# Patient Record
Sex: Female | Born: 1950 | Race: White | Hispanic: No | Smoking: Current every day smoker
Health system: Southern US, Community
[De-identification: ages and names within clinical notes are randomized; demographics above are authoritative.]

## PROBLEM LIST (undated history)

## (undated) DIAGNOSIS — M75100 Unspecified rotator cuff tear or rupture of unspecified shoulder, not specified as traumatic: Secondary | ICD-10-CM

## (undated) DIAGNOSIS — K5090 Crohn's disease, unspecified, without complications: Secondary | ICD-10-CM

## (undated) DIAGNOSIS — M75122 Complete rotator cuff tear or rupture of left shoulder, not specified as traumatic: Secondary | ICD-10-CM

## (undated) DIAGNOSIS — R3129 Other microscopic hematuria: Secondary | ICD-10-CM

## (undated) DIAGNOSIS — K2980 Duodenitis without bleeding: Secondary | ICD-10-CM

## (undated) DIAGNOSIS — F419 Anxiety disorder, unspecified: Secondary | ICD-10-CM

## (undated) DIAGNOSIS — R32 Unspecified urinary incontinence: Secondary | ICD-10-CM

## (undated) DIAGNOSIS — I509 Heart failure, unspecified: Secondary | ICD-10-CM

## (undated) DIAGNOSIS — J441 Chronic obstructive pulmonary disease with (acute) exacerbation: Secondary | ICD-10-CM

## (undated) DIAGNOSIS — M1990 Unspecified osteoarthritis, unspecified site: Secondary | ICD-10-CM

## (undated) DIAGNOSIS — K602 Anal fissure, unspecified: Secondary | ICD-10-CM

## (undated) DIAGNOSIS — Z9289 Personal history of other medical treatment: Secondary | ICD-10-CM

## (undated) DIAGNOSIS — D649 Anemia, unspecified: Secondary | ICD-10-CM

## (undated) DIAGNOSIS — C182 Malignant neoplasm of ascending colon: Secondary | ICD-10-CM

## (undated) DIAGNOSIS — K219 Gastro-esophageal reflux disease without esophagitis: Secondary | ICD-10-CM

## (undated) DIAGNOSIS — K56609 Unspecified intestinal obstruction, unspecified as to partial versus complete obstruction: Secondary | ICD-10-CM

## (undated) DIAGNOSIS — E785 Hyperlipidemia, unspecified: Secondary | ICD-10-CM

## (undated) DIAGNOSIS — M545 Low back pain, unspecified: Secondary | ICD-10-CM

## (undated) DIAGNOSIS — I10 Essential (primary) hypertension: Secondary | ICD-10-CM

## (undated) DIAGNOSIS — J439 Emphysema, unspecified: Secondary | ICD-10-CM

## (undated) DIAGNOSIS — T7840XA Allergy, unspecified, initial encounter: Secondary | ICD-10-CM

## (undated) DIAGNOSIS — I5181 Takotsubo syndrome: Secondary | ICD-10-CM

## (undated) HISTORY — PX: ECTOPIC PREGNANCY SURGERY: SHX613

## (undated) HISTORY — DX: Gastro-esophageal reflux disease without esophagitis: K21.9

## (undated) HISTORY — PX: BLADDER SUSPENSION: SHX72

## (undated) HISTORY — DX: Anemia, unspecified: D64.9

## (undated) HISTORY — DX: Heart failure, unspecified: I50.9

## (undated) HISTORY — DX: Emphysema, unspecified: J43.9

## (undated) HISTORY — DX: Anal fissure, unspecified: K60.2

## (undated) HISTORY — DX: Low back pain: M54.5

## (undated) HISTORY — DX: Personal history of other medical treatment: Z92.89

## (undated) HISTORY — PX: ESOPHAGOGASTRODUODENOSCOPY: SHX1529

## (undated) HISTORY — DX: Unspecified osteoarthritis, unspecified site: M19.90

## (undated) HISTORY — DX: Unspecified intestinal obstruction, unspecified as to partial versus complete obstruction: K56.609

## (undated) HISTORY — DX: Allergy, unspecified, initial encounter: T78.40XA

## (undated) HISTORY — DX: Takotsubo syndrome: I51.81

## (undated) HISTORY — DX: Unspecified urinary incontinence: R32

## (undated) HISTORY — PX: FACIAL COSMETIC SURGERY: SHX629

## (undated) HISTORY — DX: Other microscopic hematuria: R31.29

## (undated) HISTORY — PX: UTERINE SUSPENSION: SUR1430

## (undated) HISTORY — DX: Essential (primary) hypertension: I10

## (undated) HISTORY — DX: Crohn's disease, unspecified, without complications: K50.90

## (undated) HISTORY — DX: Anxiety disorder, unspecified: F41.9

## (undated) HISTORY — DX: Hyperlipidemia, unspecified: E78.5

## (undated) HISTORY — DX: Low back pain, unspecified: M54.50

## (undated) HISTORY — PX: ORBITAL FRACTURE SURGERY: SHX725

---
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
## 1979-04-04 HISTORY — PX: KNEE SURGERY: SHX244

## 1988-04-03 HISTORY — PX: ORBITAL FRACTURE SURGERY: SHX725

## 1989-04-03 HISTORY — PX: HAND SURGERY: SHX662

## 1992-04-03 HISTORY — PX: ABDOMINAL HYSTERECTOMY: SHX81

## 1993-04-03 HISTORY — PX: CHOLECYSTECTOMY: SHX55

## 2003-06-05 ENCOUNTER — Encounter: Payer: Self-pay | Admitting: Internal Medicine

## 2003-06-05 ENCOUNTER — Encounter: Admission: RE | Admit: 2003-06-05 | Discharge: 2003-06-05 | Payer: Self-pay | Admitting: Family Medicine

## 2003-10-19 ENCOUNTER — Inpatient Hospital Stay (HOSPITAL_COMMUNITY): Admission: EM | Admit: 2003-10-19 | Discharge: 2003-10-22 | Payer: Self-pay | Admitting: Emergency Medicine

## 2008-04-03 DIAGNOSIS — I219 Acute myocardial infarction, unspecified: Secondary | ICD-10-CM

## 2008-04-03 HISTORY — PX: LYSIS OF ADHESION: SHX5961

## 2008-04-03 HISTORY — DX: Acute myocardial infarction, unspecified: I21.9

## 2008-04-09 ENCOUNTER — Ambulatory Visit: Payer: Self-pay | Admitting: Internal Medicine

## 2008-04-22 ENCOUNTER — Inpatient Hospital Stay (HOSPITAL_COMMUNITY): Admission: EM | Admit: 2008-04-22 | Discharge: 2008-04-29 | Payer: Self-pay | Admitting: Emergency Medicine

## 2008-12-02 DIAGNOSIS — I5181 Takotsubo syndrome: Secondary | ICD-10-CM

## 2008-12-02 HISTORY — DX: Takotsubo syndrome: I51.81

## 2009-01-13 ENCOUNTER — Ambulatory Visit: Payer: Self-pay | Admitting: Internal Medicine

## 2009-01-27 ENCOUNTER — Ambulatory Visit: Payer: Self-pay | Admitting: Internal Medicine

## 2009-01-27 ENCOUNTER — Encounter: Payer: Self-pay | Admitting: Internal Medicine

## 2009-01-29 ENCOUNTER — Telehealth: Payer: Self-pay | Admitting: Internal Medicine

## 2009-02-01 ENCOUNTER — Ambulatory Visit: Payer: Self-pay | Admitting: Internal Medicine

## 2009-02-02 ENCOUNTER — Ambulatory Visit: Payer: Self-pay | Admitting: Internal Medicine

## 2009-02-02 ENCOUNTER — Telehealth: Payer: Self-pay | Admitting: Internal Medicine

## 2009-02-02 ENCOUNTER — Encounter: Payer: Self-pay | Admitting: Internal Medicine

## 2009-02-03 ENCOUNTER — Telehealth: Payer: Self-pay | Admitting: Internal Medicine

## 2009-02-03 LAB — CONVERTED CEMR LAB
ALT: 10 units/L (ref 0–35)
AST: 18 units/L (ref 0–37)
Albumin: 4 g/dL (ref 3.5–5.2)
Alkaline Phosphatase: 69 units/L (ref 39–117)
BUN: 10 mg/dL (ref 6–23)
Basophils Absolute: 0 10*3/uL (ref 0.0–0.1)
Basophils Relative: 0.1 % (ref 0.0–3.0)
CEA: 5.1 ng/mL — ABNORMAL HIGH (ref 0.0–5.0)
CO2: 29 meq/L (ref 19–32)
Calcium: 9.4 mg/dL (ref 8.4–10.5)
Chloride: 108 meq/L (ref 96–112)
Creatinine, Ser: 0.8 mg/dL (ref 0.4–1.2)
Eosinophils Absolute: 0.1 10*3/uL (ref 0.0–0.7)
Eosinophils Relative: 1 % (ref 0.0–5.0)
GFR calc non Af Amer: 78.06 mL/min (ref >60)
Glucose, Bld: 108 mg/dL — ABNORMAL HIGH (ref 70–99)
HCT: 39.7 % (ref 36.0–46.0)
Hemoglobin: 13.7 g/dL (ref 12.0–15.0)
Lymphocytes Relative: 25 % (ref 12.0–46.0)
Lymphs Abs: 1.8 10*3/uL (ref 0.7–4.0)
MCHC: 34.6 g/dL (ref 30.0–36.0)
MCV: 94.3 fL (ref 78.0–100.0)
Monocytes Absolute: 0.5 10*3/uL (ref 0.1–1.0)
Monocytes Relative: 7.4 % (ref 3.0–12.0)
Neutro Abs: 4.7 10*3/uL (ref 1.4–7.7)
Neutrophils Relative %: 66.5 % (ref 43.0–77.0)
Platelets: 218 10*3/uL (ref 150.0–400.0)
Potassium: 4.8 meq/L (ref 3.5–5.1)
RBC: 4.21 M/uL (ref 3.87–5.11)
RDW: 12.7 % (ref 11.5–14.6)
Sodium: 142 meq/L (ref 135–145)
Total Bilirubin: 0.5 mg/dL (ref 0.3–1.2)
Total Protein: 6.8 g/dL (ref 6.0–8.3)
WBC: 7.1 10*3/uL (ref 4.5–10.5)

## 2009-02-10 ENCOUNTER — Encounter (INDEPENDENT_AMBULATORY_CARE_PROVIDER_SITE_OTHER): Payer: Self-pay | Admitting: Internal Medicine

## 2009-02-10 ENCOUNTER — Inpatient Hospital Stay (HOSPITAL_COMMUNITY): Admission: EM | Admit: 2009-02-10 | Discharge: 2009-02-13 | Payer: Self-pay | Admitting: Emergency Medicine

## 2009-02-10 ENCOUNTER — Ambulatory Visit: Payer: Self-pay | Admitting: Infectious Diseases

## 2009-02-11 ENCOUNTER — Encounter: Payer: Self-pay | Admitting: Internal Medicine

## 2009-02-12 ENCOUNTER — Encounter: Payer: Self-pay | Admitting: Internal Medicine

## 2009-02-12 DIAGNOSIS — I5181 Takotsubo syndrome: Secondary | ICD-10-CM | POA: Insufficient documentation

## 2009-02-18 ENCOUNTER — Encounter: Payer: Self-pay | Admitting: Internal Medicine

## 2009-03-02 ENCOUNTER — Encounter: Payer: Self-pay | Admitting: Internal Medicine

## 2009-03-02 LAB — CONVERTED CEMR LAB
ALT: 15 units/L
AST: 17 units/L
Albumin: 3.8 g/dL
Alkaline Phosphatase: 66 units/L
BUN: 14 mg/dL
Basophils Relative: 1 %
CO2: 28 meq/L
Calcium: 9.6 mg/dL
Chloride: 107 meq/L
Creatinine, Ser: 0.8 mg/dL
Eosinophils Relative: 2 %
Glucose, Bld: 94 mg/dL
HCT: 38 %
Hemoglobin: 12.6 g/dL
Lymphocytes, automated: 36 %
MCV: 92.3 fL
Monocytes Relative: 8 %
Neutrophils Relative %: 54 %
Platelets: 217 10*3/uL
Potassium: 4.1 meq/L
RBC: 4.12 M/uL
RDW: 12.8 %
Sodium: 141 meq/L
Total Bilirubin: 0.5 mg/dL
Total Protein: 6.8 g/dL
WBC: 7.3 10*3/uL

## 2009-03-03 ENCOUNTER — Ambulatory Visit: Payer: Self-pay | Admitting: Internal Medicine

## 2009-03-03 ENCOUNTER — Encounter: Payer: Self-pay | Admitting: Internal Medicine

## 2009-03-03 HISTORY — PX: RIGHT COLECTOMY: SHX853

## 2009-03-03 LAB — CONVERTED CEMR LAB
BUN: 14 mg/dL (ref 6–23)
CO2: 24 meq/L (ref 19–32)
Calcium: 9.3 mg/dL (ref 8.4–10.5)
Chloride: 106 meq/L (ref 96–112)
Creatinine, Ser: 0.8 mg/dL (ref 0.40–1.20)
Glucose, Bld: 92 mg/dL (ref 70–99)
Potassium: 4.7 meq/L (ref 3.5–5.3)
Sodium: 141 meq/L (ref 135–145)

## 2009-03-05 ENCOUNTER — Encounter (INDEPENDENT_AMBULATORY_CARE_PROVIDER_SITE_OTHER): Payer: Self-pay | Admitting: General Surgery

## 2009-03-05 ENCOUNTER — Inpatient Hospital Stay (HOSPITAL_COMMUNITY): Admission: RE | Admit: 2009-03-05 | Discharge: 2009-03-08 | Payer: Self-pay | Admitting: General Surgery

## 2009-03-05 ENCOUNTER — Encounter (INDEPENDENT_AMBULATORY_CARE_PROVIDER_SITE_OTHER): Payer: Self-pay | Admitting: *Deleted

## 2009-03-08 ENCOUNTER — Encounter (INDEPENDENT_AMBULATORY_CARE_PROVIDER_SITE_OTHER): Payer: Self-pay | Admitting: *Deleted

## 2009-03-10 ENCOUNTER — Inpatient Hospital Stay (HOSPITAL_COMMUNITY): Admission: EM | Admit: 2009-03-10 | Discharge: 2009-03-12 | Payer: Self-pay | Admitting: Emergency Medicine

## 2009-03-13 ENCOUNTER — Inpatient Hospital Stay (HOSPITAL_COMMUNITY): Admission: EM | Admit: 2009-03-13 | Discharge: 2009-03-18 | Payer: Self-pay | Admitting: Emergency Medicine

## 2009-03-18 ENCOUNTER — Encounter (INDEPENDENT_AMBULATORY_CARE_PROVIDER_SITE_OTHER): Payer: Self-pay | Admitting: *Deleted

## 2009-03-19 ENCOUNTER — Encounter (INDEPENDENT_AMBULATORY_CARE_PROVIDER_SITE_OTHER): Payer: Self-pay | Admitting: *Deleted

## 2009-03-19 ENCOUNTER — Encounter: Admission: RE | Admit: 2009-03-19 | Discharge: 2009-03-19 | Payer: Self-pay | Admitting: General Surgery

## 2009-03-19 ENCOUNTER — Encounter: Payer: Self-pay | Admitting: Internal Medicine

## 2009-03-19 ENCOUNTER — Inpatient Hospital Stay (HOSPITAL_COMMUNITY): Admission: AD | Admit: 2009-03-19 | Discharge: 2009-03-23 | Payer: Self-pay | Admitting: General Surgery

## 2009-03-19 LAB — CONVERTED CEMR LAB
ALT: 86 units/L
AST: 21 units/L
Albumin: 3.5 g/dL
Alkaline Phosphatase: 103 units/L
BUN: 3 mg/dL
CO2: 28 meq/L
Calcium: 9 mg/dL
Chloride: 101 meq/L
Creatinine, Ser: 0.92 mg/dL
Glucose, Bld: 106 mg/dL
HCT: 35.3 %
Hemoglobin: 11.5 g/dL
MCV: 91.4 fL
Platelets: 520 10*3/uL
Potassium: 3.1 meq/L
RBC: 3.86 M/uL
RDW: 12.6 %
Sodium: 139 meq/L
Total Bilirubin: 0.1 mg/dL
Total Protein: 6.6 g/dL
WBC: 7.9 10*3/uL

## 2009-03-23 ENCOUNTER — Encounter (INDEPENDENT_AMBULATORY_CARE_PROVIDER_SITE_OTHER): Payer: Self-pay | Admitting: *Deleted

## 2009-03-23 ENCOUNTER — Telehealth (INDEPENDENT_AMBULATORY_CARE_PROVIDER_SITE_OTHER): Payer: Self-pay | Admitting: *Deleted

## 2009-04-01 ENCOUNTER — Ambulatory Visit: Payer: Self-pay | Admitting: Genetic Counselor

## 2009-04-14 ENCOUNTER — Encounter: Payer: Self-pay | Admitting: Internal Medicine

## 2009-04-19 ENCOUNTER — Telehealth: Payer: Self-pay | Admitting: Internal Medicine

## 2009-04-21 ENCOUNTER — Telehealth: Payer: Self-pay | Admitting: Internal Medicine

## 2009-06-22 ENCOUNTER — Ambulatory Visit: Payer: Self-pay | Admitting: Genetic Counselor

## 2010-01-05 ENCOUNTER — Encounter: Payer: Self-pay | Admitting: Internal Medicine

## 2010-01-11 ENCOUNTER — Encounter (INDEPENDENT_AMBULATORY_CARE_PROVIDER_SITE_OTHER): Payer: Self-pay | Admitting: *Deleted

## 2010-01-14 ENCOUNTER — Ambulatory Visit: Payer: Self-pay | Admitting: Internal Medicine

## 2010-01-17 ENCOUNTER — Encounter (INDEPENDENT_AMBULATORY_CARE_PROVIDER_SITE_OTHER): Payer: Self-pay | Admitting: *Deleted

## 2010-01-25 ENCOUNTER — Ambulatory Visit: Payer: Self-pay | Admitting: Internal Medicine

## 2010-01-25 LAB — HM COLONOSCOPY

## 2010-01-31 ENCOUNTER — Telehealth: Payer: Self-pay | Admitting: Internal Medicine

## 2010-02-01 DIAGNOSIS — K5090 Crohn's disease, unspecified, without complications: Secondary | ICD-10-CM

## 2010-02-01 HISTORY — DX: Crohn's disease, unspecified, without complications: K50.90

## 2010-02-03 ENCOUNTER — Telehealth: Payer: Self-pay | Admitting: Internal Medicine

## 2010-02-04 ENCOUNTER — Encounter: Payer: Self-pay | Admitting: Internal Medicine

## 2010-02-04 LAB — HM MAMMOGRAPHY: HM Mammogram: NORMAL

## 2010-02-14 ENCOUNTER — Encounter: Payer: Self-pay | Admitting: Internal Medicine

## 2010-02-14 DIAGNOSIS — I10 Essential (primary) hypertension: Secondary | ICD-10-CM | POA: Insufficient documentation

## 2010-02-16 ENCOUNTER — Ambulatory Visit: Payer: Self-pay | Admitting: Internal Medicine

## 2010-02-16 DIAGNOSIS — M545 Low back pain, unspecified: Secondary | ICD-10-CM | POA: Insufficient documentation

## 2010-02-16 DIAGNOSIS — M25569 Pain in unspecified knee: Secondary | ICD-10-CM | POA: Insufficient documentation

## 2010-02-16 DIAGNOSIS — I252 Old myocardial infarction: Secondary | ICD-10-CM | POA: Insufficient documentation

## 2010-02-16 DIAGNOSIS — K219 Gastro-esophageal reflux disease without esophagitis: Secondary | ICD-10-CM | POA: Insufficient documentation

## 2010-02-16 DIAGNOSIS — R32 Unspecified urinary incontinence: Secondary | ICD-10-CM | POA: Insufficient documentation

## 2010-02-16 DIAGNOSIS — M1990 Unspecified osteoarthritis, unspecified site: Secondary | ICD-10-CM | POA: Insufficient documentation

## 2010-02-16 DIAGNOSIS — Z860100 Personal history of colon polyps, unspecified: Secondary | ICD-10-CM | POA: Insufficient documentation

## 2010-02-16 DIAGNOSIS — E785 Hyperlipidemia, unspecified: Secondary | ICD-10-CM | POA: Insufficient documentation

## 2010-02-16 DIAGNOSIS — D649 Anemia, unspecified: Secondary | ICD-10-CM | POA: Insufficient documentation

## 2010-02-16 DIAGNOSIS — Z87442 Personal history of urinary calculi: Secondary | ICD-10-CM | POA: Insufficient documentation

## 2010-02-17 LAB — CONVERTED CEMR LAB
BUN: 23 mg/dL (ref 6–23)
Basophils Absolute: 0.1 10*3/uL (ref 0.0–0.1)
Basophils Relative: 0.7 % (ref 0.0–3.0)
CO2: 28 meq/L (ref 19–32)
Calcium: 10 mg/dL (ref 8.4–10.5)
Chloride: 108 meq/L (ref 96–112)
Cholesterol: 225 mg/dL — ABNORMAL HIGH (ref 0–200)
Creatinine, Ser: 1.1 mg/dL (ref 0.4–1.2)
Direct LDL: 147.5 mg/dL
Eosinophils Absolute: 0.2 10*3/uL (ref 0.0–0.7)
Eosinophils Relative: 2.5 % (ref 0.0–5.0)
Folate: 12 ng/mL
GFR calc non Af Amer: 51.69 mL/min (ref >60)
Glucose, Bld: 102 mg/dL — ABNORMAL HIGH (ref 70–99)
HCT: 36.5 % (ref 36.0–46.0)
HDL: 53.4 mg/dL (ref >39.00)
Hemoglobin: 12.5 g/dL (ref 12.0–15.0)
Iron: 62 ug/dL (ref 42–145)
Lymphocytes Relative: 28 % (ref 12.0–46.0)
Lymphs Abs: 2.7 10*3/uL (ref 0.7–4.0)
MCHC: 34.1 g/dL (ref 30.0–36.0)
MCV: 91.7 fL (ref 78.0–100.0)
Monocytes Absolute: 0.7 10*3/uL (ref 0.1–1.0)
Monocytes Relative: 7.2 % (ref 3.0–12.0)
Neutro Abs: 5.8 10*3/uL (ref 1.4–7.7)
Neutrophils Relative %: 61.6 % (ref 43.0–77.0)
Platelets: 262 10*3/uL (ref 150.0–400.0)
Potassium: 5.3 meq/L — ABNORMAL HIGH (ref 3.5–5.1)
RBC: 3.99 M/uL (ref 3.87–5.11)
RDW: 13.2 % (ref 11.5–14.6)
Saturation Ratios: 16.2 % — ABNORMAL LOW (ref 20.0–50.0)
Sodium: 144 meq/L (ref 135–145)
Total CHOL/HDL Ratio: 4
Transferrin: 273.6 mg/dL (ref 212.0–360.0)
Triglycerides: 171 mg/dL — ABNORMAL HIGH (ref 0.0–149.0)
VLDL: 34.2 mg/dL (ref 0.0–40.0)
Vitamin B-12: 300 pg/mL (ref 211–911)
WBC: 9.5 10*3/uL (ref 4.5–10.5)

## 2010-02-18 ENCOUNTER — Ambulatory Visit: Payer: Self-pay | Admitting: Internal Medicine

## 2010-02-18 DIAGNOSIS — C182 Malignant neoplasm of ascending colon: Secondary | ICD-10-CM

## 2010-02-18 DIAGNOSIS — Z85038 Personal history of other malignant neoplasm of large intestine: Secondary | ICD-10-CM | POA: Insufficient documentation

## 2010-02-18 HISTORY — DX: Malignant neoplasm of ascending colon: C18.2

## 2010-02-28 ENCOUNTER — Ambulatory Visit: Payer: Self-pay | Admitting: Internal Medicine

## 2010-03-03 HISTORY — PX: TOTAL KNEE ARTHROPLASTY: SHX125

## 2010-03-11 ENCOUNTER — Encounter: Payer: Self-pay | Admitting: Internal Medicine

## 2010-03-22 ENCOUNTER — Encounter: Payer: Self-pay | Admitting: Internal Medicine

## 2010-03-23 ENCOUNTER — Ambulatory Visit: Payer: Self-pay | Admitting: Internal Medicine

## 2010-03-23 ENCOUNTER — Encounter: Payer: Self-pay | Admitting: Internal Medicine

## 2010-03-29 ENCOUNTER — Inpatient Hospital Stay (HOSPITAL_COMMUNITY)
Admission: RE | Admit: 2010-03-29 | Discharge: 2010-03-30 | Payer: Self-pay | Source: Home / Self Care | Attending: Orthopedic Surgery | Admitting: Orthopedic Surgery

## 2010-04-29 ENCOUNTER — Ambulatory Visit
Admission: RE | Admit: 2010-04-29 | Discharge: 2010-04-29 | Payer: Self-pay | Source: Home / Self Care | Attending: Internal Medicine | Admitting: Internal Medicine

## 2010-05-02 ENCOUNTER — Telehealth (INDEPENDENT_AMBULATORY_CARE_PROVIDER_SITE_OTHER): Payer: Self-pay

## 2010-05-03 NOTE — Procedures (Signed)
Summary: Colonoscopy  Patient: Erica [name] Note: All result statuses are Final unless otherwise noted.  Tests: (1) Colonoscopy (COL)   COL Colonoscopy           [facility] [employer] [facility] [employer].     [place], [place]  [address]           COLONOSCOPY PROCEDURE REPORT           PATIENT:  Erica, [name]  MR#:  [id number]     BIRTHDATE:  [birth year], 74 yrs. old  GENDER:  female     ENDOSCOPIST:  Gatha Mayer, MD, [facility]     REF. BY:     PROCEDURE DATE:  01/25/2010     PROCEDURE:  Colonoscopy with biopsy and snare polypectomy     ASA CLASS:  Class II     INDICATIONS:  surveillance and high-risk screening, history of     colon cancer, family history of colon cancer, family Hx of polyps     T1 N0 adenocarcinoma of right colon resected 03/2009     sister with colon cancer (50's) father with colon cancer (86's)           personal hx of polyps     MEDICATIONS:   Fentanyl 75 mcg IV, Versed 7 mg           DESCRIPTION OF PROCEDURE:   After the risks benefits and     alternatives of the procedure were thoroughly explained, informed     consent was obtained.  Digital rectal exam was performed and     revealed no abnormalities.   The LB 180AL [id number] and LB     PCF-H180AL [id number] endoscope was introduced through the anus and     advanced to the ileum, without limitations.  The quality of the     prep was excellent, using MoviPrep.  The instrument was then     slowly withdrawn as the colon was fully examined. Insertion: 2:42     minutes withdrawal: 11:32 minutes     <<PROCEDUREIMAGES>>           FINDINGS:  A diminutive polyp was found at the splenic flexure. It     was 4 - 5 mm in size. Polyp was snared without cautery. Retrieval     was successful. snare polyp  Ulcers in the ileum. Small,     serpiginous ulcers seen - multiple. Multiple biopsies were     obtained and sent to pathology. Ulcers in the sigmoid colon.     Multiple tiny ulcers. Multiple biopsies were  obtained and sent to     pathology.  This was otherwise a normal examination of the colon.     Retroflexed views in the rectum revealed internal hemorrhoids.     The scope was then withdrawn from the patient and the procedure     completed.           COMPLICATIONS:  None     ENDOSCOPIC IMPRESSION:     1) 4 - 5 mm diminutive polyp at the splenic flexure - removed     2) Ulcers in the ileum - ? Crohn's - biopsied     3) Ulcers in the sigmoid colon - ? Crohn's vs. prep effect -     biopsied     4) Internal hemorrhoids     5) Otherwise normal examination     6) Personal history of T1N0 colon cancer resected  03/2009 + family     hx colon cancer sister and father     RECOMMENDATIONS:     1) Await biopsy results     I will call her with results.     Also need to look into genetics testing (started last year, ? if     completed).           REPEAT EXAM:  In for Colonoscopy, pending biopsy results.           Gatha Mayer, MD, [facility]           CC:  Lavone Orn, MD     Excell Seltzer, MD     The Patient           n.     eSIGNED:   Gatha Mayer at 01/25/2010 02:52 PM           [name], [id number]  Note: An exclamation mark (!) indicates a result that was not dispersed into the flowsheet. Document Creation Date: 01/25/2010 2:53 PM _______________________________________________________________________  (1) Order result status: Final Collection or observation date-time: 01/25/2010 14:35 Requested date-time:  Receipt date-time:  Reported date-time:  Referring Physician:   Ordering Physician: Silvano Rusk [phone]) Specimen Source:  Source: Tawanna Cooler Order Number: [id number] Lab site:   Appended Document: Colonoscopy     Procedures Next Due Date:    Colonoscopy: 02/2012

## 2010-05-03 NOTE — Letter (Signed)
Summary: [facility]   Imported By: Phillis Knack 05/03/2009 10:03:23  _____________________________________________________________________  External Attachment:    Type:   Image     Comment:   External Document

## 2010-05-03 NOTE — Letter (Signed)
Summary: Pre Visit Letter Revised  [facility]  [place],  [place] [address]   Phone: [phone]  Fax: [phone]        01/05/2010 MRN: [id number]  [facility] [address] [place],   [address]             Procedure Date:  10-25 at 2pm   Welcome to the Gastroenterology Division at [facility].    You are scheduled to see a nurse for your pre-procedure visit on 01-14-10 at 9am on the 3rd floor at [facility], [place] [employer].  We ask that you try to arrive at our office 15 minutes prior to your appointment time to allow for check-in.  Please take a minute to review the attached form.  If you answer "Yes" to one or more of the questions on the first page, we ask that you call the person listed at your earliest opportunity.  If you answer "No" to all of the questions, please complete the rest of the form and bring it to your appointment.    Your nurse visit will consist of discussing your medical and surgical history, your immediate family medical history, and your medications.   If you are unable to list all of your medications on the form, please bring the medication bottles to your appointment and we will list them.  We will need to be aware of both prescribed and over the counter drugs.  We will need to know exact dosage information as well.    Please be prepared to read and sign documents such as consent forms, a financial agreement, and acknowledgement forms.  If necessary, and with your consent, a friend or relative is welcome to sit-in on the nurse visit with you.  Please bring your insurance card so that we may make a copy of it.  If your insurance requires a referral to see a specialist, please bring your referral form from your primary care physician.  No co-pay is required for this nurse visit.     If you cannot keep your appointment, please call [phone] to cancel or reschedule prior to your appointment date.  This  allows [place] the opportunity to schedule an appointment for another patient in need of care.    Thank you for choosing [facility] for your medical needs.  We appreciate the opportunity to care for you.  Please visit [place] at our website  to learn more about our practice.  Sincerely, The Gastroenterology Division

## 2010-05-03 NOTE — Letter (Signed)
Summary: [facility]   Imported By: Phillis Knack 03/05/2009 11:21:47  _____________________________________________________________________  External Attachment:    Type:   Image     Comment:   External Document

## 2010-05-03 NOTE — Assessment & Plan Note (Signed)
Summary: New to establish/[employer]/#/cd   Vital Signs:  Patient profile:   60 year old female Height:      63 inches (160.02 cm) Weight:      163.12 pounds (74.15 kg) BMI:     29.00 O2 Sat:      94 % on Room air Temp:     97.1 degrees F (36.17 degrees C) oral Pulse rate:   84 / minute BP sitting:   118 / 72  (left arm) Cuff size:   regular  Vitals Entered By: Tomma Lightning RMA (February 16, 2010 3:01 PM)  O2 Flow:  Room air CC: New patient Is Patient Diabetic? No Pain Assessment Patient in pain? no        Primary Care Provider:  Rowe Clack MD  CC:  New patient.  History of Present Illness: new pt to me and our division, here to est care prev followed at int med OPC/teaching service  1) colon cancer - dx 03/2009 and s/p r hemicoloectomy for same - multiple SBO prior to this time  2) crohns - recent dx 02/2010 due to ulcerations (also NSAID induced in DDx) - follows with GI for same - recently begun entercort and symptoms unchanged - cont diffuse abd pain and bloating - planning f/u GI next 48h to review options for tx  3) HTN - reports compliance with ongoing medical treatment and no changes in medication dose or frequency. denies adverse side effects related to current therapy. 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  Caffeine-Diet-Exercise     Caffeine use/day: 9-10 cups of  coffee/day  Safety-Violence-Falls     Seat Belt Counseling: not indicated; patient wears seat belts     Firearm Counseling: not indicated; uses recommended firearm safety measures     Violence Counseling: not applicable     Fall Risk Counseling: not indicated; no significant falls noted  Clinical Review Panels:  Prevention   Last Mammogram:  Done @ [facility] No specific mammographic evidence of malignancy.  Assessment: BIRADS 1. (02/04/2010)   Last Colonoscopy:  DONE (01/25/2010)  Immunizations   Last Tetanus Booster:  Historical (04/03/2004)   Last Pneumovax:  Historical (04/03/2008)  CBC   WBC:  7.9 (03/19/2009)   RBC:  3.86 (03/19/2009)   Hgb:  11.5 (03/19/2009)   Hct:  35.3 (03/19/2009)   Platelets:  520 (03/19/2009)   MCV  91.4 (03/19/2009)   MCHC  34.6 (02/01/2009)   RDW  12.6 (03/19/2009)   PMN:  54 (03/02/2009)   Lymphs:  25.0 (02/01/2009)   Monos:  8 (03/02/2009)   Eosinophils:  2 (03/02/2009)   Basophil:  1 (03/02/2009)  Complete Metabolic Panel   Glucose:  106 (03/19/2009)   Sodium:  139 (03/19/2009)   Potassium:  3.1 (03/19/2009)   Chloride:  101 (03/19/2009)   CO2:  28 (03/19/2009)   BUN:  3 (03/19/2009)   Creatinine:  0.92 (03/19/2009)   Albumin:  3.5 (03/19/2009)   Total Protein:  6.6 (03/19/2009)   Calcium:  9.0 (03/19/2009)   Total Bili:  0.1 (03/19/2009)   Alk Phos:  103 (03/19/2009)   SGPT (ALT):  86 (03/19/2009)   SGOT (AST):  21 (03/19/2009)   Current Medications (verified): 1)  Toprol Xl 25 Mg Xr24h-Tab (Metoprolol Succinate) .... Take 1 Tablet By Mouth Once A Day 2)  Lisinopril 2.5 Mg Tabs (Lisinopril) .... Take 1 Tablet By Mouth Once A Day 3)  Entocort Ec 3 Mg  Cp24 (Budesonide) .... Take 3 Capsules Daily  Allergies (verified): 1)  ! 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         otherwise, see HPI above. I have reviewed all other systems and they were negative.   Physical Exam  General:  alert, well-developed, well-nourished, and cooperative to examination.    Head:  Normocephalic and atraumatic without obvious abnormalities. No apparent alopecia or balding. Eyes:  vision grossly intact; pupils equal, round and reactive to light.  conjunctiva and lids normal.    Neck:  supple, full ROM, no masses, no thyromegaly; no thyroid nodules or tenderness. no JVD or carotid bruits.   Lungs:  normal respiratory effort, no intercostal retractions or use of accessory muscles; normal breath sounds bilaterally - no crackles and no wheezes.    Heart:  normal rate, regular rhythm, no murmur, and no rub. BLE without edema. normal DP pulses and normal cap refill in all 4 extremities    Abdomen:  soft, non-tender, normal bowel sounds, no distention; no masses and no appreciable hepatomegaly or sple.abd nomegaly.   Msk:  R knee: decreased range of  motion, diffuse boggy synovitis. Tender to palpation on joint line. Increased pain with weight bearing. Positive crepitus.  Neurologic:  alert & oriented X3 and cranial nerves II-XII symetrically intact.  strength normal in all extremities, sensation intact to light touch, and gait normal. speech fluent without dysarthria or aphasia; follows commands with good comprehension.  Skin:  no rashes, vesicles, ulcers, or erythema. No nodules or irregularity to palpation.  Psych:  Oriented X3, memory intact for recent and remote, normally interactive, good eye contact, not anxious appearing, not depressed appearing, and not agitated.      Impression & Recommendations:  Problem # 1:  OSTEOARTHRITIS (ICD-715.90)  low back followed by ortho spine - pt declines "pain pills" and  surg, considering injections has been advised by ortho to seek opinion from rheum, esp with knee pain symptoms -  will refer to rheum and tx low dose meloxicam pending this eval also xray right knee today Her updated medication list for this problem includes:    Meloxicam 7.5 Mg Tabs (Meloxicam) .[name]... 1 by mouth once daily as needed for pain  Discussed use of medications, application of heat or cold, and exercises.   Orders: Prescription Created Electronically [phone])  Problem # 2:  BACK PAIN, LUMBAR (ICD-724.2) follows with ortho for same -  Her updated medication list for this problem includes:    Meloxicam 7.5 Mg Tabs (Meloxicam) .[name]... 1 by mouth once daily as needed for pain  Orders: Rheumatology Referral (Rheumatology) Prescription Created Electronically [phone])  Problem # 3:  KNEE PAIN, RIGHT (ICD-719.46)  Her updated medication list for this problem includes:    Meloxicam 7.5 Mg Tabs (Meloxicam) .[name]... 1 by mouth once daily as needed for pain  Orders: T-Knee Comp Right 4 Views [phone]) Rheumatology Referral (Rheumatology) Prescription Created Electronically [phone])  Problem # 4:  HYPERLIPIDEMIA  (ICD-272.4)  pt not taking statin since 03/2009 hosp - no adv rxn - just prefers not to take recheck FLP now -  The following medications were removed from the medication list:    Zocor 20 Mg Tabs (Simvastatin) .[name]... Take 1 by mouth once daily  Orders: TLB-Lipid Panel (80061-LIPID)  Labs Reviewed: SGOT: 21 (03/19/2009)   SGPT: 86 (03/19/2009)  Problem # 5:  HYPERTENSION (ICD-401.9)  Her updated medication list for this problem includes:    Toprol Xl 25 Mg Xr24h-tab (Metoprolol succinate) .[name]... Take 1 tablet by mouth once a day    Lisinopril 2.5 Mg Tabs (Lisinopril) .[name]... Take 1 tablet by mouth once a day  Orders: TLB-BMP (Basic Metabolic Panel-BMET) (99242-ASTMHDQ)  BP today: 118/72 Prior BP: 133/78 (03/03/2009)  Labs Reviewed: K+: 3.1 (03/19/2009) Creat: : 0.92 (03/19/2009)     Problem # 6:  ? 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 Take 1 tablet by mouth once a day 2)  Lisinopril 2.5 Mg Tabs (Lisinopril) .... Take 1 tablet by mouth once a day 3)  Entocort Ec 3 Mg Cp24 (Budesonide) .... Take 3 capsules daily 4)  Meloxicam 7.5 Mg Tabs (Meloxicam) .[name].. 1 by mouth once daily as needed for pain  Patient Instructions: 1)  it was good to see you today. 2)  medical history and medications reviewed 3)  try meoxicam for arthritis pain - your prescription has been electronically submitted to your pharmacy. Please take as directed. Contact our  office if you believe you're having problems with the medication(s).  4)  test(s) - xray and labs- ordered today - your results will be posted on the phone tree for review in 48-72 hours from the time of test completion; call [phone] and enter your 9 digit MRN (listed above on this page, just below your name); if any changes need to be made or there are abnormal results, you will  be contacted directly.  5)  we'll make referral to rheumatology for opinion on your arthritis. Our office will contact you regarding this appointment once made.  6)  Please schedule a follow-up appointment in 6 months to monitor choelstrol and weight and arthritis, call sooner if problems.  Prescriptions: MELOXICAM 7.5 MG TABS (MELOXICAM) 1 by mouth once daily as needed for pain  #30 x 1   Entered and Authorized by:   Rowe Clack MD   Signed by:   Rowe Clack MD on 02/16/2010   Method used:   Electronically to        [facility] [phone]* (retail)       [address]       [place], [place]  [address]       Ph: [phone]       Fax: [phone]   RxID:   [id number]    Orders Added: 1)  TLB-CBC Platelet - w/Differential [85025-CBCD] 2)  TLB-B12 + Folate Pnl [[id number]-B12/FOL] 3)  TLB-IBC Pnl (Iron/FE;Transferrin) [83550-IBC] 4)  TLB-Lipid Panel [80061-LIPID] 5)  TLB-BMP (Basic Metabolic Panel-BMET) [67341-PFXTKWI] 6)  T-Knee Comp Right 4 Views [73564TC] 7)  Rheumatology Referral [Rheumatology] 8)  New Patient Level IV [09735] 9)  Prescription Created Electronically [phone]   Immunization History:  Tetanus/Td Immunization History:    Tetanus/Td:  historical (04/03/2004)  Pneumovax Immunization History:    Pneumovax:  historical (04/03/2008)   Immunization History:  Tetanus/Td Immunization History:    Tetanus/Td:  Historical (04/03/2004)  Pneumovax Immunization History:    Pneumovax:  Historical (04/03/2008)    Mammogram  Procedure date:   02/04/2010  Findings:      No specific mammographic evidence of malignancy.   Done @ [facility]

## 2010-05-03 NOTE — Miscellaneous (Signed)
Summary: Lec previsit  Clinical Lists Changes  Medications: Added new medication of MOVIPREP 100 GM  SOLR (PEG-KCL-NACL-NASULF-NA ASC-C) As per prep instructions. - Signed Rx of MOVIPREP 100 GM  SOLR (PEG-KCL-NACL-NASULF-NA ASC-C) As per prep instructions.;  #1 x 0;  Signed;  Entered by: Cornelia Copa RN;  Authorized by: Gatha Mayer MD, [facility];  Method used: Electronically to [facility] [phone]*, [address], [place], [place], [place]  [address], Ph: [phone], Fax: [phone] Observations: Added new observation of ALLERGY REV: Done (01/13/2009 8:38)    Prescriptions: MOVIPREP 100 GM  SOLR (PEG-KCL-NACL-NASULF-NA ASC-C) As per prep instructions.  #1 x 0   Entered by:   Cornelia Copa RN   Authorized by:   Gatha Mayer MD, [facility]   Signed by:   Cornelia Copa RN on 01/13/2009   Method used:   Electronically to        [facility] [phone]* (retail)       [address]       [place], [place]  [address]       Ph: [phone]       Fax: [phone]   RxID:   [id number]

## 2010-05-03 NOTE — Miscellaneous (Signed)
Summary: H and P  INTERNAL MEDICINE ADMISSION HISTORY AND PHYSICAL First Contact: Doreene Burke [phone]) Second Contact: Carter Kitten [phone])  PCP:  Dr. Coralyn Mark, [facility]  CC: Chest pain  HPI:  Pt is a 60 yo F with PMH sig for recent diagnosis of colon cancer, osteoarthritis, and multiple SBO's s/p laparotomy for adgesion lysis who presented to her PCP today after having about 10 days of diffuse joint pain and left sided chest pain.  She says she has had joint pain in the past intermittently related to her OA and the onset this time was about 2 days after finding out about her colon cancer diagnosis.  She thinks the pain may be related to the stress surrounding this.  She has never had this chest pain before, however.  She describes it as sharp and located in her left chest and radiates to her back and neck.  She was told to only take tylenol after the colonoscopy which she did but it didn't help.  She eventually decided to take some Aleve which helped most of the pain except for the chest pain.  The chest pain is worse with movements of her arm and she thinks it also could be related to recent use of a leaf blower doing work in her yard.  The pain today is a 4/10 and at its worst was a 7/10 (yesterday).  She went to see her PCP today who gave her flexeril and checked an EKG which showed some changes compared to old EKG.  She was given ASA and NTG which did not really seem to help the pain.  She endorses some associated palpitations, but denies N/V or diaphoresis or SOB.  She is a smoker.  She denies cough.  ALLERGIES: NKDA  PAST MEDICAL HISTORY: 1) Right sided colon adenocarcinoma (diagnosed by biopsy from colonoscopy 10/10, no signs of mets on pelvic/abd CT) 2) h/o multiple SBO's s/p laparotomy for lysis of adhesions 3) s/p 3 C-sections '85 4) s/p hysterectomy '94 5) ectopic pregnancy s/p laparotomy '90 6) s/p lap cholecystectomy '93 7) osteoarthritis  8) s/p bladder suspension  MEDICATIONS: Aleve 272m QDay  Tylenol 2Tsp Q8hrs   SOCIAL HISTORY: Pt is retired, [place] to [place] [employer] Lives at home with her husband and son. Smokes 1ppd x 4110yr denies alcohol and other drugs.  FAMILY HISTORY F- colon cancer, renal cancer, pacemaker M- throat cancer S- colon cancer, amyloidosis  ROS: denies F/C, cough, bleeding  VITALS: T: 97.4 P: 63 BP: 103/68 R: 14 O2SAT: 96% ON: RA  PHYSICAL EXAM: General:  alert, well-developed, and cooperative to examination.   Head:  normocephalic and atraumatic.   Eyes:  vision grossly intact, pupils equal, pupils round, pupils reactive to light, no injection and anicteric.   Mouth:  pharynx pink and moist, no erythema, and no exudates.   Neck:  supple, full ROM, no thyromegaly, no JVD Lungs:  normal respiratory effort, no accessory muscle use, normal breath sounds, no crackles, and no wheezes.  ttp under left scapula, mild Heart:  normal rate, regular rhythm, no murmur, no gallop, and no rub.   Chest: not ttp over left chest Abdomen:  soft, non-tender, multiple surgical scars midline, normal bowel sounds, no distention, no guarding, no rebound tenderness, no hepatomegaly, and no splenomegaly.   Msk:  no joint swelling, no joint warmth, and no redness over joints.   Extremities:  No cyanosis, clubbing, edema  Neurologic:  alert & oriented X3, cranial  nerves II-XII intact, strength normal in all extremities, sensation intact to light touch  Psych:  Oriented X3, memory intact for recent and remote, normally interactive, good eye contact, not anxious appearing, and not depressed appearing.   LABS: WBC                                      8.7               4.0-10.5         K/uL  RBC                                      4.07              3.87-5.11        MIL/uL  Hemoglobin (HGB)                         13.1              12.0-15.0        g/dL   Hematocrit (HCT)                         37.6              36.0-46.0        %  MCV                                      92.3              78.0-100.0       fL  MCHC                                     34.7              30.0-36.0        g/dL  RDW                                      13.1              11.5-15.5        %  Platelet Count (PLT)                     227               150-400          K/uL  Neutrophils, %                           52                43-77            %  Lymphocytes, %                           37  12-46            %  Monocytes, %                             8                 3-12             %  Eosinophils, %                           3                 0-5              %  Basophils, %                             1                 0-1              %  Neutrophils, Absolute                    4.5               1.7-7.7          K/uL  Lymphocytes, Absolute                    3.2               0.7-4.0          K/uL  Monocytes, Absolute                      0.7               0.1-1.0          K/uL  Eosinophils, Absolute                    0.2               0.0-0.7          K/uL  Basophils, Absolute                      0.1               0.0-0.1          K/uL  TCO2                                     27                0-100            mmol/L  Ionized Calcium                          1.25              1.12-1.32        mmol/L  Hemoglobin (HGB)                         13.6  12.0-15.0        g/dL  Hematocrit (HCT)                         40.0              36.0-46.0        %  Sodium (NA)                              140               135-145          mEq/L  Potassium (K)                            4.3               3.5-5.1          mEq/L  Chloride                                 105               96-112           mEq/L  Glucose                                  89                70-99            mg/dL   BUN                                      13                6-23             mg/dL  Creatinine                               0.8               0.4-1.2          mg/dL  Sodium (NA)                              137               135-145          mEq/L  Potassium (K)                            4.2               3.5-5.1          mEq/L  Chloride                                 104               96-112  mEq/L  CO2                                      26                19-32            mEq/L  Glucose                                  93                70-99            mg/dL  BUN                                      9                 6-23             mg/dL  Creatinine                               0.77              0.4-1.2          mg/dL  GFR, Est Non African American            >60               >60              mL/min  GFR, Est African American                >60               >60              mL/min    Oversized comment, see footnote  1  Bilirubin, Total                         0.6               0.3-1.2          mg/dL  Alkaline Phosphatase                     64                39-117           U/L  SGOT (AST)                               15                0-37             U/L  SGPT (ALT)                               12                0-35             U/L  Total  Protein  6.4               6.0-8.3          g/dL  Albumin-Blood                            3.8               3.5-5.2          g/dL  Calcium                                  9.4               8.4-10.5         mg/dL Magnesium                                1.9               1.5-2.5          mg/dL Phosphorus                               3.4               2.3-4.6          mg/dL  CKMB, POC                                2.2               1.0-8.0          ng/mL  Troponin I, POC                          <0.05             0.00-0.09        ng/mL   Myoglobin, POC                           63.7              12-200           ng/mL  IMAGING: 2 View CXR:  IMPRESSION:   Mild hyperinflation, peribronchial thickening and increased   interstitial markings may reflect bronchitis or reactive airways   disease.  ASSESSMENT AND PLAN: 1) Atypical chest pain- this pain does not seem to be cardiac in origin based on its sharp character, its 2 week history, and its relation to arm movement.  However, pt does have some changes on her EKG compared to last year's which could represent ischemia.  We will admit her to telemetry, repeat EKG when she gets to the floor and in the a.m.  We will cycle cardiac enzymes x3.  We will risk stratify her with A1c, TSH, and FLP.   She may need cardiac clearance for surgery, but we will not consult cardiology at this point.  Leading diagnosis is MSK type pain from recent yard work vs. stress/anxiety related chest pain given her recent cancer diagnosis.  Will give tramadol for her pain.  Will give low dose benzo's for anxiety.  Will check 2D Echo.  2)  Colon cancer- Pt was supposed to see her surgeon, Dr. Excell Seltzer with [facility] tomorrow.  We will call [facility] to see her while she is in the hospital in case her surgery can be done while she is an inpatient, once she is ruled out for ACS.  3) Osteoarthritis-  we will try to control her pain with tramadol.  4)VTE PROPH: lovenox   Attending Physician: I performed and/or observed a history and physical examination of the patient.  I discussed the case with the residents as noted and reviewed the residents' notes.  I agree with the findings and plan--please refer to the attending physician note for more details. Signature Printed Name

## 2010-05-03 NOTE — Miscellaneous (Signed)
Summary: [facility] Discharge  Date of admission: 02/10/09  Date of discharge: 02/13/09  Brief reason for admission/active problems: Pt was admitted with atypical chest pain, was found to have EKG changes suggestive of recent MI, and a mild increase in troponins.  She was seen by Cardiology and underwent a coronary catheterization and found to have no CAD.  Her NSTEMI was attributed to recent stress surrounding new colon cancer diagnosis and takotsubo cardiodmyopathy.  Followup needed: Pt will need to see surgery regarding her colon cancer, as well as Cardiology for a repeat echo in preparation for surgery.  Please inquire about return of any chest pain.  See if she is tolerating her beta blocker without symptomatic bradycardia.  The cardiologist reccomended ACEi but her BP was low prior to D/C so not started at that time. Start a low dose ACE-I if her pressures can tolerate it.  The medication and problem lists have been updated.  Please see the dictated discharge summary for details.   Prescriptions: LISINOPRIL 2.5 MG TABS (LISINOPRIL) Take 1 tablet by mouth once a day  #30 x 1   Entered and Authorized by:   Yolonda Kida MD   Signed by:   Yolonda Kida MD on 02/13/2009   Method used:   Handwritten   RxID:   [id number] FLEXERIL 10 MG TABS (CYCLOBENZAPRINE HCL) three times a day as needed  #60 x 0   Entered and Authorized by:   Lester [place] MD   Signed by:   Lester [place] MD on 02/13/2009   Method used:   Handwritten   RxID:   [id number] TOPROL XL 25 MG XR24H-TAB (METOPROLOL SUCCINATE) Take 1 tablet by mouth once a day  #30 x 2   Entered and Authorized by:   Doreene Burke MD   Signed by:   Doreene Burke MD on 02/12/2009   Method used:   Handwritten   RxID:   [id number] ZOCOR 20 MG TABS (SIMVASTATIN) Take 1 tablet by mouth once a day  #30 x 2   Entered and Authorized by:   Doreene Burke MD   Signed by:   Doreene Burke MD on 02/12/2009   Method used:   Handwritten    RxID:   [id number]    Patient Instructions: 1)  Please go to your appointment with Dr. Excell Seltzer on Nov. 18th. 2)  Please go to your appointment with Dr. [name] on Nov. 23rd at 2:15 pm. 3)  Please come to our clinic for your appointment with Dr. Cathren Laine on Dec. 1 at 2:00pm. 4)  You will start taking a new medicine called Toprol XL 61m by mouth once daily.  If you notice any dizziness, new chest pain, passing out, please stop this medicine and give u[place] call in the clinic. 5)  You will also be taking simvastatin 221mby mouth once daily. 6)  You will be taking aspirin 32535my mouth once daily as well. 7)  Tobacco is very bad for your health and your loved ones! You Should stop smoking!. 8)  Stop Smoking Tips: Choose a Quit date. Cut down before the Quit date. decide what you will do as a substitute when you feel the urge to smoke(gum,toothpick,exercise).

## 2010-05-03 NOTE — Letter (Signed)
Summary: Colonoscopy Letter  [facility]  [place], [place] [address]   Phone: [phone]  Fax: [phone]      January 17, 2010 MRN: [id number]   [facility] [address] [place], [place]  [address]   Dear Erica [name],   According to your medical record, it is time for you to schedule a Colonoscopy. The American Cancer Society recommends this procedure as a method to detect early colon cancer. Patients with a family history of colon cancer, or a personal history of colon polyps or inflammatory bowel disease are at increased risk.  This letter has been generated based on the recommendations made at the time of your procedure. If you feel that in your particular situation this may no longer apply, please contact our office.  Please call our office at [phone] to schedule this appointment or to update your records at your earliest convenience.  Thank you for cooperating with [place] to provide you with the very best care possible.   Sincerely,   Gatha Mayer, M.D.  [facility] Gastroenterology Division [phone]

## 2010-05-03 NOTE — Letter (Signed)
Summary: [facility] Instructions  [facility]  [place], [place] [address]   Phone: [phone]  Fax: [phone]       LILLEE MOONEYHAN    December 04, 1950    MRN: [id number]        Procedure Day /Date:  Tuesday 01/25/2010     Arrival Time: 1:00 pm      Procedure Time: 2:00 pm     Location of Procedure:                    _x _  [facility] (4th Floor)                        [facility]   Starting 5 days prior to your procedure Thursday 10/20 do not eat nuts, seeds, popcorn, corn, beans, peas,  salads, or any raw vegetables.  Do not take any fiber supplements (e.g. Metamucil, Citrucel, and Benefiber).  THE DAY BEFORE YOUR PROCEDURE         DATE: Monday 10/24  1.  Drink clear liquids the entire day-NO SOLID FOOD  2.  Do not drink anything colored red or purple.  Avoid juices with pulp.  No orange juice.  3.  Drink at least 64 oz. (8 glasses) of fluid/clear liquids during the day to prevent dehydration and help the prep work efficiently.  CLEAR LIQUIDS INCLUDE: Water Jello Ice Popsicles Tea (sugar ok, no milk/cream) Powdered fruit flavored drinks Coffee (sugar ok, no milk/cream) Gatorade Juice: apple, white grape, white cranberry  Lemonade Clear bullion, consomm, broth Carbonated beverages (any kind) Strained chicken noodle soup Hard Candy                             4.  In the morning, mix first dose of MoviPrep solution:    Empty 1 Pouch A and 1 Pouch B into the disposable container    Add lukewarm drinking water to the top line of the container. Mix to dissolve    Refrigerate (mixed solution should be used within 24 hrs)  5.  Begin drinking the prep at 5:00 p.m. The MoviPrep container is divided by 4 marks.   Every 15 minutes drink the solution down to the next mark (approximately 8 oz) until the full liter is complete.   6.  Follow completed prep with 16 oz of clear liquid of your choice  (Nothing red or purple).  Continue to drink clear liquids until bedtime.  7.  Before going to bed, mix second dose of MoviPrep solution:    Empty 1 Pouch A and 1 Pouch B into the disposable container    Add lukewarm drinking water to the top line of the container. Mix to dissolve    Refrigerate  THE DAY OF YOUR PROCEDURE      DATE: Tuesday 10/25  Beginning at 9:00 a.m. (5 hours before procedure):         1. Every 15 minutes, drink the solution down to the next mark (approx 8 oz) until the full liter is complete.  2. Follow completed prep with 16 oz. of clear liquid of your choice.    3. You may drink clear liquids until 12:00 pm (2 HOURS BEFORE PROCEDURE).   MEDICATION INSTRUCTIONS  Unless otherwise instructed, you should take regular prescription medications with a small sip of water   as early as possible the morning of  your procedure.    Additional medication instructions: n/a         OTHER INSTRUCTIONS  You will need a responsible adult at least 61 years of age to accompany you and drive you home.   This person must remain in the waiting room during your procedure.  Wear loose fitting clothing that is easily removed.  Leave jewelry and other valuables at home.  However, you may wish to bring a book to read or  an iPod/MP3 player to listen to music as you wait for your procedure to start.  Remove all body piercing jewelry and leave at home.  Total time from sign-in until discharge is approximately 2-3 hours.  You should go home directly after your procedure and rest.  You can resume normal activities the  day after your procedure.  The day of your procedure you should not:   Drive   Make legal decisions   Operate machinery   Drink alcohol   Return to work  You will receive specific instructions about eating, activities and medications before you leave.    The above instructions have been reviewed and explained to me by   Sundra Aland RN  January 14, 2010 9:03 AM    I fully understand and can verbalize these instructions _____________________________ Date _________

## 2010-05-03 NOTE — Progress Notes (Signed)
Summary: call from Dr. Saralyn Pilar re: colon cancer dx.  Phone Note From Other Clinic   Summary of Call: Dr.Patrick called to say that biopsy of hepatic flexure showed adenocarcinoma.He will fax report when typed.Made aware that dr.Rowan Blaker is currently out of the office Initial call taken by: Abel Presto RN,  January 29, 2009 2:56 PM  Follow-up for Phone Call        I spoke to her and told her she has colon cancer. See pathology report for details Follow-up by: Gatha Mayer MD, [facility],  February 01, 2009 9:07 AM

## 2010-05-03 NOTE — Miscellaneous (Signed)
Summary: pv  Clinical Lists Changes  Medications: Added new medication of MIRALAX   POWD (POLYETHYLENE GLYCOL 3350) As per prep  instructions. - Signed Added new medication of METOCLOPRAMIDE HCL 10 MG  TABS (METOCLOPRAMIDE HCL) As per prep instructions. - Signed Added new medication of DULCOLAX 5 MG  TBEC (BISACODYL) Day before procedure take 2 at 3pm and 2 at 8pm. - Signed Rx of MIRALAX   POWD (POLYETHYLENE GLYCOL 3350) As per prep  instructions.;  #255gm x 0;  Signed;  Entered by: Laverna Peace RN;  Authorized by: Gatha Mayer MD;  Method used: Electronically to [facility] [phone]*, [address], [place], [place], [place]  [address], Ph: [phone], Fax: [phone] Rx of METOCLOPRAMIDE HCL 10 MG  TABS (METOCLOPRAMIDE HCL) As per prep instructions.;  #2 x 0;  Signed;  Entered by: Laverna Peace RN;  Authorized by: Gatha Mayer MD;  Method used: Electronically to [facility] [phone]*, [address], [place], [place], [place]  [address], Ph: [phone], Fax: [phone] Rx of DULCOLAX 5 MG  TBEC (BISACODYL) Day before procedure take 2 at 3pm and 2 at 8pm.;  #4 x 0;  Signed;  Entered by: Laverna Peace RN;  Authorized by: Gatha Mayer MD;  Method used: Electronically to [facility] [phone]*, [address], [place], [place], [place]  [address], Ph: [phone], Fax: [phone] Observations: Added new observation of ALLERGY REV: Done (04/09/2008 7:54) Added new observation of NKA: T (04/09/2008 7:54)    Prescriptions: DULCOLAX 5 MG  TBEC (BISACODYL) Day before procedure take 2 at 3pm and 2 at 8pm.  #4 x 0   Entered by:   Laverna Peace RN   Authorized by:   Gatha Mayer MD   Signed by:   Laverna Peace RN on 04/09/2008   Method used:   Electronically to        [facility] [phone]* (retail)       [place], [place]  [address]       Ph: [phone]       Fax: [phone]    RxID:   [id number] METOCLOPRAMIDE HCL 10 MG  TABS (METOCLOPRAMIDE HCL) As per prep instructions.  #2 x 0   Entered by:   Laverna Peace RN   Authorized by:   Gatha Mayer MD   Signed by:   Laverna Peace RN on 04/09/2008   Method used:   Electronically to        [facility] [phone]* (retail)       [address]       [place], [place]  [address]       Ph: [phone]       Fax: [phone]   RxID:   [id number] MIRALAX   POWD (POLYETHYLENE GLYCOL 3350) As per prep  instructions.  #255gm x 0   Entered by:   Laverna Peace RN   Authorized by:   Gatha Mayer MD   Signed by:   Laverna Peace RN on 04/09/2008   Method used:   Electronically to        [facility] [phone]* (retail)       [address]       [place], [place]  [address]  Ph: [phone]       Fax: [phone]   RxID:   [id number]

## 2010-05-03 NOTE — Progress Notes (Signed)
Summary: refill/ hla  Phone Note Refill Request Message from:  Fax from Pharmacy on March 23, 2009 4:13 PM  Refills Requested: Medication #1:  LISINOPRIL 2.5 MG TABS Take 1 tablet by mouth once a day.  Medication #2:  ZOCOR 20 MG TABS Take 1 tablet by mouth once a day Initial call taken by: Freddy Finner RN,  March 23, 2009 4:13 PM    Prescriptions: LISINOPRIL 2.5 MG TABS (LISINOPRIL) Take 1 tablet by mouth once a day  #30 x 11   Entered and Authorized by:   Burman Freestone MD   Signed by:   Burman Freestone MD on 03/23/2009   Method used:   Electronically to        [facility] [phone]* (retail)       [address]       [place], [place]  [address]       Ph: [phone]       Fax: [phone]   RxID:   [id number] ZOCOR 20 MG TABS (SIMVASTATIN) Take 1 tablet by mouth once a day  #30 x 11   Entered and Authorized by:   Burman Freestone MD   Signed by:   Burman Freestone MD on 03/23/2009   Method used:   Electronically to        [facility] [phone]* (retail)       [address]       [place], [place]  [address]       Ph: [phone]       Fax: [phone]   RxID:   [id number]

## 2010-05-03 NOTE — Assessment & Plan Note (Signed)
Summary: INJECTION IN KNEE PER PHONE TREE MSG/NWS   Vital Signs:  Patient profile:   60 year old female Height:      63 inches (160.02 cm) Weight:      167.12 pounds (75.96 kg) O2 Sat:      96 % on Room air Temp:     97.7 degrees F (36.50 degrees C) oral Pulse rate:   79 / minute BP sitting:   130 / 72  (left arm) Cuff size:   regular  Vitals Entered By: Tomma Lightning RMA (February 28, 2010 8:48 AM)  O2 Flow:  Room air  Procedure Note Last Tetanus: Historical (04/03/2004)  Injections: The patient complains of pain, irritation, inflammation, tenderness, and swelling but denies redness, foreign body, changing lesion, discharge, and fever. Duration of symptoms: 2 months Indication: chronic pain Consent signed: no  Procedure # 1: large joint injection    Region: right knee    Location: lateral infrapatellar approach    Technique: 20 g needle    Medication: 80 mg depomedrol    Anesthesia: 1.0 ml 1% lidocaine w/o epinephrine    Comment: right knee with adv DJD, pain and swelling >2 months, no injury. prior xray 02/16/10 reviewed - now for intraarticular injection due to failure of conservative medical care. pt elects to proceed after verbal consent obtained. pt informed of possible risks and complications. using sterile technique throughout, anesthesia achieved with 1% lidocaine. right knee injected from lateral infrapatellar approach with 1:3 depomedrol and lidocaine. pt tolerated procedure well without complication. postinjection instructions given.   Cleaned and prepped with: alcohol and betadine Wound dressing: bandaid Instructions: elevate and ice  CC: Injection in knee Is Patient Diabetic? No   Primary Care Provider:  Rowe Clack MD  CC:  Injection in knee.  History of Present Illness: here for f/u right knee xray reviewed 2 weeks ago - cont swelling and pain -  wants steroid shot prior to gen ortho eval  reviewed chronic med issues  1) colon cancer - dx  03/2009 and s/p r hemicoloectomy for same - multiple SBO prior to this time  2) crohns - recent dx 02/2010 due to ulcerations (also NSAID induced in DDx) - follows with GI for same - recently begun entercort and symptoms unchanged - cont diffuse abd pain and bloating - planning f/u GI next 48h to review options for tx  3) HTN - reports compliance with ongoing medical treatment and no changes in medication dose or frequency. denies adverse side effects related to current therapy. 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 One Tablet By Mouth Once Daily 2)  Lisinopril 2.5 Mg Tabs (Lisinopril) .... Take 1 Tablet By Mouth Once A Day 3)  Entocort Ec 3 Mg  Cp24 (Budesonide) .... Take 3 Capsules Daily Until 12/15 Then 1 Capsule Daily Until Dec 29 Then Stop. 4)  Meloxicam 7.5 Mg Tabs (Meloxicam) .[name].. 1 By Mouth Once Daily As Needed For  Pain  Allergies (verified): 1)  ! 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  The patient denies fever, muscle weakness, and suspicious skin lesions.    Physical  Exam  General:  alert, well-developed, well-nourished, and cooperative to examination.    Msk:  R knee: decreased range of motion, diffuse boggy synovitis. Tender to palpation on joint line. Increased pain with weight bearing. Positive crepitus.    Impression & Recommendations:  Problem # 1:  KNEE PAIN, RIGHT (ICD-719.46) prior xray 02/16/10 reviewed - adv tricompartmental DJD - declines ortho eval for TKR at this time (and prev) failing conserv med mgmt with meloxicam - here for injection - done (see procedure note) will reconsider ortho eval depending on length of response to tx -  Her updated medication list for this problem includes:    Meloxicam 7.5 Mg Tabs (Meloxicam) .[name]... 1 by mouth once daily as needed for pain  Orders: Joint Aspirate / Injection, Large (20610) Depo- Medrol 54m ([id number]  Problem # 2:  OSTEOARTHRITIS (ICD-715.90)  Her updated medication list for this problem includes:    Meloxicam 7.5 Mg Tabs (Meloxicam) ..[name].. 1 by mouth once daily as needed for pain  Orders: Joint Aspirate / Injection, Large (20610) Depo- Medrol 871m(J1040)  low back followed by ortho spine - pt declines "pain pills" and surg, considering back injections has been advised by ortho to seek opinion from rheum, esp with knee pain symptoms -  prev refer to rheum but not yet seen, no sig response to tx with low dose meloxicam  also steroid injection right knee today - see above Discussed use of medications, application of heat or cold, and exercises.   Complete Medication List: 1)  Toprol Xl 50 Mg Xr24h-tab (Metoprolol succinate) .... One tablet by mouth once daily 2)  Lisinopril 2.5 Mg Tabs (Lisinopril) .... Take 1 tablet by mouth once a day 3)  Entocort Ec 3 Mg Cp24 (Budesonide) .... Take 3 capsules daily until 12/15 then 1 capsule daily until dec 29 then stop. 4)  Meloxicam 7.5 Mg Tabs (Meloxicam) ...[name] 1 by mouth once daily as needed for pain  Patient Instructions: 1)  it was good to  see you today. 2)  right knee injected today - ice and elevate as needed, tylenol and/or melxicam as needed for pain 3)  talk with dr. brRolena Infantebout general ortho eval by knee doc in his group - or we can make referral as needed - 4)  Please schedule a follow-up appointment as needed or keep as previously scheduled to monitor cholestrol, weight and arthritis, call sooner if problems.    Orders Added: 1)  Est. Patient Level III [9[19147])  Joint Aspirate / Injection, Large [2[82956])  Depo- Medrol 8069mJ1040]

## 2010-05-03 NOTE — Assessment & Plan Note (Signed)
Summary: per Dr. Roseanne Kaufman hfu [mkj]   Vital Signs:  Patient profile:   60 year old female Height:      63 inches (160.02 cm) Weight:      164.9 pounds (74.95 kg) BMI:     29.32 O2 Sat:      97 % on Room air Temp:     97.4 degrees F oral Pulse rate:   74 / minute BP sitting:   133 / 78  (left arm)  Vitals Entered By: Morrison Old RN (March 03, 2009 1:56 PM)  O2 Flow:  Room air CC: New to clinic; Hospital f/u. Schedule for colon surgery on Friday. Is Patient Diabetic? No Pain Assessment Patient in pain? yes     Location: Back/shoulder Intensity: 5 Type: nagging ache Onset of pain  Chronic Nutritional Status BMI of 25 - 29 = overweight  Have you ever been in a relationship where you felt threatened, hurt or afraid?No   Does patient need assistance? Functional Status Self care Ambulation Normal   CC:  New to clinic; Hospital f/u. Schedule for colon surgery on Friday.[name]  History of Present Illness: The patient is a 60 year old lady with a past medical history significant for recent diagnosis of colon cancer, osteoarthritis, multiple small bowel obstruction, and status post laparotomy for adhesion lysis, recently admitted in the hospital for new onset chest pain and diagnosed with Takatsubo's syndrome comes in today for a follow up appointment.  She has her laprotomy for colon cacer on coming friday. She saw her primary cardiologist last week and was cleared from surgery point of view and was given a 3 month appointment for review. She comes in today mainly for lab test following her being recently started on ACEi's for recently diagnosed CHF.  Preventive Screening-Counseling & Management  Alcohol-Tobacco     Alcohol drinks/day: 0     Smoking Status: current     Smoking Cessation Counseling: yes     Packs/Day: 1.0     Year Started: smoked x 46 yrs.  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 Take 1 Tab Every 4-6 Hrs As Needed For Pain. 2)  Toprol Xl 25 Mg Xr24h-Tab (Metoprolol Succinate) .... Take 1 Tablet By Mouth Once A Day 3)  Aspirin 325 Mg Tabs (Aspirin) .... Take 1 Tablet By Mouth Once A Day 4)  Zocor 20 Mg Tabs (Simvastatin) .... Take 1 Tablet By Mouth Once A Day 5)  Flexeril 10 Mg Tabs (Cyclobenzaprine Hcl) .... Three Times A Day As Needed 6)  Lisinopril 2.5 Mg Tabs (Lisinopril) .... Take 1 Tablet By Mouth Once A Day  Current Medications (verified): 1)  Aleve 220 Mg Tabs (Naproxen Sodium) .... Take 1 Tab Every 4-6 Hrs As Needed For Pain. 2)  Toprol Xl 25 Mg Xr24h-Tab (Metoprolol Succinate) .... Take 1 Tablet By Mouth Once A Day 3)  Aspirin 325 Mg Tabs (Aspirin) .... Take 1 Tablet By Mouth Once A Day 4)  Zocor 20 Mg Tabs (Simvastatin) .... Take 1 Tablet By Mouth Once A Day 5)  Flexeril 10 Mg Tabs (Cyclobenzaprine Hcl) .... Three Times A Day As Needed 6)  Lisinopril 2.5 Mg Tabs (Lisinopril) .... Take 1 Tablet By Mouth Once A Day  Allergies (verified): No Known Drug Allergies  Past History:  Past Medical History: Colon cancer, hx of Congestive heart failure  Past Surgical History: Caesarean sectionx3 Hysterectomy Laprotomy for small bowel obstructions.  Social History: Smoking Status:  current Packs/Day:  1.0 Caffeine use/day:  9-10 cups of coffee/day  Review of Systems      See HPI  Physical Exam  Additional Exam:  Gen: AOx3, in no acute distress Eyes: PERRL, EOMI ENT:MMM, No erythema noted in posterior pharynx Neck: No JVD, No LAP Chest: CTAB with  good respiratory effort CVS: regular rhythmic rate, NO M/R/G, S1 S2 normal Abdo: soft,ND, BS+x4, Non tender and No hepatosplenomegaly EXT: No odema noted  Neuro: Non focal, gait is normal Skin: no rashes noted.    Impression & Recommendations:  Problem # 1:  TAKOTSUBO SYNDROME (ICD-429.83) Patients last EF was 40-45% and she was ahving some atrial wall aneurism. Her primary cardiologist is following her as an outpatient and had last visit 1 week prior to today. According to her Dr [name] asked her for a 3 month follow up and very convinced that she is making good progress. The catherization done during acute attack was not s/o any acute coronary abnormalities.  Problem # 2:  CONGESTIVE HEART FAILURE (ICD-428.0) Last echo done was in hospital s/o 40-45% EF. Started on lisinopril and BB and tolerating it well. Dr [name] to coordinate her care. Her updated medication list for this problem includes:    Toprol Xl 25 Mg Xr24h-tab (Metoprolol succinate) .[name]... Take 1 tablet by mouth once a day    Aspirin 325 Mg Tabs (Aspirin) .[name]... Take 1 tablet by mouth once a day    Lisinopril 2.5 Mg Tabs (Lisinopril) .[name]... 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 We will follow the surgery and GI recs.  Problem # 4:  Preventive Health Care (ICD-V70.0) She refused tetanus and flu shot at this time. She recently had mammogram done which was negative and she is not a candidate for colonoscopy at this time and we will follow her colonoscopy as indicated by GI.  Complete Medication List: 1)  Aleve 220 Mg Tabs (Naproxen sodium) .... Take 1 tab every 4-6 hrs as needed for pain. 2)  Toprol Xl 25 Mg Xr24h-tab (Metoprolol succinate) .... Take 1 tablet by mouth once a day 3)  Aspirin 325 Mg Tabs (Aspirin) .... Take 1 tablet by mouth once a day 4)  Zocor 20 Mg Tabs (Simvastatin) .... Take 1 tablet by mouth once a day 5)  Flexeril 10 Mg Tabs (Cyclobenzaprine hcl) .... Three times a day as needed  6)  Lisinopril 2.5 Mg Tabs (Lisinopril) .... Take 1 tablet by mouth once a day  Other Orders: T-Basic Metabolic Panel ([id number])  Patient Instructions: 1)  Please schedule a follow-up appointment in 6 months. 2)  Tobacco is very bad for your health and your loved ones! You Should stop smoking!. 3)  Stop Smoking Tips: Choose a Quit date. Cut down before the Quit date. decide what you will do as a substitute when you feel the urge to smoke(gum,toothpick,exercise). 4)  It is important that you exercise regularly at least 20 minutes 5 times a week. If you develop chest pain, have severe difficulty breathing, or feel very tired , stop exercising immediately and seek medical attention. 5)  You need to lose weight. Consider a lower calorie  diet and regular exercise.  6)  It is not healthy  for men to drink more than 2-3 drinks per day or for women to drink more than 1-2 drinks per day. 7)  BMP prior to visit, ICD-9:  Prevention & Chronic Care Immunizations   Influenza vaccine: Not documented   Influenza vaccine deferral: Refused  (03/03/2009)    Tetanus booster: Not documented   Td booster deferral: Deferred  (03/03/2009)    Pneumococcal vaccine: Not documented  Colorectal Screening   Hemoccult: Not documented   Hemoccult action/deferral: Not indicated  (03/03/2009)    Colonoscopy: DONE  (01/27/2009)   Colonoscopy action/deferral: Not indicated  (03/03/2009)   Colonoscopy due: 02/2010  Other Screening   Pap smear: Not documented   Pap smear action/deferral: Not indicated S/P hysterectomy  (03/03/2009)    Mammogram: Not documented   Mammogram action/deferral: Deferred  (03/03/2009)   Smoking status: current  (03/03/2009)   Smoking cessation counseling: yes  (03/03/2009)  Lipids   Total Cholesterol: Not documented   Lipid panel action/deferral: Deferred   LDL: Not documented   LDL Direct: Not documented   HDL: Not documented   Triglycerides: Not documented   Process Orders Check Orders Results:     [facility]: ABN not required for this insurance Tests Sent for requisitioning (March 03, 2009 11:38 PM):     03/03/2009: [facility] -- T-Basic Metabolic Panel [[id number]] (signed)

## 2010-05-03 NOTE — Letter (Signed)
Summary: MAMMOGRAM  MAMMOGRAM   Imported By: Garlan Fillers 07/01/2009 15:07:48  _____________________________________________________________________  External Attachment:    Type:   Image     Comment:   External Document

## 2010-05-03 NOTE — Progress Notes (Signed)
Summary: Recommendation of PCP  Phone Note Call from Patient Call back at [phone]   Caller: Patient Call For: Dr. Carlean Purl Reason for Call: Talk to Nurse Summary of Call: Last time she was in, Dr. Carlean Purl recommended a PCP and she would like to which one in Primary Care Initial call taken by: Webb Laws,  January 31, 2010 8:25 AM  Follow-up for Phone Call        message left for pt stating I could not find a recommendation in her chart and I will send Dr. Carlean Purl a note.  Advised I will call her with that information or she can call me today if she has further questions. Follow-up by: Abelino Derrick CMA Deborra Medina),  January 31, 2010 3:05 PM  Additional Follow-up for Phone Call Additional follow up Details #1::        we had talked about Dr. Asa Lente Additional Follow-up by: Gatha Mayer MD, Marval Regal,  February 01, 2010 6:10 AM    Additional Follow-up for Phone Call Additional follow up Details #2::    RC from pt.  Advised of PCP.  Advised pt I have no recommendations regarding colon path.  Pt states she is having a burning in her stomach and has had a change in bowels in the last two weeks.  She is having explosive, watery diarrhea.  Pt states she is also more fatigued than usual.  She denies any fever or blood in stool. Abelino Derrick CMA Deborra Medina)  February 01, 2010 8:30 AM   Additional Follow-up for Phone Call Additional follow up Details #3:: Details for Additional Follow-up Action Taken: It appears she has Crohn's disease vs. ulcers from NSAID's - I am not sure If she was taking alot of aleve it could have caused the ulcers - please ask her how much of that she was taking prior to the colonoscopy. we may need to start Entocort (prednisone)...will let her know after we get that info and either you or I will call her back also get an idea of how many bowel movements a day Additional Follow-up by: Gatha Mayer MD, Marval Regal,  February 01, 2010 9:20 AM  New/Updated  Medications: LOPERAMIDE HCL 2 MG TABS (LOPERAMIDE HCL) take 1-2 tabs by mouth every morning and up to 6 total per day pt states she is not taking any Aleve, Zocor or ASA.  She has not taken any of these meds for months prior to her colonoscopy. She is having 4+ bowel movements daily.  Usually after eating, but also during the day.  Pt also wants to know when her next colon will be.  Pt states she does not like to take medication, but will take Entocort "if needed to fix these ulcers in my colon."  Pharmacy is [facility] [facility]. Abelino Derrick CMA Deborra Medina)  February 01, 2010 1:41 PM    Have her use loperamide 2 mg take 1-2 every AM and up to 6 total a day for right now. I want to talk to pathologist. Let's get her in to see me when I get back if there are any other openings- I should still be calling her by end of week re: other tests ot Tx Gatha Mayer MD, [facility]  February 01, 2010 3:02 PM  Also, next routine colonoscopy in 2 years, polyp was benign - unless genetic testing tells [place] differently but i will let her know Gatha Mayer MD, [facility]  February 01, 2010 3:03 PM  Pt notified of above.  She is agreeable.  Appt scheduled for REV on 11/18 @ 9:15am.  Med list updated. Abelino Derrick CMA Deborra Medina)  February 01, 2010 4:12 PM

## 2010-05-03 NOTE — Progress Notes (Signed)
Summary: refill/ hla  Phone Note Refill Request Message from:  Fax from Pharmacy on April 21, 2009 11:45 AM  Refills Requested: Medication #1:  TOPROL XL 25 MG XR24H-TAB Take 1 tablet by mouth once a day   Last Refilled: 12/10 Initial call taken by: Freddy Finner RN,  April 21, 2009 11:45 AM  Follow-up for Phone Call       Follow-up by: Larey Dresser MD,  April 21, 2009 12:11 PM    Prescriptions: TOPROL XL 25 MG XR24H-TAB (METOPROLOL SUCCINATE) Take 1 tablet by mouth once a day  #30 x 2   Entered and Authorized by:   Larey Dresser MD   Signed by:   Larey Dresser MD on 04/21/2009   Method used:   Electronically to        [facility] [phone]* (retail)       [address]       [place], [place]  [address]       Ph: [phone]       Fax: [phone]   [place]:   [phone]

## 2010-05-03 NOTE — Assessment & Plan Note (Signed)
Summary: FOLLOW UP COLON/BIOPSY//SP   History of Present Illness Visit Type: Follow-up Visit Primary GI MD: Silvano Rusk MD [facility] Primary Provider: Rowe Clack MD Requesting Provider: na Chief Complaint: Crohn's History of Present Illness:   60 yo ww with prior hemi-colectomy for   Recent colonoscopy demonstrated ileal ulcers suspicious for Crohn's. Entocort EC was started about 2 weeks ago. She had been having diarrhea and stools are now forming up and are less in mass. She is somewhat bloated and it feels like bowel movenents are not completely effective in the last few days.   GI Review of Systems      Denies abdominal pain, acid reflux, belching, bloating, chest pain, dysphagia with liquids, dysphagia with solids, heartburn, loss of appetite, nausea, vomiting, vomiting blood, weight loss, and  weight gain.        Denies anal fissure, black tarry stools, change in bowel habit, constipation, diarrhea, diverticulosis, fecal incontinence, heme positive stool, hemorrhoids, irritable bowel syndrome, jaundice, light color stool, liver problems, rectal bleeding, and  rectal pain.    Colonoscopy  Procedure date:  01/25/2010  Findings:          1) 4 - 5 mm diminutive polyp at the splenic flexure - removed     2) Ulcers in the ileum - ? Crohn's - biopsied     3) Ulcers in the sigmoid colon - ? Crohn's vs. prep effect -     biopsied     4) Internal hemorrhoids     5) Otherwise normal examination     6) Personal history of T1N0 colon cancer resected 03/2009 + family     hx colon cancer sister and father  1. Ileum, biopsy,  :  -  CHRONIC ACTIVE ILEITIS WITH EROSION/ULCERATION. -  THERE IS NO EVIDENCE OF DYSPLASIA OR MALIGNANCY. -  SEE COMMENT.   2. Colon, polyp(s), splenic flexure :  - HYPERPLASTIC POLYP. - THERE IS NO EVIDENCE OF MALIGNANCY. 3. Colon, biopsy,  :  - CHRONIC MUCOSAL INJURY WITH APTHOID EROSIONS. - THERE IS NO EVIDENCE OF DYSPLASIA OR MALIGNANCY. - SEE  COMMENT.  Comments:      Repeat colonoscopy in 2 years.    Colonoscopy  Procedure date:  01/27/2009  Findings:          1) 15 - 18 mm sessile polyp at the hepatic flexure, will need     close follow-up to ensure complete removal if pathology benign.     Proximal and distal areas of site were marked with submucosal     SPOT.     2) 5 mm diminutive polyp in the rectum removed.     3) Internal hemorrhoids     4) Otherwise normal examination, excellent prep         1. COLON, HEPATIC FLEXURE:  ADENOCARCINOMA.   2. COLON, POLYP:  HYPERPLASTIC POLYP.  NO ADENOMATOUS CHANGE OR MALIGNANCY IDENTIFIED.  Procedures Next Due Date:    Colonoscopy: 02/2012   Current Medications (verified): 1)  Toprol Xl 50 Mg Xr24h-Tab (Metoprolol Succinate) .... One Tablet By Mouth Once Daily 2)  Lisinopril 2.5 Mg Tabs (Lisinopril) .... Take 1 Tablet By Mouth Once A Day 3)  Entocort Ec 3 Mg  Cp24 (Budesonide) .... Take 3 Capsules Daily 4)  Meloxicam 7.5 Mg Tabs (Meloxicam) .[name].. 1 By Mouth Once Daily As Needed For Pain  Allergies (verified): 1)  ! 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 (03/05/09) right hemicolectomy T1N0 colon cancer 03/2009 Cholecystectomy (1995) Tonsillectomy (1952) Appendectomy (1964) Hand surgery (1991)  Knee surgery, bilateral (1981)  Family History: Reviewed history from 02/16/2010 and no changes required. Family History of Arthritis (parent, other realtive) Family History of Colon CA 1st  degree relative <60 (parent, other relative) Family History Hypertension (parent) Kidney disease (parent) Throat cancer (parent)   Social History: Reviewed history from 02/16/2010 and no changes required. 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 Lungs:  Clear throughout to auscultation. Heart:  Regular rate and rhythm; no murmurs, rubs,  or bruits. Abdomen:  soft and non-tender Psych:  Alert and cooperative. Normal mood and affect.   Impression & Recommendations:  Problem # 1:  ? 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 pulse entocort o/w immunomodulator therapy would be  next i think - need to assess vaccines, PPD, etc at some point  handouts provided re: immunomodulator therapy  Problem # 2:  ADENOCARCINOMA, COLON, HX OF (ICD-V10.05) needs genetics counselling  Orders: Genetic Counselor NCareers adviser([facility])  Problem # 3:  FAMILY HISTORY OF COLON CA 1ST DEGREE RELATIVE <60 (ICD-V16.0)  needs genetic screening  Orders: Genetic Counselor NStefanie Libel([facility])  Patient Instructions: 1)  Continue Entocort EC at 9 milligrams a day until December 15 then take 1 a day until Dec 29 then stop. 2)  Schedule an appointment with Dr. GCarlean Purlfor late January. 3)  Read the Crohn's information provided. 4)  We will  contact the genetics counsellor to arrange an appointment about your personal and family history of colon cancer. 5)  The medication list was reviewed and reconciled.  All changed / newly prescribed medications were explained.  A complete medication list was provided to the patient / caregiver.   Immunizations Administered:  Influenza Vaccine # 1:    Vaccine Type: Fluvirin    Site: right deltoid    Mfr: Novartis    Dose: 0.5 ml    Route: IM    Given by: SAbelino DerrickCMA ([place]    Exp. Date: 09/02/2010    Lot #: [id number]   VIS given: 10/26/09 version given February 18, 2010.  Flu Vaccine Consent Questions:    Do you have a history of severe allergic reactions to this vaccine? no    Any  prior history of allergic reactions to egg and/or gelatin? no    Do you have a sensitivity to the preservative Thimersol? no    Do you have a past history of Guillan-Barre Syndrome? no    Do you currently have an acute febrile illness? no    Have you ever had a severe reaction to latex? no    Vaccine information given and explained to patient? yes    Are you currently pregnant? no

## 2010-05-03 NOTE — Progress Notes (Signed)
Summary: refill/ hla  Phone Note Refill Request Message from:  Fax from Pharmacy on April 19, 2009 5:35 PM  Refills Requested: Medication #1:  TOPROL XL 25 MG XR24H-TAB Take 1 tablet by mouth once a day   Last Refilled: 12/10 Initial call taken by: Freddy Finner RN,  April 19, 2009 5:36 PM

## 2010-05-03 NOTE — Miscellaneous (Signed)
Summary: previsit prep/rm  Clinical Lists Changes  Medications: Added new medication of MOVIPREP 100 GM  SOLR (PEG-KCL-NACL-NASULF-NA ASC-C) As per prep instructions. - Signed Rx of MOVIPREP 100 GM  SOLR (PEG-KCL-NACL-NASULF-NA ASC-C) As per prep instructions.;  #1 x 0;  Signed;  Entered by: Sundra Aland RN;  Authorized by: Gatha Mayer MD, [facility];  Method used: Electronically to [facility] [phone]*, [address], [place], [place], [place]  [address], Ph: [phone], Fax: [phone] Observations: Added new observation of ALLERGY REV: Done (01/14/2010 8:40)    Prescriptions: MOVIPREP 100 GM  SOLR (PEG-KCL-NACL-NASULF-NA ASC-C) As per prep instructions.  #1 x 0   Entered by:   Sundra Aland RN   Authorized by:   Gatha Mayer MD, [facility]   Signed by:   Sundra Aland RN on 01/14/2010   Method used:   Electronically to        [facility] [phone]* (retail)       [address]       [place], [place]  [address]       Ph: [phone]       Fax: [phone]   RxID:   [id number]

## 2010-05-03 NOTE — Letter (Signed)
Summary: *Referral Letter to Dr. [facility]  [place], [place] [address]   Phone: [phone]  Fax: [phone]    02/02/2009  Adonis Housekeeper, MD [facility], [place], [place]  Thank you in advance for agreeing to see my patient:  Erica [name] [address] [place], [place]  [address]  Phone: [phone]  Reason for Referral: Right-sided colon cancer  She is coming to see you on February 11, 2009. There was a malignant polyp partially removed from what I thought was hepatic flexure. The site was tattooed. I had asked Claudette Laws to run microsatellite studies on the polyp specimen but they need a surgical specimen to do so.  This will be important as she may have Lynch Syndrome. So, I am asking that you coordinate with pathology to run microsatellite testing on the resected specimen.  Thanks again.  Sincerely,  Eugenie Filler MD, [facility]  Appended Document: *Referral Letter to Dr. Excell Seltzer I spoke with [facility] she will make sure Dr Excell Seltzer sees the referral letter

## 2010-05-03 NOTE — Progress Notes (Signed)
Summary: Crohn's is working dx  Barista of Call: Let her know this is looking like Crohn's - I discussed with pathologist To start Entocort as prescribed and see me Nov 18 ok touse loperamide still but hopefully wil need less after Entocort kicks in Gatha Mayer MD, [facility]  February 03, 2010 9:18 AM   Follow-up for Phone Call        LM to [facility] at home number [place] Deborra Medina)  February 04, 2010 11:31 AM   I spoke to pt and notified her of the above.  She is agreeable with diagnosis and plan.  She will pick up medication at the pharmacy.  She will keep appt on 11/18. Follow-up by: Abelino Derrick CMA Deborra Medina),  February 08, 2010 10:03 AM  New Problems: ? 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       [place], [place]  [address]       Ph: [phone]       Fax: [phone]   RxID:   [id number]

## 2010-05-03 NOTE — Progress Notes (Signed)
Summary: CAT scan/labwork  Phone Note Call from Patient Call back at [phone]   Caller: Patient Call For: Dr. Carlean Purl Reason for Call: Lab or Test Results Summary of Call: want to know when she should get her results for her CAT scan and labwork Initial call taken by: Lucien Mons,  February 03, 2009 12:07 PM  Follow-up for Phone Call        Patient  notified of CT and lab results Follow-up by: Barb Merino RN, CGRN,  February 03, 2009 1:51 PM

## 2010-05-03 NOTE — Progress Notes (Signed)
Summary: Can she take Tylenol  Phone Note Call from Patient Call back at [phone]   Call For: Dr Carlean Purl Summary of Call: Can she take Tylenol for her arthritis pain? Initial call taken by: Irwin Brakeman [facility],  February 02, 2009 8:07 AM  Follow-up for Phone Call        Patient  notified ok to use Tylenol arthritis Follow-up by: Barb Merino RN, CGRN,  February 02, 2009 8:26 AM

## 2010-05-05 ENCOUNTER — Encounter: Payer: Self-pay | Admitting: Gastroenterology

## 2010-05-05 ENCOUNTER — Encounter: Payer: Self-pay | Admitting: Nurse Practitioner

## 2010-05-05 ENCOUNTER — Telehealth (INDEPENDENT_AMBULATORY_CARE_PROVIDER_SITE_OTHER): Payer: Self-pay | Admitting: *Deleted

## 2010-05-05 ENCOUNTER — Other Ambulatory Visit: Payer: Managed Care, Other (non HMO)

## 2010-05-05 ENCOUNTER — Ambulatory Visit (INDEPENDENT_AMBULATORY_CARE_PROVIDER_SITE_OTHER)
Admission: RE | Admit: 2010-05-05 | Discharge: 2010-05-05 | Disposition: A | Discharge status: Home / Self Care | Payer: Managed Care, Other (non HMO) | Source: Ambulatory Visit | Attending: Gastroenterology | Admitting: Gastroenterology

## 2010-05-05 ENCOUNTER — Encounter (INDEPENDENT_AMBULATORY_CARE_PROVIDER_SITE_OTHER): Payer: Self-pay | Admitting: *Deleted

## 2010-05-05 ENCOUNTER — Other Ambulatory Visit: Payer: Self-pay | Admitting: Gastroenterology

## 2010-05-05 ENCOUNTER — Ambulatory Visit (INDEPENDENT_AMBULATORY_CARE_PROVIDER_SITE_OTHER): Payer: Managed Care, Other (non HMO) | Admitting: Nurse Practitioner

## 2010-05-05 ENCOUNTER — Other Ambulatory Visit: Payer: Self-pay | Admitting: Nurse Practitioner

## 2010-05-05 ENCOUNTER — Telehealth: Payer: Self-pay | Admitting: Internal Medicine

## 2010-05-05 DIAGNOSIS — C189 Malignant neoplasm of colon, unspecified: Secondary | ICD-10-CM

## 2010-05-05 DIAGNOSIS — K56609 Unspecified intestinal obstruction, unspecified as to partial versus complete obstruction: Secondary | ICD-10-CM

## 2010-05-05 DIAGNOSIS — R1084 Generalized abdominal pain: Secondary | ICD-10-CM

## 2010-05-05 DIAGNOSIS — R112 Nausea with vomiting, unspecified: Secondary | ICD-10-CM

## 2010-05-05 DIAGNOSIS — Z8719 Personal history of other diseases of the digestive system: Secondary | ICD-10-CM | POA: Insufficient documentation

## 2010-05-05 DIAGNOSIS — K5900 Constipation, unspecified: Secondary | ICD-10-CM

## 2010-05-05 DIAGNOSIS — R109 Unspecified abdominal pain: Secondary | ICD-10-CM

## 2010-05-05 LAB — AMYLASE: Amylase: 63 U/L (ref 27–131)

## 2010-05-05 LAB — COMPREHENSIVE METABOLIC PANEL
ALT: 11 U/L (ref 0–35)
AST: 16 U/L (ref 0–37)
Albumin: 4.1 g/dL (ref 3.5–5.2)
Alkaline Phosphatase: 97 U/L (ref 39–117)
BUN: 12 mg/dL (ref 6–23)
CO2: 29 mEq/L (ref 19–32)
Calcium: 10 mg/dL (ref 8.4–10.5)
Chloride: 102 mEq/L (ref 96–112)
Creatinine, Ser: 0.7 mg/dL (ref 0.4–1.2)
GFR: 92.2 mL/min (ref >60.00)
Glucose, Bld: 98 mg/dL (ref 70–99)
Potassium: 4.4 mEq/L (ref 3.5–5.1)
Sodium: 138 mEq/L (ref 135–145)
Total Bilirubin: 0.2 mg/dL — ABNORMAL LOW (ref 0.3–1.2)
Total Protein: 7.2 g/dL (ref 6.0–8.3)

## 2010-05-05 LAB — URINALYSIS, ROUTINE W REFLEX MICROSCOPIC
Bilirubin Urine: NEGATIVE
Ketones, ur: NEGATIVE
Leukocytes, UA: NEGATIVE
Nitrite: NEGATIVE
Specific Gravity, Urine: >=1.03 (ref 1.000–1.030)
Total Protein, Urine: NEGATIVE
Urine Glucose: NEGATIVE
Urobilinogen, UA: 0.2 (ref 0.0–1.0)
pH: 5 (ref 5.0–8.0)

## 2010-05-05 LAB — LIPASE: Lipase: 23 U/L (ref 11.0–59.0)

## 2010-05-05 LAB — CBC WITH DIFFERENTIAL/PLATELET
Basophils Absolute: 0.1 10*3/uL (ref 0.0–0.1)
Basophils Relative: 0.7 % (ref 0.0–3.0)
Eosinophils Absolute: 0.1 10*3/uL (ref 0.0–0.7)
Eosinophils Relative: 1 % (ref 0.0–5.0)
HCT: 38.4 % (ref 36.0–46.0)
Hemoglobin: 12.9 g/dL (ref 12.0–15.0)
Lymphocytes Relative: 34.3 % (ref 12.0–46.0)
Lymphs Abs: 2.8 10*3/uL (ref 0.7–4.0)
MCHC: 33.6 g/dL (ref 30.0–36.0)
MCV: 91.1 fl (ref 78.0–100.0)
Monocytes Absolute: 0.7 10*3/uL (ref 0.1–1.0)
Monocytes Relative: 7.9 % (ref 3.0–12.0)
Neutro Abs: 4.6 10*3/uL (ref 1.4–7.7)
Neutrophils Relative %: 56.1 % (ref 43.0–77.0)
Platelets: 347 10*3/uL (ref 150.0–400.0)
RBC: 4.21 Mil/uL (ref 3.87–5.11)
RDW: 13.9 % (ref 11.5–14.6)
WBC: 8.3 10*3/uL (ref 4.5–10.5)

## 2010-05-05 LAB — HIGH SENSITIVITY CRP: CRP, High Sensitivity: 1.73 mg/L (ref 0.00–5.00)

## 2010-05-05 NOTE — Letter (Signed)
Summary: Surgical Clearance/[facility]  Surgical Clearance/[facility]   Imported By: Bubba Hales 03/30/2010 09:04:11  _____________________________________________________________________  External Attachment:    Type:   Image     Comment:   External Document

## 2010-05-05 NOTE — Assessment & Plan Note (Signed)
Summary: SURGICAL CLEARANCE FOR [facility]/NWS #   Vital Signs:  Patient profile:   60 year old female Height:      63 inches (160.02 cm) Weight:      167.8 pounds (76.27 kg) O2 Sat:      95 % on Room air Temp:     98.4 degrees F (36.89 degrees C) oral Pulse rate:   78 / minute BP sitting:   112 / 70  (left arm) Cuff size:   regular  Vitals Entered By: Tomma Lightning RMA (March 23, 2010 9:29 AM)  O2 Flow:  Room air CC: Surgical clearance Is Patient Diabetic? No Pain Assessment Patient in pain? no        Primary Care Provider:  Rowe Clack MD  CC:  Surgical clearance.  History of Present Illness: here for preop clearance - requested by dr. Alvan Dame  upcoming TKR 03/29/10 for right knee knee xray reviewed 02/2010 - cont swelling and pain -  02/28/10 given steroid shot - min improved - saw gen ortho for same - now TKR planned  no CP, no SOB, no syncope or dizziness no DM, renal or CHF hx no exertional symptoms of SOB or CP (limited activity only by knee/back pain)  also reviewed chronic med issues: 1) colon cancer - dx 03/2009 and s/p r hemicolectomy for same - multiple SBO prior to this time  2) crohns - recent dx 02/2010 due to ulcerations (also NSAID induced in DDx) - follows with GI for same - recently begun entercort and symptoms unchanged - cont diffuse abd pain and bloating -  3) HTN - reports compliance with ongoing medical treatment and no changes in medication dose or frequency. denies adverse side effects related to current therapy. BP tx related to stress induced MI 12/2008 - no CP, HA or edema -  4) c/o arthritis - chronic in low back and declines surg or pain meds -- follows with ortho for same ([facility]) - now inc pain and swelling in right knee - hx B "knee surg" in 1981 but denies TKR - pain wosrt with any activity - leg/knee occ "locks" and twists - would like to try med to reduce pain and also see rheum for same (as per spine ortho advice)  Preventive  Screening-Counseling & Management  Alcohol-Tobacco     Alcohol drinks/day: 0     Alcohol Counseling: not indicated; patient does not drink     Smoking Status: current     Smoking Cessation Counseling: yes     Packs/Day: 1.0     Year Started: smoked x 46 yrs.  Caffeine-Diet-Exercise     Caffeine use/day: 9-10 cups of coffee/day  Clinical Review Panels:  Prevention   Last Mammogram:  Done @ [facility] No specific mammographic evidence of malignancy.  Assessment: BIRADS 1. (02/04/2010)   Last Colonoscopy:  DONE (01/25/2010)  Immunizations   Last Tetanus Booster:  Historical (04/03/2004)   Last Flu Vaccine:  Fluvirin (02/18/2010)   Last Pneumovax:  Historical (04/03/2008)  Lipid Management   Cholesterol:  225 (02/16/2010)   HDL (good cholesterol):  53.40 (02/16/2010)  Diabetes Management   Creatinine:  1.1 (02/16/2010)   Last Flu Vaccine:  Fluvirin (02/18/2010)   Last Pneumovax:  Historical (04/03/2008)  CBC   WBC:  9.5 (02/16/2010)   RBC:  3.99 (02/16/2010)   Hgb:  12.5 (02/16/2010)   Hct:  36.5 (02/16/2010)   Platelets:  262.0 (02/16/2010)   MCV  91.7 (02/16/2010)   MCHC  34.1 (02/16/2010)   RDW  13.2 (02/16/2010)   PMN:  61.6 (02/16/2010)   Lymphs:  28.0 (02/16/2010)   Monos:  7.2 (02/16/2010)   Eosinophils:  2.5 (02/16/2010)   Basophil:  0.7 (02/16/2010)  Complete Metabolic Panel   Glucose:  102 (02/16/2010)   Sodium:  144 (02/16/2010)   Potassium:  5.3 (02/16/2010)   Chloride:  108 (02/16/2010)   CO2:  28 (02/16/2010)   BUN:  23 (02/16/2010)   Creatinine:  1.1 (02/16/2010)   Albumin:  3.5 (03/19/2009)   Total Protein:  6.6 (03/19/2009)   Calcium:  10.0 (02/16/2010)   Total Bili:  0.1 (03/19/2009)   Alk Phos:  103 (03/19/2009)   SGPT (ALT):  86 (03/19/2009)   SGOT (AST):  21 (03/19/2009)   Current Medications (verified): 1)  Toprol Xl 50 Mg Xr24h-Tab (Metoprolol Succinate) .... One Tablet By Mouth Once Daily 2)  Lisinopril 2.5 Mg Tabs  (Lisinopril) .... Take 1 Tablet By Mouth Once A Day 3)  Entocort Ec 3 Mg  Cp24 (Budesonide) .... Take 3 Capsules Daily Until 12/15 Then 1 Capsule Daily Until Dec 29 Then Stop.  Allergies (verified): 1)  ! Morphine  Past History:  Past medical, surgical, family and social histories (including risk factors) reviewed, and no changes noted (except as noted below).  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 Caesarean sectionx3 Hysterectomy (1994) Laprotomy for small bowel obstructions. (03/05/09) right hemicolectomy T1N0 colon cancer 03/2009 Cholecystectomy (1995) Tonsillectomy (1952) Appendectomy (1964) Hand surgery (1991)  Knee surgery, bilateral (1981)  Family History: Reviewed history from 02/18/2010 and no changes required. Family History of Arthritis (parent, other realtive) Family History of Colon CA 1st degree relative <60 (parent, other relative) Family History Hypertension (parent) Kidney disease (parent) Throat cancer (parent)   Social History: Reviewed history from 02/16/2010 and no changes required. 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 I have reviewed all other systems and they were negative.   Physical Exam  General:  alert, well-developed, well-nourished, and cooperative to examination.    Eyes:  vision grossly intact; pupils equal, round and reactive to light.  conjunctiva and lids  normal.    Lungs:  normal respiratory effort, no intercostal retractions or use of accessory muscles; normal breath sounds bilaterally - no crackles and no wheezes.    Heart:  normal rate, regular rhythm, no murmur, and no rub. BLE without edema. normal DP pulses and normal cap refill in all 4 extremities    Abdomen:  soft, non-tender, normal bowel sounds, no distention; no masses and no appreciable hepatomegaly or sple.abd nomegaly.   Msk:  R knee: decreased range of motion, diffuse boggy synovitis. Tender to palpation on joint line. Increased pain with weight bearing. Positive crepitus.  Neurologic:  alert & oriented X3 and cranial nerves II-XII symetrically intact.  strength normal in all extremities, sensation intact to light touch, and gait normal. speech fluent without dysarthria or aphasia; follows commands  with good comprehension.  Skin:  no rashes, vesicles, ulcers, or erythema. No nodules or irregularity to palpation.  Psych:  Oriented X3, memory intact for recent and remote, normally interactive, good eye contact, not anxious appearing, not depressed appearing, and not agitated.      Impression & Recommendations:  Problem # 1:  PREOPERATIVE EXAMINATION (ICD-V72.84) This patient has been evaluated and it is felt that the surgical risk is low and outweighed by the potential benefit of the surgery. Therefore, medically clear to proceed when scheduling allows. Copy of this note, ekg and clearance request fax sent back to dr. Alvan Dame  Problem # 2:  OSTEOARTHRITIS (ICD-715.90)  The following medications were removed from the medication list:    Meloxicam 7.5 Mg Tabs (Meloxicam) .[name]... 1 by mouth once daily as needed for pain   low back followed by ortho spine -  pt declines "pain pills" and surg, s/p back injection by [facility] 03/2010 now for R TKR Discussed use of medications, application of heat or cold, and exercises.   Problem # 3:  HYPERTENSION (ICD-401.9)  Her updated medication list  for this problem includes:    Toprol Xl 50 Mg Xr24h-tab (Metoprolol succinate) ..... One tablet by mouth once daily    Lisinopril 2.5 Mg Tabs (Lisinopril) .[name]... Take 1 tablet by mouth once a day  Orders: EKG w/ Interpretation (93000)  BP today: 112/70 Prior BP: 130/72 (02/28/2010)  Labs Reviewed: K+: 5.3 (02/16/2010) Creat: : 1.1 (02/16/2010)   Chol: 225 (02/16/2010)   HDL: 53.40 (02/16/2010)   TG: 171.0 (02/16/2010)  Problem # 4:  BACK PAIN, LUMBAR (ICD-724.2)  The following medications were removed from the medication list:    Meloxicam 7.5 Mg Tabs (Meloxicam) .[name]... 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  The catherization done during acute attack was not s/o any acute coronary abnormalities.  Complete Medication List: 1)  Toprol Xl 50 Mg Xr24h-tab (Metoprolol succinate) .... One tablet by mouth once daily 2)  Lisinopril 2.5 Mg Tabs (Lisinopril) .... Take 1 tablet by mouth once a day 3)  Entocort Ec 3 Mg Cp24 (Budesonide) .... Take 3 capsules daily until 12/15 then 1 capsule daily until dec 29 then stop.  Patient Instructions: 1)  it was good to see you today. 2)  You have been evaluated and it is felt that your surgical risk is low and outweighed by the potential benefit of the surgery. Therefore, you are medically clear to proceed when scheduling allows.  3)  [facility] will be notified of same - good luck! 4)  keep followup as previously planned, call sooner if problems   Orders Added: 1)  EKG w/ Interpretation [93000] 2)  Consultation Level IV  [09983]

## 2010-05-05 NOTE — Consult Note (Signed)
Summary: Erica [name]   Imported By: Phillis Knack 03/30/2010 08:21:17  _____________________________________________________________________  External Attachment:    Type:   Image     Comment:   External Document

## 2010-05-05 NOTE — Assessment & Plan Note (Addendum)
Summary: REV   History of Present Illness Visit Type: Follow-up Visit Primary GI MD: Silvano Rusk MD [facility] Primary Provider: Rowe Clack MD Requesting Provider: n/a Chief Complaint: ? Crohn's ileitis History of Present Illness:   60 yo ww with history of colon cancer and ileal ulcers discovered on follow-up colonoscopy (surveillance-screening). She was treated with Entocort for 6-8 weeks. Not having any abdominal pain, bowel disturbance except mild constipation on narcotics intermittently after knee replacement.  She is having more problems with chronic back pain.   GI Review of Systems      Denies abdominal pain, acid reflux, belching, bloating, chest pain, dysphagia with liquids, dysphagia with solids, heartburn, loss of appetite, nausea, vomiting, vomiting blood, weight loss, and  weight gain.        Denies anal fissure, black tarry stools, change in bowel habit, constipation, diarrhea, diverticulosis, fecal incontinence, heme positive stool, hemorrhoids, irritable bowel syndrome, jaundice, light color stool, liver problems, rectal bleeding, and  rectal pain.    Current Medications (verified): 1)  Toprol Xl 50 Mg Xr24h-Tab (Metoprolol Succinate) .... One Tablet By Mouth Once Daily 2)  Lisinopril 2.5 Mg Tabs (Lisinopril) .... Take 1 Tablet By Mouth Once A Day 3)  Bayer Aspirin 325 Mg Tabs (Aspirin) .[name].. 1 By Mouth Once Daily 4)  Hydrocodone-Acetaminophen 5-325 Mg Tabs (Hydrocodone-Acetaminophen) .... As Needed For Knee Pain  Allergies (verified): 1)  ! 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 Crohn's disease 2011  MD roster: GI - [facility] - [facility] - [facility] card - h [place]  Past Surgical History: Caesarean sectionx3 Hysterectomy (1994) Laprotomy for small bowel obstructions. (03/05/09) right hemicolectomy T1N0 colon cancer 03/2009 Cholecystectomy (1995) Tonsillectomy (1952) Appendectomy (1964) Hand surgery (1991)  Knee surgery, bilateral (1981) Knee replacement right knee 12/11  Vital Signs:  Patient profile:   60 year old female Height:      63 inches Weight:      160 pounds BMI:     28.45 BSA:     1.76 Pulse rate:   82 / minute Pulse rhythm:   regular BP sitting:   118 / 76  (left arm)  Vitals Entered By: Elmont Deborra Medina) (April 29, 2010 3:11 PM)  Physical Exam  General:  alert, well-developed, well-nourished, and cooperative to examination.    Abdomen:  soft and nontender   Impression & Recommendations:  Problem # 1:  ? of CROHN'S DISEASE, LARGE AND SMALL INTESTINES (ICD-555.2) Assessment Unchanged remains asymptomatic she has had small bowel obstructions in the past and these were attributed to adhesions or herniae. she seems asymptomatic now and we will continue with observation after 1 course of Entocort. given small bowel disease, immunomodulators would be next Rx if needed (for chronic Tx) pulse Entocort an option repeat endoscopic evaluation at some point likely will use  clinical course to guide  see me in 6 months, sooner as needed  Problem # 2:  ADENOCARCINOMA, COLON, HX OF (ICD-V10.05) Assessment: Unchanged She has not yet seen  genetics counsellor we will try to get her an appontment  Problem # 3:  FAMILY HISTORY OF COLON CA 1ST DEGREE RELATIVE <60 (ICD-V16.0) Assessment: Unchanged  Patient Instructions: 1)  Please continue current medications.  2)  Please schedule a follow-up appointment in 6 months. 3)  See [place] sooner if you are havng problems. 4)  The medication list was reviewed and reconciled.  All changed / newly prescribed medications were explained.  A complete medication list was provided to the patient / caregiver.

## 2010-05-05 NOTE — Op Note (Signed)
Summary: Epidural injection/[facility]  Epidural injection/[facility]   Imported By: Bubba Hales 03/30/2010 11:18:37  _____________________________________________________________________  External Attachment:    Type:   Image     Comment:   External Document

## 2010-05-06 ENCOUNTER — Inpatient Hospital Stay (HOSPITAL_COMMUNITY)
Admission: AD | Admit: 2010-05-06 | Discharge: 2010-05-09 | DRG: 392 | Disposition: A | Discharge status: Home / Self Care | Payer: Managed Care, Other (non HMO) | Source: Ambulatory Visit | Attending: Gastroenterology | Admitting: Gastroenterology

## 2010-05-06 ENCOUNTER — Inpatient Hospital Stay (HOSPITAL_COMMUNITY): Payer: Managed Care, Other (non HMO)

## 2010-05-06 DIAGNOSIS — T40605A Adverse effect of unspecified narcotics, initial encounter: Secondary | ICD-10-CM | POA: Diagnosis present

## 2010-05-06 DIAGNOSIS — R1012 Left upper quadrant pain: Secondary | ICD-10-CM

## 2010-05-06 DIAGNOSIS — I10 Essential (primary) hypertension: Secondary | ICD-10-CM | POA: Diagnosis present

## 2010-05-06 DIAGNOSIS — R112 Nausea with vomiting, unspecified: Secondary | ICD-10-CM | POA: Diagnosis present

## 2010-05-06 DIAGNOSIS — E785 Hyperlipidemia, unspecified: Secondary | ICD-10-CM | POA: Diagnosis present

## 2010-05-06 DIAGNOSIS — R110 Nausea: Secondary | ICD-10-CM

## 2010-05-06 DIAGNOSIS — Z85038 Personal history of other malignant neoplasm of large intestine: Secondary | ICD-10-CM

## 2010-05-06 DIAGNOSIS — I509 Heart failure, unspecified: Secondary | ICD-10-CM | POA: Diagnosis present

## 2010-05-06 DIAGNOSIS — Z9049 Acquired absence of other specified parts of digestive tract: Secondary | ICD-10-CM

## 2010-05-06 DIAGNOSIS — Z96659 Presence of unspecified artificial knee joint: Secondary | ICD-10-CM

## 2010-05-06 DIAGNOSIS — K5900 Constipation, unspecified: Secondary | ICD-10-CM | POA: Diagnosis present

## 2010-05-06 LAB — AMYLASE: Amylase: 69 U/L (ref 0–105)

## 2010-05-06 LAB — LIPASE, BLOOD: Lipase: 25 U/L (ref 11–59)

## 2010-05-07 ENCOUNTER — Encounter: Payer: Self-pay | Admitting: Internal Medicine

## 2010-05-07 ENCOUNTER — Inpatient Hospital Stay (HOSPITAL_COMMUNITY): Payer: Managed Care, Other (non HMO)

## 2010-05-07 LAB — DIFFERENTIAL
Basophils Absolute: 0 10*3/uL (ref 0.0–0.1)
Basophils Relative: 1 % (ref 0–1)
Eosinophils Absolute: 0.2 10*3/uL (ref 0.0–0.7)
Eosinophils Relative: 2 % (ref 0–5)
Lymphocytes Relative: 44 % (ref 12–46)
Lymphs Abs: 3.5 10*3/uL (ref 0.7–4.0)
Monocytes Absolute: 0.8 10*3/uL (ref 0.1–1.0)
Monocytes Relative: 10 % (ref 3–12)
Neutro Abs: 3.5 10*3/uL (ref 1.7–7.7)
Neutrophils Relative %: 44 % (ref 43–77)

## 2010-05-07 LAB — BASIC METABOLIC PANEL
BUN: 10 mg/dL (ref 6–23)
CO2: 28 mEq/L (ref 19–32)
Calcium: 9.1 mg/dL (ref 8.4–10.5)
Chloride: 104 mEq/L (ref 96–112)
Creatinine, Ser: 0.82 mg/dL (ref 0.4–1.2)
GFR calc Af Amer: >60 mL/min (ref >60)
GFR calc non Af Amer: >60 mL/min (ref >60)
Glucose, Bld: 108 mg/dL — ABNORMAL HIGH (ref 70–99)
Potassium: 3.9 mEq/L (ref 3.5–5.1)
Sodium: 139 mEq/L (ref 135–145)

## 2010-05-07 LAB — CBC
HCT: 36.1 % (ref 36.0–46.0)
Hemoglobin: 11.4 g/dL — ABNORMAL LOW (ref 12.0–15.0)
MCH: 29 pg (ref 26.0–34.0)
MCHC: 31.6 g/dL (ref 30.0–36.0)
MCV: 91.9 fL (ref 78.0–100.0)
Platelets: 298 10*3/uL (ref 150–400)
RBC: 3.93 MIL/uL (ref 3.87–5.11)
RDW: 13.1 % (ref 11.5–15.5)
WBC: 8.1 10*3/uL (ref 4.0–10.5)

## 2010-05-07 MED ORDER — IOHEXOL 300 MG/ML  SOLN
100.0000 mL | Freq: Once | INTRAMUSCULAR | Status: AC | PRN
  Administered 2010-05-07: 100 mL via INTRAVENOUS

## 2010-05-08 LAB — URINE CULTURE
Colony Count: NO GROWTH
Culture  Setup Time: 201202040137
Culture: NO GROWTH
Special Requests: NEGATIVE

## 2010-05-10 ENCOUNTER — Encounter: Payer: Self-pay | Admitting: Internal Medicine

## 2010-05-10 ENCOUNTER — Telehealth: Payer: Self-pay | Admitting: Internal Medicine

## 2010-05-10 ENCOUNTER — Encounter (INDEPENDENT_AMBULATORY_CARE_PROVIDER_SITE_OTHER): Payer: Self-pay | Admitting: *Deleted

## 2010-05-10 ENCOUNTER — Other Ambulatory Visit: Payer: Managed Care, Other (non HMO)

## 2010-05-10 DIAGNOSIS — R197 Diarrhea, unspecified: Secondary | ICD-10-CM

## 2010-05-11 NOTE — Assessment & Plan Note (Addendum)
Summary: nausea/sheri   Vital Signs:  Patient profile:   60 year old female Height:      63 inches Weight:      155 pounds BMI:     27.56 BSA:     1.74 Pulse rate:   88 / minute Pulse rhythm:   regular BP sitting:   128 / 74  (left arm) Cuff size:   regular  Vitals Entered By: Hope Pigeon CMA (May 05, 2010 10:54 AM)  Current Medications (verified): 1)  Toprol Xl 50 Mg Xr24h-Tab (Metoprolol Succinate) .... One Tablet By Mouth Once Daily 2)  Lisinopril 2.5 Mg Tabs (Lisinopril) .... Take 1 Tablet By Mouth Once A Day 3)  Hydrocodone-Acetaminophen 5-325 Mg Tabs (Hydrocodone-Acetaminophen) .... As Needed For Knee Pain 4)  Miralax  Powd (Polyethylene Glycol 3350) .... As Needed 5)  Promethazine Hcl 25 Mg Tabs (Promethazine Hcl) .... One Half To A Whole  Tablet By Mouth  Every 6 Hours As Needed For Nausea  Allergies (verified): 1)  ! Morphine History of Present Illness:   Patient is a 60 year old female known to Dr. Carlean Purl for family history of CRC, personal history of CRC and IBD. 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 Underwent right hemicolectomy Dec. 2010, discharged home but readmitted a few days later with abdominal pain and CTscan revealing ileitis. She improved with antibiotics. Patient had surveillance colonoscopy Oct. 2011 and was found to have ileal and sigmoid ulcers. Biopsies suggested Crohn's disease. Patient was given a 6-8 week course of Entocort to be followed by observation. She came for follow up a few days ago and was actually do well except for mild constipation.   Three days ago patient developed acute upper abdominal pain, nausea, vomiting (non-bloody) and diarrhea. The pain is in LUQ. She also has a separate pain in left upper back. Her pain is intermittent, doesn't feel like previous SBO. Since Monday she really hasn't had a BM. Took Miralax yesterday. No fevers. She also complains of horrible acid reflux since  Monday. Her weight is down 5 pounds over the last week.   GI Review of Systems    Reports acid reflux, heartburn, loss of appetite, nausea, and  weight loss.     Location of  Abdominal pain: upper abdomen.    Denies abdominal pain, belching, bloating, chest pain, dysphagia with liquids, dysphagia with solids, vomiting, vomiting blood, and  weight gain.        Denies anal fissure, black tarry stools, change in bowel habit, constipation, diarrhea, diverticulosis, fecal incontinence, heme positive stool, hemorrhoids, irritable bowel syndrome, jaundice, light color stool, liver problems, rectal bleeding, and  rectal pain.  Past History:  Past Medical History: Reviewed history from 04/29/2010 and no changes required. Colon cancer, hx  03/2009 Congestive heart failure Hypertension GERD Hyperlipidemia OAB crohns - dx 02/2010 blood transfusion (1978) microscopic hematura, chronic Osteoarthritis - 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 Crohn's disease 2011  MD roster: GI - [facility] - [facility] - [facility] card - h [place]  Past Surgical History: Reviewed history from 04/29/2010 and no changes required. Caesarean sectionx3 Hysterectomy (1994) Laprotomy for small bowel obstructions. (03/05/09) right hemicolectomy T1N0 colon cancer 03/2009 Cholecystectomy (1995) Tonsillectomy (1952) Appendectomy (1964) Hand surgery (1991)  Knee surgery, bilateral (1981) Knee replacement right knee 12/11  Family History: Reviewed history from 02/18/2010 and  no changes required. Family History of Arthritis (parent, other realtive) Family History of Colon CA 1st degree relative <60 (parent, other relative) Family History Hypertension (parent) Kidney disease (parent) Throat cancer (parent)   Social History: Reviewed history from 02/16/2010 and no changes required. 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  The patient denies allergy/sinus, anemia, anxiety-new, arthritis/joint pain, blood in urine, breast changes/lumps, change in vision, confusion, cough, coughing up blood, depression-new, fainting, fatigue, fever, headaches-new, hearing problems, heart murmur, heart rhythm changes, itching, menstrual pain, muscle pains/cramps, night sweats, nosebleeds, pregnancy symptoms, shortness of breath, skin rash, sleeping problems, sore throat, swelling of feet/legs, swollen lymph glands, thirst - excessive , urination - excessive , urination changes/pain, urine leakage, vision changes, and voice change.    Physical Exam  General:  Well developed female, obviously doesn't feel well but in no acute distress. Head:  Normocephalic and atraumatic. Eyes:  Conjunctiva pink, no icterus.  Mouth:  Mucous membranes slightly dry. Neck:  no obvious masses  Lungs:  Clear throughout to auscultation. Heart:  Regular rate and rhythm; no murmurs, rubs,  or bruits. Abdomen:  Soft, nondistended. Active bowel sounds. Mild to moderate epigastric tenderness  Msk:  Symmetrical with no gross deformities. Normal posture. Extremities:  No palmar erythema, no edema.  Neurologic:  Alert and  oriented x4;  grossly normal neurologically. Skin:  Intact without significant lesions or rashes. Cervical Nodes:  No significant cervical adenopathy. Psych:  Alert and cooperative. Normal mood and affect.   Impression & Recommendations:  Problem # 1:  NAUSEA AND VOMITING (ICD-787.01) Assessment New Acute onset of nausea, vomiting, upper abdominal pain. Initially started with a loose stool but then constipated for last two days. Surprisingly,  surveillance colonoscopy (history of CRC) in Oct. 2011 showed some ileal ulcers with path suggesting Crohn's. Patient did  well after  6-8 week course of Entocort. In fact, she was doing well when seen by Dr. Carlean Purl a week ago. By exam she does not appear to have a bowel obstruction but given inability to hold anything down / abdominal pain I suggested hospital admission. Patient and husband wanted to try outpatient workup and treatment instead. Overall patient looks okay, vital signs are stable. Will obtain flat and upright of the abdomen along with some basic labs and a urinalysis. Start Phenergan suppositories. Depending on test results and clinical course patient may ultmately require admission.  Orders: T-Abdomen 2-view (74020TC) TLB-CBC Platelet - w/Differential (85025-CBCD) TLB-CMP (Comprehensive Metabolic Pnl) (06301-SWFU) TLB-CRP-High Sensitivity (C-Reactive Protein) (86140-FCRP) TLB-Amylase (82150-AMYL) TLB-Lipase (83690-LIPASE) TLB-Udip w/ Micro (81001-URINE)  Problem # 2:  RENAL CALCULUS, HX OF (ICD-V13.01) Assessment: Comment Only Check U/A  Problem # 3:  ? of CROHN'S DISEASE, LARGE AND SMALL INTESTINES (ICD-555.2) Assessment: Comment Only See #1.   Problem # 4:  ADENOCARCINOMA, COLON, HX OF (ICD-V10.05) Assessment: Comment Only  S/P right hemicolectomy Dec. 2010. She just had surveillance colonoscopy.  Orders: T-Abdomen 2-view (74020TC)  Problem # 5:  GERD (ICD-530.81) Assessment: Deteriorated Acute exacerbation of GERD symptoms.  Samples of Nexium given.   Patient Instructions: 1)  Please go to lab, basement level. 2)  Go to our X-ray deparment also in the basement level, order given. 3)  We have sent a perscription for the Suppositories for nausea. 4)  When you get home use a suppository.  5)  Take a Nexium once you get home. 6)  We have given you Nexium 40 mg to take 30 min before first meal of the day. 7)  Copy sent to : Dr Sherrye Payor 8)  The medication list was reviewed and reconciled.  All changed / newly prescribed medications were explained.  A complete medication list  was provided to the patient / caregiver. Prescriptions: PROMETHAZINE HCL 25 MG SUPP (PROMETHAZINE HCL) Use 1 suppository rectally for nausea  #10 x 1   Entered by:   Marisue Humble NCMA   Authorized by:   Tye Savoy NP   Signed by:   Marisue Humble NCMA on 05/05/2010   Method used:   Electronically to        [facility] [phone]* (retail)       [address]       [place], [place]  [address]       Ph: [phone]       Fax: [phone]   RxID:   [id number]    Orders Added: 1)  T-Abdomen 2-view [74020TC] 2)  TLB-CBC Platelet - w/Differential [85025-CBCD] 3)  TLB-CMP (Comprehensive Metabolic Pnl) [83338-VANV] 4)  TLB-CRP-High Sensitivity (C-Reactive Protein) [86140-FCRP] 5)  TLB-Amylase [82150-AMYL] 6)  TLB-Lipase [83690-LIPASE] 7)  TLB-Udip w/ Micro [81001-URINE]

## 2010-05-11 NOTE — Progress Notes (Signed)
Summary: Triage  Phone Note Call from Patient Call back at [phone]   Caller: Patient Call For: Dr. Carlean Purl Reason for Call: Talk to Nurse Summary of Call: Nausea, acid reflux, weight loss x2 days Initial call taken by: Webb Laws,  May 05, 2010 8:30 AM  Follow-up for Phone Call        patient will come in today and see Tye Savoy RNP for nausea and vomiting and abdominal pain. Follow-up by: Barb Merino RN, CGRN,  May 05, 2010 10:18 AM

## 2010-05-13 ENCOUNTER — Ambulatory Visit (INDEPENDENT_AMBULATORY_CARE_PROVIDER_SITE_OTHER): Payer: Managed Care, Other (non HMO) | Admitting: Internal Medicine

## 2010-05-13 ENCOUNTER — Encounter: Payer: Self-pay | Admitting: Internal Medicine

## 2010-05-13 DIAGNOSIS — R112 Nausea with vomiting, unspecified: Secondary | ICD-10-CM

## 2010-05-13 DIAGNOSIS — K5080 Crohn's disease of both small and large intestine without complications: Secondary | ICD-10-CM

## 2010-05-13 DIAGNOSIS — R109 Unspecified abdominal pain: Secondary | ICD-10-CM

## 2010-05-13 DIAGNOSIS — R1013 Epigastric pain: Secondary | ICD-10-CM | POA: Insufficient documentation

## 2010-05-16 ENCOUNTER — Other Ambulatory Visit (AMBULATORY_SURGERY_CENTER): Payer: Managed Care, Other (non HMO) | Admitting: Internal Medicine

## 2010-05-16 ENCOUNTER — Encounter: Payer: Self-pay | Admitting: Internal Medicine

## 2010-05-16 DIAGNOSIS — R1013 Epigastric pain: Secondary | ICD-10-CM

## 2010-05-16 DIAGNOSIS — R110 Nausea: Secondary | ICD-10-CM

## 2010-05-16 HISTORY — PX: UPPER GASTROINTESTINAL ENDOSCOPY: SHX188

## 2010-05-18 NOTE — H&P (Signed)
NAMENAW, LASALA            ACCOUNT NO.:  [id number]  MEDICAL RECORD NO.:  [id number]           PATIENT TYPE:  I  LOCATION:  4696                         FACILITY:  [facility]  PHYSICIAN:  Sandy Salaam. Deatra Ina, MD,FACGDATE OF BIRTH:  09/03/1950  DATE OF ADMISSION:  05/06/2010 DATE OF DISCHARGE:                             HISTORY & PHYSICAL   PROBLEM:  Abdominal pain, nausea, and vomiting.  HISTORY:  Kyrin is a pleasant 60 year old female, known to Dr. Carlean Purl, who has history of colon cancer diagnosed in December 2010. She underwent a right hemicolectomy and did not require any adjuvant chemo or radiation therapy.  She did develop a small bowel obstruction approximately 1 month prior to her surgery and had an exploratory lap with lysis of adhesions.  She does have history of prior obstructions as well and has had several abdominal surgeries.  The patient underwent followup colonoscopy in October 2011 per Dr. Carlean Purl, found to have sigmoid and ileal ulcerations.  Biopsies were done and these were consistent with Crohn's.  This is a new diagnosis for her.  She was treated with a course of Entocort 9 mg daily over the past 8 weeks and did well.  She says she really was not having any symptoms referable to the Crohn's at that time.  At this time, she presents with a 3 to 4-day history of persistent stabbing upper abdominal pain which is primarily in her left upper quadrant and radiates around into her back.  This has been associated with nausea and vomiting and chills but no documented fever.  She has been having difficulty keeping down any p.o.'s.  Says she has no appetite and everything that she tries to eat or drink, comes right back up.  She has developed increased heartburn and reflux as well.  She really has not had diarrhea associated with this illness.  She has been taking hydrocodone because she had a recent knee repair and had actually been using some MiraLax.  She uses  suppository yesterday for nausea and then had an episode of diarrhea which was nonbloody.  She has been on aspirin daily and no NSAID use.  She is not currently on any therapy for her Crohn's.  The patient was seen and evaluated in the office yesterday, had plain abdominal films done which did show a single loop of dilated small bowel in the left upper quadrant, question focal ileus but no obstruction.  Labs were done yesterday with a normal CBC and CMET.  CRP was 1.73. Amylase was normal.  She did have 7-10 rbc's  in her urine but this is a chronic problem for her.  The patient called back today stating she was not feeling any better, was not keeping down any p.o.'s, was feeling dehydrated, and plans were made for admission.  PAST HISTORY:  Pertinent for: 1. Hysterectomy. 2. Cholecystectomy. 3. Appendectomy. 4. She has had bilateral knee replacements, the right knee replaced in     December 2011. 5. C-section x3. 6. She has history of chronic GERD. 7. Congestive heart failure. 8. Hypertension. 9. Hyperlipidemia. 10.Chronic back pain. 11.Has a chronic microscopic hematuria.  MEDICATIONS: 1. Toprol  XL 50 daily. 2. Lisinopril 2.5 daily. 3. Hydrocodone acetaminophen 5/325 p.r.n. 4. MiraLax p.r.n. 5. Phenergan 25 q.6h. p.r.n. 6. Aspirin daily.  ALLERGIES:  MORPHINE.  FAMILY HISTORY:  Positive for colon cancer in her parents and throat cancer in parents.  SOCIAL HISTORY:  The patient is married.  She is a smoker.  No EtOH.  REVIEW OF SYSTEMS:  The patient has had a 5-pound weight loss over the past week.  CARDIOVASCULAR:  Denies any chest pain or anginal symptoms. PULMONARY:  Negative for cough, shortness of breath, or sputum production.  GI: As outlined above.  GU: Denies any dysuria, urgency, or frequency.  MUSCULOSKELETAL:  Positive for chronic back pain.  She says she is in the process of being lined up for an injection in her back. She did have a right knee  replacement done in December and is still using pain medication for that.  PHYSICAL EXAM:  VITAL SIGNS:  Well-developed white female in no acute distress.  Blood pressure 120/83, pulse in the 80s, temperature is 981, sats 97 on room air. GENERAL:  She is alert and oriented x3. HEENT:  Nontraumatic, normocephalic.  EOMI, PERRLA.  Sclerae anicteric. Buccal mucosa somewhat dry. NECK:  Supple.  There is no JVD. LUNGS:  Clear to A and P. CARDIOVASCULAR:  Regular rate and rhythm with S1-S2.  No murmur, rub, or gallop. ABDOMEN:  Soft.  Bowel sounds are hyperactive.  She is tender in the epigastrium and left upper quadrant.  There is no rebound.  No guarding. No mass or palpable hepatosplenomegaly. EXTREMITIES:  Without clubbing, cyanosis, or edema. SKIN:  Benign. NEURO:  The patient is alert and oriented x3.  Exam is grossly nonfocal. PSYCH:  Affect is appropriate.  IMPRESSION: 66. A 60 year old female with 4 to 5-day history of persistent upper     abdominal pain, primarily left upper quadrant, associated with     nausea and vomiting, rule out Crohn's exacerbation.  Rule out     gastritis, peptic ulcer disease, rule out gastroenteritis. 2. History of colon cancer, status post right hemicolectomy in     December of 2010, T1N0M0 lesion. 3. History of small-bowel obstruction secondary to adhesions requiring     laparotomy in January 2010. 4. Hypertension. 5. Recent right knee replacement in December 2011. 6. Status post multiple prior abdominal surgeries including the right     hemicolectomy, hysterectomy, C-section x3, appendectomy, and     cholecystectomy.  PLAN:  The patient is admitted to the service of Dr. Erskine Emery for IV fluid hydration, IV antibiotics, IV Protonix, pain control, clear liquid diet.  We will obtain CT of the abdomen and pelvis in the a.m. Hopefully, at that point, she will be able to keep a contrast down.  She will be placed on Lovenox for DVT prophylaxis and  for details please see the orders.     Amy Esterwood, PA-C   ______________________________ Sandy Salaam Deatra Ina, MD,FACG    AE/MEDQ  D:  05/06/2010  T:  05/06/2010  Job:  [id number]  Electronically Signed by AMY ESTERWOOD PA-C on 05/12/2010 11:23:39 AM Electronically Signed by Erskine Emery MDFACG on 05/18/2010 01:33:35 PM

## 2010-05-19 ENCOUNTER — Telehealth: Payer: Self-pay | Admitting: Internal Medicine

## 2010-05-19 NOTE — Progress Notes (Signed)
Summary: triage  Phone Note Call from Patient Call back at [phone]   Caller: Patient Call For: Dr Carlean Purl Reason for Call: Talk to Nurse Summary of Call: Patient states that she has diarrhea x 5 already today wants to know if theres anything she can take for it. Initial call taken by: Ronalee Red,  May 10, 2010 8:47 AM  Follow-up for Phone Call        Patient has had 6 episodes of diarrhea this am.  She states that she was discharged yesterday from the hospital.  I spoke with Nicoletta Ba PA patient can start on imodium.  She suggested Dr Carlean Purl review and decide if Entocort should be restarted.  patient is also requesting zofran rx.  She is advised that she needs to push fluids and remain on a soft diet.  Dr Carlean Purl please review and advise. Follow-up by: Barb Merino RN, [place],  May 10, 2010 9:14 AM  Additional Follow-up for Phone Call Additional follow up Details #1::        restart Entocort 3 mg capsules take 3  daily please #90 with 1 refill Zofran 4 mg by mouth every 6 hours as needed for nausea #20 no refills please make sure she had c diff and stool culture done when she was hospitalized and if she did not do them she needs to be seen this week by me please or at beginning of next week  Additional Follow-up by: Gatha Mayer MD, [place],  May 10, 2010 10:10 AM    Additional Follow-up for Phone Call Additional follow up Details #2::    Patient advised of Dr Celesta Aver recommendations.  She will come for an appointment with Dr Carlean Purl 05/13/10 3:00. Lab orders entered for today, she is advised to pick up rx from pharmacy. Follow-up by: Barb Merino RN, CGRN,  May 10, 2010 10:46 AM  New/Updated Medications: ENTOCORT EC 3 MG XR24H-CAP (BUDESONIDE) 3 by mouth once daily ONDANSETRON HCL 4 MG TABS (ONDANSETRON HCL) 1 by mouth q 6 hours as needed nausea Prescriptions: ONDANSETRON HCL 4 MG TABS (ONDANSETRON HCL) 1 by mouth q 6 hours as needed nausea  #20 x  0   Entered by:   Barb Merino RN, CGRN   Authorized by:   Gatha Mayer MD, [facility]   Signed by:   Barb Merino RN, CGRN on 05/10/2010   Method used:   Electronically to        [facility] [phone]* (retail)       [place], [place]  [address]       Ph: [phone]       Fax: [phone]   RxID:   [id number] ENTOCORT EC 3 MG XR24H-CAP (BUDESONIDE) 3 by mouth once daily  #90 x 1   Entered by:   Barb Merino RN, CGRN   Authorized by:   Gatha Mayer MD, [facility]   Signed by:   Barb Merino RN, CGRN on 05/10/2010   Method used:   Electronically to        [facility] [phone]* (retail)       [address]       [place], [place]  [address]       Ph: [phone]       Fax: [phone]   RxID:   [id number]

## 2010-05-19 NOTE — Progress Notes (Signed)
Summary: Follow-up and on-call note  Phone Note Call from Patient   Caller: Patient Call For: Marisue Humble CMA Summary of Call: PT called to report she went home and inserted the Promethazine suppository rectally and within 2 minutes she had urgent diarrhea.  I Georga Hacking and she asked if the pt  was able to keep liquids down . I asked the pt and she said she had been eating ice chips.  I told her within the next hour to try 1/2 of a suppository and insert it as far as she can up the rectum per Nevin Bloodgood. I also told her to try to drink liquids  and call me before 5 PM today to let me know. Initial call taken by: Sharol Roussel,  May 05, 2010 3:44 PM  Follow-up for Phone Call        she called back to the office and I reviewed results-she has not vomited since 3 PM still nauseous has gotten some liquids in no fever I reviewed lab and xray findings she has had chronic microhematuria so that is not new She may go to hospital tonight if unable to keep liquids in but will try to continue with promethazine and force small amounts of liquids frequently I told her we would call back tomorrow, if not better or worse we can arrange adnmit then if needed - if deteriorates overnight go to ED ? acute gastroenteritis, ? partial SBO, ? Crohn's issue or some  combination  Follow-up by: Gatha Mayer MD, Marval Regal,  May 05, 2010 6:16 PM  Additional Follow-up for Phone Call Additional follow up Details #1::        Pt called in this Am.  She did the Promethazine Suppository at 2 AM and it has not helped. She did not take her hydrocodone , afraid it would make her more nauseous. She took her Nexium this AM with no water.  Afraid drinking liquids will contribute to the nausea.  Nevin Bloodgood suggested we send RX for Zofran  and she should take the hydrocodone and drink liquids.  The pt said she wants to go to the ER.  Nevin Bloodgood will check with Dr Carlean Purl about how to do this. Additional Follow-up by: Sharol Roussel,  May 06, 2010 10:00 AM    Additional Follow-up for Phone Call Additional follow up Details #2::    Called pt to advise per Tye Savoy ACNP, we are on a waiting list for [facility] today to get her a bed at [facility].  Patient Placement is to call us when they have a bed.  The Bed Placement dept didn't call me back.  I called them again and the pt was already in her room.   Follow-up by: Sharol Roussel,  May 09, 2010 8:33 AM

## 2010-05-19 NOTE — Assessment & Plan Note (Addendum)
Summary: post hospital- gastritis/sheri   History of Present Illness Visit Type: Follow-up Visit Primary GI MD: Silvano Rusk MD [facility] Primary Provider: Rowe Clack MD Requesting Provider: n/a Chief Complaint: Post hospital for gastritis. History of Present Illness:   She is post hospital for gastritis but since discharge she is still having alot of nausea and some abdominal pain. She has stopped all meds for nausea and her pain medicaiton. She does not feel that the entocort is helping her symptoms.   She says she is having same problems as pre-hospital. She stopped all the pain meds and nausea meds. She started with some shakes after the narcotics were stopped and Dr. Asa Lente prescrbed some low-dose alprazolam.  diarrhea gone but now having urge to defecate with only small amount produced. She says that the diarrhea had started before she stopped her narcotics.    GI Review of Systems    Reports abdominal pain and  nausea.     Location of  Abdominal pain: generalized.    Denies acid reflux, belching, bloating, chest pain, dysphagia with liquids, dysphagia with solids, heartburn, loss of appetite, vomiting, vomiting blood, weight loss, and  weight gain.        Denies anal fissure, black tarry stools, change in bowel habit, constipation, diarrhea, diverticulosis, fecal incontinence, heme positive stool, hemorrhoids, irritable bowel syndrome, jaundice, light color stool, liver problems, rectal bleeding, and  rectal pain.    Current Medications (verified): 1)  Toprol Xl 50 Mg Xr24h-Tab (Metoprolol Succinate) .... One Tablet By Mouth Once Daily 2)  Lisinopril 2.5 Mg Tabs (Lisinopril) .... Take 1 Tablet By Mouth Once A Day 3)  Promethazine Hcl 25 Mg Tabs (Promethazine Hcl) .... One Half To A Whole  Tablet By Mouth  Every 6 Hours As Needed For Nausea 4)  Nexium 40 Mg Cpdr (Esomeprazole Magnesium) .... Take 1 Capsule 30 Min Prior To Breakfast 5)  Entocort Ec 3 Mg Xr24h-Cap  (Budesonide) .... 3 By Mouth Once Daily 6)  Alprazolam 0.5 Mg Tabs (Alprazolam) .[name].. 1 By Mouth Every 6 Hours As Needed For Anxiety Symptoms  Allergies (verified): 1)  ! Morphine  Past History:  Past Medical History: Reviewed history from 04/29/2010 and no changes required. Colon cancer, hx  03/2009 Congestive heart failure Hypertension GERD Hyperlipidemia OAB crohns - dx 02/2010 blood transfusion (1978) microscopic hematura, chronic Osteoarthritis - 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 Crohn's disease 2011  MD roster: GI - [facility] - [facility] - [facility] - h [place]  Past Surgical History: Reviewed history from 04/29/2010 and no changes required. Caesarean sectionx3 Hysterectomy (1994) Laprotomy for small bowel obstructions. 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 Heart:  Regular rate and rhythm; no murmurs, rubs,  or bruits. Abdomen:  soft BS+ and increased mildly tender in epigastrium but not xiphoid also mildly tender in the lower abdomen Psych:  mildly flat affect   Impression & Recommendations:  Problem # 1:  ABDOMINAL PAIN, UPPER (ICD-789.09) continue Nexium started last week EGD to look fr upper Crohn's or other cause ? functional problems also  Problem # 2:  NAUSEA AND VOMITING (ICD-787.01)  Orders: EGD (EGD)  Problem # 3:  ? of CROHN'S DISEASE, LARGE AND SMALL INTESTINES (ICD-555.2) Assessment: Comment Only stay on Entocort diarrhea is better not clear what cause of that was/is  Patient Instructions: 1)  You have been scheduled for an endoscopy. Please follow written prep instructions that were given to you today at your visit. 2)  We have given you Nexium samples to take 1 capsule by mouth once daily until your endoscopy. 3)  The medication list was reviewed and reconciled.  All changed / newly prescribed medications were explained.  A complete medication list was provided to the patient / caregiver.  Appended Document: post hospital- gastritis/sheri   Impression & Recommendations:  Problem # 1:  ADENOCARCINOMA, COLON, HX OF (ICD-V10.05) Assessment Comment Only I had recommended genetics counselling in past. She declined to do blood testing when contacted by genetecist so will not proceed further.  Problem # 2:  FAMILY HISTORY OF COLON CA 1ST DEGREE RELATIVE <60 (ICD-V16.0) Assessment: Comment Only

## 2010-05-19 NOTE — Op Note (Signed)
Summary: Right Hemicolectomy  NAME:  Erica [name]            ACCOUNT NO.:  [id number]      MEDICAL RECORD NO.:  [id number]          PATIENT TYPE:  INP      LOCATION:  1522                         FACILITY:  [facility]      PHYSICIAN:  [name] T. Hoxworth, M.D.DATE OF BIRTH:  20-Mar-1951      DATE OF PROCEDURE:  03/05/2009   DATE OF DISCHARGE:                                  OPERATIVE REPORT      PREOPERATIVE DIAGNOSIS:  Carcinoma of the right colon.      POSTOPERATIVE DIAGNOSIS:  Carcinoma of the right colon.      SURGICAL PROCEDURES:  Right hemicolectomy with anastomosis.      ASSISTANT:  Dickey Gave, M.D.      ANESTHESIA:  General.      BRIEF HISTORY:  Erica [name] is a 60 year old female with a strong   family history of colon cancer.  She recently underwent screening by Dr.   Silvano Rusk with a colonoscopy and was found to have a sessile polyp in   the right colon near the hepatic flexure.  This was mostly removed and   the area tattooed.  Pathology has returned showing invasive   adenocarcinoma.  We have recommended proceeding with right   hemicolectomy.  The patient has had previous laparotomy for small-bowel   obstruction after several GYN procedures and I have elected to use an   open procedure.  The nature of procedure, its indications, anesthetic   risks, risks of bleeding, infection, anastomotic leak and others were   discussed and understood.  She is now brought to Operating Room for this   procedure.      DESCRIPTION OF OPERATION:  The patient brought to the Operating Room and   placed in supine position on the operating table and general   endotracheal anesthesia was induced.  Foley catheter was placed.  The   abdomen was widely sterilely prepped and draped.  She received   preoperative IV antibiotics.  She had undergone mechanical bowel prep.   Correct patient and procedure were verified.      I elected to use a right transverse mid abdominal  incision.  Dissection   was carried down through the subcutaneous tissue, fascial and muscle   layers using cautery and the peritoneum entered under direct vision.   The patient had fairly extensive adhesions to her previous low midline   incision and in the pelvis due to her previous operations.  Extensive   adhesiolysis was performed over about the next 45 minutes, mobilizing   the omentum and multiple small bowel loops up away from the midline and   out of the pelvis.  Eventually, the entire small bowel was completely   freed down to the terminal ileum and the ileocecal valve.  The tattoo   was apparent in the distal ascending colon.  Following this, the right   colon was extensively mobilized.  Lateral peritoneal attachments were   divided, mobilizing the right colon up out of the retroperitoneum.   There were some adhesions of the cecum down  to the pelvis which were   also lysed due to the patient's previous appendectomy, and then the   cecum was mobilized up into the incision.  The right ureter was   identified along its course and protected.      A point of resection at the proximal transverse colon was chosen.  This   area was cleaned of omentum.  The hepatic flexure was mobilized dividing   the hepatocolic ligament with the LigaSure device.  The hepatic flexure   and distal right colon were mobilized out of the retroperitoneum.  The   duodenum was identified and protected.  After complete mobilization, the   mesentery was resected taking the ileocolic and right colic vessels near   their origins and completely dividing the rest of the mesentery with the   LigaSure out to the proposed areas of resection at the terminal ileum   near the ileocecal valve and the proximal transverse colon.  Following   division of mesentery, a functional end-to-end anastomosis was created   between the terminal ileum and transverse colon with single firing of   the Endo GIA 75 mm stapler.  The common  enterotomy was closed and the   specimen removed with a firing of the TA-60 stapler.      The specimen was then opened on the back table and the ulcerated area   was identified where the tattoo was confirming removal of the lesion.   Gloves and instruments were changed.  The mesenteric defect was closed   with interrupted silks.  The small bowel was carefully inspected and was   uninjured.  The viscera were returned to their anatomic position.  The   abdomen was irrigated with saline and complete hemostasis assured.  The   incision was then closed in layers with a running #0 PDS.  The skin was   closed with staples.  Sponge, needle and instrument counts were correct.   The patient was taken to the Recovery Room in good condition.               [name]. Hoxworth, M.D.   Electronically Signed            BTH/MEDQ  D:  03/05/2009  T:  03/05/2009  Job:  [id number]

## 2010-05-19 NOTE — Discharge Summary (Signed)
Summary: Adenocarcionma Descending Colon  NAME:  Erica [name], Erica [name]            ACCOUNT NO.:  [id number]      MEDICAL RECORD NO.:  [id number]          PATIENT TYPE:  INP      LOCATION:  1522                         FACILITY:  [facility]      PHYSICIAN:  [name] T. Hoxworth, M.D.DATE OF BIRTH:  1951-03-08      DATE OF ADMISSION:  03/05/2009   DATE OF DISCHARGE:  03/08/2009                                  DISCHARGE SUMMARY      DISCHARGE DIAGNOSIS:  Adenocarcinoma of the descending colon.      SURGICAL PROCEDURES:  Open right hemicolectomy, surgeon Dr. Excell Seltzer.   Date of surgery was March 05, 2009.      HISTORY:  [name] is a 60 year old female with a strong family   history of colon cancer.  She recently underwent her routine screening   colonoscopy by Silvano Rusk, MD, was found to have an 18-mm sessile   polyp in the hepatic flexure.  This was snared and pathology has   revealed invasive carcinoma down to the base of the polyp.  She has had   a negative CT scan of the pelvis and workup with no evidence of   metastatic disease.  There is a small density in the adrenal gland, felt   to be consistent with a benign adenoma.  Following mechanical and   antibiotic bowel prep, the patient was admitted for elective colectomy.      PAST MEDICAL HISTORY:  Previous surgery includes laparotomy and lysis of   adhesions for small-bowel obstruction by me in January of this year.   She had had three previous C-sections, laparoscopic cholecystectomy,   ruptured ectopic pregnancy and hysterectomy through Pfannenstiel   incisions.  She has had an episode of chest pain felt to be coronary   spasm with a negative catheterization. She is also followed for   hypertension.      MEDICATIONS ON ADMISSION:  Medications are metoprolol 25 daily,   lisinopril 2.5 daily, simvastatin 20 daily, aspirin 325 daily.      ALLERGIES:  None.      PERTINENT EXAM:  GENERAL APPEARANCE:  Healthy-appearing, in no  acute   distress.   ABDOMEN:  Shows a well-healed midline incision; soft, nontender.  No   masses.      HOSPITAL COURSE:  The patient underwent open right hemicolectomy on   12/03 through a right paramedian incision.  She had numerous adhesions   that were lysed and the procedure went smoothly.  Her bowel function   returned very rapidly, having bowel movements on the second   postoperative day, and was advanced quickly to a regular diet.  She had   minimal discomfort.  She is discharged home on the third postoperative   day.  Tolerating a regular diet.  The abdomen is soft, it is nontender.   Wound healing primarily.      Final pathology subsequently returned showing a 0.2 cm adenocarcinoma   confined to the lamina propria with 23 negative lymph nodes, a Tis, N0,   M0.  [name]. Hoxworth, M.D.   Electronically Signed            BTH/MEDQ  D:  03/17/2009  T:  03/18/2009  Job:  [id number]      cc:   Gatha Mayer, MD,FACG   [facility]   [address] [place], [place] [address]

## 2010-05-19 NOTE — Discharge Summary (Signed)
Summary: Post Op Small Bowel Obstruction       NAME:  Erica [name], Erica [name]            ACCOUNT NO.:  [id number]      MEDICAL RECORD NO.:  [id number]          PATIENT TYPE:  INP      LOCATION:  [place]                         FACILITY:  [facility]      PHYSICIAN:  [name] T. Hoxworth, M.D.DATE OF BIRTH:  1950-10-27      DATE OF ADMISSION:  03/13/2009   DATE OF DISCHARGE:  03/18/2009                                  DISCHARGE SUMMARY      DISCHARGE DIAGNOSIS:  Postop small-bowel obstruction.      OPERATIONS AND PROCEDURES:  None.      HISTORY OF PRESENT ILLNESS:  [name] is a 60 year old female, status   post right hemicolectomy on March 05, 2009 for early stage carcinoma   of the right colon.  The patient has a history of several previous   episodes of small-bowel obstruction prior to this diagnosis, 2 requiring   laparotomy, the last one being in February 2010.  The patient was   readmitted on 12/08 after her surgery for partial small-bowel   obstruction that resolved quickly and she was discharged home just   yesterday.  She now presents again with recurrent crampy abdominal pain   and nausea.  She continues to have some loose bowel movements.      PAST MEDICAL HISTORY:  Surgery as above plus remote C-section x3,   hysterectomy and ruptured ectopic pregnancy.  Medically she is followed   for hypertension, has a recent history of coronary spasm.      MEDICATIONS:   1. Lisinopril 2.5 daily.   2. Metoprolol ER 50 daily.   3. Zocor 20 daily.      ALLERGIES:  MORPHINE causes nausea and vomiting.      For social history, family history, review of systems see previous   detailed H and P.      PERTINENT PHYSICAL EXAM:  VITAL SIGNS:  She is afebrile.  Blood pressure   157/98, heart rate 91, respirations 18.   GENERAL:  Well-developed female, obviously uncomfortable.      Pertinent findings limited to the abdomen.  There is a healing incision   without infection or hernia.  There is  moderate right-sided tenderness   with some slight guarding.  Bowel sounds present.      LABORATORY:  White count 6.1, hemoglobin 11.8.  Flat and upright   abdominal x-ray shows moderately dilated small bowel in the left abdomen   consistent with partial small-bowel obstruction.      HOSPITAL COURSE:  The patient was admitted, NG tube placed, started on   IV fluids, pain and nausea medication.  By the following morning she was   feeling significantly better with occasional crampy pain but much   decreased.  She had not had a bowel movement or flatus.  White count   remained normal.  Flat and upright abdomen showed some improvement.  The   treatment was continued.  On 12/13 she had flatus and loose bowel   movement, still had some mild cramping, KUB  showed improving but not   resolved small-bowel obstruction.  By 12/14 she continued to feel better   and had a large loose bowel movement, had no pain.  KUB showed a couple   of minimally dilated loops of small bowel, continued to improve.  Her NG   tube was removed.  By 12/15 she had minimal discomfort and was   advanced to a full liquid diet.  By 12/16 KUB showed her abdominal   status and was completely resolved.  She had multiple loose stools and   gas passing. Had no pain and was tolerating diet.  She is discharged   home at this time.  Follow-up will be with me in the office in 2 weeks.               [name]. Hoxworth, M.D.            [place]  D:  05/03/2009  T:  05/03/2009  Job:  [id number]         Electronically Signed by Excell Seltzer M.D. on 05/08/2009 06:16:17 PM

## 2010-05-19 NOTE — Discharge Summary (Signed)
Summary: Abdominal Pain Post Colectomy  NAME:  Erica [name], Erica [name]            ACCOUNT NO.:  [id number]      MEDICAL RECORD NO.:  [id number]          PATIENT TYPE:  INP      LOCATION:  [place]                         FACILITY:  [facility]      PHYSICIAN:  [name] T. Hoxworth, M.D.DATE OF BIRTH:  1950-09-26      DATE OF ADMISSION:  03/19/2009   DATE OF DISCHARGE:  03/23/2009                                  DISCHARGE SUMMARY      DISCHARGE DIAGNOSES:   1. Abdominal pain post colectomy.   2. Enteritis, uncertain etiology.      HISTORY:  Erica [name] is a 60 year old female status post right   hemicolectomy open procedure on March 05, 2009 for early stage   adenocarcinoma of the right colon.  She has been readmitted twice   previously with abdominal pain and x-rays and physical findings   consistent with small bowel obstruction that resolved.  The patient was   just discharged 1 day ago feeling well.  Tolerating diet.  Negative x-   rays.  She now presents with 12 hours of increasing nausea lower   abdominal pain and reflux and weakness.  She had a bowel movement last   night.  The patient has a history of multiple bowel obstructions and 2   laparotomies for bowel obstruction, last being in February 2010.      PAST MEDICAL HISTORY:  As above with history of appendectomy,   cholecystectomy.      MEDICATIONS ON ADMISSION:   1. Metoprolol 25.   2. Lisinopril 2.5 daily.   3. Simvastatin 25 daily.   4. Aspirin.      ALLERGIES:  None.      PHYSICAL EXAM:  VITAL SIGNS:  She was afebrile.  Vital signs within   normal limits.   ABDOMEN:  Pertinent findings of the abdomen showed a healing right   transverse midabdominal incision and old midline incision.  There is   mild lower abdominal tenderness and some fullness in the right lower   quadrant.  KUB is negative.      HOSPITAL COURSE:  The patient was admitted and made n.p.o., started on   IV fluids and symptomatic management.  CT scan of the  abdomen was   obtained which revealed some thickening and inflammatory change around   small bowel loops in the mid abdomen with normal appearing terminal   ileum and anastomosis and colon.   With bowel rest she quickly improved.  By December 20 her pain had   resolved, nausea was gone, and her diet was advanced.  On December 21   she was feeling well, tolerating regular diet and had a couple normal   bowel movements.  Abdomen was benign.  She is discharged home at this   time. Followup is to be in my office in 2 weeks.               [name]. Hoxworth, M.D.   Electronically Signed            BTH/MEDQ  D:  04/05/2009  T:  04/05/2009  Job:  [id number]

## 2010-05-19 NOTE — Letter (Signed)
Summary: EGD Instructions  [facility]  [place], [place] [address]   Phone: [phone]  Fax: [phone]       Erica [name]    12-Jun-1950    MRN: [id number]       Procedure Day /Date: Monday 05/16/10     Arrival Time: 2:30 pm     Procedure Time: 3:30 pm     Location of Procedure:                    _x  _ [place] (4th Floor)  PREPARATION FOR ENDOSCOPY   On 05/16/10 THE DAY OF THE PROCEDURE:  1.   No solid foods, milk or milk products are allowed after midnight the night before your procedure.  2.   Do not drink anything colored red or purple.  Avoid juices with pulp.  No orange juice.  3.  You may drink clear liquids until 1:30 pm which is 2 hours before your procedure.                                                                                                CLEAR LIQUIDS INCLUDE: Water Jello Ice Popsicles Tea (sugar ok, no milk/cream) Powdered fruit flavored drinks Coffee (sugar ok, no milk/cream) Gatorade Juice: apple, white grape, white cranberry  Lemonade Clear bullion, consomm, broth Carbonated beverages (any kind) Strained chicken noodle soup Hard Candy   MEDICATION INSTRUCTIONS  Unless otherwise instructed, you should take regular prescription medications with a small sip of water as early as possible the morning of your procedure.                   OTHER INSTRUCTIONS  You will need a responsible adult at least 61 years of age to accompany you and drive you home.   This person must remain in the waiting room during your procedure.  Wear loose fitting clothing that is easily removed.  Leave jewelry and other valuables at home.  However, you may wish to bring a book to read or an iPod/MP3 player to listen to music as you wait for your procedure to start.  Remove all body piercing jewelry and leave at home.  Total time from sign-in until discharge is approximately 2-3 hours.  You should  go home directly after your procedure and rest.  You can resume normal activities the day after your procedure.  The day of your procedure you should not:   Drive   Make legal decisions   Operate machinery   Drink alcohol   Return to work  You will receive specific instructions about eating, activities and medications before you leave.    The above instructions have been reviewed and explained to me by   _______________________    I fully understand and can verbalize these instructions _____________________________ Date _________

## 2010-05-19 NOTE — Progress Notes (Signed)
Summary: Pls call  Phone Note Call from Patient   Caller: Patient [phone] Summary of Call: Pt called requesting a call from VAL only. Initial call taken by: Crissie Sickles, [place],  May 10, 2010 4:46 PM  Follow-up for Phone Call        wanted to discuss anxiety assoc with diarrhea - will fax rx for xanax to use as needed - pt aware of same Follow-up by: Rowe Clack MD,  May 11, 2010 1:08 PM  Additional Follow-up for Phone Call Additional follow up Details #1::        Rx faxed to pharmacy Additional Follow-up by: Crissie Sickles, [place],  May 11, 2010 1:11 PM    New/Updated Medications: ALPRAZOLAM 0.5 MG TABS (ALPRAZOLAM) 1 by mouth every 6 hours as needed for anxiety symptoms Prescriptions: ALPRAZOLAM 0.5 MG TABS (ALPRAZOLAM) 1 by mouth every 6 hours as needed for anxiety symptoms  #40 x 0   Entered and Authorized by:   Rowe Clack MD   Signed by:   Rowe Clack MD on 05/11/2010   Method used:   Printed then faxed to ...       [facility] [phone]* (retail)       [address]       [place], [place]  [address]       Ph: [phone]       Fax: [phone]   RxID:   [id number]

## 2010-05-20 ENCOUNTER — Telehealth: Payer: Self-pay | Admitting: Internal Medicine

## 2010-05-25 NOTE — Progress Notes (Addendum)
Summary: OIN CALL: Medication intolerance  Phone Note Call from Patient   Caller: Patient Call For: Dr. Carlean Purl Details for Reason: "medicine makes me sick" Summary of Call: Chart reviewed. Patient called crying stating that Entocort makes her sick every time that she takes it.Marland KitchenMarland KitchenOtherwise ok.  Told to hold Entocort and call Dr. Carlean Purl / his nurse Monday morning for further advise. I will forward note to Dr Carlean Purl... Initial call taken by: Irene Shipper MD,  May 20, 2010 10:07 PM     Appended Document: OIN CALL: Medication intolerance please call her and reassess I am puzzled because she did not have any problems with the Entocort (I think) when she took it before. She is gong to need another office visit next week or so   Appended Document: OIN CALL: Medication intolerance Left message for patient to call back    Appended Document: OIN CALL: Medication intolerance nausea, vomiting, abdominal pain after each dose of Entocort.  Unable to eat or drink.  She has been better since stopping Entocort.  100 % better since stopping Entocort.  No further diarrhea. " I am so happy since I spent a couple of good days. "  Appended Document: OIN CALL: Medication intolerance ok - keep follow-up as planned for 3/15

## 2010-05-25 NOTE — Progress Notes (Signed)
Summary: not very specific -Wants to speak to nurse  Phone Note Call from Patient Call back at Home Phone [phone]   Call For: Dr Carlean Purl Reason for Call: Talk to Nurse Summary of Call: Wants to discuss her medications Initial call taken by: Irwin Brakeman [facility],  May 19, 2010 8:38 AM  Follow-up for Phone Call        Patient stated she is having trouble eating. Patient has stopped her pain meds for back problems about the same time she started the ENTOCORT. Now, patient is having nausea that occurs about an hour after taking ENTOCORT. Patient stated she has lost weight and can't get her strength back. Patient would like to know if she can wean herself off the ENTOCORT until she sees you in March? If OK, weaning process. Thanks. Shella Maxim RN  May 19, 2010 9:49 AM   Additional Follow-up for Phone Call Additional follow up Details #1::        I do not recommend that now How is she taking the Entocort? If not taking three times a day have heer take 1 capsule 3 times a day  Let me know Additional Follow-up by: Gatha Mayer MD, [facility],  May 19, 2010 11:46 AM    Additional Follow-up for Phone Call Additional follow up Details #2::    Patient stated she was taking 3 pills at one time. Patient will try 1 tab q8hr and update [place]. Shella Maxim RN  May 19, 2010 1:20 PM  Ok. Gatha Mayer MD, [facility]  May 20, 2010 8:33 AM

## 2010-05-25 NOTE — Progress Notes (Signed)
Summary: Genetic Counseling referral  Phone Note Outgoing Call Call back at [facility] Phone [phone]   Call placed by: Barb Merino RN, CGRN,  May 02, 2010 8:37 AM Call placed to: Reneee at the [place] of Call: i have left a voicemail for [place], new patient coordinator at the [facility] asking her to update me on the status of the referral from November. Initial call taken by: Barb Merino RN, [place],  May 02, 2010 8:41 AM  Follow-up for Phone Call        I have had no return call from [place] at the [facility].  I have sent the referral again. Barb Merino RN, [facility]  May 09, 2010 10:35 AM  I have left another voicemail for Joseph Art at the [facility] inquiring on the status of the referral. Follow-up by: Barb Merino RN, CGRN,  May 13, 2010 9:16 AM  Additional Follow-up for Phone Call Additional follow up Details #1::        patient has prviously met with the Genetic Coordinator last March, she declined to go through with the lab work.  per Dr Carlean Purl need to cancel the referral. Additional Follow-up by: Barb Merino RN, [place],  May 17, 2010 1:08 PM

## 2010-05-25 NOTE — Procedures (Signed)
Summary: Upper Endoscopy  Patient: Erica [name] Note: All result statuses are Final unless otherwise noted.  Tests: (1) Upper Endoscopy (EGD)   EGD Upper Endoscopy       [facility]     [place], [place]  [id number]           ENDOSCOPY PROCEDURE REPORT           PATIENT:  Erica, [name]  MR#:  [id number]     BIRTHDATE:  December 06, 1950, 59 yrs. old  GENDER:  female           ENDOSCOPIST:  Gatha Mayer, MD, [facility]           PROCEDURE DATE:  05/16/2010     PROCEDURE:  EGD, diagnostic 74944     ASA CLASS:  Class II     INDICATIONS:  epigastric pain, nausea           MEDICATIONS:   Fentanyl 75 mcg IV, Versed 8 mg IV     TOPICAL ANESTHETIC:  Exactacain Spray           DESCRIPTION OF PROCEDURE:   After the risks benefits and     alternatives of the procedure were thoroughly explained, informed     consent was obtained.  The LB [id number] endoscope was     introduced through the mouth and advanced to the second portion of     the duodenum, without limitations.  The instrument was slowly     withdrawn as the mucosa was fully examined.     <<PROCEDUREIMAGES>>           The upper, middle, and distal third of the esophagus were     carefully inspected and no abnormalities were noted. The z-line     was well seen at the GEJ. The endoscope was pushed into the fundus     which was normal including a retroflexed view. The antrum,gastric     body, first and second part of the duodenum were unremarkable.     Retroflexed views revealed no abnormalities.    The scope was then     withdrawn from the patient and the procedure completed.           COMPLICATIONS:  None           ENDOSCOPIC IMPRESSION:     1) Normal EGD     RECOMMENDATIONS:     1) Take Nexium 40 mg daily for two more weeks and stop.     2) Continue other medications until you see Dr. Carlean Purl     3) See Dr. Carlean Purl in 1 month - will discuss further evaluation     (if needed) and  long-term therapy of Crohn's disease then           Gatha Mayer, MD, [facility]           CC:  The Patient           n.     eSIGNED:   Gatha Mayer at 05/16/2010 04:32 PM           [name], [id number]  Note: An exclamation mark (!) indicates a result that was not dispersed into the flowsheet. Document Creation Date: 05/16/2010 4:33 PM _______________________________________________________________________  (1) Order result status: Final Collection or observation date-time: 05/16/2010 16:23 Requested date-time:  Receipt date-time:  Reported date-time:  Referring Physician:   Ordering Physician: Silvano Rusk (  [id number]) Specimen Source:  Source: Tawanna Cooler Order Number: [id number] Lab site:   Appended Document: Upper Endoscopy Lmom for patient to return my call. Per Dr Carlean Purl, she needs a f/u appointment 1 month post EGD.  Appended Document: Upper Endoscopy Lmom for patient to call back.  Appended Document: Upper Endoscopy Scheduled for for f/u for 06/16/10.  Dr Carlean Purl, patient wants to know if a can of V8 daily is ok and can she use Ensure as a supplement as needed to regain her strength?  Appended Document: Upper Endoscopy those should be ok - careful with sodium content in V8   Appended Document: Upper Endoscopy Notified patient of Dr Celesta Aver recommendations.

## 2010-05-25 NOTE — Miscellaneous (Signed)
Summary: Samples of Nexium  Clinical Lists Changes  Medications: Changed medication from [place] 40 MG CPDR (ESOMEPRAZOLE MAGNESIUM) Take 1 capsule 30 min prior to breakfast to NEXIUM 40 MG CPDR (ESOMEPRAZOLE MAGNESIUM) Take 1 capsule 30 min prior to breakfast

## 2010-06-13 LAB — SURGICAL PCR SCREEN: Staphylococcus aureus: POSITIVE — AB

## 2010-06-13 LAB — URINE MICROSCOPIC-ADD ON

## 2010-06-13 LAB — BASIC METABOLIC PANEL
BUN: 14 mg/dL (ref 6–23)
BUN: 15 mg/dL (ref 6–23)
CO2: 25 mEq/L (ref 19–32)
CO2: 26 mEq/L (ref 19–32)
Calcium: 8.8 mg/dL (ref 8.4–10.5)
Calcium: 9.4 mg/dL (ref 8.4–10.5)
Chloride: 103 mEq/L (ref 96–112)
Chloride: 107 mEq/L (ref 96–112)
Creatinine, Ser: 0.75 mg/dL (ref 0.4–1.2)
Creatinine, Ser: 0.82 mg/dL (ref 0.4–1.2)
GFR calc Af Amer: >60 mL/min (ref >60)
GFR calc Af Amer: >60 mL/min (ref >60)
GFR calc non Af Amer: >60 mL/min (ref >60)
GFR calc non Af Amer: >60 mL/min (ref >60)
Glucose, Bld: 109 mg/dL — ABNORMAL HIGH (ref 70–99)
Glucose, Bld: 89 mg/dL (ref 70–99)
Potassium: 3.9 mEq/L (ref 3.5–5.1)
Potassium: 4.6 mEq/L (ref 3.5–5.1)
Sodium: 137 mEq/L (ref 135–145)
Sodium: 139 mEq/L (ref 135–145)

## 2010-06-13 LAB — CBC
HCT: 29.7 % — ABNORMAL LOW (ref 36.0–46.0)
HCT: 39.4 % (ref 36.0–46.0)
Hemoglobin: 12.9 g/dL (ref 12.0–15.0)
Hemoglobin: 9.6 g/dL — ABNORMAL LOW (ref 12.0–15.0)
MCH: 30.5 pg (ref 26.0–34.0)
MCH: 30.9 pg (ref 26.0–34.0)
MCHC: 32.7 g/dL (ref 30.0–36.0)
MCHC: 32.7 g/dL (ref 30.0–36.0)
MCV: 93.4 fL (ref 78.0–100.0)
MCV: 94.5 fL (ref 78.0–100.0)
Platelets: 208 10*3/uL (ref 150–400)
Platelets: 238 10*3/uL (ref 150–400)
RBC: 3.18 MIL/uL — ABNORMAL LOW (ref 3.87–5.11)
RBC: 4.17 MIL/uL (ref 3.87–5.11)
RDW: 12.8 % (ref 11.5–15.5)
RDW: 12.9 % (ref 11.5–15.5)
WBC: 13.2 10*3/uL — ABNORMAL HIGH (ref 4.0–10.5)
WBC: 8.3 10*3/uL (ref 4.0–10.5)

## 2010-06-13 LAB — TYPE AND SCREEN
ABO/RH(D): A POS
Antibody Screen: NEGATIVE

## 2010-06-13 LAB — DIFFERENTIAL
Basophils Absolute: 0.1 10*3/uL (ref 0.0–0.1)
Basophils Relative: 1 % (ref 0–1)
Eosinophils Absolute: 0.2 10*3/uL (ref 0.0–0.7)
Eosinophils Relative: 2 % (ref 0–5)
Lymphocytes Relative: 32 % (ref 12–46)
Lymphs Abs: 2.7 10*3/uL (ref 0.7–4.0)
Monocytes Absolute: 0.7 10*3/uL (ref 0.1–1.0)
Monocytes Relative: 8 % (ref 3–12)
Neutro Abs: 4.8 10*3/uL (ref 1.7–7.7)
Neutrophils Relative %: 57 % (ref 43–77)

## 2010-06-13 LAB — PROTIME-INR
INR: 0.95 (ref 0.00–1.49)
Prothrombin Time: 12.9 seconds (ref 11.6–15.2)

## 2010-06-13 LAB — URINALYSIS, ROUTINE W REFLEX MICROSCOPIC
Bilirubin Urine: NEGATIVE
Glucose, UA: NEGATIVE mg/dL
Ketones, ur: NEGATIVE mg/dL
Leukocytes, UA: NEGATIVE
Nitrite: NEGATIVE
Protein, ur: NEGATIVE mg/dL
Specific Gravity, Urine: 1.018 (ref 1.005–1.030)
Urobilinogen, UA: 0.2 mg/dL (ref 0.0–1.0)
pH: 5 (ref 5.0–8.0)

## 2010-06-13 LAB — APTT: aPTT: 30 seconds (ref 24–37)

## 2010-06-16 ENCOUNTER — Encounter: Payer: Self-pay | Admitting: Internal Medicine

## 2010-06-16 ENCOUNTER — Ambulatory Visit (INDEPENDENT_AMBULATORY_CARE_PROVIDER_SITE_OTHER): Payer: Managed Care, Other (non HMO) | Admitting: Internal Medicine

## 2010-06-16 DIAGNOSIS — K5080 Crohn's disease of both small and large intestine without complications: Secondary | ICD-10-CM

## 2010-06-16 DIAGNOSIS — R112 Nausea with vomiting, unspecified: Secondary | ICD-10-CM

## 2010-06-21 NOTE — Assessment & Plan Note (Addendum)
Summary: FU Endo   History of Present Illness Visit Type: Follow-up Visit Primary GI MD: Silvano Rusk MD [facility] Primary Provider: Rowe Clack MD Requesting Provider: n/a Chief Complaint: Crohn's flare, medically induced History of Present Illness:   60 yo ww Now only on lisinopril and metoprolol  she had nausea and vomiting on Entocort and finally had to stop it. The nausea and vomting resolved within 2 days. Mild epigastric pain relieved with defecation. She is on hydrocodone and is mildly constipated.  overall her GI sxs which were severe with nausea, vomiting, abdominal pain and diarrhea are nearly gone, it seems.  She had a "nice bowel movement" after a milk-containig breakfast  supplement and ?'s if she is lactose intolerant has not noted other issues wth dairy products  She has had trouble with right knee pain (s/p arthroplasty) and has had it manipulated in late Februrary. back pain problems persist and Dr. Rolena Infante said surgery is necessary to resolve and she is not inclined to pursue that due to very long recovery and knee issues.             Clinical Reports Reviewed:  Colonoscopy:  01/25/2010:      1) 4 - 5 mm diminutive polyp at the splenic flexure - removed     2) Ulcers in the ileum - ? Crohn's - biopsied     3) Ulcers in the sigmoid colon - ? Crohn's vs. prep effect -     biopsied     4) Internal hemorrhoids     5) Otherwise normal examination     6) Personal history of T1N0 colon cancer resected 03/2009 + family     hx colon cancer sister and father  1. Ileum, biopsy,  :  -  CHRONIC ACTIVE ILEITIS WITH EROSION/ULCERATION. -  THERE IS NO EVIDENCE OF DYSPLASIA OR MALIGNANCY. -  SEE COMMENT.  2. Colon, polyp(s), splenic flexure :  - HYPERPLASTIC POLYP. - THERE IS NO EVIDENCE OF MALIGNANCY. 3. Colon, biopsy,  :  - CHRONIC MUCOSAL INJURY WITH APTHOID EROSIONS. - THERE IS NO EVIDENCE OF DYSPLASIA OR MALIGNANCY. - SEE COMMENT.  01/25/2010:   DONE  01/27/2009:      1) 15 - 18 mm sessile polyp at the hepatic flexure, will need     close follow-up to ensure complete removal if pathology benign.     Proximal and distal areas of site were marked with submucosal     SPOT.     2) 5 mm diminutive polyp in the rectum removed.     3) Internal hemorrhoids     4) Otherwise normal examination, excellent prep       1. COLON, HEPATIC FLEXURE:  ADENOCARCINOMA.  2. COLON, POLYP:  HYPERPLASTIC POLYP.  NO ADENOMATOUS CHANGE OR MALIGNANCY IDENTIFIED.  01/27/2009:  DONE  EGD  Procedure date:  05/16/2010  Findings:      Findings: Normal     Current Medications (verified): 1)  Toprol Xl 50 Mg Xr24h-Tab (Metoprolol Succinate) .... One Tablet By Mouth Once Daily 2)  Lisinopril 2.5 Mg Tabs (Lisinopril) .... Take 1 Tablet By Mouth Once A Day 3)  Hydrocodone-Acetaminophen 5-325 Mg Tabs (Hydrocodone-Acetaminophen) .... As Needed For Pain  Allergies: 1)  ! Morphine  Past History:  Past Medical History: Reviewed history from 04/29/2010 and no changes required. 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 Crohn's disease 2011  MD roster: GI - [facility] ortho spine - [facility] - [facility] - [facility]  Past Surgical History: Reviewed history from 04/29/2010 and no changes required. Caesarean sectionx3 Hysterectomy (1994) Laprotomy for small bowel obstructions. (03/05/09) right hemicolectomy T1N0 colon cancer 03/2009 Cholecystectomy (1995) Tonsillectomy (1952) Appendectomy (1964) Hand surgery (1991)  Knee surgery, bilateral (1981) Knee replacement right knee 12/11  Family History: Reviewed history from 05/13/2010 and no changes required. Family  History of Arthritis (parent, other realtive) Family History of Colon CA 1st degree relative: father and sister Family History Hypertension (parent) Kidney disease: father Throat cancer: mother Family History of Heart Disease: father  Social History: Reviewed history from 05/13/2010 and no changes required. 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   Impression & Recommendations:  Problem # 1:  ? of CROHN'S DISEASE, LARGE AND SMALL INTESTINES (ICD-555.2) Assessment Improved ilesl ulcers with chronic ileitis on pathology late last year she appears to have had nausea and vomiting with entocort though did not with first course seems asymptomatic at this time plan to observe, see me routinely in 6 months and then likely reassess with colonoscopy mR-E or CT-E if needed - likely avoid capsule endoscopy with hx of adhesions and SBO's  Problem # 2:  NAUSEA AND VOMITING (ICD-787.01) Assessment: Improved this has resolved etiology not clear at all ? Entocort vs. other medications ? if she had a partial SBO  Problem # 3:  KNEE PAIN, RIGHT (ICD-719.46) Assessment: Unchanged follwing up with Dr. Alvan Dame  Patient Instructions: 1)  Please schedule a follow-up appointment in 6 months or sooner if needed.  2)  Copy sent to : Paralee Cancel, MD 3)  The medication list was reviewed and reconciled.  All changed / newly prescribed medications were explained.  A complete  medication list was provided to the patient / caregiver.

## 2010-07-04 LAB — CBC
HCT: 27.8 % — ABNORMAL LOW (ref 36.0–46.0)
HCT: 35.3 % — ABNORMAL LOW (ref 36.0–46.0)
Hemoglobin: 11.5 g/dL — ABNORMAL LOW (ref 12.0–15.0)
Hemoglobin: 9.2 g/dL — ABNORMAL LOW (ref 12.0–15.0)
MCHC: 32.5 g/dL (ref 30.0–36.0)
MCHC: 33 g/dL (ref 30.0–36.0)
MCV: 91.4 fL (ref 78.0–100.0)
MCV: 91.5 fL (ref 78.0–100.0)
Platelets: 301 10*3/uL (ref 150–400)
Platelets: 520 10*3/uL — ABNORMAL HIGH (ref 150–400)
RBC: 3.04 MIL/uL — ABNORMAL LOW (ref 3.87–5.11)
RBC: 3.86 MIL/uL — ABNORMAL LOW (ref 3.87–5.11)
RDW: 12.6 % (ref 11.5–15.5)
RDW: 12.9 % (ref 11.5–15.5)
WBC: 5.3 10*3/uL (ref 4.0–10.5)
WBC: 7.9 10*3/uL (ref 4.0–10.5)

## 2010-07-04 LAB — COMPREHENSIVE METABOLIC PANEL
ALT: 86 U/L — ABNORMAL HIGH (ref 0–35)
AST: 21 U/L (ref 0–37)
Albumin: 3.5 g/dL (ref 3.5–5.2)
Alkaline Phosphatase: 103 U/L (ref 39–117)
BUN: 3 mg/dL — ABNORMAL LOW (ref 6–23)
CO2: 28 mEq/L (ref 19–32)
Calcium: 9 mg/dL (ref 8.4–10.5)
Chloride: 101 mEq/L (ref 96–112)
Creatinine, Ser: 0.92 mg/dL (ref 0.4–1.2)
GFR calc Af Amer: >60 mL/min (ref >60)
GFR calc non Af Amer: >60 mL/min (ref >60)
Glucose, Bld: 106 mg/dL — ABNORMAL HIGH (ref 70–99)
Potassium: 3.1 mEq/L — ABNORMAL LOW (ref 3.5–5.1)
Sodium: 139 mEq/L (ref 135–145)
Total Bilirubin: <0.1 mg/dL — ABNORMAL LOW (ref 0.3–1.2)
Total Protein: 6.6 g/dL (ref 6.0–8.3)

## 2010-07-05 LAB — CBC
HCT: 29.5 % — ABNORMAL LOW (ref 36.0–46.0)
HCT: 30.6 % — ABNORMAL LOW (ref 36.0–46.0)
HCT: 33.5 % — ABNORMAL LOW (ref 36.0–46.0)
HCT: 35.2 % — ABNORMAL LOW (ref 36.0–46.0)
HCT: 36.7 % (ref 36.0–46.0)
Hemoglobin: 10.2 g/dL — ABNORMAL LOW (ref 12.0–15.0)
Hemoglobin: 11.3 g/dL — ABNORMAL LOW (ref 12.0–15.0)
Hemoglobin: 11.8 g/dL — ABNORMAL LOW (ref 12.0–15.0)
Hemoglobin: 12.2 g/dL (ref 12.0–15.0)
Hemoglobin: 9.9 g/dL — ABNORMAL LOW (ref 12.0–15.0)
MCHC: 33.3 g/dL (ref 30.0–36.0)
MCHC: 33.4 g/dL (ref 30.0–36.0)
MCHC: 33.5 g/dL (ref 30.0–36.0)
MCHC: 33.5 g/dL (ref 30.0–36.0)
MCHC: 33.7 g/dL (ref 30.0–36.0)
MCV: 91.4 fL (ref 78.0–100.0)
MCV: 91.8 fL (ref 78.0–100.0)
MCV: 92.3 fL (ref 78.0–100.0)
MCV: 92.4 fL (ref 78.0–100.0)
MCV: 92.8 fL (ref 78.0–100.0)
Platelets: 245 10*3/uL (ref 150–400)
Platelets: 252 10*3/uL (ref 150–400)
Platelets: 271 10*3/uL (ref 150–400)
Platelets: 323 10*3/uL (ref 150–400)
Platelets: 358 10*3/uL (ref 150–400)
RBC: 3.18 MIL/uL — ABNORMAL LOW (ref 3.87–5.11)
RBC: 3.31 MIL/uL — ABNORMAL LOW (ref 3.87–5.11)
RBC: 3.67 MIL/uL — ABNORMAL LOW (ref 3.87–5.11)
RBC: 3.84 MIL/uL — ABNORMAL LOW (ref 3.87–5.11)
RBC: 3.98 MIL/uL (ref 3.87–5.11)
RDW: 12.5 % (ref 11.5–15.5)
RDW: 12.9 % (ref 11.5–15.5)
RDW: 13 % (ref 11.5–15.5)
RDW: 13.1 % (ref 11.5–15.5)
RDW: 13.2 % (ref 11.5–15.5)
WBC: 10.7 10*3/uL — ABNORMAL HIGH (ref 4.0–10.5)
WBC: 5.6 10*3/uL (ref 4.0–10.5)
WBC: 6.1 10*3/uL (ref 4.0–10.5)
WBC: 6.1 10*3/uL (ref 4.0–10.5)
WBC: 7.9 10*3/uL (ref 4.0–10.5)

## 2010-07-05 LAB — URINALYSIS, ROUTINE W REFLEX MICROSCOPIC
Bilirubin Urine: NEGATIVE
Glucose, UA: NEGATIVE mg/dL
Ketones, ur: NEGATIVE mg/dL
Leukocytes, UA: NEGATIVE
Nitrite: NEGATIVE
Protein, ur: NEGATIVE mg/dL
Specific Gravity, Urine: 1.012 (ref 1.005–1.030)
Urobilinogen, UA: 0.2 mg/dL (ref 0.0–1.0)
pH: 6.5 (ref 5.0–8.0)

## 2010-07-05 LAB — BASIC METABOLIC PANEL
BUN: 3 mg/dL — ABNORMAL LOW (ref 6–23)
BUN: 6 mg/dL (ref 6–23)
BUN: 9 mg/dL (ref 6–23)
CO2: 27 mEq/L (ref 19–32)
CO2: 30 mEq/L (ref 19–32)
CO2: 31 mEq/L (ref 19–32)
Calcium: 8.3 mg/dL — ABNORMAL LOW (ref 8.4–10.5)
Calcium: 8.4 mg/dL (ref 8.4–10.5)
Calcium: 8.9 mg/dL (ref 8.4–10.5)
Chloride: 107 mEq/L (ref 96–112)
Chloride: 107 mEq/L (ref 96–112)
Chloride: 109 mEq/L (ref 96–112)
Creatinine, Ser: 0.72 mg/dL (ref 0.4–1.2)
Creatinine, Ser: 0.77 mg/dL (ref 0.4–1.2)
Creatinine, Ser: 0.88 mg/dL (ref 0.4–1.2)
GFR calc Af Amer: >60 mL/min (ref >60)
GFR calc Af Amer: >60 mL/min (ref >60)
GFR calc Af Amer: >60 mL/min (ref >60)
GFR calc non Af Amer: >60 mL/min (ref >60)
GFR calc non Af Amer: >60 mL/min (ref >60)
GFR calc non Af Amer: >60 mL/min (ref >60)
Glucose, Bld: 112 mg/dL — ABNORMAL HIGH (ref 70–99)
Glucose, Bld: 117 mg/dL — ABNORMAL HIGH (ref 70–99)
Glucose, Bld: 120 mg/dL — ABNORMAL HIGH (ref 70–99)
Potassium: 3.4 mEq/L — ABNORMAL LOW (ref 3.5–5.1)
Potassium: 3.7 mEq/L (ref 3.5–5.1)
Potassium: 4.2 mEq/L (ref 3.5–5.1)
Sodium: 138 mEq/L (ref 135–145)
Sodium: 143 mEq/L (ref 135–145)
Sodium: 145 mEq/L (ref 135–145)

## 2010-07-05 LAB — COMPREHENSIVE METABOLIC PANEL
ALT: 16 U/L (ref 0–35)
ALT: 64 U/L — ABNORMAL HIGH (ref 0–35)
AST: 18 U/L (ref 0–37)
AST: 47 U/L — ABNORMAL HIGH (ref 0–37)
Albumin: 3.2 g/dL — ABNORMAL LOW (ref 3.5–5.2)
Albumin: 3.4 g/dL — ABNORMAL LOW (ref 3.5–5.2)
Alkaline Phosphatase: 59 U/L (ref 39–117)
Alkaline Phosphatase: 91 U/L (ref 39–117)
BUN: 4 mg/dL — ABNORMAL LOW (ref 6–23)
BUN: 5 mg/dL — ABNORMAL LOW (ref 6–23)
CO2: 26 mEq/L (ref 19–32)
CO2: 28 mEq/L (ref 19–32)
Calcium: 8.9 mg/dL (ref 8.4–10.5)
Calcium: 9.7 mg/dL (ref 8.4–10.5)
Chloride: 102 mEq/L (ref 96–112)
Chloride: 107 mEq/L (ref 96–112)
Creatinine, Ser: 0.71 mg/dL (ref 0.4–1.2)
Creatinine, Ser: 0.73 mg/dL (ref 0.4–1.2)
GFR calc Af Amer: >60 mL/min (ref >60)
GFR calc Af Amer: >60 mL/min (ref >60)
GFR calc non Af Amer: >60 mL/min (ref >60)
GFR calc non Af Amer: >60 mL/min (ref >60)
Glucose, Bld: 107 mg/dL — ABNORMAL HIGH (ref 70–99)
Glucose, Bld: 112 mg/dL — ABNORMAL HIGH (ref 70–99)
Potassium: 3.4 mEq/L — ABNORMAL LOW (ref 3.5–5.1)
Potassium: 3.5 mEq/L (ref 3.5–5.1)
Sodium: 140 mEq/L (ref 135–145)
Sodium: 141 mEq/L (ref 135–145)
Total Bilirubin: 0.3 mg/dL (ref 0.3–1.2)
Total Bilirubin: 0.4 mg/dL (ref 0.3–1.2)
Total Protein: 5.9 g/dL — ABNORMAL LOW (ref 6.0–8.3)
Total Protein: 6.7 g/dL (ref 6.0–8.3)

## 2010-07-05 LAB — URINE MICROSCOPIC-ADD ON

## 2010-07-05 LAB — DIFFERENTIAL
Basophils Absolute: 0 10*3/uL (ref 0.0–0.1)
Basophils Absolute: 0.1 10*3/uL (ref 0.0–0.1)
Basophils Relative: 1 % (ref 0–1)
Basophils Relative: 1 % (ref 0–1)
Eosinophils Absolute: 0.1 10*3/uL (ref 0.0–0.7)
Eosinophils Absolute: 0.1 10*3/uL (ref 0.0–0.7)
Eosinophils Relative: 1 % (ref 0–5)
Eosinophils Relative: 2 % (ref 0–5)
Lymphocytes Relative: 20 % (ref 12–46)
Lymphocytes Relative: 26 % (ref 12–46)
Lymphs Abs: 1.6 10*3/uL (ref 0.7–4.0)
Lymphs Abs: 1.6 10*3/uL (ref 0.7–4.0)
Monocytes Absolute: 0.7 10*3/uL (ref 0.1–1.0)
Monocytes Absolute: 0.7 10*3/uL (ref 0.1–1.0)
Monocytes Relative: 11 % (ref 3–12)
Monocytes Relative: 8 % (ref 3–12)
Neutro Abs: 3.7 10*3/uL (ref 1.7–7.7)
Neutro Abs: 5.5 10*3/uL (ref 1.7–7.7)
Neutrophils Relative %: 61 % (ref 43–77)
Neutrophils Relative %: 69 % (ref 43–77)

## 2010-07-05 LAB — LIPASE, BLOOD: Lipase: 13 U/L (ref 11–59)

## 2010-07-05 LAB — TYPE AND SCREEN
ABO/RH(D): A POS
Antibody Screen: NEGATIVE

## 2010-07-05 LAB — ABO/RH: ABO/RH(D): A POS

## 2010-07-06 LAB — URINALYSIS, ROUTINE W REFLEX MICROSCOPIC
Bilirubin Urine: NEGATIVE
Glucose, UA: NEGATIVE mg/dL
Ketones, ur: NEGATIVE mg/dL
Leukocytes, UA: NEGATIVE
Nitrite: NEGATIVE
Protein, ur: NEGATIVE mg/dL
Specific Gravity, Urine: 1.019 (ref 1.005–1.030)
Urobilinogen, UA: 0.2 mg/dL (ref 0.0–1.0)
pH: 5 (ref 5.0–8.0)

## 2010-07-06 LAB — CARDIAC PANEL(CRET KIN+CKTOT+MB+TROPI)
CK, MB: 2.3 ng/mL (ref 0.3–4.0)
CK, MB: 2.6 ng/mL (ref 0.3–4.0)
Relative Index: 1.8 (ref 0.0–2.5)
Relative Index: INVALID (ref 0.0–2.5)
Total CK: 127 U/L (ref 7–177)
Total CK: 96 U/L (ref 7–177)
Troponin I: 0.05 ng/mL (ref 0.00–0.06)
Troponin I: 0.05 ng/mL (ref 0.00–0.06)

## 2010-07-06 LAB — POCT CARDIAC MARKERS
CKMB, poc: 2.2 ng/mL (ref 1.0–8.0)
Myoglobin, poc: 63.7 ng/mL (ref 12–200)
Troponin i, poc: <0.05 ng/mL (ref 0.00–0.09)

## 2010-07-06 LAB — COMPREHENSIVE METABOLIC PANEL
ALT: 15 U/L (ref 0–35)
ALT: 12 U/L (ref 0–35)
AST: 17 U/L (ref 0–37)
AST: 15 U/L (ref 0–37)
Albumin: 3.8 g/dL (ref 3.5–5.2)
Albumin: 3.8 g/dL (ref 3.5–5.2)
Alkaline Phosphatase: 66 U/L (ref 39–117)
Alkaline Phosphatase: 64 U/L (ref 39–117)
BUN: 14 mg/dL (ref 6–23)
BUN: 9 mg/dL (ref 6–23)
CO2: 28 mEq/L (ref 19–32)
CO2: 26 mEq/L (ref 19–32)
Calcium: 9.6 mg/dL (ref 8.4–10.5)
Calcium: 9.4 mg/dL (ref 8.4–10.5)
Chloride: 107 mEq/L (ref 96–112)
Chloride: 104 mEq/L (ref 96–112)
Creatinine, Ser: 0.8 mg/dL (ref 0.4–1.2)
Creatinine, Ser: 0.77 mg/dL (ref 0.4–1.2)
GFR calc Af Amer: >60 mL/min (ref >60)
GFR calc Af Amer: >60 mL/min (ref >60)
GFR calc non Af Amer: >60 mL/min (ref >60)
GFR calc non Af Amer: >60 mL/min (ref >60)
Glucose, Bld: 94 mg/dL (ref 70–99)
Glucose, Bld: 93 mg/dL (ref 70–99)
Potassium: 4.1 mEq/L (ref 3.5–5.1)
Potassium: 4.2 mEq/L (ref 3.5–5.1)
Sodium: 141 mEq/L (ref 135–145)
Sodium: 137 mEq/L (ref 135–145)
Total Bilirubin: 0.5 mg/dL (ref 0.3–1.2)
Total Bilirubin: 0.6 mg/dL (ref 0.3–1.2)
Total Protein: 6.8 g/dL (ref 6.0–8.3)
Total Protein: 6.4 g/dL (ref 6.0–8.3)

## 2010-07-06 LAB — LIPID PANEL
Cholesterol: 182 mg/dL (ref 0–200)
HDL: 40 mg/dL (ref >39)
LDL Cholesterol: 109 mg/dL — ABNORMAL HIGH (ref 0–99)
Total CHOL/HDL Ratio: 4.6 RATIO
Triglycerides: 164 mg/dL — ABNORMAL HIGH (ref <150)
VLDL: 33 mg/dL (ref 0–40)

## 2010-07-06 LAB — TSH: TSH: 0.75 u[IU]/mL (ref 0.350–4.500)

## 2010-07-06 LAB — CK TOTAL AND CKMB (NOT AT ARMC)
CK, MB: 3.3 ng/mL (ref 0.3–4.0)
Relative Index: 2.6 — ABNORMAL HIGH (ref 0.0–2.5)
Total CK: 125 U/L (ref 7–177)

## 2010-07-06 LAB — DIFFERENTIAL
Basophils Absolute: 0 10*3/uL (ref 0.0–0.1)
Basophils Absolute: 0.1 10*3/uL (ref 0.0–0.1)
Basophils Relative: 1 % (ref 0–1)
Basophils Relative: 1 % (ref 0–1)
Eosinophils Absolute: 0.1 10*3/uL (ref 0.0–0.7)
Eosinophils Absolute: 0.2 10*3/uL (ref 0.0–0.7)
Eosinophils Relative: 2 % (ref 0–5)
Eosinophils Relative: 3 % (ref 0–5)
Lymphocytes Relative: 36 % (ref 12–46)
Lymphocytes Relative: 37 % (ref 12–46)
Lymphs Abs: 2.6 10*3/uL (ref 0.7–4.0)
Lymphs Abs: 3.2 10*3/uL (ref 0.7–4.0)
Monocytes Absolute: 0.6 10*3/uL (ref 0.1–1.0)
Monocytes Absolute: 0.7 10*3/uL (ref 0.1–1.0)
Monocytes Relative: 8 % (ref 3–12)
Monocytes Relative: 8 % (ref 3–12)
Neutro Abs: 3.9 10*3/uL (ref 1.7–7.7)
Neutro Abs: 4.5 10*3/uL (ref 1.7–7.7)
Neutrophils Relative %: 54 % (ref 43–77)
Neutrophils Relative %: 52 % (ref 43–77)

## 2010-07-06 LAB — CBC
HCT: 38 % (ref 36.0–46.0)
HCT: 34 % — ABNORMAL LOW (ref 36.0–46.0)
HCT: 37.6 % (ref 36.0–46.0)
Hemoglobin: 12.6 g/dL (ref 12.0–15.0)
Hemoglobin: 11.8 g/dL — ABNORMAL LOW (ref 12.0–15.0)
Hemoglobin: 13.1 g/dL (ref 12.0–15.0)
MCHC: 33.3 g/dL (ref 30.0–36.0)
MCHC: 34.6 g/dL (ref 30.0–36.0)
MCHC: 34.7 g/dL (ref 30.0–36.0)
MCV: 92.3 fL (ref 78.0–100.0)
MCV: 92.3 fL (ref 78.0–100.0)
MCV: 93 fL (ref 78.0–100.0)
Platelets: 217 10*3/uL (ref 150–400)
Platelets: 176 10*3/uL (ref 150–400)
Platelets: 227 10*3/uL (ref 150–400)
RBC: 4.12 MIL/uL (ref 3.87–5.11)
RBC: 3.66 MIL/uL — ABNORMAL LOW (ref 3.87–5.11)
RBC: 4.07 MIL/uL (ref 3.87–5.11)
RDW: 12.8 % (ref 11.5–15.5)
RDW: 12.9 % (ref 11.5–15.5)
RDW: 13.1 % (ref 11.5–15.5)
WBC: 7.3 10*3/uL (ref 4.0–10.5)
WBC: 6.6 10*3/uL (ref 4.0–10.5)
WBC: 8.7 10*3/uL (ref 4.0–10.5)

## 2010-07-06 LAB — GLUCOSE, CAPILLARY
Glucose-Capillary: 112 mg/dL — ABNORMAL HIGH (ref 70–99)
Glucose-Capillary: 191 mg/dL — ABNORMAL HIGH (ref 70–99)
Glucose-Capillary: 81 mg/dL (ref 70–99)
Glucose-Capillary: 86 mg/dL (ref 70–99)
Glucose-Capillary: 90 mg/dL (ref 70–99)
Glucose-Capillary: 98 mg/dL (ref 70–99)

## 2010-07-06 LAB — PROTIME-INR
INR: 0.97 (ref 0.00–1.49)
Prothrombin Time: 12.8 seconds (ref 11.6–15.2)

## 2010-07-06 LAB — BASIC METABOLIC PANEL
BUN: 14 mg/dL (ref 6–23)
CO2: 27 mEq/L (ref 19–32)
Calcium: 8.5 mg/dL (ref 8.4–10.5)
Chloride: 107 mEq/L (ref 96–112)
Creatinine, Ser: 0.92 mg/dL (ref 0.4–1.2)
GFR calc Af Amer: >60 mL/min (ref >60)
GFR calc non Af Amer: >60 mL/min (ref >60)
Glucose, Bld: 102 mg/dL — ABNORMAL HIGH (ref 70–99)
Potassium: 4.1 mEq/L (ref 3.5–5.1)
Sodium: 137 mEq/L (ref 135–145)

## 2010-07-06 LAB — URINE MICROSCOPIC-ADD ON

## 2010-07-06 LAB — POCT I-STAT, CHEM 8
BUN: 13 mg/dL (ref 6–23)
Calcium, Ion: 1.25 mmol/L (ref 1.12–1.32)
Chloride: 105 mEq/L (ref 96–112)
Creatinine, Ser: 0.8 mg/dL (ref 0.4–1.2)
Glucose, Bld: 89 mg/dL (ref 70–99)
HCT: 40 % (ref 36.0–46.0)
Hemoglobin: 13.6 g/dL (ref 12.0–15.0)
Potassium: 4.3 mEq/L (ref 3.5–5.1)
Sodium: 140 mEq/L (ref 135–145)
TCO2: 27 mmol/L (ref 0–100)

## 2010-07-06 LAB — TROPONIN I: Troponin I: 0.11 ng/mL — ABNORMAL HIGH (ref 0.00–0.06)

## 2010-07-06 LAB — HEMOGLOBIN A1C
Hgb A1c MFr Bld: 5.4 % (ref 4.6–6.1)
Mean Plasma Glucose: 108 mg/dL

## 2010-07-06 LAB — MAGNESIUM: Magnesium: 1.9 mg/dL (ref 1.5–2.5)

## 2010-07-06 LAB — PHOSPHORUS: Phosphorus: 3.4 mg/dL (ref 2.3–4.6)

## 2010-07-18 LAB — DIFFERENTIAL
Basophils Absolute: 0.1 10*3/uL (ref 0.0–0.1)
Basophils Relative: 1 % (ref 0–1)
Eosinophils Absolute: 0.2 10*3/uL (ref 0.0–0.7)
Eosinophils Relative: 2 % (ref 0–5)
Lymphocytes Relative: 24 % (ref 12–46)
Lymphs Abs: 2.7 10*3/uL (ref 0.7–4.0)
Monocytes Absolute: 0.8 10*3/uL (ref 0.1–1.0)
Monocytes Relative: 7 % (ref 3–12)
Neutro Abs: 7.7 10*3/uL (ref 1.7–7.7)
Neutrophils Relative %: 67 % (ref 43–77)

## 2010-07-18 LAB — BASIC METABOLIC PANEL
BUN: 4 mg/dL — ABNORMAL LOW (ref 6–23)
BUN: 7 mg/dL (ref 6–23)
BUN: 9 mg/dL (ref 6–23)
CO2: 28 mEq/L (ref 19–32)
CO2: 28 mEq/L (ref 19–32)
CO2: 29 mEq/L (ref 19–32)
Calcium: 8.3 mg/dL — ABNORMAL LOW (ref 8.4–10.5)
Calcium: 8.9 mg/dL (ref 8.4–10.5)
Calcium: 9.3 mg/dL (ref 8.4–10.5)
Chloride: 109 mEq/L (ref 96–112)
Chloride: 110 mEq/L (ref 96–112)
Chloride: 112 mEq/L (ref 96–112)
Creatinine, Ser: 0.77 mg/dL (ref 0.4–1.2)
Creatinine, Ser: 0.87 mg/dL (ref 0.4–1.2)
Creatinine, Ser: 0.95 mg/dL (ref 0.4–1.2)
GFR calc Af Amer: >60 mL/min (ref >60)
GFR calc Af Amer: >60 mL/min (ref >60)
GFR calc Af Amer: >60 mL/min (ref >60)
GFR calc non Af Amer: >60 mL/min (ref >60)
GFR calc non Af Amer: >60 mL/min (ref >60)
GFR calc non Af Amer: >60 mL/min (ref >60)
Glucose, Bld: 103 mg/dL — ABNORMAL HIGH (ref 70–99)
Glucose, Bld: 128 mg/dL — ABNORMAL HIGH (ref 70–99)
Glucose, Bld: 141 mg/dL — ABNORMAL HIGH (ref 70–99)
Potassium: 4 mEq/L (ref 3.5–5.1)
Potassium: 4.2 mEq/L (ref 3.5–5.1)
Potassium: 5.4 mEq/L — ABNORMAL HIGH (ref 3.5–5.1)
Sodium: 141 mEq/L (ref 135–145)
Sodium: 141 mEq/L (ref 135–145)
Sodium: 145 mEq/L (ref 135–145)

## 2010-07-18 LAB — CBC
HCT: 32 % — ABNORMAL LOW (ref 36.0–46.0)
HCT: 38.9 % (ref 36.0–46.0)
HCT: 40.6 % (ref 36.0–46.0)
HCT: 43.2 % (ref 36.0–46.0)
Hemoglobin: 10.8 g/dL — ABNORMAL LOW (ref 12.0–15.0)
Hemoglobin: 13.1 g/dL (ref 12.0–15.0)
Hemoglobin: 13.5 g/dL (ref 12.0–15.0)
Hemoglobin: 14.4 g/dL (ref 12.0–15.0)
MCHC: 33.2 g/dL (ref 30.0–36.0)
MCHC: 33.4 g/dL (ref 30.0–36.0)
MCHC: 33.4 g/dL (ref 30.0–36.0)
MCHC: 33.6 g/dL (ref 30.0–36.0)
MCV: 90.7 fL (ref 78.0–100.0)
MCV: 90.9 fL (ref 78.0–100.0)
MCV: 91.4 fL (ref 78.0–100.0)
MCV: 91.5 fL (ref 78.0–100.0)
Platelets: 196 10*3/uL (ref 150–400)
Platelets: 241 10*3/uL (ref 150–400)
Platelets: 244 10*3/uL (ref 150–400)
Platelets: 253 10*3/uL (ref 150–400)
RBC: 3.5 MIL/uL — ABNORMAL LOW (ref 3.87–5.11)
RBC: 4.29 MIL/uL (ref 3.87–5.11)
RBC: 4.45 MIL/uL (ref 3.87–5.11)
RBC: 4.75 MIL/uL (ref 3.87–5.11)
RDW: 12.8 % (ref 11.5–15.5)
RDW: 13 % (ref 11.5–15.5)
RDW: 13 % (ref 11.5–15.5)
RDW: 13.2 % (ref 11.5–15.5)
WBC: 11 10*3/uL — ABNORMAL HIGH (ref 4.0–10.5)
WBC: 11.6 10*3/uL — ABNORMAL HIGH (ref 4.0–10.5)
WBC: 12.7 10*3/uL — ABNORMAL HIGH (ref 4.0–10.5)
WBC: 7.9 10*3/uL (ref 4.0–10.5)

## 2010-07-18 LAB — URINALYSIS, ROUTINE W REFLEX MICROSCOPIC
Bilirubin Urine: NEGATIVE
Glucose, UA: NEGATIVE mg/dL
Ketones, ur: NEGATIVE mg/dL
Leukocytes, UA: NEGATIVE
Nitrite: NEGATIVE
Protein, ur: NEGATIVE mg/dL
Specific Gravity, Urine: 1.012 (ref 1.005–1.030)
Urobilinogen, UA: 0.2 mg/dL (ref 0.0–1.0)
pH: 5.5 (ref 5.0–8.0)

## 2010-07-18 LAB — URINE MICROSCOPIC-ADD ON

## 2010-07-18 LAB — COMPREHENSIVE METABOLIC PANEL
ALT: 15 U/L (ref 0–35)
AST: 20 U/L (ref 0–37)
Albumin: 4 g/dL (ref 3.5–5.2)
Alkaline Phosphatase: 73 U/L (ref 39–117)
BUN: 14 mg/dL (ref 6–23)
CO2: 23 mEq/L (ref 19–32)
Calcium: 9.8 mg/dL (ref 8.4–10.5)
Chloride: 105 mEq/L (ref 96–112)
Creatinine, Ser: 0.8 mg/dL (ref 0.4–1.2)
GFR calc Af Amer: >60 mL/min (ref >60)
GFR calc non Af Amer: >60 mL/min (ref >60)
Glucose, Bld: 126 mg/dL — ABNORMAL HIGH (ref 70–99)
Potassium: 4.1 mEq/L (ref 3.5–5.1)
Sodium: 137 mEq/L (ref 135–145)
Total Bilirubin: 0.6 mg/dL (ref 0.3–1.2)
Total Protein: 6.8 g/dL (ref 6.0–8.3)

## 2010-07-18 LAB — CLOSTRIDIUM DIFFICILE EIA
C difficile Toxins A+B, EIA: NEGATIVE
C difficile Toxins A+B, EIA: NEGATIVE

## 2010-07-18 LAB — LIPASE, BLOOD: Lipase: 24 U/L (ref 11–59)

## 2010-08-16 NOTE — Op Note (Signed)
NAMEBERNESE, DOFFING            ACCOUNT NO.:  [id number]   MEDICAL RECORD NO.:  [id number]          PATIENT TYPE:  INP   LOCATION:  1523                         FACILITY:  [facility]   PHYSICIAN:  [name] T. Hoxworth, M.D.DATE OF BIRTH:  08/26/1950   DATE OF PROCEDURE:  04/24/2008  DATE OF DISCHARGE:                               OPERATIVE REPORT   PREOPERATIVE DIAGNOSIS:  Small bowel obstruction.   POSTOPERATIVE DIAGNOSIS:  Small bowel obstruction.   SURGICAL PROCEDURES:  Laparotomy and lysis of adhesions for small bowel  obstruction.   SURGEON:  [name]. Hoxworth, MD   ASSISTANT:  Isabel Caprice. Hassell Done, MD   ANESTHESIA:  General.   BRIEF HISTORY:  Ms. Truett is a 60 year old female with a previous  surgical history of three C-sections, hysterectomy and laparotomy for  ectopic pregnancy all through low midline incision.  For the past  several years she has had more frequent intermittent episodes of small  bowel obstruction requiring hospitalization but not surgery.  She  presented 3 days ago with again crampy abdominal pain, nausea, vomiting  and CT scan evidence of distal small bowel obstruction.  This failed to  improve over about a 48 hour observation and I have recommended  proceeding with exploratory laparotomy.  The indications for the  procedure, its nature, risks of bleeding, infection, bowel injury,  anesthetic risks, recurrent obstruction were discussed and understood.  She is now brought to the operating room for this procedure.   DESCRIPTION OF PROCEDURE:  The patient was brought to the operating room  and placed in supine position on the operating table and general  endotracheal anesthesia was induced.  Foley catheter was placed.  Nasogastric tube was already in place.  The abdomen was widely sterilely  prepped and draped.  She received preoperative IV antibiotics.  Correct  patient and procedure were verified.  The previous low midline incision  was used and  carried up just above the umbilicus and dissection carried  down through subcutaneous tissues to the midline fascia.  The fascia was  very fibrotic with old Tycron sutures and dissection was carried down  carefully through the fascia.  At the top end of the incision I was able  to enter the peritoneum.  The incision was opened down more distally and  I found extremely dense adherent loop of small bowel up to the incision.  With very tedious dissection over about 45 minutes I carefully opened  the incision, gradually inferiorly and dissected the small bowel off the  anterior abdominal wall.  As we got lower in the incision the bowel  actually was up adherent to the rectus muscle well anterior to the  peritoneum as if it had essentially chronically herniated up through the  posterior sheath on to the back edge of the anterior fascia.  Also there  was evidence of old suture material in the bowel of possible previous  repair.  These adhesions were very dense and tedious to take down but we  were able to finally completely free the small bowel off the anterior  abdominal wall.  There was no full thickness enterotomy  but a couple  areas of some serosal muscular thinning and these were closed  transversely with interrupted silks.  There were some more filmy  adhesions between loops of small bowel and distally down into the pelvis  that were completely freed down to the ileocecal valve.  The bowel was  then traced back proximally and was completely decompressed back to the  area that had been involved of the abdominal wall adhesions and the  bowel proximal to this was significantly dilated and this clearly was  the point of obstruction.   Again, this bowel was carefully inspected and there was no significant  injury to it and it was all widely patent and we did not feel there was  any need for resection.  The proximal small bowel was dilated, otherwise  normal and the remainder of the exploration  was unremarkable.  Following  this the abdomen was copiously irrigated with warm saline.  I placed a  piece of Seprafilm over the omentum which was laid over the bowel and  then the midline fascia was closed with running #1 PDS looped beginning  at either end and tied centrally and careful attention was paid to  closing the posterior sheath as well.  The subcu was irrigated and the  skin closed with staples.  Sponge and needle counts were correct.  The  patient was taken to recovery in good condition.      [name]. Hoxworth, M.D.  Electronically Signed     BTH/MEDQ  D:  04/24/2008  T:  04/26/2008  Job:  [id number]

## 2010-08-16 NOTE — H&P (Signed)
Erica, [name]            ACCOUNT NO.:  [id number]   MEDICAL RECORD NO.:  [id number]          PATIENT TYPE:  INP   LOCATION:  1523                         FACILITY:  [facility]   PHYSICIAN:  [name] T. Hoxworth, M.D.DATE OF BIRTH:  1950/12/24   DATE OF ADMISSION:  04/22/2008  DATE OF DISCHARGE:                              HISTORY & PHYSICAL   CHIEF COMPLAINT:  Abdominal pain.   HISTORY OF PRESENT ILLNESS:  Erica [name] is a 60 year old female who  presents to the emergency room with a 12-hour history of crampy low  abdominal pain and nausea.  She vomited once at home.  The patient  states she has a history of several hospitalizations for small-bowel  obstruction in the last few years that were treated without surgery.  These symptoms are very similar to what she has experienced in the past.  She feels like she had some mild abdominal distention.  She had a bowel  movement yesterday but none since the onset of her symptoms.  No fever  or chills.   PAST MEDICAL HISTORY:  Previous surgery includes 3 C-sections,  laparoscopic cholecystectomy, hysterectomy and laparotomy for ectopic  pregnancy.   PAST MEDICAL HISTORY:  Denies any serious illness or hospitalizations.   MEDICATIONS:  None.   ALLERGIES:  None.   SOCIAL HISTORY:  She is married.  Does not smoke cigarettes or drink  alcohol.   FAMILY HISTORY:  Significant for colon cancer in her father and sister.  Colonoscopy up to date.   REVIEW OF SYSTEMS:  GENERAL:  No fever, chills, weight change.  HEENT:  No vision, hearing, swallowing problems.  RESPIRATORY:  No shortness of  breath, cough, wheezing.  CARDIAC:  No chest pain, palpitations, history  of heart disease.  ABDOMEN:  GI as above.  GU: No urinary burning or  frequency.  MUSCULOSKELETAL:  No joint swelling or pain.  HEMATOLOGIC:  No history of blood clots or abnormal bleeding.   PHYSICAL EXAMINATION:  VITAL SIGNS:  Temperature is 97.5, pulse 65,  respirations  20, blood pressure 137/90.  GENERAL:  A well-developed female in no acute distress.  SKIN:  Warm and dry.  No rash or infection.  HEENT/NECK:  No palpable masses or thyromegaly.  Oropharynx clear.  Sclerae anicteric.  LYMPH NODES:  No cervical, supraclavicular or inguinal nodes palpable.  LUNGS:  Clear without wheezing or increased work of breathing.  CARDIAC:  Regular rate and rhythm.  No murmurs.  No edema.  No JVD.  Peripheral pulses intact.  ABDOMEN:  Well-healed low midline incision.  Minimal distention.  Bowel  sounds are present.  Abdomen is soft with mild lower abdominal  tenderness.  No guarding.  No discernible masses or organomegaly.  EXTREMITIES:  No joint swelling or deformity.  NEUROLOGIC:  Alert, oriented.  Motor and sensory exams grossly normal.   LABORATORY:  White count 11.6, hemoglobin 13.  Electrolytes and LFTs  within normal limits.  Flat and upright x-rays of the abdomen show 1 or  2 slightly dilated loops of small bowel consistent with early small-  bowel obstruction.  CT scan of the abdomen and  pelvis was obtained which  shows more definite small-bowel obstruction with dilated pelvic bowel  loops of to 3.5 cm and an apparent transition point in the pelvis.   ASSESSMENT/PLAN:  Recurrent small-bowel obstruction probably secondary  to pelvic adhesions from previous gynecologic surgery.  The patient is  being admitted.  We will treat with IV fluids, NG suction, pain and  nausea medication and close follow-up with repeat x-rays in the morning.      [name]. Hoxworth, M.D.  Electronically Signed     BTH/MEDQ  D:  04/22/2008  T:  04/22/2008  Job:  [id number]

## 2010-08-19 NOTE — Discharge Summary (Signed)
NAMELUTHER, Erica [name]            ACCOUNT NO.:  [id number]   MEDICAL RECORD NO.:  [id number]          PATIENT TYPE:  INP   LOCATION:  1523                         FACILITY:  [facility]   PHYSICIAN:  [name] T. Hoxworth, M.D.DATE OF BIRTH:  January 06, 1951   DATE OF ADMISSION:  04/22/2008  DATE OF DISCHARGE:  04/29/2008                               DISCHARGE SUMMARY   DISCHARGE DIAGNOSIS:  Small bowel obstruction.   OPERATIONS:  Laparotomy and lysis of adhesions for small bowel  obstruction, April 24, 2008.   HISTORY OF PRESENT ILLNESS:  This patient is a 60 year old female who  presents with 12 hours of cramping, low abdominal pain and nausea and  one episode of vomiting.  She has a previous history of several episodes  of small bowel obstruction in the past year, two treated without  surgery.  Had normal bowel movement yesterday.   PAST MEDICAL HISTORY:   PREVIOUS SURGERY:  Includes:  1. C-section x3.  2. Laparoscopic cholecystectomy.  3. Ruptured ectopic pregnancy.  4. Hysterectomy.   No significant medical problems.   MEDICATIONS:  None.   SOCIAL HISTORY:  See detailed H and P.   FAMILY HISTORY:  See detailed H and P.   REVIEW OF SYSTEMS:  See detailed H and P.   PERTINENT PHYSICAL EXAM:  She is afebrile.  VITAL SIGNS:  Within normal limits.  GENERAL:  Well-developed female in some pain.  Pertinent findings noted of the abdomen which revealed a healed, low  midline incision, question mild distention and mild lower abdominal  tenderness.   LABORATORY:  White count was 11.6, hemoglobin 13.  Electrolytes and LFTs  normal.  Flat and upright abdominal x-ray, questioned early small bowel  obstruction.  CT scan of the abdomen and pelvis showed apparent small  bowel obstruction with transition point, probably adhesion in the  pelvis.   HOSPITAL COURSE:  Patient was admitted, initially treated with bowel  rest, IV fluids, NG suction.  Over the next 24 to 36 hours, she had  some  slight improvement in her abdominal x-rays but still had some distended  loops of small bowel and had persistent intermittent crampy lower  abdominal pain.  Due to apparent ongoing high-grade bowel obstruction,  recommend proceeding with laparotomy.  She was taken to operating room  on April 24, 2008, and had a section of distal small bowel which was  partially herniated up into the posterior sheath in the lower abdomen  with a lot of scarring and partial obstruction.  This was relieved with  lysis of adhesions.  Her postoperative course was smooth.  She had  fairly immediate relief of her abdominal pain and her NG tube was  removed on the 2nd postoperative day.  She was able to tolerate liquids  and her diet was gradually advanced which she tolerated well.  She began  having bowel movements on the 4th postoperative day.  She is discharged  home on April 29, 2008.  Wound is healing without infection.  Abdomen  is nontender.  Tolerating regular diet.   FOLLOWUP:  Is to be my office in 1 week.  She has a prescription for  Vicodin as needed.      [name]. Hoxworth, M.D.  Electronically Signed     BTH/MEDQ  D:  05/18/2008  T:  05/18/2008  Job:  [id number]

## 2010-08-19 NOTE — Discharge Summary (Signed)
Erica [name], Erica [name]                        ACCOUNT NO.:  [id number]   MEDICAL RECORD NO.:  [id number]                   PATIENT TYPE:  INP   LOCATION:  0354                                 FACILITY:  [facility]   PHYSICIAN:  Sheila Oats, M.D.             DATE OF BIRTH:  [birth year]   DATE OF ADMISSION:  10/18/2003  DATE OF DISCHARGE:  10/22/2003                                 DISCHARGE SUMMARY   DISCHARGE DIAGNOSES:  1. Small bowel obstruction--resolved.  2. Hypokalemia--resolved.  3. Health maintenance--strong family history of colon cancer, the patient     elected to followup with PCP.  Surveillance colonoscopy to be set up as     an outpatient.   PROCEDURE/STUDIES:  CT scan of abdomen, dilated small bowel loops with air  filled levels compatible with small bowel obstruction--distal small bowel  obstruction.  Mild obstruction.   CONSULTATIONS:  None.   HISTORY OF PRESENT ILLNESS:  The patient is a 60 year old female with past  medical history significant for multiple abdominal surgeries who presented  with a 24 hour history of nausea and abdominal pain, also had one episode of  vomiting.  A CT scan of the abdomen done in the ER revealed a distal small  bowel obstruction and the patient was admitted to the hospital service for  further evaluation and management. Suctioning/bowel rest.   PHYSICAL EXAMINATION:  VITAL SIGNS:  The physical exam upon admission  revealed a temperature of 97.3, pulse of 60, respiratory rate of 18 and  blood pressure of 110/97.  O2 sat of 96%.  HEENT:  Shows atraumatic, normocephalic.  NECK:  Supple with no thyromegaly.  CHEST:  There were a few scattered crackles.  CARDIOVASCULAR:  Regular rate and rhythm without murmurs.  ABDOMEN:  It was nontender, nondistended. The bowel sounds were decreased.  No masses palpable.  EXTREMITIES:  There was no clubbing, cyanosis or edema noted.  NEUROLOGIC:  Alert and oriented x3.  Nonfocal exam.   LABORATORY DATA:  Her white cell count was 13.5 with a hemoglobin of 10.  Her platelet count was 250,000. The sodium was 138, potassium 3.8.  Her  chloride was 108 with a CO2 of 24, BUN 9, creatinine 0.7 and her glucose  102.  Her LFT's were within normal limits and also the urinalysis was  negative for infection.  The chest x-ray did not show any acute disease.   HOSPITAL COURSE:  #1.  Distal small bowel obstruction. This was thought to  be secondary to adhesions.  The patient was admitted to the medical floor  and an NG tube placed to low intermittent suction and the patient was made  n.p.o. for bile rest and hydrated with IV fluids.  She was also started on  IV analgesics for pain control. The patient's vomiting resolved and the NG  tube was discontinued.  A followup abdominal plain film was done and it  showed improvement in  the small bowel obstruction.  The patient also  continued to improve symptomatically throughout her hospital stay with  resolution of abdominal pain, nausea and vomiting.  She was initially  started on a clear liquid diet and this was advanced as tolerated to a  regular diet.  Ms. Komperda is tolerating p.o. well at this time with no  nausea, vomiting and no abdominal pain. She is also hemodynamically stable.  She had some loose stools while she was in the hospital and the stool guaiac  was negative and also the stool WBC was negative as well as the C Dif toxin.  The patient also remained afebrile and hemodynamically stable throughout her  hospital stay and her white cell count prior to discharge is 5.6. She will  be discharged at this time to followup with her primary care physician, Dr.  Kathyrn Lass.  #2.  Hypokalemia secondary to GI loss, vomiting.  The patient's potassium  was repleted in the hospital and her potassium prior to discharge is 4.0.  #3.  Health maintenance.  The patient has a strong family history of colon  cancer and her last colonoscopy done  about 3 1/2 years ago was within normal  limits per patient report.  She is due to have a followup surveillance  colonoscopy and I discussed this with the patient and she elected to have it  set up as an outpatient by her primary care physician, Dr. Kathyrn Lass. As  already discussed, her stool guaiacs were negative in the hospital and also  her H&H remained stable throughout her hospital stay.  #4.  Tobacco abuse.  The patient counseled to quit tobacco.   DISCHARGE MEDICATIONS:  Protonix 40 mg, 1 p.o. q.d.   FOLLOW UP:  The patient is to followup with Dr. Kathyrn Lass in one week and  p.r.n.                                               Sheila Oats, M.D.    ACV/MEDQ  D:  10/22/2003  T:  10/22/2003  Job:  [id number]   cc:   Kathyrn Lass, M.D.  [place], [place] [address]  Fax: [phone]

## 2010-09-29 ENCOUNTER — Encounter: Payer: Self-pay | Admitting: Internal Medicine

## 2010-09-30 ENCOUNTER — Encounter: Payer: Self-pay | Admitting: Internal Medicine

## 2010-09-30 ENCOUNTER — Ambulatory Visit (INDEPENDENT_AMBULATORY_CARE_PROVIDER_SITE_OTHER): Payer: Managed Care, Other (non HMO) | Admitting: Internal Medicine

## 2010-09-30 VITALS — BP 128/80 | HR 77 | Temp 98.6°F | Ht 63.0 in | Wt 150.0 lb

## 2010-09-30 DIAGNOSIS — M545 Low back pain, unspecified: Secondary | ICD-10-CM

## 2010-09-30 DIAGNOSIS — R112 Nausea with vomiting, unspecified: Secondary | ICD-10-CM

## 2010-09-30 DIAGNOSIS — M25569 Pain in unspecified knee: Secondary | ICD-10-CM

## 2010-09-30 DIAGNOSIS — I10 Essential (primary) hypertension: Secondary | ICD-10-CM

## 2010-09-30 NOTE — Assessment & Plan Note (Signed)
Chronic, unchanged No weight changes - Follows with GI - ?crohns - not currently on specific tx since dc of entercort late 2011

## 2010-09-30 NOTE — Patient Instructions (Signed)
It was good to see you today. We have reviewed your prior records including labs and tests today we'll make referral to neurosurgery for another opinion on your back. Our office will contact you regarding appointment(s) once made. Medications reviewed, no changes at this time. Please schedule followup in 2-3 months for review and labs, call sooner if problems.

## 2010-09-30 NOTE — Assessment & Plan Note (Signed)
Chronic - eval by ortho spine (brooks) and failed ESI x 2 with ramos - Continued pain and gait problems (also s/p r TKR 03/2010) Refer to Nsurg for 2nd opinion

## 2010-09-30 NOTE — Assessment & Plan Note (Signed)
BP Readings from Last 3 Encounters:  09/30/10 128/80  06/16/10 102/74  05/13/10 138/80   The current medical regimen is effective;  continue present plan and medications.

## 2010-09-30 NOTE — Progress Notes (Signed)
Subjective:    Patient ID: Erica [name], female    DOB: January 18, 1951, 60 y.o.   MRN: [id number]  HPI Several concerns - back pain, GI issues  also reviewed chronic med issues:   colon cancer - dx 03/2009 and s/p r hemicolectomy for same - multiple SBO in past   crohns? - dx 02/2010 due to ulcerations (also NSAID induced in DDx) - follows with GI for same - failed trial entercort -symptoms unchanged, continued abd pain, nausea and vomiting - cont diffuse  bloating - no recent weight loss  HTN - reports compliance with ongoing medical treatment and no changes in medication dose or frequency. denies adverse side effects related to current therapy. 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 Takotsubo syndrome 12/2008  . ANEMIA   . OSTEOARTHRITIS   . Chronic systolic heart failure   . URINARY INCONTINENCE   . ADENOCARCINOMA, COLON, HX OF 03/2009  . BACK PAIN, LUMBAR   . HYPERTENSION   . HYPERLIPIDEMIA   . GERD   . HEART ATTACK 12/2008    takotsbo stress induced  . Crohn's ?dx 02/2010    ileal ulcers, intol of entercort  . Microscopic hematuria     chronic   Family History  Problem Relation Age of Onset  . Throat cancer Mother   . Colon cancer Father   . Hypertension Father   . Heart disease Father   . Kidney disease Father   . Colon cancer Sister   . Arthritis Other     Parent, other relative   History  Substance Use Topics  . Smoking status: Current Everyday Smoker    Types: Cigarettes  . Smokeless tobacco: Not on file   Comment: Married, lives with spouse. Originally from [place]. 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 Alcohol Use: No    Review of Systems  Constitutional: Negative for fever.  Respiratory: Positive for cough.   Musculoskeletal: Positive for back pain and gait problem.  No other specific complaints in a complete review of systems (except as listed in HPI above).      Objective:   Physical Exam BP 128/80  Pulse 77  Temp(Src) 98.6 F (37 C) (Oral)  Ht 5' 3"  (1.6 m)  Wt 150 lb (68.04 kg)  BMI 26.57 kg/m2  SpO2 96% Wt Readings from Last 3 Encounters:  09/30/10 150 lb (68.04 kg)  06/16/10 149 lb 8 oz (67.813 kg)  05/13/10 158 lb 6.4 oz (71.85 kg)  Physical Exam  Constitutional: She is oriented to person, place, and time. She appears well-developed and well-nourished. No distress.  HENT: Head: Normocephalic and atraumatic. Ears; B TMs ok, no erythema or effusion; Nose: Nose normal.  Mouth/Throat: Oropharynx is clear and moist. No oropharyngeal exudate.  Eyes: Conjunctivae and EOM are normal. Pupils are equal, round, and reactive to light. No scleral icterus.  Neck: Normal range of motion. Neck supple. No JVD present. No thyromegaly present.  Cardiovascular: Normal rate, regular rhythm and normal heart sounds.  No murmur heard. No BLE edema. Pulmonary/Chest: Effort  normal and breath sounds normal. No respiratory distress. She has no wheezes.  Abdominal: Soft. Bowel sounds are normal. She exhibits no distension. There is no tenderness.  Musculoskeletal: R>L knee with boggy synovitis, pain to palp at joint line, no abnormal warmth. Back: full range of motion of thoracic and lumbar spine. Non tender to palpation. DTR's are symmetrically intact. Sensation intact in all dermatomes of the lower extremities. Good strength to manual muscle testing but patient is able to heel toe walk with difficulty and ambulates with a gaurded gait, protective R leg/knee). Neurological: She is alert and oriented to person, place, and time. No cranial nerve deficit. Coordination normal.    Skin: Skin is warm and dry. No rash noted. No erythema.  Psychiatric: She has a normal mood and affect. Her behavior is normal. Judgment and thought content normal.       Assessment & Plan:  See problem list. Medications and labs reviewed today.  Time spent with pt today 41 minutes, greater than 50% time spent counseling patient on  Back/knee pain, GI issues and medication review. Also review of prior records

## 2010-09-30 NOTE — Assessment & Plan Note (Signed)
S/p TKR 03/2010 and manipulation 05/2010 - continued pain/problems with gait - ?realted to back - To see Nsurg for 2nd opinion on same - refer done as noted

## 2010-11-18 ENCOUNTER — Ambulatory Visit (INDEPENDENT_AMBULATORY_CARE_PROVIDER_SITE_OTHER): Payer: Managed Care, Other (non HMO) | Admitting: Internal Medicine

## 2010-11-18 ENCOUNTER — Ambulatory Visit: Payer: Managed Care, Other (non HMO)

## 2010-11-18 ENCOUNTER — Encounter: Payer: Self-pay | Admitting: Internal Medicine

## 2010-11-18 VITALS — BP 100/72 | HR 66 | Temp 97.1°F | Ht 63.0 in | Wt 143.8 lb

## 2010-11-18 DIAGNOSIS — R252 Cramp and spasm: Secondary | ICD-10-CM

## 2010-11-18 DIAGNOSIS — M545 Low back pain, unspecified: Secondary | ICD-10-CM

## 2010-11-18 DIAGNOSIS — R5383 Other fatigue: Secondary | ICD-10-CM

## 2010-11-18 DIAGNOSIS — R300 Dysuria: Secondary | ICD-10-CM

## 2010-11-18 DIAGNOSIS — Z23 Encounter for immunization: Secondary | ICD-10-CM

## 2010-11-18 DIAGNOSIS — R5381 Other malaise: Secondary | ICD-10-CM

## 2010-11-18 LAB — IBC PANEL
Iron: 84 ug/dL (ref 42–145)
Saturation Ratios: 24 % (ref 20.0–50.0)
Transferrin: 250.3 mg/dL (ref 212.0–360.0)

## 2010-11-18 LAB — CBC WITH DIFFERENTIAL/PLATELET
Basophils Absolute: 0 10*3/uL (ref 0.0–0.1)
Basophils Relative: 0.6 % (ref 0.0–3.0)
Eosinophils Absolute: 0.1 10*3/uL (ref 0.0–0.7)
Eosinophils Relative: 1.4 % (ref 0.0–5.0)
HCT: 39.9 % (ref 36.0–46.0)
Hemoglobin: 13.3 g/dL (ref 12.0–15.0)
Lymphocytes Relative: 36.3 % (ref 12.0–46.0)
Lymphs Abs: 3.1 10*3/uL (ref 0.7–4.0)
MCHC: 33.3 g/dL (ref 30.0–36.0)
MCV: 93 fl (ref 78.0–100.0)
Monocytes Absolute: 0.5 10*3/uL (ref 0.1–1.0)
Monocytes Relative: 5.8 % (ref 3.0–12.0)
Neutro Abs: 4.7 10*3/uL (ref 1.4–7.7)
Neutrophils Relative %: 55.9 % (ref 43.0–77.0)
Platelets: 241 10*3/uL (ref 150.0–400.0)
RBC: 4.29 Mil/uL (ref 3.87–5.11)
RDW: 13.8 % (ref 11.5–14.6)
WBC: 8.4 10*3/uL (ref 4.5–10.5)

## 2010-11-18 LAB — BASIC METABOLIC PANEL
BUN: 14 mg/dL (ref 6–23)
CO2: 29 mEq/L (ref 19–32)
Calcium: 9.6 mg/dL (ref 8.4–10.5)
Chloride: 106 mEq/L (ref 96–112)
Creatinine, Ser: 0.8 mg/dL (ref 0.4–1.2)
GFR: 82.32 mL/min (ref >60.00)
Glucose, Bld: 98 mg/dL (ref 70–99)
Potassium: 4.7 mEq/L (ref 3.5–5.1)
Sodium: 140 mEq/L (ref 135–145)

## 2010-11-18 LAB — URINALYSIS, ROUTINE W REFLEX MICROSCOPIC
Bilirubin Urine: NEGATIVE
Ketones, ur: NEGATIVE
Leukocytes, UA: NEGATIVE
Nitrite: NEGATIVE
Specific Gravity, Urine: >=1.03 (ref 1.000–1.030)
Total Protein, Urine: NEGATIVE
Urine Glucose: NEGATIVE
Urobilinogen, UA: 0.2 (ref 0.0–1.0)
pH: 5 (ref 5.0–8.0)

## 2010-11-18 LAB — HEPATIC FUNCTION PANEL
ALT: 14 U/L (ref 0–35)
AST: 18 U/L (ref 0–37)
Albumin: 4 g/dL (ref 3.5–5.2)
Alkaline Phosphatase: 73 U/L (ref 39–117)
Bilirubin, Direct: 0.1 mg/dL (ref 0.0–0.3)
Total Bilirubin: 0.6 mg/dL (ref 0.3–1.2)
Total Protein: 6.8 g/dL (ref 6.0–8.3)

## 2010-11-18 LAB — TSH: TSH: 0.5 u[IU]/mL (ref 0.35–5.50)

## 2010-11-18 LAB — MAGNESIUM: Magnesium: 1.9 mg/dL (ref 1.5–2.5)

## 2010-11-18 MED ORDER — HYDROCODONE-ACETAMINOPHEN 5-325 MG PO TABS: 0.5000 | ORAL_TABLET | Freq: Four times a day (QID) | ORAL | Status: DC | PRN

## 2010-11-18 NOTE — Progress Notes (Signed)
  Subjective:    Patient ID: Erica [name], female    DOB: 1950/09/12, 60 y.o.   MRN: [id number]  HPI here for follow up  Several concerns - continued back pain, fatigue, dysuria Back and LLE pain not improved status post ESI  also reviewed chronic med issues:   colon cancer - dx 03/2009 and s/p r hemicolectomy for same - multiple SBO in past   crohns? - dx 02/2010 due to ulcerations (also NSAID induced in DDx) - follows with GI for same - failed trial entercort -symptoms unchanged, continued abd pain, nausea and vomiting - cont diffuse  bloating - no unintentional weight loss  HTN - reports compliance with ongoing medical treatment and no changes in medication dose or frequency. denies adverse side effects related to current therapy. 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 Takotsubo syndrome 12/2008  . ANEMIA   . OSTEOARTHRITIS   . Chronic systolic heart failure   . URINARY INCONTINENCE   . ADENOCARCINOMA, COLON, HX OF 03/2009  . BACK PAIN, LUMBAR   . HYPERTENSION   . HYPERLIPIDEMIA   . GERD   . HEART ATTACK 12/2008    takotsbo stress induced  . Crohn's ?dx 02/2010    ileal ulcers, intol of entercort  . Microscopic hematuria     chronic    Review of Systems  Constitutional: Negative for fever.  Respiratory: Negative for cough.   Musculoskeletal: Positive for back pain and gait problem.  Neurological: Negative for headaches.       Objective:   Physical Exam  BP 100/72  Pulse 66  Temp(Src) 97.1 F (36.2 C) (Oral)  Ht 5' 3"  (1.6 m)  Wt 143 lb 12.8 oz (65.227 kg)   BMI 25.47 kg/m2  SpO2 95% Wt Readings from Last 3 Encounters:  11/18/10 143 lb 12.8 oz (65.227 kg)  09/30/10 150 lb (68.04 kg)  06/16/10 149 lb 8 oz (67.813 kg)   Constitutional: She appears well-developed and well-nourished. No distress.  Neck: Normal range of motion. Neck supple. No JVD present. No thyromegaly present.  Cardiovascular: Normal rate, regular rhythm and normal heart sounds.  No murmur heard. No BLE edema. Pulmonary/Chest: Effort normal and breath sounds normal. No respiratory distress. She has no wheezes.  Musculoskeletal: R>L knee with boggy synovitis, pain to palp at joint line, no abnormal warmth. the patient walks with difficulty and ambulates with a gaurded gait, protective R leg/knee. Neurological: She is alert and oriented to person, place, and time. No cranial nerve deficit. Coordination normal.  Psychiatric: She has a normal mood and affect. Her behavior is normal. Judgment and thought content normal.        Assessment & Plan:  See problem list. Medications and labs reviewed today.  Fatigue - nonspecific hx/exam - check labs

## 2010-11-18 NOTE — Assessment & Plan Note (Signed)
Chronic - prior eval by ortho spine (brooks) and failed ESI x 2 2011-spring 2012 with ramos - Continued pain and gait problems (also s/p r TKR 03/2010) Referred to Nsurg for 2nd opinion 10/2010 - s/p repeat ESI by Dr. Maryjean Ka - Continue to follow up for ongoing eval and tx - renew rx for norco per request (rare/sparing use)

## 2010-11-18 NOTE — Patient Instructions (Addendum)
It was good to see you today. We have reviewed your prior records including your visit with neurosurgery Test(s) ordered today. Your results will be called to you after review (48-72hours after test completion). If any changes need to be made, you will be notified at that time. Medications reviewed, no changes at this time. Refill on medication(s) as discussed today. Shingles shot (zostavax) done today

## 2010-12-26 ENCOUNTER — Ambulatory Visit (INDEPENDENT_AMBULATORY_CARE_PROVIDER_SITE_OTHER): Payer: Managed Care, Other (non HMO) | Admitting: Internal Medicine

## 2010-12-26 ENCOUNTER — Encounter: Payer: Self-pay | Admitting: Internal Medicine

## 2010-12-26 VITALS — BP 128/76 | HR 80 | Ht 62.0 in | Wt 144.0 lb

## 2010-12-26 DIAGNOSIS — K5080 Crohn's disease of both small and large intestine without complications: Secondary | ICD-10-CM

## 2010-12-26 DIAGNOSIS — Z860100 Personal history of colon polyps, unspecified: Secondary | ICD-10-CM | POA: Insufficient documentation

## 2010-12-26 MED ORDER — PEG-KCL-NACL-NASULF-NA ASC-C 100 G PO SOLR: 1.0000 | Freq: Once | ORAL | Status: DC

## 2010-12-26 NOTE — Progress Notes (Signed)
  Subjective:    Patient ID: Erica [name], female    DOB: November 01, 1950, 60 y.o.   MRN: [id number]  HPI 60 yo ww here for follow-up of intestinal ulcers and ? Of Crohn's disease. Some foods will cause urgent diarrhea, e.g. [employer] hamburger, peppers, corn, certain dairy (milk). Some mild cramping at times. No bleeding.  Weight stable. Still has bloating at times. These symptoms remind her of "malabsorption" she had when she was pregnant with her daughter. Stool frequency 1-2 a day and may go 2 days without a stool.  Review of Systems Second set of spine injections today. Trying to avoid back surgery.Tolerating right knee pain and does not want replacement. Grandchildren have moved to [place] (with their mother - these are son's kids)     Objective:   Physical Exam General: WDWN NAD Eyes: anicteric Lungs: clear Heart: S1S2 no rubs, murmurs or gallops Abdomen: soft and tender in RLQ, BS+ Ext: no edema          Assessment & Plan:

## 2010-12-26 NOTE — Patient Instructions (Addendum)
You have been given a separate informational sheet regarding your tobacco use, the importance of quitting and local resources to help you quit. You have been scheduled for a Colonoscopy with separate instructions given. Your prep kit has been sent to your pharmacy for you to pick up.

## 2010-12-29 DIAGNOSIS — K5080 Crohn's disease of both small and large intestine without complications: Secondary | ICD-10-CM | POA: Insufficient documentation

## 2010-12-29 NOTE — Assessment & Plan Note (Signed)
Repeat colonoscopy to assess disease status.

## 2010-12-29 NOTE — Assessment & Plan Note (Signed)
Colonoscopy surveillance

## 2011-01-13 ENCOUNTER — Encounter: Payer: Self-pay | Admitting: Endocrinology

## 2011-01-13 ENCOUNTER — Ambulatory Visit (INDEPENDENT_AMBULATORY_CARE_PROVIDER_SITE_OTHER): Payer: Managed Care, Other (non HMO) | Admitting: Endocrinology

## 2011-01-13 ENCOUNTER — Other Ambulatory Visit (INDEPENDENT_AMBULATORY_CARE_PROVIDER_SITE_OTHER): Payer: Managed Care, Other (non HMO)

## 2011-01-13 VITALS — BP 122/78 | HR 72 | Temp 98.6°F | Ht 62.5 in | Wt 146.0 lb

## 2011-01-13 DIAGNOSIS — M542 Cervicalgia: Secondary | ICD-10-CM

## 2011-01-13 DIAGNOSIS — R51 Headache: Secondary | ICD-10-CM

## 2011-01-13 LAB — SEDIMENTATION RATE: Sed Rate: 26 mm/hr — ABNORMAL HIGH (ref 0–22)

## 2011-01-13 MED ORDER — CEFUROXIME AXETIL 250 MG PO TABS: 250.0000 mg | ORAL_TABLET | Freq: Two times a day (BID) | ORAL | Status: AC

## 2011-01-13 NOTE — Progress Notes (Signed)
Subjective:    Patient ID: Erica [name], female    DOB: 15-Apr-1950, 60 y.o.   MRN: [id number]  HPI Pt states 9 days of slight headache, worst at the eyes, and assoc pain at the back of the neck.  She is unable to cite precip factor.  Past Medical History  Diagnosis Date  . Takotsubo syndrome 12/2008  . ANEMIA   . OSTEOARTHRITIS   . URINARY INCONTINENCE   . ADENOCARCINOMA, COLON, HX OF 03/2009  . BACK PAIN, LUMBAR   . HYPERTENSION   . HYPERLIPIDEMIA   . GERD   . HEART ATTACK 12/2008    takotsbo stress induced  . Crohn's  02/2010    ileal ulcers, intol of entercort  . Microscopic hematuria     chronic  . CHF (congestive heart failure)   . History of transfusion of whole blood     Past Surgical History  Procedure Date  . Cesarean section     x 3  . Abdominal hysterectomy 1994  . Colon surgery 03/2009    hemicolectomy colon cancer  . Cholecystectomy 1995  . Tonsillectomy 1952  . Appendectomy 1964  . Hand surgery 1991  . Knee surgery 1981    bilateral  . Right knee replacement 03/2010  . Upper gastrointestinal endoscopy 05/16/2010    normal  . Colonoscopy w/ biopsies and polypectomy 01/25/2010    (Crohn's)ileitis w/erosion/ulceration, polyps, internal hemorrhoids  . Breast surgery     History   Social History  . Marital Status: Married    Spouse Name: N/A    Number of Children: N/A  . Years of Education: N/A   Occupational History  . Retired     Social History Main Topics  . Smoking status: Current Everyday Smoker    Types: Cigarettes  . Smokeless tobacco: Never Used   Comment: Married, lives with spouse. Originally from [place]. 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 Alcohol Use: No  . Drug Use: No  . Sexually Active: Not on file   Other Topics Concern  . Not on file   Social History Narrative  . No narrative on file    Current Outpatient Prescriptions on File Prior to Visit  Medication  Sig Dispense Refill  . HYDROcodone-acetaminophen (NORCO) 5-325 MG per tablet Take 0.5 tablets by mouth every 6 (six) hours as needed for pain.  60 tablet  0  . metoprolol (TOPROL-XL) 50 MG 24 hr tablet Take 50 mg by mouth daily.          Allergies  Allergen Reactions  . Morphine     REACTION: Nausea \T\ vomitting    Family History  Problem Relation Age of Onset  . Throat cancer Mother   . Colon cancer Father   . Hypertension Father   . Heart disease Father   . Kidney disease Father   . Colon cancer Sister   . Arthritis Other     Parent, other relative   BP 122/78  Pulse 72  Temp(Src) 98.6 F (37 C) (Oral)  Ht 5' 2.5" (1.588 m)  Wt 146 lb (66.225 kg)  BMI 26.28 kg/m2  SpO2 95%  Review of Systems Denies earache and nasal congestion.      Objective:   Physical Exam VITAL SIGNS:  See vs page GENERAL: no distress head: no deformity eyes: no periorbital swelling, no proptosis external nose and ears are normal mouth: no lesion seen Both eac's and tm's  are normal NECK: full rom, but rom is painful.  There is no palpable thyroid enlargement.  No thyroid nodule is palpable.  No palpable lymphadenopathy at the neck.    Esr=normal    Assessment & Plan:  Headache, prob due to [facility]

## 2011-01-13 NOTE — Patient Instructions (Addendum)
i have sent a prescription to your pharmacy, for an antibiotic. blood tests are being requested for you today.  please call [phone] to hear your test results.  You will be prompted to enter the 9-digit "MRN" number that appears at the top left of this page, followed by #.  Then you will hear the message.

## 2011-01-24 ENCOUNTER — Encounter: Payer: Self-pay | Admitting: Internal Medicine

## 2011-01-24 ENCOUNTER — Ambulatory Visit (AMBULATORY_SURGERY_CENTER): Payer: Managed Care, Other (non HMO) | Admitting: Internal Medicine

## 2011-01-24 VITALS — BP 135/78 | HR 79 | Temp 97.6°F | Resp 20 | Ht 62.0 in | Wt 144.0 lb

## 2011-01-24 DIAGNOSIS — Z860100 Personal history of colon polyps, unspecified: Secondary | ICD-10-CM

## 2011-01-24 DIAGNOSIS — K5190 Ulcerative colitis, unspecified, without complications: Secondary | ICD-10-CM

## 2011-01-24 DIAGNOSIS — K5080 Crohn's disease of both small and large intestine without complications: Secondary | ICD-10-CM

## 2011-01-24 DIAGNOSIS — Z800 Family history of malignant neoplasm of digestive organs: Secondary | ICD-10-CM

## 2011-01-24 HISTORY — PX: COLONOSCOPY W/ BIOPSIES AND POLYPECTOMY: SHX1376

## 2011-01-24 MED ORDER — SODIUM CHLORIDE 0.9 % IV SOLN: 500.0000 mL | INTRAVENOUS | Status: DC

## 2011-01-24 NOTE — Patient Instructions (Signed)
There was inflammation in the small intestine again, though not as severe as last year.  Will contact you with results by phone or mail. Please go ahead and schedule a follow-up visit with me - in mid to late November. Call the office this week to set that up. Please try to quit smoking - will discuss more but this has been shown to alleviate Crohn's problems, among other things.  Gatha Mayer, MD, [facility]

## 2011-01-24 NOTE — Progress Notes (Signed)
md visualized anastamosis with procedure. ewm

## 2011-01-25 ENCOUNTER — Telehealth: Payer: Self-pay | Admitting: *Deleted

## 2011-01-25 NOTE — Telephone Encounter (Signed)
Called number with no answer and no first or last name on message so no message left. ewm

## 2011-01-31 ENCOUNTER — Encounter: Payer: Self-pay | Admitting: Internal Medicine

## 2011-01-31 NOTE — Progress Notes (Signed)
Quick Note:  Colonoscopy recall 2 years Oct 2014due to personal and family hx colon cancer ______

## 2011-02-07 ENCOUNTER — Ambulatory Visit (INDEPENDENT_AMBULATORY_CARE_PROVIDER_SITE_OTHER): Payer: Managed Care, Other (non HMO) | Admitting: Internal Medicine

## 2011-02-07 ENCOUNTER — Encounter: Payer: Self-pay | Admitting: Internal Medicine

## 2011-02-07 VITALS — BP 130/78 | HR 79 | Temp 98.5°F | Wt 146.8 lb

## 2011-02-07 DIAGNOSIS — J329 Chronic sinusitis, unspecified: Secondary | ICD-10-CM

## 2011-02-07 DIAGNOSIS — R05 Cough: Secondary | ICD-10-CM

## 2011-02-07 DIAGNOSIS — R1310 Dysphagia, unspecified: Secondary | ICD-10-CM

## 2011-02-07 DIAGNOSIS — R51 Headache: Secondary | ICD-10-CM

## 2011-02-07 DIAGNOSIS — R059 Cough, unspecified: Secondary | ICD-10-CM

## 2011-02-07 MED ORDER — NYSTATIN 100000 UNIT/ML MT SUSP: 500000.0000 [IU] | Freq: Four times a day (QID) | OROMUCOSAL | Status: DC

## 2011-02-07 MED ORDER — HYDROCODONE-HOMATROPINE 5-1.5 MG/5ML PO SYRP: 5.0000 mL | ORAL_SOLUTION | Freq: Four times a day (QID) | ORAL | Status: DC | PRN

## 2011-02-07 MED ORDER — FLUTICASONE PROPIONATE 50 MCG/ACT NA SUSP: 2.0000 | Freq: Every day | NASAL | Status: DC

## 2011-02-07 NOTE — Patient Instructions (Signed)
It was good to see you today. Use Flonase for sinus symptoms, cough syrup for congestion/cough and nystatin solution for throat pain - Also keep hydrated and use tylenol as needed Your prescription(s) have been submitted to your pharmacy. Please take as directed and contact our office if you believe you are having problem(s) with the medication(s).

## 2011-02-07 NOTE — Progress Notes (Signed)
Subjective:    Patient ID: Erica [name], female    DOB: 1951-03-14, 60 y.o.   MRN: [id number]  HPI  complains of continued headache  Precipitated by URI symptoms 1 month ago. Also exacerbated by stress (worrying about family -grandkids moving in, spouse upcoming retirement) Denies fever, visual change or weakness Associated with sore throat but no voice change Sinus pressure and congestion with postnasal drip and cough Not improved with antibiotics prescribed 2 weeks ago Also and improved with over-the-counter meds for cough  Past Medical History  Diagnosis Date  . Takotsubo syndrome 12/2008  . ANEMIA   . OSTEOARTHRITIS   . URINARY INCONTINENCE   . ADENOCARCINOMA, COLON, HX OF 03/2009  . BACK PAIN, LUMBAR   . HYPERTENSION   . HYPERLIPIDEMIA   . GERD   . HEART ATTACK 12/2008    takotsbo stress induced  . Crohn's  02/2010    ileal ulcers, intol of entercort  . Microscopic hematuria     chronic  . CHF (congestive heart failure)   . History of transfusion of whole blood     Review of Systems  Constitutional: Positive for fatigue. Negative for fever and unexpected weight change.  Respiratory: Negative for chest tightness, shortness of breath and wheezing.   Neurological: Negative for dizziness, seizures, facial asymmetry, speech difficulty and numbness.       Objective:   Physical Exam BP 130/78  Pulse 79  Temp(Src) 98.5 F (36.9 C) (Oral)  Wt 146 lb 12.8 oz (66.588 kg)  SpO2 97% Wt Readings from Last 3 Encounters:  02/07/11 146 lb 12.8 oz (66.588 kg)  01/24/11 144 lb (65.318 kg)  01/13/11 146 lb (66.225 kg)   Constitutional: She appears well-developed and well-nourished. No distress.  HENT: Head: Normocephalic and atraumatic. Ears: B TMs ok, no erythema or effusion; Nose: clear rhinorrhea. Mouth/Throat: Tongue with white plaques, Oropharynx is red but clear and moist. No oropharyngeal exudate.  Eyes: Conjunctivae and EOM are normal. Pupils are equal, round,  and reactive to light. No scleral icterus.  Neck: Normal range of motion. Neck supple. No JVD present. No thyromegaly present.  Cardiovascular: Normal rate, regular rhythm and normal heart sounds.  No murmur heard. No BLE edema. Pulmonary/Chest: Effort normal and breath sounds normal. No respiratory distress. She has no wheezes.  Neurological: She is alert and oriented to person, place, and time. No cranial nerve deficit. Coordination and balance normal.  Skin: Skin is warm and dry. No rash noted. No erythema.  Psychiatric: She has a normal mood and affect. Her behavior is normal. Judgment and thought content normal.    Lab Results  Component Value Date   WBC 8.4 11/18/2010   HGB 13.3 11/18/2010   HCT 39.9 11/18/2010   PLT 241.0 11/18/2010   GLUCOSE 98 11/18/2010   CHOL 225* 02/16/2010   TRIG 171.0* 02/16/2010   HDL 53.40 02/16/2010   LDLDIRECT 147.5 02/16/2010   LDLCALC  Value: 109        Total Cholesterol/HDL:CHD Risk Coronary Heart Disease Risk Table                     Men   Women  1/2 Average Risk   3.4   3.3  Average Risk       5.0   4.4  2 X Average Risk   9.6   7.1  3 X Average Risk  23.4   11.0        Use the calculated Patient Ratio above  and the CHD Risk Table to determine the patient's CHD Risk.        ATP III CLASSIFICATION (LDL):  <100     mg/dL   Optimal  100-129  mg/dL   Near or Above                    Optimal  130-159  mg/dL   Borderline  160-189  mg/dL   High  >190     mg/dL   Very High* 02/11/2009   ALT 14 11/18/2010   AST 18 11/18/2010   NA 140 11/18/2010   K 4.7 11/18/2010   CL 106 11/18/2010   CREATININE 0.8 11/18/2010   BUN 14 11/18/2010   CO2 29 11/18/2010   TSH 0.50 11/18/2010   INR 0.95 03/29/2010   HGBA1C  Value: 5.4 (NOTE) The ADA recommends the following therapeutic goal for glycemic control related to Hgb A1c measurement: Goal of therapy: <6.5 Hgb A1c  Reference: American Diabetes Association: Clinical Practice Recommendations 2010, Diabetes Care, 2010, 33: (Suppl  1).  02/10/2009   Lab Results  Component Value Date   ESRSEDRATE 26* 01/13/2011        Assessment & Plan:  headache - neuro exam benign, recent ESR and OV reviewed - describes as "stabbing-like" pains across crown and ears, also underneath neck and jaw - lasting only seconds. Not typical of migraines or trigeminal neuralgia. No evidence for infectious etiology. Precipitated by stress and recent URI. Supportive care recommended and patient to followup if persisting or worsening symptoms  phayngitis/odynophagia - ongoing >4 weeks since URI  - try nystatin susp mouthwash and follow up GI if continued symptoms  sinusitus - contributing to above headache and PND- add nasal steroid and antitussives for dry cough

## 2011-02-08 LAB — HM MAMMOGRAPHY: HM Mammogram: NEGATIVE

## 2011-02-09 ENCOUNTER — Encounter: Payer: Self-pay | Admitting: Internal Medicine

## 2011-02-22 ENCOUNTER — Encounter: Payer: Self-pay | Admitting: Internal Medicine

## 2011-02-22 ENCOUNTER — Other Ambulatory Visit: Payer: Self-pay | Admitting: Internal Medicine

## 2011-02-22 ENCOUNTER — Other Ambulatory Visit (INDEPENDENT_AMBULATORY_CARE_PROVIDER_SITE_OTHER): Payer: Managed Care, Other (non HMO)

## 2011-02-22 ENCOUNTER — Ambulatory Visit (INDEPENDENT_AMBULATORY_CARE_PROVIDER_SITE_OTHER): Payer: Managed Care, Other (non HMO) | Admitting: Internal Medicine

## 2011-02-22 VITALS — BP 148/80 | HR 94 | Temp 99.1°F | Ht 66.0 in | Wt 148.0 lb

## 2011-02-22 DIAGNOSIS — Z0000 Encounter for general adult medical examination without abnormal findings: Secondary | ICD-10-CM

## 2011-02-22 DIAGNOSIS — J209 Acute bronchitis, unspecified: Secondary | ICD-10-CM

## 2011-02-22 DIAGNOSIS — F419 Anxiety disorder, unspecified: Secondary | ICD-10-CM

## 2011-02-22 DIAGNOSIS — F411 Generalized anxiety disorder: Secondary | ICD-10-CM

## 2011-02-22 LAB — CBC WITH DIFFERENTIAL/PLATELET
Basophils Absolute: 0.1 10*3/uL (ref 0.0–0.1)
Basophils Relative: 1.3 % (ref 0.0–3.0)
Eosinophils Absolute: 0.1 10*3/uL (ref 0.0–0.7)
Eosinophils Relative: 1.5 % (ref 0.0–5.0)
HCT: 39.1 % (ref 36.0–46.0)
Hemoglobin: 13.3 g/dL (ref 12.0–15.0)
Lymphocytes Relative: 22 % (ref 12.0–46.0)
Lymphs Abs: 2 10*3/uL (ref 0.7–4.0)
MCHC: 34 g/dL (ref 30.0–36.0)
MCV: 92.2 fl (ref 78.0–100.0)
Monocytes Absolute: 1 10*3/uL (ref 0.1–1.0)
Monocytes Relative: 10.7 % (ref 3.0–12.0)
Neutro Abs: 5.7 10*3/uL (ref 1.4–7.7)
Neutrophils Relative %: 64.5 % (ref 43.0–77.0)
Platelets: 245 10*3/uL (ref 150.0–400.0)
RBC: 4.24 Mil/uL (ref 3.87–5.11)
RDW: 13.5 % (ref 11.5–14.6)
WBC: 8.9 10*3/uL (ref 4.5–10.5)

## 2011-02-22 LAB — URINALYSIS, ROUTINE W REFLEX MICROSCOPIC
Bilirubin Urine: NEGATIVE
Ketones, ur: NEGATIVE
Leukocytes, UA: NEGATIVE
Nitrite: NEGATIVE
Specific Gravity, Urine: >=1.03 (ref 1.000–1.030)
Total Protein, Urine: NEGATIVE
Urine Glucose: NEGATIVE
Urobilinogen, UA: 0.2 (ref 0.0–1.0)
pH: 5.5 (ref 5.0–8.0)

## 2011-02-22 LAB — HEPATIC FUNCTION PANEL
ALT: 17 U/L (ref 0–35)
AST: 17 U/L (ref 0–37)
Albumin: 4.2 g/dL (ref 3.5–5.2)
Alkaline Phosphatase: 81 U/L (ref 39–117)
Bilirubin, Direct: 0.1 mg/dL (ref 0.0–0.3)
Total Bilirubin: 0.5 mg/dL (ref 0.3–1.2)
Total Protein: 7.4 g/dL (ref 6.0–8.3)

## 2011-02-22 LAB — LIPID PANEL
Cholesterol: 200 mg/dL (ref 0–200)
HDL: 63.1 mg/dL (ref >39.00)
LDL Cholesterol: 101 mg/dL — ABNORMAL HIGH (ref 0–99)
Total CHOL/HDL Ratio: 3
Triglycerides: 180 mg/dL — ABNORMAL HIGH (ref 0.0–149.0)
VLDL: 36 mg/dL (ref 0.0–40.0)

## 2011-02-22 LAB — TSH: TSH: 0.45 u[IU]/mL (ref 0.35–5.50)

## 2011-02-22 LAB — BASIC METABOLIC PANEL
BUN: 17 mg/dL (ref 6–23)
CO2: 30 mEq/L (ref 19–32)
Calcium: 9.5 mg/dL (ref 8.4–10.5)
Chloride: 104 mEq/L (ref 96–112)
Creatinine, Ser: 0.9 mg/dL (ref 0.4–1.2)
GFR: 71.31 mL/min (ref >60.00)
Glucose, Bld: 101 mg/dL — ABNORMAL HIGH (ref 70–99)
Potassium: 3.8 mEq/L (ref 3.5–5.1)
Sodium: 142 mEq/L (ref 135–145)

## 2011-02-22 MED ORDER — PREDNISONE (PAK) 10 MG PO TABS: 10.0000 mg | ORAL_TABLET | ORAL | Status: AC

## 2011-02-22 MED ORDER — ALBUTEROL 90 MCG/ACT IN AERS: 2.0000 | INHALATION_SPRAY | Freq: Four times a day (QID) | RESPIRATORY_TRACT | Status: DC | PRN

## 2011-02-22 MED ORDER — ALPRAZOLAM 0.5 MG PO TABS: 0.5000 mg | ORAL_TABLET | Freq: Three times a day (TID) | ORAL | Status: AC | PRN

## 2011-02-22 MED ORDER — IPRATROPIUM BROMIDE 0.03 % NA SOLN: 2.0000 | Freq: Three times a day (TID) | NASAL | Status: DC

## 2011-02-22 MED ORDER — LEVOFLOXACIN 500 MG PO TABS: 500.0000 mg | ORAL_TABLET | Freq: Every day | ORAL | Status: AC

## 2011-02-22 NOTE — Patient Instructions (Signed)
It was good to see you today. Levaquin antibiotics, prednisone x 6 days, Atrovent nose spray - Your prescription(s) have been submitted to your pharmacy. Please take as directed and contact our office if you believe you are having problem(s) with the medication(s). Test(s) ordered today. Your results will be called to you after review (48-72hours after test completion). If any changes need to be made, you will be notified at that time.

## 2011-02-22 NOTE — Progress Notes (Signed)
Subjective:    Patient ID: Erica [name], female    DOB: Oct 17, 1950, 60 y.o.   MRN: [id number]  HPI patient is here today for annual physical. Patient feels well overall.  Complains of continued chest congestion and cough - runny nose and sneezing Unimproved with antibiotic therapy in mid October 2012 and symptomatic care 2 weeks ago complains of overwhelming fatigue but no fever  also reviewed chronic med issues:   colon cancer - dx 03/2009 and s/p r hemicolectomy for same - multiple SBO in past   crohns - dx 02/2010 due to ulcerations (also NSAID induced in DDx) - follows with GI for same - failed trial entercort -symptoms unchanged, continued abd pain, nausea and vomiting - cont diffuse  bloating - no unintentional weight loss  HTN - reports compliance with ongoing medical treatment and no changes in medication dose or frequency. denies adverse side effects related to current therapy. 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 Takotsubo syndrome 12/2008  . ANEMIA   . OSTEOARTHRITIS   . URINARY INCONTINENCE   . ADENOCARCINOMA, COLON, HX OF 03/2009  . BACK PAIN, LUMBAR   . HYPERTENSION   . HYPERLIPIDEMIA   . GERD   . HEART ATTACK 12/2008    takotsbo stress induced  . Crohn's  02/2010    ileal ulcers, intol of entercort  . Microscopic hematuria     chronic  . CHF (congestive heart failure)   . History of transfusion of whole blood    Past Surgical History  Procedure Date  . Cesarean section     x 3  . Abdominal hysterectomy 1994  . Colon surgery 03/2009   hemicolectomy colon cancer  . Cholecystectomy 1995  . Tonsillectomy 1952  . Appendectomy 1964  . Hand surgery 1991  . Knee surgery 1981    bilateral  . Right knee replacement 03/2010  . Upper gastrointestinal endoscopy 05/16/2010    normal  . Colonoscopy w/ biopsies and polypectomy 01/24/2011    (Crohn's)ileitis, internal hemorrhoids  . Breast surgery    Family History  Problem Relation Age of Onset  . Throat cancer Mother   . Colon cancer Father   . Hypertension Father   . Heart disease Father   . Kidney disease Father   . Colon cancer Sister   . Arthritis Other     Parent, other relative   History  Substance Use Topics  . Smoking status: Current Everyday Smoker    Types: Cigarettes  . Smokeless tobacco: Never Used   Comment: Married, lives with spouse. Originally from [place]. 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 Alcohol Use: No   Review of Systems  Constitutional: Negative for fever.  Respiratory: Negative for cough.   Musculoskeletal: Positive for back pain and gait problem.  Neurological: Negative for headaches.  Cardiovascular: Negative for chest pain or palpitations.  No other specific complaints in a complete review of systems (except as listed in  HPI above).      Objective:   Physical Exam  BP 148/80  Pulse 94  Temp(Src) 99.1 F (37.3 C) (Oral)  Ht 5' 6"  (1.676 m)  Wt 148 lb (67.132 kg)  BMI 23.89 kg/m2  SpO2 96%  Wt Readings from Last 3 Encounters:  02/22/11 148 lb (67.132 kg)  02/07/11 146 lb 12.8 oz (66.588 kg)  01/24/11 144 lb (65.318 kg)   Constitutional: She appears well-developed and well-nourished. No distress.  2 grandchildren at side HENT: Head: Normocephalic and atraumatic. Ears: B TMs ok, no erythema or effusion; Nose: Clear rhinorrhea. Mouth/Throat: Oropharynx is moderately red but clear and moist. No oropharyngeal exudate.  Eyes: Conjunctivae and EOM are normal. Pupils are  equal, round, and reactive to light. No scleral icterus.  Neck: Normal range of motion. Neck supple. No JVD present. No thyromegaly present.  Cardiovascular: Normal rate, regular rhythm and normal heart sounds.  No murmur heard. No BLE edema. Pulmonary/Chest: breath sounds with rhonchi bilaterally. No respiratory distress. She has expiratory wheezes.  Abdominal: Soft. Bowel sounds are normal. She exhibits no distension. There is no tenderness. no masses Musculoskeletal: Normal range of motion, no joint effusions. No gross deformities Neurological: She is alert and oriented to person, place, and time. No cranial nerve deficit. Coordination normal.  Skin: Skin is warm and dry. No rash noted. No erythema.  Psychiatric: She has an appropriately anxious mood and affect. Her behavior is normal. Judgment and thought content normal.   Lab Results  Component Value Date   WBC 8.4 11/18/2010   HGB 13.3 11/18/2010   HCT 39.9 11/18/2010   PLT 241.0 11/18/2010   GLUCOSE 98 11/18/2010   CHOL 225* 02/16/2010   TRIG 171.0* 02/16/2010   HDL 53.40 02/16/2010   LDLDIRECT 147.5 02/16/2010   LDLCALC  Value: 109         02/11/2009   ALT 14 11/18/2010   AST 18 11/18/2010   NA 140 11/18/2010   K 4.7 11/18/2010   CL 106 11/18/2010   CREATININE 0.8 11/18/2010   BUN 14 11/18/2010   CO2 29 11/18/2010   TSH 0.50 11/18/2010   INR 0.95 03/29/2010   HGBA1C  Value: 5.4  02/10/2009   EKG - declined today      Assessment & Plan:   CPX - v70.0 Patient has been counseled on age-appropriate routine health concerns for screening and prevention. These are reviewed and up-to-date. Immunizations are up-to-date or declined. Labs ordered and will be reviewed.  Acute bronchitis, associated bronchospasm - exacerbated by recent viral illness and resumed smoking with ongoing social stressors at home. Treat with Levaquin daily x10 days, prednisone pack and albuterol inhaler. Patient advised to consider cessation of smoking to the health  risk  Anxiety- exacerbated by social and family stressors - encouraged use of alternative medication to control same rather than smoking - prescription for Xanax provided, patient to call symptoms worse or unimproved. Offered but declined counseling - supportive care provided

## 2011-02-24 ENCOUNTER — Ambulatory Visit (INDEPENDENT_AMBULATORY_CARE_PROVIDER_SITE_OTHER)
Admission: RE | Admit: 2011-02-24 | Discharge: 2011-02-24 | Disposition: A | Discharge status: Home / Self Care | Payer: Managed Care, Other (non HMO) | Source: Ambulatory Visit | Attending: Internal Medicine | Admitting: Internal Medicine

## 2011-02-24 DIAGNOSIS — J209 Acute bronchitis, unspecified: Secondary | ICD-10-CM

## 2011-02-27 ENCOUNTER — Ambulatory Visit (INDEPENDENT_AMBULATORY_CARE_PROVIDER_SITE_OTHER): Payer: Managed Care, Other (non HMO) | Admitting: Internal Medicine

## 2011-02-27 ENCOUNTER — Ambulatory Visit (INDEPENDENT_AMBULATORY_CARE_PROVIDER_SITE_OTHER): Payer: Managed Care, Other (non HMO)

## 2011-02-27 ENCOUNTER — Encounter: Payer: Self-pay | Admitting: Internal Medicine

## 2011-02-27 ENCOUNTER — Other Ambulatory Visit: Payer: Self-pay | Admitting: *Deleted

## 2011-02-27 DIAGNOSIS — I252 Old myocardial infarction: Secondary | ICD-10-CM

## 2011-02-27 DIAGNOSIS — M545 Low back pain, unspecified: Secondary | ICD-10-CM

## 2011-02-27 DIAGNOSIS — F17200 Nicotine dependence, unspecified, uncomplicated: Secondary | ICD-10-CM

## 2011-02-27 DIAGNOSIS — F17210 Nicotine dependence, cigarettes, uncomplicated: Secondary | ICD-10-CM | POA: Insufficient documentation

## 2011-02-27 DIAGNOSIS — K5080 Crohn's disease of both small and large intestine without complications: Secondary | ICD-10-CM

## 2011-02-27 DIAGNOSIS — Z23 Encounter for immunization: Secondary | ICD-10-CM

## 2011-02-27 MED ORDER — HYDROCODONE-ACETAMINOPHEN 5-325 MG PO TABS: 0.5000 | ORAL_TABLET | Freq: Four times a day (QID) | ORAL | Status: DC | PRN

## 2011-02-27 NOTE — Assessment & Plan Note (Addendum)
She is not ready to pursue therapy as she is not sure will benefit her. We discussed  the potential long-term effects of strictures, fistula and inflammatory changes associated with Crohn's disease. She has a fair amount of stress in her life right now, she is also being treated for bronchitis.  We have agreed that she will call after the first of the year to consider a repeat trial of Entocort. She had problems with nausea and vomiting at that time when she tried this and 2011. She thinks it may have been related to other medications and things going on at that time I think she might be right. So, she is willing to try that, we could see what response she might have, and she could monitor her GI symptoms to see if it made a difference. Long-term, given small bowel Crohn's disease immunomodulators therapy would seem to make the most sense, versus biologic. Though I would be hard pressed to prescribe a biologic based upon her clinical status.
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
## 2011-02-27 NOTE — Telephone Encounter (Signed)
Faxed script back to pharmacy...02/27/11@4 :48pm/LMB

## 2011-02-27 NOTE — Patient Instructions (Signed)
Please continue to try to quit smoking as we discussed. Call back in January or so, when you are ready to try Entocort again. See me routinely in 6 months. Gatha Mayer, MD, [facility]

## 2011-03-20 ENCOUNTER — Other Ambulatory Visit: Payer: Self-pay | Admitting: *Deleted

## 2011-03-20 MED ORDER — PROMETHAZINE-DM 6.25-15 MG/5ML PO SYRP: 5.0000 mL | ORAL_SOLUTION | Freq: Four times a day (QID) | ORAL | Status: AC | PRN

## 2011-03-20 NOTE — Telephone Encounter (Signed)
Pt states at last visit she discuss with md will not have insurance after November was taking hydromet prn for cough without insurance its over $150 want to know can she try promethazine DM cough syrup can get cheaper at [facility]....03/20/11@12 :04pm/LMB

## 2011-03-20 NOTE — Telephone Encounter (Signed)
Ok to try as requested - need to enter pharmacy change - thanks

## 2011-03-20 NOTE — Telephone Encounter (Signed)
Notified pt cough syrup sent to [facility]...03/20/11@1 :54pm/LMB

## 2011-07-31 ENCOUNTER — Other Ambulatory Visit: Payer: Self-pay | Admitting: *Deleted

## 2011-07-31 DIAGNOSIS — M545 Low back pain, unspecified: Secondary | ICD-10-CM

## 2011-07-31 MED ORDER — HYDROCODONE-ACETAMINOPHEN 5-325 MG PO TABS: 0.5000 | ORAL_TABLET | Freq: Four times a day (QID) | ORAL | Status: DC | PRN

## 2011-07-31 NOTE — Telephone Encounter (Signed)
Faxed script back to [facility]... 07/31/11@3 :06pm/LMB

## 2012-06-22 ENCOUNTER — Emergency Department (HOSPITAL_COMMUNITY): Payer: Self-pay

## 2012-06-22 ENCOUNTER — Emergency Department (HOSPITAL_COMMUNITY)
Admission: EM | Admit: 2012-06-22 | Discharge: 2012-06-22 | Disposition: A | Discharge status: Home / Self Care | Payer: Self-pay | Attending: Emergency Medicine | Admitting: Emergency Medicine

## 2012-06-22 ENCOUNTER — Encounter (HOSPITAL_COMMUNITY): Payer: Self-pay | Admitting: *Deleted

## 2012-06-22 DIAGNOSIS — Z79899 Other long term (current) drug therapy: Secondary | ICD-10-CM | POA: Insufficient documentation

## 2012-06-22 DIAGNOSIS — Z8679 Personal history of other diseases of the circulatory system: Secondary | ICD-10-CM | POA: Insufficient documentation

## 2012-06-22 DIAGNOSIS — Z90710 Acquired absence of both cervix and uterus: Secondary | ICD-10-CM | POA: Insufficient documentation

## 2012-06-22 DIAGNOSIS — F17200 Nicotine dependence, unspecified, uncomplicated: Secondary | ICD-10-CM | POA: Insufficient documentation

## 2012-06-22 DIAGNOSIS — Z8719 Personal history of other diseases of the digestive system: Secondary | ICD-10-CM | POA: Insufficient documentation

## 2012-06-22 DIAGNOSIS — Z862 Personal history of diseases of the blood and blood-forming organs and certain disorders involving the immune mechanism: Secondary | ICD-10-CM | POA: Insufficient documentation

## 2012-06-22 DIAGNOSIS — R21 Rash and other nonspecific skin eruption: Secondary | ICD-10-CM | POA: Insufficient documentation

## 2012-06-22 DIAGNOSIS — Z8639 Personal history of other endocrine, nutritional and metabolic disease: Secondary | ICD-10-CM | POA: Insufficient documentation

## 2012-06-22 DIAGNOSIS — Z8739 Personal history of other diseases of the musculoskeletal system and connective tissue: Secondary | ICD-10-CM | POA: Insufficient documentation

## 2012-06-22 DIAGNOSIS — I10 Essential (primary) hypertension: Secondary | ICD-10-CM | POA: Insufficient documentation

## 2012-06-22 DIAGNOSIS — M549 Dorsalgia, unspecified: Secondary | ICD-10-CM | POA: Insufficient documentation

## 2012-06-22 DIAGNOSIS — Z85038 Personal history of other malignant neoplasm of large intestine: Secondary | ICD-10-CM | POA: Insufficient documentation

## 2012-06-22 DIAGNOSIS — Z9089 Acquired absence of other organs: Secondary | ICD-10-CM | POA: Insufficient documentation

## 2012-06-22 DIAGNOSIS — I509 Heart failure, unspecified: Secondary | ICD-10-CM | POA: Insufficient documentation

## 2012-06-22 DIAGNOSIS — J40 Bronchitis, not specified as acute or chronic: Secondary | ICD-10-CM

## 2012-06-22 DIAGNOSIS — I252 Old myocardial infarction: Secondary | ICD-10-CM | POA: Insufficient documentation

## 2012-06-22 DIAGNOSIS — J209 Acute bronchitis, unspecified: Secondary | ICD-10-CM | POA: Insufficient documentation

## 2012-06-22 DIAGNOSIS — Z87448 Personal history of other diseases of urinary system: Secondary | ICD-10-CM | POA: Insufficient documentation

## 2012-06-22 LAB — URINALYSIS, ROUTINE W REFLEX MICROSCOPIC
Bilirubin Urine: NEGATIVE
Glucose, UA: NEGATIVE mg/dL
Ketones, ur: NEGATIVE mg/dL
Leukocytes, UA: NEGATIVE
Nitrite: NEGATIVE
Protein, ur: 30 mg/dL — AB
Specific Gravity, Urine: 1.017 (ref 1.005–1.030)
Urobilinogen, UA: 0.2 mg/dL (ref 0.0–1.0)
pH: 5.5 (ref 5.0–8.0)

## 2012-06-22 LAB — CBC WITH DIFFERENTIAL/PLATELET
Basophils Absolute: 0 10*3/uL (ref 0.0–0.1)
Basophils Relative: 0 % (ref 0–1)
Eosinophils Absolute: 0 10*3/uL (ref 0.0–0.7)
Eosinophils Relative: 0 % (ref 0–5)
HCT: 37.6 % (ref 36.0–46.0)
Hemoglobin: 12.7 g/dL (ref 12.0–15.0)
Lymphocytes Relative: 19 % (ref 12–46)
Lymphs Abs: 1.8 10*3/uL (ref 0.7–4.0)
MCH: 30.2 pg (ref 26.0–34.0)
MCHC: 33.8 g/dL (ref 30.0–36.0)
MCV: 89.5 fL (ref 78.0–100.0)
Monocytes Absolute: 1 10*3/uL (ref 0.1–1.0)
Monocytes Relative: 11 % (ref 3–12)
Neutro Abs: 6.6 10*3/uL (ref 1.7–7.7)
Neutrophils Relative %: 70 % (ref 43–77)
Platelets: 194 10*3/uL (ref 150–400)
RBC: 4.2 MIL/uL (ref 3.87–5.11)
RDW: 12.9 % (ref 11.5–15.5)
WBC: 9.5 10*3/uL (ref 4.0–10.5)

## 2012-06-22 LAB — COMPREHENSIVE METABOLIC PANEL
ALT: 31 U/L (ref 0–35)
AST: 34 U/L (ref 0–37)
Albumin: 3.4 g/dL — ABNORMAL LOW (ref 3.5–5.2)
Alkaline Phosphatase: 81 U/L (ref 39–117)
BUN: 8 mg/dL (ref 6–23)
CO2: 24 mEq/L (ref 19–32)
Calcium: 8.9 mg/dL (ref 8.4–10.5)
Chloride: 98 mEq/L (ref 96–112)
Creatinine, Ser: 0.82 mg/dL (ref 0.50–1.10)
GFR calc Af Amer: 87 mL/min — ABNORMAL LOW (ref >90)
GFR calc non Af Amer: 75 mL/min — ABNORMAL LOW (ref >90)
Glucose, Bld: 119 mg/dL — ABNORMAL HIGH (ref 70–99)
Potassium: 3.7 mEq/L (ref 3.5–5.1)
Sodium: 133 mEq/L — ABNORMAL LOW (ref 135–145)
Total Bilirubin: 0.3 mg/dL (ref 0.3–1.2)
Total Protein: 7.2 g/dL (ref 6.0–8.3)

## 2012-06-22 LAB — URINE MICROSCOPIC-ADD ON

## 2012-06-22 MED ORDER — SODIUM CHLORIDE 0.9 % IV SOLN
Freq: Once | INTRAVENOUS | Status: AC
  Administered 2012-06-22: 19:00:00 via INTRAVENOUS

## 2012-06-22 MED ORDER — HYDROMORPHONE HCL PF 1 MG/ML IJ SOLN
0.5000 mg | Freq: Once | INTRAMUSCULAR | Status: AC
  Administered 2012-06-22: 0.5 mg via INTRAVENOUS
  Filled 2012-06-22: qty 1

## 2012-06-22 MED ORDER — HYDROMORPHONE HCL PF 1 MG/ML IJ SOLN
INTRAMUSCULAR | Status: AC
  Filled 2012-06-22: qty 1

## 2012-06-22 MED ORDER — HYDROCODONE-ACETAMINOPHEN 5-325 MG PO TABS: 1.0000 | ORAL_TABLET | Freq: Four times a day (QID) | ORAL | Status: DC | PRN

## 2012-06-22 MED ORDER — ONDANSETRON HCL 4 MG/2ML IJ SOLN
INTRAMUSCULAR | Status: AC
  Filled 2012-06-22: qty 2

## 2012-06-22 MED ORDER — ONDANSETRON HCL 4 MG/2ML IJ SOLN
4.0000 mg | Freq: Once | INTRAMUSCULAR | Status: AC
  Administered 2012-06-22: 4 mg via INTRAVENOUS

## 2012-06-22 MED ORDER — AZITHROMYCIN 250 MG PO TABS
500.0000 mg | ORAL_TABLET | Freq: Once | ORAL | Status: AC
  Administered 2012-06-22: 500 mg via ORAL
  Filled 2012-06-22: qty 2

## 2012-06-22 MED ORDER — HYDROMORPHONE HCL PF 1 MG/ML IJ SOLN
0.5000 mg | Freq: Once | INTRAMUSCULAR | Status: AC
  Administered 2012-06-22: 0.5 mg via INTRAVENOUS

## 2012-06-22 MED ORDER — SODIUM CHLORIDE 0.9 % IV BOLUS (SEPSIS)
1000.0000 mL | Freq: Once | INTRAVENOUS | Status: AC
  Administered 2012-06-22: 1000 mL via INTRAVENOUS

## 2012-06-22 MED ORDER — AZITHROMYCIN 250 MG PO TABS: ORAL_TABLET | ORAL | Status: DC

## 2012-06-22 NOTE — ED Notes (Signed)
Patient transported to CT 

## 2012-06-22 NOTE — ED Notes (Signed)
Pt continues to have flank pain, Dr. Roderic Palau notified of same and orders given

## 2012-06-22 NOTE — ED Provider Notes (Signed)
History     CSN: [id number]  Arrival date & time 06/22/12  1632   First MD Initiated Contact with Patient 06/22/12 1650      Chief Complaint  Patient presents with  . Flank Pain    (Consider location/radiation/quality/duration/timing/severity/associated sxs/prior treatment) Patient is a 62 y.o. female presenting with cough. The history is provided by the patient (pt complains of cough and back pain). No language interpreter was used.  Cough Cough characteristics:  Productive Sputum characteristics:  Nondescript Severity:  Moderate Onset quality:  Sudden Timing:  Constant Progression:  Waxing and waning Chronicity:  New Smoker: no   Context: not animal exposure   Associated symptoms: no chest pain, no eye discharge, no headaches and no rash     Past Medical History  Diagnosis Date  . Takotsubo syndrome 12/2008  . ANEMIA   . OSTEOARTHRITIS   . URINARY INCONTINENCE   . ADENOCARCINOMA, COLON, HX OF 03/2009  . BACK PAIN, LUMBAR   . HYPERTENSION   . HYPERLIPIDEMIA   . GERD   . HEART ATTACK 12/2008    takotsbo stress induced  . Crohn's  02/2010    ileal ulcers, intol of entercort  . Microscopic hematuria     chronic  . CHF (congestive heart failure)   . History of transfusion of whole blood     Past Surgical History  Procedure Laterality Date  . Cesarean section      x 3  . Abdominal hysterectomy  1994  . Colon surgery  03/2009    hemicolectomy colon cancer  . Cholecystectomy  1995  . Tonsillectomy  1952  . Appendectomy  1964  . Hand surgery  1991  . Knee surgery  1981    bilateral  . Right knee replacement  03/2010  . Upper gastrointestinal endoscopy  05/16/2010    normal  . Colonoscopy w/ biopsies and polypectomy  01/24/2011    (Crohn's)ileitis, internal hemorrhoids  . Breast surgery      Family History  Problem Relation Age of Onset  . Throat cancer Mother   . Colon cancer Father   . Hypertension Father   . Heart disease Father   . Kidney disease  Father   . Colon cancer Sister   . Arthritis Other     Parent, other relative    History  Substance Use Topics  . Smoking status: Current Every Day Smoker    Types: Cigarettes  . Smokeless tobacco: Never Used     Comment: Married, lives with spouse. Originally from [place]. 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 Alcohol Use: No    OB History   Grav Para Term Preterm Abortions TAB SAB Ect Mult Living                  Review of Systems  Constitutional: Negative for fatigue.  HENT: Negative for congestion, sinus pressure and ear discharge.   Eyes: Negative for discharge.  Respiratory: Positive for cough.   Cardiovascular: Negative for chest pain.  Gastrointestinal: Negative for abdominal pain and diarrhea.  Genitourinary: Positive for flank pain. Negative for frequency and hematuria.  Musculoskeletal: Negative for back pain.  Skin: Negative for rash.  Neurological: Negative for seizures and headaches.  Psychiatric/Behavioral: Negative for hallucinations.    Allergies  Morphine  Home Medications   Current Outpatient Rx  Name  Route  Sig  Dispense  Refill  . acetaminophen (TYLENOL) 500 MG tablet  Oral   Take 1,000 mg by mouth every 6 (six) hours as needed for pain.         . cyclobenzaprine (FLEXERIL) 10 MG tablet               . metoprolol (TOPROL-XL) 50 MG 24 hr tablet   Oral   Take 50 mg by mouth daily.           [name] azithromycin (ZITHROMAX) 250 MG tablet      Take one pill every day.  Start tomorrow   4 tablet   0   . HYDROcodone-acetaminophen (NORCO/VICODIN) 5-325 MG per tablet   Oral   Take 1 tablet by mouth every 6 (six) hours as needed for pain.   20 tablet   0     BP 113/58  Pulse 93  Temp(Src) 100.6 F (38.1 C) (Oral)  Resp 18  SpO2 94%  Physical Exam  Constitutional: She is oriented to person, place, and time. She appears well-developed.  HENT:  Head: Normocephalic and  atraumatic.  Eyes: Conjunctivae and EOM are normal. No scleral icterus.  Neck: Neck supple. No thyromegaly present.  Cardiovascular: Normal rate and regular rhythm.  Exam reveals no gallop and no friction rub.   No murmur heard. Pulmonary/Chest: No stridor. She has no wheezes. She has no rales. She exhibits no tenderness.  Abdominal: She exhibits no distension. There is no tenderness. There is no rebound.  Musculoskeletal: Normal range of motion. She exhibits no edema.  Lymphadenopathy:    She has no cervical adenopathy.  Neurological: She is oriented to person, place, and time. Coordination normal.  Skin: No rash noted. No erythema.  Psychiatric: She has a normal mood and affect. Her behavior is normal.    ED Course  Procedures (including critical care time)  Labs Reviewed  COMPREHENSIVE METABOLIC PANEL - Abnormal; Notable for the following:    Sodium 133 (*)    Glucose, Bld 119 (*)    Albumin 3.4 (*)    GFR calc non Af Amer 75 (*)    GFR calc Af Amer 87 (*)    All other components within normal limits  URINALYSIS, ROUTINE W REFLEX MICROSCOPIC - Abnormal; Notable for the following:    APPearance CLOUDY (*)    Hgb urine dipstick LARGE (*)    Protein, ur 30 (*)    All other components within normal limits  CBC WITH DIFFERENTIAL  URINE MICROSCOPIC-ADD ON   Ct Abdomen Pelvis Wo Contrast  06/22/2012  *RADIOLOGY REPORT*  Clinical Data: Bilateral flank pain.  CT ABDOMEN AND PELVIS WITHOUT CONTRAST  Technique:  Multidetector CT imaging of the abdomen and pelvis was performed following the standard protocol without intravenous contrast.  Comparison: CT abdomen 05/07/2010  Findings: Negative for renal calculi.  No urinary tract calcification.  Negative for renal obstruction.  Urinary bladder is normal.  Lung bases are clear.  Prior cholecystectomy.  Liver spleen and pancreas are normal.  Negative for bowel obstruction or bowel thickening.  Grade 1 anterior slip L4-5 with lumbar degenerative  change.  Negative for fracture.  IMPRESSION: No acute abnormality.  No urinary tract calculi.   Original Report Authenticated By: Carl Best, M.D.    Dg Chest 2 View  06/22/2012  *RADIOLOGY REPORT*  Clinical Data: Fever  CHEST - 2 VIEW  Comparison: 02/24/2011  Findings: Cardiomediastinal silhouette is stable.  Mild hyperinflation again noted.  Central mild bronchitic changes. There is streaky airspace disease left perihilar and lingula suspicious for evolving infiltrate/pneumonia.  Follow-up to resolution after treatment is recommended.  IMPRESSION:  Mild hyperinflation again noted.  Central mild bronchitic changes. There is streaky airspace disease left perihilar and lingula suspicious for evolving infiltrate/pneumonia.  Follow-up to resolution after treatment is recommended.   Original Report Authenticated By: Lahoma Crocker, M.D.      1. Bronchitis       MDM  Bronchitis possible early pneumonia        Maudry Diego, MD 06/22/12 2010

## 2012-06-22 NOTE — ED Notes (Addendum)
Pt comes from home with c/o left flank pain radiating to abdomen x2 days ago, worsening today.  Pt endorses nausea, but denies vomiting.  Pt has had a fever of 103 at home.  Pt has had increased sensation to urinate, but produces very little output.  Pt states she had some pain with urination and states she has had blood in her urine for "35 years."

## 2012-08-01 ENCOUNTER — Telehealth: Payer: Self-pay

## 2012-08-01 DIAGNOSIS — M25561 Pain in right knee: Secondary | ICD-10-CM

## 2012-08-01 NOTE — Telephone Encounter (Signed)
Pt called stating that she has been a previous patient of [facility] but has decided that she no longer wants her knee pain to managed by this group and is requesting VAL suggest another local Ortho, please advise. Pt has OV scheduled with VAL 06/30 but did not want to wait until appt to get recommendation.

## 2012-08-01 NOTE — Telephone Encounter (Signed)
I have placed a referral to [facility] - [facility] will call regarding same

## 2012-08-28 ENCOUNTER — Encounter: Payer: Self-pay | Admitting: Internal Medicine

## 2012-08-28 ENCOUNTER — Ambulatory Visit (INDEPENDENT_AMBULATORY_CARE_PROVIDER_SITE_OTHER): Payer: BC Managed Care – PPO | Admitting: Internal Medicine

## 2012-08-28 VITALS — BP 140/84 | HR 76 | Ht 62.0 in | Wt 159.0 lb

## 2012-08-28 DIAGNOSIS — K5000 Crohn's disease of small intestine without complications: Secondary | ICD-10-CM

## 2012-08-28 MED ORDER — NA SULFATE-K SULFATE-MG SULF 17.5-3.13-1.6 GM/177ML PO SOLN: ORAL | Status: DC

## 2012-08-28 NOTE — Progress Notes (Signed)
  Subjective:    Patient ID: Erica [name], female    DOB: 07/07/1950, 62 y.o.   MRN: [id number]  HPI Here for f/u Crohn's ileitis. She is also s/p right hemicolectomy for other reasons. 3 stools a day - avoids going out to eat usually. 2-3 episodes of urge incontinence in past year  Rectal pain occasionally - 3 x in last year - spontaneous x 15 mins No rectal bleeding  Medications, allergies, past medical history, past surgical history, family history and social history are reviewed and updated in the EMR.  Review of Systems Pneumonia w/ ED visit    No recent fever Still weak and SOB Says she is smoking less than ever Wants earlier PCP appointment Objective:   Physical Exam General:  NAD Eyes:   anicteric Lungs:  Coarse BS Heart:  S1S2 no rubs, murmurs or gallops Abdomen:  Soft with some RLQ fullness and mildly tender RLQ, BS+ Ext:   no edema   Data Reviewed:   06/2012 CT abd/pelvis Labs and ED visit    Assessment & Plan:  Crohn's ileitis, without complications  1. More tender in RLQ than before - still not on therapy  2. Will reassess with colonoscopy  The risks and benefits as well as alternatives of endoscopic procedure(s) have been discussed and reviewed. All questions answered. The patient agrees to proceed.

## 2012-08-28 NOTE — Patient Instructions (Addendum)

## 2012-09-05 ENCOUNTER — Telehealth: Payer: Self-pay | Admitting: *Deleted

## 2012-09-05 NOTE — Telephone Encounter (Signed)
Left msg on triage requesting call back wth appt. Per chart appt has already been made for 09/30/12...lmb

## 2012-09-17 ENCOUNTER — Encounter: Payer: Self-pay | Admitting: Internal Medicine

## 2012-09-17 ENCOUNTER — Ambulatory Visit (AMBULATORY_SURGERY_CENTER): Payer: BC Managed Care – PPO | Admitting: Internal Medicine

## 2012-09-17 VITALS — BP 138/64 | HR 68 | Temp 97.6°F | Resp 14 | Ht 62.0 in | Wt 159.0 lb

## 2012-09-17 DIAGNOSIS — Z85038 Personal history of other malignant neoplasm of large intestine: Secondary | ICD-10-CM

## 2012-09-17 DIAGNOSIS — D126 Benign neoplasm of colon, unspecified: Secondary | ICD-10-CM

## 2012-09-17 DIAGNOSIS — Z86010 Personal history of colonic polyps: Secondary | ICD-10-CM

## 2012-09-17 DIAGNOSIS — K50019 Crohn's disease of small intestine with unspecified complications: Secondary | ICD-10-CM

## 2012-09-17 MED ORDER — MESALAMINE 800 MG PO TBEC: 1600.0000 mg | DELAYED_RELEASE_TABLET | Freq: Three times a day (TID) | ORAL | Status: DC

## 2012-09-17 MED ORDER — SODIUM CHLORIDE 0.9 % IV SOLN: 500.0000 mL | INTRAVENOUS | Status: DC

## 2012-09-17 NOTE — Op Note (Signed)
[facility]  [address] [place], [address]   COLONOSCOPY PROCEDURE REPORT  PATIENT: Erica [name], Erica [name]  MR#: [id number] BIRTHDATE: 1950/11/19 , 10  yrs. old GENDER: Female ENDOSCOPIST: Gatha Mayer, MD, [facility] PROCEDURE DATE:  09/17/2012 PROCEDURE:   Colonoscopy with biopsy ASA CLASS:   Class II INDICATIONS:Screening and surveillance,personal history of colonic polyps, High risk patient with personal history of colon cancer, and Patient's immediate family history of colon cancer. MEDICATIONS: propofol (Diprivan) 220m IV, MAC sedation, administered by CRNA, and These medications were titrated to patient response per physician's verbal order  DESCRIPTION OF PROCEDURE:   After the risks benefits and alternatives of the procedure were thoroughly explained, informed consent was obtained.  A digital rectal exam revealed no abnormalities of the rectum.   The LB [id number] endoscope was introduced through the anus and advanced to the terminal ileum which was intubated for a short distance. No adverse events experienced.   The quality of the prep was excellent using Suprep The instrument was then slowly withdrawn as the colon was fully examined.      COLON FINDINGS: Moderate ileitis was found in the distal ileum. About 10 cm involvement from ileo-colonic anastomosis to then normal.  A diminutive sessile polyp was found in the sigmoid colon. A polypectomy was performed with cold forceps.  The resection was complete and the polyp tissue was completely retrieved.   Mild diverticulosis was noted in the sigmoid colon.   The colon mucosa was otherwise normal.   Retroflexed views revealed no abnormalities. The time to cecum=2 minutes 52 seconds.  Withdrawal time=10 minutes 18 seconds.  The scope was withdrawn and the procedure completed. COMPLICATIONS: There were no complications.  ENDOSCOPIC IMPRESSION: 1.   Moderate ileitis was found in the distal ileum -  slightly worse than 2012 2.   Diminutive sessile polyp was found in the sigmoid colon; polypectomy was performed with cold forceps 3.   Mild diverticulosis was noted in the sigmoid colon 4.   The colon mucosa was otherwise normal - excellent prep 5.   There was evidence of a prior ileocolonic surgical anastomosis s/p right henmi-colectomy for colon cancer 2010       RECOMMENDATIONS: 1.  Timing of repeat colonoscopy will be determined by pathology findings. 2.  One of your biggest health concerns is your smoking.  This increases your risk for most cancers and serious cardiovascular diseases such as strokes, heart attacks and also makes Crohn's disease worse. Quitting smoking can lead to healing of Crohn's disease.  You should try your best to stop.  If you need assistence, please contact your PCP or [facility]ess[phone] or [facility](1-800-QUIT-NOW). 3. Moviprep next time per patient request 4. Start Asacol HD 1.6 g tid and see me in 6 months (Dec-Jan)   eSigned:  CGatha Mayer MD, FHen[facility]' [facility] - [facility]06/17/2014 4:56 PM   cc: The Patient   PATIENT NAME:  Erica [name], LenartMR#: [id number]

## 2012-09-17 NOTE — Progress Notes (Signed)
Called to room to assist during endoscopic procedure.  Patient ID and intended procedure confirmed with present staff. Received instructions for my participation in the procedure from the performing physician.  

## 2012-09-17 NOTE — Progress Notes (Signed)
Patient did not experience any of the following events: a burn prior to discharge; a fall within the facility; wrong site/side/patient/procedure/implant event; or a hospital transfer or hospital admission upon discharge from the facility. (G8907) Patient did not have preoperative order for IV antibiotic SSI prophylaxis. (G8918)  

## 2012-09-17 NOTE — Progress Notes (Signed)
Lidocaine-40mg IV prior to Propofol InductionPropofol given over incremental dosages 

## 2012-09-17 NOTE — Patient Instructions (Addendum)
The Crohn's looks a little worse. It could get better if you quit smoking.  I do want you to try a new medication called Asacol HD which is usually well tolerated. Prescription sent to your pharmacy.  I removed 1 tiny polyp. I will let you know pathology results and when to have another routine colonoscopy by mail. I would like to see you in the office in 6 months.  I appreciate the opportunity to care for you. Gatha Mayer, MD, FACG  YOU HAD AN ENDOSCOPIC PROCEDURE TODAY AT [facility]: Refer to the procedure report that was given to you for any specific questions about what was found during the examination.  If the procedure report does not answer your questions, please call your gastroenterologist to clarify.  If you requested that your care partner not be given the details of your procedure findings, then the procedure report has been included in a sealed envelope for you to review at your convenience later.  YOU SHOULD EXPECT: Some feelings of bloating in the abdomen. Passage of more gas than usual.  Walking can help get rid of the air that was put into your GI tract during the procedure and reduce the bloating. If you had a lower endoscopy (such as a colonoscopy or flexible sigmoidoscopy) you may notice spotting of blood in your stool or on the toilet paper. If you underwent a bowel prep for your procedure, then you may not have a normal bowel movement for a few days.  DIET: Your first meal following the procedure should be a light meal and then it is ok to progress to your normal diet.  A half-sandwich or bowl of soup is an example of a good first meal.  Heavy or fried foods are harder to digest and may make you feel nauseous or bloated.  Likewise meals heavy in dairy and vegetables can cause extra gas to form and this can also increase the bloating.  Drink plenty of fluids but you should avoid alcoholic beverages for 24 hours.  ACTIVITY: Your care partner should take you  home directly after the procedure.  You should plan to take it easy, moving slowly for the rest of the day.  You can resume normal activity the day after the procedure however you should NOT DRIVE or use heavy machinery for 24 hours (because of the sedation medicines used during the test).    SYMPTOMS TO REPORT IMMEDIATELY: A gastroenterologist can be reached at any hour.  During normal business hours, 8:30 AM to 5:00 PM Monday through Friday, call [phone].  After hours and on weekends, please call the GI answering service at [phone] who will take a message and have the physician on call contact you.   Following lower endoscopy (colonoscopy or flexible sigmoidoscopy):  Excessive amounts of blood in the stool  Significant tenderness or worsening of abdominal pains  Swelling of the abdomen that is new, acute  Fever of 100F or higher  FOLLOW UP: If any biopsies were taken you will be contacted by phone or by letter within the next 1-3 weeks.  Call your gastroenterologist if you have not heard about the biopsies in 3 weeks.  Our staff will call the home number listed on your records the next business day following your procedure to check on you and address any questions or concerns that you may have at that time regarding the information given to you following your procedure. This is a courtesy call and so  if there is no answer at the home number and we have not heard from you through the emergency physician on call, we will assume that you have returned to your regular daily activities without incident.  SIGNATURES/CONFIDENTIALITY: You and/or your care partner have signed paperwork which will be entered into your electronic medical record.  These signatures attest to the fact that that the information above on your After Visit Summary has been reviewed and is understood.  Full responsibility of the confidentiality of this discharge information lies with you and/or your  care-partner.  Diverticulosis, polys-handoust given  Repeat colonoscopy will be determined by pathology  Start Asacol HD 1.6 g three times a day   Call office for appointment in 6 months

## 2012-09-18 ENCOUNTER — Telehealth: Payer: Self-pay | Admitting: *Deleted

## 2012-09-18 NOTE — Telephone Encounter (Signed)
  Follow up Call-  Call back number 09/17/2012 01/24/2011  Post procedure Call Back phone  # [phone]  Permission to leave phone message Yes -     Patient questions:  Do you have a fever, pain , or abdominal swelling? no Pain Score  0 *  Have you tolerated food without any problems? yes  Have you been able to return to your normal activities? yes  Do you have any questions about your discharge instructions: Diet   no Medications  no Follow up visit  no  Do you have questions or concerns about your Care? no  Actions: * If pain score is 4 or above: No action needed, pain <4.

## 2012-09-19 ENCOUNTER — Telehealth: Payer: Self-pay | Admitting: Internal Medicine

## 2012-09-19 NOTE — Telephone Encounter (Signed)
Spoke to patient , the Asacol is going to cost her $172.00 co-pay for one months worth.  She cannot afford this.  Please advise Sir.  She is aware you are out of the office until Monday.

## 2012-09-20 NOTE — Telephone Encounter (Signed)
Please try to find out what is on her formulary, what the Asacol would cost (how much is too much) and do we have any coupons to help with Asacol or Lialda

## 2012-09-20 NOTE — Telephone Encounter (Signed)
Called patient and left a message for patient to return my call.

## 2012-09-23 NOTE — Telephone Encounter (Signed)
Spoke to patient and her husband is going to come and pick up a coupon card I located for her.  Says no more than $30.00 per month.  Hopefully this will work , she will call the drug store and let me know if I need to send in rx again since she didn't pick it up due to  $$.

## 2012-09-24 ENCOUNTER — Encounter: Payer: Self-pay | Admitting: Internal Medicine

## 2012-09-24 NOTE — Progress Notes (Signed)
Quick Note:  Hyperplastic polyp Repeat colon 09/2015 (personal and family hx CRCA) ______

## 2012-09-30 ENCOUNTER — Other Ambulatory Visit (INDEPENDENT_AMBULATORY_CARE_PROVIDER_SITE_OTHER): Payer: BC Managed Care – PPO

## 2012-09-30 ENCOUNTER — Ambulatory Visit (INDEPENDENT_AMBULATORY_CARE_PROVIDER_SITE_OTHER): Payer: BC Managed Care – PPO | Admitting: Internal Medicine

## 2012-09-30 ENCOUNTER — Encounter: Payer: Self-pay | Admitting: Internal Medicine

## 2012-09-30 VITALS — BP 120/72 | HR 81 | Temp 98.3°F | Wt 159.0 lb

## 2012-09-30 DIAGNOSIS — M545 Low back pain, unspecified: Secondary | ICD-10-CM

## 2012-09-30 DIAGNOSIS — K5090 Crohn's disease, unspecified, without complications: Secondary | ICD-10-CM

## 2012-09-30 DIAGNOSIS — Z0000 Encounter for general adult medical examination without abnormal findings: Secondary | ICD-10-CM

## 2012-09-30 DIAGNOSIS — K50919 Crohn's disease, unspecified, with unspecified complications: Secondary | ICD-10-CM

## 2012-09-30 DIAGNOSIS — R3129 Other microscopic hematuria: Secondary | ICD-10-CM | POA: Insufficient documentation

## 2012-09-30 DIAGNOSIS — M25569 Pain in unspecified knee: Secondary | ICD-10-CM

## 2012-09-30 LAB — HEPATIC FUNCTION PANEL
ALT: 17 U/L (ref 0–35)
AST: 17 U/L (ref 0–37)
Albumin: 4.1 g/dL (ref 3.5–5.2)
Alkaline Phosphatase: 68 U/L (ref 39–117)
Bilirubin, Direct: 0.1 mg/dL (ref 0.0–0.3)
Total Bilirubin: 0.4 mg/dL (ref 0.3–1.2)
Total Protein: 7.1 g/dL (ref 6.0–8.3)

## 2012-09-30 LAB — CBC WITH DIFFERENTIAL/PLATELET
Basophils Absolute: 0.1 10*3/uL (ref 0.0–0.1)
Basophils Relative: 0.6 % (ref 0.0–3.0)
Eosinophils Absolute: 0.2 10*3/uL (ref 0.0–0.7)
Eosinophils Relative: 2.4 % (ref 0.0–5.0)
HCT: 40.8 % (ref 36.0–46.0)
Hemoglobin: 13.9 g/dL (ref 12.0–15.0)
Lymphocytes Relative: 35.3 % (ref 12.0–46.0)
Lymphs Abs: 3.5 10*3/uL (ref 0.7–4.0)
MCHC: 34.1 g/dL (ref 30.0–36.0)
MCV: 90.8 fl (ref 78.0–100.0)
Monocytes Absolute: 0.8 10*3/uL (ref 0.1–1.0)
Monocytes Relative: 8.6 % (ref 3.0–12.0)
Neutro Abs: 5.2 10*3/uL (ref 1.4–7.7)
Neutrophils Relative %: 53.1 % (ref 43.0–77.0)
Platelets: 271 10*3/uL (ref 150.0–400.0)
RBC: 4.49 Mil/uL (ref 3.87–5.11)
RDW: 13.1 % (ref 11.5–14.6)
WBC: 9.8 10*3/uL (ref 4.5–10.5)

## 2012-09-30 LAB — URINALYSIS, ROUTINE W REFLEX MICROSCOPIC
Ketones, ur: NEGATIVE
Leukocytes, UA: NEGATIVE
Nitrite: NEGATIVE
Specific Gravity, Urine: >=1.03 (ref 1.000–1.030)
Total Protein, Urine: NEGATIVE
Urine Glucose: NEGATIVE
Urobilinogen, UA: 0.2 (ref 0.0–1.0)
pH: 5.5 (ref 5.0–8.0)

## 2012-09-30 LAB — BASIC METABOLIC PANEL
BUN: 17 mg/dL (ref 6–23)
CO2: 28 mEq/L (ref 19–32)
Calcium: 9.7 mg/dL (ref 8.4–10.5)
Chloride: 105 mEq/L (ref 96–112)
Creatinine, Ser: 0.9 mg/dL (ref 0.4–1.2)
GFR: 65.63 mL/min (ref >60.00)
Glucose, Bld: 90 mg/dL (ref 70–99)
Potassium: 4.6 mEq/L (ref 3.5–5.1)
Sodium: 138 mEq/L (ref 135–145)

## 2012-09-30 LAB — LIPID PANEL
Cholesterol: 225 mg/dL — ABNORMAL HIGH (ref 0–200)
HDL: 54.4 mg/dL (ref >39.00)
Total CHOL/HDL Ratio: 4
Triglycerides: 238 mg/dL — ABNORMAL HIGH (ref 0.0–149.0)
VLDL: 47.6 mg/dL — ABNORMAL HIGH (ref 0.0–40.0)

## 2012-09-30 LAB — LDL CHOLESTEROL, DIRECT: Direct LDL: 138.5 mg/dL

## 2012-09-30 LAB — TSH: TSH: 0.55 u[IU]/mL (ref 0.35–5.50)

## 2012-09-30 MED ORDER — HYDROCODONE-ACETAMINOPHEN 5-325 MG PO TABS: 1.0000 | ORAL_TABLET | Freq: Four times a day (QID) | ORAL | Status: DC | PRN

## 2012-09-30 MED ORDER — CYCLOBENZAPRINE HCL 10 MG PO TABS: 10.0000 mg | ORAL_TABLET | Freq: Three times a day (TID) | ORAL | Status: DC | PRN

## 2012-09-30 NOTE — Progress Notes (Signed)
Subjective:    Patient ID: Erica [name], female    DOB: 1951-03-12, 62 y.o.   MRN: [id number]  HPI patient is here today for annual physical. Patient feels well overall.  also reviewed chronic med issues:   colon cancer - dx 03/2009 and s/p r hemicolectomy for same - multiple SBO in past   crohns - dx 02/2010 due to ulcerations (also NSAID induced in DDx) - follows with GI for same - failed trial entercort -symptoms unchanged, continued diarrhea, abdominal pain episodes, nausea and vomiting - cont diffuse bloating - no unintentional weight loss  HTN - reports compliance with ongoing medical treatment and no changes in medication dose or frequency. denies adverse side effects related to current therapy. 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 Takotsubo syndrome 12/2008  . ANEMIA   . OSTEOARTHRITIS   . URINARY INCONTINENCE   . ADENOCARCINOMA, COLON, HX OF 03/2009  . BACK PAIN, LUMBAR   . HYPERTENSION   . HYPERLIPIDEMIA   . GERD   . HEART ATTACK 12/2008    takotsbo stress induced  . Crohn's  02/2010    ileal ulcers, intol of entercort  . Microscopic hematuria     chronic  . CHF (congestive heart failure)   . History of transfusion of whole blood   . Obstruction of intestine or colon     adhesions   Past Surgical History  Procedure Laterality Date  . Cesarean section      x 3  . Abdominal hysterectomy  1994  . Colon surgery  03/2009    hemicolectomy colon cancer  . Cholecystectomy  1995  . Tonsillectomy  1952  . Appendectomy  1964  . Hand surgery  1991  . Knee  surgery  1981    bilateral  . Right knee replacement  03/2010  . Upper gastrointestinal endoscopy  05/16/2010    normal  . Colonoscopy w/ biopsies and polypectomy  01/24/2011    (Crohn's)ileitis, internal hemorrhoids  . Breast surgery     Family History  Problem Relation Age of Onset  . Throat cancer Mother   . Colon cancer Father   . Hypertension Father   . Heart disease Father   . Kidney disease Father   . Colon cancer Sister   . Arthritis Other     Parent, other relative   History  Substance Use Topics  . Smoking status: Current Every Day Smoker -- 0.50 packs/day    Types: Cigarettes  . Smokeless tobacco: Never Used     Comment: Patient cut down to a pack instead of 2 //Married, lives with spouse. Originally from [place]. 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 Alcohol Use: No   Review of Systems  Constitutional: Negative for fever.  Respiratory: Negative for cough.   Musculoskeletal: Positive for back pain and gait problem.  Neurological: Negative for headaches.  Cardiovascular: Negative for chest pain or palpitations.  No other specific complaints in  a complete review of systems (except as listed in HPI above).      Objective:   Physical Exam  BP 120/72  Pulse 81  Temp(Src) 98.3 F (36.8 C) (Oral)  Wt 159 lb (72.122 kg)  BMI 29.07 kg/m2  SpO2 95%  Wt Readings from Last 3 Encounters:  09/30/12 159 lb (72.122 kg)  09/17/12 159 lb (72.122 kg)  08/28/12 159 lb (72.122 kg)   Constitutional: She appears well-developed and well-nourished. No distress.  HENT: Head: Normocephalic and atraumatic. Ears: B TMs ok, no erythema or effusion; Nose: Clear rhinorrhea. Mouth/Throat: Oropharynx is moderately red but clear and moist. No oropharyngeal exudate.  Eyes: Conjunctivae and EOM are normal. Pupils are equal, round, and reactive to light. No scleral icterus.  Neck: Normal range of motion. Neck supple. No JVD  present. No thyromegaly present.  Cardiovascular: Normal rate, regular rhythm and normal heart sounds.  No murmur heard. No BLE edema. Pulmonary/Chest: breath sounds with rhonchi bilaterally. No respiratory distress. She has expiratory wheezes.  Abdominal: Soft. Bowel sounds are normal. She exhibits no distension. There is no tenderness. no masses Musculoskeletal: B knee - boggy synovitis - tender to palpation over joint line; FROM and ligamentous function intact -Normal range of motion, no joint effusions. No gross deformities Neurological: She is alert and oriented to person, place, and time. No cranial nerve deficit. Coordination normal.  Skin: Skin is warm and dry. No rash noted. No erythema.  Psychiatric: She has a mildly anxious mood and affect. Her behavior is normal. Judgment and thought content normal.   Lab Results  Component Value Date   WBC 9.5 06/22/2012   HGB 12.7 06/22/2012   HCT 37.6 06/22/2012   PLT 194 06/22/2012   GLUCOSE 119* 06/22/2012   CHOL 200 02/22/2011   TRIG 180.0* 02/22/2011   HDL 63.10 02/22/2011   LDLDIRECT 147.5 02/16/2010   LDLCALC 101* 02/22/2011   ALT 31 06/22/2012   AST 34 06/22/2012   NA 133* 06/22/2012   K 3.7 06/22/2012   CL 98 06/22/2012   CREATININE 0.82 06/22/2012   BUN 8 06/22/2012   CO2 24 06/22/2012   TSH 0.45 02/22/2011   INR 0.95 03/29/2010   HGBA1C  Value: 5.4 (NOTE) The ADA recommends the following therapeutic goal for glycemic control related to Hgb A1c measurement: Goal of therapy: <6.5 Hgb A1c  Reference: American Diabetes Association: Clinical Practice Recommendations 2010, Diabetes Care, 2010, 33: (Suppl  1). 02/10/2009    EKG - declined today      Assessment & Plan:   CPX - v70.0 Patient has been counseled on age-appropriate routine health concerns for screening and prevention. These are reviewed and up-to-date. Immunizations are up-to-date or declined. Labs ordered and will be reviewed.  Osteoarthritis issues - bilateral knees,  lumbar spine - prior total R knee replacement unsuccessful, has been seen by orthopedics again and reports temporary improvement with steroid injection as needed. No plans to return orthopedics at this time. Likewise, declines evaluation by spine specialist as "nothing can be done anyway" (prior orthospine and neurosurgical evaluation reviewed) - okay to continue low-dose narcotic as needed - refill on hydrocodone and muscle relaxer provided today. Symptomatic care ongoing, reassurance provided

## 2012-09-30 NOTE — Patient Instructions (Signed)
It was good to see you today. Health Maintenance reviewed - all recommended immunizations and age-appropriate screenings are up-to-date. Test(s) ordered today. Your results will be released to [place] (or called to you) after review, usually within 72hours after test completion. If any changes need to be made, you will be notified at that same time. Medications reviewed and updated, no changes recommended at this time. Refill on medication(s) as discussed today. Please schedule followup in 3-4 months, call sooner if problems.  Health Maintenance, Females A healthy lifestyle and preventative care can promote health and wellness.  Maintain regular health, dental, and eye exams.  Eat a healthy diet. Foods like vegetables, fruits, whole grains, low-fat dairy products, and lean protein foods contain the nutrients you need without too many calories. Decrease your intake of foods high in solid fats, added sugars, and salt. Get information about a proper diet from your caregiver, if necessary.  Regular physical exercise is one of the most important things you can do for your health. Most adults should get at least 150 minutes of moderate-intensity exercise (any activity that increases your heart rate and causes you to sweat) each week. In addition, most adults need muscle-strengthening exercises on 2 or more days a week.   Maintain a healthy weight. The body mass index (BMI) is a screening tool to identify possible weight problems. It provides an estimate of body fat based on height and weight. Your caregiver can help determine your BMI, and can help you achieve or maintain a healthy weight. For adults 20 years and older:  A BMI below 18.5 is considered underweight.  A BMI of 18.5 to 24.9 is normal.  A BMI of 25 to 29.9 is considered overweight.  A BMI of 30 and above is considered obese.  Maintain normal blood lipids and cholesterol by exercising and minimizing your intake of saturated fat. Eat a  balanced diet with plenty of fruits and vegetables. Blood tests for lipids and cholesterol should begin at age 89 and be repeated every 5 years. If your lipid or cholesterol levels are high, you are over 50, or you are a high risk for heart disease, you may need your cholesterol levels checked more frequently.Ongoing high lipid and cholesterol levels should be treated with medicines if diet and exercise are not effective.  If you smoke, find out from your caregiver how to quit. If you do not use tobacco, do not start.  If you are pregnant, do not drink alcohol. If you are breastfeeding, be very cautious about drinking alcohol. If you are not pregnant and choose to drink alcohol, do not exceed 1 drink per day. One drink is considered to be 12 ounces (355 mL) of beer, 5 ounces (148 mL) of wine, or 1.5 ounces (44 mL) of liquor.  Avoid use of street drugs. Do not share needles with anyone. Ask for help if you need support or instructions about stopping the use of drugs.  High blood pressure causes heart disease and increases the risk of stroke. Blood pressure should be checked at least every 1 to 2 years. Ongoing high blood pressure should be treated with medicines, if weight loss and exercise are not effective.  If you are 83 to 63 years old, ask your caregiver if you should take aspirin to prevent strokes.  Diabetes screening involves taking a blood sample to check your fasting blood sugar level. This should be done once every 3 years, after age 80, if you are within normal weight and without  risk factors for diabetes. Testing should be considered at a younger age or be carried out more frequently if you are overweight and have at least 1 risk factor for diabetes.  Breast cancer screening is essential preventative care for women. You should practice "breast self-awareness." This means understanding the normal appearance and feel of your breasts and may include breast self-examination. Any changes  detected, no matter how small, should be reported to a caregiver. Women in their 8s and 30s should have a clinical breast exam (CBE) by a caregiver as part of a regular health exam every 1 to 3 years. After age 59, women should have a CBE every year. Starting at age 8, women should consider having a mammogram (breast X-ray) every year. Women who have a family history of breast cancer should talk to their caregiver about genetic screening. Women at a high risk of breast cancer should talk to their caregiver about having an MRI and a mammogram every year.  The Pap test is a screening test for cervical cancer. Women should have a Pap test starting at age 49. Between ages 28 and 35, Pap tests should be repeated every 2 years. Beginning at age 10, you should have a Pap test every 3 years as long as the past 3 Pap tests have been normal. If you had a hysterectomy for a problem that was not cancer or a condition that could lead to cancer, then you no longer need Pap tests. If you are between ages 24 and 33, and you have had normal Pap tests going back 10 years, you no longer need Pap tests. If you have had past treatment for cervical cancer or a condition that could lead to cancer, you need Pap tests and screening for cancer for at least 20 years after your treatment. If Pap tests have been discontinued, risk factors (such as a new sexual partner) need to be reassessed to determine if screening should be resumed. Some women have medical problems that increase the chance of getting cervical cancer. In these cases, your caregiver may recommend more frequent screening and Pap tests.  The human papillomavirus (HPV) test is an additional test that may be used for cervical cancer screening. The HPV test looks for the virus that can cause the cell changes on the cervix. The cells collected during the Pap test can be tested for HPV. The HPV test could be used to screen women aged 10 years and older, and should be used in  women of any age who have unclear Pap test results. After the age of 85, women should have HPV testing at the same frequency as a Pap test.  Colorectal cancer can be detected and often prevented. Most routine colorectal cancer screening begins at the age of 86 and continues through age 83. However, your caregiver may recommend screening at an earlier age if you have risk factors for colon cancer. On a yearly basis, your caregiver may provide home test kits to check for hidden blood in the stool. Use of a small camera at the end of a tube, to directly examine the colon (sigmoidoscopy or colonoscopy), can detect the earliest forms of colorectal cancer. Talk to your caregiver about this at age 71, when routine screening begins. Direct examination of the colon should be repeated every 5 to 10 years through age 54, unless early forms of pre-cancerous polyps or small growths are found.  Hepatitis C blood testing is recommended for all people born from [birth year] through 1965 and  any individual with known risks for hepatitis C.  Practice safe sex. Use condoms and avoid high-risk sexual practices to reduce the spread of sexually transmitted infections (STIs). Sexually active women aged 34 and younger should be checked for Chlamydia, which is a common sexually transmitted infection. Older women with new or multiple partners should also be tested for Chlamydia. Testing for other STIs is recommended if you are sexually active and at increased risk.  Osteoporosis is a disease in which the bones lose minerals and strength with aging. This can result in serious bone fractures. The risk of osteoporosis can be identified using a bone density scan. Women ages 45 and over and women at risk for fractures or osteoporosis should discuss screening with their caregivers. Ask your caregiver whether you should be taking a calcium supplement or vitamin D to reduce the rate of osteoporosis.  Menopause can be associated with physical  symptoms and risks. Hormone replacement therapy is available to decrease symptoms and risks. You should talk to your caregiver about whether hormone replacement therapy is right for you.  Use sunscreen with a sun protection factor (SPF) of 30 or greater. Apply sunscreen liberally and repeatedly throughout the day. You should seek shade when your shadow is shorter than you. Protect yourself by wearing long sleeves, pants, a wide-brimmed hat, and sunglasses year round, whenever you are outdoors.  Notify your caregiver of new moles or changes in moles, especially if there is a change in shape or color. Also notify your caregiver if a mole is larger than the size of a pencil eraser.  Stay current with your immunizations. Document Released: 10/03/2010 Document Revised: 06/12/2011 Document Reviewed: 10/03/2010 [facility] Patient Information 2014 [place].

## 2012-09-30 NOTE — Assessment & Plan Note (Signed)
Prescribed Asacol 3 times daily 4 chronic bloat and diarrhea symptoms Compliance issues due to cost reviewed, importance of adherence to prescribed therapy reviewed -but also notes nausea and vomiting side effects related to medication therapy Working with gastroenterology on same Interval history reviewed, support provided

## 2012-09-30 NOTE — Assessment & Plan Note (Signed)
Prior urologic evaluation in 2012 unremarkable No gross hematuria No further evaluation needed at this time Patient advised on importance of smoking cessation in the setting of this and other chronic medical issues

## 2012-12-31 ENCOUNTER — Ambulatory Visit: Payer: BC Managed Care – PPO | Admitting: Internal Medicine

## 2013-01-13 ENCOUNTER — Other Ambulatory Visit: Payer: Self-pay

## 2013-01-13 MED ORDER — METOPROLOL SUCCINATE ER 50 MG PO TB24: ORAL_TABLET | ORAL | Status: DC

## 2013-01-13 NOTE — Telephone Encounter (Signed)
called to notify pt refill for metoprolol sent in.

## 2013-01-14 ENCOUNTER — Other Ambulatory Visit: Payer: Self-pay

## 2013-01-14 MED ORDER — METOPROLOL TARTRATE 25 MG PO TABS: 25.0000 mg | ORAL_TABLET | Freq: Two times a day (BID) | ORAL | Status: DC

## 2013-01-17 ENCOUNTER — Ambulatory Visit: Payer: BC Managed Care – PPO | Admitting: Internal Medicine

## 2013-01-22 ENCOUNTER — Encounter: Payer: Self-pay | Admitting: Internal Medicine

## 2013-01-22 ENCOUNTER — Ambulatory Visit (INDEPENDENT_AMBULATORY_CARE_PROVIDER_SITE_OTHER): Payer: BC Managed Care – PPO | Admitting: Internal Medicine

## 2013-01-22 ENCOUNTER — Ambulatory Visit (INDEPENDENT_AMBULATORY_CARE_PROVIDER_SITE_OTHER)
Admission: RE | Admit: 2013-01-22 | Discharge: 2013-01-22 | Disposition: A | Discharge status: Home / Self Care | Payer: BC Managed Care – PPO | Source: Ambulatory Visit | Attending: Internal Medicine | Admitting: Internal Medicine

## 2013-01-22 VITALS — BP 140/72 | HR 86 | Temp 98.4°F | Wt 160.0 lb

## 2013-01-22 DIAGNOSIS — I5022 Chronic systolic (congestive) heart failure: Secondary | ICD-10-CM

## 2013-01-22 DIAGNOSIS — F17200 Nicotine dependence, unspecified, uncomplicated: Secondary | ICD-10-CM

## 2013-01-22 DIAGNOSIS — R0989 Other specified symptoms and signs involving the circulatory and respiratory systems: Secondary | ICD-10-CM

## 2013-01-22 DIAGNOSIS — R0609 Other forms of dyspnea: Secondary | ICD-10-CM

## 2013-01-22 DIAGNOSIS — R0600 Dyspnea, unspecified: Secondary | ICD-10-CM

## 2013-01-22 DIAGNOSIS — Z23 Encounter for immunization: Secondary | ICD-10-CM

## 2013-01-22 DIAGNOSIS — F17210 Nicotine dependence, cigarettes, uncomplicated: Secondary | ICD-10-CM

## 2013-01-22 NOTE — Progress Notes (Signed)
Subjective:    Patient ID: Erica [name], female    DOB: 09-24-1950, 62 y.o.   MRN: [id number]  HPI  patient is here today for follow up - reviewed chronic medical issues:   colon cancer - dx 03/2009 and s/p r hemicolectomy for same - multiple SBO in past   crohns - dx 02/2010 due to ulcerations (also NSAID induced in DDx) - follows with GI for same - failed trial entercort -symptoms unchanged, continued diarrhea, abdominal pain episodes, nausea and vomiting - cont diffuse bloating - no unintentional weight loss  HTN - reports compliance with ongoing medical treatment and no changes in medication dose or frequency. denies adverse side effects related to current therapy. 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 Takotsubo syndrome 12/2008  . ANEMIA   . OSTEOARTHRITIS   . URINARY INCONTINENCE   . ADENOCARCINOMA, COLON, HX OF 03/2009  . BACK PAIN, LUMBAR   . HYPERTENSION   . HYPERLIPIDEMIA   . GERD   . Crohn's  02/2010    ileal ulcers, intol of entercort  . Microscopic hematuria     chronic  . CHF (congestive heart failure)   . History of transfusion of whole blood   . Obstruction of intestine or colon     adhesions    Review of Systems  Constitutional: Positive for fatigue. Negative for fever.  Respiratory: Positive for shortness of breath. Negative for cough, chest tightness and wheezing.   Cardiovascular: Negative for chest pain and leg swelling.  Musculoskeletal: Positive for back pain and gait problem.  Neurological: Negative for headaches.       Objective:   Physical Exam BP 140/72  Pulse 86  Temp(Src) 98.4 F (36.9 C) (Oral)  Wt 160 lb (72.576 kg)  BMI 29.26 kg/m2  SpO2 94%  Wt Readings from Last 3 Encounters:  01/22/13 160 lb (72.576 kg)  09/30/12 159 lb (72.122 kg)  09/17/12 159 lb (72.122 kg)   Constitutional: She appears well-developed and well-nourished. No distress.   Neck: Normal range of motion. Neck supple. No JVD present. No thyromegaly present.  Cardiovascular: Normal rate, regular rhythm and normal heart sounds.  No murmur heard. No BLE edema. Pulmonary/Chest: breath sounds with rhonchi bilaterally. Left-sided basilar rub, but no respiratory distress. She has no expiratory wheezes.  Musculoskeletal: B knee - boggy synovitis - tender to palpation over joint line; FROM and ligamentous function intact -Normal range of motion, no joint effusions. No gross deformities Skin: Skin is warm and dry. No rash noted. No erythema.  Psychiatric: She has a mildly anxious mood and affect. Her behavior is normal. Judgment and thought content normal.   Lab Results  Component Value Date   WBC 9.8 09/30/2012   HGB 13.9 09/30/2012   HCT 40.8 09/30/2012   PLT 271.0 09/30/2012   GLUCOSE 90 09/30/2012   CHOL 225* 09/30/2012   TRIG 238.0* 09/30/2012   HDL 54.40 09/30/2012   LDLDIRECT 138.5 09/30/2012   LDLCALC 101* 02/22/2011   ALT 17 09/30/2012   AST 17 09/30/2012   NA 138 09/30/2012   K 4.6 09/30/2012   CL 105 09/30/2012   CREATININE 0.9 09/30/2012   BUN 17 09/30/2012   CO2 28 09/30/2012   TSH 0.55 09/30/2012   INR 0.95 03/29/2010   HGBA1C  Value: 5.4 (NOTE) The ADA recommends the following therapeutic goal for glycemic control related to Hgb A1c measurement:  Goal of therapy: <6.5 Hgb A1c  Reference: American Diabetes Association: Clinical Practice Recommendations 2010, Diabetes Care, 2010, 33: (Suppl  1). 02/10/2009        Assessment & Plan:   Dyspnea, acute on chronic. No other clear anginal symptoms on history. No exam evidence for decompensated heart failure, but reviewed prior CM (LVEF 40-45% in 2010 by Echo). Ongoing tobacco abuse and history of pneumonia. Patient reports current symptoms feel similar to prior pneumonia. Therefore check two-view chest x-ray, treat if abnormal. Also refer for 2-D echo No medication treatment changes recommended at this time, but will  prescribed based on results  Advised on the need for smoking cessation  Also See problem list. Medications and labs reviewed today.

## 2013-01-22 NOTE — Assessment & Plan Note (Signed)
Last echocardiogram 2010 with LVEF of 40-45% Given increasing dyspnea symptoms, will recheck echocardiogram to evaluate LV function

## 2013-01-22 NOTE — Progress Notes (Signed)
Pre-visit discussion using our clinic review tool. No additional management support is needed unless otherwise documented below in the visit note.  

## 2013-01-22 NOTE — Patient Instructions (Signed)
It was good to see you today.  Test(s) ordered today. Your results will be released to [place] (or called to you) after review, usually within 72hours after test completion. If any changes need to be made, you will be notified at that same time.  we'll make referral a 2-D echocardiogram to evaluate ultrasound of your heart. Our office will contact you regarding appointment(s) once made.  Medications reviewed and updated, no changes recommended at this time.  Please schedule followup in 6 weeks if shortness of breath not improved, call sooner if problems.

## 2013-04-08 ENCOUNTER — Telehealth: Payer: Self-pay | Admitting: *Deleted

## 2013-04-08 MED ORDER — HYDROCODONE-ACETAMINOPHEN 5-325 MG PO TABS: 1.0000 | ORAL_TABLET | Freq: Four times a day (QID) | ORAL | Status: DC | PRN

## 2013-04-08 NOTE — Telephone Encounter (Signed)
Notified pt rx ready for pick-up.../lmb 

## 2013-04-08 NOTE — Telephone Encounter (Signed)
Patient requesting hydrocodone refill.  Please advise.  CB# [place] [phone]

## 2013-04-08 NOTE — Telephone Encounter (Signed)
done

## 2013-06-16 ENCOUNTER — Telehealth: Payer: Self-pay | Admitting: *Deleted

## 2013-06-16 MED ORDER — HYDROCODONE-ACETAMINOPHEN 5-325 MG PO TABS: 1.0000 | ORAL_TABLET | Freq: Four times a day (QID) | ORAL | Status: DC | PRN

## 2013-06-16 NOTE — Telephone Encounter (Signed)
done

## 2013-06-16 NOTE — Telephone Encounter (Signed)
Pt informed Rx ready for pick up.

## 2013-06-16 NOTE — Telephone Encounter (Signed)
Pt called requesting Hydrocodone refill.  Please advise in Dr Katheren Puller absence

## 2013-07-18 ENCOUNTER — Ambulatory Visit (INDEPENDENT_AMBULATORY_CARE_PROVIDER_SITE_OTHER): Payer: Managed Care, Other (non HMO) | Admitting: Internal Medicine

## 2013-07-18 ENCOUNTER — Encounter: Payer: Self-pay | Admitting: Internal Medicine

## 2013-07-18 ENCOUNTER — Encounter: Payer: Self-pay | Admitting: *Deleted

## 2013-07-18 ENCOUNTER — Other Ambulatory Visit (INDEPENDENT_AMBULATORY_CARE_PROVIDER_SITE_OTHER): Payer: Managed Care, Other (non HMO)

## 2013-07-18 VITALS — BP 130/72 | HR 81 | Temp 98.3°F | Wt 158.4 lb

## 2013-07-18 DIAGNOSIS — E785 Hyperlipidemia, unspecified: Secondary | ICD-10-CM

## 2013-07-18 DIAGNOSIS — R0609 Other forms of dyspnea: Secondary | ICD-10-CM

## 2013-07-18 DIAGNOSIS — F17200 Nicotine dependence, unspecified, uncomplicated: Secondary | ICD-10-CM

## 2013-07-18 DIAGNOSIS — R0989 Other specified symptoms and signs involving the circulatory and respiratory systems: Secondary | ICD-10-CM

## 2013-07-18 DIAGNOSIS — K5080 Crohn's disease of both small and large intestine without complications: Secondary | ICD-10-CM

## 2013-07-18 DIAGNOSIS — I5022 Chronic systolic (congestive) heart failure: Secondary | ICD-10-CM

## 2013-07-18 DIAGNOSIS — R3129 Other microscopic hematuria: Secondary | ICD-10-CM

## 2013-07-18 DIAGNOSIS — M1990 Unspecified osteoarthritis, unspecified site: Secondary | ICD-10-CM

## 2013-07-18 DIAGNOSIS — R0600 Dyspnea, unspecified: Secondary | ICD-10-CM

## 2013-07-18 LAB — CBC WITH DIFFERENTIAL/PLATELET
Basophils Absolute: 0.1 10*3/uL (ref 0.0–0.1)
Basophils Relative: 0.8 % (ref 0.0–3.0)
Eosinophils Absolute: 0.1 10*3/uL (ref 0.0–0.7)
Eosinophils Relative: 1.6 % (ref 0.0–5.0)
HCT: 41.1 % (ref 36.0–46.0)
Hemoglobin: 13.7 g/dL (ref 12.0–15.0)
Lymphocytes Relative: 34 % (ref 12.0–46.0)
Lymphs Abs: 3 10*3/uL (ref 0.7–4.0)
MCHC: 33.4 g/dL (ref 30.0–36.0)
MCV: 91 fl (ref 78.0–100.0)
Monocytes Absolute: 0.6 10*3/uL (ref 0.1–1.0)
Monocytes Relative: 6.8 % (ref 3.0–12.0)
Neutro Abs: 5.1 10*3/uL (ref 1.4–7.7)
Neutrophils Relative %: 56.8 % (ref 43.0–77.0)
Platelets: 251 10*3/uL (ref 150.0–400.0)
RBC: 4.51 Mil/uL (ref 3.87–5.11)
RDW: 13.2 % (ref 11.5–14.6)
WBC: 9 10*3/uL (ref 4.5–10.5)

## 2013-07-18 LAB — HEPATIC FUNCTION PANEL
ALT: 14 U/L (ref 0–35)
AST: 17 U/L (ref 0–37)
Albumin: 3.9 g/dL (ref 3.5–5.2)
Alkaline Phosphatase: 70 U/L (ref 39–117)
Bilirubin, Direct: 0.1 mg/dL (ref 0.0–0.3)
Total Bilirubin: 0.7 mg/dL (ref 0.3–1.2)
Total Protein: 7.1 g/dL (ref 6.0–8.3)

## 2013-07-18 LAB — URINALYSIS, ROUTINE W REFLEX MICROSCOPIC
Bilirubin Urine: NEGATIVE
Ketones, ur: NEGATIVE
Leukocytes, UA: NEGATIVE
Nitrite: NEGATIVE
Specific Gravity, Urine: 1.02 (ref 1.000–1.030)
Total Protein, Urine: NEGATIVE
Urine Glucose: NEGATIVE
Urobilinogen, UA: 0.2 (ref 0.0–1.0)
WBC, UA: NONE SEEN (ref 0–?)
pH: 5.5 (ref 5.0–8.0)

## 2013-07-18 LAB — BASIC METABOLIC PANEL
BUN: 12 mg/dL (ref 6–23)
CO2: 28 mEq/L (ref 19–32)
Calcium: 9.8 mg/dL (ref 8.4–10.5)
Chloride: 106 mEq/L (ref 96–112)
Creatinine, Ser: 0.8 mg/dL (ref 0.4–1.2)
GFR: 81.61 mL/min (ref >60.00)
Glucose, Bld: 95 mg/dL (ref 70–99)
Potassium: 5.1 mEq/L (ref 3.5–5.1)
Sodium: 141 mEq/L (ref 135–145)

## 2013-07-18 LAB — TSH: TSH: 0.82 u[IU]/mL (ref 0.35–5.50)

## 2013-07-18 MED ORDER — ALBUTEROL SULFATE HFA 108 (90 BASE) MCG/ACT IN AERS: 2.0000 | INHALATION_SPRAY | Freq: Four times a day (QID) | RESPIRATORY_TRACT | Status: DC | PRN

## 2013-07-18 MED ORDER — HYDROCODONE-ACETAMINOPHEN 5-325 MG PO TABS: 1.0000 | ORAL_TABLET | Freq: Four times a day (QID) | ORAL | Status: DC | PRN

## 2013-07-18 NOTE — Assessment & Plan Note (Signed)
S/p TKR 03/2010 and manipulation 05/2010 - continued pain/problems with gait - ?realted to back - continue hydrocodone as needed and advised follow up with sports med

## 2013-07-18 NOTE — Progress Notes (Signed)
Subjective:    Patient ID: Erica [name], female    DOB: 19-Aug-1950, 63 y.o.   MRN: [id number]  HPI  Patient is here for follow up  Reviewed chronic medical issues and interval medical events  Past Medical History  Diagnosis Date  . Takotsubo syndrome 12/2008  . ANEMIA   . OSTEOARTHRITIS   . URINARY INCONTINENCE   . ADENOCARCINOMA, COLON, HX OF 03/2009  . BACK PAIN, LUMBAR   . HYPERTENSION   . HYPERLIPIDEMIA   . GERD   . Crohn's  02/2010    ileal ulcers, intol of entercort  . Microscopic hematuria     chronic  . CHF (congestive heart failure)   . History of transfusion of whole blood   . Obstruction of intestine or colon     adhesions    Review of Systems  Constitutional: Positive for fatigue (chronic). Negative for fever and unexpected weight change.  Respiratory: Positive for cough, shortness of breath and wheezing. Negative for chest tightness.   Cardiovascular: Negative for chest pain, palpitations and leg swelling.  Musculoskeletal: Positive for arthralgias (shoulder, knee), gait problem and joint swelling. Negative for myalgias.       Objective:   Physical Exam  BP 130/72  Pulse 81  Temp(Src) 98.3 F (36.8 C) (Oral)  Wt 158 lb 6.4 oz (71.85 kg)  SpO2 95% Wt Readings from Last 3 Encounters:  07/18/13 158 lb 6.4 oz (71.85 kg)  01/22/13 160 lb (72.576 kg)  09/30/12 159 lb (72.122 kg)   Constitutional: She appears well-developed and well-nourished. No distress.  Neck: Normal range of motion. Neck supple. No JVD present. No thyromegaly present.  Cardiovascular: Normal rate, regular rhythm and normal heart sounds.  No murmur heard. No BLE edema. Pulmonary/Chest: Effort normal and breath sounds coarse. No respiratory distress. She has no wheezes.  Psychiatric: She has a normal mood and affect. Her behavior is normal. Judgment and thought content normal.   Lab Results  Component Value Date   WBC 9.8 09/30/2012   HGB 13.9 09/30/2012   HCT 40.8 09/30/2012     PLT 271.0 09/30/2012   GLUCOSE 90 09/30/2012   CHOL 225* 09/30/2012   TRIG 238.0* 09/30/2012   HDL 54.40 09/30/2012   LDLDIRECT 138.5 09/30/2012   LDLCALC 101* 02/22/2011   ALT 17 09/30/2012   AST 17 09/30/2012   NA 138 09/30/2012   K 4.6 09/30/2012   CL 105 09/30/2012   CREATININE 0.9 09/30/2012   BUN 17 09/30/2012   CO2 28 09/30/2012   TSH 0.55 09/30/2012   INR 0.95 03/29/2010   HGBA1C  Value: 5.4 (NOTE) The ADA recommends the following therapeutic goal for glycemic control related to Hgb A1c measurement: Goal of therapy: <6.5 Hgb A1c  Reference: American Diabetes Association: Clinical Practice Recommendations 2010, Diabetes Care, 2010, 33: (Suppl  1). 02/10/2009    Dg Chest 2 View  01/22/2013   CLINICAL DATA:  Shortness of breath, follow-up pneumonia  EXAM: CHEST  2 VIEW  COMPARISON:  06/22/2012  FINDINGS: Lungs are essentially clear. No focal consolidation. No pleural effusion or pneumothorax.  Heart is normal in size.  Mild degenerative changes of the visualized thoracolumbar spine.  IMPRESSION: No evidence of acute cardiopulmonary disease.   Electronically Signed   By: Julian Hy M.D.   On: 01/22/2013 15:23   ECG: sinus at 68 beats per minute. Low voltage diffusely, no ischemic change or arrhythmia     Assessment & Plan:   Dyspnea on exertion, acute on  chronic.  Unchanged from 01/2013, but continued daily symptoms - No other clear anginal symptoms on history. No exam evidence for decompensated heart failure, but reviewed prior CM (LVEF 40-45% in 2010 by Echo). Ongoing tobacco abuse Reviewed two-view chest x-ray 01/2013: NAD  refer for 2-D echo and PFTs now  Give Alb MDI prn and again advised on the need for smoking cessation  Pt agrees to follow up in 6 weeks, sooner if worse  Problem List Items Addressed This Visit   CHRONIC SYSTOLIC HEART FAILURE     Last echocardiogram 2010 with LVEF of 40-45% Given increasing dyspnea symptoms, will recheck echocardiogram to evaluate LV  function    Microscopic hematuria     Prior urologic evaluation in 2012 unremarkable No gross hematuria Recheck UA now Patient advised on importance of smoking cessation in the setting of this and other chronic medical issues    OSTEOARTHRITIS     S/p TKR 03/2010 and manipulation 05/2010 - continued pain/problems with gait - ?realted to back - continue hydrocodone as needed and advised follow up with sports med    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

## 2013-07-18 NOTE — Assessment & Plan Note (Signed)
Prior urologic evaluation in 2012 unremarkable No gross hematuria Recheck UA now Patient advised on importance of smoking cessation in the setting of this and other chronic medical issues

## 2013-07-18 NOTE — Progress Notes (Signed)
Pre visit review using our clinic review tool, if applicable. No additional management support is needed unless otherwise documented below in the visit note. 

## 2013-07-18 NOTE — Assessment & Plan Note (Signed)
Prescribed Asacol 3 times daily for chronic bloat and diarrhea symptoms Unable to afford same with insurance changes (new disability since 01/2013) - on no tx since 05/2013 Compliance issues due to cost reviewed Advised to resume working with gastroenterology on same Interval history reviewed, support provided

## 2013-07-18 NOTE — Patient Instructions (Signed)
It was good to see you today.  We have reviewed your prior records including labs and tests today  Test(s) ordered today. Your results will be released to [facility] (or called to you) after review, usually within 72hours after test completion. If any changes need to be made, you will be notified at that same time.  we'll make referral a 2-D echocardiogram ultrasound to evaluate your heart and for PFTs (Pulmonary function test) to evaluate your lungs. Our office will contact you regarding appointment(s) once made.  Medications reviewed and updated Use albuterol rescue inhaler every 4 hours as needed for shortness of breath or wheeze symptoms - no other changes recommended at this time.  Your prescription(s) have been submitted to your pharmacy. Please take as directed and contact our office if you believe you are having problem(s) with the medication(s).  Please schedule followup in 6 weeks if shortness of breath not improved, call sooner if problems.

## 2013-07-18 NOTE — Assessment & Plan Note (Signed)
Last echocardiogram 2010 with LVEF of 40-45% Given increasing dyspnea symptoms, will recheck echocardiogram to evaluate LV function

## 2013-07-29 ENCOUNTER — Ambulatory Visit: Payer: Managed Care, Other (non HMO) | Admitting: Family Medicine

## 2013-07-31 ENCOUNTER — Encounter: Payer: Self-pay | Admitting: Interventional Cardiology

## 2013-08-08 ENCOUNTER — Other Ambulatory Visit: Payer: Self-pay | Admitting: Interventional Cardiology

## 2013-08-14 ENCOUNTER — Encounter: Payer: Self-pay | Admitting: Internal Medicine

## 2013-09-03 ENCOUNTER — Encounter: Payer: Self-pay | Admitting: Internal Medicine

## 2013-09-03 ENCOUNTER — Ambulatory Visit: Payer: BC Managed Care – PPO | Admitting: Interventional Cardiology

## 2013-09-03 ENCOUNTER — Ambulatory Visit (INDEPENDENT_AMBULATORY_CARE_PROVIDER_SITE_OTHER): Payer: Managed Care, Other (non HMO) | Admitting: Internal Medicine

## 2013-09-03 VITALS — BP 122/82 | HR 80 | Temp 98.1°F | Wt 156.0 lb

## 2013-09-03 DIAGNOSIS — R0989 Other specified symptoms and signs involving the circulatory and respiratory systems: Secondary | ICD-10-CM

## 2013-09-03 DIAGNOSIS — R0600 Dyspnea, unspecified: Secondary | ICD-10-CM

## 2013-09-03 DIAGNOSIS — F17200 Nicotine dependence, unspecified, uncomplicated: Secondary | ICD-10-CM

## 2013-09-03 DIAGNOSIS — J441 Chronic obstructive pulmonary disease with (acute) exacerbation: Secondary | ICD-10-CM | POA: Insufficient documentation

## 2013-09-03 DIAGNOSIS — I5022 Chronic systolic (congestive) heart failure: Secondary | ICD-10-CM

## 2013-09-03 DIAGNOSIS — R0609 Other forms of dyspnea: Secondary | ICD-10-CM

## 2013-09-03 DIAGNOSIS — F17210 Nicotine dependence, cigarettes, uncomplicated: Secondary | ICD-10-CM

## 2013-09-03 MED ORDER — ALBUTEROL SULFATE HFA 108 (90 BASE) MCG/ACT IN AERS: 2.0000 | INHALATION_SPRAY | Freq: Four times a day (QID) | RESPIRATORY_TRACT | Status: DC | PRN

## 2013-09-03 MED ORDER — HYDROCODONE-ACETAMINOPHEN 5-325 MG PO TABS: 1.0000 | ORAL_TABLET | Freq: Four times a day (QID) | ORAL | Status: DC | PRN

## 2013-09-03 NOTE — Assessment & Plan Note (Signed)
Dyspnea on exertion, acute on chronic.  Unchanged from 01/2013, but continued daily symptoms - No other clear anginal symptoms on history.  No exam evidence for decompensated heart failure, but reviewed prior CM (LVEF 40-45% in 2010 by Echo).  Ongoing tobacco abuse  Reviewed two-view chest x-ray 01/2013: NAD  refer again for 2-D echo and PFTs (?not scheduled as ordered 07/2013)  Give new paper rx for Alb MDI prn (not prev filled due to cost at selected pharmacy)   again advised on the need for smoking cessation

## 2013-09-03 NOTE — Patient Instructions (Signed)
It was good to see you today.  We have reviewed your prior records including labs and tests today  we'll make referral a 2-D echocardiogram ultrasound to evaluate your heart and for PFTs (Pulmonary function test) to evaluate your lungs. Our office will contact you regarding appointment(s) once made.  Medications reviewed and updated Use albuterol rescue inhaler every 4 hours as needed for shortness of breath or wheeze symptoms - no other changes recommended at this time.  Your prescription(s) have been printed for you to submit to your pharmacy. Please take as directed and contact our office if you believe you are having problem(s) with the medication(s).  Please schedule followup in 3 months to review if shortness of breath not improved, call sooner if problems.

## 2013-09-03 NOTE — Assessment & Plan Note (Signed)
Last echocardiogram 2010 with LVEF of 40-45% Given increasing dyspnea symptoms, recheck echocardiogram to evaluate LV function

## 2013-09-03 NOTE — Assessment & Plan Note (Signed)
5 minutes today spent counseling patient on unhealthy effects of continued tobacco abuse and encouragement of cessation including medical options available to help the patient quit smoking. 

## 2013-09-03 NOTE — Progress Notes (Signed)
Pre visit review using our clinic review tool, if applicable. No additional management support is needed unless otherwise documented below in the visit note. 

## 2013-09-03 NOTE — Progress Notes (Signed)
Subjective:    Patient ID: Erica [name], female    DOB: May 20, 1950, 63 y.o.   MRN: [id number]  HPI  Patient is here for follow up -dyspnea on exertion  Reviewed chronic medical issues and interval medical events  Past Medical History  Diagnosis Date  . Takotsubo syndrome 12/2008  . ANEMIA   . OSTEOARTHRITIS   . URINARY INCONTINENCE   . ADENOCARCINOMA, COLON, HX OF 03/2009  . BACK PAIN, LUMBAR   . HYPERTENSION   . HYPERLIPIDEMIA   . GERD   . Crohn's  02/2010    ileal ulcers, intol of entercort  . Microscopic hematuria     chronic  . CHF (congestive heart failure)   . History of transfusion of whole blood   . Obstruction of intestine or colon     adhesions    Review of Systems  Constitutional: Positive for fatigue. Negative for fever and unexpected weight change.  Respiratory: Positive for shortness of breath (mild exertional, esp outdoors). Negative for cough and wheezing.   Cardiovascular: Negative for chest pain, palpitations and leg swelling.  Psychiatric/Behavioral:       "Stress" -spouse illness, family issues       Objective:   Physical Exam  BP 122/82  Pulse 80  Temp(Src) 98.1 F (36.7 C) (Oral)  Wt 156 lb (70.761 kg)  SpO2 95% Wt Readings from Last 3 Encounters:  09/03/13 156 lb (70.761 kg)  07/18/13 158 lb 6.4 oz (71.85 kg)  01/22/13 160 lb (72.576 kg)   Constitutional: She appears well-developed and well-nourished. No distress.  Neck: Normal range of motion. Neck supple. No JVD present. No thyromegaly present.  Cardiovascular: Normal rate, regular rhythm and normal heart sounds.  No murmur heard. No BLE edema. Pulmonary/Chest: Effort normal and breath sounds normal. No respiratory distress. She has no wheezes.  Psychiatric: She has a normal mood and affect. Her behavior is normal. Judgment and thought content normal.   Lab Results  Component Value Date   WBC 9.0 07/18/2013   HGB 13.7 07/18/2013   HCT 41.1 07/18/2013   PLT 251.0 07/18/2013   GLUCOSE 95 07/18/2013   CHOL 225* 09/30/2012   TRIG 238.0* 09/30/2012   HDL 54.40 09/30/2012   LDLDIRECT 138.5 09/30/2012   LDLCALC 101* 02/22/2011   ALT 14 07/18/2013   AST 17 07/18/2013   NA 141 07/18/2013   K 5.1 07/18/2013   CL 106 07/18/2013   CREATININE 0.8 07/18/2013   BUN 12 07/18/2013   CO2 28 07/18/2013   TSH 0.82 07/18/2013   INR 0.95 03/29/2010   HGBA1C  Value: 5.4 (NOTE) The ADA recommends the following therapeutic goal for glycemic control related to Hgb A1c measurement: Goal of therapy: <6.5 Hgb A1c  Reference: American Diabetes Association: Clinical Practice Recommendations 2010, Diabetes Care, 2010, 33: (Suppl  1). 02/10/2009    Dg Chest 2 View  01/22/2013   CLINICAL DATA:  Shortness of breath, follow-up pneumonia  EXAM: CHEST  2 VIEW  COMPARISON:  06/22/2012  FINDINGS: Lungs are essentially clear. No focal consolidation. No pleural effusion or pneumothorax.  Heart is normal in size.  Mild degenerative changes of the visualized thoracolumbar spine.  IMPRESSION: No evidence of acute cardiopulmonary disease.   Electronically Signed   By: Julian Hy M.D.   On: 01/22/2013 15:23       Assessment & Plan:   Problem List Items Addressed This Visit   CHRONIC SYSTOLIC HEART FAILURE     Last echocardiogram 2010 with LVEF of  40-45% Given increasing dyspnea symptoms, recheck echocardiogram to evaluate LV function    Cigarette smoker     5 minutes today spent counseling patient on unhealthy effects of continued tobacco abuse and encouragement of cessation including medical options available to help the patient quit smoking.     DOE (dyspnea on exertion) - Primary     Dyspnea on exertion, acute on chronic.  Unchanged from 01/2013, but continued daily symptoms - No other clear anginal symptoms on history.  No exam evidence for decompensated heart failure, but reviewed prior CM (LVEF 40-45% in 2010 by Echo).  Ongoing tobacco abuse  Reviewed two-view chest x-ray 01/2013:  NAD  refer again for 2-D echo and PFTs (?not scheduled as ordered 07/2013)  Give new paper rx for Alb MDI prn (not prev filled due to cost at selected pharmacy)   again advised on the need for smoking cessation

## 2013-10-06 ENCOUNTER — Telehealth: Payer: Self-pay | Admitting: *Deleted

## 2013-10-06 NOTE — Telephone Encounter (Signed)
Left msg on triage requesting call back. Called pt back she stated she is out of state in [place] think she has a upper respiratory infection wanting md to rx something. Inform pt before md can rx something she will need to be seen. Advise her to go to urgent care center in [place] to get evaluated. Pt agreed and stated once she get back if not better will call bck...Erica [name]

## 2013-10-15 ENCOUNTER — Ambulatory Visit (INDEPENDENT_AMBULATORY_CARE_PROVIDER_SITE_OTHER): Payer: Medicare HMO | Admitting: Internal Medicine

## 2013-10-15 DIAGNOSIS — R0600 Dyspnea, unspecified: Secondary | ICD-10-CM

## 2013-10-15 DIAGNOSIS — R0989 Other specified symptoms and signs involving the circulatory and respiratory systems: Secondary | ICD-10-CM

## 2013-10-15 DIAGNOSIS — F17200 Nicotine dependence, unspecified, uncomplicated: Secondary | ICD-10-CM

## 2013-10-15 DIAGNOSIS — R0609 Other forms of dyspnea: Secondary | ICD-10-CM

## 2013-10-15 LAB — PULMONARY FUNCTION TEST
DL/VA % pred: 75 %
DL/VA: 3.45 ml/min/mmHg/L
DLCO unc % pred: 73 %
DLCO unc: 16.08 ml/min/mmHg
FEF 25-75 Post: 0.59 L/sec
FEF 25-75 Pre: 0.58 L/sec
FEF2575-%Change-Post: 2 %
FEF2575-%Pred-Post: 28 %
FEF2575-%Pred-Pre: 27 %
FEV1-%Change-Post: 0 %
FEV1-%Pred-Post: 59 %
FEV1-%Pred-Pre: 58 %
FEV1-Post: 1.36 L
FEV1-Pre: 1.35 L
FEV1FVC-%Change-Post: 0 %
FEV1FVC-%Pred-Pre: 72 %
FEV6-%Change-Post: 0 %
FEV6-%Pred-Post: 81 %
FEV6-%Pred-Pre: 81 %
FEV6-Post: 2.36 L
FEV6-Pre: 2.34 L
FEV6FVC-%Change-Post: 0 %
FEV6FVC-%Pred-Post: 101 %
FEV6FVC-%Pred-Pre: 101 %
FVC-%Change-Post: 0 %
FVC-%Pred-Post: 80 %
FVC-%Pred-Pre: 80 %
FVC-Post: 2.42 L
FVC-Pre: 2.41 L
Post FEV1/FVC ratio: 56 %
Post FEV6/FVC ratio: 98 %
Pre FEV1/FVC ratio: 56 %
Pre FEV6/FVC Ratio: 97 %
RV % pred: 194 %
RV: 3.83 L
TLC % pred: 134 %
TLC: 6.46 L

## 2013-10-15 NOTE — Progress Notes (Signed)
PFT done today. 

## 2013-10-16 ENCOUNTER — Other Ambulatory Visit (HOSPITAL_COMMUNITY): Payer: Self-pay | Admitting: Internal Medicine

## 2013-10-16 DIAGNOSIS — R0602 Shortness of breath: Secondary | ICD-10-CM

## 2013-10-16 DIAGNOSIS — I5021 Acute systolic (congestive) heart failure: Secondary | ICD-10-CM

## 2013-10-17 ENCOUNTER — Other Ambulatory Visit: Payer: Self-pay | Admitting: Internal Medicine

## 2013-10-17 ENCOUNTER — Encounter: Payer: Self-pay | Admitting: Internal Medicine

## 2013-10-17 ENCOUNTER — Ambulatory Visit (HOSPITAL_COMMUNITY): Payer: Medicare HMO | Attending: Internal Medicine | Admitting: Radiology

## 2013-10-17 DIAGNOSIS — I509 Heart failure, unspecified: Secondary | ICD-10-CM | POA: Insufficient documentation

## 2013-10-17 DIAGNOSIS — I5021 Acute systolic (congestive) heart failure: Secondary | ICD-10-CM

## 2013-10-17 DIAGNOSIS — I5181 Takotsubo syndrome: Secondary | ICD-10-CM

## 2013-10-17 DIAGNOSIS — J431 Panlobular emphysema: Secondary | ICD-10-CM

## 2013-10-17 DIAGNOSIS — R0602 Shortness of breath: Secondary | ICD-10-CM

## 2013-10-17 NOTE — Progress Notes (Signed)
Echocardiogram performed.  

## 2013-10-18 ENCOUNTER — Telehealth: Payer: Self-pay | Admitting: Internal Medicine

## 2013-10-18 NOTE — Telephone Encounter (Signed)
Relevant patient education mailed to patient.  

## 2013-10-21 ENCOUNTER — Telehealth: Payer: Self-pay

## 2013-10-21 ENCOUNTER — Other Ambulatory Visit: Payer: Self-pay

## 2013-10-21 MED ORDER — HYDROCODONE-ACETAMINOPHEN 5-325 MG PO TABS: 1.0000 | ORAL_TABLET | Freq: Four times a day (QID) | ORAL | Status: DC | PRN

## 2013-10-21 NOTE — Telephone Encounter (Signed)
Called in rx refill of hydrocodone.   Inhaler is not covered by [employer]. She is going to have it refilled and have pharm send in a PA. Will complete PA when received.   Also mentioned that [facility] did contact her and that she has an up and coming appointment on the 19th of August.

## 2013-10-21 NOTE — Telephone Encounter (Signed)
PA requested for [place] for PA forms needed.

## 2013-10-22 NOTE — Telephone Encounter (Signed)
Pt informed rx is ready for pick up

## 2013-10-23 ENCOUNTER — Telehealth: Payer: Self-pay | Admitting: Internal Medicine

## 2013-10-23 NOTE — Telephone Encounter (Signed)
Pt came by to request a Rx for a patch or medication to help her quit smoking cigarette. Please contact pt when request is reviewed.

## 2013-10-23 NOTE — Telephone Encounter (Signed)
[employer] sent fax stating that PA for Proventil HFA was approved.  Had question about rx for patches for quitting smoking.

## 2013-10-24 ENCOUNTER — Encounter: Payer: Self-pay | Admitting: Interventional Cardiology

## 2013-10-24 MED ORDER — NICOTINE 14 MG/24HR TD PT24: 14.0000 mg | MEDICATED_PATCH | Freq: Every day | TRANSDERMAL | Status: DC

## 2013-10-24 NOTE — Telephone Encounter (Signed)
LVM for pt in regards to rx request being sent to pharmacy.

## 2013-10-24 NOTE — Telephone Encounter (Signed)
Sent rx for same to [facility] - thanks

## 2013-11-03 ENCOUNTER — Encounter: Payer: Self-pay | Admitting: Interventional Cardiology

## 2013-11-03 ENCOUNTER — Other Ambulatory Visit: Payer: Self-pay | Admitting: Internal Medicine

## 2013-11-03 ENCOUNTER — Telehealth: Payer: Self-pay | Admitting: Internal Medicine

## 2013-11-03 ENCOUNTER — Ambulatory Visit (INDEPENDENT_AMBULATORY_CARE_PROVIDER_SITE_OTHER): Payer: Medicare HMO | Admitting: Interventional Cardiology

## 2013-11-03 VITALS — BP 146/89 | HR 77 | Ht 62.5 in | Wt 157.1 lb

## 2013-11-03 DIAGNOSIS — I10 Essential (primary) hypertension: Secondary | ICD-10-CM

## 2013-11-03 DIAGNOSIS — R0600 Dyspnea, unspecified: Secondary | ICD-10-CM

## 2013-11-03 DIAGNOSIS — I5181 Takotsubo syndrome: Secondary | ICD-10-CM

## 2013-11-03 DIAGNOSIS — R0609 Other forms of dyspnea: Secondary | ICD-10-CM

## 2013-11-03 DIAGNOSIS — E785 Hyperlipidemia, unspecified: Secondary | ICD-10-CM

## 2013-11-03 DIAGNOSIS — R0989 Other specified symptoms and signs involving the circulatory and respiratory systems: Secondary | ICD-10-CM

## 2013-11-03 LAB — BRAIN NATRIURETIC PEPTIDE: Pro B Natriuretic peptide (BNP): 42 pg/mL (ref 0.0–100.0)

## 2013-11-03 NOTE — Patient Instructions (Signed)
Your physician recommends that you continue on your current medications as directed. Please refer to the Current Medication list given to you today.  Your physician recommends that you go to the lab today for a BNP  Your physician recommends that you schedule a follow-up appointment As Needed with Dr [name]

## 2013-11-03 NOTE — Progress Notes (Signed)
Patient ID: Erica [name], female   DOB: 09-18-1950, 63 y.o.   MRN: [id number]    [address], [address] [place], [place]  [address] Phone: [phone] Fax:  [phone]  Date:  11/03/2013   ID:  Erica [name], DOB 11/11/1950, MRN [id number]  PCP:  Gwendolyn Grant, MD   ASSESSMENT:  1. History of stress cardiomyopathy with recovery in LV function to the EF greater than 60% 2011. Coronary angiography did not reveal CAD 2. Hypertension, controlled  3. COPD 4. Dyspnea    PLAN:  1. BNP 2. When necessary cardiology followup   SUBJECTIVE: Erica [name] is a 63 y.o. female who is here with no specific cardiac complaints. She has been troubled by dyspnea. A recent echo demonstrates normal systolic function with mild diastolic dysfunction. She denies orthopnea and edema. Dyspnea is exertional. There is no history of chest pain. Coronary angiography in 2011 revealed normal coronaries at the time of a Stress cardiomyopathy.   Wt Readings from Last 3 Encounters:  11/03/13 157 lb 1.9 oz (71.269 kg)  09/03/13 156 lb (70.761 kg)  07/18/13 158 lb 6.4 oz (71.85 kg)     Past Medical History  Diagnosis Date  . Takotsubo syndrome 12/2008  . ANEMIA   . OSTEOARTHRITIS   . URINARY INCONTINENCE   . ADENOCARCINOMA, COLON, HX OF 03/2009  . BACK PAIN, LUMBAR   . HYPERTENSION   . HYPERLIPIDEMIA   . GERD   . Crohn's  02/2010    ileal ulcers, intol of entercort  . Microscopic hematuria     chronic  . CHF (congestive heart failure)   . History of transfusion of whole blood   . Obstruction of intestine or colon     adhesions  . COPD (chronic obstructive pulmonary disease) with emphysema     Current Outpatient Prescriptions  Medication Sig Dispense Refill  . albuterol (PROVENTIL HFA;VENTOLIN HFA) 108 (90 BASE) MCG/ACT inhaler Inhale 2 puffs into the lungs every 6 (six) hours as needed for wheezing or shortness of breath.  1 Inhaler  0  . cyclobenzaprine (FLEXERIL) 10 MG  tablet Take 1 tablet (10 mg total) by mouth 3 (three) times daily as needed for muscle spasms.  60 tablet  5  . HYDROcodone-acetaminophen (NORCO/VICODIN) 5-325 MG per tablet Take 1 tablet by mouth every 6 (six) hours as needed.  60 tablet  0  . metoprolol tartrate (LOPRESSOR) 25 MG tablet TAKE ONE TABLET BY MOUTH TWICE DAILY  180 tablet  0  . nicotine (NICODERM CQ - DOSED IN MG/24 HOURS) 14 mg/24hr patch Place 1 patch (14 mg total) onto the skin daily.  28 patch  0   No current facility-administered medications for this visit.    Allergies:    Allergies  Allergen Reactions  . Morphine Nausea And Vomiting    Social History:  The patient  reports that she has been smoking Cigarettes.  She has been smoking about 0.50 packs per day. She has never used smokeless tobacco. She reports that she does not drink alcohol or use illicit drugs.   ROS:  Please see the history of present illness.   No palpitations, claudication, orthopnea, PND, or neurological complaints.   All other systems reviewed and negative.   OBJECTIVE: VS:  BP 146/89  Pulse 77  Ht 5' 2.5" (1.588 m)  Wt 157 lb 1.9 oz (71.269 kg)  BMI 28.26 kg/m2 Well nourished, well developed, in no acute distress, appears older than stated age  38: normal Neck:  JVD flat. Carotid bruit absent  Cardiac:  normal S1, S2; RRR; no murmur Lungs:  clear to auscultation bilaterally, no wheezing, rhonchi or rales Abd: soft, nontender, no hepatomegaly Ext: Edema no edema. Pulses 2+ and symmetric  Skin: warm and dry Neuro:  CNs 2-12 intact, no focal abnormalities noted  EKG:  Not performed     Recent Echocardiogram demonstrated normal systolic function with EF 61% and mild diastolic dysfunction  Signed, Illene Labrador III, MD 11/03/2013 10:06 AM

## 2013-11-03 NOTE — Telephone Encounter (Signed)
Relevant patient education mailed to patient.  

## 2013-11-04 ENCOUNTER — Telehealth: Payer: Self-pay | Admitting: Internal Medicine

## 2013-11-04 ENCOUNTER — Telehealth: Payer: Self-pay

## 2013-11-04 NOTE — Telephone Encounter (Signed)
Pharm sent a refill request for Ventolin stating "pt been taking ventolin can we hae a rx for it?"  PA for Proventil was just completed.

## 2013-11-04 NOTE — Telephone Encounter (Signed)
Great - ok for proventil thanks

## 2013-11-04 NOTE — Telephone Encounter (Signed)
Relevant patient education mailed to patient.  

## 2013-11-07 ENCOUNTER — Telehealth: Payer: Self-pay

## 2013-11-07 NOTE — Telephone Encounter (Signed)
Message copied by Lamar Laundry on Fri Nov 07, 2013  9:29 AM ------      Message from: Daneen Schick      Created: Tue Nov 04, 2013  7:13 PM       Normal blood work and SOB is from lung ------

## 2013-11-07 NOTE — Telephone Encounter (Signed)
lmom.  Normal blood work and SOB is from lung.

## 2013-11-12 ENCOUNTER — Institutional Professional Consult (permissible substitution): Payer: Medicare HMO | Admitting: Internal Medicine

## 2013-11-17 ENCOUNTER — Telehealth: Payer: Self-pay

## 2013-11-17 MED ORDER — ALBUTEROL SULFATE HFA 108 (90 BASE) MCG/ACT IN AERS: 2.0000 | INHALATION_SPRAY | Freq: Four times a day (QID) | RESPIRATORY_TRACT | Status: DC | PRN

## 2013-11-17 NOTE — Telephone Encounter (Signed)
erx done - ok to verify same with pharmacy if needed

## 2013-11-17 NOTE — Telephone Encounter (Signed)
Received fax for refill of Ventolin. Pt was rx Proventil HFA at last appointment.   Please advise if it is okay to switch back.

## 2013-11-18 ENCOUNTER — Other Ambulatory Visit: Payer: Self-pay | Admitting: Interventional Cardiology

## 2013-11-19 ENCOUNTER — Encounter: Payer: Self-pay | Admitting: Internal Medicine

## 2013-11-19 ENCOUNTER — Other Ambulatory Visit (INDEPENDENT_AMBULATORY_CARE_PROVIDER_SITE_OTHER): Payer: Medicare HMO

## 2013-11-19 ENCOUNTER — Ambulatory Visit (INDEPENDENT_AMBULATORY_CARE_PROVIDER_SITE_OTHER): Payer: Medicare HMO | Admitting: Internal Medicine

## 2013-11-19 ENCOUNTER — Ambulatory Visit (INDEPENDENT_AMBULATORY_CARE_PROVIDER_SITE_OTHER)
Admission: RE | Admit: 2013-11-19 | Discharge: 2013-11-19 | Disposition: A | Discharge status: Home / Self Care | Payer: Medicare HMO | Source: Ambulatory Visit | Attending: Internal Medicine | Admitting: Internal Medicine

## 2013-11-19 VITALS — BP 130/80 | HR 57 | Temp 98.1°F | Ht 62.5 in | Wt 154.5 lb

## 2013-11-19 DIAGNOSIS — K5080 Crohn's disease of both small and large intestine without complications: Secondary | ICD-10-CM

## 2013-11-19 DIAGNOSIS — J432 Centrilobular emphysema: Secondary | ICD-10-CM

## 2013-11-19 DIAGNOSIS — J438 Other emphysema: Secondary | ICD-10-CM

## 2013-11-19 DIAGNOSIS — K50819 Crohn's disease of both small and large intestine with unspecified complications: Secondary | ICD-10-CM

## 2013-11-19 DIAGNOSIS — F17200 Nicotine dependence, unspecified, uncomplicated: Secondary | ICD-10-CM

## 2013-11-19 DIAGNOSIS — F17210 Nicotine dependence, cigarettes, uncomplicated: Secondary | ICD-10-CM

## 2013-11-19 DIAGNOSIS — R1013 Epigastric pain: Secondary | ICD-10-CM

## 2013-11-19 LAB — BASIC METABOLIC PANEL
BUN: 19 mg/dL (ref 6–23)
CO2: 27 mEq/L (ref 19–32)
Calcium: 9.5 mg/dL (ref 8.4–10.5)
Chloride: 105 mEq/L (ref 96–112)
Creatinine, Ser: 0.8 mg/dL (ref 0.4–1.2)
GFR: 72.63 mL/min (ref >60.00)
Glucose, Bld: 92 mg/dL (ref 70–99)
Potassium: 4.7 mEq/L (ref 3.5–5.1)
Sodium: 139 mEq/L (ref 135–145)

## 2013-11-19 LAB — HEPATIC FUNCTION PANEL
ALT: 13 U/L (ref 0–35)
AST: 15 U/L (ref 0–37)
Albumin: 3.7 g/dL (ref 3.5–5.2)
Alkaline Phosphatase: 66 U/L (ref 39–117)
Bilirubin, Direct: 0.1 mg/dL (ref 0.0–0.3)
Total Bilirubin: 0.5 mg/dL (ref 0.2–1.2)
Total Protein: 6.9 g/dL (ref 6.0–8.3)

## 2013-11-19 LAB — CBC WITH DIFFERENTIAL/PLATELET
Basophils Absolute: 0.1 10*3/uL (ref 0.0–0.1)
Basophils Relative: 0.9 % (ref 0.0–3.0)
Eosinophils Absolute: 0.2 10*3/uL (ref 0.0–0.7)
Eosinophils Relative: 1.8 % (ref 0.0–5.0)
HCT: 39.8 % (ref 36.0–46.0)
Hemoglobin: 13.2 g/dL (ref 12.0–15.0)
Lymphocytes Relative: 33.3 % (ref 12.0–46.0)
Lymphs Abs: 2.8 10*3/uL (ref 0.7–4.0)
MCHC: 33.1 g/dL (ref 30.0–36.0)
MCV: 90.7 fl (ref 78.0–100.0)
Monocytes Absolute: 0.8 10*3/uL (ref 0.1–1.0)
Monocytes Relative: 9.5 % (ref 3.0–12.0)
Neutro Abs: 4.6 10*3/uL (ref 1.4–7.7)
Neutrophils Relative %: 54.5 % (ref 43.0–77.0)
Platelets: 270 10*3/uL (ref 150.0–400.0)
RBC: 4.38 Mil/uL (ref 3.87–5.11)
RDW: 13.7 % (ref 11.5–15.5)
WBC: 8.5 10*3/uL (ref 4.0–10.5)

## 2013-11-19 LAB — LIPASE: Lipase: 23 U/L (ref 11.0–59.0)

## 2013-11-19 MED ORDER — IOHEXOL 300 MG/ML  SOLN
100.0000 mL | Freq: Once | INTRAMUSCULAR | Status: AC | PRN
  Administered 2013-11-19: 100 mL via INTRAVENOUS

## 2013-11-19 NOTE — Assessment & Plan Note (Signed)
Prescribed Asacol 3 times daily for chronic bloat and diarrhea symptoms, but unable to afford same with insurance changes (new disability since 01/2013) - on no tx since 05/2013 New abd pain x 3 days - check labs and CT now - ?abd and/or pred depending on results Compliance issues due to cost reviewed - ?possible [facility] Advised to resume working with gastroenterology on same

## 2013-11-19 NOTE — Assessment & Plan Note (Signed)
Dx confirmed 10/2013 PFTs - to see pulm Wert tomorrow On Alb prn only at this time - reports symptoms improved with same Reconsider other inhaled therapy, but limited by cost at this time. Defer to pulmonary for further recommendations Patient understands importance of smoking cessation as related to symptom management and disease progression

## 2013-11-19 NOTE — Progress Notes (Signed)
Subjective:    Patient ID: Erica [name], female    DOB: 1950-12-23, 63 y.o.   MRN: [id number]  HPI  Patient is here for follow up - SOB, notes new abdominal pain x 3 days - ?early obst recurrence or Crohn flare Also reviewed chronic medical issues and interval medical events  Past Medical History  Diagnosis Date  . Takotsubo syndrome 12/2008  . ANEMIA   . OSTEOARTHRITIS   . URINARY INCONTINENCE   . ADENOCARCINOMA, COLON, HX OF 03/2009  . BACK PAIN, LUMBAR   . HYPERTENSION   . HYPERLIPIDEMIA   . GERD   . Crohn's  02/2010    ileal ulcers, intol of entercort  . Microscopic hematuria     chronic  . CHF (congestive heart failure)   . History of transfusion of whole blood   . Obstruction of intestine or colon     adhesions  . COPD (chronic obstructive pulmonary disease) with emphysema     Review of Systems  Constitutional: Negative for fever and fatigue.  Respiratory: Negative for cough and wheezing. Shortness of breath: but improved with Alb MDI prn.   Cardiovascular: Negative for chest pain.  Gastrointestinal: Positive for nausea and abdominal pain (x 3 days, but improved). Negative for diarrhea and constipation.       Objective:   Physical Exam  BP 130/80  Pulse 57  Temp(Src) 98.1 F (36.7 C) (Oral)  Ht 5' 2.5" (1.588 m)  Wt 154 lb 8 oz (70.081 kg)  BMI 27.79 kg/m2  SpO2 97% Wt Readings from Last 3 Encounters:  11/19/13 154 lb 8 oz (70.081 kg)  11/03/13 157 lb 1.9 oz (71.269 kg)  09/03/13 156 lb (70.761 kg)   Constitutional: She appears well-developed and well-nourished. No distress.  Neck: Normal range of motion. Neck supple. No JVD present. No thyromegaly present.  Cardiovascular: Normal rate, regular rhythm and normal heart sounds.  No murmur heard. No BLE edema. Pulmonary/Chest: Effort normal and breath sounds diminished but no acute respiratory distress. She has no wheezes.  Abdomen: S ND, mild LLQ tenderness but no R/G Psychiatric: She has a normal  mood and affect. Her behavior is normal. Judgment and thought content normal.   Lab Results  Component Value Date   WBC 9.0 07/18/2013   HGB 13.7 07/18/2013   HCT 41.1 07/18/2013   PLT 251.0 07/18/2013   GLUCOSE 95 07/18/2013   CHOL 225* 09/30/2012   TRIG 238.0* 09/30/2012   HDL 54.40 09/30/2012   LDLDIRECT 138.5 09/30/2012   LDLCALC 101* 02/22/2011   ALT 14 07/18/2013   AST 17 07/18/2013   NA 141 07/18/2013   K 5.1 07/18/2013   CL 106 07/18/2013   CREATININE 0.8 07/18/2013   BUN 12 07/18/2013   CO2 28 07/18/2013   TSH 0.82 07/18/2013   INR 0.95 03/29/2010   HGBA1C  Value: 5.4 (NOTE) The ADA recommends the following therapeutic goal for glycemic control related to Hgb A1c measurement: Goal of therapy: <6.5 Hgb A1c  Reference: American Diabetes Association: Clinical Practice Recommendations 2010, Diabetes Care, 2010, 33: (Suppl  1). 02/10/2009    Dg Chest 2 View  01/22/2013   CLINICAL DATA:  Shortness of breath, follow-up pneumonia  EXAM: CHEST  2 VIEW  COMPARISON:  06/22/2012  FINDINGS: Lungs are essentially clear. No focal consolidation. No pleural effusion or pneumothorax.  Heart is normal in size.  Mild degenerative changes of the visualized thoracolumbar spine.  IMPRESSION: No evidence of acute cardiopulmonary disease.   Electronically Signed  By: Julian Hy M.D.   On: 01/22/2013 15:23       Assessment & Plan:   Acute abdominal pain, question Crohn's flare versus infection versus other. History of obstruction reviewed. Check labs and CT today -treatment to depend on these findings  Problem List Items Addressed This Visit   Cigarette smoker     5 minutes today spent counseling patient on unhealthy effects of continued tobacco abuse and encouragement of cessation including medical options available to help the patient quit smoking. Currently using nicotine patches and reviewed ongoing efforts to commit to same    COPD (chronic obstructive pulmonary disease) with emphysema     Dx  confirmed 10/2013 PFTs - to see pulm Wert tomorrow On Alb prn only at this time - reports symptoms improved with same Reconsider other inhaled therapy, but limited by cost at this time. 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 - Primary     Prescribed Asacol 3 times daily for chronic bloat and diarrhea symptoms, but unable to afford same with insurance changes (new disability since 01/2013) - on no tx since 05/2013 New abd pain x 3 days - check labs and CT now - ?abd and/or pred depending on results Compliance issues due to cost reviewed - ?possible Imuran Advised to resume working with gastroenterology on same    Relevant Orders      Basic metabolic panel      CBC with Differential      Hepatic function panel      CT Abdomen Pelvis W Contrast    Other Visit Diagnoses   Abdominal pain, epigastric        Relevant Orders       Basic metabolic panel       CBC with Differential       Hepatic function panel       Lipase       CT Abdomen Pelvis W Contrast

## 2013-11-19 NOTE — Assessment & Plan Note (Signed)
5 minutes today spent counseling patient on unhealthy effects of continued tobacco abuse and encouragement of cessation including medical options available to help the patient quit smoking. Currently using nicotine patches and reviewed ongoing efforts to commit to same

## 2013-11-19 NOTE — Progress Notes (Signed)
Pre visit review using our clinic review tool, if applicable. No additional management support is needed unless otherwise documented below in the visit note. 

## 2013-11-19 NOTE — Patient Instructions (Signed)
It was good to see you today.  We have reviewed your prior records including labs and tests today  Test(s) ordered today. Your results will be released to [place] (or called to you) after review, usually within 72hours after test completion. If any changes need to be made, you will be notified at that same time.  Will arrange for a CT scan abdomen and pelvis today - further treatment will depend on these results  Medications reviewed and updated, no changes recommended at this time.  Keep appointment with Dr. Melvyn Novas tomorrow as scheduled

## 2013-11-20 ENCOUNTER — Telehealth: Payer: Self-pay | Admitting: Internal Medicine

## 2013-11-20 ENCOUNTER — Ambulatory Visit (INDEPENDENT_AMBULATORY_CARE_PROVIDER_SITE_OTHER)
Admission: RE | Admit: 2013-11-20 | Discharge: 2013-11-20 | Disposition: A | Discharge status: Home / Self Care | Payer: Medicare HMO | Source: Ambulatory Visit | Attending: Internal Medicine | Admitting: Internal Medicine

## 2013-11-20 ENCOUNTER — Ambulatory Visit (INDEPENDENT_AMBULATORY_CARE_PROVIDER_SITE_OTHER): Payer: Medicare HMO | Admitting: Internal Medicine

## 2013-11-20 ENCOUNTER — Encounter: Payer: Self-pay | Admitting: Internal Medicine

## 2013-11-20 VITALS — BP 130/80 | HR 76 | Ht 62.0 in | Wt 156.0 lb

## 2013-11-20 DIAGNOSIS — K50818 Crohn's disease of both small and large intestine with other complication: Secondary | ICD-10-CM

## 2013-11-20 DIAGNOSIS — J438 Other emphysema: Secondary | ICD-10-CM

## 2013-11-20 DIAGNOSIS — J432 Centrilobular emphysema: Secondary | ICD-10-CM

## 2013-11-20 DIAGNOSIS — F17210 Nicotine dependence, cigarettes, uncomplicated: Secondary | ICD-10-CM

## 2013-11-20 DIAGNOSIS — F17200 Nicotine dependence, unspecified, uncomplicated: Secondary | ICD-10-CM

## 2013-11-20 MED ORDER — PREDNISONE 10 MG PO TABS: 40.0000 mg | ORAL_TABLET | Freq: Every day | ORAL | Status: DC

## 2013-11-20 NOTE — Telephone Encounter (Signed)
Having upper abd pain Saw PCP and CT showed jejunal intussusception  She says she is better, not vomiting   Will start prednisone 40 mg daily for Crohn's  She needs appt w/ me in 1-2 weeks  Will determine next step re: imaging follow-up and let her know

## 2013-11-20 NOTE — Progress Notes (Signed)
   Subjective:    Patient ID: Erica [name], female    DOB: [birth year]   MRN: [id number]  HPI  28 yowf  actively trying to quit at referral by Dr Basilio Cairo to pulmonary clinic 11/20/2013 with documented GOLD II COPD.  11/20/2013 1st [facility] office visit/ Erica [name]  Chief Complaint  Patient presents with  . PULMONARY CONSULT    Referred by Dr Asa Lente for eval of Emphysema/COPD. Recent PFT.    doe yardwork only  Some am cough and congestion > clear mucus Uses saba maybe once a day in am   No obvious other patterns in day to day or daytime variabilty or assoc   cp or chest tightness, subjective wheeze overt sinus or hb symptoms. No unusual exp hx or h/o childhood pna/ asthma or knowledge of premature birth.  Sleeping ok without nocturnal  or early am exacerbation  of respiratory  c/o's or need for noct saba. Also denies any obvious fluctuation of symptoms with weather or environmental changes or other aggravating or alleviating factors except as outlined above   Current Medications, Allergies, Complete Past Medical History, Past Surgical History, Family History, and Social History were reviewed in [employer] record.           Review of Systems  Constitutional: Negative for fever and unexpected weight change.  HENT: Positive for congestion and sneezing. Negative for dental problem, ear pain, nosebleeds, postnasal drip, rhinorrhea, sinus pressure, sore throat and trouble swallowing.   Eyes: Negative for redness and itching.  Respiratory: Positive for cough and shortness of breath. Negative for chest tightness and wheezing.   Cardiovascular: Negative for palpitations and leg swelling.  Gastrointestinal: Negative for nausea and vomiting.  Genitourinary: Negative for dysuria.  Musculoskeletal: Positive for arthralgias and joint swelling.  Skin: Negative for rash ( itching).  Neurological: Negative for headaches.  Hematological: Does not bruise/bleed easily.    Psychiatric/Behavioral: Negative for dysphoric mood. The patient is not nervous/anxious.        Objective:   Physical Exam  amb wf nad   Wt Readings from Last 3 Encounters:  11/20/13 156 lb (70.761 kg)  11/19/13 154 lb 8 oz (70.081 kg)  11/03/13 157 lb 1.9 oz (71.269 kg)      HEENT mild turbinate edema.  Oropharynx no thrush or excess pnd or cobblestoning.  No JVD or cervical adenopathy. Mild accessory muscle hypertrophy. Trachea midline, nl thryroid. Chest was hyperinflated by percussion with diminished breath sounds and moderate increased exp time without wheeze. [name] sign positive at mid inspiration. Regular rate and rhythm without murmur gallop or rub or increase P2 or edema.  Abd: no hsm, nl excursion. Ext warm without cyanosis or clubbing.        CXR  11/20/2013 :  There is no edema or consolidation. The heart size and pulmonary  vascularity are normal. No adenopathy. No bone lesions.       Assessment & Plan:

## 2013-11-20 NOTE — Patient Instructions (Signed)
Only use your albuterol as a rescue medication to be used if you can't catch your breath by resting or doing a relaxed purse lip breathing pattern.  - The less you use it, the better it will work when you need it. - Ok to use up to 2 puffs  every 4 hours if you must but call for immediate appointment if use goes up over your usual need - Don't leave home without it !!  (think of it like the spare tire for your car)   The key is to stop smoking completely before smoking completely stops you!   Please remember to go to the   x-ray department downstairs for your tests - we will call you with the results when they are available.

## 2013-11-20 NOTE — Assessment & Plan Note (Signed)
Jejunal intussusception Start prednisone F/u office

## 2013-11-20 NOTE — Telephone Encounter (Signed)
Spoke with patient and scheduled OV 12/10/13 at 9:15 AM.

## 2013-11-21 ENCOUNTER — Telehealth: Payer: Self-pay | Admitting: Internal Medicine

## 2013-11-21 NOTE — Assessment & Plan Note (Signed)

## 2013-11-21 NOTE — Telephone Encounter (Signed)
Notes Recorded by Tanda Rockers, MD on 11/21/2013 at 1:32 PM Call pt: Reviewed cxr and no acute change so no change in recommendations made at ov   I spoke with patient about results and she verbalized understanding and had no questions

## 2013-11-21 NOTE — Telephone Encounter (Signed)
Would like to talk about CAT scan.  Dr. Carlean Purl did call her about the results but wants to talk to Dr. Asa Lente.

## 2013-11-21 NOTE — Assessment & Plan Note (Signed)
PFT 10/15/2013 FEV1  1.35 (58%) and ratio is 56% and no change p B2 and dlco 73%    DDX of  difficult airways management all start with A and  include Adherence, Ace Inhibitors, Acid Reflux, Active Sinus Disease, Alpha 1 Antitripsin deficiency, Anxiety masquerading as Airways dz,  ABPA,  allergy(esp in young), Aspiration (esp in elderly), Adverse effects of DPI,  Active smokers, plus two Bs  = Bronchiectasis and Beta blocker use..and one C= CHF   Adherence is always the initial "prime suspect" and is a multilayered concern that requires a "trust but verify" approach in every patient - starting with knowing how to use medications, especially inhalers, correctly, keeping up with refills and understanding the fundamental difference between maintenance and prns vs those medications only taken for a very short course and then stopped and not refilled.  The proper method of use, as well as anticipated side effects, of a metered-dose inhaler are discussed and demonstrated to the patient. Improved effectiveness after extensive coaching during this visit to a level of approximately  90%  Active smoking > discussed separately   At this point given her minimal symptoms which are addressed with low doses of prn saba all she needs to do is focus on stopping smoking. Should her doe worsen or start having aecopd then [place] or ics/laba should be added

## 2013-11-21 NOTE — Telephone Encounter (Signed)
Pt given result in easy to understand language and confirmed understanding of results and MD advise.

## 2013-11-27 ENCOUNTER — Encounter: Payer: Self-pay | Admitting: Family Medicine

## 2013-11-27 ENCOUNTER — Other Ambulatory Visit (INDEPENDENT_AMBULATORY_CARE_PROVIDER_SITE_OTHER): Payer: Medicare HMO

## 2013-11-27 ENCOUNTER — Ambulatory Visit (INDEPENDENT_AMBULATORY_CARE_PROVIDER_SITE_OTHER): Payer: Managed Care, Other (non HMO) | Admitting: Family Medicine

## 2013-11-27 VITALS — BP 110/76 | HR 71 | Ht 62.5 in | Wt 157.0 lb

## 2013-11-27 DIAGNOSIS — M1710 Unilateral primary osteoarthritis, unspecified knee: Secondary | ICD-10-CM

## 2013-11-27 DIAGNOSIS — M25512 Pain in left shoulder: Secondary | ICD-10-CM

## 2013-11-27 DIAGNOSIS — M75122 Complete rotator cuff tear or rupture of left shoulder, not specified as traumatic: Secondary | ICD-10-CM

## 2013-11-27 DIAGNOSIS — M75120 Complete rotator cuff tear or rupture of unspecified shoulder, not specified as traumatic: Secondary | ICD-10-CM

## 2013-11-27 DIAGNOSIS — M25519 Pain in unspecified shoulder: Secondary | ICD-10-CM

## 2013-11-27 DIAGNOSIS — M25569 Pain in unspecified knee: Secondary | ICD-10-CM

## 2013-11-27 DIAGNOSIS — M1712 Unilateral primary osteoarthritis, left knee: Secondary | ICD-10-CM

## 2013-11-27 DIAGNOSIS — M25562 Pain in left knee: Secondary | ICD-10-CM

## 2013-11-27 HISTORY — DX: Complete rotator cuff tear or rupture of left shoulder, not specified as traumatic: M75.122

## 2013-11-27 MED ORDER — DICLOFENAC SODIUM 2 % TD SOLN: TRANSDERMAL | Status: DC

## 2013-11-27 NOTE — Assessment & Plan Note (Signed)
Patient has had significant amount of arthritis previously. Patient does have end-stage osteoarthritic changes of this knee. Patient though would like to avoid surgery. Today I did not want to do an injection instead we'll do conservative therapy. Patient was given a brace was fitted by me today. We discussed an icing regimen of topical anti-inflammatories. Patient is to try this as well as a home exercise and come back and see me again in 3-4 weeks. Continuing to have trouble  consider repeat x-rays as well as corticosteroid injection.

## 2013-11-27 NOTE — Progress Notes (Signed)
[name] [address] [place], [place] [address] Phone: [phone] Subjective:    I'm seeing this patient by the request  of:  Erica Grant, MD   CC: left shoulder pain.   XNA:TFTDDUKGUR Erica [name] is a 63 y.o. female coming in with complaint of left shoulder and left knee pain.  Left shoulder pain patient has had for multiple years. Patient has noticed that it has increased in severity over the course of time. Patient has trouble lifting her arm up above her head or behind her back. Patient also states that there is a mild weakness with this. Mild radiation down her arm. States that the severity of 8/10. Patient does get her cottage from another provider and take some seldomly but has needed a more frequently. Patient was severity of 9/10. Denies any true injury.  Patient is also had left knee pain for quite some time. Patient was told that she does have severe arthritis in her left knee. Patient has had right knee replacement previously. Patient states that she is a dull aching pain at all time of a sharp pain with certain movements. Patient states it does stop her from doing certain activities. Denies any nighttime awakening, denies any weakness. States that she's just looking for something so she can avoid surgery if possible.     Past medical history, social, surgical and family history all reviewed in electronic medical record.   Review of Systems: No headache, visual changes, nausea, vomiting, diarrhea, constipation, dizziness, abdominal pain, skin rash, fevers, chills, night sweats, weight loss, swollen lymph nodes, body aches, joint swelling, muscle aches, chest pain, shortness of breath, mood changes.   Objective Blood pressure 110/76, pulse 71, height 5' 2.5" (1.588 m), weight 157 lb (71.215 kg), SpO2 95.00%.  General: No apparent distress alert and oriented x3 mood and affect normal, dressed appropriately.  HEENT: Pupils equal,  extraocular movements intact  Respiratory: Patient's speak in full sentences and does not appear short of breath  Cardiovascular: No lower extremity edema, non tender, no erythema  Skin: Warm dry intact with no signs of infection or rash on extremities or on axial skeleton.  Abdomen: Soft nontender  Neuro: Cranial nerves II through XII are intact, neurovascularly intact in all extremities with 2+ DTRs and 2+ pulses.  Lymph: No lymphadenopathy of posterior or anterior cervical chain or axillae bilaterally.  Gait normal with good balance and coordination.  MSK:  Non tender with full range of motion and good stability and symmetric strength and tone of  elbows, wrist, hip, and ankles bilaterally.  Knee: Left Normal to inspection with no erythema or effusion or obvious bony abnormalities. Moderate tenderness to palpation over the medial and lateral joint line ROM full in flexion and extension and lower leg rotation. Ligaments with solid consistent endpoints including ACL, PCL, LCL, MCL. Negative Mcmurray's, Apley's, and Thessalonian tests. painful patellar compression. Patellar glide with moderate crepitus. Patellar and quadriceps tendons unremarkable. Hamstring and quadriceps strength is normal.  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 Palpation is normal with no tenderness over AC joint or bicipital groove. ROM is full in all planes passively. Rotator cuff strength 4/5 compared to contralateral side signs of impingement with positive Neer and Hawkin's tests, but negative empty can sign. Speeds and Yergason's tests normal. No labral pathology noted with negative Obrien's, negative clunk and good stability. Normal scapular function observed. No painful arc and no drop arm sign.  No apprehension sign Contralateral side unremarkable  MSK US performed of: left This study was ordered, performed, and interpreted by Charlann Boxer  D.O.  Shoulder:   Supraspinatus:  Appears normal on long and transverse views, Bursal bulge seen with shoulder abduction on impingement view. Infraspinatus:  Appears normal on long and transverse views. Significant increase in Doppler flow Subscapularis:  Has what appears to be a full-thickness tear of the subscapularis with minimal retraction Positive bursa Teres Minor:  Appears normal on long and transverse views. AC joint:  Capsule undistended, no geyser sign. Glenohumeral Joint:  Appears normal without effusion. Glenoid Labrum:  Intact without visualized tears. Biceps Tendon:  Appears normal on long and transverse views, no fraying of tendon, tendon located in intertubercular groove, no subluxation with shoulder internal or external rotation.  Impression: Subacromial bursitis name full-thickness tear of the subscapularis with no significant retraction  Procedure: Real-time Ultrasound Guided Injection of left glenohumeral joint Device: GE Logiq E  Ultrasound guided injection is preferred based studies that show increased duration, increased effect, greater accuracy, decreased procedural pain, increased response rate with ultrasound guided versus blind injection.  Verbal informed consent obtained.  Time-out conducted.  Noted no overlying erythema, induration, or other signs of local infection.  Skin prepped in a sterile fashion.  Local anesthesia: Topical Ethyl chloride.  With sterile technique and under real time ultrasound guidance:  Joint visualized.  23g 1  inch needle inserted posterior approach. Pictures taken for needle placement. Patient did have injection of 2 cc of 1% lidocaine, 2 cc of 0.5% Marcaine, and 1.0 cc of Kenalog 40 mg/dL. Completed without difficulty  Pain immediately resolved suggesting accurate placement of the medication.  Advised to call if fevers/chills, erythema, induration, drainage, or persistent bleeding.  Images permanently stored and available for review  in the ultrasound unit.  Impression: Technically successful ultrasound guided injection.     Impression and Recommendations:     This case required medical decision making of moderate complexity.

## 2013-11-27 NOTE — Assessment & Plan Note (Signed)
Patient does have what appears to be a full rotator cuff this tear. Patient has had this tear for quite some time. It appears that the patient also has some mild atrophy. Patient was given an injection today which I hope will be beneficial. Patient has had a significant amount of health problems recently. Patient was given home exercise program, icing protocol, and topical anti-inflammatories. We discussed that patient would heal better if she did stop smoking. Patient will try these interventions and come back and see me again in 3 weeks for further evaluation.

## 2013-11-27 NOTE — Patient Instructions (Signed)
Good to meet you Ice 20 minutes 2 times daily. Usually after activity and before bed. Exercises 3 times a week. Alternate the knee and the shoulder Try the brace for you knee with activity.  Pennsaid up to 2 times daily on knee and shoulder.  Stop if it hurts your stomach.  Take tylenol 650 mg three times a day is the best evidence based medicine we have for arthritis. DO NOT TAKE IF YOU TAKE YOU PAIN MEDICINE Glucosamine sulfate 74m twice a day is a supplement that has been shown to help moderate to severe arthritis. Vitamin D 100 IU daily Fish oil 2 grams daily.  Tumeric 5054mtwice daily.  Come back in 3 weeks to see how you are and we could do injection in your knee.

## 2013-12-10 ENCOUNTER — Ambulatory Visit: Payer: Medicare HMO | Admitting: Internal Medicine

## 2013-12-17 ENCOUNTER — Ambulatory Visit: Payer: Medicare HMO | Admitting: Family Medicine

## 2013-12-24 ENCOUNTER — Telehealth: Payer: Self-pay | Admitting: Internal Medicine

## 2013-12-24 ENCOUNTER — Other Ambulatory Visit: Payer: Self-pay

## 2013-12-24 ENCOUNTER — Telehealth: Payer: Self-pay

## 2013-12-24 MED ORDER — HYDROCODONE-ACETAMINOPHEN 5-325 MG PO TABS: 1.0000 | ORAL_TABLET | Freq: Four times a day (QID) | ORAL | Status: DC | PRN

## 2013-12-24 NOTE — Telephone Encounter (Signed)
Patient is requesting refill on hydrocodone

## 2013-12-24 NOTE — Telephone Encounter (Signed)
Pt informed that rx is ready for pick here at the office

## 2014-01-13 ENCOUNTER — Emergency Department (HOSPITAL_COMMUNITY): Payer: Medicare HMO

## 2014-01-13 ENCOUNTER — Emergency Department (HOSPITAL_COMMUNITY)
Admission: EM | Admit: 2014-01-13 | Discharge: 2014-01-13 | Disposition: A | Discharge status: Home / Self Care | Payer: Medicare HMO | Attending: Emergency Medicine | Admitting: Emergency Medicine

## 2014-01-13 ENCOUNTER — Encounter (HOSPITAL_COMMUNITY): Payer: Self-pay | Admitting: Emergency Medicine

## 2014-01-13 DIAGNOSIS — Z7952 Long term (current) use of systemic steroids: Secondary | ICD-10-CM | POA: Insufficient documentation

## 2014-01-13 DIAGNOSIS — Z8719 Personal history of other diseases of the digestive system: Secondary | ICD-10-CM | POA: Insufficient documentation

## 2014-01-13 DIAGNOSIS — M1990 Unspecified osteoarthritis, unspecified site: Secondary | ICD-10-CM | POA: Insufficient documentation

## 2014-01-13 DIAGNOSIS — Z720 Tobacco use: Secondary | ICD-10-CM | POA: Insufficient documentation

## 2014-01-13 DIAGNOSIS — Z85038 Personal history of other malignant neoplasm of large intestine: Secondary | ICD-10-CM | POA: Diagnosis not present

## 2014-01-13 DIAGNOSIS — J439 Emphysema, unspecified: Secondary | ICD-10-CM | POA: Insufficient documentation

## 2014-01-13 DIAGNOSIS — Z8639 Personal history of other endocrine, nutritional and metabolic disease: Secondary | ICD-10-CM | POA: Insufficient documentation

## 2014-01-13 DIAGNOSIS — I10 Essential (primary) hypertension: Secondary | ICD-10-CM | POA: Insufficient documentation

## 2014-01-13 DIAGNOSIS — M542 Cervicalgia: Secondary | ICD-10-CM

## 2014-01-13 DIAGNOSIS — Z862 Personal history of diseases of the blood and blood-forming organs and certain disorders involving the immune mechanism: Secondary | ICD-10-CM | POA: Diagnosis not present

## 2014-01-13 DIAGNOSIS — Z79899 Other long term (current) drug therapy: Secondary | ICD-10-CM | POA: Diagnosis not present

## 2014-01-13 DIAGNOSIS — I509 Heart failure, unspecified: Secondary | ICD-10-CM | POA: Diagnosis not present

## 2014-01-13 DIAGNOSIS — M62838 Other muscle spasm: Secondary | ICD-10-CM | POA: Diagnosis not present

## 2014-01-13 HISTORY — DX: Unspecified rotator cuff tear or rupture of unspecified shoulder, not specified as traumatic: M75.100

## 2014-01-13 MED ORDER — DIAZEPAM 5 MG PO TABS
5.0000 mg | ORAL_TABLET | Freq: Once | ORAL | Status: AC
  Administered 2014-01-13: 5 mg via ORAL
  Filled 2014-01-13: qty 1

## 2014-01-13 MED ORDER — DIAZEPAM 5 MG PO TABS: ORAL_TABLET | ORAL | Status: DC

## 2014-01-13 MED ORDER — PREDNISONE 20 MG PO TABS: ORAL_TABLET | ORAL | Status: DC

## 2014-01-13 NOTE — Discharge Instructions (Signed)
Please read and follow all provided instructions.  Your diagnoses today include:  1. Neck pain   2. Trapezius muscle spasm     Tests performed today include:  Vital signs - see below for your results today  X-ray of neck - hints at disc narrowing at C5/C6 vertebra level in neck  Medications prescribed:   Valium - muscle relaxer medication  DO NOT drive or perform any activities that require you to be awake and alert because this medicine can make you drowsy.    Prednisone - steroid medicine   It is best to take this medication in the morning to prevent sleeping problems. If you are diabetic, monitor your blood sugar closely and stop taking Prednisone if blood sugar is over 300. Take with food to prevent stomach upset.   Take any prescribed medications only as directed.  Home care instructions:   Follow any educational materials contained in this packet  Continue home hydrocodone as prescribed  Please rest, use ice or heat on your back for the next several days  Do not lift, push, pull anything more than 10 pounds for the next week  Follow-up instructions: Please follow-up with your primary care provider in the next 1 week for further evaluation of your symptoms.   Return instructions:  SEEK IMMEDIATE MEDICAL ATTENTION IF YOU HAVE:  New numbness, tingling, weakness, or problem with the use of your arms or legs  Severe back pain not relieved with medications  Loss control of your bowels or bladder  Increasing pain in any areas of the body (such as chest or abdominal pain)  Shortness of breath, dizziness, or fainting.   Worsening nausea (feeling sick to your stomach), vomiting, fever, or sweats  Any other emergent concerns regarding your health   Additional Information:  Your vital signs today were: BP 132/78   Pulse 85   Temp(Src) 98.7 F (37.1 C) (Oral)   Resp 17   SpO2 93% If your blood pressure (BP) was elevated above 135/85 this visit, please have this  repeated by your doctor within one month. --------------

## 2014-01-13 NOTE — ED Provider Notes (Signed)
CSN: [id number]     Arrival date & time 01/13/14  1612 History   First MD Initiated Contact with Patient 01/13/14 1644     Chief Complaint  Patient presents with  . Neck Pain  . Shoulder Pain     (Consider location/radiation/quality/duration/timing/severity/associated sxs/prior Treatment) HPI Comments: Patient with history of chronic pain, no previous neck problems -- presents with complaint of right neck pain and decreased range of motion which began upon waking this morning. Patient described the pain as a stiffness. Pain radiates down into her right upper arm and right hand. Pain has worsened throughout the day. She denies chest pain or shortness of breath. She has not had pain like this in the past. Patient denies warning symptoms of back pain including: fecal incontinence, urinary retention or overflow incontinence, night sweats, waking from sleep with back pain, unexplained fevers or weight loss, h/o cancer, IVDU, recent trauma. She has taken Vicodin and Flexeril without relief. No numbness, tingling. No h/o PE. No injury.    Patient is a 63 y.o. female presenting with neck pain and shoulder pain. The history is provided by the patient.  Neck Pain Associated symptoms: no fever, no numbness and no weakness   Shoulder Pain Associated symptoms include neck pain. Pertinent negatives include no fever, numbness or weakness.    Past Medical History  Diagnosis Date  . Takotsubo syndrome 12/2008  . ANEMIA   . OSTEOARTHRITIS   . URINARY INCONTINENCE   . ADENOCARCINOMA, COLON, HX OF 03/2009  . BACK PAIN, LUMBAR   . HYPERTENSION   . HYPERLIPIDEMIA   . GERD   . Crohn's  02/2010    ileal ulcers, intol of entercort--refuses treatment  . Microscopic hematuria     chronic  . CHF (congestive heart failure)   . History of transfusion of whole blood   . Obstruction of intestine or colon     adhesions  . COPD (chronic obstructive pulmonary disease) with emphysema   . Rotator cuff tear     Past Surgical History  Procedure Laterality Date  . Cesarean section      x 3  . Abdominal hysterectomy  1994  . Colon surgery  03/2009    hemicolectomy colon cancer  . Cholecystectomy  1995  . Tonsillectomy  1952  . Appendectomy  1964  . Hand surgery  1991  . Knee surgery  1981    bilateral  . Right knee replacement  03/2010  . Upper gastrointestinal endoscopy  05/16/2010    normal  . Colonoscopy w/ biopsies and polypectomy  01/24/2011    (Crohn's)ileitis, internal hemorrhoids  . Breast surgery     Family History  Problem Relation Age of Onset  . Throat cancer Mother   . Colon cancer Father   . Hypertension Father   . Heart disease Father   . Kidney disease Father   . Colon cancer Sister   . Arthritis Other     Parent, other relative   History  Substance Use Topics  . Smoking status: Current Every Day Smoker -- 0.50 packs/day for 48 years    Types: Cigarettes  . Smokeless tobacco: Never Used     Comment: Patient cut down to a pack instead of 2 //Married, lives with spouse. Originally from [place]. 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 Alcohol Use: No   OB History   Grav Para Term Preterm Abortions TAB SAB Ect Mult Living  Review of Systems  Constitutional: Negative for fever and unexpected weight change.  Gastrointestinal: Negative for constipation.       Negative for fecal incontinence.   Genitourinary:       Negative for urinary incontinence or retention.  Musculoskeletal: Positive for neck pain. Negative for back pain.  Neurological: Negative for weakness and numbness.       Denies saddle paresthesias.    Allergies  Morphine  Home Medications   Prior to Admission medications   Medication Sig Start Date End Date Taking? Authorizing Provider  albuterol (PROVENTIL HFA;VENTOLIN HFA) 108 (90 BASE) MCG/ACT inhaler Inhale 2 puffs into the lungs daily as needed for wheezing or  shortness of breath (shortness of breath).   Yes Historical Provider, MD  cyclobenzaprine (FLEXERIL) 10 MG tablet Take 10 mg by mouth 3 (three) times daily as needed for muscle spasms (muscle pain).   Yes Historical Provider, MD  HYDROcodone-acetaminophen (NORCO/VICODIN) 5-325 MG per tablet Take 1 tablet by mouth every 6 (six) hours as needed for moderate pain (pain).   Yes Historical Provider, MD  metoprolol tartrate (LOPRESSOR) 25 MG tablet Take 25 mg by mouth 2 (two) times daily.   Yes Historical Provider, MD  nicotine (NICODERM CQ - DOSED IN MG/24 HOURS) 14 mg/24hr patch Place 1 patch (14 mg total) onto the skin daily. 10/24/13  Yes Rowe Clack, MD  diazepam (VALIUM) 5 MG tablet Take 1 tablet every 8 hours as needed for muscle spasm 01/13/14   Carlisle Cater, PA-C  predniSONE (DELTASONE) 20 MG tablet 3 Tabs PO Days 1-3, then 2 tabs PO Days 4-6, then 1 tab PO Day 7-9, then Half Tab PO Day 10-12 01/13/14   Carlisle Cater, PA-C   BP 146/95  Pulse 91  Temp(Src) 98.7 F (37.1 C) (Oral)  Resp 18  SpO2 96%  Physical Exam  Nursing note and vitals reviewed. Constitutional: She appears well-developed and well-nourished.  HENT:  Head: Normocephalic and atraumatic.  Eyes: Conjunctivae are normal.  Neck: Normal range of motion. Neck supple.  Cardiovascular:  Pulses:      Radial pulses are 2+ on the right side, and 2+ on the left side.  Pulmonary/Chest: Effort normal.  Abdominal: Soft. There is no tenderness. There is no CVA tenderness.  Musculoskeletal:       Right shoulder: She exhibits tenderness. She exhibits normal range of motion and no bony tenderness.       Left shoulder: Normal.       Right elbow: Normal.      Right wrist: Normal.       Cervical back: She exhibits decreased range of motion (rightward rotation > leftward), tenderness and spasm (R trapezius). She exhibits no bony tenderness.       Right upper arm: Normal.       Right forearm: Normal.       Right hand: She exhibits  normal range of motion. Normal sensation noted.  No step-off noted with palpation of spine.   Neurological: She is alert. She has normal strength and normal reflexes. No sensory deficit.  5/5 strength in entire lower extremities bilaterally. No sensation deficit.   Skin: Skin is warm and dry. No rash noted.  Psychiatric: She has a normal mood and affect.    ED Course  Procedures (including critical care time) Labs Review Labs Reviewed - No data to display  Imaging Review Dg Cervical Spine Complete  01/13/2014   CLINICAL DATA:  Right neck pain and stiffness  EXAM: CERVICAL SPINE  4+ VIEWS  COMPARISON:  None.  FINDINGS: Frontal, lateral, open-mouth odontoid, and bilateral oblique views were obtained. There is no fracture or spondylolisthesis. Prevertebral soft tissues and predental space regions are normal. There is slight disc space narrowing at C5-6. Other disc spaces appear intact. There is No significant exit foraminal narrowing on the oblique views. No erosive change.  IMPRESSION: Slight disc space narrowing C5-6.  No fracture or spondylolisthesis.   Electronically Signed   By: Lowella Grip M.D.   On: 01/13/2014 18:06     EKG Interpretation None      5:35 PM Patient seen and examined. Work-up initiated. Medications ordered.   Vital signs reviewed and are as follows: BP 146/95  Pulse 91  Temp(Src) 98.7 F (37.1 C) (Oral)  Resp 18  SpO2 96%  6:52 PM Pt informed of x-ray results. Exam unchanged. She has prednisone at home -- but is not currently on due to no Crohn's flare. She agrees to course. Discussed possibility of cervical radiculopathy.   We discussed that Valium is a substitute for Flexeril and she should not combine these.   She states she will f/u with her ortho doctor for recheck.   Patient urged to return with worsening symptoms or other concerns. Patient verbalized understanding and agrees with plan.    MDM   Final diagnoses:  Neck pain  Trapezius  muscle spasm   Patient with new onset neck pain and muscle spasm today. No injury. Normal neurological exam. Shooting pain down the arm suggestive of cervical radiculopathy. X-ray without significant findings. Patient also has some muscle spasm in her trapezius. Will treat symptomatically. She takes Vicodin at home on a daily basis. Will substitute Flexeril for Valium. Patient has or so with him she will followup. Discussed appropriate return instructions at bedside.    Carlisle Cater, PA-C 01/13/14 [phone]

## 2014-01-13 NOTE — ED Notes (Signed)
Pt knows not to drink/drive/operate heavy machinery with prescribed medications. Ambulatory with AVS in hand and explained in detail. No other questions/concerns.

## 2014-01-13 NOTE — ED Provider Notes (Signed)
Medical screening examination/treatment/procedure(s) were performed by non-physician practitioner and as supervising physician I was immediately available for consultation/collaboration.   EKG Interpretation None        Blanchie Dessert, MD 01/13/14 2325

## 2014-01-13 NOTE — ED Notes (Addendum)
Pt c/o R side neck pain and stiffness and R shoulder pain starting this morning.  Pain score 6/10.  Pt reports decreased range of motion in R arm.  Denies injury.  Pt sts she has taken multiple pain medications w/o relief.

## 2014-01-13 NOTE — ED Notes (Signed)
Initial contact-ambulatory with steady gait. Able to grip with right hand and move right arm and shoulder with pain described as "an electric shock." No recent injury, no obvious deformity. Took one hydrocodone at 0700 and at 1300 with no alleviation of symptoms. Neurologically intact. Awaiting MD/PA.

## 2014-01-26 ENCOUNTER — Ambulatory Visit (INDEPENDENT_AMBULATORY_CARE_PROVIDER_SITE_OTHER): Payer: Managed Care, Other (non HMO) | Admitting: Family Medicine

## 2014-01-26 ENCOUNTER — Telehealth: Payer: Self-pay | Admitting: Internal Medicine

## 2014-01-26 ENCOUNTER — Encounter: Payer: Self-pay | Admitting: Family Medicine

## 2014-01-26 VITALS — BP 108/82 | HR 84 | Ht 62.5 in | Wt 160.0 lb

## 2014-01-26 DIAGNOSIS — M7550 Bursitis of unspecified shoulder: Secondary | ICD-10-CM | POA: Insufficient documentation

## 2014-01-26 DIAGNOSIS — M62838 Other muscle spasm: Secondary | ICD-10-CM | POA: Insufficient documentation

## 2014-01-26 DIAGNOSIS — M7551 Bursitis of right shoulder: Secondary | ICD-10-CM

## 2014-01-26 DIAGNOSIS — M6248 Contracture of muscle, other site: Secondary | ICD-10-CM

## 2014-01-26 MED ORDER — GABAPENTIN 100 MG PO CAPS: 200.0000 mg | ORAL_CAPSULE | Freq: Every day | ORAL | Status: DC

## 2014-01-26 NOTE — Patient Instructions (Addendum)
Good to see you Erica [name] is your friend.  Gabapentin 282m at night Hydrocodone only if you need it Try the pennsaid 1 pump 2 times daily.  Exercises 3 times a week.  See me again in 2 weeks.

## 2014-01-26 NOTE — Telephone Encounter (Signed)
Pt came in today to see Dr. [name] and while checking in she requested a refill on her Hydrocodone.

## 2014-01-26 NOTE — Assessment & Plan Note (Signed)
Patient was given an injection today and tolerated the procedure well. We discussed icing protocol and continuing home exercises he has been doing on the contralateral side. Patient and will come back and see me again in 3-4 weeks for further evaluation.

## 2014-01-26 NOTE — Assessment & Plan Note (Signed)
Patient's neck spasm is secondary to elevated first rib. Patient did respond somewhat to osteopathic manipulation today. We also discussed icing protocol. Patient was given some topical anti-inflammatory and discussed a use half the dose which is one pump twice daily. Patient can return on will avoid her stomach. Discuss that she would likely heal faster if she stops smoking. Patient was also given a steroid injection into her shoulder for a presumed bursitis. Home exercise program was given. Patient will follow-up in 2 weeks for further evaluation. Continuing to have discomfort we may need to consider further imaging including an MRI.  Spent greater than 25 minutes with patient face-to-face and had greater than 50% of counseling including as described above in assessment and plan.

## 2014-01-26 NOTE — Progress Notes (Signed)
[name] [address] [place], [place] [address] Phone: [phone] Subjective:    CC: Neck pain  IDC:VUDTHYHOOI Erica [name] is a 63 y.o. female coming in with complaint of neck pain. Patient has an acute onset of neck pain mostly on the right side with significant stiffness the started on 01/13/2014. Patient was in such severe pain she did go to the emergency room. In the emergency room she did have x-rays. These x-rays were reviewed by me and showed some slight disc space narrowing of C5 on C6. No acute fracture noted. Patient has taken muscle relaxers as well as Vicodin without any significant improvement. Patient does have a past medical history significant for rotator cuff tear on the contralateral side bent to do very well after injection. Patient states it is similar but not nearly as bad. No radiation down the leg. Patient is been taking a muscle relaxer with some moderate improvement. Patient still rates the pain as 7 out of 10. Still able to do daily activities but has difficulty such as turning her head to the right side. Pain is severe that it is waking her up at night.     Past medical history, social, surgical and family history all reviewed in electronic medical record.   Review of Systems: No headache, visual changes, nausea, vomiting, diarrhea, constipation, dizziness, abdominal pain, skin rash, fevers, chills, night sweats, weight loss, swollen lymph nodes, body aches, joint swelling, muscle aches, chest pain, shortness of breath, mood changes.   Objective Blood pressure 108/82, pulse 84, height 5' 2.5" (1.588 m), weight 160 lb (72.576 kg), SpO2 93.00%.  General: No apparent distress alert and oriented x3 mood and affect normal, dressed appropriately.  HEENT: Pupils equal, extraocular movements intact  Respiratory: Patient's speak in full sentences and does not appear short of breath  Cardiovascular: No lower extremity edema, non tender,  no erythema  Skin: Warm dry intact with no signs of infection or rash on extremities or on axial skeleton.  Abdomen: Soft nontender  Neuro: Cranial nerves II through XII are intact, neurovascularly intact in all extremities with 2+ DTRs and 2+ pulses.  Lymph: No lymphadenopathy of posterior or anterior cervical chain or axillae bilaterally.  Gait normal with good balance and coordination.  MSK:  Non tender with full range of motion and good stability and symmetric strength and tone of  elbows, wrist, hip, knee and ankles bilaterally.  Neck: Inspection unremarkable. No palpable stepoffs. Negative Spurling's maneuver. Decreased range of motion with right-sided rotation approximately 10 as well as right-sided side bending. Patient does have pulling of the posterior aspect of her neck with flexion but does have full flexion. Grip strength and sensation normal in bilateral hands Strength good C4 to T1 distribution No sensory change to C4 to T1 Negative Hoffman sign bilaterally Reflexes normal Shoulder: Right Inspection reveals no abnormalities, atrophy or asymmetry. Palpation is normal with no tenderness over AC joint or bicipital groove. ROM is full in all planes passively. Rotator cuff strength normal throughout. signs of impingement with positive Neer and Hawkin's tests, but negative empty can sign. Speeds and Yergason's tests normal. No labral pathology noted with negative Obrien's, negative clunk and good stability. Normal scapular function observed. No painful arc and no drop arm sign. No apprehension sign Elevated first rib noted on right  MSK US performed of: Right This study was ordered, performed, and interpreted by Charlann Boxer D.O.  Shoulder:   Supraspinatus:  Appears normal on long and  transverse views, Bursal bulge seen with shoulder abduction on impingement view. Infraspinatus:  Appears normal on long and transverse views. Significant increase in Doppler  flow Subscapularis:  Appears normal on long and transverse views. Positive bursa Teres Minor:  Appears normal on long and transverse views. AC joint:  Capsule undistended, no geyser sign. Glenohumeral Joint:  Appears normal without effusion. Glenoid Labrum:  Intact without visualized tears. Biceps Tendon:  Appears normal on long and transverse views, no fraying of tendon, tendon located in intertubercular groove, no subluxation with shoulder internal or external rotation.  After informed written and verbal consent, patient was seated on exam table. Right shoulder was prepped with alcohol swab and utilizing posterior approach, patient's right glenohumeral space was injected with 4:1  marcaine 0.5%: Kenalog 28m/dL. Patient tolerated the procedure well without immediate complications.      Impression and Recommendations:     This case required medical decision making of moderate complexity.

## 2014-01-27 ENCOUNTER — Telehealth: Payer: Self-pay

## 2014-01-27 MED ORDER — HYDROCODONE-ACETAMINOPHEN 5-325 MG PO TABS: 1.0000 | ORAL_TABLET | Freq: Two times a day (BID) | ORAL | Status: DC | PRN

## 2014-01-27 NOTE — Telephone Encounter (Signed)
Pt informed rx request is ready for pick up here at the office.

## 2014-01-27 NOTE — Telephone Encounter (Signed)
Last filled 12/24/13 so will refill.

## 2014-02-05 ENCOUNTER — Encounter: Payer: Self-pay | Admitting: Internal Medicine

## 2014-02-05 ENCOUNTER — Ambulatory Visit (INDEPENDENT_AMBULATORY_CARE_PROVIDER_SITE_OTHER): Payer: Managed Care, Other (non HMO) | Admitting: Internal Medicine

## 2014-02-05 VITALS — BP 120/78 | HR 72 | Ht 62.25 in | Wt 155.2 lb

## 2014-02-05 DIAGNOSIS — K561 Intussusception: Secondary | ICD-10-CM

## 2014-02-05 DIAGNOSIS — F17210 Nicotine dependence, cigarettes, uncomplicated: Secondary | ICD-10-CM

## 2014-02-05 DIAGNOSIS — Z720 Tobacco use: Secondary | ICD-10-CM

## 2014-02-05 DIAGNOSIS — F17200 Nicotine dependence, unspecified, uncomplicated: Secondary | ICD-10-CM

## 2014-02-05 DIAGNOSIS — K50818 Crohn's disease of both small and large intestine with other complication: Secondary | ICD-10-CM

## 2014-02-05 MED ORDER — SULFASALAZINE 500 MG PO TABS: 1000.0000 mg | ORAL_TABLET | Freq: Three times a day (TID) | ORAL | Status: DC

## 2014-02-05 NOTE — Patient Instructions (Signed)
Today you have been given a printed rx for sulfasalazine.  We have also given you information on this rx.  If you decide to take it you need to come back and have bloodwork in a month after starting it.    You have been given a separate informational sheet regarding your tobacco use, the importance of quitting and local resources to help you quit. Keep up the good job of trying to stop smoking.  We are giving you information on Good rx to use.   Follow up with [place] as needed or within a year.  I appreciate the opportunity to care for you.

## 2014-02-05 NOTE — Assessment & Plan Note (Addendum)
Advised to stop completely. > 3 min duiscussion Congratulated on cutting down. Explained helsp lungs and Crohn's disease (quitting)

## 2014-02-05 NOTE — Progress Notes (Signed)
   Subjective:    Patient ID: Erica [name], female    DOB: Sep 22, 1950, 63 y.o.   MRN: [id number]  HPI Here for f/u - has Crohn's ileitis at least. Had abdominal pain and obstructive sxs in August - CT showed jejunal intussusception and proximnal sb dilation. Got better. Following up now because has been busy taking husband to [place].  No obstructive sxs now but has diarrhea sxs as usual.  Still smoking. Down to 8 cigarettes a day.  "I don't want to take anything that messes with my immune systen"  She is unable/unwilling to pay for many medications due to "poor insurance".  Medications, allergies, past medical history, past surgical history, family history and social history are reviewed and updated in the EMR.    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 Side effects discussed. Cost estimates made, Good Rx card given. Advised if she starts needs CBC in 1 month. Explained rationale of treatment is to prevent serious complications and need for surgery.  She is opposed to medications that interfere with immune system systemically and also has cost issues.  She is reminded that smoking cessation can reduce crohn's activity and symptoms.  RTC prn and w/in 1 year    Cigarette smoker Advised to stop completely. > 3 min duiscussion Congratulated on cutting down. Explained helsp lungs and Crohn's disease (quitting)

## 2014-02-05 NOTE — Assessment & Plan Note (Addendum)
Try sulfasalazine 1000 mg tid if she will. Side effects discussed. Cost estimates made, Good Rx card given. Advised if she starts needs CBC in 1 month. Explained rationale of treatment is to prevent serious complications and need for surgery.  She is opposed to medications that interfere with immune system systemically and also has cost issues.  She is reminded that smoking cessation can reduce crohn's activity and symptoms.  RTC prn and w/in 1 year

## 2014-02-09 ENCOUNTER — Ambulatory Visit: Payer: Managed Care, Other (non HMO) | Admitting: Family Medicine

## 2014-02-20 ENCOUNTER — Ambulatory Visit (INDEPENDENT_AMBULATORY_CARE_PROVIDER_SITE_OTHER): Payer: Managed Care, Other (non HMO) | Admitting: Internal Medicine

## 2014-02-20 ENCOUNTER — Encounter: Payer: Self-pay | Admitting: Internal Medicine

## 2014-02-20 VITALS — BP 140/86 | HR 86 | Temp 98.2°F | Resp 12 | Wt 155.1 lb

## 2014-02-20 DIAGNOSIS — K50818 Crohn's disease of both small and large intestine with other complication: Secondary | ICD-10-CM

## 2014-02-20 DIAGNOSIS — Z85038 Personal history of other malignant neoplasm of large intestine: Secondary | ICD-10-CM

## 2014-02-20 DIAGNOSIS — R198 Other specified symptoms and signs involving the digestive system and abdomen: Secondary | ICD-10-CM

## 2014-02-20 NOTE — Progress Notes (Signed)
Pre visit review using our clinic review tool, if applicable. No additional management support is needed unless otherwise documented below in the visit note. 

## 2014-02-20 NOTE — Patient Instructions (Signed)
See Assessment & Plan

## 2014-02-20 NOTE — Progress Notes (Signed)
   Subjective:    Patient ID: Erica [name], female    DOB: 21-Jan-1951, 63 y.o.   MRN: [id number]  HPI    History as recorded was given to me by Jaclynn Major RN, a [facility] Nurse Practitioner Student  The patient describes malodorous, fishy smelling discharge from the umbilicus for 5 days  She has not observed the drainage directly.  She had a temperature to 101 on 02/15/14. On 11/18 it was 100.5. She has no temperature today  She's been taking aspirin 500 mg twice a day  She has been applying bacitracin topically  She has associated nausea  She has diarrhea but states this is chronic  She has had a decreased appetite.   Past history includes adenocarcinoma of the colon. She also has a history regional enteritis of small intestine and possibly large intestine.  She has COPD Gold II; she continues to smoke.      Review of Systems   She's had no vomiting with the nausea  She also has a frontal headache up to level IV on a 10 scale  As of 11/18 she was having some dizziness which is resolved  She also describes a sensation of something stuck in her throat       Objective:   Physical Exam    The Nurse Practitioner Student noted localized tenderness at the periumbilical area.  She attempted to obtain some discharge for culture but none could be collected on a sterile cotton swabs.  Note:During this time I was attending a patient with an acute abscess of the right hand who required STAT Hand Surgery referral.  The patient left without being seen stating she had been waiting an hour.  She was not examined by me.       Assessment & Plan:  #1 umbilical discharge, malodorous with fever, nausea, and anorexia. in the context of a prior history of adenocarcinoma of the colon and regional enteritis. 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  The patient will be encouraged to go to the emergency room; imaging would be necessary to define the  process as no fluid could be obtained from the umbilicus with the swab.  I contacted the patient's mobile [phone]. The recording identified her as Erica [name]. I left a message encouraging her to go to the emergency room as I was concerned about a fistula between the bowel and the abdominal wall which would require CAT scan or other imaging to identify. I also told her I would send this report to her primary care physician as well as to Dr. Carlean Purl

## 2014-02-23 ENCOUNTER — Ambulatory Visit: Payer: Managed Care, Other (non HMO) | Admitting: Family Medicine

## 2014-04-17 ENCOUNTER — Encounter: Payer: Self-pay | Admitting: Internal Medicine

## 2014-04-17 ENCOUNTER — Ambulatory Visit (INDEPENDENT_AMBULATORY_CARE_PROVIDER_SITE_OTHER): Payer: Medicare HMO | Admitting: Internal Medicine

## 2014-04-17 ENCOUNTER — Ambulatory Visit (INDEPENDENT_AMBULATORY_CARE_PROVIDER_SITE_OTHER)
Admission: RE | Admit: 2014-04-17 | Discharge: 2014-04-17 | Disposition: A | Discharge status: Home / Self Care | Payer: Medicare HMO | Source: Ambulatory Visit | Attending: Internal Medicine | Admitting: Internal Medicine

## 2014-04-17 VITALS — BP 140/90 | HR 88 | Temp 98.3°F | Resp 18 | Ht 62.5 in | Wt 157.6 lb

## 2014-04-17 DIAGNOSIS — R312 Other microscopic hematuria: Secondary | ICD-10-CM

## 2014-04-17 DIAGNOSIS — R059 Cough, unspecified: Secondary | ICD-10-CM

## 2014-04-17 DIAGNOSIS — K50818 Crohn's disease of both small and large intestine with other complication: Secondary | ICD-10-CM

## 2014-04-17 DIAGNOSIS — R05 Cough: Secondary | ICD-10-CM

## 2014-04-17 DIAGNOSIS — M542 Cervicalgia: Secondary | ICD-10-CM

## 2014-04-17 DIAGNOSIS — R1030 Lower abdominal pain, unspecified: Secondary | ICD-10-CM

## 2014-04-17 DIAGNOSIS — R3129 Other microscopic hematuria: Secondary | ICD-10-CM

## 2014-04-17 LAB — POCT URINALYSIS DIPSTICK
Bilirubin, UA: NEGATIVE
Glucose, UA: NEGATIVE
Ketones, UA: NEGATIVE
Leukocytes, UA: NEGATIVE
Nitrite, UA: NEGATIVE
Protein, UA: NEGATIVE
Spec Grav, UA: 1.02
Urobilinogen, UA: 0.2
pH, UA: 5

## 2014-04-17 MED ORDER — HYDROCODONE-ACETAMINOPHEN 5-325 MG PO TABS: 1.0000 | ORAL_TABLET | Freq: Two times a day (BID) | ORAL | Status: DC | PRN

## 2014-04-17 NOTE — Progress Notes (Signed)
Pre visit review using our clinic review tool, if applicable. No additional management support is needed unless otherwise documented below in the visit note. 

## 2014-04-17 NOTE — Patient Instructions (Signed)
We will check your urine today.  We will also check the chest x-ray to make sure you do not have an infection with that cough, it will also show [place] the bones around your neck and the trachea (breathing tube).   I will send a message to Dr. Carlean Purl to see if he thinks we still need a CT of your stomach since the drainage is improving. I do not think you need antibiotics or blood work today.   Come back for your annual physical when you are able.

## 2014-04-19 NOTE — Progress Notes (Signed)
   Subjective:    Patient ID: Erica [name], female    DOB: 01-17-1951, 64 y.o.   MRN: [id number]  HPI The patient is a 64 YO female who is coming in to evaluate her belly button. She had likely infection some time ago and was not evaluated due to time waiting. She does have crohn's disease but is not able to tolerate medication. She was supposed to start sulfasalazine but was not able to tolerate it. She denies fevers chills, drainage from her belly button. She states that it has cleared up since then. She has a prominence of her collar bone on her right side and wants to get it checked out.   Review of Systems  Constitutional: Negative for fever, chills, activity change, appetite change, fatigue and unexpected weight change.  Gastrointestinal: Negative for abdominal pain, diarrhea, constipation and abdominal distention.  Neurological: Negative.       Objective:   Physical Exam  Constitutional: She is oriented to person, place, and time. She appears well-developed and well-nourished.  HENT:  Head: Normocephalic and atraumatic.  Eyes: EOM are normal.  Neck: Normal range of motion.  Cardiovascular: Normal rate and regular rhythm.   Pulmonary/Chest: Effort normal and breath sounds normal.  Abdominal: Soft. Bowel sounds are normal. She exhibits no distension. There is no tenderness. There is no rebound.  Belly button with some skin darkening but no drainage or obvious fistula.   Musculoskeletal: She exhibits no edema.  Neurological: She is alert and oriented to person, place, and time.  Skin: Skin is warm and dry.   Filed Vitals:   04/17/14 0926  BP: 140/90  Pulse: 88  Temp: 98.3 F (36.8 C)  TempSrc: Oral  Resp: 18  Height: 5' 2.5" (1.588 m)  Weight: 157 lb 9.6 oz (71.487 kg)  SpO2: 93%      Assessment & Plan:

## 2014-04-19 NOTE — Assessment & Plan Note (Signed)
Check U/A today for mild abdominal pain to rule out infection given the suprapubic nature of the pain.

## 2014-04-19 NOTE — Assessment & Plan Note (Signed)
Will check CXR for bony prominence of right collarbone.

## 2014-04-19 NOTE — Assessment & Plan Note (Signed)
Belly button without drainage or obvious fistula. She has follow up with GI doctor in about 1-2 weeks. Will forward note to her GI doctor. Does not have cause for CT scan today or antibiotics.

## 2014-04-20 ENCOUNTER — Ambulatory Visit (INDEPENDENT_AMBULATORY_CARE_PROVIDER_SITE_OTHER): Payer: Medicare HMO | Admitting: Family Medicine

## 2014-04-20 ENCOUNTER — Telehealth: Payer: Self-pay | Admitting: Internal Medicine

## 2014-04-20 ENCOUNTER — Ambulatory Visit (INDEPENDENT_AMBULATORY_CARE_PROVIDER_SITE_OTHER)
Admission: RE | Admit: 2014-04-20 | Discharge: 2014-04-20 | Disposition: A | Discharge status: Home / Self Care | Payer: Medicare HMO | Source: Ambulatory Visit | Attending: Family Medicine | Admitting: Family Medicine

## 2014-04-20 ENCOUNTER — Other Ambulatory Visit (INDEPENDENT_AMBULATORY_CARE_PROVIDER_SITE_OTHER): Payer: Medicare HMO

## 2014-04-20 ENCOUNTER — Encounter: Payer: Self-pay | Admitting: Family Medicine

## 2014-04-20 VITALS — BP 130/84 | HR 95 | Ht 62.5 in | Wt 156.0 lb

## 2014-04-20 DIAGNOSIS — J449 Chronic obstructive pulmonary disease, unspecified: Secondary | ICD-10-CM

## 2014-04-20 DIAGNOSIS — R5383 Other fatigue: Secondary | ICD-10-CM

## 2014-04-20 DIAGNOSIS — M25511 Pain in right shoulder: Secondary | ICD-10-CM

## 2014-04-20 LAB — COMPREHENSIVE METABOLIC PANEL
ALT: 11 U/L (ref 0–35)
AST: 15 U/L (ref 0–37)
Albumin: 4.3 g/dL (ref 3.5–5.2)
Alkaline Phosphatase: 77 U/L (ref 39–117)
BUN: 20 mg/dL (ref 6–23)
CO2: 31 mEq/L (ref 19–32)
Calcium: 10.2 mg/dL (ref 8.4–10.5)
Chloride: 101 mEq/L (ref 96–112)
Creatinine, Ser: 1.11 mg/dL (ref 0.40–1.20)
GFR: 52.58 mL/min — ABNORMAL LOW (ref >60.00)
Glucose, Bld: 109 mg/dL — ABNORMAL HIGH (ref 70–99)
Potassium: 4.9 mEq/L (ref 3.5–5.1)
Sodium: 137 mEq/L (ref 135–145)
Total Bilirubin: 0.3 mg/dL (ref 0.2–1.2)
Total Protein: 7.4 g/dL (ref 6.0–8.3)

## 2014-04-20 LAB — CBC WITH DIFFERENTIAL/PLATELET
Basophils Absolute: 0.1 10*3/uL (ref 0.0–0.1)
Basophils Relative: 1.1 % (ref 0.0–3.0)
Eosinophils Absolute: 0.3 10*3/uL (ref 0.0–0.7)
Eosinophils Relative: 2.6 % (ref 0.0–5.0)
HCT: 43.3 % (ref 36.0–46.0)
Hemoglobin: 14.4 g/dL (ref 12.0–15.0)
Lymphocytes Relative: 32.9 % (ref 12.0–46.0)
Lymphs Abs: 4 10*3/uL (ref 0.7–4.0)
MCHC: 33.2 g/dL (ref 30.0–36.0)
MCV: 90.4 fl (ref 78.0–100.0)
Monocytes Absolute: 1 10*3/uL (ref 0.1–1.0)
Monocytes Relative: 8.5 % (ref 3.0–12.0)
Neutro Abs: 6.7 10*3/uL (ref 1.4–7.7)
Neutrophils Relative %: 54.9 % (ref 43.0–77.0)
Platelets: 306 10*3/uL (ref 150.0–400.0)
RBC: 4.79 Mil/uL (ref 3.87–5.11)
RDW: 13.6 % (ref 11.5–15.5)
WBC: 12.3 10*3/uL — ABNORMAL HIGH (ref 4.0–10.5)

## 2014-04-20 LAB — C-REACTIVE PROTEIN: CRP: 0.3 mg/dL — ABNORMAL LOW (ref 0.5–20.0)

## 2014-04-20 LAB — SEDIMENTATION RATE: Sed Rate: 25 mm/hr — ABNORMAL HIGH (ref 0–22)

## 2014-04-20 NOTE — Progress Notes (Signed)
[name] [address] [place], [place] [address] Phone: [phone] Subjective:    CC: Neck pain  ZJI:[id number] Erica [name] is a 64 y.o. female coming in with complaint of neck pain. Patient has an acute onset of neck pain mostly on the right side with significant stiffness the started on 01/13/2014. Patient was in such severe pain she did go to the emergency room. In the emergency room she did have x-rays. These x-rays were reviewed by me and showed some slight disc space narrowing of C5 on C6. No acute fracture noted. Patient has taken muscle relaxers as well as Vicodin without any significant improvement. Patient was given an injection in her last episode. Patient states that this did not make any significant improvement. Patient states that she was hoping that it would continue to get better. Patient states if anything it seems to be getting worse. Patient states this seems to be more localized into the right shoulder at this time. Patient describes it as a dull throbbing pain at all times now. Worse with certain range of motion. Patient puts the severity of pain a 6 out of 10.    Past medical history, social, surgical and family history all reviewed in electronic medical record.   Review of Systems: No headache, visual changes, nausea, vomiting, diarrhea, constipation, dizziness, abdominal pain, skin rash, fevers, chills, night sweats, weight loss, swollen lymph nodes, body aches, joint swelling, muscle aches, chest pain, shortness of breath, mood changes.   Objective Blood pressure 130/84, pulse 95, weight 156 lb (70.761 kg), SpO2 95 %.  General: No apparent distress alert and oriented x3 mood and affect normal, dressed appropriately.  HEENT: Pupils equal, extraocular movements intact  Respiratory: Patient's speak in full sentences and does not appear short of breath  Cardiovascular: No lower extremity edema, non tender, no erythema  Skin: Warm dry  intact with no signs of infection or rash on extremities or on axial skeleton.  Abdomen: Soft nontender  Neuro: Cranial nerves II through XII are intact, neurovascularly intact in all extremities with 2+ DTRs and 2+ pulses.  Lymph: No lymphadenopathy of posterior or anterior cervical chain or axillae bilaterally.  Gait normal with good balance and coordination.  MSK:  Non tender with full range of motion and good stability and symmetric strength and tone of  elbows, wrist, hip, knee and ankles bilaterally.  Neck: Inspection unremarkable. No palpable stepoffs. Negative Spurling's maneuver. Decreased range of motion with right-sided rotation approximately 10 as well as right-sided side bending. Patient does have pulling of the posterior aspect of her neck with flexion but does have full flexion. Grip strength and sensation normal in bilateral hands Strength good C4 to T1 distribution No sensory change to C4 to T1 Negative Hoffman sign bilaterally Reflexes normal Shoulder: Right Inspection reveals no abnormalities, atrophy or asymmetry. Palpation is normal with no tenderness over AC joint or bicipital groove. ROM is full in all planes passively. Rotator cuff strength normal throughout. signs of impingement with positive Neer and Hawkin's tests, but negative empty can sign. Speeds and Yergason's tests normal. No labral pathology noted with negative Obrien's, negative clunk and good stability. Normal scapular function observed. No painful arc and no drop arm sign. No apprehension sign No significant change from previous exam.    X-rays were reviewed by me. Patient did have a chest x-ray on Friday for COPD exacerbation. X-rays showed the patient did have a subtle increased and sitting the right pulmonary apex.  Impression and Recommendations:     This case required medical decision making of moderate complexity.

## 2014-04-20 NOTE — Assessment & Plan Note (Signed)
Patient continues to have a right shoulder pain. I do think there is a possibility that this is a recurrent subacromial bursitis with patient being greater than 4 months out. Patient does not believe that this is a possibility. Patient though does have recent x-ray showing a possible density in the apex of anything further evaluation is necessary. 4 view patient's chest x-ray were done today to evaluate for the density. In addition of this x-ray of the shoulder 2. Depending on the findings I would like to consider in the differential rotator cuff pathology, the patient's history of smoking cancer cannot be ruled out. Cervical radiculopathy is also still a concern. I also feel that even though patient is not given any systemic symptoms I do think that an labs would be beneficial. These were ordered by me today as well. We discussed that I will call her once have these labs and we will discuss if any further imaging such as either a CT of the chest or an MRI of the shoulder is necessary. We will discuss findings with her pulmonologist as well.  Spent  25 minutes with patient face-to-face and had greater than 50% of counseling including as described above in assessment and plan.

## 2014-04-20 NOTE — Patient Instructions (Signed)
We will get xrays for pain.  Continue the pennsaid twice daily.  I love the idea of the diet.  ICe is your friend I will call you when I get the xrays. Then we will discuss which test are next.

## 2014-04-20 NOTE — Telephone Encounter (Signed)
Patient calling in to check on status of xrays from Friday. Advised still being processed and we will call her soon with results.

## 2014-04-21 ENCOUNTER — Telehealth: Payer: Self-pay | Admitting: Internal Medicine

## 2014-04-21 ENCOUNTER — Ambulatory Visit (INDEPENDENT_AMBULATORY_CARE_PROVIDER_SITE_OTHER): Payer: Medicare HMO | Admitting: Internal Medicine

## 2014-04-21 ENCOUNTER — Encounter: Payer: Self-pay | Admitting: Internal Medicine

## 2014-04-21 ENCOUNTER — Other Ambulatory Visit: Payer: Self-pay | Admitting: Internal Medicine

## 2014-04-21 VITALS — BP 130/76 | HR 84 | Ht 62.5 in | Wt 155.0 lb

## 2014-04-21 DIAGNOSIS — R198 Other specified symptoms and signs involving the digestive system and abdomen: Secondary | ICD-10-CM

## 2014-04-21 DIAGNOSIS — K5080 Crohn's disease of both small and large intestine without complications: Secondary | ICD-10-CM

## 2014-04-21 MED ORDER — DOXYCYCLINE HYCLATE 100 MG PO TABS: 100.0000 mg | ORAL_TABLET | Freq: Two times a day (BID) | ORAL | Status: AC

## 2014-04-21 NOTE — Assessment & Plan Note (Signed)
She says she is ok at this time Does not want to take Tx Using gluten-free diet RTC 6 months

## 2014-04-21 NOTE — Telephone Encounter (Signed)
Occupation that repeat x-rays do not show any finding in the upper lobe. Patient though does have an elevated white blood cell count and we'll treat for the possibility of a pneumonia and COPD patient. Patient was given doxycycline for one week. We discussed that if patient does not make improvement with the right shoulder pain I would like to get an MRI of the cervical spine to rule out cervical radiculopathy as well as the shoulder to rule out any other intra-articular pathology.

## 2014-04-21 NOTE — Progress Notes (Signed)
   Subjective:    Patient ID: Erica [name], female    DOB: 12-20-1950, 64 y.o.   MRN: [id number]  HPI Here for f/u. A few months ago had some drainage from navel. She was being seen in [facility] but left before Dr. Linna Darner could see her (after NP student did evaluate her). He called her and advised she go to ED to be evaluated and considered for CT scan abd/pelvis given Crohn's hx and possibility of fistula. Drainage was mucoid it sounds like. It spontaneously stopped. She did f/u with Dr. Doug Sou also. Has also been seeing Dr. [name] re: shoulder sxs. Had ESR 25, CRP 0.3. CBC ok. She is not on Crohn's Tx, says she is ok now and not having diarrhea problems or abdominal pain. She believes gluten-free diet helps.  Medications, allergies, past medical history, past surgical history, family history and social history are reviewed and updated in the EMR.  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

## 2014-04-21 NOTE — Patient Instructions (Signed)
Follow up with Dr. Carlean Purl in 6 months.   I appreciate the opportunity to care for you. Silvano Rusk, M.D., [facility]

## 2014-04-21 NOTE — Telephone Encounter (Signed)
Pt request test result that was done 04/20/14. Please call pt once its in

## 2014-04-22 ENCOUNTER — Encounter: Payer: Managed Care, Other (non HMO) | Admitting: Internal Medicine

## 2014-05-04 ENCOUNTER — Encounter: Payer: Medicare HMO | Admitting: Internal Medicine

## 2014-05-18 ENCOUNTER — Ambulatory Visit: Payer: Medicare HMO | Admitting: Family Medicine

## 2014-05-29 ENCOUNTER — Telehealth: Payer: Self-pay | Admitting: Internal Medicine

## 2014-05-29 MED ORDER — HYDROCODONE-ACETAMINOPHEN 5-325 MG PO TABS: 1.0000 | ORAL_TABLET | Freq: Two times a day (BID) | ORAL | Status: DC | PRN

## 2014-05-29 NOTE — Telephone Encounter (Signed)
Done

## 2014-05-29 NOTE — Telephone Encounter (Signed)
Patient is requesting refill on hydrocodone

## 2014-06-08 ENCOUNTER — Ambulatory Visit (INDEPENDENT_AMBULATORY_CARE_PROVIDER_SITE_OTHER): Payer: Medicare HMO | Admitting: Family Medicine

## 2014-06-08 ENCOUNTER — Other Ambulatory Visit: Payer: Self-pay | Admitting: *Deleted

## 2014-06-08 ENCOUNTER — Encounter: Payer: Self-pay | Admitting: Family Medicine

## 2014-06-08 ENCOUNTER — Other Ambulatory Visit (INDEPENDENT_AMBULATORY_CARE_PROVIDER_SITE_OTHER): Payer: Medicare HMO

## 2014-06-08 VITALS — BP 142/82 | HR 70 | Ht 62.5 in | Wt 157.0 lb

## 2014-06-08 DIAGNOSIS — M25511 Pain in right shoulder: Secondary | ICD-10-CM

## 2014-06-08 DIAGNOSIS — M5412 Radiculopathy, cervical region: Secondary | ICD-10-CM

## 2014-06-08 DIAGNOSIS — M6248 Contracture of muscle, other site: Secondary | ICD-10-CM

## 2014-06-08 DIAGNOSIS — M62838 Other muscle spasm: Secondary | ICD-10-CM

## 2014-06-08 DIAGNOSIS — M5010 Cervical disc disorder with radiculopathy, unspecified cervical region: Secondary | ICD-10-CM

## 2014-06-08 MED ORDER — GABAPENTIN 300 MG PO CAPS: ORAL_CAPSULE | ORAL | Status: DC

## 2014-06-08 NOTE — Progress Notes (Signed)
[name] [place], [place] [address] Phone: [phone] Subjective:    CC: Neck pain  IRS:[id number] Erica [name] is a 64 y.o. female coming in with complaint of neck pain. Patient has an acute onset of neck pain mostly on the right side with significant stiffness the started on 01/13/2014. Patient was in such severe pain she did go to the emergency room. In the emergency room she did have x-rays. These x-rays were reviewed by me and showed some slight disc space narrowing of C5 on C6.  Patient forcing continues to have right shoulder pain. Patient states that this is affecting her daily activities. Difficult to dress herself as well as it's waking her up at night. Patient continues to take the muscle relaxer as well as pain medication 3 times daily. Patient states that this seems to be from the neck as well as the shoulder. Seems to be more on the backside of her thoracic spine. Patient does have a history of smoking. Patient also has a history of COPD.    Past medical history, social, surgical and family history all reviewed in electronic medical record.   Review of Systems: No headache, visual changes, nausea, vomiting, diarrhea, constipation, dizziness, abdominal pain, skin rash, fevers, chills, night sweats, weight loss, swollen lymph nodes, body aches, joint swelling, muscle aches, chest pain, shortness of breath, mood changes.   Objective Blood pressure 142/82, pulse 70, height 5' 2.5" (1.588 m), weight 157 lb (71.215 kg), SpO2 95 %.  General: No apparent distress alert and oriented x3 mood and affect normal, dressed appropriately.  HEENT: Pupils equal, extraocular movements intact  Respiratory: Patient's speak in full sentences and does not appear short of breath  Cardiovascular: No lower extremity edema, non tender, no erythema  Skin: Warm dry intact with no signs of infection or rash on extremities or on axial skeleton.  Abdomen:  Soft nontender  Neuro: Cranial nerves II through XII are intact, neurovascularly intact in all extremities with 2+ DTRs and 2+ pulses.  Lymph: No lymphadenopathy of posterior or anterior cervical chain or axillae bilaterally.  Gait normal with good balance and coordination.  MSK:  Non tender with full range of motion and good stability and symmetric strength and tone of  elbows, wrist, hip, knee and ankles bilaterally.  Neck: Inspection unremarkable. No palpable stepoffs. Positive Spurling's maneuver. Decreased range of motion with right-sided rotation approximately 10 as well as right-sided side bending. Patient does have pulling of the posterior aspect of her neck with flexion but does have full flexion. Grip strength and sensation normal in bilateral hands Strength good C4 to T1 distribution No sensory change to C4 to T1 Negative Hoffman sign bilaterally Reflexes normal Shoulder: Right Inspection reveals no abnormalities, atrophy or asymmetry. Palpation is normal with no tenderness over AC joint or bicipital groove. ROM is full in all planes passively. Rotator cuff strength normal throughout. signs of impingement with positive Neer and Hawkin's tests, but negative empty can sign. Speeds and Yergason's tests normal. No labral pathology noted with negative Obrien's, negative clunk and good stability. Normal scapular function observed. No painful arc and no drop arm sign. No apprehension sign Trigger point noted in right trapezius Worsening symptoms since previous 1.  After verbal consent patient was prepped with alcohol swabs and with a 25-gauge 1 inch needle patient was injected into for different trigger points with a total of 2 mL of 0.5% Marcaine and 1 mL of Kenalog 41 g/dL. Post  injection instructions given.       Impression and Recommendations:     This case required medical decision making of moderate complexity.

## 2014-06-08 NOTE — Assessment & Plan Note (Signed)
I believe the patient is having more radicular symptoms that is secondary to more the cervical spine. Patient has known arthritis of the neck. I do think the patient is having radicular symptoms of the C5 distribution. Positive Spurling's today. This is affecting her daily activities and advance imaging is necessary. There is no weakness locally. Patient was given gabapentin to see if this will be beneficial. We discussed the possibility of prednisone the with patient's history of COPD as well as Crohn's disease she has been on multiple times a day like to avoid we can. Patient will come back after the MRI and we will discuss further management.

## 2014-06-08 NOTE — Assessment & Plan Note (Signed)
Patient will have MRI ordered because patient has not responded to conservative therapy. Patient will have the MRI and come back to discuss further management.

## 2014-06-08 NOTE — Assessment & Plan Note (Signed)
Trigger point injections done today. We discussed home massage manual therapy but could be beneficial as well. I think this is likely coming from the radicular symptoms.

## 2014-06-08 NOTE — Progress Notes (Signed)
Pre visit review using our clinic review tool, if applicable. No additional management support is needed unless otherwise documented below in the visit note. 

## 2014-06-08 NOTE — Patient Instructions (Signed)
Good to see you Ice 20 minutes 2 times daily. Usually after activity and before bed. Continue the exercises Try laying on the tennis ball to help with the muscle spasm. Try the gababpentin at night We will get MRI of shoulder and neck to make sure we are treating the right thing See me again 1-2 days after MRI

## 2014-06-15 ENCOUNTER — Ambulatory Visit (INDEPENDENT_AMBULATORY_CARE_PROVIDER_SITE_OTHER)
Admission: RE | Admit: 2014-06-15 | Discharge: 2014-06-15 | Disposition: A | Discharge status: Home / Self Care | Payer: Medicare HMO | Source: Ambulatory Visit | Attending: Family Medicine | Admitting: Family Medicine

## 2014-06-15 ENCOUNTER — Other Ambulatory Visit: Payer: Self-pay | Admitting: *Deleted

## 2014-06-15 DIAGNOSIS — M25511 Pain in right shoulder: Secondary | ICD-10-CM

## 2014-06-16 ENCOUNTER — Telehealth: Payer: Self-pay | Admitting: *Deleted

## 2014-06-16 ENCOUNTER — Telehealth: Payer: Self-pay | Admitting: Family Medicine

## 2014-06-16 ENCOUNTER — Telehealth: Payer: Self-pay

## 2014-06-16 NOTE — Telephone Encounter (Signed)
Moderate OA but otherwise unremarkable.

## 2014-06-16 NOTE — Telephone Encounter (Signed)
Call attempted to number given, but unable to leave a message.

## 2014-06-16 NOTE — Telephone Encounter (Signed)
Discussed with pt

## 2014-06-16 NOTE — Telephone Encounter (Signed)
Pt called requesting a refill for Hydrocodone. She is leaving next Thursday to go to [place] & she would like to pick it up before she leaves.

## 2014-06-16 NOTE — Telephone Encounter (Signed)
Pt request x ray result that was done last week. Please call pt

## 2014-06-18 MED ORDER — HYDROCODONE-ACETAMINOPHEN 5-325 MG PO TABS: 1.0000 | ORAL_TABLET | Freq: Two times a day (BID) | ORAL | Status: DC | PRN

## 2014-06-18 NOTE — Telephone Encounter (Signed)
Please call and let know ok to pick up.

## 2014-06-18 NOTE — Telephone Encounter (Signed)
Left message informing patient she has a prescription ready for pick up.

## 2014-07-06 ENCOUNTER — Telehealth: Payer: Self-pay | Admitting: Family

## 2014-07-06 ENCOUNTER — Telehealth: Payer: Self-pay | Admitting: Internal Medicine

## 2014-07-06 ENCOUNTER — Encounter: Payer: Self-pay | Admitting: Family

## 2014-07-06 ENCOUNTER — Ambulatory Visit (INDEPENDENT_AMBULATORY_CARE_PROVIDER_SITE_OTHER)
Admission: RE | Admit: 2014-07-06 | Discharge: 2014-07-06 | Disposition: A | Discharge status: Home / Self Care | Payer: Medicare HMO | Source: Ambulatory Visit | Attending: Family | Admitting: Family

## 2014-07-06 ENCOUNTER — Ambulatory Visit (INDEPENDENT_AMBULATORY_CARE_PROVIDER_SITE_OTHER): Payer: Medicare HMO | Admitting: Family

## 2014-07-06 DIAGNOSIS — R059 Cough, unspecified: Secondary | ICD-10-CM

## 2014-07-06 DIAGNOSIS — R05 Cough: Secondary | ICD-10-CM

## 2014-07-06 MED ORDER — LEVOFLOXACIN 500 MG PO TABS: 500.0000 mg | ORAL_TABLET | Freq: Every day | ORAL | Status: DC

## 2014-07-06 MED ORDER — METHYLPREDNISOLONE ACETATE 80 MG/ML IJ SUSP
80.0000 mg | Freq: Once | INTRAMUSCULAR | Status: AC
  Administered 2014-07-06: 80 mg via INTRAMUSCULAR

## 2014-07-06 MED ORDER — ALBUTEROL SULFATE (2.5 MG/3ML) 0.083% IN NEBU
2.5000 mg | INHALATION_SOLUTION | Freq: Once | RESPIRATORY_TRACT | Status: AC
  Administered 2014-07-06: 2.5 mg via RESPIRATORY_TRACT

## 2014-07-06 MED ORDER — PREDNISONE 20 MG PO TABS: 20.0000 mg | ORAL_TABLET | Freq: Two times a day (BID) | ORAL | Status: DC

## 2014-07-06 NOTE — Progress Notes (Signed)
Subjective:    Patient ID: Erica [name], female    DOB: 09/15/1950, 64 y.o.   MRN: [id number]  Chief Complaint  Patient presents with  . Cough    x6 days, productive cough, really bad chest congestion, wheezing, SOB    HPI:  Erica [name] is a 64 y.o. female who presents today for an acute visit.   This is a new problem. Associated symptom of productive cough, chest congestion, wheezing and shortness of breath has been going on for about 6 days. Indicates that her oxygen intake appears to be down with exertion.  Has tried taking Tylenol, Mucinex, and Benedryl which have provided minimal relief. Notes that she is using her inhaler and it is not helping very much.                                                                  Allergies  Allergen Reactions  . Morphine Nausea And Vomiting    Current Outpatient Prescriptions on File Prior to Visit  Medication Sig Dispense Refill  . albuterol (PROVENTIL HFA;VENTOLIN HFA) 108 (90 BASE) MCG/ACT inhaler Inhale 2 puffs into the lungs daily as needed for wheezing or shortness of breath (shortness of breath).    . cyclobenzaprine (FLEXERIL) 10 MG tablet Take 10 mg by mouth 3 (three) times daily as needed for muscle spasms (muscle pain).    [name] HYDROcodone-acetaminophen (NORCO/VICODIN) 5-325 MG per tablet Take 1 tablet by mouth 2 (two) times daily as needed for moderate pain (pain). 60 tablet 0  . metoprolol tartrate (LOPRESSOR) 25 MG tablet Take 25 mg by mouth 2 (two) times daily.    . nicotine (NICODERM CQ - DOSED IN MG/24 HOURS) 14 mg/24hr patch Place 1 patch (14 mg total) onto the skin daily. 28 patch 0   No current facility-administered medications on file prior to visit.    Past Medical History  Diagnosis Date  . Takotsubo syndrome 12/2008  . ANEMIA   . OSTEOARTHRITIS   . URINARY INCONTINENCE   . ADENOCARCINOMA, COLON, HX OF 03/2009  . BACK PAIN, LUMBAR   . HYPERTENSION   . HYPERLIPIDEMIA   . GERD   . Crohn's  02/2010   ileal ulcers, intol of entercort--refuses treatment  . Microscopic hematuria     chronic  . CHF (congestive heart failure)   . History of transfusion of whole blood   . Obstruction of intestine or colon     adhesions  . COPD (chronic obstructive pulmonary disease) with emphysema   . Rotator cuff tear   . Rectal fissure     Possible fissure    Review of Systems  Constitutional: Positive for chills. Negative for fever.  HENT: Positive for congestion. Negative for sinus pressure and sore throat.   Respiratory: Positive for cough, chest tightness, shortness of breath and wheezing.   Neurological: Negative for headaches.      Objective:    BP 150/92 mmHg  Pulse 94  Temp(Src) 98.2 F (36.8 C) (Oral)  Resp 22  Ht 5' 2.5" (1.588 m)  Wt 154 lb 12.8 oz (70.217 kg)  BMI 27.84 kg/m2  SpO2 92% Nursing note and vital signs reviewed.  Physical Exam  Constitutional: She is oriented to person, place, and time. She appears well-developed and well-nourished.  No distress.  HENT:  Right Ear: Hearing, tympanic membrane, external ear and ear canal normal.  Left Ear: Hearing, tympanic membrane, external ear and ear canal normal.  Nose: Nose normal. Right sinus exhibits no maxillary sinus tenderness and no frontal sinus tenderness. Left sinus exhibits no maxillary sinus tenderness and no frontal sinus tenderness.  Mouth/Throat: Uvula is midline, oropharynx is clear and moist and mucous membranes are normal.  Cardiovascular: Normal rate, regular rhythm, normal heart sounds and intact distal pulses.   Pulmonary/Chest: Effort normal. She has wheezes.  Neurological: She is alert and oriented to person, place, and time.  Skin: Skin is warm and dry.  Psychiatric: She has a normal mood and affect. Her behavior is normal. Judgment and thought content normal.       Assessment & Plan:

## 2014-07-06 NOTE — Telephone Encounter (Signed)
Pt aware of results 

## 2014-07-06 NOTE — Assessment & Plan Note (Signed)
Symptoms and exam consistent with COPD exacerbation, however cannot rule out acute bronchitis. Obtain chest x-ray to rule out PNA. In office injections of Depo-Medrol given. In office nebulized albuterol given. Start Levaquin. Start prednisone. Follow-up if symptoms worsen or fail to improve.

## 2014-07-06 NOTE — Patient Instructions (Signed)
Thank you for choosing [facility].  Summary/Instructions:  Your prescription(s) have been submitted to your pharmacy or been printed and provided for you. Please take as directed and contact our office if you believe you are having problem(s) with the medication(s) or have any questions.  Please stop by radiology on the basement level of the building for your x-rays. Your results will be released to [facility] (or called to you) after review, usually within 72 hours after test completion. If any treatments or changes are necessary, you will be notified at that same time.  If your symptoms worsen or fail to improve, please contact our office for further instruction, or in case of emergency go directly to the emergency room at the closest medical facility.

## 2014-07-06 NOTE — Telephone Encounter (Signed)
Please call and inform the patient that she has pneumonia and the medication that was prescribed will be able to treat it. If her symptoms worsen she may need to go to the Emergency Room.

## 2014-07-06 NOTE — Progress Notes (Signed)
Pre visit review using our clinic review tool, if applicable. No additional management support is needed unless otherwise documented below in the visit note. 

## 2014-07-06 NOTE — Telephone Encounter (Signed)
Would like to know if she can take pain meds being diagnosed with pneumonia.

## 2014-07-07 NOTE — Telephone Encounter (Signed)
Pt is aware that she can take pain meds while she has pneumonia per Marya Amsler.

## 2014-07-13 ENCOUNTER — Emergency Department (HOSPITAL_COMMUNITY)
Admission: EM | Admit: 2014-07-13 | Discharge: 2014-07-13 | Disposition: A | Discharge status: Home / Self Care | Payer: Medicare HMO | Attending: Emergency Medicine | Admitting: Emergency Medicine

## 2014-07-13 ENCOUNTER — Emergency Department (HOSPITAL_COMMUNITY): Payer: Medicare HMO

## 2014-07-13 ENCOUNTER — Encounter (HOSPITAL_COMMUNITY): Payer: Self-pay | Admitting: *Deleted

## 2014-07-13 DIAGNOSIS — Z792 Long term (current) use of antibiotics: Secondary | ICD-10-CM | POA: Insufficient documentation

## 2014-07-13 DIAGNOSIS — Z862 Personal history of diseases of the blood and blood-forming organs and certain disorders involving the immune mechanism: Secondary | ICD-10-CM | POA: Diagnosis not present

## 2014-07-13 DIAGNOSIS — Z87828 Personal history of other (healed) physical injury and trauma: Secondary | ICD-10-CM | POA: Diagnosis not present

## 2014-07-13 DIAGNOSIS — J449 Chronic obstructive pulmonary disease, unspecified: Secondary | ICD-10-CM

## 2014-07-13 DIAGNOSIS — I10 Essential (primary) hypertension: Secondary | ICD-10-CM | POA: Diagnosis not present

## 2014-07-13 DIAGNOSIS — Z79899 Other long term (current) drug therapy: Secondary | ICD-10-CM | POA: Insufficient documentation

## 2014-07-13 DIAGNOSIS — I509 Heart failure, unspecified: Secondary | ICD-10-CM | POA: Diagnosis not present

## 2014-07-13 DIAGNOSIS — Z7952 Long term (current) use of systemic steroids: Secondary | ICD-10-CM | POA: Insufficient documentation

## 2014-07-13 DIAGNOSIS — Z8719 Personal history of other diseases of the digestive system: Secondary | ICD-10-CM | POA: Diagnosis not present

## 2014-07-13 DIAGNOSIS — Z720 Tobacco use: Secondary | ICD-10-CM | POA: Insufficient documentation

## 2014-07-13 DIAGNOSIS — J441 Chronic obstructive pulmonary disease with (acute) exacerbation: Secondary | ICD-10-CM | POA: Diagnosis not present

## 2014-07-13 DIAGNOSIS — Z85038 Personal history of other malignant neoplasm of large intestine: Secondary | ICD-10-CM | POA: Insufficient documentation

## 2014-07-13 DIAGNOSIS — Z8639 Personal history of other endocrine, nutritional and metabolic disease: Secondary | ICD-10-CM | POA: Insufficient documentation

## 2014-07-13 DIAGNOSIS — R0602 Shortness of breath: Secondary | ICD-10-CM | POA: Diagnosis present

## 2014-07-13 DIAGNOSIS — M1990 Unspecified osteoarthritis, unspecified site: Secondary | ICD-10-CM | POA: Insufficient documentation

## 2014-07-13 DIAGNOSIS — Z8701 Personal history of pneumonia (recurrent): Secondary | ICD-10-CM | POA: Insufficient documentation

## 2014-07-13 LAB — BASIC METABOLIC PANEL
Anion gap: 6 (ref 5–15)
BUN: 20 mg/dL (ref 6–23)
CO2: 27 mmol/L (ref 19–32)
Calcium: 8.9 mg/dL (ref 8.4–10.5)
Chloride: 107 mmol/L (ref 96–112)
Creatinine, Ser: 0.82 mg/dL (ref 0.50–1.10)
GFR calc Af Amer: 86 mL/min — ABNORMAL LOW (ref >90)
GFR calc non Af Amer: 74 mL/min — ABNORMAL LOW (ref >90)
Glucose, Bld: 102 mg/dL — ABNORMAL HIGH (ref 70–99)
Potassium: 4.2 mmol/L (ref 3.5–5.1)
Sodium: 140 mmol/L (ref 135–145)

## 2014-07-13 LAB — CBC
HCT: 41.4 % (ref 36.0–46.0)
Hemoglobin: 13.6 g/dL (ref 12.0–15.0)
MCH: 30.6 pg (ref 26.0–34.0)
MCHC: 32.9 g/dL (ref 30.0–36.0)
MCV: 93.2 fL (ref 78.0–100.0)
Platelets: 399 10*3/uL (ref 150–400)
RBC: 4.44 MIL/uL (ref 3.87–5.11)
RDW: 13.3 % (ref 11.5–15.5)
WBC: 16.6 10*3/uL — ABNORMAL HIGH (ref 4.0–10.5)

## 2014-07-13 MED ORDER — HYDROCOD POLST-CHLORPHEN POLST 10-8 MG/5ML PO LQCR: 5.0000 mL | Freq: Two times a day (BID) | ORAL | Status: DC | PRN

## 2014-07-13 MED ORDER — PREDNISONE 20 MG PO TABS: ORAL_TABLET | ORAL | Status: DC

## 2014-07-13 NOTE — ED Notes (Addendum)
Pt reports she was recently treated for COPD exacerbation and PNA. Finished levaquin and prednisone yesterday. Reports cough is not better and SOB. Back pain 6/10. Cigarette smoker x45 years.

## 2014-07-13 NOTE — Discharge Instructions (Signed)

## 2014-07-13 NOTE — ED Provider Notes (Signed)
CSN: [id number]     Arrival date & time 07/13/14  1830 History   First MD Initiated Contact with Patient 07/13/14 2027     Chief Complaint  Patient presents with  . Shortness of Breath  . Cough     (Consider location/radiation/quality/duration/timing/severity/associated sxs/prior Treatment) HPI Comments: Patient presents to the ER for evaluation of cough and shortness of breath. Patient reports that she was diagnosed with pneumonia over a week ago by her primary care doctor. She did have an x-ray that showed pneumonia. She completed a seven-day course of Levaquin. She also was on prednisone and has been using her inhalers. She feels like she has had increased cough and shortness of breath in the last couple of days, was told to come to the ER for further evaluation.  Patient is a 64 y.o. female presenting with shortness of breath and cough.  Shortness of Breath Associated symptoms: cough   Cough Associated symptoms: shortness of breath     Past Medical History  Diagnosis Date  . Takotsubo syndrome 12/2008  . ANEMIA   . OSTEOARTHRITIS   . URINARY INCONTINENCE   . ADENOCARCINOMA, COLON, HX OF 03/2009  . BACK PAIN, LUMBAR   . HYPERTENSION   . HYPERLIPIDEMIA   . GERD   . Crohn's  02/2010    ileal ulcers, intol of entercort--refuses treatment  . Microscopic hematuria     chronic  . CHF (congestive heart failure)   . History of transfusion of whole blood   . Obstruction of intestine or colon     adhesions  . COPD (chronic obstructive pulmonary disease) with emphysema   . Rotator cuff tear   . Rectal fissure     Possible fissure   Past Surgical History  Procedure Laterality Date  . Cesarean section      x 3  . Abdominal hysterectomy  1994  . Colon surgery  03/2009    hemicolectomy colon cancer  . Cholecystectomy  1995  . Tonsillectomy  1952  . Appendectomy  1964  . Hand surgery  1991  . Knee surgery  1981    bilateral  . Right knee replacement  03/2010  . Upper  gastrointestinal endoscopy  05/16/2010    normal  . Colonoscopy w/ biopsies and polypectomy  01/24/2011    (Crohn's)ileitis, internal hemorrhoids  . Breast surgery     Family History  Problem Relation Age of Onset  . Throat cancer Mother   . Colon cancer Father   . Hypertension Father   . Heart disease Father   . Kidney disease Father   . Colon cancer Sister   . Arthritis Other     Parent, other relative   History  Substance Use Topics  . Smoking status: Current Every Day Smoker -- 0.50 packs/day for 48 years    Types: Cigarettes  . Smokeless tobacco: Never Used     Comment: Patient cut down to a pack instead of 2 //Married, lives with spouse. Originally from [place]. 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 Alcohol Use: No   OB History    No data available     Review of Systems  Respiratory: Positive for cough and shortness of breath.   All other systems reviewed and are negative.     Allergies  Morphine  Home Medications   Prior to Admission medications   Medication Sig Start Date End Date Taking? Authorizing Provider  Acetaminophen-DM (COLD &  COUGH DAYTIME PO) Take 30 mLs by mouth every 6 (six) hours as needed (cold symptoms).   Yes Historical Provider, MD  albuterol (PROVENTIL HFA;VENTOLIN HFA) 108 (90 BASE) MCG/ACT inhaler Inhale 2 puffs into the lungs daily as needed for wheezing or shortness of breath (shortness of breath).   Yes Historical Provider, MD  HYDROcodone-acetaminophen (NORCO/VICODIN) 5-325 MG per tablet Take 1 tablet by mouth 2 (two) times daily as needed for moderate pain (pain). 06/18/14  Yes Olga Millers, MD  metoprolol tartrate (LOPRESSOR) 25 MG tablet Take 25 mg by mouth 2 (two) times daily.   Yes Historical Provider, MD  levofloxacin (LEVAQUIN) 500 MG tablet Take 1 tablet (500 mg total) by mouth daily. Patient not taking: Reported on 07/13/2014 07/06/14   Golden Circle, FNP  nicotine  (NICODERM CQ - DOSED IN MG/24 HOURS) 14 mg/24hr patch Place 1 patch (14 mg total) onto the skin daily. Patient not taking: Reported on 07/13/2014 10/24/13   Rowe Clack, MD  predniSONE (DELTASONE) 20 MG tablet Take 1 tablet (20 mg total) by mouth 2 (two) times daily with a meal. Patient not taking: Reported on 07/13/2014 07/06/14   Golden Circle, FNP   BP 129/70 mmHg  Pulse 65  Temp(Src) 98.5 F (36.9 C) (Oral)  Resp 18  SpO2 95% Physical Exam  Constitutional: She is oriented to person, place, and time. She appears well-developed and well-nourished. No distress.  HENT:  Head: Normocephalic and atraumatic.  Right Ear: Hearing normal.  Left Ear: Hearing normal.  Nose: Nose normal.  Mouth/Throat: Oropharynx is clear and moist and mucous membranes are normal.  Eyes: Conjunctivae and EOM are normal. Pupils are equal, round, and reactive to light.  Neck: Normal range of motion. Neck supple.  Cardiovascular: Regular rhythm, S1 normal and S2 normal.  Exam reveals no gallop and no friction rub.   No murmur heard. Pulmonary/Chest: Effort normal. No respiratory distress. She has wheezes. She exhibits no tenderness.  Abdominal: Soft. Normal appearance and bowel sounds are normal. There is no hepatosplenomegaly. There is no tenderness. There is no rebound, no guarding, no tenderness at McBurney's point and negative Murphy's sign. No hernia.  Musculoskeletal: Normal range of motion.  Neurological: She is alert and oriented to person, place, and time. She has normal strength. No cranial nerve deficit or sensory deficit. Coordination normal. GCS eye subscore is 4. GCS verbal subscore is 5. GCS motor subscore is 6.  Skin: Skin is warm, dry and intact. No rash noted. No cyanosis.  Psychiatric: She has a normal mood and affect. Her speech is normal and behavior is normal. Thought content normal.  Nursing note and vitals reviewed.   ED Course  Procedures (including critical care time) Labs  Review Labs Reviewed  BASIC METABOLIC PANEL - Abnormal; Notable for the following:    Glucose, Bld 102 (*)    GFR calc non Af Amer 74 (*)    GFR calc Af Amer 86 (*)    All other components within normal limits  CBC - Abnormal; Notable for the following:    WBC 16.6 (*)    All other components within normal limits    Imaging Review Dg Chest 2 View (if Patient Has Fever And/or Copd)  07/13/2014   CLINICAL DATA:  Cough, congestion, shortness of breath and fever  EXAM: CHEST  2 VIEW  COMPARISON:  07/06/2014  FINDINGS: Right middle lobe consolidation is slightly improved since previously but remains visible. Curvilinear left lower lobe scarring or atelectasis  is noted. Heart size upper limits of normal. No pleural effusion. No acute osseous finding.  IMPRESSION: Improvement in presumed right middle lobe pneumonia. Followup to radiographic resolution is recommended in 2-4 weeks.   Electronically Signed   By: Conchita Paris M.D.   On: 07/13/2014 20:36     EKG Interpretation   Date/Time:  Monday July 13 2014 19:07:20 EDT Ventricular Rate:  76 PR Interval:  154 QRS Duration: 81 QT Interval:  367 QTC Calculation: 413 R Axis:   -18 Text Interpretation:  Sinus or ectopic atrial rhythm Borderline left axis  deviation Low voltage, precordial leads Abnormal R-wave progression, early  transition Baseline wander in lead(s) V4 V5 Since last tracing rate slower  Confirmed by Winfred Leeds  MD, SAM [phone]) on 07/13/2014 7:17:04 PM      MDM   Final diagnoses:  None   COPD  Patient presents to the ER for evaluation of cough and chest congestion. Patient reports that she was recently diagnosed with pneumonia and completed a course of Levaquin. She was also treated with prednisone which she finished yesterday. She is reporting that her cough was not improved, still feels short of breath and has pain in her ribs when she coughs. Chest x-ray today, however, does not show persistent pneumonia, but rather  the pneumonia has cleared. She is not hypoxic. Vital signs are stable. She is therefore a candidate for continued outpatient treatment. She will be treated with repeat prednisone, tapering dosing. No further antibiotics necessary. Continue albuterol every 2-4 hours.    Orpah Greek, MD 07/13/14 2258

## 2014-07-27 ENCOUNTER — Telehealth: Payer: Self-pay | Admitting: Family Medicine

## 2014-07-27 NOTE — Telephone Encounter (Signed)
Patient wanted to let you know that her MRI is scheduled for Sunday May 1st.

## 2014-07-27 NOTE — Telephone Encounter (Signed)
Noted  

## 2014-07-30 ENCOUNTER — Telehealth: Payer: Self-pay | Admitting: Internal Medicine

## 2014-07-30 MED ORDER — HYDROCODONE-ACETAMINOPHEN 5-325 MG PO TABS: 1.0000 | ORAL_TABLET | Freq: Two times a day (BID) | ORAL | Status: DC | PRN

## 2014-07-30 NOTE — Telephone Encounter (Signed)
Patient needs refill for HYDROcodone-acetaminophen (NORCO/VICODIN) 5-325 MG per tablet [[id number]] .

## 2014-07-30 NOTE — Telephone Encounter (Signed)
Patient will come pick up her prescription tomorrow.

## 2014-08-02 ENCOUNTER — Ambulatory Visit
Admission: RE | Admit: 2014-08-02 | Discharge: 2014-08-02 | Disposition: A | Discharge status: Home / Self Care | Payer: Medicare HMO | Source: Ambulatory Visit | Attending: Family Medicine | Admitting: Family Medicine

## 2014-08-02 DIAGNOSIS — M5412 Radiculopathy, cervical region: Secondary | ICD-10-CM

## 2014-08-02 DIAGNOSIS — M25511 Pain in right shoulder: Secondary | ICD-10-CM

## 2014-08-06 ENCOUNTER — Ambulatory Visit (INDEPENDENT_AMBULATORY_CARE_PROVIDER_SITE_OTHER): Payer: Medicare HMO | Admitting: Family Medicine

## 2014-08-06 ENCOUNTER — Encounter: Payer: Self-pay | Admitting: Family Medicine

## 2014-08-06 VITALS — BP 122/82 | HR 73 | Ht 62.5 in | Wt 154.0 lb

## 2014-08-06 DIAGNOSIS — M5010 Cervical disc disorder with radiculopathy, unspecified cervical region: Secondary | ICD-10-CM

## 2014-08-06 NOTE — Assessment & Plan Note (Signed)
Believe the patient's pain is secondary to more radiculopathy secondary to her C5-C6 nerve root impingement. Most of this is facet arthropathy but I do think patient did respond well to an epidural steroid injection and this is ordered today. We discussed that if patient doesn't make significant improvement we will continue to focus on the neck itself. We discussed different things such as neuromodulator. Patient has been on gabapentin previously and states that it has not helped previously. Patient though couldn't possibly use Lyrica or Effexor and we will discuss at follow-up when patient comes back 2 weeks after the injection.  We spent time going over all of the images of the MRI.  Spent  25 minutes with patient face-to-face and had greater than 50% of counseling including as described above in assessment and plan.

## 2014-08-06 NOTE — Patient Instructions (Signed)
Good to see you Ice is your friend and heat before activity We will get an epidural  See me again 2 weeks after the injection.

## 2014-08-06 NOTE — Progress Notes (Signed)
  [name] [address], [place] [address] Phone: [phone] Subjective:    CC: Neck pain  [id number] Erica [name] is a 64 y.o. female coming in with complaint of neck pain. Patient has an acute onset of neck pain mostly on the right side with significant stiffness the started on 01/13/2014. Patient was in such severe pain she did go to the emergency room. In the emergency room she did have x-rays. These x-rays were reviewed by me and showed some slight disc space narrowing of C5 on C6. She continued have pain of the neck as well as the posterior aspect of the shoulder. Patient did have an MRI. Patient is here to go over the results. Patient's MRI does show facet arthropathy at C5-C6 which could be giving right-sided neck pain and referred pain.    Past medical history, social, surgical and family history all reviewed in electronic medical record.   Review of Systems: No headache, visual changes, nausea, vomiting, diarrhea, constipation, dizziness, abdominal pain, skin rash, fevers, chills, night sweats, weight loss, swollen lymph nodes, body aches, joint swelling, muscle aches, chest pain, shortness of breath, mood changes.   Objective Blood pressure 122/82, pulse 73, height 5' 2.5" (1.588 m), weight 154 lb (69.854 kg), SpO2 97 %.  General: No apparent distress alert and oriented x3 mood and affect normal, dressed appropriately.  HEENT: Pupils equal, extraocular movements intact  Respiratory: Patient's speak in full sentences and does not appear short of breath  Cardiovascular: No lower extremity edema, non tender, no erythema  Skin: Warm dry intact with no signs of infection or rash on extremities or on axial skeleton.  Abdomen: Soft nontender  Neuro: Cranial nerves II through XII are intact, neurovascularly intact in all extremities with 2+ DTRs and 2+ pulses.  Lymph: No lymphadenopathy of posterior or anterior cervical chain or axillae  bilaterally.  Gait normal with good balance and coordination.  MSK:  Non tender with full range of motion and good stability and symmetric strength and tone of  elbows, wrist, hip, knee and ankles bilaterally.  Neck: Inspection unremarkable. No palpable stepoffs. Positive Spurling's maneuver. Decreased range of motion with right-sided rotation approximately 10 as well as right-sided side bending. Patient does have pulling of the posterior aspect of her neck with flexion but does have full flexion. Grip strength and sensation normal in bilateral hands Strength good C4 to T1 distribution No sensory change to C4 to T1 Negative Hoffman sign bilaterally Reflexes normal Shoulder: Right Inspection reveals no abnormalities, atrophy or asymmetry. Palpation is normal with no tenderness over AC joint or bicipital groove. ROM is full in all planes passively. Rotator cuff strength normal throughout. signs of impingement with positive Neer and Hawkin's tests, but negative empty can sign. Speeds and Yergason's tests normal. No labral pathology noted with negative Obrien's, negative clunk and good stability. Normal scapular function observed. No painful arc and no drop arm sign. No apprehension sign No change and potentially worsening symptoms     Impression and Recommendations:     This case required medical decision making of moderate complexity.

## 2014-08-06 NOTE — Progress Notes (Signed)
Pre visit review using our clinic review tool, if applicable. No additional management support is needed unless otherwise documented below in the visit note. 

## 2014-08-14 ENCOUNTER — Ambulatory Visit
Admission: RE | Admit: 2014-08-14 | Discharge: 2014-08-14 | Disposition: A | Discharge status: Home / Self Care | Payer: Medicare HMO | Source: Ambulatory Visit | Attending: Family Medicine | Admitting: Family Medicine

## 2014-08-14 VITALS — BP 123/73 | HR 67

## 2014-08-14 DIAGNOSIS — M62838 Other muscle spasm: Secondary | ICD-10-CM

## 2014-08-14 DIAGNOSIS — M542 Cervicalgia: Secondary | ICD-10-CM

## 2014-08-14 DIAGNOSIS — M25511 Pain in right shoulder: Secondary | ICD-10-CM

## 2014-08-14 DIAGNOSIS — M5010 Cervical disc disorder with radiculopathy, unspecified cervical region: Secondary | ICD-10-CM

## 2014-08-14 MED ORDER — DIAZEPAM 5 MG PO TABS
5.0000 mg | ORAL_TABLET | Freq: Once | ORAL | Status: AC
  Administered 2014-08-14: 5 mg via ORAL

## 2014-08-14 MED ORDER — IOHEXOL 300 MG/ML  SOLN
1.0000 mL | Freq: Once | INTRAMUSCULAR | Status: AC | PRN
  Administered 2014-08-14: 1 mL via EPIDURAL

## 2014-08-14 MED ORDER — TRIAMCINOLONE ACETONIDE 40 MG/ML IJ SUSP (RADIOLOGY)
60.0000 mg | Freq: Once | INTRAMUSCULAR | Status: AC
  Administered 2014-08-14: 60 mg via EPIDURAL

## 2014-08-14 NOTE — Discharge Instructions (Signed)

## 2014-08-26 ENCOUNTER — Telehealth: Payer: Self-pay | Admitting: Internal Medicine

## 2014-08-26 NOTE — Telephone Encounter (Signed)
Patient is requesting refill on hydrocodone

## 2014-08-27 MED ORDER — HYDROCODONE-ACETAMINOPHEN 5-325 MG PO TABS: 1.0000 | ORAL_TABLET | Freq: Two times a day (BID) | ORAL | Status: DC | PRN

## 2014-08-27 NOTE — Telephone Encounter (Signed)
Printed and signed.  

## 2014-09-17 ENCOUNTER — Encounter: Payer: Self-pay | Admitting: Internal Medicine

## 2014-09-17 ENCOUNTER — Ambulatory Visit (INDEPENDENT_AMBULATORY_CARE_PROVIDER_SITE_OTHER)
Admission: RE | Admit: 2014-09-17 | Discharge: 2014-09-17 | Disposition: A | Discharge status: Home / Self Care | Payer: Medicare HMO | Source: Ambulatory Visit | Attending: Internal Medicine | Admitting: Internal Medicine

## 2014-09-17 ENCOUNTER — Ambulatory Visit (INDEPENDENT_AMBULATORY_CARE_PROVIDER_SITE_OTHER): Payer: Medicare HMO | Admitting: Internal Medicine

## 2014-09-17 VITALS — BP 116/78 | HR 84 | Temp 98.2°F | Resp 12 | Ht 62.5 in | Wt 151.0 lb

## 2014-09-17 DIAGNOSIS — M5010 Cervical disc disorder with radiculopathy, unspecified cervical region: Secondary | ICD-10-CM

## 2014-09-17 DIAGNOSIS — Z1231 Encounter for screening mammogram for malignant neoplasm of breast: Secondary | ICD-10-CM

## 2014-09-17 DIAGNOSIS — Z0000 Encounter for general adult medical examination without abnormal findings: Secondary | ICD-10-CM

## 2014-09-17 DIAGNOSIS — Z23 Encounter for immunization: Secondary | ICD-10-CM

## 2014-09-17 DIAGNOSIS — J189 Pneumonia, unspecified organism: Secondary | ICD-10-CM | POA: Diagnosis not present

## 2014-09-17 NOTE — Progress Notes (Signed)
Pre visit review using our clinic review tool, if applicable. No additional management support is needed unless otherwise documented below in the visit note. 

## 2014-09-17 NOTE — Progress Notes (Signed)
Subjective:   Erica [name] is a 64 y.o. female who presents for Medicare Annual (Subsequent) preventive examination.  Review of Systems:  HRA assessment completed during visit;  Patient is here for Annual Wellness Assessment prior to doctor apt.  c/o today is shoulder pain now in the right shoulder; MRI completed;  She has had injections to neck at [facility]; but continues to have pain in neck and can't turn her head to the right;  In the Interim developed pneumonia which is resolving but she is here to see Dr. Doug Sou and also would like to discuss her neck issues;   Problem list review for risk OA; hx colon cancer; hyperlipidemia; CHF; COPD; right TKR Current tx underway for Cervical disc radiculopathy; still in pain; ROM very poor and having bad h/a  States she had MI due to stress;  Given information on Metabolic syndrome and risk due to BMI; cholesterol; chol; stress; and BP Educated to check bp Labs 09/30/2012 Lipids chol 225; trig 238; HDL 54; LDL 138 and ratio 4 BMI: 27.6 Diet; Did not review this visit; but will give information on heart healthy diet;  Exercise; very busy; rock climber; now busy with 2 children she has raised; active in school    Psychosocial support; safe community; smoke detectors;  Falls no /  Denies depression Caregiver to other? Spouse is a disabled vet; and the 2 grand-children she raised 35 and 9   Discussed Goal to improve health based on risk Discussed checking BP periodically; heart healthy diet  Screenings overdue Ophthalmology exam; will schedule this year Immunizations; Tdap due  Shingles; 11/2010 Colonoscopy; 09/17/2012 EKG 07/14/2014 Dexa scan; due at 63 Mammogram: goes to [facility] looks like last one was 2012;  Hearing: 4000 hz regularly Dental: Just had bridge worked on   [employer] on safety to take home;   Current Care Team reviewed and updated in snapshot       Objective:     Vitals: BP 142/80 mmHg   Pulse 84  Temp(Src) 98.2 F (36.8 C)  Ht 5' 2.5" (1.588 m)  Wt 151 lb (68.493 kg)  BMI 27.16 kg/m2  SpO2 94%  Tobacco History  Smoking status  . Current Every Day Smoker -- 0.50 packs/day for 48 years  . Types: Cigarettes  Smokeless tobacco  . Never Used    Comment: Patient cut down to a pack instead of 2 //Married, lives with spouse. Originally from [place]. 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 Takotsubo syndrome 12/2008  . ANEMIA   . OSTEOARTHRITIS   . URINARY INCONTINENCE   . ADENOCARCINOMA, COLON, HX OF 03/2009  . BACK PAIN, LUMBAR   . HYPERTENSION   . HYPERLIPIDEMIA   . GERD   . Crohn's  02/2010    ileal ulcers, intol of entercort--refuses treatment  . Microscopic hematuria     chronic  . CHF (congestive heart failure)   . History of transfusion of whole blood   . Obstruction of intestine or colon     adhesions  . COPD (chronic obstructive pulmonary disease) with emphysema   . Rotator cuff tear   . Rectal fissure     Possible fissure   Past Surgical History  Procedure Laterality Date  . Cesarean section      x 3  .  Abdominal hysterectomy  1994  . Colon surgery  03/2009    hemicolectomy colon cancer  . Cholecystectomy  1995  . Tonsillectomy  1952  . Appendectomy  1964  . Hand surgery  1991  . Knee surgery  1981    bilateral  . Right knee replacement  03/2010  . Upper gastrointestinal endoscopy  05/16/2010    normal  . Colonoscopy w/ biopsies and polypectomy  01/24/2011    (Crohn's)ileitis, internal hemorrhoids  . Breast surgery     Family History  Problem Relation Age of Onset  . Throat cancer Mother   . Colon cancer Father   . Hypertension Father   . Heart disease Father   . Kidney disease Father   . Colon cancer Sister   . Arthritis Other     Parent, other relative   History    Sexual Activity  . Sexual Activity: Not on file    Outpatient Encounter Prescriptions as of 09/17/2014  Medication Sig  . albuterol (PROVENTIL HFA;VENTOLIN HFA) 108 (90 BASE) MCG/ACT inhaler Inhale 2 puffs into the lungs daily as needed for wheezing or shortness of breath (shortness of breath).  [name] HYDROcodone-acetaminophen (NORCO/VICODIN) 5-325 MG per tablet Take 1 tablet by mouth 2 (two) times daily as needed for moderate pain (pain).  . metoprolol tartrate (LOPRESSOR) 25 MG tablet Take 25 mg by mouth 2 (two) times daily.  . diazepam (VALIUM) 10 MG tablet   . gabapentin (NEURONTIN) 300 MG capsule   . nicotine (NICODERM CQ - DOSED IN MG/24 HOURS) 14 mg/24hr patch Place 1 patch (14 mg total) onto the skin daily. (Patient not taking: Reported on 09/17/2014)  . [DISCONTINUED] Acetaminophen-DM (COLD & COUGH DAYTIME PO) Take 30 mLs by mouth every 6 (six) hours as needed (cold symptoms).  . [DISCONTINUED] predniSONE (DELTASONE) 20 MG tablet    No facility-administered encounter medications on file as of 09/17/2014.    Activities of Daily Living In your present state of health, do you have any difficulty performing the following activities: 09/17/2014  Hearing? N  Vision? N  Difficulty concentrating or making decisions? N  Walking or climbing stairs? N  Dressing or bathing? Y  Doing errands, shopping? N    Patient Care Team: Olga Millers, MD as PCP - General (Internal Medicine) Gatha Mayer, MD as Consulting Physician (Gastroenterology) Paralee Cancel, MD as Consulting Physician (Orthopedic Surgery) Melina Schools, MD as Consulting Physician (Orthopedic Surgery) Belva Crome, MD as Consulting Physician (Cardiology) Kristeen Miss, MD as Consulting Physician (Neurosurgery)    Assessment:    Objective:  Personalized Education was given regarding: heart health  Pt determined a personalized goal; see patient goals;    Assessment included: Bone density scan as appropriate at  64 Taking meds without issues; no barriers identified Labs were and fup visit noted with MD if labs are due to be re-drawn. Stress: Recommendations for managing stress if assessed as a factor;  No Risk for hepatitis or high risk social behavior identified via hepatitis screen No international travel; no exposure at this time  Educated on Vaccines; will take tdap today Safety issues reviewed; Cognition assessed by AD8; Score 0 MMSE deferred as the patient stated they had no memory issues; No identified risk were noted; The patient was oriented x 3; appropriate in dress and manner and no objective failures at ADL's or IADL's.     Vaccine status will take tetanus Bone density; will evaluate at 64 Colonoscopy; not due Mammogram/PAP Mammogram;  Eye exam wiill  schedule Hearing ok Dental; completed    Exercise Activities and Dietary recommendations    Goals    None     Fall Risk Fall Risk  09/17/2014  Falls in the past year? No   Depression Screen PHQ 2/9 Scores 09/17/2014  PHQ - 2 Score 0     Cognitive Testing No flowsheet data found.  Immunization History  Administered Date(s) Administered  . Influenza Split 02/27/2011  . Influenza Whole 02/18/2010  . Influenza,inj,Quad PF,36+ Mos 01/22/2013  . Pneumococcal Polysaccharide-23 04/03/2008  . Td 04/03/2004  . Zoster 11/18/2010   Screening Tests Health Maintenance  Topic Date Due  . HIV Screening  03/16/1965  . MAMMOGRAM  02/07/2013  . TETANUS/TDAP  04/03/2014  . INFLUENZA VACCINE  11/02/2014  . COLONOSCOPY  09/18/2015  . ZOSTAVAX  Completed      Plan:  . Plan   The patient agrees to: take the tetanus today and to get [facility]   Advanced directive: no; spouse will determine  Discuss with Doctor on next fup:  Commit to Goals set;      During the course of the visit the patient was educated and counseled about the following appropriate screening and preventive services:   Vaccines to  include Pneumoccal, Influenza, Hepatitis B, Td, Zostavax, HCV  Electrocardiogram  Cardiovascular Disease  Colorectal cancer screening  Bone density screening  Diabetes screening  Glaucoma screening  Mammography/PAP  Nutrition counseling   Patient Instructions (the written plan) was given to the patient.   Wynetta Fines, RN  09/17/2014

## 2014-09-17 NOTE — Patient Instructions (Addendum)
Ms. Harvell , Thank you for taking time to come for your Medicare Wellness Visit. I appreciate your ongoing commitment to your health goals. Please review the following plan we discussed and let me know if I can assist you in the future.   Agreed to take Tdap today Agreed to go to [facility] for mammogram;  These are the goals we discussed: Goals    . less pain     Wants to get back to her regular activity May consider eating more regular meals; monitor diet;   Heart healthy diet;  Fat free or low fat dairy products Fish high in omega-3 acids ( salmon, tuna, trout) Fruits, such as apples, bananas, oranges, pears, prunes Legumes, such as kidney beans, lentils, checkpeas, black-eyed peas and lima bean Vegetables; broccoli, cabbage, carrots Whole grains;   Plant fats are better; decrease "white" foods as pasta, rice, bread and desserts, sugar; Avoid red meat (limiting) palm and coconut oils; sugary foods and beverages  Two nutrients that raise blood chol levels are saturated fats and trans fat; in hydrogenated oils and fats, as stick margarine, baked goods (cookes, cakes, pies, crackers; frosting; and coffee creamers;   Some Fats lower cholesterol: Monounsaturated and polyunsaturated  Avocados Corn, sunflower, and soybean oils Nuts and seeds, such as walnuts Olive, canola, peanut, safflower, and sesame oils Peanut butter Salmon and trout Tofu         This is a list of the screening recommended for you and due dates:  Health Maintenance  Topic Date Due  . HIV Screening  03/16/1966  . Mammogram  02/07/2013  . Tetanus Vaccine  04/03/2014  . Flu Shot  11/02/2014  . Colon Cancer Screening  09/18/2015  . Shingles Vaccine  Completed     Health Maintenance Adopting a healthy lifestyle and getting preventive care can go a long way to promote health and wellness. Talk with your health care provider about what schedule of regular examinations is right for you. This is a good chance for you  to check in with your provider about disease prevention and staying healthy. In between checkups, there are plenty of things you can do on your own. Experts have done a lot of research about which lifestyle changes and preventive measures are most likely to keep you healthy. Ask your health care provider for more information. WEIGHT AND DIET  Eat a healthy diet  Be sure to include plenty of vegetables, fruits, low-fat dairy products, and lean protein.  Do not eat a lot of foods high in solid fats, added sugars, or salt.  Get regular exercise. This is one of the most important things you can do for your health.  Most adults should exercise for at least 150 minutes each week. The exercise should increase your heart rate and make you sweat (moderate-intensity exercise).  Most adults should also do strengthening exercises at least twice a week. This is in addition to the moderate-intensity exercise.  Maintain a healthy weight  Body mass index (BMI) is a measurement that can be used to identify possible weight problems. It estimates body fat based on height and weight. Your health care provider can help determine your BMI and help you achieve or maintain a healthy weight.  For females 93 years of age and older:   A BMI below 18.5 is considered underweight.  A BMI of 18.5 to 24.9 is normal.  A BMI of 25 to 29.9 is considered overweight.  A BMI of 30 and above is considered obese.  Watch levels of cholesterol and blood lipids  You should start having your blood tested for lipids and cholesterol at 65 years of age, then have this test every 5 years.  You may need to have your cholesterol levels checked more often if:  Your lipid or cholesterol levels are high.  You are older than 65 years of age.  You are at high risk for heart disease.  CANCER SCREENING   Lung Cancer  Lung cancer screening is recommended for adults 30-41 years old who are at high risk for lung cancer because  of a history of smoking.  A yearly low-dose CT scan of the lungs is recommended for people who:  Currently smoke.  Have quit within the past 15 years.  Have at least a 30-pack-year history of smoking. A pack year is smoking an average of one pack of cigarettes a day for 1 year.  Yearly screening should continue until it has been 15 years since you quit.  Yearly screening should stop if you develop a health problem that would prevent you from having lung cancer treatment.  Breast Cancer  Practice breast self-awareness. This means understanding how your breasts normally appear and feel.  It also means doing regular breast self-exams. Let your health care provider know about any changes, no matter how small.  If you are in your 20s or 30s, you should have a clinical breast exam (CBE) by a health care provider every 1-3 years as part of a regular health exam.  If you are 42 or older, have a CBE every year. Also consider having a breast X-ray (mammogram) every year.  If you have a family history of breast cancer, talk to your health care provider about genetic screening.  If you are at high risk for breast cancer, talk to your health care provider about having an MRI and a mammogram every year.  Breast cancer gene (BRCA) assessment is recommended for women who have family members with BRCA-related cancers. BRCA-related cancers include:  Breast.  Ovarian.  Tubal.  Peritoneal cancers.  Results of the assessment will determine the need for genetic counseling and BRCA1 and BRCA2 testing. Cervical Cancer Routine pelvic examinations to screen for cervical cancer are no longer recommended for nonpregnant women who are considered low risk for cancer of the pelvic organs (ovaries, uterus, and vagina) and who do not have symptoms. A pelvic examination may be necessary if you have symptoms including those associated with pelvic infections. Ask your health care provider if a screening pelvic  exam is right for you.   The Pap test is the screening test for cervical cancer for women who are considered at risk.  If you had a hysterectomy for a problem that was not cancer or a condition that could lead to cancer, then you no longer need Pap tests.  If you are older than 65 years, and you have had normal Pap tests for the past 10 years, you no longer need to have Pap tests.  If you have had past treatment for cervical cancer or a condition that could lead to cancer, you need Pap tests and screening for cancer for at least 20 years after your treatment.  If you no longer get a Pap test, assess your risk factors if they change (such as having a new sexual partner). This can affect whether you should start being screened again.  Some women have medical problems that increase their chance of getting cervical cancer. If this is the case for  you, your health care provider may recommend more frequent screening and Pap tests.  The human papillomavirus (HPV) test is another test that may be used for cervical cancer screening. The HPV test looks for the virus that can cause cell changes in the cervix. The cells collected during the Pap test can be tested for HPV.  The HPV test can be used to screen women 50 years of age and older. Getting tested for HPV can extend the interval between normal Pap tests from three to five years.  An HPV test also should be used to screen women of any age who have unclear Pap test results.  After 65 years of age, women should have HPV testing as often as Pap tests.  Colorectal Cancer  This type of cancer can be detected and often prevented.  Routine colorectal cancer screening usually begins at 65 years of age and continues through 65 years of age.  Your health care provider may recommend screening at an earlier age if you have risk factors for colon cancer.  Your health care provider may also recommend using home test kits to check for hidden blood in the  stool.  A small camera at the end of a tube can be used to examine your colon directly (sigmoidoscopy or colonoscopy). This is done to check for the earliest forms of colorectal cancer.  Routine screening usually begins at age 52.  Direct examination of the colon should be repeated every 5-10 years through 65 years of age. However, you may need to be screened more often if early forms of precancerous polyps or small growths are found. Skin Cancer  Check your skin from head to toe regularly.  Tell your health care provider about any new moles or changes in moles, especially if there is a change in a mole's shape or color.  Also tell your health care provider if you have a mole that is larger than the size of a pencil eraser.  Always use sunscreen. Apply sunscreen liberally and repeatedly throughout the day.  Protect yourself by wearing long sleeves, pants, a wide-brimmed hat, and sunglasses whenever you are outside. HEART DISEASE, DIABETES, AND HIGH BLOOD PRESSURE   Have your blood pressure checked at least every 1-2 years. High blood pressure causes heart disease and increases the risk of stroke.  If you are between 59 years and 78 years old, ask your health care provider if you should take aspirin to prevent strokes.  Have regular diabetes screenings. This involves taking a blood sample to check your fasting blood sugar level.  If you are at a normal weight and have a low risk for diabetes, have this test once every three years after 65 years of age.  If you are overweight and have a high risk for diabetes, consider being tested at a younger age or more often. PREVENTING INFECTION  Hepatitis B  If you have a higher risk for hepatitis B, you should be screened for this virus. You are considered at high risk for hepatitis B if:  You were born in a country where hepatitis B is common. Ask your health care provider which countries are considered high risk.  Your parents were born in  a high-risk country, and you have not been immunized against hepatitis B (hepatitis B vaccine).  You have HIV or AIDS.  You use needles to inject street drugs.  You live with someone who has hepatitis B.  You have had sex with someone who has hepatitis B.  You get hemodialysis treatment.  You take certain medicines for conditions, including cancer, organ transplantation, and autoimmune conditions. Hepatitis C  Blood testing is recommended for:  Everyone born from [birth year] through 1966.  Anyone with known risk factors for hepatitis C. Sexually transmitted infections (STIs)  You should be screened for sexually transmitted infections (STIs) including gonorrhea and chlamydia if:  You are sexually active and are younger than 65 years of age.  You are older than 65 years of age and your health care provider tells you that you are at risk for this type of infection.  Your sexual activity has changed since you were last screened and you are at an increased risk for chlamydia or gonorrhea. Ask your health care provider if you are at risk.  If you do not have HIV, but are at risk, it may be recommended that you take a prescription medicine daily to prevent HIV infection. This is called pre-exposure prophylaxis (PrEP). You are considered at risk if:  You are sexually active and do not regularly use condoms or know the HIV status of your partner(s).  You take drugs by injection.  You are sexually active with a partner who has HIV. Talk with your health care provider about whether you are at high risk of being infected with HIV. If you choose to begin PrEP, you should first be tested for HIV. You should then be tested every 3 months for as long as you are taking PrEP.  PREGNANCY   If you are premenopausal and you may become pregnant, ask your health care provider about preconception counseling.  If you may become pregnant, take 400 to 800 micrograms (mcg) of folic acid every day.  If you  want to prevent pregnancy, talk to your health care provider about birth control (contraception). OSTEOPOROSIS AND MENOPAUSE   Osteoporosis is a disease in which the bones lose minerals and strength with aging. This can result in serious bone fractures. Your risk for osteoporosis can be identified using a bone density scan.  If you are 53 years of age or older, or if you are at risk for osteoporosis and fractures, ask your health care provider if you should be screened.  Ask your health care provider whether you should take a calcium or vitamin D supplement to lower your risk for osteoporosis.  Menopause may have certain physical symptoms and risks.  Hormone replacement therapy may reduce some of these symptoms and risks. Talk to your health care provider about whether hormone replacement therapy is right for you.  HOME CARE INSTRUCTIONS   Schedule regular health, dental, and eye exams.  Stay current with your immunizations.   Do not use any tobacco products including cigarettes, chewing tobacco, or electronic cigarettes.  If you are pregnant, do not drink alcohol.  If you are breastfeeding, limit how much and how often you drink alcohol.  Limit alcohol intake to no more than 1 drink per day for nonpregnant women. One drink equals 12 ounces of beer, 5 ounces of wine, or 1 ounces of hard liquor.  Do not use street drugs.  Do not share needles.  Ask your health care provider for help if you need support or information about quitting drugs.  Tell your health care provider if you often feel depressed.  Tell your health care provider if you have ever been abused or do not feel safe at home. Document Released: 10/03/2010 Document Revised: 08/04/2013 Document Reviewed: 02/19/2013 [facility] Patient Information 2015 [place], [place]. This information is  not intended to replace advice given to you by you

## 2014-09-18 ENCOUNTER — Telehealth: Payer: Self-pay | Admitting: Internal Medicine

## 2014-09-18 ENCOUNTER — Telehealth: Payer: Self-pay

## 2014-09-18 ENCOUNTER — Encounter: Payer: Self-pay | Admitting: Internal Medicine

## 2014-09-18 DIAGNOSIS — J189 Pneumonia, unspecified organism: Secondary | ICD-10-CM | POA: Insufficient documentation

## 2014-09-18 HISTORY — DX: Pneumonia, unspecified organism: J18.9

## 2014-09-18 NOTE — Progress Notes (Signed)
Medical screening examination/treatment/procedure(s) were performed by non-physician practitioner and as supervising physician I was immediately available for consultation/collaboration. I agree with above. Olga Millers, MD

## 2014-09-18 NOTE — Assessment & Plan Note (Signed)
Treated in April for the infection and most of her symptoms has cleared. Needs x-ray for clearance. Ordered today.

## 2014-09-18 NOTE — Telephone Encounter (Signed)
Pt called in and wanted to know the xray results from yesterday., I seen them but wanted to give her info because i am sure she would have questions

## 2014-09-18 NOTE — Assessment & Plan Note (Signed)
Does not wish to try any other medicines today and does not want to see an orthopedic or spine specialist. Advised that perhaps physical therapy could be helpful and she declines the need for that today. No indication for further imaging today.

## 2014-09-18 NOTE — Telephone Encounter (Signed)
The patient stated she would fup on her mammogram at [facility] and order was placed

## 2014-09-18 NOTE — Progress Notes (Signed)
   Subjective:    Patient ID: Erica [name], female    DOB: October 31, 1950, 64 y.o.   MRN: [id number]  HPI The patient is a 64 YO female who is coming in today for shoulder pain. She was seen at sports medicine and given injection in her shoulders which helped initially. Then she got MRI of her neck due to recurrent pain in her right shoulder blade. She is still having severe pain in the shoulder blade still. She got an injection in her neck which did not help although she did have bulging disc and possible nerve impingement in that area. She has not been stretching. She has used gabapentin which she felt she had a bad response to. She is using the hydrocodone which is helpful and she just deals with the pain to keep doing all her usual activities.   Review of Systems  Constitutional: Negative for fever, chills, activity change, appetite change, fatigue and unexpected weight change.  Respiratory: Negative for cough, chest tightness, shortness of breath and wheezing.   Cardiovascular: Negative for chest pain, palpitations and leg swelling.  Gastrointestinal: Negative for abdominal pain, diarrhea, constipation and abdominal distention.  Musculoskeletal: Positive for myalgias, back pain and arthralgias. Negative for gait problem.  Neurological: Negative.       Objective:   Physical Exam  Constitutional: She is oriented to person, place, and time. She appears well-developed and well-nourished.  HENT:  Head: Normocephalic and atraumatic.  Eyes: EOM are normal.  Neck: Normal range of motion.  Cardiovascular: Normal rate and regular rhythm.   Pulmonary/Chest: Effort normal and breath sounds normal.  Abdominal: Soft. Bowel sounds are normal. She exhibits no distension. There is no tenderness. There is no rebound.  Musculoskeletal: She exhibits no edema.  Some tenderness in the right muscles over the scapula. No radiation to the shoulder or arm.   Neurological: She is alert and oriented to person,  place, and time.  Skin: Skin is warm and dry.   Filed Vitals:   09/17/14 1348 09/17/14 1440  BP: 142/80 116/78  Pulse: 84   Temp: 98.2 F (36.8 C)   TempSrc: Oral   Resp: 12   Height: 5' 2.5" (1.588 m)   Weight: 151 lb (68.493 kg)   SpO2: 94%       Assessment & Plan:

## 2014-09-21 NOTE — Telephone Encounter (Signed)
Patent is calling back regarding her xray results

## 2014-09-21 NOTE — Telephone Encounter (Signed)
Spoke with patient and gave her the cxr results.

## 2014-09-25 ENCOUNTER — Telehealth: Payer: Self-pay | Admitting: Internal Medicine

## 2014-09-25 MED ORDER — HYDROCODONE-ACETAMINOPHEN 5-325 MG PO TABS: 1.0000 | ORAL_TABLET | Freq: Two times a day (BID) | ORAL | Status: DC | PRN

## 2014-09-25 NOTE — Telephone Encounter (Signed)
Patient is requesting a refill of HYDROcodone-acetaminophen (NORCO/VICODIN) 5-325 MG per tablet [[id number]]

## 2014-09-25 NOTE — Telephone Encounter (Signed)
Patient aware and will come pick up

## 2014-09-25 NOTE — Telephone Encounter (Signed)
Printed and signed.  

## 2014-10-23 ENCOUNTER — Telehealth: Payer: Self-pay | Admitting: Internal Medicine

## 2014-10-23 NOTE — Telephone Encounter (Signed)
Patient requesting refill for HYDROcodone-acetaminophen (NORCO/VICODIN) 5-325 MG per tablet [[id number]]

## 2014-10-26 MED ORDER — HYDROCODONE-ACETAMINOPHEN 5-325 MG PO TABS: 1.0000 | ORAL_TABLET | Freq: Two times a day (BID) | ORAL | Status: DC | PRN

## 2014-10-26 NOTE — Telephone Encounter (Signed)
Printed and signed.  

## 2014-10-26 NOTE — Telephone Encounter (Signed)
Left message informing patient she has a prescription ready for pick up at the front desk.

## 2014-11-26 ENCOUNTER — Encounter: Payer: Self-pay | Admitting: Internal Medicine

## 2014-11-26 ENCOUNTER — Ambulatory Visit (INDEPENDENT_AMBULATORY_CARE_PROVIDER_SITE_OTHER): Payer: Medicare HMO | Admitting: Internal Medicine

## 2014-11-26 ENCOUNTER — Ambulatory Visit (INDEPENDENT_AMBULATORY_CARE_PROVIDER_SITE_OTHER)
Admission: RE | Admit: 2014-11-26 | Discharge: 2014-11-26 | Disposition: A | Discharge status: Home / Self Care | Payer: Medicare HMO | Source: Ambulatory Visit | Attending: Internal Medicine | Admitting: Internal Medicine

## 2014-11-26 ENCOUNTER — Telehealth: Payer: Self-pay | Admitting: Internal Medicine

## 2014-11-26 ENCOUNTER — Other Ambulatory Visit: Payer: Self-pay | Admitting: Internal Medicine

## 2014-11-26 VITALS — BP 124/54 | HR 77 | Temp 98.3°F | Resp 16 | Ht 62.5 in | Wt 150.0 lb

## 2014-11-26 DIAGNOSIS — F17210 Nicotine dependence, cigarettes, uncomplicated: Secondary | ICD-10-CM

## 2014-11-26 DIAGNOSIS — J189 Pneumonia, unspecified organism: Secondary | ICD-10-CM | POA: Diagnosis not present

## 2014-11-26 DIAGNOSIS — R9389 Abnormal findings on diagnostic imaging of other specified body structures: Secondary | ICD-10-CM

## 2014-11-26 DIAGNOSIS — Z720 Tobacco use: Secondary | ICD-10-CM | POA: Diagnosis not present

## 2014-11-26 MED ORDER — HYDROCODONE-ACETAMINOPHEN 5-325 MG PO TABS: 1.0000 | ORAL_TABLET | Freq: Two times a day (BID) | ORAL | Status: DC | PRN

## 2014-11-26 MED ORDER — CYCLOBENZAPRINE HCL 10 MG PO TABS: 10.0000 mg | ORAL_TABLET | Freq: Three times a day (TID) | ORAL | Status: DC | PRN

## 2014-11-26 NOTE — Assessment & Plan Note (Signed)
Previous x-ray without full clearance, repeating today. Concern with her history of smoking and if not cleared needs CT chest.

## 2014-11-26 NOTE — Progress Notes (Signed)
Pre visit review using our clinic review tool, if applicable. No additional management support is needed unless otherwise documented below in the visit note. 

## 2014-11-26 NOTE — Patient Instructions (Signed)
We will check the chest x-ray today to make sure that spot in your right lung is back inflated. We will call you with the results.   I have sent in the flexeril and given you the hydrocodone.

## 2014-11-26 NOTE — Assessment & Plan Note (Signed)
Advised her again to quit and she is not able to try to quit at this time.

## 2014-11-26 NOTE — Telephone Encounter (Signed)
Hoyle Sauer calling from [facility] she has a call report for patient. phone# [phone]

## 2014-11-26 NOTE — Telephone Encounter (Signed)
Patient aware. CT has been ordered and blood tests have been ordered.

## 2014-11-26 NOTE — Progress Notes (Signed)
   Subjective:    Patient ID: Erica [name], female    DOB: 08/23/1950, 64 y.o.   MRN: [id number]  HPI The patient is a 64 YO female coming in for follow up of her breathing. She is still smoking and breathing is about the same. She is still coughing some and SOB with exertion. She feels tied from the neck and shoulder pain as she is not sleeping well. She tried some old flexeril for the pain and it helped some. Also using her hydrocodone when needed. She wants to see orthopedics for the pain. No fevers or chills. No change in sputum.   Review of Systems  Constitutional: Negative for fever, chills, activity change, appetite change, fatigue and unexpected weight change.  Respiratory: Negative for cough, chest tightness, shortness of breath and wheezing.   Cardiovascular: Negative for chest pain, palpitations and leg swelling.  Gastrointestinal: Negative for abdominal pain, diarrhea, constipation and abdominal distention.  Musculoskeletal: Positive for myalgias, back pain and arthralgias. Negative for gait problem.  Neurological: Negative.       Objective:   Physical Exam  Constitutional: She is oriented to person, place, and time. She appears well-developed and well-nourished.  HENT:  Head: Normocephalic and atraumatic.  Eyes: EOM are normal.  Neck: Normal range of motion.  Cardiovascular: Normal rate and regular rhythm.   Pulmonary/Chest: Effort normal and breath sounds normal.  Abdominal: Soft. Bowel sounds are normal. She exhibits no distension. There is no tenderness. There is no rebound.  Musculoskeletal: She exhibits no edema.  Some tenderness in the right muscles over the scapula. No radiation to the shoulder or arm.   Neurological: She is alert and oriented to person, place, and time.  Skin: Skin is warm and dry.   Filed Vitals:   11/26/14 1037  BP: 124/54  Pulse: 77  Temp: 98.3 F (36.8 C)  TempSrc: Oral  Resp: 16  Height: 5' 2.5" (1.588 m)  Weight: 150 lb (68.04  kg)  SpO2: 96%      Assessment & Plan:

## 2014-11-27 ENCOUNTER — Other Ambulatory Visit (INDEPENDENT_AMBULATORY_CARE_PROVIDER_SITE_OTHER): Payer: Medicare HMO

## 2014-11-27 DIAGNOSIS — R9389 Abnormal findings on diagnostic imaging of other specified body structures: Secondary | ICD-10-CM

## 2014-11-27 DIAGNOSIS — R938 Abnormal findings on diagnostic imaging of other specified body structures: Secondary | ICD-10-CM

## 2014-11-27 LAB — BASIC METABOLIC PANEL
BUN: 16 mg/dL (ref 6–23)
CO2: 28 mEq/L (ref 19–32)
Calcium: 9.7 mg/dL (ref 8.4–10.5)
Chloride: 106 mEq/L (ref 96–112)
Creatinine, Ser: 0.88 mg/dL (ref 0.40–1.20)
GFR: 68.61 mL/min (ref >60.00)
Glucose, Bld: 93 mg/dL (ref 70–99)
Potassium: 4.5 mEq/L (ref 3.5–5.1)
Sodium: 141 mEq/L (ref 135–145)

## 2014-11-30 NOTE — Telephone Encounter (Signed)
Patient calling for the result of he blood work, please advise

## 2014-11-30 NOTE — Telephone Encounter (Signed)
Left message informing patient of normal labs. 

## 2014-12-09 ENCOUNTER — Inpatient Hospital Stay: Admission: RE | Admit: 2014-12-09 | Payer: Medicare HMO | Source: Ambulatory Visit

## 2014-12-10 ENCOUNTER — Other Ambulatory Visit: Payer: Self-pay | Admitting: Internal Medicine

## 2014-12-10 ENCOUNTER — Ambulatory Visit (INDEPENDENT_AMBULATORY_CARE_PROVIDER_SITE_OTHER)
Admission: RE | Admit: 2014-12-10 | Discharge: 2014-12-10 | Disposition: A | Discharge status: Home / Self Care | Payer: Medicare HMO | Source: Ambulatory Visit | Attending: Internal Medicine | Admitting: Internal Medicine

## 2014-12-10 DIAGNOSIS — R938 Abnormal findings on diagnostic imaging of other specified body structures: Secondary | ICD-10-CM | POA: Diagnosis not present

## 2014-12-10 DIAGNOSIS — R918 Other nonspecific abnormal finding of lung field: Secondary | ICD-10-CM

## 2014-12-10 DIAGNOSIS — R9389 Abnormal findings on diagnostic imaging of other specified body structures: Secondary | ICD-10-CM

## 2014-12-10 MED ORDER — IOHEXOL 300 MG/ML  SOLN
80.0000 mL | Freq: Once | INTRAMUSCULAR | Status: AC | PRN
  Administered 2014-12-10: 80 mL via INTRAVENOUS

## 2014-12-14 ENCOUNTER — Ambulatory Visit (INDEPENDENT_AMBULATORY_CARE_PROVIDER_SITE_OTHER): Payer: Medicare HMO | Admitting: Internal Medicine

## 2014-12-14 ENCOUNTER — Encounter: Payer: Medicare HMO | Admitting: Internal Medicine

## 2014-12-14 ENCOUNTER — Encounter: Payer: Self-pay | Admitting: Internal Medicine

## 2014-12-14 VITALS — BP 130/80 | HR 85 | Ht 62.5 in | Wt 153.0 lb

## 2014-12-14 DIAGNOSIS — J449 Chronic obstructive pulmonary disease, unspecified: Secondary | ICD-10-CM

## 2014-12-14 DIAGNOSIS — J189 Pneumonia, unspecified organism: Secondary | ICD-10-CM

## 2014-12-14 DIAGNOSIS — F17210 Nicotine dependence, cigarettes, uncomplicated: Secondary | ICD-10-CM

## 2014-12-14 DIAGNOSIS — F17200 Nicotine dependence, unspecified, uncomplicated: Secondary | ICD-10-CM

## 2014-12-14 DIAGNOSIS — Z720 Tobacco use: Secondary | ICD-10-CM

## 2014-12-14 NOTE — Progress Notes (Signed)
Subjective:    Patient ID: Erica [name], female    DOB: [birth year]   MRN: [id number]   Brief patient profile:  21 yowf  actively trying to quit smoking  at referral by Dr Basilio Cairo to pulmonary clinic 11/20/2013 with documented GOLD II COPD.   History of Present Illness  11/20/2013 1st [facility] office visit/ Royden Bulman  Chief Complaint  Patient presents with  . PULMONARY CONSULT    Referred by Dr Asa Lente for eval of Emphysema/COPD. Recent PFT.   doe yardwork only  Some am cough and congestion > clear mucus Uses saba maybe once a day in am  rec saba prn Work on stop smoking   12/14/2014 f/u ov/America Sandall re: GOLD II / still smoking  Chief Complaint  Patient presents with  . Follow-up    Pt states that she has been "battling pneumonia since April"- had recent CT Chest on 12/10/14. She c/o sore throat, ear pain and back pain.     Abrupt fatigue in [place] in Aprl 2016/ no unusual cough, no bloody or nasty breathing, no pain breathing already rx abx doesn't  know names then one week prior to OV  Acutely ill with sore throat nasal congesiton 100.6 but resolved by day of ov    No obvious day to day or daytime variability or assoc  Classically pleuritic exertional cp or chest tightness, subjective wheeze or overt sinus or hb symptoms. No unusual exp hx or h/o childhood pna/ asthma or knowledge of premature birth.  Sleeping ok without nocturnal  or early am exacerbation  of respiratory  c/o's or need for noct saba. Also denies any obvious fluctuation of symptoms with weather or environmental changes or other aggravating or alleviating factors except as outlined above.  Current Medications, Allergies, Complete Past Medical History, Past Surgical History, Family History, and Social History were reviewed in [employer] record.  ROS  The following are not active complaints unless bolded sore throat, dysphagia, dental problems, itching, sneezing,  nasal congestion or  excess/ purulent secretions, ear ache,   fever, chills, sweats, unintended wt loss, classically pleuritic or exertional cp, hemoptysis,  orthopnea pnd or leg swelling, presyncope, palpitations, abdominal pain, anorexia, nausea, vomiting, diarrhea  or change in bowel or bladder habits, change in stools or urine, dysuria,hematuria,  rash, arthralgias, visual complaints, headache, numbness, weakness or ataxia or problems with walking or coordination,  change in mood/affect or memory.          Objective:   Physical Exam  amb wf nad   12/14/2014        153  Wt Readings from Last 3 Encounters:  11/20/13 156 lb (70.761 kg)  11/19/13 154 lb 8 oz (70.081 kg)  11/03/13 157 lb 1.9 oz (71.269 kg)      HEENT mild turbinate edema.  Oropharynx no thrush or excess pnd or cobblestoning.  No JVD or cervical adenopathy. Mild accessory muscle hypertrophy. Trachea midline, nl thryroid. Chest was hyperinflated by percussion with diminished breath sounds and moderate increased exp time without wheeze. [name] sign positive at mid inspiration. Regular rate and rhythm without murmur gallop or rub or increase P2 or edema.  Abd: no hsm, nl excursion. Ext warm without cyanosis or clubbing.          I personally reviewed images and agree with radiology impression as follows:  CXR:  12/10/14 1. 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 Recommend  bronchoscopy. 2. COPD.        Assessment & Plan:

## 2014-12-14 NOTE — Patient Instructions (Addendum)
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  The key is to stop smoking completely before smoking completely stops you!   Come to outpatient registration at [facility] (behind the ER) at 715 am Friday 12/18/14  with nothing to eat or drink after midnight Thursday.for Bronchoscopy

## 2014-12-16 ENCOUNTER — Encounter: Payer: Self-pay | Admitting: Internal Medicine

## 2014-12-16 NOTE — Assessment & Plan Note (Signed)
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   I had an extended discussion with the patient reviewing all relevant studies completed to date and  lasting 15 to 20 minutes of a 25 minute visit    Each maintenance medication was reviewed in detail including most importantly the difference between maintenance and prns and under what circumstances the prns are to be triggered using an action plan format that is not reflected in the computer generated alphabetically organized AVS.    Please see instructions for details which were reviewed in writing and the patient given a copy highlighting the part that I personally wrote and discussed at today's ov.

## 2014-12-16 NOTE — Assessment & Plan Note (Signed)
PFT 10/15/2013 FEV1  1.35 (58%) and ratio is 56% and no change p B2 and dlco 73%  - 11/20/2013 p extensive coaching HFA effectiveness =    90%   Adequate control on present rx, reviewed > no change in rx needed

## 2014-12-16 NOTE — Assessment & Plan Note (Signed)

## 2014-12-18 ENCOUNTER — Ambulatory Visit (HOSPITAL_COMMUNITY)
Admission: RE | Admit: 2014-12-18 | Discharge: 2014-12-18 | Disposition: A | Discharge status: Home / Self Care | Payer: Medicare HMO | Source: Ambulatory Visit | Attending: Internal Medicine | Admitting: Internal Medicine

## 2014-12-18 ENCOUNTER — Encounter (HOSPITAL_COMMUNITY): Payer: Self-pay | Admitting: Respiratory Therapy

## 2014-12-18 ENCOUNTER — Encounter (HOSPITAL_COMMUNITY)
Admission: RE | Disposition: A | Discharge status: Home / Self Care | Payer: Self-pay | Source: Ambulatory Visit | Attending: Internal Medicine

## 2014-12-18 DIAGNOSIS — F17210 Nicotine dependence, cigarettes, uncomplicated: Secondary | ICD-10-CM | POA: Diagnosis not present

## 2014-12-18 DIAGNOSIS — J9811 Atelectasis: Secondary | ICD-10-CM | POA: Insufficient documentation

## 2014-12-18 DIAGNOSIS — J449 Chronic obstructive pulmonary disease, unspecified: Secondary | ICD-10-CM | POA: Diagnosis not present

## 2014-12-18 DIAGNOSIS — J189 Pneumonia, unspecified organism: Secondary | ICD-10-CM | POA: Diagnosis not present

## 2014-12-18 HISTORY — PX: VIDEO BRONCHOSCOPY: SHX5072

## 2014-12-18 SURGERY — VIDEO BRONCHOSCOPY WITHOUT FLUORO
Anesthesia: Moderate Sedation | Laterality: Bilateral

## 2014-12-18 MED ORDER — SODIUM CHLORIDE 0.9 % IV SOLN
INTRAVENOUS | Status: DC
  Administered 2014-12-18: 08:00:00 via INTRAVENOUS

## 2014-12-18 MED ORDER — MIDAZOLAM HCL 5 MG/ML IJ SOLN: 1.0000 mg | Freq: Once | INTRAMUSCULAR | Status: DC

## 2014-12-18 MED ORDER — PHENYLEPHRINE HCL 0.25 % NA SOLN: 1.0000 | Freq: Four times a day (QID) | NASAL | Status: DC | PRN

## 2014-12-18 MED ORDER — MIDAZOLAM HCL 10 MG/2ML IJ SOLN
INTRAMUSCULAR | Status: DC | PRN
  Administered 2014-12-18 (×2): 2.5 mg via INTRAVENOUS

## 2014-12-18 MED ORDER — MEPERIDINE HCL 100 MG/ML IJ SOLN
INTRAMUSCULAR | Status: AC
  Filled 2014-12-18: qty 2

## 2014-12-18 MED ORDER — LIDOCAINE HCL 2 % EX GEL
CUTANEOUS | Status: DC | PRN
  Administered 2014-12-18: 1

## 2014-12-18 MED ORDER — MEPERIDINE HCL 25 MG/ML IJ SOLN
INTRAMUSCULAR | Status: DC | PRN
  Administered 2014-12-18: 50 mg via INTRAVENOUS

## 2014-12-18 MED ORDER — LIDOCAINE HCL 1 % IJ SOLN
INTRAMUSCULAR | Status: DC | PRN
  Administered 2014-12-18: 6 mL via RESPIRATORY_TRACT

## 2014-12-18 MED ORDER — LIDOCAINE HCL 2 % EX GEL
1.0000 | Freq: Once | CUTANEOUS | Status: DC
Start: 2014-12-18 — End: 2014-12-18

## 2014-12-18 MED ORDER — MIDAZOLAM HCL 5 MG/ML IJ SOLN
INTRAMUSCULAR | Status: AC
  Filled 2014-12-18: qty 2

## 2014-12-18 MED ORDER — MEPERIDINE HCL 100 MG/ML IJ SOLN: 100.0000 mg | Freq: Once | INTRAMUSCULAR | Status: DC

## 2014-12-18 MED ORDER — PHENYLEPHRINE HCL 0.25 % NA SOLN
NASAL | Status: DC | PRN
  Administered 2014-12-18: 2 via NASAL

## 2014-12-18 NOTE — Op Note (Signed)
Bronchoscopy Procedure Note  Date of Operation: 12/18/2014   Pre-op Diagnosis:  RML atx  Post-op Diagnosis: same  Surgeon: Christinia Gully  Anesthesia: Monitored Local Anesthesia with Sedation  Operation: Video Flexible fiberoptic bronchoscopy, diagnostic   Findings: nl airways, min narrowing RML   Specimen: bal rml  Estimated Blood Loss: none  Complications: none  Indications and History: See updated H and P same date. The risks, benefits, complications, treatment options and expected outcomes were discussed with the patient.  The possibilities of reaction to medication, pulmonary aspiration, perforation of a viscus, bleeding, failure to diagnose a condition and creating a complication requiring transfusion or operation were discussed with the patient who freely signed the consent.    Description of Procedure: The patient was re-examined in the bronchoscopy suite and the site of surgery properly noted/marked.  The patient was identified  and the procedure verified as Flexible Fiberoptic Bronchoscopy.  A Time Out was held and the above information confirmed.   After the induction of topical nasopharyngeal anesthesia, the patient was positioned  and the bronchoscope was passed through the R naris. The vocal cords were visualized and  1% buffered lidocaine 5 ml was topically placed onto the cords. The cords were nl. The scope was then passed into the trachea.  1% buffered lidocaine given topically. Airways inspected bilaterally to the subsegmental level with the following findings:  All airways widely patent to subsegmental level with min narrowing of RML ofice  Interventions:  BAL RML for cytology       The Patient was taken to the Endoscopy Recovery area in satisfactory condition.  Attestation: I performed the procedure.  Christinia Gully, MD Pulmonary and [place] [phone] After 5:30 PM or weekends, call [phone]

## 2014-12-18 NOTE — Progress Notes (Signed)
Video Bronchoscopy done Intervention Bronchial washing done Procedure tolerated well 

## 2014-12-18 NOTE — H&P (Signed)
Brief patient profile:  78 yowf actively trying to quit smoking at referral by Dr Basilio Cairo to pulmonary clinic 11/20/2013 with documented GOLD II COPD.   History of Present Illness  11/20/2013 1st [place] office visit/ Emmer Lillibridge  Chief Complaint  Patient presents with  . PULMONARY CONSULT    Referred by Dr Asa Lente for eval of Emphysema/COPD. Recent PFT.   doe yardwork only  Some am cough and congestion > clear mucus Uses saba maybe once a day in am  rec saba prn Work on stop smoking   12/14/2014 f/u ov/Merrie Epler re: GOLD II / still smoking  Chief Complaint  Patient presents with  . Follow-up    Pt states that she has been "battling pneumonia since April"- had recent CT Chest on 12/10/14. She c/o sore throat, ear pain and back pain.     Abrupt fatigue in [place] in Aprl 2016/ no unusual cough, no bloody or nasty breathing, no pain breathing already rx abx doesn't know names then one week prior to OV Acutely ill with sore throat nasal congesiton 100.6 but resolved by day of ov   No obvious day to day or daytime variability or assoc Classically pleuritic exertional cp or chest tightness, subjective wheeze or overt sinus or hb symptoms. No unusual exp hx or h/o childhood pna/ asthma or knowledge of premature birth.  Sleeping ok without nocturnal or early am exacerbation of respiratory c/o's or need for noct saba. Also denies any obvious fluctuation of symptoms with weather or environmental changes or other aggravating or alleviating factors except as outlined above.  Current Medications, Allergies, Complete Past Medical History, Past Surgical History, Family History, and Social History were reviewed in [employer] record.  ROS The following are not active complaints unless bolded sore throat, dysphagia, dental problems, itching, sneezing, nasal congestion or excess/ purulent secretions, ear ache, fever, chills, sweats, unintended wt  loss, classically pleuritic or exertional cp, hemoptysis, orthopnea pnd or leg swelling, presyncope, palpitations, abdominal pain, anorexia, nausea, vomiting, diarrhea or change in bowel or bladder habits, change in stools or urine, dysuria,hematuria, rash, arthralgias, visual complaints, headache, numbness, weakness or ataxia or problems with walking or coordination, change in mood/affect or memory.      Objective:  Physical Exam  amb wf nad   12/14/2014 153  Wt Readings from Last 3 Encounters:  11/20/13 156 lb (70.761 kg)  11/19/13 154 lb 8 oz (70.081 kg)  11/03/13 157 lb 1.9 oz (71.269 kg)     HEENT mild turbinate edema. Oropharynx no thrush or excess pnd or cobblestoning. No JVD or cervical adenopathy. Mild accessory muscle hypertrophy. Trachea midline, nl thryroid. Chest was hyperinflated by percussion with diminished breath sounds and moderate increased exp time without wheeze. [name] sign positive at mid inspiration. Regular rate and rhythm without murmur gallop or rub or increase P2 or edema. Abd: no hsm, nl excursion. Ext warm without cyanosis or clubbing.      I personally reviewed images and agree with radiology impression as follows:  CXR: 12/10/14 1. 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 Recommend bronchoscopy. 2. COPD.       Assessment & Plan:              COPD GOLD II - Tanda Rockers, MD at 12/16/2014 11:16 AM     Status: Written Related Problem: COPD GOLD II    Expand All Collapse All   PFT 10/15/2013  FEV1 1.35 (58%) and ratio is 56% and no change p B2 and dlco 73%  - 11/20/2013 p extensive coaching HFA effectiveness = 90%   Adequate control on present rx, reviewed > no change in rx needed             Cigarette smoker - Tanda Rockers, MD at 12/16/2014 11:16 AM     Status: Written Related  Problem: Cigarette smoker   Expand All Collapse All   > 3 min discussion I reviewed the Fletcher curve with the patient that basically indicates if you quit smoking when your best day FEV1 is still well preserved (as is clearly the case here) it is highly unlikely you will progress to severe disease and informed the patient there was no medication on the market that has proven to alter the curve/ its downward trajectory or the likelihood of progression of their disease. Therefore stopping smoking and maintaining abstinence is the most important aspect of care, not choice of inhalers or for that matter, doctors.             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   I had an extended discussion with the patient reviewing all relevant studies completed to date and lasting 15 to 20 minutes of a 25 minute visit   Each maintenance medication was reviewed in detail including most importantly the difference between maintenance and prns and under what circumstances the prns are to be triggered using an action plan format that is not reflected in the computer generated alphabetically organized AVS.   Please see instructions for details which were reviewed in writing and the patient given a copy highlighting the part that I personally wrote and discussed at today's ov.          12/18/2014 day of FOB:  No change in hx or exam / min clear mucus no purulent or bloody  secretions   Christinia Gully, MD Pulmonary and [place] [phone] After 5:30 PM or weekends, call [phone]

## 2014-12-18 NOTE — Discharge Instructions (Signed)
Flexible Bronchoscopy, Care After These instructions give you information on caring for yourself after your procedure. Your doctor may also give you more specific instructions. Call your doctor if you have any problems or questions after your procedure. HOME CARE  Do not eat or drink anything for 2 hours after your procedure. If you try to eat or drink before the medicine wears off, food or drink could go into your lungs. You could also burn yourself.  After 2 hours have passed and when you can cough and gag normally, you may eat soft food and drink liquids slowly.  The day after the test, you may eat your normal diet.  You may do your normal activities.  Keep all doctor visits. GET HELP RIGHT AWAY IF:  You get more and more short of breath.  You get light-headed.  You feel like you are going to pass out (faint).  You have chest pain.  You have new problems that worry you.  You cough up more than a little blood.  You cough up more blood than before. MAKE SURE YOU:  Understand these instructions.  Will watch your condition.  Will get help right away if you are not doing well or get worse. Document Released: 01/15/2009 Document Revised: 03/25/2013 Document Reviewed: 11/22/2012 [facility] Patient Information 2015 [place], [place]. This information is not intended to replace advice given to you by your health care provider. Make sure you discuss any questions you have with your health care provider.  Nothing to eat or drink until  10:30 am  Today 12/18/2014.

## 2014-12-21 ENCOUNTER — Encounter (HOSPITAL_COMMUNITY): Payer: Self-pay | Admitting: Internal Medicine

## 2014-12-22 NOTE — Progress Notes (Signed)
Quick Note:  Spoke with pt and notified of results per Dr. Melvyn Novas. Pt verbalized understanding and denied any questions. 11monthreminder placed ______

## 2015-01-10 ENCOUNTER — Other Ambulatory Visit: Payer: Self-pay | Admitting: Interventional Cardiology

## 2015-01-11 ENCOUNTER — Other Ambulatory Visit: Payer: Self-pay | Admitting: Interventional Cardiology

## 2015-01-11 MED ORDER — METOPROLOL TARTRATE 25 MG PO TABS: 25.0000 mg | ORAL_TABLET | Freq: Two times a day (BID) | ORAL | Status: DC

## 2015-02-16 ENCOUNTER — Other Ambulatory Visit: Payer: Self-pay | Admitting: Interventional Cardiology

## 2015-02-16 ENCOUNTER — Telehealth: Payer: Self-pay | Admitting: Internal Medicine

## 2015-02-16 MED ORDER — HYDROCODONE-ACETAMINOPHEN 5-325 MG PO TABS: 1.0000 | ORAL_TABLET | Freq: Two times a day (BID) | ORAL | Status: DC | PRN

## 2015-02-16 NOTE — Telephone Encounter (Signed)
Pt requesting refill for HYDROcodone-acetaminophen (NORCO/VICODIN) 5-325 MG per tablet [[id number]]

## 2015-02-16 NOTE — Telephone Encounter (Signed)
Printed and signed.  

## 2015-03-22 ENCOUNTER — Ambulatory Visit (INDEPENDENT_AMBULATORY_CARE_PROVIDER_SITE_OTHER)
Admission: RE | Admit: 2015-03-22 | Discharge: 2015-03-22 | Disposition: A | Discharge status: Home / Self Care | Payer: Medicare HMO | Source: Ambulatory Visit | Attending: Internal Medicine | Admitting: Internal Medicine

## 2015-03-22 ENCOUNTER — Ambulatory Visit (INDEPENDENT_AMBULATORY_CARE_PROVIDER_SITE_OTHER): Payer: Medicare HMO | Admitting: Internal Medicine

## 2015-03-22 ENCOUNTER — Encounter: Payer: Self-pay | Admitting: Internal Medicine

## 2015-03-22 VITALS — BP 114/70 | HR 67 | Ht 62.5 in | Wt 149.0 lb

## 2015-03-22 DIAGNOSIS — J189 Pneumonia, unspecified organism: Secondary | ICD-10-CM

## 2015-03-22 DIAGNOSIS — J449 Chronic obstructive pulmonary disease, unspecified: Secondary | ICD-10-CM | POA: Diagnosis not present

## 2015-03-22 DIAGNOSIS — Z720 Tobacco use: Secondary | ICD-10-CM

## 2015-03-22 DIAGNOSIS — F17210 Nicotine dependence, cigarettes, uncomplicated: Secondary | ICD-10-CM | POA: Diagnosis not present

## 2015-03-22 DIAGNOSIS — Z23 Encounter for immunization: Secondary | ICD-10-CM | POA: Diagnosis not present

## 2015-03-22 MED ORDER — BUDESONIDE-FORMOTEROL FUMARATE 160-4.5 MCG/ACT IN AERO: INHALATION_SPRAY | RESPIRATORY_TRACT | Status: DC

## 2015-03-22 NOTE — Progress Notes (Signed)
Quick Note:  Spoke with pt. And discussed CXR pt. Understood. Nothing further needed at this time. ______

## 2015-03-22 NOTE — Patient Instructions (Addendum)
Best medication for mucinex dm 1200 mg every 12 hours as needed  symbicort 160 Take 2 puffs first thing in am and then another 2 puffs about 12 hours later.    Only use your albuterol as a rescue medication to be used if you can't catch your breath by resting or doing a relaxed purse lip breathing pattern.  - The less you use it, the better it will work when you need it. - Ok to use up to 2 puffs  every 4 hours if you must but call for immediate appointment if use goes up over your usual need - Don't leave home without it !!  (think of it like the spare tire for your car)   The key is to stop smoking completely before smoking completely stops you!   Prevnar 13 today   Please remember to go to the   x-ray department downstairs for your tests - we will call you with the results when they are available.  Please schedule a follow up visit in 3 months but call sooner if needed

## 2015-03-22 NOTE — Progress Notes (Signed)
Subjective:    Patient ID: Erica [name], female    DOB: 04/13/1950   MRN: [id number]   Brief patient profile:  69 yowf  actively trying to quit smoking  at referral by Dr Basilio Cairo to pulmonary clinic 11/20/2013 with documented GOLD II COPD.   History of Present Illness  11/20/2013 1st [facility] office visit/ [place]  Chief Complaint  Patient presents with  . PULMONARY CONSULT    Referred by Dr Asa Lente for eval of Emphysema/COPD. Recent PFT.   doe yardwork only  Some am cough and congestion > clear mucus Uses saba maybe once a day in am  rec saba prn Work on stop smoking   12/14/2014 f/u ov/[place] re: GOLD II / still smoking  Chief Complaint  Patient presents with  . Follow-up    Pt states that she has been "battling pneumonia since April"- had recent CT Chest on 12/10/14. She c/o sore throat, ear pain and back pain.    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 The key is to stop smoking completely before smoking completely stops you!  FOB   12/18/14>  min narrowing > BAL: cytology negative       03/22/2015  f/u ov/Wert re: GOLD II / still smokin just on saba prn  Chief Complaint  Patient presents with  . Follow-up    Pt states she is currently fighting a cold- she has had SOB, watery eyes, congestion- esp worse in the am. She also has prod cough with clear sputum.  She she takes hydrocodone for pain she has noticed she coughs less.   using delsym not mucinex as rec   No obvious day to day or daytime variability or assoc Classically pleuritic exertional cp or chest tightness, subjective wheeze or overt sinus or hb symptoms. No unusual exp hx or  h/o childhood pna/ asthma or knowledge of premature birth.  Sleeping ok without nocturnal  or early am exacerbation  of respiratory  c/o's or need for noct saba. Also denies any obvious fluctuation of symptoms with weather or environmental changes or other aggravating or alleviating factors except as outlined above.  Current Medications, Allergies, Complete Past Medical History, Past Surgical History, Family History, and Social History were reviewed in [employer] record.  ROS  The following are not active complaints unless bolded sore throat, dysphagia, dental problems, itching, sneezing,  nasal congestion or excess/ purulent secretions, ear ache,   fever, chills, sweats, unintended wt loss, classically pleuritic or exertional cp, hemoptysis,  orthopnea pnd or leg swelling, presyncope, palpitations, abdominal pain, anorexia, nausea, vomiting, diarrhea  or change in bowel or bladder habits, change in stools or urine, dysuria,hematuria,  rash, arthralgias, visual complaints, headache, numbness, weakness or ataxia or problems with walking or coordination,  change in mood/affect or memory.          Objective:   Physical Exam  amb wf nad / vital signs reviewed   12/14/2014        153 >   03/22/2015   149     11/20/13 156 lb (70.761 kg)  11/19/13 154 lb 8  oz (70.081 kg)  11/03/13 157 lb 1.9 oz (71.269 kg)      HEENT mild turbinate edema.  Oropharynx no thrush or excess pnd or cobblestoning.  No JVD or cervical adenopathy. Mild accessory muscle hypertrophy. Trachea midline, nl thryroid. Chest was hyperinflated by percussion with diminished breath sounds and moderate increased exp time without wheeze. [name] sign positive at mid inspiration. Regular rate and rhythm without murmur gallop or rub or increase P2 or edema.  Abd: no hsm, nl excursion. Ext warm without cyanosis or clubbing.         CXR PA and Lateral:   03/22/2015 :    I personally reviewed images and agree with  radiology impression as follows:    Interim partial resolution of right middle lobe atelectasis. Continued follow-up chest x-rays recommended to demonstrate complete resolution.      Assessment & Plan:

## 2015-03-30 NOTE — Assessment & Plan Note (Signed)
PFT 10/15/2013 FEV1  1.35 (58%) and ratio is 56% and no change p B2 and dlco 73%      DDX of  difficult airways management all start with A and  include Adherence, Ace Inhibitors, Acid Reflux, Active Sinus Disease, Alpha 1 Antitripsin deficiency, Anxiety masquerading as Airways dz,  ABPA,  allergy(esp in young), Aspiration (esp in elderly), Adverse effects of meds,  Active smokers, A bunch of PE's (a small clot burden can't cause this syndrome unless there is already severe underlying pulm or vascular dz with poor reserve) plus two Bs  = Bronchiectasis and Beta blocker use..and one C= CHF  Adherence is always the initial "prime suspect" and is a multilayered concern that requires a "trust but verify" approach in every patient - starting with knowing how to use medications, especially inhalers, correctly, keeping up with refills and understanding the fundamental difference between maintenance and prns vs those medications only taken for a very short course and then stopped and not refilled.  - The proper method of use, as well as anticipated side effects, of a metered-dose inhaler are discussed and demonstrated to the patient. Improved effectiveness after extensive coaching during this visit to a level of approximately 75 % from a baseline of 50 %   ? Allergy/ asthma component > trial of symbicort 160 2bid  Active smoking > (see separate a/p)   I had an extended discussion with the patient reviewing all relevant studies completed to date and  lasting 15 to 20 minutes of a 25 minute visit    Each maintenance medication was reviewed in detail including most importantly the difference between maintenance and prns and under what circumstances the prns are to be triggered using an action plan format that is not reflected in the computer generated alphabetically organized AVS.    Please see instructions for details which were reviewed in writing and the patient given a copy highlighting the part that I  personally wrote and discussed at today's ov.

## 2015-03-30 NOTE — Assessment & Plan Note (Signed)
>   3m Discussed the risks and costs (both direct and indirect)  of smoking relative to the benefits of quitting but patient unwilling to commit at this point to a specific quit date.    Offered to help with quitting by use of chantix or referral to our Quit Smart program when the patient is ready.  

## 2015-03-30 NOTE — Assessment & Plan Note (Signed)
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

## 2015-04-04 HISTORY — PX: COLONOSCOPY: SHX174

## 2015-04-24 ENCOUNTER — Ambulatory Visit (INDEPENDENT_AMBULATORY_CARE_PROVIDER_SITE_OTHER): Payer: Medicare Other | Admitting: Internal Medicine

## 2015-04-24 VITALS — BP 110/70 | HR 89 | Temp 98.7°F | Resp 18 | Ht 62.5 in | Wt 146.5 lb

## 2015-04-24 DIAGNOSIS — J449 Chronic obstructive pulmonary disease, unspecified: Secondary | ICD-10-CM | POA: Diagnosis not present

## 2015-04-24 MED ORDER — DOXYCYCLINE HYCLATE 100 MG PO TABS: 100.0000 mg | ORAL_TABLET | Freq: Two times a day (BID) | ORAL | Status: DC

## 2015-04-24 MED ORDER — METHYLPREDNISOLONE ACETATE 80 MG/ML IJ SUSP
80.0000 mg | Freq: Once | INTRAMUSCULAR | Status: AC
Start: 2015-04-24 — End: 2015-04-24
  Administered 2015-04-24: 80 mg via INTRAMUSCULAR

## 2015-04-24 MED ORDER — HYDROCODONE-ACETAMINOPHEN 5-325 MG PO TABS: 1.0000 | ORAL_TABLET | Freq: Two times a day (BID) | ORAL | Status: DC | PRN

## 2015-04-24 NOTE — Progress Notes (Signed)
   Subjective:    Patient ID: Erica [name], female    DOB: [birth year], 65 y.o.   MRN: [id number]  HPI The patient is a 65 YO female coming in for cough which is not getting better. Started about 1-2 weeks ago. Has been using her inhalers more recently but is still getting winded doing routine things. Coughing green sputum for at least a week. No fevers or chills. More SOB. Minimal nasal congestion. Has been taking OTC cold medicines and mucinex and flonase without relief. Overall feels she is worsening.   Review of Systems  Constitutional: Negative for fever, chills, activity change, appetite change, fatigue and unexpected weight change.  HENT: Positive for rhinorrhea. Negative for congestion, postnasal drip, sinus pressure and sore throat.   Eyes: Negative.   Respiratory: Positive for cough, shortness of breath and wheezing. Negative for chest tightness.   Cardiovascular: Negative for chest pain, palpitations and leg swelling.  Gastrointestinal: Negative for abdominal pain, diarrhea, constipation and abdominal distention.  Musculoskeletal: Positive for myalgias, back pain and arthralgias. Negative for gait problem.  Neurological: Negative.       Objective:   Physical Exam  Constitutional: She is oriented to person, place, and time. She appears well-developed and well-nourished.  HENT:  Head: Normocephalic and atraumatic.  Nose: Nose normal.  Mouth/Throat: Oropharynx is clear and moist. No oropharyngeal exudate.  Eyes: EOM are normal.  Neck: Normal range of motion.  Cardiovascular: Normal rate and regular rhythm.   Pulmonary/Chest: Effort normal. No respiratory distress. She has wheezes. She has no rales. She exhibits no tenderness.  Scattered diffuse expiratory wheezing and rare rhonchi.   Abdominal: Soft. Bowel sounds are normal. She exhibits no distension. There is no tenderness. There is no rebound.  Musculoskeletal: She exhibits no edema.  Lymphadenopathy:    She has no  cervical adenopathy.  Neurological: She is alert and oriented to person, place, and time.  Skin: Skin is warm and dry.   Filed Vitals:   04/24/15 0909  BP: 110/70  Pulse: 89  Temp: 98.7 F (37.1 C)  TempSrc: Oral  Resp: 18  Height: 5' 2.5" (1.588 m)  Weight: 146 lb 8 oz (66.452 kg)  SpO2: 92%      Assessment & Plan:  Depo-medrol 80 mg IM given at visit.

## 2015-04-24 NOTE — Progress Notes (Signed)
Pre-visit discussion using our clinic review tool. No additional management support is needed unless otherwise documented below in the visit note.  

## 2015-04-24 NOTE — Patient Instructions (Signed)
We have given you a steroid shot today to help open up the lungs.   We have also sent in doxycycline which is an antibiotic for the cold. Take 1 pill twice a day for 10 days.   If you are not breathing better by Tuesday or Wednesday call us back and if you are feeling worse call us or go to the ER.   Chronic Obstructive Pulmonary Disease Chronic obstructive pulmonary disease (COPD) is a common lung condition in which airflow from the lungs is limited. COPD is a general term that can be used to describe many different lung problems that limit airflow, including both chronic bronchitis and emphysema. If you have COPD, your lung function will probably never return to normal, but there are measures you can take to improve lung function and make yourself feel better. CAUSES   Smoking (common).  Exposure to secondhand smoke.  Genetic problems.  Chronic inflammatory lung diseases or recurrent infections. SYMPTOMS  Shortness of breath, especially with physical activity.  Deep, persistent (chronic) cough with a large amount of thick mucus.  Wheezing.  Rapid breaths (tachypnea).  Gray or bluish discoloration (cyanosis) of the skin, especially in your fingers, toes, or lips.  Fatigue.  Weight loss.  Frequent infections or episodes when breathing symptoms become much worse (exacerbations).  Chest tightness. DIAGNOSIS Your health care provider will take a medical history and perform a physical examination to diagnose COPD. Additional tests for COPD may include:  Lung (pulmonary) function tests.  Chest X-ray.  CT scan.  Blood tests. TREATMENT  Treatment for COPD may include:  Inhaler and nebulizer medicines. These help manage the symptoms of COPD and make your breathing more comfortable.  Supplemental oxygen. Supplemental oxygen is only helpful if you have a low oxygen level in your blood.  Exercise and physical activity. These are beneficial for nearly all people with  COPD.  Lung surgery or transplant.  Nutrition therapy to gain weight, if you are underweight.  Pulmonary rehabilitation. This may involve working with a team of health care providers and specialists, such as respiratory, occupational, and physical therapists. HOME CARE INSTRUCTIONS  Take all medicines (inhaled or pills) as directed by your health care provider.  Avoid over-the-counter medicines or cough syrups that dry up your airway (such as antihistamines) and slow down the elimination of secretions unless instructed otherwise by your health care provider.  If you are a smoker, the most important thing that you can do is stop smoking. Continuing to smoke will cause further lung damage and breathing trouble. Ask your health care provider for help with quitting smoking. He or she can direct you to community resources or hospitals that provide support.  Avoid exposure to irritants such as smoke, chemicals, and fumes that aggravate your breathing.  Use oxygen therapy and pulmonary rehabilitation if directed by your health care provider. If you require home oxygen therapy, ask your health care provider whether you should purchase a pulse oximeter to measure your oxygen level at home.  Avoid contact with individuals who have a contagious illness.  Avoid extreme temperature and humidity changes.  Eat healthy foods. Eating smaller, more frequent meals and resting before meals may help you maintain your strength.  Stay active, but balance activity with periods of rest. Exercise and physical activity will help you maintain your ability to do things you want to do.  Preventing infection and hospitalization is very important when you have COPD. Make sure to receive all the vaccines your health care provider  recommends, especially the pneumococcal and influenza vaccines. Ask your health care provider whether you need a pneumonia vaccine.  Learn and use relaxation techniques to manage  stress.  Learn and use controlled breathing techniques as directed by your health care provider. Controlled breathing techniques include:  Pursed lip breathing. Start by breathing in (inhaling) through your nose for 1 second. Then, purse your lips as if you were going to whistle and breathe out (exhale) through the pursed lips for 2 seconds.  Diaphragmatic breathing. Start by putting one hand on your abdomen just above your waist. Inhale slowly through your nose. The hand on your abdomen should move out. Then purse your lips and exhale slowly. You should be able to feel the hand on your abdomen moving in as you exhale.  Learn and use controlled coughing to clear mucus from your lungs. Controlled coughing is a series of short, progressive coughs. The steps of controlled coughing are: 1. Lean your head slightly forward. 2. Breathe in deeply using diaphragmatic breathing. 3. Try to hold your breath for 3 seconds. 4. Keep your mouth slightly open while coughing twice. 5. Spit any mucus out into a tissue. 6. Rest and repeat the steps once or twice as needed. SEEK MEDICAL CARE IF:  You are coughing up more mucus than usual.  There is a change in the color or thickness of your mucus.  Your breathing is more labored than usual.  Your breathing is faster than usual. SEEK IMMEDIATE MEDICAL CARE IF:  You have shortness of breath while you are resting.  You have shortness of breath that prevents you from:  Being able to talk.  Performing your usual physical activities.  You have chest pain lasting longer than 5 minutes.  Your skin color is more cyanotic than usual.  You measure low oxygen saturations for longer than 5 minutes with a pulse oximeter. MAKE SURE YOU:  Understand these instructions.  Will watch your condition.  Will get help right away if you are not doing well or get worse.   This information is not intended to replace advice given to you by your health care provider.  Make sure you discuss any questions you have with your health care provider.   Document Released: 12/28/2004 Document Revised: 04/10/2014 Document Reviewed: 11/14/2012 Elsevier Interactive Patient Education [employer].

## 2015-04-25 ENCOUNTER — Encounter: Payer: Self-pay | Admitting: Internal Medicine

## 2015-04-25 NOTE — Assessment & Plan Note (Signed)
Continue symbicort and albuterol. Given IM 80 mg depo-medrol at visit and rx for doxycycline as she is in flare. Call back if not improving next week or seek ER care sooner if she is worsening.

## 2015-04-27 ENCOUNTER — Encounter: Payer: Self-pay | Admitting: Interventional Cardiology

## 2015-04-27 ENCOUNTER — Telehealth: Payer: Self-pay | Admitting: Internal Medicine

## 2015-04-27 ENCOUNTER — Ambulatory Visit (INDEPENDENT_AMBULATORY_CARE_PROVIDER_SITE_OTHER): Payer: Medicare Other | Admitting: Interventional Cardiology

## 2015-04-27 VITALS — BP 138/80 | HR 77 | Ht 62.5 in | Wt 149.8 lb

## 2015-04-27 DIAGNOSIS — I5021 Acute systolic (congestive) heart failure: Secondary | ICD-10-CM | POA: Diagnosis not present

## 2015-04-27 DIAGNOSIS — I5181 Takotsubo syndrome: Secondary | ICD-10-CM | POA: Diagnosis not present

## 2015-04-27 DIAGNOSIS — I10 Essential (primary) hypertension: Secondary | ICD-10-CM

## 2015-04-27 DIAGNOSIS — E785 Hyperlipidemia, unspecified: Secondary | ICD-10-CM | POA: Diagnosis not present

## 2015-04-27 LAB — LIPID PANEL
Cholesterol: 175 mg/dL (ref 125–200)
HDL: 43 mg/dL — ABNORMAL LOW (ref >=46)
LDL Cholesterol: 107 mg/dL (ref <130)
Total CHOL/HDL Ratio: 4.1 Ratio (ref <=5.0)
Triglycerides: 123 mg/dL (ref <150)
VLDL: 25 mg/dL (ref <30)

## 2015-04-27 MED ORDER — ASPIRIN EC 81 MG PO TBEC: 81.0000 mg | DELAYED_RELEASE_TABLET | Freq: Every day | ORAL | Status: DC

## 2015-04-27 MED ORDER — METOPROLOL TARTRATE 25 MG PO TABS: 25.0000 mg | ORAL_TABLET | Freq: Two times a day (BID) | ORAL | Status: DC

## 2015-04-27 NOTE — Telephone Encounter (Signed)
Called and advised patient of Dr. Nathanial Millman message. Patient will keep taking doxycycline.

## 2015-04-27 NOTE — Progress Notes (Signed)
Cardiology Office Note   Date:  04/27/2015   ID:  Erica [name], DOB 1950-05-06, MRN [id number]  PCP:  [name], MD  Cardiologist:  Sinclair Grooms, MD   Chief Complaint  Patient presents with  . Coronary Artery Disease      History of Present Illness: Erica [name] is a 65 y.o. female who presents for  History of stress-induced cardiomyopathy 2010, heavy tobacco use , hyperlipidemia, hypertension, an history of chronic diastolic heart failure.   Having terrible difficulty with recurring dyspnea and wheezing. This associated chest tightness/aching. There is no orthopnea or PND. Cough productive of phlegm is also present. No episodes of tachycardia or fainting.    Past Medical History  Diagnosis Date  . Takotsubo syndrome 12/2008  . ANEMIA   . OSTEOARTHRITIS   . URINARY INCONTINENCE   . ADENOCARCINOMA, COLON, HX OF 03/2009  . BACK PAIN, LUMBAR   . HYPERTENSION   . HYPERLIPIDEMIA   . GERD   . Crohn's  02/2010    ileal ulcers, intol of entercort--refuses treatment  . Microscopic hematuria     chronic  . CHF (congestive heart failure) ([place])   . History of transfusion of whole blood   . Obstruction of intestine or colon (HCC)     adhesions  . COPD (chronic obstructive pulmonary disease) with emphysema ([place])   . Rotator cuff tear   . Rectal fissure     Possible fissure    Past Surgical History  Procedure Laterality Date  . Cesarean section      x 3  . Abdominal hysterectomy  1994  . Colon surgery  03/2009    hemicolectomy colon cancer  . Cholecystectomy  1995  . Tonsillectomy  1952  . Appendectomy  1964  . Hand surgery  1991  . Knee surgery  1981    bilateral  . Right knee replacement  03/2010  . Upper gastrointestinal endoscopy  05/16/2010    normal  . Colonoscopy w/ biopsies and polypectomy  01/24/2011    (Crohn's)ileitis, internal hemorrhoids  . Breast surgery    . Video bronchoscopy Bilateral 12/18/2014    Procedure: VIDEO  BRONCHOSCOPY WITHOUT FLUORO;  Surgeon: Tanda Rockers, MD;  Location: [facility];  Service: Cardiopulmonary;  Laterality: Bilateral;     Current Outpatient Prescriptions  Medication Sig Dispense Refill  . albuterol (PROVENTIL HFA;VENTOLIN HFA) 108 (90 BASE) MCG/ACT inhaler Inhale 2 puffs into the lungs daily as needed for wheezing or shortness of breath (shortness of breath).    . budesonide-formoterol (SYMBICORT) 160-4.5 MCG/ACT inhaler Take 2 puffs first thing in am and then another 2 puffs about 12 hours later. 1 Inhaler 11  . cyclobenzaprine (FLEXERIL) 10 MG tablet Take 1 tablet (10 mg total) by mouth 3 (three) times daily as needed for muscle spasms. 60 tablet 0  . dextromethorphan (DELSYM) 30 MG/5ML liquid as directed. Reported on 04/24/2015    . doxycycline (VIBRA-TABS) 100 MG tablet Take 1 tablet (100 mg total) by mouth 2 (two) times daily. 20 tablet 0  . guaiFENesin (MUCINEX) 600 MG 12 hr tablet Take by mouth 2 (two) times daily.    [name] HYDROcodone-acetaminophen (NORCO/VICODIN) 5-325 MG tablet Take 1 tablet by mouth 2 (two) times daily as needed for moderate pain (pain). 60 tablet 0  . metoprolol tartrate (LOPRESSOR) 25 MG tablet TAKE ONE TABLET BY MOUTH TWICE DAILY. NEED OFFICE VISIT 60 tablet 1   No current facility-administered medications for this visit.    Allergies:  Morphine    Social History:  The patient  reports that she quit smoking 10 days ago. Her smoking use included Cigarettes. She has a 24 pack-year smoking history. She has never used smokeless tobacco. She reports that she does not drink alcohol or use illicit drugs.   Family History:  The patient's family history includes Arthritis in her other; Colon cancer in her father and sister; Heart disease in her father; Hypertension in her father; Kidney disease in her father; Throat cancer in her mother.    ROS:  Please see the history of present illness.   Otherwise, review of systems are positive for  Cough, phlegm  production, recurrent upper respiratory infection..   All other systems are reviewed and negative.    PHYSICAL EXAM: VS:  BP 138/80 mmHg  Pulse 77  Ht 5' 2.5" (1.588 m)  Wt 149 lb 12.8 oz (67.949 kg)  BMI 26.95 kg/m2 , BMI Body mass index is 26.95 kg/(m^2). GEN: Well nourished, well developed, in no acute distress HEENT: normal Neck: no JVD, carotid bruits, or masses Cardiac: RRR.  There is no murmur, rub, or gallop. There is no edema. Respiratory:  clear to auscultation bilaterally, normal work of breathing. GI: soft, nontender, nondistended, + BS MS: no deformity or atrophy Skin: warm and dry, no rash Neuro:  Strength and sensation are intact Psych: euthymic mood, full affect   EKG:  EKG is not ordered today.    Recent Labs: 07/13/2014: Hemoglobin 13.6; Platelets 399 11/27/2014: BUN 16; Creatinine, Ser 0.88; Potassium 4.5; Sodium 141    Lipid Panel    Component Value Date/Time   CHOL 225* 09/30/2012 1518   TRIG 238.0* 09/30/2012 1518   HDL 54.40 09/30/2012 1518   CHOLHDL 4 09/30/2012 1518   VLDL 47.6* 09/30/2012 1518   LDLCALC 101* 02/22/2011 1533   LDLDIRECT 138.5 09/30/2012 1518      Wt Readings from Last 3 Encounters:  04/27/15 149 lb 12.8 oz (67.949 kg)  04/24/15 146 lb 8 oz (66.452 kg)  03/22/15 149 lb (67.586 kg)      Other studies Reviewed: Additional studies/ records that were reviewed today include:  Cardiac data. The findings include  none.    ASSESSMENT AND PLAN:  1. Takotsubo syndrome   Resolved in 2010  2. CHF (congestive heart failure), NYHA class I, acute, systolic (HCC)  No evidence of volume overload  3. Essential hypertension  Controlled  4. Hyperlipidemia No t known   5. Recurring chest discomfort this seems to be musculoskeletal related to coughing   Current medicines are reviewed at length with the patient today.  The patient has the following concerns regarding medicines: none.  The following changes/actions have been  instituted:     Counseled to stop smoking   81 mg of aspirin daily   Lipid panel   Aggressive secondary prevention is advocated   Considered stress testing but would be impossible to do with exercise due to COPD and difficulty used pharmacologic stress. I do not believe current symptoms warrant coronary angioma.  Labs/ tests ordered today include:  No orders of the defined types were placed in this encounter.     Disposition:   FU with HS in 1 year  Signed, Sinclair Grooms, MD  04/27/2015 8:44 AM    [address], [place], [place]  [address] Phone: [phone]; Fax: [phone]

## 2015-04-27 NOTE — Telephone Encounter (Signed)
Does not sound like a typical reaction. Would advise to keep taking with food.

## 2015-04-27 NOTE — Telephone Encounter (Signed)
Patient called to advise that she began taking doxycycline (VIBRA-TABS) 100 MG tablet [[id number]] as instructed on Saturday. She states that she is having a burning sensation in her stomach and kidneys, and is concerned that it is a reaction. Please give the patient a call.

## 2015-04-27 NOTE — Patient Instructions (Signed)
Medication Instructions:  Your physician has recommended you make the following change in your medication:  START Aspirin 70m daily   Labwork: Lipid today  Testing/Procedures: None ordered  Follow-Up: Your physician wants you to follow-up in: 1 year with Dr.Smith You will receive a reminder letter in the mail two months in advance. If you don't receive a letter, please call our office to schedule the follow-up appointment.   Any Other Special Instructions Will Be Listed Below (If Applicable).     If you need a refill on your cardiac medications before your next appointment, please call your pharmacy.

## 2015-06-18 ENCOUNTER — Encounter: Payer: Self-pay | Admitting: Internal Medicine

## 2015-06-18 ENCOUNTER — Other Ambulatory Visit (INDEPENDENT_AMBULATORY_CARE_PROVIDER_SITE_OTHER): Payer: Medicare Other

## 2015-06-18 ENCOUNTER — Ambulatory Visit (INDEPENDENT_AMBULATORY_CARE_PROVIDER_SITE_OTHER): Payer: Medicare Other | Admitting: Internal Medicine

## 2015-06-18 VITALS — BP 140/78 | HR 64 | Temp 98.1°F | Resp 14 | Ht 62.5 in | Wt 158.0 lb

## 2015-06-18 DIAGNOSIS — R109 Unspecified abdominal pain: Secondary | ICD-10-CM | POA: Insufficient documentation

## 2015-06-18 DIAGNOSIS — K30 Functional dyspepsia: Secondary | ICD-10-CM | POA: Diagnosis not present

## 2015-06-18 DIAGNOSIS — R1030 Lower abdominal pain, unspecified: Secondary | ICD-10-CM

## 2015-06-18 LAB — BASIC METABOLIC PANEL
BUN: 16 mg/dL (ref 6–23)
CO2: 30 mEq/L (ref 19–32)
Calcium: 9.9 mg/dL (ref 8.4–10.5)
Chloride: 103 mEq/L (ref 96–112)
Creatinine, Ser: 0.78 mg/dL (ref 0.40–1.20)
GFR: 78.72 mL/min (ref >60.00)
Glucose, Bld: 83 mg/dL (ref 70–99)
Potassium: 4.3 mEq/L (ref 3.5–5.1)
Sodium: 140 mEq/L (ref 135–145)

## 2015-06-18 LAB — CBC
HCT: 39.4 % (ref 36.0–46.0)
Hemoglobin: 13.1 g/dL (ref 12.0–15.0)
MCHC: 33.3 g/dL (ref 30.0–36.0)
MCV: 88.4 fl (ref 78.0–100.0)
Platelets: 340 10*3/uL (ref 150.0–400.0)
RBC: 4.46 Mil/uL (ref 3.87–5.11)
RDW: 13.5 % (ref 11.5–15.5)
WBC: 9.7 10*3/uL (ref 4.0–10.5)

## 2015-06-18 NOTE — Progress Notes (Signed)
   Subjective:    Patient ID: Erica [name], female    DOB: October 22, 1950, 65 y.o.   MRN: [id number]  HPI The patient is a 65 YO female coming in for lower abdomen pain, foul urine, smell, some upper back pain. She has had these symptoms for several weeks. No fevers or chills. She has not tried taking anything for it. She is worried about her stomach symptoms as she has a history of colon cancer. She also has had a lesion on her adrenal gland in the past which has not been checked lately. Has had microscopic hematuria in the past and urology evaluation (10+ years ago). Stopped smoking since January and feels like her breathing is improving.   Review of Systems  Constitutional: Negative for fever, chills, activity change, appetite change, fatigue and unexpected weight change.  HENT: Negative for congestion, postnasal drip, sinus pressure and sore throat.   Eyes: Negative.   Respiratory: Negative for cough, chest tightness, shortness of breath and wheezing.   Cardiovascular: Negative for chest pain, palpitations and leg swelling.  Gastrointestinal: Positive for abdominal pain and abdominal distention. Negative for nausea, vomiting, diarrhea and constipation.  Genitourinary: Positive for dysuria, urgency, frequency and flank pain.  Musculoskeletal: Positive for myalgias, back pain and arthralgias. Negative for gait problem.  Neurological: Negative.       Objective:   Physical Exam  Constitutional: She is oriented to person, place, and time. She appears well-developed and well-nourished.  HENT:  Head: Normocephalic and atraumatic.  Eyes: EOM are normal.  Neck: Normal range of motion.  Cardiovascular: Normal rate and regular rhythm.   Pulmonary/Chest: Effort normal and breath sounds normal.  Abdominal: Soft. Bowel sounds are normal. She exhibits distension. She exhibits no mass. There is tenderness. There is no rebound and no guarding.  Minimal distention versus adiposity. Diffuse tenderness  lower abdomen and some flank pain  Musculoskeletal: She exhibits no edema.  Neurological: She is alert and oriented to person, place, and time.  Skin: Skin is warm and dry.   Filed Vitals:   06/18/15 0926  BP: 140/78  Pulse: 64  Temp: 98.1 F (36.7 C)  TempSrc: Oral  Resp: 14  Height: 5' 2.5" (1.588 m)  Weight: 158 lb (71.668 kg)  SpO2: 97%      Assessment & Plan:

## 2015-06-18 NOTE — Patient Instructions (Signed)
We will have you go down to the lab and leave a urine sample and get blood done so we can get the image of the stomach.

## 2015-06-18 NOTE — Assessment & Plan Note (Signed)
Checking CT abdomen with contrast for her prior colon cancer and adrenal incidentaloma. She could have kidney stones with her intermittent hematuria. Also checking U/A for signs of infection which could be causing some suprapubic pain. Treat as appropriate. Due for colonoscopy this year.

## 2015-06-18 NOTE — Progress Notes (Signed)
Pre visit review using our clinic review tool, if applicable. No additional management support is needed unless otherwise documented below in the visit note. 

## 2015-06-19 LAB — URINALYSIS W MICROSCOPIC + REFLEX CULTURE
Bacteria, UA: NONE SEEN [HPF]
Bilirubin Urine: NEGATIVE
Casts: NONE SEEN [LPF]
Crystals: NONE SEEN [HPF]
Glucose, UA: NEGATIVE
Ketones, ur: NEGATIVE
Leukocytes, UA: NEGATIVE
Nitrite: NEGATIVE
Protein, ur: NEGATIVE
Specific Gravity, Urine: 1.014 (ref 1.001–1.035)
Squamous Epithelial / LPF: NONE SEEN [HPF] (ref <=5)
WBC, UA: NONE SEEN WBC/HPF (ref <=5)
Yeast: NONE SEEN [HPF]
pH: 5.5 (ref 5.0–8.0)

## 2015-06-28 ENCOUNTER — Encounter: Payer: Self-pay | Admitting: Internal Medicine

## 2015-06-28 ENCOUNTER — Ambulatory Visit (INDEPENDENT_AMBULATORY_CARE_PROVIDER_SITE_OTHER): Payer: Medicare Other | Admitting: Internal Medicine

## 2015-06-28 ENCOUNTER — Ambulatory Visit (INDEPENDENT_AMBULATORY_CARE_PROVIDER_SITE_OTHER)
Admission: RE | Admit: 2015-06-28 | Discharge: 2015-06-28 | Disposition: A | Discharge status: Home / Self Care | Payer: Medicare Other | Source: Ambulatory Visit | Attending: Internal Medicine | Admitting: Internal Medicine

## 2015-06-28 VITALS — BP 122/78 | HR 65 | Ht 62.5 in | Wt 159.2 lb

## 2015-06-28 DIAGNOSIS — R109 Unspecified abdominal pain: Secondary | ICD-10-CM | POA: Diagnosis not present

## 2015-06-28 DIAGNOSIS — J449 Chronic obstructive pulmonary disease, unspecified: Secondary | ICD-10-CM | POA: Diagnosis not present

## 2015-06-28 DIAGNOSIS — R1030 Lower abdominal pain, unspecified: Secondary | ICD-10-CM | POA: Diagnosis not present

## 2015-06-28 DIAGNOSIS — J189 Pneumonia, unspecified organism: Secondary | ICD-10-CM | POA: Diagnosis not present

## 2015-06-28 MED ORDER — IOPAMIDOL (ISOVUE-300) INJECTION 61%
100.0000 mL | Freq: Once | INTRAVENOUS | Status: AC | PRN
  Administered 2015-06-28: 100 mL via INTRAVENOUS

## 2015-06-28 NOTE — Assessment & Plan Note (Signed)
PFT 10/15/2013 FEV1  1.35 (58%) and ratio is 56% and no change p B2 and dlco 73%   - 03/22/2015  extensive coaching HFA effectiveness =  90%   > trial of symbicort > d/c'd end of Jan 2017 s flare once quit smoking 04/2015  I had an extended final summary discussion with the patient reviewing all relevant studies completed to date and  lasting 15 to 20 minutes of a 25 minute visit on the following issues:      I reviewed the Fletcher curve with the patient that basically indicates  if you quit smoking when your best day FEV1 is still well preserved (as is clearly  the case here)  it is highly unlikely you will progress to severe disease and informed the patient there was no medication on the market that has proven to alter the curve/ its downward trajectory  or the likelihood of progression of their disease.  Therefore stopping smoking and maintaining abstinence is the most important aspect of care, not choice of inhalers or for that matter, doctors.    If symptoms worsen or need for prn saba increase needs to return to this office - otherwise pulmonary f/u is prn

## 2015-06-28 NOTE — Progress Notes (Signed)
Subjective:    Patient ID: Erica [name], female    DOB: 06/05/1950   MRN: [id number]   Brief patient profile:  40 yowf  actively trying to quit smoking  at referral by Dr Basilio Cairo to pulmonary clinic 11/20/2013 with documented GOLD II COPD.   History of Present Illness  11/20/2013 1st [facility] office visit/ Bernadette Armijo  Chief Complaint  Patient presents with  . PULMONARY CONSULT    Referred by Dr Asa Lente for eval of Emphysema/COPD. Recent PFT.   doe yardwork only  Some am cough and congestion > clear mucus Uses saba maybe once a day in am  rec saba prn Work on stop smoking   12/14/2014 f/u ov/Geronimo Diliberto re: GOLD II / still smoking  Chief Complaint  Patient presents with  . Follow-up    Pt states that she has been "battling pneumonia since April"- had recent CT Chest on 12/10/14. She c/o sore throat, ear pain and back pain.    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 The key is to stop smoking completely before smoking completely stops you!  FOB   12/18/14>  min narrowing > BAL: cytology negative       03/22/2015  f/u ov/Ahmeer Tuman re: GOLD II / still smoking just on saba prn  Chief Complaint  Patient presents with  . Follow-up    Pt states she is currently fighting a cold- she has had SOB, watery eyes, congestion- esp worse in the am. She also has prod cough with clear sputum.  She she takes hydrocodone for pain she has noticed she coughs less.   using delsym not mucinex as rec  rec Best medication for mucinex dm 1200 mg every 12 hours as needed symbicort 160 Take 2 puffs first thing in am and then another 2 puffs about 12 hours later.  Only use your  albuterol as a rescue medication to be used if you can't catch your breath by resting or doing a relaxed purse lip breathing pattern.  - The less you use it, the better it will work when you need it. - Ok to use up to 2 puffs  every 4 hours if you must but call for immediate appointment if use goes up over your usual need - Don't leave home without it !!  (think of it like the spare tire for your car)  The key is to stop smoking completely before smoking completely stops you!  Prevnar 13 today       06/28/2015  f/u ov/Omie Ferger re: GOLD II COPD asymptomatic  Chief Complaint  Patient presents with  . Follow-up    Quit smoking end of Jan 2017- breathing has improved. She has not used the symbicort or the albuterol.    now tolerating dancing fine, no am cough, no inhalers at all since end of Jan 2017   No obvious day to day or daytime variability or assoc Classically pleuritic exertional cp or chest tightness, subjective wheeze or overt sinus or hb symptoms. No unusual exp hx or h/o childhood pna/ asthma or knowledge of premature birth.  Sleeping ok without nocturnal  or  early am exacerbation  of respiratory  c/o's or need for noct saba. Also denies any obvious fluctuation of symptoms with weather or environmental changes or other aggravating or alleviating factors except as outlined above.  Current Medications, Allergies, Complete Past Medical History, Past Surgical History, Family History, and Social History were reviewed in [employer] record.  ROS  The following are not active complaints unless bolded sore throat, dysphagia, dental problems, itching, sneezing,  nasal congestion or excess/ purulent secretions, ear ache,   fever, chills, sweats, unintended wt loss, classically pleuritic or exertional cp, hemoptysis,  orthopnea pnd or leg swelling, presyncope, palpitations, abdominal pain for CT Abd 06/28/2015 , anorexia, nausea, vomiting, diarrhea  or change in bowel or bladder  habits, change in stools or urine, dysuria,hematuria,  rash, arthralgias, visual complaints, headache, numbness, weakness or ataxia or problems with walking or coordination,  change in mood/affect or memory.          Objective:   Physical Exam  amb wf nad / vital signs reviewed   12/14/2014        153 >   03/22/2015   149 >  06/28/2015  159     11/20/13 156 lb (70.761 kg)  11/19/13 154 lb 8 oz (70.081 kg)  11/03/13 157 lb 1.9 oz (71.269 kg)      HEENT: nl dentition, turbinates, and oropharynx. Nl external ear canals without cough reflex   NECK :  without JVD/Nodes/TM/ nl carotid upstrokes bilaterally   LUNGS: no acc muscle use,  Nl contour chest which is clear to A and P bilaterally without cough on insp or exp maneuvers   CV:  RRR  no s3 or murmur or increase in P2, no edema   ABD:  soft and nontender with nl inspiratory excursion in the supine position. No bruits or organomegaly, bowel sounds nl  MS:  Nl gait/ ext warm without deformities, calf tenderness, cyanosis or clubbing No obvious joint restrictions   SKIN: warm and dry without lesions    NEURO:  alert, approp, nl sensorium with  no motor deficits         CXR PA and Lateral:   03/22/2015 :    I personally reviewed images and agree with radiology impression as follows:    Interim partial resolution of right middle lobe atelectasis. Continued follow-up chest x-rays recommended to demonstrate complete resolution.      Assessment & Plan:

## 2015-06-28 NOTE — Assessment & Plan Note (Signed)
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

## 2015-06-28 NOTE — Patient Instructions (Signed)
Only use your albuterol as a rescue medication to be used if you can't catch your breath by resting or doing a relaxed purse lip breathing pattern.  - The less you use it, the better it will work when you need it. - Ok to use up to 2 puffs  every 4 hours if you must but call for immediate appointment if use goes up over your usual need (may need more during colds)  - Don't leave home without it !!  (think of it like the spare tire for your car)   If you are satisfied with your treatment plan,  let your doctor know and he/she can either refill your medications or you can return here when your prescription runs out.     If in any way you are not 100% satisfied,  please tell us.  If 100% better, tell your friends!  Pulmonary follow up is as needed

## 2015-06-29 ENCOUNTER — Telehealth: Payer: Self-pay | Admitting: Internal Medicine

## 2015-06-29 NOTE — Telephone Encounter (Signed)
Pt request CT scan result. Please give her a call back  [phone]

## 2015-06-29 NOTE — Telephone Encounter (Signed)
Called and spoke with patient and informed her of her CT results.

## 2015-07-26 ENCOUNTER — Telehealth: Payer: Self-pay | Admitting: *Deleted

## 2015-07-26 MED ORDER — HYDROCODONE-ACETAMINOPHEN 5-325 MG PO TABS: 1.0000 | ORAL_TABLET | Freq: Two times a day (BID) | ORAL | Status: DC | PRN

## 2015-07-26 NOTE — Telephone Encounter (Signed)
Rx printed and signed.  

## 2015-07-26 NOTE — Telephone Encounter (Signed)
Notified pt rx ready for pick-up.../lmb 

## 2015-07-26 NOTE — Telephone Encounter (Signed)
Requesting refill on her hydrocodone...Johny Chess

## 2015-08-15 ENCOUNTER — Observation Stay (HOSPITAL_COMMUNITY)
Admission: EM | Admit: 2015-08-15 | Discharge: 2015-08-16 | Disposition: A | Discharge status: Home / Self Care | Payer: Medicare Other | Attending: Internal Medicine | Admitting: Internal Medicine

## 2015-08-15 DIAGNOSIS — K5660 Unspecified intestinal obstruction: Secondary | ICD-10-CM | POA: Insufficient documentation

## 2015-08-15 DIAGNOSIS — D649 Anemia, unspecified: Secondary | ICD-10-CM | POA: Insufficient documentation

## 2015-08-15 DIAGNOSIS — K629 Disease of anus and rectum, unspecified: Secondary | ICD-10-CM | POA: Diagnosis not present

## 2015-08-15 DIAGNOSIS — K219 Gastro-esophageal reflux disease without esophagitis: Secondary | ICD-10-CM | POA: Diagnosis not present

## 2015-08-15 DIAGNOSIS — E785 Hyperlipidemia, unspecified: Secondary | ICD-10-CM | POA: Insufficient documentation

## 2015-08-15 DIAGNOSIS — M75100 Unspecified rotator cuff tear or rupture of unspecified shoulder, not specified as traumatic: Secondary | ICD-10-CM | POA: Insufficient documentation

## 2015-08-15 DIAGNOSIS — J439 Emphysema, unspecified: Secondary | ICD-10-CM | POA: Insufficient documentation

## 2015-08-15 DIAGNOSIS — R079 Chest pain, unspecified: Secondary | ICD-10-CM | POA: Diagnosis not present

## 2015-08-15 DIAGNOSIS — J441 Chronic obstructive pulmonary disease with (acute) exacerbation: Secondary | ICD-10-CM | POA: Diagnosis present

## 2015-08-15 DIAGNOSIS — I509 Heart failure, unspecified: Secondary | ICD-10-CM | POA: Insufficient documentation

## 2015-08-15 DIAGNOSIS — I10 Essential (primary) hypertension: Secondary | ICD-10-CM | POA: Diagnosis not present

## 2015-08-15 DIAGNOSIS — Z7982 Long term (current) use of aspirin: Secondary | ICD-10-CM | POA: Diagnosis not present

## 2015-08-15 DIAGNOSIS — Z87891 Personal history of nicotine dependence: Secondary | ICD-10-CM | POA: Insufficient documentation

## 2015-08-15 DIAGNOSIS — Z79899 Other long term (current) drug therapy: Secondary | ICD-10-CM | POA: Diagnosis not present

## 2015-08-15 DIAGNOSIS — M1990 Unspecified osteoarthritis, unspecified site: Secondary | ICD-10-CM | POA: Diagnosis not present

## 2015-08-15 DIAGNOSIS — J8410 Pulmonary fibrosis, unspecified: Secondary | ICD-10-CM | POA: Diagnosis not present

## 2015-08-15 DIAGNOSIS — F17210 Nicotine dependence, cigarettes, uncomplicated: Secondary | ICD-10-CM | POA: Diagnosis present

## 2015-08-15 NOTE — ED Provider Notes (Signed)
By signing my name below, I, [place] Oiler, attest that this documentation has been prepared under the direction and in the presence of [place], DO. Electronically Signed: [place] Oiler, ED Scribe. 08/16/2015. 1:38 AM.  TIME SEEN: 11:53 PM  CHIEF COMPLAINT: Chest pain  HPI: Brought in by EMS,  Erica [name] is a 65 y.o. female with PMHx of NSTEMI, Takotsubo syndrome in 2010, HTN, HLD, CHF, COPD, colon cancer s/p R hemicolectomy who presents to the Emergency Department complaining of 10/10 in severity, tight/crushing ("like my chest is caving in"), left chest pain with sudden onset tonight, 15-20 minutes before EMS arrival. She was in her usual state of health all day until tonight before bed. Pt states that prior to the onset of pain she had just taken her usually nightly dose of hydrocodone-acetaminophen for chronic knee pain.  States she was walking upstairs when her symptoms started. Associated symptoms include sweating, SOB, nausea, and vomiting. Pt denies recent fever, cough, bloody stool, and black/tarry stool. On EMS arrival the pt was found to be diaphoretic, hypotensive (80/50), with O2 saturation at 88% on RA, and with a heart rate from the 40s-50s. Her blood pressure was noted by EMS to improve to the 100/50 after 500 ml of IVF and with position change. She was given 324 mg of aspirin by EMS but notes vomiting immediately afterward. Last cardiac catheterization was in 2010, no stents placed at that time. She was diagnosed with colon cancer and it was treated with hemicolectomy and no chemotherapy or radiation was required.   PCP: Dr. Sharlet Salina Cardiologist - Dr. [name]  ROS: See HPI Constitutional: no fever  Eyes: no drainage  ENT: no runny nose   Cardiovascular:  chest pain  Resp: SOB  GI: vomiting GU: no dysuria Integumentary: no rash  Allergy: no hives  Musculoskeletal: no leg swelling  Neurological: no slurred speech ROS otherwise negative  PAST MEDICAL  HISTORY/PAST SURGICAL HISTORY:  Past Medical History  Diagnosis Date  . Takotsubo syndrome 12/2008  . ANEMIA   . OSTEOARTHRITIS   . URINARY INCONTINENCE   . ADENOCARCINOMA, COLON, HX OF 03/2009  . BACK PAIN, LUMBAR   . HYPERTENSION   . HYPERLIPIDEMIA   . GERD   . Crohn's  02/2010    ileal ulcers, intol of entercort--refuses treatment  . Microscopic hematuria     chronic  . CHF (congestive heart failure) ([place])   . History of transfusion of whole blood   . Obstruction of intestine or colon (HCC)     adhesions  . COPD (chronic obstructive pulmonary disease) with emphysema ([place])   . Rotator cuff tear   . Rectal fissure     Possible fissure    MEDICATIONS:  Prior to Admission medications   Medication Sig Start Date End Date Taking? Authorizing Provider  albuterol (PROVENTIL HFA;VENTOLIN HFA) 108 (90 BASE) MCG/ACT inhaler Inhale 2 puffs into the lungs daily as needed for wheezing or shortness of breath (shortness of breath). Reported on 06/18/2015    Historical Provider, MD  aspirin EC 81 MG tablet Take 1 tablet (81 mg total) by mouth daily. 04/27/15   Belva Crome, MD  cyclobenzaprine (FLEXERIL) 10 MG tablet Take 1 tablet (10 mg total) by mouth 3 (three) times daily as needed for muscle spasms. 11/26/14   [name], MD  HYDROcodone-acetaminophen (NORCO/VICODIN) 5-325 MG tablet Take 1 tablet by mouth 2 (two) times daily as needed for moderate pain (pain). 07/26/15   [name], MD  metoprolol tartrate (LOPRESSOR) 25 MG tablet Take 1 tablet (25 mg total) by mouth 2 (two) times daily. 04/27/15   Belva Crome, MD    ALLERGIES:  Allergies  Allergen Reactions  . Morphine Nausea And Vomiting    SOCIAL HISTORY:  Social History  Substance Use Topics  . Smoking status: Former Smoker -- 0.50 packs/day for 48 years    Types: Cigarettes    Quit date: 04/17/2015  . Smokeless tobacco: Never Used  . Alcohol Use: No    FAMILY HISTORY: Family History  Problem Relation  Age of Onset  . Throat cancer Mother   . Colon cancer Father   . Hypertension Father   . Heart disease Father   . Kidney disease Father   . Colon cancer Sister   . Arthritis Other     Parent, other relative    EXAM: BP 109/73 mmHg  Pulse 79  Temp(Src) 97.8 F (36.6 C) (Oral)  Resp 20  Ht 5' 2.5" (1.588 m)  Wt 156 lb (70.761 kg)  BMI 28.06 kg/m2  SpO2 93% CONSTITUTIONAL: Alert and oriented and responds appropriately to questions. Appears uncomfortable, normotensive, heart rate in the 70s HEAD: Normocephalic EYES: Conjunctivae clear, PERRL, no conjunctival pallor ENT: normal nose; no rhinorrhea; slightly dry mucous membranes NECK: Supple, no meningismus, no LAD  CARD: RRR; S1 and S2 appreciated; no murmurs, no clicks, no rubs, no gallops RESP: Normal chest excursion without splinting or tachypnea; breath sounds clear and equal bilaterally; no wheezes, no rhonchi, no rales, no hypoxia or respiratory distress, speaking full sentences, slightly diminished at her bases, oxygen saturation 96% on room air at rest ABD/GI: Normal bowel sounds; non-distended; soft, non-tender, no rebound, no guarding, no peritoneal signs BACK:  The back appears normal and is non-tender to palpation, there is no CVA tenderness EXT: Normal ROM in all joints; non-tender to palpation; no edema; normal capillary refill; no cyanosis, no calf tenderness or swelling; 2+ DP and radial pulses bilaterally   SKIN: Normal color for age and race; no rash; skin is cool and slightly diaphoretic NEURO: Moves all extremities equally, sensation to light touch intact diffusely, cranial nerves II through XII intact PSYCH: The patient's mood and manner are appropriate. Grooming and personal hygiene are appropriate.  MEDICAL DECISION MAKING: Patient here with chest pain, hypotension and bradycardia that have resolved. EKG shows no ischemic abnormality currently and her vital signs have improved. Patient did receive aspirin and then  began vomiting immediately. Will give another dose of aspirin. Will give Bentyl, Zofran. We'll give gentle IV hydration. Last echocardiogram in 2015 showed ejection fraction that had improved from 45% to 60-65%. Will obtain cardiac labs. She does have some diminished aeration at her bases and with her history of COPD, we'll give DuoNeb.  ED PROGRESS: Patient reports some improvement with DuoNeb, fentanyl, nitroglycerin the pain is not completely resolved. We'll give a dose of Dilaudid. She looks much better than she did before. Labs unremarkable including negative troponin, negative d-dimer and normal BNP. Chest x-ray clear.  1:38 AM-Consult complete with Dr. Loleta Books (Hospitalist). Patient case explained and discussed. Agrees to admit patient for further evaluation and treatment. I will place admission orders to telemetry, observation per his request. Care transferred to hospitalist service.    EKG Interpretation  Date/Time:  Monday Aug 16 2015 00:07:42 EDT Ventricular Rate:  68 PR Interval:  232 QRS Duration: 72 QT Interval:  408 QTC Calculation: 433 R Axis:   9 Text Interpretation:  Sinus rhythm with  1st degree A-V block Low voltage QRS Cannot rule out Anterior infarct , age undetermined Abnormal ECG No significant change since last tracing Confirmed by Analei Whinery,  DO, Rechel Delosreyes [id number]) on 08/16/2015 12:10:46 AM        I personally performed the services described in this documentation, which was scribed in my presence. The recorded information has been reviewed and is accurate.      [place], DO 08/16/15 [phone]

## 2015-08-15 NOTE — ED Notes (Signed)
Pt brought to ED by [facility] from home for sharp left CR radiating to her back, hypotension, nausea and vomiting, with some SOB, pt got 324 mg ASA by her own PT EMS arrival to her house, 500 mL NS given by EMS for BP 80/50, BP up to 100/50 after fluids, HR 53, R 24.

## 2015-08-16 ENCOUNTER — Emergency Department (HOSPITAL_COMMUNITY): Payer: Medicare Other

## 2015-08-16 ENCOUNTER — Encounter (HOSPITAL_COMMUNITY): Payer: Self-pay | Admitting: Emergency Medicine

## 2015-08-16 ENCOUNTER — Observation Stay (HOSPITAL_COMMUNITY): Payer: Medicare Other

## 2015-08-16 ENCOUNTER — Observation Stay (HOSPITAL_BASED_OUTPATIENT_CLINIC_OR_DEPARTMENT_OTHER): Payer: Medicare Other

## 2015-08-16 DIAGNOSIS — I509 Heart failure, unspecified: Secondary | ICD-10-CM | POA: Diagnosis not present

## 2015-08-16 DIAGNOSIS — J439 Emphysema, unspecified: Secondary | ICD-10-CM | POA: Diagnosis not present

## 2015-08-16 DIAGNOSIS — J449 Chronic obstructive pulmonary disease, unspecified: Secondary | ICD-10-CM | POA: Diagnosis not present

## 2015-08-16 DIAGNOSIS — J8410 Pulmonary fibrosis, unspecified: Secondary | ICD-10-CM | POA: Diagnosis not present

## 2015-08-16 DIAGNOSIS — I10 Essential (primary) hypertension: Secondary | ICD-10-CM | POA: Diagnosis not present

## 2015-08-16 DIAGNOSIS — R079 Chest pain, unspecified: Secondary | ICD-10-CM | POA: Diagnosis not present

## 2015-08-16 DIAGNOSIS — Z720 Tobacco use: Secondary | ICD-10-CM | POA: Diagnosis not present

## 2015-08-16 LAB — NM MYOCAR MULTI W/SPECT W/WALL MOTION / EF
LV dias vol: 80 mL (ref 46–106)
LV sys vol: 28 mL
RATE: 0.09
SDS: 0
SRS: 7
SSS: 7
TID: 1.26

## 2015-08-16 LAB — CBC WITH DIFFERENTIAL/PLATELET
Basophils Absolute: 0.1 10*3/uL (ref 0.0–0.1)
Basophils Relative: 1 %
Eosinophils Absolute: 0.2 10*3/uL (ref 0.0–0.7)
Eosinophils Relative: 2 %
HCT: 40.8 % (ref 36.0–46.0)
Hemoglobin: 12.9 g/dL (ref 12.0–15.0)
Lymphocytes Relative: 27 %
Lymphs Abs: 2.8 10*3/uL (ref 0.7–4.0)
MCH: 29.5 pg (ref 26.0–34.0)
MCHC: 31.6 g/dL (ref 30.0–36.0)
MCV: 93.2 fL (ref 78.0–100.0)
Monocytes Absolute: 0.6 10*3/uL (ref 0.1–1.0)
Monocytes Relative: 6 %
Neutro Abs: 6.8 10*3/uL (ref 1.7–7.7)
Neutrophils Relative %: 64 %
Platelets: 199 10*3/uL (ref 150–400)
RBC: 4.38 MIL/uL (ref 3.87–5.11)
RDW: 13.3 % (ref 11.5–15.5)
WBC: 10.4 10*3/uL (ref 4.0–10.5)

## 2015-08-16 LAB — CREATININE, SERUM
Creatinine, Ser: 0.84 mg/dL (ref 0.44–1.00)
GFR calc Af Amer: >60 mL/min (ref >60)
GFR calc non Af Amer: >60 mL/min (ref >60)

## 2015-08-16 LAB — CBC
HCT: 40.2 % (ref 36.0–46.0)
Hemoglobin: 12.9 g/dL (ref 12.0–15.0)
MCH: 29.7 pg (ref 26.0–34.0)
MCHC: 32.1 g/dL (ref 30.0–36.0)
MCV: 92.6 fL (ref 78.0–100.0)
Platelets: 219 10*3/uL (ref 150–400)
RBC: 4.34 MIL/uL (ref 3.87–5.11)
RDW: 13.5 % (ref 11.5–15.5)
WBC: 13.2 10*3/uL — ABNORMAL HIGH (ref 4.0–10.5)

## 2015-08-16 LAB — I-STAT TROPONIN, ED: Troponin i, poc: 0 ng/mL (ref 0.00–0.08)

## 2015-08-16 LAB — BASIC METABOLIC PANEL
Anion gap: 7 (ref 5–15)
BUN: 23 mg/dL — ABNORMAL HIGH (ref 6–20)
CO2: 24 mmol/L (ref 22–32)
Calcium: 8.9 mg/dL (ref 8.9–10.3)
Chloride: 111 mmol/L (ref 101–111)
Creatinine, Ser: 0.88 mg/dL (ref 0.44–1.00)
GFR calc Af Amer: >60 mL/min (ref >60)
GFR calc non Af Amer: >60 mL/min (ref >60)
Glucose, Bld: 124 mg/dL — ABNORMAL HIGH (ref 65–99)
Potassium: 3.9 mmol/L (ref 3.5–5.1)
Sodium: 142 mmol/L (ref 135–145)

## 2015-08-16 LAB — BRAIN NATRIURETIC PEPTIDE: B Natriuretic Peptide: 23.6 pg/mL (ref 0.0–100.0)

## 2015-08-16 LAB — TROPONIN I
Troponin I: <0.03 ng/mL (ref <0.031)
Troponin I: <0.03 ng/mL (ref <0.031)
Troponin I: <0.03 ng/mL (ref <0.031)

## 2015-08-16 LAB — D-DIMER, QUANTITATIVE (NOT AT ARMC): D-Dimer, Quant: 0.42 ug/mL-FEU (ref 0.00–0.50)

## 2015-08-16 MED ORDER — SODIUM CHLORIDE 0.9 % IV BOLUS (SEPSIS): 1000.0000 mL | Freq: Once | INTRAVENOUS | Status: DC

## 2015-08-16 MED ORDER — NITROGLYCERIN 0.4 MG SL SUBL
SUBLINGUAL_TABLET | SUBLINGUAL | Status: AC
  Filled 2015-08-16: qty 1

## 2015-08-16 MED ORDER — HYDROMORPHONE HCL 1 MG/ML IJ SOLN
1.0000 mg | Freq: Once | INTRAMUSCULAR | Status: AC
  Administered 2015-08-16: 1 mg via INTRAVENOUS
  Filled 2015-08-16: qty 1

## 2015-08-16 MED ORDER — ONDANSETRON HCL 4 MG/2ML IJ SOLN
4.0000 mg | Freq: Once | INTRAMUSCULAR | Status: AC
  Administered 2015-08-16: 4 mg via INTRAVENOUS
  Filled 2015-08-16: qty 2

## 2015-08-16 MED ORDER — HYDROCODONE-ACETAMINOPHEN 5-325 MG PO TABS
1.0000 | ORAL_TABLET | Freq: Two times a day (BID) | ORAL | Status: DC | PRN
  Administered 2015-08-16: 1 via ORAL
  Filled 2015-08-16: qty 1

## 2015-08-16 MED ORDER — FENTANYL CITRATE (PF) 100 MCG/2ML IJ SOLN
INTRAMUSCULAR | Status: AC
  Filled 2015-08-16: qty 2

## 2015-08-16 MED ORDER — ONDANSETRON HCL 4 MG/2ML IJ SOLN
INTRAMUSCULAR | Status: AC
  Filled 2015-08-16: qty 2

## 2015-08-16 MED ORDER — TECHNETIUM TC 99M TETROFOSMIN IV KIT
30.0000 | PACK | Freq: Once | INTRAVENOUS | Status: AC | PRN
  Administered 2015-08-16: 30 via INTRAVENOUS

## 2015-08-16 MED ORDER — REGADENOSON 0.4 MG/5ML IV SOLN
0.4000 mg | Freq: Once | INTRAVENOUS | Status: AC
  Administered 2015-08-16: 0.4 mg via INTRAVENOUS
  Filled 2015-08-16: qty 5

## 2015-08-16 MED ORDER — IPRATROPIUM-ALBUTEROL 0.5-2.5 (3) MG/3ML IN SOLN
3.0000 mL | Freq: Once | RESPIRATORY_TRACT | Status: AC
  Administered 2015-08-16: 3 mL via RESPIRATORY_TRACT
  Filled 2015-08-16: qty 3

## 2015-08-16 MED ORDER — ASPIRIN 81 MG PO CHEW
324.0000 mg | CHEWABLE_TABLET | Freq: Once | ORAL | Status: AC
Start: 2015-08-16 — End: 2015-08-16
  Administered 2015-08-16: 324 mg via ORAL
  Filled 2015-08-16: qty 4

## 2015-08-16 MED ORDER — ONDANSETRON HCL 4 MG/2ML IJ SOLN
4.0000 mg | Freq: Once | INTRAMUSCULAR | Status: AC
  Administered 2015-08-16: 4 mg via INTRAVENOUS

## 2015-08-16 MED ORDER — GI COCKTAIL ~~LOC~~
30.0000 mL | Freq: Four times a day (QID) | ORAL | Status: DC | PRN
  Administered 2015-08-16: 30 mL via ORAL
  Filled 2015-08-16: qty 30

## 2015-08-16 MED ORDER — NITROGLYCERIN 0.4 MG SL SUBL
0.4000 mg | SUBLINGUAL_TABLET | SUBLINGUAL | Status: DC | PRN
  Administered 2015-08-16 (×2): 0.4 mg via SUBLINGUAL
  Filled 2015-08-16: qty 1

## 2015-08-16 MED ORDER — REGADENOSON 0.4 MG/5ML IV SOLN
INTRAVENOUS | Status: AC
  Administered 2015-08-16: 0.4 mg
  Filled 2015-08-16: qty 5

## 2015-08-16 MED ORDER — ACETAMINOPHEN 325 MG PO TABS: 650.0000 mg | ORAL_TABLET | ORAL | Status: DC | PRN

## 2015-08-16 MED ORDER — SODIUM CHLORIDE 0.9 % IV BOLUS (SEPSIS)
500.0000 mL | Freq: Once | INTRAVENOUS | Status: AC
  Administered 2015-08-16: 500 mL via INTRAVENOUS

## 2015-08-16 MED ORDER — KETOROLAC TROMETHAMINE 30 MG/ML IJ SOLN
30.0000 mg | Freq: Four times a day (QID) | INTRAMUSCULAR | Status: DC | PRN
  Administered 2015-08-16: 30 mg via INTRAVENOUS
  Filled 2015-08-16: qty 1

## 2015-08-16 MED ORDER — TECHNETIUM TC 99M TETROFOSMIN IV KIT
10.0000 | PACK | Freq: Once | INTRAVENOUS | Status: AC | PRN
  Administered 2015-08-16: 10 via INTRAVENOUS

## 2015-08-16 MED ORDER — HYDROMORPHONE HCL 1 MG/ML IJ SOLN
0.5000 mg | INTRAMUSCULAR | Status: DC | PRN
  Administered 2015-08-16 (×2): 0.5 mg via INTRAVENOUS
  Filled 2015-08-16 (×2): qty 1

## 2015-08-16 MED ORDER — ASPIRIN EC 81 MG PO TBEC
81.0000 mg | DELAYED_RELEASE_TABLET | Freq: Every day | ORAL | Status: DC
  Administered 2015-08-16: 81 mg via ORAL
  Filled 2015-08-16: qty 1

## 2015-08-16 MED ORDER — ENOXAPARIN SODIUM 40 MG/0.4ML ~~LOC~~ SOLN: 40.0000 mg | Freq: Every day | SUBCUTANEOUS | Status: DC

## 2015-08-16 MED ORDER — ONDANSETRON HCL 4 MG/2ML IJ SOLN: 4.0000 mg | Freq: Four times a day (QID) | INTRAMUSCULAR | Status: DC | PRN

## 2015-08-16 MED ORDER — FENTANYL CITRATE (PF) 100 MCG/2ML IJ SOLN
50.0000 ug | Freq: Once | INTRAMUSCULAR | Status: AC
  Administered 2015-08-16: 50 ug via INTRAVENOUS

## 2015-08-16 NOTE — Progress Notes (Signed)
Myoview negative for ischemia per Dr Oval Linsey. We will arrange f/u with Dr [name] if pt goes home today.  Kerin Ransom PA-C 08/16/2015 4:44 PM

## 2015-08-16 NOTE — ED Notes (Signed)
Patient transported to X-ray 

## 2015-08-16 NOTE — Discharge Instructions (Signed)

## 2015-08-16 NOTE — Progress Notes (Signed)
Patient ID: Erica [name], female   DOB: 1951/04/03, 65 y.o.   MRN: [id number]  PROGRESS NOTE    Erica [name]  NGE:[id number] DOB: 1951/01/29 DOA: 08/15/2015  PCP: [name], MD   Brief Narrative:  Admission after midnight. For details please refer to admission note done 08/16/2015.  65 year old female with past medical history significant for emphysema/COPD, smoker, hypertension who presented to [facility] with sudden onset left-sided chest pain started the night prior to the admission. Chest pain was sharp, 10 out of 10 in intensity, constant, midsternal and radiating to left and right side associated with lightheadedness, diaphoresis. Chest pain was worse with taking deep breath. Patient to hydrocodone at home with no significant symptomatic relief. She was seen by EMS at which time she was found to be hypotensive and after she was given aspirin 324 mg dose she had one episode of vomiting.  In ED, patient was hemodynamically stable. Blood work was relatively unremarkable. Troponin levels so far are negative. D-dimer was negative. Chest x-ray show emphysematous changes and fibrosis. BNP was 23. Cardiology will see the patient in consultation.   Assessment & Plan:   Principal Problem:   Chest pain - Troponin levels are so far negative - The 12-lead EKG showed sinus rhythm with heart rate of 70 and no acute ischemic changes - Continue daily aspirin - We added Toradol for chest pain which seems to have provided adequate pain relief - Appreciate cardiology consult and recommendations    COPD/Tobacco use - Counseled on smoking cessation   DVT prophylaxis: Lovenox subcutaneous Code Status: full code  Family Communication: No family at bedside Disposition Plan: Home today if cleared by cardiology   Consultants:   Cardiology   Procedures:   None   Antimicrobials:   None    Subjective: Still has chest pain this morning, 7 out of 10 in  intensity  Objective: Filed Vitals:   08/16/15 0115 08/16/15 0130 08/16/15 0244 08/16/15 0600  BP: 109/65 109/73 105/63 123/66  Pulse: 75 79 75   Temp:   97.9 F (36.6 C) 98.1 F (36.7 C)  TempSrc:   Oral Oral  Resp: 21 20 16 18   Height:   5' 2"  (1.575 m)   Weight:   73.12 kg (161 lb 3.2 oz)   SpO2: 92% 93% 95% 96%    Intake/Output Summary (Last 24 hours) at 08/16/15 0917 Last data filed at 08/16/15 0835  Gross per 24 hour  Intake      0 ml  Output      0 ml  Net      0 ml   Filed Weights   08/16/15 0012 08/16/15 0244  Weight: 70.761 kg (156 lb) 73.12 kg (161 lb 3.2 oz)    Examination:  General exam: Appears calm and comfortable  Respiratory system: Clear to auscultation. Respiratory effort normal. Cardiovascular system: S1 & S2 heard, RRR. No JVD, murmurs, rubs, gallops or clicks. No pedal edema. Gastrointestinal system: Abdomen is nondistended, soft and nontender. No organomegaly or masses felt. Normal bowel sounds heard. Central nervous system: Alert and oriented. No focal neurological deficits. Extremities: Symmetric 5 x 5 power. Skin: No rashes, lesions or ulcers Psychiatry: Judgement and insight appear normal. Mood & affect appropriate.   Data Reviewed: I have personally reviewed following labs and imaging studies  CBC:  Recent Labs Lab 08/16/15 0033 08/16/15 0310  WBC 10.4 13.2*  NEUTROABS 6.8  --   HGB 12.9 12.9  HCT 40.8 40.2  MCV 93.2  92.6  PLT 199 208   Basic Metabolic Panel:  Recent Labs Lab 08/16/15 0033 08/16/15 0310  NA 142  --   K 3.9  --   CL 111  --   CO2 24  --   GLUCOSE 124*  --   BUN 23*  --   CREATININE 0.88 0.84  CALCIUM 8.9  --    GFR: Estimated Creatinine Clearance: 62.5 mL/min (by C-G formula based on Cr of 0.84). Liver Function Tests: No results for input(s): AST, ALT, ALKPHOS, BILITOT, PROT, ALBUMIN in the last 168 hours. No results for input(s): LIPASE, AMYLASE in the last 168 hours. No results for input(s):  AMMONIA in the last 168 hours. Coagulation Profile: No results for input(s): INR, PROTIME in the last 168 hours. Cardiac Enzymes:  Recent Labs Lab 08/16/15 0310 08/16/15 0555  TROPONINI <0.03 <0.03   BNP (last 3 results) No results for input(s): PROBNP in the last 8760 hours. HbA1C: No results for input(s): HGBA1C in the last 72 hours. CBG: No results for input(s): GLUCAP in the last 168 hours. Lipid Profile: No results for input(s): CHOL, HDL, LDLCALC, TRIG, CHOLHDL, LDLDIRECT in the last 72 hours. Thyroid Function Tests: No results for input(s): TSH, T4TOTAL, FREET4, T3FREE, THYROIDAB in the last 72 hours. Anemia Panel: No results for input(s): VITAMINB12, FOLATE, FERRITIN, TIBC, IRON, RETICCTPCT in the last 72 hours. Urine analysis:    Component Value Date/Time   COLORURINE YELLOW 06/18/2015 0958   APPEARANCEUR CLEAR 06/18/2015 0958   LABSPEC 1.014 06/18/2015 0958   PHURINE 5.5 06/18/2015 0958   GLUCOSEU NEGATIVE 06/18/2015 0958   GLUCOSEU NEGATIVE 07/18/2013 1009   HGBUR 1+* 06/18/2015 0958   BILIRUBINUR NEGATIVE 06/18/2015 0958   BILIRUBINUR negative 04/17/2014 1016   KETONESUR NEGATIVE 06/18/2015 0958   PROTEINUR NEGATIVE 06/18/2015 0958   PROTEINUR negative 04/17/2014 1016   UROBILINOGEN 0.2 04/17/2014 1016   UROBILINOGEN 0.2 07/18/2013 1009   NITRITE NEGATIVE 06/18/2015 0958   NITRITE negative 04/17/2014 1016   LEUKOCYTESUR NEGATIVE 06/18/2015 0958   Sepsis Labs: @LABRCNTIP (procalcitonin:4,lacticidven:4)   )No results found for this or any previous visit (from the past 240 hour(s)).    Radiology Studies: Dg Chest 2 View 08/16/2015  Emphysematous changes and fibrosis in the lungs. No evidence of active pulmonary disease. Electronically Signed   By: Lucienne Capers M.D.   On: 08/16/2015 01:21    Scheduled Meds: . aspirin EC  81 mg Oral Daily  . enoxaparin (LOVENOX) injection  40 mg Subcutaneous Daily  . fentaNYL      . nitroGLYCERIN      .  ondansetron       Continuous Infusions:      Time spent: 25 minutes  Greater than 50% of the time spent on counseling and coordinating the care.   Leisa Lenz, MD [facility] Pager [phone]  If 7PM-7AM, please contact night-coverage [email or web address] Password [facility] 08/16/2015, 9:17 AM

## 2015-08-16 NOTE — H&P (Addendum)
History and Physical  Patient Name: Erica [name]     WJX:[id number]    DOB: 09-23-1950    DOA: 08/15/2015 PCP: [name], MD  Outpatient specialists:  Cardiology, Daneen Schick      Pulmonology, Christinia Gully  Patient coming from: Home     Chief Complaint: Chest pain  HPI: Erica [name] is a 65 y.o. female with a past medical history significant for HTN, CAD?, and hx Takotsubo who presents with chest pain.  The patient was completely without complaint and pain free until tonight around 11:30PM when she was getting ready for bed, and "all of a sudden" developed severe, dull substernal chest pain, radiating to the back, lightheadednes, sweats, pleuritic pain, and vomiting.  She called her son who called 9-1-1 and gave aspirin 325 mg.  EMS found the patient with BP 80/50, HR 50s, improved with fluids.   ED course: -Afebrile, normotensive, saturating well on room air -Her ECG showed normal rate, no ST changes and 1 AVB and troponin was negative -Na 142, K 3.9, Cr 0.9 (at baseline), WBC 10.4K, Hgb 12.9, platelets normal, dimer negative -Pain improved to a low level with nitroglycerin she thinks and also fentanyl -TRH was asked to admit for observation, serial troponins and risk stratification.  She had stress cardiomyopathy in 2010 around time of her colectomy for colon cancer.  She reports this as "I had a heart attack in 2010", although records (Media tab 02/21/2009) show she had clean coronaries at the time.  She is currently on aspirin, BB, no statin.  She had quit smoking for 4 months, has started back.  She has a father who had heart disease (unclear type) in his old age.      Review of Systems:  Pt complains of chest pain, pleuritic pain, diaphoresis, NBNB vomiting, dyspnea. Pt denies any leg swelling, recent surgery.  All other systems negative except as just noted or noted in the history of present illness.  Past Medical History  Diagnosis Date  . Takotsubo  syndrome 12/2008  . ANEMIA   . OSTEOARTHRITIS   . URINARY INCONTINENCE   . ADENOCARCINOMA, COLON, HX OF 03/2009  . BACK PAIN, LUMBAR   . HYPERTENSION   . HYPERLIPIDEMIA   . GERD   . Crohn's  02/2010    ileal ulcers, intol of entercort--refuses treatment  . Microscopic hematuria     chronic  . CHF (congestive heart failure) ([place])   . History of transfusion of whole blood   . Obstruction of intestine or colon (HCC)     adhesions  . COPD (chronic obstructive pulmonary disease) with emphysema ([place])   . Rotator cuff tear   . Rectal fissure     Possible fissure    Past Surgical History  Procedure Laterality Date  . Cesarean section      x 3  . Abdominal hysterectomy  1994  . Colon surgery  03/2009    hemicolectomy colon cancer  . Cholecystectomy  1995  . Tonsillectomy  1952  . Appendectomy  1964  . Hand surgery  1991  . Knee surgery  1981    bilateral  . Right knee replacement  03/2010  . Upper gastrointestinal endoscopy  05/16/2010    normal  . Colonoscopy w/ biopsies and polypectomy  01/24/2011    (Crohn's)ileitis, internal hemorrhoids  . Breast surgery    . Video bronchoscopy Bilateral 12/18/2014    Procedure: VIDEO BRONCHOSCOPY WITHOUT FLUORO;  Surgeon: Tanda Rockers, MD;  Location: [facility];  Service: Cardiopulmonary;  Laterality: Bilateral;    Social History: Patient is from [place], lived here for about 13 years.  Patient walks unassisted.  Is retired.  Active and drives still, no dementia.  Smokes again.    Allergies  Allergen Reactions  . Morphine Nausea And Vomiting    Family history: family history includes Arthritis in her other; Colon cancer in her father and sister; Heart disease in her father; Hypertension in her father; Kidney disease in her father; Throat cancer in her mother.  Prior to Admission medications   Medication Sig Start Date End Date Taking? Authorizing Provider  albuterol (PROVENTIL HFA;VENTOLIN HFA) 108 (90 BASE) MCG/ACT inhaler  Inhale 2 puffs into the lungs daily as needed for wheezing or shortness of breath (shortness of breath). Reported on 06/18/2015   Yes Historical Provider, MD  aspirin EC 81 MG tablet Take 1 tablet (81 mg total) by mouth daily. 04/27/15  Yes Belva Crome, MD  cyclobenzaprine (FLEXERIL) 10 MG tablet Take 1 tablet (10 mg total) by mouth 3 (three) times daily as needed for muscle spasms. 11/26/14  Yes [name], MD  HYDROcodone-acetaminophen (NORCO/VICODIN) 5-325 MG tablet Take 1 tablet by mouth 2 (two) times daily as needed for moderate pain (pain). 07/26/15  Yes [name], MD  metoprolol tartrate (LOPRESSOR) 25 MG tablet Take 1 tablet (25 mg total) by mouth 2 (two) times daily. 04/27/15  Yes Belva Crome, MD       Physical Exam: BP 109/73 mmHg  Pulse 79  Temp(Src) 97.8 F (36.6 C) (Oral)  Resp 20  Ht 5' 2.5" (1.588 m)  Wt 70.761 kg (156 lb)  BMI 28.06 kg/m2  SpO2 93% General appearance: Well-developed, adult female, alert and in no acute distress, breathing comfortably and speaking in full sentences.   Eyes: Anicteric, conjunctiva pink, lids and lashes normal.     ENT: No nasal deformity, discharge, or epistaxis.  OP moist without lesions.   Skin: Warm and dry.   Cardiac: RRR, nl S1-S2, no murmurs appreciated.  Capillary refill is brisk.  JVP normal.  No LE edema.  Radial and DP pulses 2+ and symmetric.  No carotid bruits. Respiratory: Normal respiratory rate and rhythm.  CTAB without rales or wheezes, coarse bilaterally. Abdomen: Abdomen soft without rigidity.  No TTP. No ascites, distension.   MSK: No deformities or effusions.   Pain not fully reproduced with palpation of precordium on my exam.  No pain with arm movement. Neuro: Sensorium intact and responding to questions, attention normal.  Speech is fluent.  Moves all extremities equally and with normal coordination.    Psych: Behavior appropriate.  Affect normal.  No evidence of aural or visual hallucinations or  delusions.       Labs on Admission:  The metabolic panel shows Normal electrolytes and renal function. D-dimer negative. BNP normal. The complete blood count shows no leukocytosis, anemia, thrombocytopenia. The initial troponin is negative.  Radiological Exams on Admission: Personally reviewed, mediastinum normal, no focal opacities in the lungs. Dg Chest 2 View  08/16/2015  CLINICAL DATA:  Chest pain with left arm pain and shortness of breath tonight. EXAM: CHEST  2 VIEW COMPARISON:  03/22/2015 FINDINGS: Normal heart size and pulmonary vascularity. Emphysematous changes in the lungs with fine interstitial pattern likely representing fibrosis. No focal consolidation or airspace disease. No blunting of costophrenic angles. No pneumothorax. Mediastinal contours appear intact. Degenerative changes in the spine and shoulders. IMPRESSION: Emphysematous changes and fibrosis in the lungs. No  evidence of active pulmonary disease. Electronically Signed   By: Lucienne Capers M.D.   On: 08/16/2015 01:21    EKG: Independently reviewed. Rate 68, QTC 433, first-degree AV block, no ST segment changes.   Echocardiogram 2015: EF 06-01%, grade 1 diastolic dysfunction, no significant valvular disease.   Assessment/Plan 1. Chest pain: This is new.  The patient has HEART score of 5. Angina is possible.  Other potential causes of chest pain (PE, dissection, pancreatitis, pneumonia/effusion) have been ruled out with normal CXR, d-dimer, exam and are doubted.  We have been asked to admit the patient for observation and etiology consultation with Cardiology tomorrow.  -Serial troponins are ordered -Telemetry -Pt is NPO for possible stress (has been deferred by Dr. [name] in the past because of her COPD) -Consult to cardiology, appreciate recommendations -Smoking cessation was recommended, nursing teaching ordered   2. HTN:  -Hold metoprolol for possible stress test  3. COPD:  FEV1 58%  -Albuterol  PRN       DVT prophylaxis: Lovenox Diet: NPO after 4am for anticipated stress testing Code Status: Full  Family Communication: None present  Disposition Plan: Anticipate overnight observation for arrhythmia on telemetry, serial troponins and subsequent risk stratification by Cardiology.  If testing negative, home after. Consults called: Cardiology, notified via electronic message Admission status: Telemetry, OBS   Medical decision making: Patient seen at 2:17 AM on 08/16/2015.  The patient was discussed with Dr. Leonides Schanz. What exists of the patient's chart was reviewed in depth.  Clinical condition: stable.      Edwin Dada [facility] Pager [phone]

## 2015-08-16 NOTE — Care Management Obs Status (Addendum)
[place] NOTIFICATION   Patient Details  Name: Erica [name] MRN: [id number] Date of Birth: Jun 24, 1950   Medicare Observation Status Notification Given:  Yes    Sharin Mons, RN 08/16/2015, 8:54 AM

## 2015-08-16 NOTE — Consult Note (Addendum)
Cardiology Consult    Patient ID: Erica [name] MRN: [id number], DOB/AGE: [id number]   Admit date: 08/15/2015 Date of Consult: 08/16/2015  Primary Physician: [name], MD Primary Cardiologist: Dr. [name] Requesting Provider: Dr. Enid Derry  Patient Profile    65 yo female with PMH of stress induced cardiomyopathy/HLD/HTN/CHF/Colon Ca/COPD and smoking who presented to the [facility] ED on 08/15/2015 chest pain.   Past Medical History   Past Medical History  Diagnosis Date  . Takotsubo syndrome 12/2008  . ANEMIA   . OSTEOARTHRITIS   . URINARY INCONTINENCE   . ADENOCARCINOMA, COLON, HX OF 03/2009  . BACK PAIN, LUMBAR   . HYPERTENSION   . HYPERLIPIDEMIA   . GERD   . Crohn's  02/2010    ileal ulcers, intol of entercort--refuses treatment  . Microscopic hematuria     chronic  . CHF (congestive heart failure) ([place])   . History of transfusion of whole blood   . Obstruction of intestine or colon (HCC)     adhesions  . COPD (chronic obstructive pulmonary disease) with emphysema ([place])   . Rotator cuff tear   . Rectal fissure     Possible fissure    Past Surgical History  Procedure Laterality Date  . Cesarean section      x 3  . Abdominal hysterectomy  1994  . Colon surgery  03/2009    hemicolectomy colon cancer  . Cholecystectomy  1995  . Tonsillectomy  1952  . Appendectomy  1964  . Hand surgery  1991  . Knee surgery  1981    bilateral  . Right knee replacement  03/2010  . Upper gastrointestinal endoscopy  05/16/2010    normal  . Colonoscopy w/ biopsies and polypectomy  01/24/2011    (Crohn's)ileitis, internal hemorrhoids  . Breast surgery    . Video bronchoscopy Bilateral 12/18/2014    Procedure: VIDEO BRONCHOSCOPY WITHOUT FLUORO;  Surgeon: Tanda Rockers, MD;  Location: [facility];  Service: Cardiopulmonary;  Laterality: Bilateral;     Allergies  Allergies  Allergen Reactions  . Morphine Nausea And Vomiting    History of Present Illness      Erica [name] is a 65 yo female patient of Dr. [name] with a PMH of stress induced cardiomyopathy/HLD/HTN/CHF/Colon Ca/COPD and smoking. Her last visit was 04/2015 with Dr. [name], and she was noted to c/o chest pain/aching with her current medications being ASA, BB, inhalers and flexeril. At this visit it was stressed to her the importance of smoking cessation, and outpatient stress testing was considered if these symptoms continued.   Most recently she presented to the ED with c/o sudden onset of left sided chest pressure/sharp pain while getting ready for bed around 1030pm last night. Also reported, light-headedness, back pain and pleuritic pain that was intense. States she has already taken her home dose of hydrocodone-acetaminophen for her normal back pain, and this did not help with her pain. She proceeded to call EMS for transport. On EMS arrival she was noted to be hypotensive, and diaphoretic. Was given 324 ASA with EMS, but reports vomiting immediately afterwards.   On arrival to the ED, she received IV fluids, repeat dose of ASA, with duo-nebs. Labs included Cr 0.88, BNP 23.6, Hgb 12.9, neg d-dimer. POC troponin was negative, and serial enzymes had trended negative. Chest x-ray showed no active disease. EKG showed a sinus rhythm with no acute ST/Twave abnormalities. Vital signs were stable. [facility] was called for admission.   Cardiology has been consulted in  relation to her reports of chest pain.   Inpatient Medications    . aspirin EC  81 mg Oral Daily  . enoxaparin (LOVENOX) injection  40 mg Subcutaneous Daily  . fentaNYL      . nitroGLYCERIN      . ondansetron        Family History    Family History  Problem Relation Age of Onset  . Throat cancer Mother   . Colon cancer Father   . Hypertension Father   . Heart disease Father   . Kidney disease Father   . Colon cancer Sister   . Arthritis Other     Parent, other relative    Social History    Social History   Social  History  . Marital Status: Married    Spouse Name: N/A  . Number of Children: N/A  . Years of Education: N/A   Occupational History  . Retired     Social History Main Topics  . Smoking status: Current Every Day Smoker -- 0.50 packs/day for 48 years    Types: Cigarettes  . Smokeless tobacco: Never Used  . Alcohol Use: No  . Drug Use: No  . Sexual Activity: Not on file   Other Topics Concern  . Not on file   Social History Narrative   Lives with husband and 2 grand dtr; 9 and 16        Review of Systems    General:  No chills, fever, night sweats or weight changes.  Cardiovascular:  + chest pain, dyspnea on exertion, edema, orthopnea, palpitations, paroxysmal nocturnal dyspnea. Dermatological: No rash, lesions/masses Respiratory: No cough, + dyspnea, + pleuritic pain  Urologic: No hematuria, dysuria Abdominal:   No nausea, vomiting, diarrhea, bright red blood per rectum, melena, or hematemesis Neurologic:  No visual changes, wkns, changes in mental status. Musk: + pain with palpation to chest All other systems reviewed and are otherwise negative except as noted above.  Physical Exam    Blood pressure 123/66, pulse 75, temperature 98.1 F (36.7 C), temperature source Oral, resp. rate 18, height 5' 2"  (1.575 m), weight 161 lb 3.2 oz (73.12 kg), SpO2 96 %.  General: Pleasant older female, NAD Psych: Normal affect. Neuro: Alert and oriented X 3. Moves all extremities spontaneously. HEENT: Normal  Neck: Supple without bruits or JVD. Lungs:  Resp regular and unlabored, Slightly diminished in lower lobes. Heart: RRR no s3, s4, or murmurs. Abdomen: Soft, non-tender, non-distended, BS + x 4.  Extremities: No clubbing, cyanosis or edema. DP/PT/Radials 2+ and equal bilaterally.  Labs    Troponin [facility] Test)  Recent Labs  08/16/15 0051  TROPIPOC 0.00    Recent Labs  08/16/15 0310 08/16/15 0555  TROPONINI <0.03 <0.03   Lab Results  Component Value Date    WBC 13.2* 08/16/2015   HGB 12.9 08/16/2015   HCT 40.2 08/16/2015   MCV 92.6 08/16/2015   PLT 219 08/16/2015     Recent Labs Lab 08/16/15 0033 08/16/15 0310  NA 142  --   K 3.9  --   CL 111  --   CO2 24  --   BUN 23*  --   CREATININE 0.88 0.84  CALCIUM 8.9  --   GLUCOSE 124*  --    Lab Results  Component Value Date   CHOL 175 04/27/2015   HDL 43* 04/27/2015   LDLCALC 107 04/27/2015   TRIG 123 04/27/2015   Lab Results  Component Value Date  DDIMER 0.42 08/16/2015     Radiology Studies    Dg Chest 2 View  08/16/2015  CLINICAL DATA:  Chest pain with left arm pain and shortness of breath tonight. EXAM: CHEST  2 VIEW COMPARISON:  03/22/2015 FINDINGS: Normal heart size and pulmonary vascularity. Emphysematous changes in the lungs with fine interstitial pattern likely representing fibrosis. No focal consolidation or airspace disease. No blunting of costophrenic angles. No pneumothorax. Mediastinal contours appear intact. Degenerative changes in the spine and shoulders. IMPRESSION: Emphysematous changes and fibrosis in the lungs. No evidence of active pulmonary disease. Electronically Signed   By: Lucienne Capers M.D.   On: 08/16/2015 01:21    ECG & Cardiac Imaging    ECHO: 7/172015  Study Conclusions  - Left ventricle: The cavity size was normal. Systolic function was normal. The estimated ejection fraction was in the range of 60% to 65%. Doppler parameters are consistent with abnormal left ventricular relaxation (grade 1 diastolic dysfunction).  EKG: 08/16/2015 SR Rate-70s No acute ST/T wave abnormalities.    Assessment & Plan    1. Chest pain With moderate risk for cardiac etiology: Reports a sudden onset of left sided chest pain around 10:30pm while getting ready for bed last night. This was associated with light-headedness, diaphoresis, and pleuritic pain. Did take a home dose of hydrocodone that did not alleviate the pain. Called for EMS and noted to be  hypotensive on arrival, and one episode of vomiting after receiving 324 asa. Labs were stable in the ED, with negative troponin trend. Negative chest x-ray. Rehydrated with IV fluids, and given a dose of dilaudid, she reports helped slightly with the pain.  --Had a cath in 2010 with normal coronaries, and last echo in 10/2013 showed a normal EF of 60-65% --EKG showed SR Rate-70s with no ST/T wave abnormalities, along with negative trop x3. --Last Lipid panel showed good control in January --At her last visit in the office she reported similar symptoms, and outpatient stress testing was considered. Probably could not do an exercise test given her COPD. --Stress test would help to risk stratify --Given her reported symptoms of severe pleuritic pain, dyspnea, with pain with palpation of the chest wall, I feel that this is not necessarily anginal symptoms and may been more likely related to her COPD. States she recently started smoking again.    2. HTN: Currently controlled, BB held this morning by primary team for possible stress test.   3. COPD: Worsening of this could be contributing to her reported pleuritic pain. Chest x-ray was negative, WBC has increased overnight from 10.4>>13.2?   4. Smoking Cessation  -Dr. Ellyn Hack to see patient  Signed, Reino Bellis, NP-C Pager [phone] 08/16/2015, 8:53 AM    I saw and evaluated the patient after she returned from her Myoview stress test. Based on the initial discussion with Reino Bellis, NP-C, we felt the best course of action would be to proceed with a noninvasive evaluation for ischemia based on her symptoms.  On further discussion, it sounds like this is probably musculoskeletal in nature, however she has had intermittent symptoms like this for some time and the plan was to do an ischemic evaluation if it were to occur again. Therefore we have sent her for stress test. Not noticing any more significant symptoms now, just a little bit of  soreness which seems to be off reproducible on exam.   Otherwise relatively stable from medics and in cardiac standpoint.  Addendum: I reviewed the Myoview which was read  as low risk with no sign of ischemia or infarction. This would be relatively reassuring that she has no active cardiac issues. Would be okay to discharge from a cardiac standpoint.   Glenetta Hew, M.D., M.S. Interventional Cardiologist   Pager # [phone] Phone # [phone] [address] [place], [place] [address]

## 2015-08-16 NOTE — ED Notes (Signed)
hospitalist at the bedside 

## 2015-08-16 NOTE — Discharge Summary (Signed)
Physician Discharge Summary  Erica [name] KXF:[id number] DOB: 04-Jul-1950 DOA: 08/15/2015  PCP: [name], MD  Admit date: 08/15/2015 Discharge date: 08/16/2015  Recommendations for Outpatient Follow-up:  1. No changes in medications on discharge   Discharge Diagnoses:  Principal Problem:   Chest pain Active Problems:   Essential hypertension   Cigarette smoker   COPD  GOLD II     Discharge Condition: stable; pt reports she feels good to go home today   Diet recommendation: as tolerated   History of present illness:  65 year old female with past medical history significant for emphysema/COPD, smoker, hypertension who presented to [facility] with sudden onset left-sided chest pain started the night prior to the admission. Chest pain was sharp, 10 out of 10 in intensity, constant, midsternal and radiating to left and right side associated with lightheadedness, diaphoresis. Chest pain was worse with taking deep breath. Patient to hydrocodone at home with no significant symptomatic relief. She was seen by EMS at which time she was found to be hypotensive and after she was given aspirin 324 mg dose she had one episode of vomiting.  In ED, patient was hemodynamically stable. Blood work was relatively unremarkable. Troponin levels so far are negative. D-dimer was negative. Chest x-ray show emphysematous changes and fibrosis. BNP was 23. Cardiology will see the patient in consultation.  Hospital Course:   Assessment & Plan:  Principal Problem:  Chest pain - Troponin levels negative x 3 - The 12-lead EKG showed sinus rhythm - Continue daily aspirin - Chest pain was controlled with toradol - Stress test was normal    COPD/Tobacco use - Counseled on smoking cessation    Essential hypertension - Continue metoprolol    DVT prophylaxis: Lovenox subcutaneous Code Status: full code  Family Communication: No family at bedside    Consultants:    Cardiology  Procedures:   Stress test 08/16/2015 - normal   Antimicrobials:   None   Signed:  Leisa Lenz, MD  [facility] 08/16/2015, 5:43 PM  Pager #: [phone]  Time spent in minutes: less than 30 minutes   Discharge Exam: Filed Vitals:   08/16/15 1110 08/16/15 1500  BP:  116/65  Pulse: 103 82  Temp:  98.1 F (36.7 C)  Resp: 20    Filed Vitals:   08/16/15 1106 08/16/15 1108 08/16/15 1110 08/16/15 1500  BP: 104/73 126/63  116/65  Pulse: 112 113 103 82  Temp:    98.1 F (36.7 C)  TempSrc:    Oral  Resp: 24 24 20    Height:      Weight:      SpO2: 96% 96% 96%     General: Pt is alert, follows commands appropriately, not in acute distress Cardiovascular: Regular rate and rhythm, S1/S2 +, no murmurs Respiratory: Clear to auscultation bilaterally, no wheezing, no crackles, no rhonchi Abdominal: Soft, non tender, non distended, bowel sounds +, no guarding Extremities: no edema, no cyanosis, pulses palpable bilaterally DP and PT Neuro: Grossly nonfocal  Discharge Instructions  Discharge Instructions    Call MD for:  difficulty breathing, headache or visual disturbances    Complete by:  As directed      Call MD for:  persistant dizziness or light-headedness    Complete by:  As directed      Call MD for:  persistant nausea and vomiting    Complete by:  As directed      Call MD for:  severe uncontrolled pain    Complete by:  As directed      Diet - low sodium heart healthy    Complete by:  As directed      Discharge instructions    Complete by:  As directed   Stress test is normal     Increase activity slowly    Complete by:  As directed             Medication List    TAKE these medications        albuterol 108 (90 Base) MCG/ACT inhaler  Commonly known as:  PROVENTIL HFA;VENTOLIN HFA  Inhale 2 puffs into the lungs daily as needed for wheezing or shortness of breath (shortness of breath). Reported on 06/18/2015     aspirin EC 81 MG  tablet  Take 1 tablet (81 mg total) by mouth daily.     cyclobenzaprine 10 MG tablet  Commonly known as:  FLEXERIL  Take 1 tablet (10 mg total) by mouth 3 (three) times daily as needed for muscle spasms.     HYDROcodone-acetaminophen 5-325 MG tablet  Commonly known as:  NORCO/VICODIN  Take 1 tablet by mouth 2 (two) times daily as needed for moderate pain (pain).     metoprolol tartrate 25 MG tablet  Commonly known as:  LOPRESSOR  Take 1 tablet (25 mg total) by mouth 2 (two) times daily.           Follow-up Information    Follow up with [name], MD. Schedule an appointment as soon as possible for a visit in 1 week.   Specialty:  Internal Medicine   Why:  Follow up appt after recent hospitalization   Contact information:   [place] [phone]        The results of significant diagnostics from this hospitalization (including imaging, microbiology, ancillary and laboratory) are listed below for reference.    Significant Diagnostic Studies: Dg Chest 2 View  08/16/2015  CLINICAL DATA:  Chest pain with left arm pain and shortness of breath tonight. EXAM: CHEST  2 VIEW COMPARISON:  03/22/2015 FINDINGS: Normal heart size and pulmonary vascularity. Emphysematous changes in the lungs with fine interstitial pattern likely representing fibrosis. No focal consolidation or airspace disease. No blunting of costophrenic angles. No pneumothorax. Mediastinal contours appear intact. Degenerative changes in the spine and shoulders. IMPRESSION: Emphysematous changes and fibrosis in the lungs. No evidence of active pulmonary disease. Electronically Signed   By: Lucienne Capers M.D.   On: 08/16/2015 01:21   Nm Myocar Multi W/spect W/wall Motion / Ef  08/16/2015   There was no ST segment deviation noted during stress.  No T wave inversion was noted during stress.  The study is normal.  This is a low risk study.  The left ventricular ejection fraction  is normal (55-65%).     Microbiology: No results found for this or any previous visit (from the past 240 hour(s)).   Labs: Basic Metabolic Panel:  Recent Labs Lab 08/16/15 0033 08/16/15 0310  NA 142  --   K 3.9  --   CL 111  --   CO2 24  --   GLUCOSE 124*  --   BUN 23*  --   CREATININE 0.88 0.84  CALCIUM 8.9  --    Liver Function Tests: No results for input(s): AST, ALT, ALKPHOS, BILITOT, PROT, ALBUMIN in the last 168 hours. No results for input(s): LIPASE, AMYLASE in the last 168 hours. No results for input(s): AMMONIA in the last 168  hours. CBC:  Recent Labs Lab 08/16/15 0033 08/16/15 0310  WBC 10.4 13.2*  NEUTROABS 6.8  --   HGB 12.9 12.9  HCT 40.8 40.2  MCV 93.2 92.6  PLT 199 219   Cardiac Enzymes:  Recent Labs Lab 08/16/15 0310 08/16/15 0555 08/16/15 0911  TROPONINI <0.03 <0.03 <0.03   BNP: BNP (last 3 results)  Recent Labs  08/16/15 0033  BNP 23.6    ProBNP (last 3 results) No results for input(s): PROBNP in the last 8760 hours.  CBG: No results for input(s): GLUCAP in the last 168 hours.

## 2015-08-17 ENCOUNTER — Telehealth: Payer: Self-pay

## 2015-08-17 NOTE — Telephone Encounter (Addendum)
Discharge Diagnoses:  Principal Problem:  Chest pain Active Problems:  Essential hypertension  Cigarette smoker  COPD GOLD II   Transition Care Management Follow-up Telephone Call   Date discharged? 08/15/2015   How have you been since you were released from the hospital? Feeling much better  Do you understand why you were in the hospital? Patient understands  Do you understand the discharge instructions? Patient understands  Where were you discharged to? Home  Items Reviewed:  Medications reviewed: Yes  Allergies reviewed: Yes  Dietary changes reviewed: Yes  Referrals reviewed: Yes  Functional Questionnaire:   Activities of Daily Living (ADLs):   States they are independent in the following: All ADL's States they require assistance with the following: N/a  Any transportation issues/concerns?: None  Any patient concerns? None at this time  Confirmed importance and date/time of follow-up visits scheduled: Appt scheduled and confirmed.   Provider Appointment booked with PCP for 09/10/2015. Pt would not see another provider here in the office.   Confirmed with patient if condition begins to worsen call PCP or go to the ER.  Patient was given the office number and encouraged to call back with question or concerns: Pt stated understanding.

## 2015-09-02 ENCOUNTER — Encounter: Payer: Self-pay | Admitting: Internal Medicine

## 2015-09-10 ENCOUNTER — Inpatient Hospital Stay: Payer: Medicare Other | Admitting: Internal Medicine

## 2015-09-10 DIAGNOSIS — Z0289 Encounter for other administrative examinations: Secondary | ICD-10-CM

## 2015-10-07 ENCOUNTER — Telehealth: Payer: Self-pay | Admitting: *Deleted

## 2015-10-07 NOTE — Telephone Encounter (Signed)
Dr Carlean Purl, This patient is scheduled for a PV 7-17 with a recall colon 7-27 Thursday for personal hx colon cancer and a [facility] as well.  She was seen in the ED 08-15-2015 with chest pain, all her labs including troponins were negative, stress test normal, EKG in NSR.  She has an EF of 55-65%, normal low risk study.  Last colon 09-2012 with you she had 1 HP polyp.  Cc in 2010. Are we ok to proceed with her colon 7-27?  She has a cardio Ov scheduled for 11-29-2015. thanks for your time. Lelan Pons PV

## 2015-10-07 NOTE — Telephone Encounter (Signed)
I think she is ok for direct [place]

## 2015-10-07 NOTE — Telephone Encounter (Signed)
[facility] Size     Small Medium Large Extra Extra Large    Erica [name]  10/07/2015  Telephone  MRN:  [id number]   Description: 65 year old female  Provider: Steva Ready, RN  Department: [place]       Reason for Call     Advice Only         Call Documentation      Steva Ready, RN at 10/07/2015 12:54 PM     Status: Signed       Expand All Collapse All   Dr Carlean Purl, This patient is scheduled for a PV 7-17 with a recall colon 7-27 Thursday for personal hx colon cancer and a [facility] as well. She was seen in the ED 08-15-2015 with chest pain, all her labs including troponins were negative, stress test normal, EKG in NSR. She has an EF of 55-65%, normal low risk study. Last colon 09-2012 with you she had 1 HP polyp. Cc in 2010. Are we ok to proceed with her colon 7-27? She has a cardio Ov scheduled for 11-29-2015. thanks for your time. Erica [name]               Encounter MyChart Messages     No messages in this encounter     Created by     Steva Ready, RN on 10/07/2015 12:54 PM     Visit Pharmacy     [address] - [place], [place] [address]     Contacts       Type Contact Phone    10/07/2015 12:54 PM Phone (Outgoing) [place], [place] (Self) [phone] (H)

## 2015-10-08 ENCOUNTER — Telehealth: Payer: Self-pay | Admitting: Emergency Medicine

## 2015-10-08 MED ORDER — HYDROCODONE-ACETAMINOPHEN 5-325 MG PO TABS: 1.0000 | ORAL_TABLET | Freq: Two times a day (BID) | ORAL | Status: DC | PRN

## 2015-10-08 NOTE — Telephone Encounter (Signed)
Patient called and stated she needed a refill on HYDROcodone-acetaminophen (NORCO/VICODIN) 5-325 MG tablet. She has a hospital fu appt 10/13/15. Please give the pt a call back when prescription is ready. Thanks.

## 2015-10-08 NOTE — Telephone Encounter (Signed)
Placed up front in cabinet, patient aware.

## 2015-10-08 NOTE — Telephone Encounter (Signed)
Printed and signed.  

## 2015-10-13 ENCOUNTER — Ambulatory Visit (INDEPENDENT_AMBULATORY_CARE_PROVIDER_SITE_OTHER): Payer: Medicare Other | Admitting: Internal Medicine

## 2015-10-13 ENCOUNTER — Encounter: Payer: Self-pay | Admitting: Internal Medicine

## 2015-10-13 VITALS — BP 132/78 | HR 71 | Temp 98.0°F | Resp 18 | Ht 62.5 in | Wt 158.0 lb

## 2015-10-13 DIAGNOSIS — Z720 Tobacco use: Secondary | ICD-10-CM | POA: Diagnosis not present

## 2015-10-13 DIAGNOSIS — R079 Chest pain, unspecified: Secondary | ICD-10-CM | POA: Diagnosis not present

## 2015-10-13 DIAGNOSIS — F17210 Nicotine dependence, cigarettes, uncomplicated: Secondary | ICD-10-CM

## 2015-10-13 DIAGNOSIS — K5080 Crohn's disease of both small and large intestine without complications: Secondary | ICD-10-CM

## 2015-10-13 MED ORDER — ALBUTEROL SULFATE HFA 108 (90 BASE) MCG/ACT IN AERS: 2.0000 | INHALATION_SPRAY | Freq: Every day | RESPIRATORY_TRACT | Status: DC | PRN

## 2015-10-13 NOTE — Patient Instructions (Signed)
I will send my note to Dr. Carlean Purl with my recommendation to do the upper endoscopy at the same time of the colonoscopy and see if he can do that.

## 2015-10-13 NOTE — Progress Notes (Signed)
Pre visit review using our clinic review tool, if applicable. No additional management support is needed unless otherwise documented below in the visit note. 

## 2015-10-13 NOTE — Progress Notes (Signed)
   Subjective:    Patient ID: Erica [name], female    DOB: Aug 17, 1950, 65 y.o.   MRN: [id number]  HPI The patient is a 65 YO female coming in for hospital follow up (in the hospital in May with severe chest pain, evaluated for MI without evidence, stress test negative, d-dimer and BNP negative). She has not had any more episodes which were that severe but she is getting some upper stomach and mid chest pain sometimes after eating. She does have untreated Crohn's disease and is due for colonoscopy in 2 weeks or so. She is not taking anything for acid reflux or heartburn. She is not sure if it is related. Several times per week but 4/10 not the 10/10 that led her to the ER.   Review of Systems  Constitutional: Negative for fever, chills, activity change, appetite change, fatigue and unexpected weight change.  HENT: Negative for congestion, postnasal drip, sinus pressure and sore throat.   Eyes: Negative.   Respiratory: Negative for cough, chest tightness, shortness of breath and wheezing.   Cardiovascular: Negative for chest pain, palpitations and leg swelling.  Gastrointestinal: Positive for abdominal pain and abdominal distention. Negative for nausea, vomiting, diarrhea and constipation.       Epigastric pain  Musculoskeletal: Positive for myalgias, back pain and arthralgias. Negative for gait problem.  Neurological: Negative.       Objective:   Physical Exam  Constitutional: She is oriented to person, place, and time. She appears well-developed and well-nourished.  HENT:  Head: Normocephalic and atraumatic.  Eyes: EOM are normal.  Neck: Normal range of motion.  Cardiovascular: Normal rate and regular rhythm.   Pulmonary/Chest: Effort normal and breath sounds normal.  Abdominal: Soft. Bowel sounds are normal. She exhibits no distension and no mass. There is tenderness. There is no rebound and no guarding.  Diffuse tenderness lower abdomen and some epigastric tenderness    Musculoskeletal: She exhibits no edema.  Neurological: She is alert and oriented to person, place, and time.  Skin: Skin is warm and dry.   Filed Vitals:   10/13/15 1026  BP: 132/78  Pulse: 71  Temp: 98 F (36.7 C)  TempSrc: Oral  Resp: 18  Height: 5' 2.5" (1.588 m)  Weight: 158 lb (71.668 kg)  SpO2: 95%      Assessment & Plan:

## 2015-10-13 NOTE — Assessment & Plan Note (Signed)
Reminded her that smoking is detrimental to her health and she should stop. She is not able to at this time.

## 2015-10-13 NOTE — Assessment & Plan Note (Signed)
She is still having episodes which could be esophageal erosion versus esophageal spasm. Will message her GI doctor to see if they can do EGD with her upcoming colonoscopy to evaluate.

## 2015-10-13 NOTE — Assessment & Plan Note (Signed)
Not taking any medicine and needs colonoscopy. Will ask her GI doctor if EGD can be added.

## 2015-10-18 ENCOUNTER — Ambulatory Visit (AMBULATORY_SURGERY_CENTER): Payer: Self-pay | Admitting: *Deleted

## 2015-10-18 VITALS — Ht 62.5 in | Wt 157.8 lb

## 2015-10-18 DIAGNOSIS — Z85038 Personal history of other malignant neoplasm of large intestine: Secondary | ICD-10-CM

## 2015-10-18 NOTE — Progress Notes (Signed)
No allergies to eggs or soy. No problems with anesthesia.  Pt not given Emmi instructions for colonoscopy; computer down  No oxygen use  No diet drug use

## 2015-10-18 NOTE — Telephone Encounter (Signed)
I reviewed the info re: chest pain admit and do not agree with that recommendation based upon what was noted. She could schedule an appt to review if desired.

## 2015-10-18 NOTE — Telephone Encounter (Signed)
Talked with pt; she will wait and talk with Dr Carlean Purl day of colonoscopy about need for Ov

## 2015-10-18 NOTE — Telephone Encounter (Signed)
Dr Carlean Purl: I saw Erica [name] in [facility] today.  She said that during her ER Visit  08/15/15 for chest pain it was suggested that she may need EGD to evaluate symptoms.  She would like to have both EGD and colonoscopy on same day if you think she should have EGD and schedule allows.  She is willing to have colonoscopy as scheduled and schedule EGD at later date if needed.  Please advise.  Thanks, Juliann Pulse

## 2015-10-19 ENCOUNTER — Telehealth: Payer: Self-pay

## 2015-10-19 DIAGNOSIS — R1013 Epigastric pain: Secondary | ICD-10-CM

## 2015-10-19 NOTE — Telephone Encounter (Signed)
Patient notified that we added on EGD.  She appreciated the call.

## 2015-10-19 NOTE — Telephone Encounter (Signed)
-----   Message from Gatha Mayer, MD sent at 10/18/2015  5:38 PM EDT ----- Regarding: egd w/ colonoscopy Got a message today re: possible egd w/ colonoscopy At that time did not see Dr. Nathanial Millman note about post-prandial epigastric pain  Originally I told Ulice Dash no EGD but w/ this info and her hx Crohn's I think that is ok to add on  Thanks

## 2015-10-28 ENCOUNTER — Encounter: Payer: Self-pay | Admitting: Internal Medicine

## 2015-10-28 ENCOUNTER — Ambulatory Visit (AMBULATORY_SURGERY_CENTER): Payer: Medicare Other | Admitting: Internal Medicine

## 2015-10-28 VITALS — BP 121/64 | HR 63 | Temp 98.2°F | Resp 13 | Ht 62.0 in | Wt 157.0 lb

## 2015-10-28 DIAGNOSIS — K2970 Gastritis, unspecified, without bleeding: Secondary | ICD-10-CM | POA: Diagnosis not present

## 2015-10-28 DIAGNOSIS — R1013 Epigastric pain: Secondary | ICD-10-CM

## 2015-10-28 DIAGNOSIS — K2950 Unspecified chronic gastritis without bleeding: Secondary | ICD-10-CM | POA: Diagnosis not present

## 2015-10-28 DIAGNOSIS — K621 Rectal polyp: Secondary | ICD-10-CM | POA: Diagnosis not present

## 2015-10-28 DIAGNOSIS — K50018 Crohn's disease of small intestine with other complication: Secondary | ICD-10-CM | POA: Diagnosis not present

## 2015-10-28 DIAGNOSIS — I10 Essential (primary) hypertension: Secondary | ICD-10-CM | POA: Diagnosis not present

## 2015-10-28 DIAGNOSIS — E669 Obesity, unspecified: Secondary | ICD-10-CM | POA: Diagnosis not present

## 2015-10-28 DIAGNOSIS — J449 Chronic obstructive pulmonary disease, unspecified: Secondary | ICD-10-CM | POA: Diagnosis not present

## 2015-10-28 DIAGNOSIS — D128 Benign neoplasm of rectum: Secondary | ICD-10-CM

## 2015-10-28 DIAGNOSIS — I2510 Atherosclerotic heart disease of native coronary artery without angina pectoris: Secondary | ICD-10-CM | POA: Diagnosis not present

## 2015-10-28 DIAGNOSIS — K529 Noninfective gastroenteritis and colitis, unspecified: Secondary | ICD-10-CM | POA: Diagnosis not present

## 2015-10-28 DIAGNOSIS — Z86010 Personal history of colonic polyps: Secondary | ICD-10-CM

## 2015-10-28 DIAGNOSIS — Z85038 Personal history of other malignant neoplasm of large intestine: Secondary | ICD-10-CM | POA: Diagnosis not present

## 2015-10-28 MED ORDER — SODIUM CHLORIDE 0.9 % IV SOLN: INTRAVENOUS | Status: DC

## 2015-10-28 NOTE — Op Note (Signed)
Erica [name] Patient Name: Erica [name] Procedure Date: 10/28/2015 9:32 AM MRN: [id number] Endoscopist: Gatha Mayer , MD Age: 65 Referring MD:  Date of Birth: 03/28/1951 Gender: Female Account #: [id number] Procedure:                Colonoscopy Indications:              High risk colon cancer surveillance: Personal                            history of colon cancer Medicines:                Propofol per Anesthesia, Monitored Anesthesia Care Procedure:                Pre-Anesthesia Assessment:                           - Prior to the procedure, a History and Physical                            was performed, and patient medications and                            allergies were reviewed. The patient's tolerance of                            previous anesthesia was also reviewed. The risks                            and benefits of the procedure and the sedation                            options and risks were discussed with the patient.                            All questions were answered, and informed consent                            was obtained. Prior Anticoagulants: The patient has                            taken no previous anticoagulant or antiplatelet                            agents. ASA Grade Assessment: II - A patient with                            mild systemic disease. After reviewing the risks                            and benefits, the patient was deemed in                            satisfactory condition to undergo the procedure.                           -  Prior to the procedure, a History and Physical                            was performed, and patient medications and                            allergies were reviewed. The patient's tolerance of                            previous anesthesia was also reviewed. The risks                            and benefits of the procedure and the sedation                            options and risks were  discussed with the patient.                            All questions were answered, and informed consent                            was obtained. Prior Anticoagulants: The patient has                            taken no previous anticoagulant or antiplatelet                            agents. ASA Grade Assessment: II - A patient with                            mild systemic disease. After reviewing the risks                            and benefits, the patient was deemed in                            satisfactory condition to undergo the procedure.                           After obtaining informed consent, the colonoscope                            was passed under direct vision. Throughout the                            procedure, the patient's blood pressure, pulse, and                            oxygen saturations were monitored continuously. The                            Model PCF-H190DL [id number]) scope was introduced  through the anus and advanced to the the                            ileocolonic anastomosis. The colonoscopy was                            performed without difficulty. The patient tolerated                            the procedure well. The quality of the bowel                            preparation was good. The bowel preparation used                            was Miralax. The terminal ileum and the rectum were                            photographed. Scope In: 9:33:21 AM Scope Out: 9:44:26 AM Scope Withdrawal Time: 0 hours 7 minutes 33 seconds  Total Procedure Duration: 0 hours 11 minutes 5 seconds  Findings:                 The perianal and digital rectal examinations were                            normal.                           Diffuse inflammation, moderate in severity and                            characterized by altered vascularity, erythema and                            granularity was found in the neo-terminal Ileum.                             Biopsies were taken with a cold forceps for                            histology. Verification of patient identification                            for the specimen was done. Estimated blood loss was                            minimal.                           There was evidence of a prior end-to-side                            ileo-colonic anastomosis in the transverse colon.  This was patent and was characterized by                            inflammation. The anastomosis was traversed.                           Two sessile polyps were found in the rectum. The                            polyps were 2 mm in size. These polyps were removed                            with a cold biopsy forceps. Resection and retrieval                            were complete. Verification of patient                            identification for the specimen was done. Estimated                            blood loss was minimal.                           The exam was otherwise without abnormality on                            direct and retroflexion views. Complications:            No immediate complications. Estimated Blood Loss:     Estimated blood loss was minimal. Impression:               - Ileitis, consistent with Crohn's disease.                            Biopsied.                           - Patent end-to-side ileo-colonic anastomosis,                            characterized by inflammation.                           - Two 2 mm polyps in the rectum, removed with a                            cold biopsy forceps. Resected and retrieved.                           - The examination was otherwise normal on direct                            and retroflexion views.  Personal history of colon cancer Recommendation:           - Patient has a contact number available for                            emergencies. The signs and symptoms  of potential                            delayed complications were discussed with the                            patient. Return to normal activities tomorrow.                            Written discharge instructions were provided to the                            patient.                           - Resume previous diet.                           - Continue present medications.                           - Repeat colonoscopy is recommended for                            surveillance. The colonoscopy date will be                            determined after pathology results from today's                            exam become available for review. Gatha Mayer, MD 10/28/2015 10:01:03 AM This report has been signed electronically.

## 2015-10-28 NOTE — Op Note (Signed)
[place] Patient Name: Erica [name] Procedure Date: 10/28/2015 9:19 AM MRN: [id number] Endoscopist: Gatha Mayer , MD Age: 65 Referring MD:  Date of Birth: 28-Jul-1950 Gender: Female Account #: [id number] Procedure:                Upper GI endoscopy Indications:              Epigastric abdominal pain, Unexplained chest pain Medicines:                Propofol per Anesthesia, Monitored Anesthesia Care Procedure:                Pre-Anesthesia Assessment:                           - Prior to the procedure, a History and Physical                            was performed, and patient medications and                            allergies were reviewed. The patient's tolerance of                            previous anesthesia was also reviewed. The risks                            and benefits of the procedure and the sedation                            options and risks were discussed with the patient.                            All questions were answered, and informed consent                            was obtained. Prior Anticoagulants: The patient has                            taken no previous anticoagulant or antiplatelet                            agents. ASA Grade Assessment: II - A patient with                            mild systemic disease. After reviewing the risks                            and benefits, the patient was deemed in                            satisfactory condition to undergo the procedure.                           After obtaining informed consent, the endoscope was  passed under direct vision. Throughout the                            procedure, the patient's blood pressure, pulse, and                            oxygen saturations were monitored continuously. The                            Model GIF-[id number]) scope was introduced                            through the mouth, and advanced to the second part                           of duodenum. The upper GI endoscopy was                            accomplished without difficulty. The patient                            tolerated the procedure well. Scope In: Scope Out: Findings:                 Diffuse moderate inflammation characterized by                            congestion (edema), erosions, erythema and                            granularity was found in the gastric antrum.                            Biopsies were taken with a cold forceps for                            histology. Verification of patient identification                            for the specimen was done. Estimated blood loss was                            minimal.                           There was a small lipoma in the second portion of                            the duodenum.                           The exam was otherwise without abnormality.                           The cardia and gastric fundus were normal on  retroflexion. Complications:            No immediate complications. Estimated Blood Loss:     Estimated blood loss was minimal. Estimated blood                            loss was minimal. Impression:               - Gastritis. Biopsied.                           - Duodenal lipoma.                           - The examination was otherwise normal. Recommendation:           - Patient has a contact number available for                            emergencies. The signs and symptoms of potential                            delayed complications were discussed with the                            patient. Return to normal activities tomorrow.                            Written discharge instructions were provided to the                            patient.                           - Resume previous diet.                           - Continue present medications.                           - See the other procedure note for documentation  of                            additional recommendations. Gatha Mayer, MD 10/28/2015 9:55:01 AM This report has been signed electronically.

## 2015-10-28 NOTE — Progress Notes (Signed)
Called to room to assist during endoscopic procedure.  Patient ID and intended procedure confirmed with present staff. Received instructions for my participation in the procedure from the performing physician.  

## 2015-10-28 NOTE — Patient Instructions (Addendum)
The stomach looks irritated - called gastritis. Probably not Crohn's but I took biopsies and will let you know. Two tiny polyps removed. Crohn's seen again in small intestine - has been worse in past.  Your next routine colonoscopy should be in 5 years - 2022.  I appreciate the opportunity to care for you. Gatha Mayer, MD, [facility]   Discharge instructions given. Biopsies taken. Handout on polyps. Resume previous medications. YOU HAD AN ENDOSCOPIC PROCEDURE TODAY AT [facility]:   Refer to the procedure report that was given to you for any specific questions about what was found during the examination.  If the procedure report does not answer your questions, please call your gastroenterologist to clarify.  If you requested that your care partner not be given the details of your procedure findings, then the procedure report has been included in a sealed envelope for you to review at your convenience later.  YOU SHOULD EXPECT: Some feelings of bloating in the abdomen. Passage of more gas than usual.  Walking can help get rid of the air that was put into your GI tract during the procedure and reduce the bloating. If you had a lower endoscopy (such as a colonoscopy or flexible sigmoidoscopy) you may notice spotting of blood in your stool or on the toilet paper. If you underwent a bowel prep for your procedure, you may not have a normal bowel movement for a few days.  Please Note:  You might notice some irritation and congestion in your nose or some drainage.  This is from the oxygen used during your procedure.  There is no need for concern and it should clear up in a day or so.  SYMPTOMS TO REPORT IMMEDIATELY:   Following lower endoscopy (colonoscopy or flexible sigmoidoscopy):  Excessive amounts of blood in the stool  Significant tenderness or worsening of abdominal pains  Swelling of the abdomen that is new, acute  Fever of 100F or higher   Following upper  endoscopy (EGD)  Vomiting of blood or coffee ground material  New chest pain or pain under the shoulder blades  Painful or persistently difficult swallowing  New shortness of breath  Fever of 100F or higher  Black, tarry-looking stools  For urgent or emergent issues, a gastroenterologist can be reached at any hour by calling [phone].   DIET: Your first meal following the procedure should be a small meal and then it is ok to progress to your normal diet. Heavy or fried foods are harder to digest and may make you feel nauseous or bloated.  Likewise, meals heavy in dairy and vegetables can increase bloating.  Drink plenty of fluids but you should avoid alcoholic beverages for 24 hours.  ACTIVITY:  You should plan to take it easy for the rest of today and you should NOT DRIVE or use heavy machinery until tomorrow (because of the sedation medicines used during the test).    FOLLOW UP: Our staff will call the number listed on your records the next business day following your procedure to check on you and address any questions or concerns that you may have regarding the information given to you following your procedure. If we do not reach you, we will leave a message.  However, if you are feeling well and you are not experiencing any problems, there is no need to return our call.  We will assume that you have returned to your regular daily activities without incident.  If any biopsies  were taken you will be contacted by phone or by letter within the next 1-3 weeks.  Please call us at [phone] if you have not heard about the biopsies in 3 weeks.    SIGNATURES/CONFIDENTIALITY: You and/or your care partner have signed paperwork which will be entered into your electronic medical record.  These signatures attest to the fact that that the information above on your After Visit Summary has been reviewed and is understood.  Full responsibility of the confidentiality of this discharge information  lies with you and/or your care-partner.

## 2015-10-28 NOTE — Progress Notes (Signed)
Report to PACU, RN, vss, BBS= Clear.  

## 2015-10-29 ENCOUNTER — Telehealth: Payer: Self-pay | Admitting: *Deleted

## 2015-10-29 NOTE — Telephone Encounter (Signed)
No answer, left message to call if questions or concerns. 

## 2015-11-03 ENCOUNTER — Telehealth: Payer: Self-pay | Admitting: Internal Medicine

## 2015-11-03 MED ORDER — ALBUTEROL SULFATE HFA 108 (90 BASE) MCG/ACT IN AERS: 2.0000 | INHALATION_SPRAY | Freq: Every day | RESPIRATORY_TRACT | 6 refills | Status: DC | PRN

## 2015-11-03 NOTE — Telephone Encounter (Signed)
Spoke with pt, requesting Proair be refilled to her pharmacy.  Pt currently has Ventolin and she states this does not manage her symptoms as well as Proair.   Rx specifically requesting proair has been sent to preferred pharmacy.  Nothing further needed.

## 2015-11-05 ENCOUNTER — Encounter: Payer: Self-pay | Admitting: Internal Medicine

## 2015-11-05 NOTE — Progress Notes (Signed)
Hyperplastic colon polyps Active ileitis Mild stomach inflammation 3 year colon recall re: hx colon cancer and Crohn's

## 2015-11-08 ENCOUNTER — Telehealth: Payer: Self-pay

## 2015-11-08 NOTE — Telephone Encounter (Signed)
Patient called and said she got a no show fee for 6/9. She states she called on 6/7 to cancel the app. And states at that same time she made her app for 7/12. The computer shows on 7/7 she made the app for 7/12. I ran a report and in the last 2 years this is the 1st time I see a no show. Please follow up, Thank you.

## 2015-11-11 DIAGNOSIS — S90862A Insect bite (nonvenomous), left foot, initial encounter: Secondary | ICD-10-CM | POA: Diagnosis not present

## 2015-11-11 DIAGNOSIS — S90861A Insect bite (nonvenomous), right foot, initial encounter: Secondary | ICD-10-CM | POA: Diagnosis not present

## 2015-11-11 DIAGNOSIS — L03119 Cellulitis of unspecified part of limb: Secondary | ICD-10-CM | POA: Diagnosis not present

## 2015-11-15 ENCOUNTER — Encounter: Payer: Self-pay | Admitting: Interventional Cardiology

## 2015-11-17 NOTE — Telephone Encounter (Signed)
email sent to billing to remove   °

## 2015-11-29 ENCOUNTER — Encounter: Payer: Self-pay | Admitting: Interventional Cardiology

## 2015-11-29 ENCOUNTER — Ambulatory Visit (INDEPENDENT_AMBULATORY_CARE_PROVIDER_SITE_OTHER): Payer: Medicare Other | Admitting: Interventional Cardiology

## 2015-11-29 ENCOUNTER — Encounter (INDEPENDENT_AMBULATORY_CARE_PROVIDER_SITE_OTHER): Payer: Self-pay

## 2015-11-29 VITALS — BP 132/84 | HR 55 | Ht 62.5 in | Wt 158.8 lb

## 2015-11-29 DIAGNOSIS — Z720 Tobacco use: Secondary | ICD-10-CM | POA: Diagnosis not present

## 2015-11-29 DIAGNOSIS — I5032 Chronic diastolic (congestive) heart failure: Secondary | ICD-10-CM

## 2015-11-29 DIAGNOSIS — I209 Angina pectoris, unspecified: Secondary | ICD-10-CM | POA: Diagnosis not present

## 2015-11-29 DIAGNOSIS — I5181 Takotsubo syndrome: Secondary | ICD-10-CM

## 2015-11-29 DIAGNOSIS — I10 Essential (primary) hypertension: Secondary | ICD-10-CM

## 2015-11-29 DIAGNOSIS — F17210 Nicotine dependence, cigarettes, uncomplicated: Secondary | ICD-10-CM

## 2015-11-29 DIAGNOSIS — E785 Hyperlipidemia, unspecified: Secondary | ICD-10-CM | POA: Diagnosis not present

## 2015-11-29 NOTE — Patient Instructions (Signed)
Medication Instructions:  Your physician recommends that you continue on your current medications as directed. Please refer to the Current Medication list given to you today.   Labwork: None ordered  Testing/Procedures: None ordered  Follow-Up: Your physician wants you to follow-up in: 1 year with Dr.Smith You will receive a reminder letter in the mail two months in advance. If you don't receive a letter, please call our office to schedule the follow-up appointment.   Any Other Special Instructions Will Be Listed Below (If Applicable).     If you need a refill on your cardiac medications before your next appointment, please call your pharmacy.

## 2015-11-29 NOTE — Progress Notes (Signed)
Cardiology Office Note    Date:  11/29/2015   ID:  Erica, [name] [birth year], MRN [id number]  PCP:  [name], MD  Cardiologist: Sinclair Grooms, MD   Chief Complaint  Patient presents with  . Chest Pain    History of Present Illness:  Erica [name] is a 65 y.o. female who presents for History of stress-induced cardiomyopathy 2010, heavy tobacco use , hyperlipidemia, hypertension, an history of chronic diastolic heart failure.  The patient is doing well. She had hospitalization for chest pain in May 2017 with negative cardiac markers. A myocardial perfusion study was negative for evidence of ischemia/infarction. Overall, she is doing well.    Past Medical History:  Diagnosis Date  . ADENOCARCINOMA, COLON, HX OF 03/2009  . ANEMIA   . BACK PAIN, LUMBAR   . Cancer [facility]) 2010   colon cancer  . CHF (congestive heart failure) ([place])   . COPD (chronic obstructive pulmonary disease) with emphysema ([place])   . Crohn's  02/2010   ileal ulcers, intol of entercort--refuses treatment  . GERD   . History of transfusion of whole blood   . HYPERLIPIDEMIA   . HYPERTENSION   . Microscopic hematuria    chronic  . Obstruction of intestine or colon (H[facility])    adhesions  . OSTEOARTHRITIS   . Rectal fissure    Possible fissure  . Rotator cuff tear   . Takotsubo syndrome 12/2008  . URINARY INCONTINENCE     Past Surgical History:  Procedure Laterality Date  . ABDOMINAL HYSTERECTOMY  1994  . APPENDECTOMY  1964  . BREAST SURGERY    . CESAREAN SECTION     x 3  . CHOLECYSTECTOMY  1995  . COLON SURGERY  03/2009   hemicolectomy colon cancer  . COLONOSCOPY W/ BIOPSIES AND POLYPECTOMY  01/24/2011   (Crohn's)ileitis, internal hemorrhoids  . HAND SURGERY  1991  . KNEE SURGERY Right 1981  . right knee replacement  03/2010  . TONSILLECTOMY  1952  . UPPER GASTROINTESTINAL ENDOSCOPY  05/16/2010   normal  . VIDEO BRONCHOSCOPY Bilateral 12/18/2014   Procedure: VIDEO  BRONCHOSCOPY WITHOUT FLUORO;  Surgeon: Tanda Rockers, MD;  Location: [facility];  Service: Cardiopulmonary;  Laterality: Bilateral;    Current Medications: Outpatient Medications Prior to Visit  Medication Sig Dispense Refill  . albuterol (PROVENTIL HFA;VENTOLIN HFA) 108 (90 Base) MCG/ACT inhaler Inhale 2 puffs into the lungs daily as needed for wheezing or shortness of breath (shortness of breath). Reported on 06/18/2015 1 Inhaler 6  . aspirin EC 81 MG tablet Take 1 tablet (81 mg total) by mouth daily. (Patient not taking: Reported on 10/18/2015)    . cyclobenzaprine (FLEXERIL) 10 MG tablet Take 1 tablet (10 mg total) by mouth 3 (three) times daily as needed for muscle spasms. (Patient not taking: Reported on 10/28/2015) 60 tablet 0  . HYDROcodone-acetaminophen (NORCO/VICODIN) 5-325 MG tablet Take 1 tablet by mouth 2 (two) times daily as needed for moderate pain (pain). 60 tablet 0  . metoprolol tartrate (LOPRESSOR) 25 MG tablet Take 1 tablet (25 mg total) by mouth 2 (two) times daily. 180 tablet 3   Facility-Administered Medications Prior to Visit  Medication Dose Route Frequency Provider Last Rate Last Dose  . 0.9 %  sodium chloride infusion   Intravenous Continuous Gatha Mayer, MD         Allergies:   Morphine   Social History   Social History  . Marital status: Married  Spouse name: N/A  . Number of children: N/A  . Years of education: N/A   Occupational History  . Retired  Unemployed   Social History Main Topics  . Smoking status: Current Every Day Smoker    Packs/day: 0.50    Years: 48.00    Types: Cigarettes  . Smokeless tobacco: Never Used  . Alcohol use No  . Drug use: No  . Sexual activity: Not on file   Other Topics Concern  . Not on file   Social History Narrative   Lives with husband and 2 grand dtr; 45 and 52        Family History:  The patient's family history includes Arthritis in her other; Colon cancer (age of onset: 35) in her sister; Colon  cancer (age of onset: 60) in her father; Heart disease in her father; Hypertension in her father; Kidney disease in her father; Throat cancer in her mother.   ROS:   Please see the history of present illness.    Joint swelling, back pain, wheezing, otherwise unremarkable.  All other systems reviewed and are negative.   PHYSICAL EXAM:   VS:  There were no vitals taken for this visit.   GEN: Well nourished, well developed, in no acute distress  HEENT: normal  Neck: no JVD, carotid bruits, or masses Cardiac: RRR; no murmurs, rubs, or gallops,no edema  Respiratory:  clear to auscultation bilaterally, normal work of breathing GI: soft, nontender, nondistended, + BS MS: no deformity or atrophy  Skin: warm and dry, no rash Neuro:  Alert and Oriented x 3, Strength and sensation are intact Psych: euthymic mood, full affect  Wt Readings from Last 3 Encounters:  10/28/15 157 lb (71.2 kg)  10/18/15 157 lb 12.8 oz (71.6 kg)  10/13/15 158 lb (71.7 kg)      Studies/Labs Reviewed:   EKG:  EKG  Is normal in May.  Recent Labs: 08/16/2015: B Natriuretic Peptide 23.6; BUN 23; Creatinine, Ser 0.84; Hemoglobin 12.9; Platelets 219; Potassium 3.9; Sodium 142   Lipid Panel    Component Value Date/Time   CHOL 175 04/27/2015 0908   TRIG 123 04/27/2015 0908   HDL 43 (L) 04/27/2015 0908   CHOLHDL 4.1 04/27/2015 0908   VLDL 25 04/27/2015 0908   LDLCALC 107 04/27/2015 0908   LDLDIRECT 138.5 09/30/2012 1518    Additional studies/ records that were reviewed today include:  08/16/2015 myocardial perfusion imaging: Study Result    There was no ST segment deviation noted during stress.  No T wave inversion was noted during stress.  The study is normal.  This is a low risk study.  The left ventricular ejection fraction is normal (55-65%).        ASSESSMENT:    1. Essential hypertension   2. Hyperlipidemia   3. Ischemic chest pain ([place])   4. Cigarette smoker   5. Takotsubo syndrome        PLAN:  In order of problems listed above:  1. Low salt diet. Continue low-dose metoprolol. 2. When check 6 months, LDL was 100 which is sufficient for a person with no evidence of coronary disease. 3. Negative ischemic workup in May 2017. Continue to follow clinically. 4. Continues to smoke intermittently. Cautioned against redeveloping the habit.    Medication Adjustments/Labs and Tests Ordered: Current medicines are reviewed at length with the patient today.  Concerns regarding medicines are outlined above.  Medication changes, Labs and Tests ordered today are listed in the Patient Instructions below. There  are no Patient Instructions on file for this visit.   Signed, Sinclair Grooms, MD  11/29/2015 8:12 AM    [facility] [place], [place], [place]  [address] Phone: [phone]; Fax: [phone]

## 2015-12-02 ENCOUNTER — Telehealth: Payer: Self-pay | Admitting: *Deleted

## 2015-12-02 MED ORDER — HYDROCODONE-ACETAMINOPHEN 5-325 MG PO TABS: 1.0000 | ORAL_TABLET | Freq: Two times a day (BID) | ORAL | 0 refills | Status: DC | PRN

## 2015-12-02 NOTE — Telephone Encounter (Signed)
Called pt no answer [facility] rx ready for pick-up.../lmb 

## 2015-12-02 NOTE — Telephone Encounter (Signed)
Rec'd call pt requesting refill on her Hydrocodone.../lmb 

## 2015-12-02 NOTE — Telephone Encounter (Signed)
Printed and signed.  

## 2016-01-27 ENCOUNTER — Telehealth: Payer: Self-pay | Admitting: *Deleted

## 2016-01-27 MED ORDER — HYDROCODONE-ACETAMINOPHEN 5-325 MG PO TABS: 1.0000 | ORAL_TABLET | Freq: Two times a day (BID) | ORAL | 0 refills | Status: DC | PRN

## 2016-01-27 NOTE — Telephone Encounter (Signed)
Rec'd call pt requesting refill on her Hydrocodone.../lmb 

## 2016-01-27 NOTE — Telephone Encounter (Signed)
Notified pt rx ready for pick-up.Marland KitchenChryl Heck

## 2016-03-21 ENCOUNTER — Telehealth: Payer: Self-pay | Admitting: *Deleted

## 2016-03-21 MED ORDER — HYDROCODONE-ACETAMINOPHEN 5-325 MG PO TABS: 1.0000 | ORAL_TABLET | Freq: Two times a day (BID) | ORAL | 0 refills | Status: DC | PRN

## 2016-03-21 NOTE — Telephone Encounter (Signed)
Printed and signed.  

## 2016-03-21 NOTE — Telephone Encounter (Signed)
Rec'd call pt requesting refill on her Hydrocodone.../lmb 

## 2016-03-22 NOTE — Telephone Encounter (Signed)
Called pt no answer [facility] rx ready for pick-up.../lmb 

## 2016-04-24 ENCOUNTER — Telehealth: Payer: Self-pay | Admitting: *Deleted

## 2016-04-24 NOTE — Telephone Encounter (Signed)
Rec'd call pt requesting refill on her Hydrocodone.../lmb 

## 2016-04-25 MED ORDER — HYDROCODONE-ACETAMINOPHEN 5-325 MG PO TABS: 1.0000 | ORAL_TABLET | Freq: Two times a day (BID) | ORAL | 0 refills | Status: DC | PRN

## 2016-04-25 NOTE — Telephone Encounter (Signed)
Notified pt rx ready for pick-up.../lmb 

## 2016-04-25 NOTE — Telephone Encounter (Signed)
Refilled but needs visit for more refills. Needs UDS

## 2016-04-26 ENCOUNTER — Encounter: Payer: Self-pay | Admitting: Internal Medicine

## 2016-04-26 DIAGNOSIS — Z79891 Long term (current) use of opiate analgesic: Secondary | ICD-10-CM | POA: Diagnosis not present

## 2016-05-05 ENCOUNTER — Other Ambulatory Visit (INDEPENDENT_AMBULATORY_CARE_PROVIDER_SITE_OTHER): Payer: Medicare Other

## 2016-05-05 ENCOUNTER — Encounter: Payer: Self-pay | Admitting: Internal Medicine

## 2016-05-05 ENCOUNTER — Ambulatory Visit (INDEPENDENT_AMBULATORY_CARE_PROVIDER_SITE_OTHER): Payer: Medicare Other | Admitting: Internal Medicine

## 2016-05-05 VITALS — BP 120/78 | HR 67 | Temp 98.5°F | Ht 62.5 in | Wt 135.0 lb

## 2016-05-05 DIAGNOSIS — M150 Primary generalized (osteo)arthritis: Secondary | ICD-10-CM

## 2016-05-05 DIAGNOSIS — E538 Deficiency of other specified B group vitamins: Secondary | ICD-10-CM | POA: Diagnosis not present

## 2016-05-05 DIAGNOSIS — R5383 Other fatigue: Secondary | ICD-10-CM | POA: Diagnosis not present

## 2016-05-05 DIAGNOSIS — E611 Iron deficiency: Secondary | ICD-10-CM

## 2016-05-05 DIAGNOSIS — R634 Abnormal weight loss: Secondary | ICD-10-CM

## 2016-05-05 DIAGNOSIS — M159 Polyosteoarthritis, unspecified: Secondary | ICD-10-CM

## 2016-05-05 DIAGNOSIS — M8949 Other hypertrophic osteoarthropathy, multiple sites: Secondary | ICD-10-CM

## 2016-05-05 LAB — CBC
HCT: 39.8 % (ref 36.0–46.0)
Hemoglobin: 13.2 g/dL (ref 12.0–15.0)
MCHC: 33.3 g/dL (ref 30.0–36.0)
MCV: 91 fl (ref 78.0–100.0)
Platelets: 245 10*3/uL (ref 150.0–400.0)
RBC: 4.37 Mil/uL (ref 3.87–5.11)
RDW: 13.7 % (ref 11.5–15.5)
WBC: 7.9 10*3/uL (ref 4.0–10.5)

## 2016-05-05 LAB — COMPREHENSIVE METABOLIC PANEL
ALT: 11 U/L (ref 0–35)
AST: 12 U/L (ref 0–37)
Albumin: 4.1 g/dL (ref 3.5–5.2)
Alkaline Phosphatase: 61 U/L (ref 39–117)
BUN: 18 mg/dL (ref 6–23)
CO2: 29 mEq/L (ref 19–32)
Calcium: 9.9 mg/dL (ref 8.4–10.5)
Chloride: 109 mEq/L (ref 96–112)
Creatinine, Ser: 0.82 mg/dL (ref 0.40–1.20)
GFR: 74.1 mL/min (ref >60.00)
Glucose, Bld: 99 mg/dL (ref 70–99)
Potassium: 5 mEq/L (ref 3.5–5.1)
Sodium: 140 mEq/L (ref 135–145)
Total Bilirubin: 0.4 mg/dL (ref 0.2–1.2)
Total Protein: 6.7 g/dL (ref 6.0–8.3)

## 2016-05-05 LAB — FERRITIN: Ferritin: 62.8 ng/mL (ref 10.0–291.0)

## 2016-05-05 LAB — TSH: TSH: 0.6 u[IU]/mL (ref 0.35–4.50)

## 2016-05-05 LAB — VITAMIN B12: Vitamin B-12: 293 pg/mL (ref 211–911)

## 2016-05-05 MED ORDER — CYCLOBENZAPRINE HCL 10 MG PO TABS: 10.0000 mg | ORAL_TABLET | Freq: Three times a day (TID) | ORAL | 0 refills | Status: DC | PRN

## 2016-05-05 NOTE — Patient Instructions (Addendum)
We will check the labs today for a cause of the weight loss.

## 2016-05-05 NOTE — Progress Notes (Signed)
   Subjective:    Patient ID: Erica [name], female    DOB: [birth year], 66 y.o.   MRN: [id number]  HPI The patient is a 66 YO female coming in for weight loss (30 pounds in the last 6 months). She has been taking care of her husband full time. She has not been eating well and admits to barely snacking during the day. She does have colitis issues and sometimes avoids eating certain foods due to worry about getting diarrhea. She is still smoking but denies worsening cough or coughing up blood or dark sputum. She is having increased joint aches and pains which limit her activities. She thinks that she injured her right shoulder while moving her husband. She has family in town who offer to help but she does not take their help as she does not want her children to have to deal with caring for her husband.   Review of Systems  Constitutional: Positive for activity change, appetite change, fatigue and unexpected weight change. Negative for chills.  HENT: Negative.   Eyes: Negative.   Respiratory: Positive for cough. Negative for chest tightness, shortness of breath and wheezing.   Cardiovascular: Negative.   Gastrointestinal: Negative.   Musculoskeletal: Positive for arthralgias, back pain, joint swelling, myalgias and neck pain. Negative for gait problem.  Skin: Negative.   Neurological: Negative.   Psychiatric/Behavioral: Positive for decreased concentration and dysphoric mood.      Objective:   Physical Exam  Constitutional: She is oriented to person, place, and time. She appears well-developed.  Appears thin  HENT:  Head: Normocephalic and atraumatic.  Eyes: EOM are normal.  Neck: Normal range of motion.  Cardiovascular: Normal rate and regular rhythm.   Pulmonary/Chest: Effort normal and breath sounds normal. No respiratory distress. She has no wheezes.  Abdominal: Soft. Bowel sounds are normal. She exhibits no distension and no mass. There is no tenderness. There is no rebound and no  guarding.  Musculoskeletal: She exhibits tenderness.  Diffuse including right shoulder and neck, lumbar  Neurological: She is alert and oriented to person, place, and time.  Skin: Skin is warm and dry.  Psychiatric:  Distraught during visit and hard to re-direct from her husband's health state   Vitals:   05/05/16 0919  BP: 120/78  Pulse: 67  Temp: 98.5 F (36.9 C)  TempSrc: Oral  SpO2: 98%  Weight: 135 lb (61.2 kg)  Height: 5' 2.5" (1.588 m)      Assessment & Plan:  Visit time 25 minutes, greater than 50% of that time was spent in face to face with the patient counseling and coordination of care: counseling about the importance of her own well being so that she can be a good caregiver and the need to accept some help from others due to the detrimental impact onto her own health recently.

## 2016-05-05 NOTE — Progress Notes (Signed)
Pre visit review using our clinic review tool, if applicable. No additional management support is needed unless otherwise documented below in the visit note. 

## 2016-05-05 NOTE — Assessment & Plan Note (Addendum)
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 Renewed her flexeril today.

## 2016-05-05 NOTE — Assessment & Plan Note (Signed)
Unexpected weight loss of 30 pounds since the last visit about 6 months ago. 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 Given that she is a smoker this could also represent malignancy. She is not up to date on mammogram but declines at this time. She is up to date on colonoscopy (past hx colon cancer). Checking labs today for clues about etiology. She is asked to increase her calories and return for visit in 1 month to assess weight.

## 2016-05-06 NOTE — Progress Notes (Signed)
[name] [address], [place] [address] Phone: [phone] Subjective:    I'm seeing this patient by the request  of:  [name], MD   CC: knee joint pains and shoulder   [id number]  Erica [name] is a 66 y.o. female coming in with complaint of of right shoulder pain and neck pain.  Patient has not been seen for almost 2 years. Has a complete rotator cuff tear of the left shoulder but states that if the right shoulder that is giving her problems. Has a past medical history significant for radicular symptoms coming from the neck. Patient's has responded somewhat to an epidural previously. Has not been able to take care of herself secondary to her husband been significantly ill and in and out of the hospital multiple times. Patient states that is radiating down her arm, waking her up at night, mild increase in weakness.     Past Medical History:  Diagnosis Date  . ADENOCARCINOMA, COLON, HX OF 03/2009  . ANEMIA   . BACK PAIN, LUMBAR   . Cancer [facility]) 2010   colon cancer  . CHF (congestive heart failure) ([place])   . COPD (chronic obstructive pulmonary disease) with emphysema ([place])   . Crohn's  02/2010   ileal ulcers, intol of entercort--refuses treatment  . GERD   . History of transfusion of whole blood   . HYPERLIPIDEMIA   . HYPERTENSION   . Microscopic hematuria    chronic  . Obstruction of intestine or colon    adhesions  . OSTEOARTHRITIS   . Rectal fissure    Possible fissure  . Rotator cuff tear   . Takotsubo syndrome 12/2008  . URINARY INCONTINENCE    Past Surgical History:  Procedure Laterality Date  . ABDOMINAL HYSTERECTOMY  1994  . APPENDECTOMY  1964  . BREAST SURGERY    . CESAREAN SECTION     x 3  . CHOLECYSTECTOMY  1995  . COLON SURGERY  03/2009   hemicolectomy colon cancer  . COLONOSCOPY W/ BIOPSIES AND POLYPECTOMY  01/24/2011   (Crohn's)ileitis, internal hemorrhoids  . HAND SURGERY  1991  . KNEE  SURGERY Right 1981  . right knee replacement  03/2010  . TONSILLECTOMY  1952  . UPPER GASTROINTESTINAL ENDOSCOPY  05/16/2010   normal  . VIDEO BRONCHOSCOPY Bilateral 12/18/2014   Procedure: VIDEO BRONCHOSCOPY WITHOUT FLUORO;  Surgeon: Tanda Rockers, MD;  Location: [facility];  Service: Cardiopulmonary;  Laterality: Bilateral;   Social History   Social History  . Marital status: Married    Spouse name: N/A  . Number of children: N/A  . Years of education: N/A   Occupational History  . Retired  Unemployed   Social History Main Topics  . Smoking status: Current Every Day Smoker    Packs/day: 0.50    Years: 48.00    Types: Cigarettes  . Smokeless tobacco: Never Used  . Alcohol use No  . Drug use: No  . Sexual activity: Not Asked   Other Topics Concern  . None   Social History Narrative   Lives with husband and 2 grand dtr; 9 and 16      Allergies  Allergen Reactions  . Morphine Nausea And Vomiting   Family History  Problem Relation Age of Onset  . Colon cancer Father 60  . Hypertension Father   . Heart disease Father   . Kidney disease Father   . Colon cancer Sister 47  . Throat  cancer Mother   . Arthritis Other     Parent, other relative    Past medical history, social, surgical and family history all reviewed in electronic medical record.  No pertanent information unless stated regarding to the chief complaint.   Review of Systems:Review of systems updated and as accurate as of 05/08/16 Patient has symmetric and positive for headaches, muscle pains, muscle aches, multiple pains in the knees as well. Some joint swelling she states. Abdominal pain from time to time as well.  Objective  Blood pressure 122/84, pulse 83, weight 135 lb (61.2 kg), SpO2 98 %. Systems examined below as of 05/08/16   General: No apparent distress alert and oriented x3 mood and affect normal, dressed appropriately.  HEENT: Pupils equal, extraocular movements intact  Respiratory:  Patient's speak in full sentences and does not appear short of breath  Cardiovascular: No lower extremity edema, non tender, no erythema  Skin: Warm dry intact with no signs of infection or rash on extremities or on axial skeleton.  Abdomen: Soft nontender  Neuro: Cranial nerves II through XII are intact, neurovascularly intact in all extremities with 2+ DTRs and 2+ pulses.  Lymph: No lymphadenopathy of posterior or anterior cervical chain or axillae bilaterally.  Gait normal with good balance and coordination.  MSK:  Non tender with full range of motion and good stability and symmetric strength and tone of  elbows, wrist, hip, knee and ankles bilaterally.  Neck: Inspection unremarkable. No palpable stepoffs. Positive Spurling's maneuver. Radicular symptoms going on the right sign. Limited range of motion lacking the last 10 of extension as well as minimal sidebending bilaterally. Grip strength and sensation normal in bilateral hands Strength good C4 to T1 distribution No sensory change to C4 to T1 Negative Hoffman sign bilaterally Reflexes normal  Shoulder: Right Inspection reveals no abnormalities, atrophy or asymmetry. Palpation is normal with no tenderness over AC joint or bicipital groove. ROM is full in all planes passively. Rotator cuff strength normal throughout. signs of impingement with positive Neer and Hawkin's tests, but negative empty can sign. Speeds and Yergason's tests normal. No labral pathology noted with negative Obrien's, negative clunk and good stability. Normal scapular function observed. No painful arc and no drop arm sign. No apprehension sign   Procedure: Real-time Ultrasound Guided Injection of right glenohumeral joint Device: GE Logiq E  Ultrasound guided injection is preferred based studies that show increased duration, increased effect, greater accuracy, decreased procedural pain, increased response rate with ultrasound guided versus blind injection.    Verbal informed consent obtained.  Time-out conducted.  Noted no overlying erythema, induration, or other signs of local infection.  Skin prepped in a sterile fashion.  Local anesthesia: Topical Ethyl chloride.  With sterile technique and under real time ultrasound guidance:  Joint visualized.  23g 1  inch needle inserted posterior approach. Pictures taken for needle placement. Patient did have injection of 2 cc of 1% lidocaine, 2 cc of 0.5% Marcaine, and 1.0 cc of Kenalog 40 mg/dL. Completed without difficulty  Pain immediately resolved suggesting accurate placement of the medication.  Advised to call if fevers/chills, erythema, induration, drainage, or persistent bleeding.  Images permanently stored and available for review in the ultrasound unit.  Impression: Technically successful ultrasound guided injection.    Impression and Recommendations:     This case required medical decision making of moderate complexity.      Note: This dictation was prepared with Dragon dictation along with smaller phrase technology. Any transcriptional errors that result  from this process are unintentional.

## 2016-05-08 ENCOUNTER — Other Ambulatory Visit (INDEPENDENT_AMBULATORY_CARE_PROVIDER_SITE_OTHER): Payer: Medicare Other

## 2016-05-08 ENCOUNTER — Encounter: Payer: Self-pay | Admitting: Family Medicine

## 2016-05-08 ENCOUNTER — Ambulatory Visit (INDEPENDENT_AMBULATORY_CARE_PROVIDER_SITE_OTHER): Payer: Medicare Other | Admitting: Family Medicine

## 2016-05-08 ENCOUNTER — Ambulatory Visit: Payer: Self-pay

## 2016-05-08 VITALS — BP 122/84 | HR 83 | Wt 135.0 lb

## 2016-05-08 DIAGNOSIS — M5010 Cervical disc disorder with radiculopathy, unspecified cervical region: Secondary | ICD-10-CM

## 2016-05-08 DIAGNOSIS — G8929 Other chronic pain: Secondary | ICD-10-CM

## 2016-05-08 DIAGNOSIS — M2550 Pain in unspecified joint: Secondary | ICD-10-CM

## 2016-05-08 DIAGNOSIS — M7550 Bursitis of unspecified shoulder: Secondary | ICD-10-CM

## 2016-05-08 DIAGNOSIS — M25511 Pain in right shoulder: Secondary | ICD-10-CM | POA: Diagnosis not present

## 2016-05-08 LAB — SEDIMENTATION RATE: Sed Rate: 16 mm/hr (ref 0–30)

## 2016-05-08 LAB — IBC PANEL
Iron: 31 ug/dL — ABNORMAL LOW (ref 42–145)
Saturation Ratios: 8.6 % — ABNORMAL LOW (ref 20.0–50.0)
Transferrin: 258 mg/dL (ref 212.0–360.0)

## 2016-05-08 LAB — C-REACTIVE PROTEIN: CRP: 0.1 mg/dL — ABNORMAL LOW (ref 0.5–20.0)

## 2016-05-08 LAB — VITAMIN D 25 HYDROXY (VIT D DEFICIENCY, FRACTURES): VITD: 23.22 ng/mL — ABNORMAL LOW (ref 30.00–100.00)

## 2016-05-08 MED ORDER — GABAPENTIN 100 MG PO CAPS: 200.0000 mg | ORAL_CAPSULE | Freq: Every day | ORAL | 1 refills | Status: DC

## 2016-05-08 NOTE — Assessment & Plan Note (Signed)
Labs pending.  

## 2016-05-08 NOTE — Assessment & Plan Note (Signed)
I believe most the pain is secondary to patient's radicular symptoms of the neck. Patient will start gabapentin again. We discussed icing regimen and home exercises. We discussed which activities to do a which was to avoid. Patient will try to be more active. Injected patient's right shoulder to rule out any shoulder pathology that could be contribute in. Patient will come back and see me again in 4 weeks. We did discuss the possibility of epidurals if necessary.

## 2016-05-08 NOTE — Patient Instructions (Signed)
Good to see you  Ice 20 minutes 2 times daily. Usually after activity and before bed. pennsaid pinkie amount topically 2 times daily as needed.  Gabapentin 242m at night Tried to inject shoulder If not better write [place] we will order the epidural for the neck.  Get labs downstairs as well to make sure nothing else is going on.  See me again in 4 weeks.

## 2016-05-08 NOTE — Assessment & Plan Note (Signed)
Patient given an injection for the bursitis-like pain. We discussed icing regimen and home exercise. Patient will try to do activity on a regular basis. Home exercises reiterated. Labs ordered to rule out any other underlying systemic illness 6 be contribute in. Follow-up again in 4 weeks

## 2016-05-09 LAB — RHEUMATOID FACTOR: Rhuematoid fact SerPl-aCnc: <14 IU/mL (ref <14)

## 2016-05-09 LAB — ANGIOTENSIN CONVERTING ENZYME: Angiotensin-Converting Enzyme: 31 U/L (ref 9–67)

## 2016-05-09 LAB — CYCLIC CITRUL PEPTIDE ANTIBODY, IGG: Cyclic Citrullin Peptide Ab: 57 Units — ABNORMAL HIGH

## 2016-05-10 LAB — ANA: Anti Nuclear Antibody(ANA): NEGATIVE

## 2016-06-01 ENCOUNTER — Telehealth: Payer: Self-pay | Admitting: *Deleted

## 2016-06-01 MED ORDER — HYDROCODONE-ACETAMINOPHEN 5-325 MG PO TABS: 1.0000 | ORAL_TABLET | Freq: Two times a day (BID) | ORAL | 0 refills | Status: DC | PRN

## 2016-06-01 NOTE — Telephone Encounter (Signed)
Rec'd call pt requesting refill on her Hydrocodone.../lmb 

## 2016-06-01 NOTE — Telephone Encounter (Signed)
Notified pt rx ready for pick-up.../lmb 

## 2016-06-03 NOTE — Progress Notes (Signed)
[name] [address], [place] [address] Phone: [phone] Subjective:    I'm seeing this patient by the request  of:  [name], MD   CC: knee joint pains and shoulder   [id number]  Erica [name] is a 66 y.o. female coming in with complaint of of right shoulder pain and neck pain.   Patient was given an injection in the right shoulder but patient was also having more of a polymyalgia. Patient did have labs. Was unable to discuss with patient until now. Patient's laboratory workup did show an elevated CCP, low vitamin D as well as low iron. Patient also has known DDD  Of the cervical spine that did respond fairly well to an epidural female 2 years ago. Patient states that she continues to have pain overall. No significant changes. Patient did try to make some of the changes we discussed but nothing severe at this moment.    Past Medical History:  Diagnosis Date  . ADENOCARCINOMA, COLON, HX OF 03/2009  . ANEMIA   . BACK PAIN, LUMBAR   . Cancer [facility]) 2010   colon cancer  . CHF (congestive heart failure) ([place])   . COPD (chronic obstructive pulmonary disease) with emphysema ([place])   . Crohn's  02/2010   ileal ulcers, intol of entercort--refuses treatment  . GERD   . History of transfusion of whole blood   . HYPERLIPIDEMIA   . HYPERTENSION   . Microscopic hematuria    chronic  . Obstruction of intestine or colon    adhesions  . OSTEOARTHRITIS   . Rectal fissure    Possible fissure  . Rotator cuff tear   . Takotsubo syndrome 12/2008  . URINARY INCONTINENCE    Past Surgical History:  Procedure Laterality Date  . ABDOMINAL HYSTERECTOMY  1994  . APPENDECTOMY  1964  . BREAST SURGERY    . CESAREAN SECTION     x 3  . CHOLECYSTECTOMY  1995  . COLON SURGERY  03/2009   hemicolectomy colon cancer  . COLONOSCOPY W/ BIOPSIES AND POLYPECTOMY  01/24/2011   (Crohn's)ileitis, internal hemorrhoids  . HAND SURGERY  1991  . KNEE  SURGERY Right 1981  . right knee replacement  03/2010  . TONSILLECTOMY  1952  . UPPER GASTROINTESTINAL ENDOSCOPY  05/16/2010   normal  . VIDEO BRONCHOSCOPY Bilateral 12/18/2014   Procedure: VIDEO BRONCHOSCOPY WITHOUT FLUORO;  Surgeon: Tanda Rockers, MD;  Location: [facility];  Service: Cardiopulmonary;  Laterality: Bilateral;   Social History   Social History  . Marital status: Married    Spouse name: N/A  . Number of children: N/A  . Years of education: N/A   Occupational History  . Retired  Unemployed   Social History Main Topics  . Smoking status: Current Every Day Smoker    Packs/day: 0.50    Years: 48.00    Types: Cigarettes  . Smokeless tobacco: Never Used  . Alcohol use No  . Drug use: No  . Sexual activity: Not Asked   Other Topics Concern  . None   Social History Narrative   Lives with husband and 2 grand dtr; 9 and 16      Allergies  Allergen Reactions  . Morphine Nausea And Vomiting   Family History  Problem Relation Age of Onset  . Colon cancer Father 41  . Hypertension Father   . Heart disease Father   . Kidney disease Father   . Colon cancer Sister 3  .  Throat cancer Mother   . Arthritis Other     Parent, other relative    Past medical history, social, surgical and family history all reviewed in electronic medical record.  No pertanent information unless stated regarding to the chief complaint.   Review of Systems: No headache, visual changes, nausea, vomiting, diarrhea, constipation, dizziness, abdominal pain, skin rash, fevers, chills, night sweats, weight loss, swollen lymph nodes, chest pain, shortness of breath, mood changes.  Positive body aches and muscle aches.    Objective  Blood pressure 100/78, pulse 74, height 5' 2"  (1.575 m), weight 131 lb (59.4 kg).   Systems examined below as of 06/05/16 General: NAD A&O x3 mood, affect normal  HEENT: Pupils equal, extraocular movements intact no nystagmus Respiratory: not short of breath  at rest or with speaking Cardiovascular: No lower extremity edema, non tender Skin: Warm dry intact with no signs of infection or rash on extremities or on axial skeleton. Abdomen: Soft nontender, no masses Neuro: Cranial nerves  intact, neurovascularly intact in all extremities with 2+ DTRs and 2+ pulses. Lymph: No lymphadenopathy appreciated today  Gait normal with good balance and coordination.  MSK: Non tender with full range of motion and good stability and symmetric strength and tone of shoulders, elbows, wrist,  knee hips and ankles bilaterally.   Neck: Inspection unremarkable. No palpable stepoffs. Positive- radicular symptoms still on the right.  Continue mild limitation in extension as well as side bending bilaterally  Grip strength and sensation normal in bilateral hands Strength good C4 to T1 distribution No sensory change to C4 to T1 Negative Hoffman sign bilaterally Reflexes normal    Impression and Recommendations:     This case required medical decision making of moderate complexity.      Note: This dictation was prepared with Dragon dictation along with smaller phrase technology. Any transcriptional errors that result from this process are unintentional.

## 2016-06-05 ENCOUNTER — Encounter: Payer: Self-pay | Admitting: Family Medicine

## 2016-06-05 ENCOUNTER — Ambulatory Visit (INDEPENDENT_AMBULATORY_CARE_PROVIDER_SITE_OTHER): Payer: Medicare Other | Admitting: Family Medicine

## 2016-06-05 VITALS — BP 100/78 | HR 74 | Ht 62.0 in | Wt 131.0 lb

## 2016-06-05 DIAGNOSIS — R768 Other specified abnormal immunological findings in serum: Secondary | ICD-10-CM | POA: Insufficient documentation

## 2016-06-05 DIAGNOSIS — G8929 Other chronic pain: Secondary | ICD-10-CM | POA: Diagnosis not present

## 2016-06-05 DIAGNOSIS — M2550 Pain in unspecified joint: Secondary | ICD-10-CM

## 2016-06-05 DIAGNOSIS — M25511 Pain in right shoulder: Secondary | ICD-10-CM | POA: Diagnosis not present

## 2016-06-05 DIAGNOSIS — M5010 Cervical disc disorder with radiculopathy, unspecified cervical region: Secondary | ICD-10-CM

## 2016-06-05 MED ORDER — VITAMIN D (ERGOCALCIFEROL) 1.25 MG (50000 UNIT) PO CAPS: 50000.0000 [IU] | ORAL_CAPSULE | ORAL | 0 refills | Status: DC

## 2016-06-05 NOTE — Patient Instructions (Addendum)
Good to see you  You need once weekly vitamin D for next 12 weeks.  Iron 15m daily with 5051mof vitamin C I would consider you talking to a rheumatologist due to 1 lab test that came back positive and your symptoms.  See me again in 4-6 weeks and we will discuss an epidural if not much improved but I am optimistic.

## 2016-06-05 NOTE — Assessment & Plan Note (Signed)
Patient did not respond to the injection in the shoulder. I do believe that this is likely contributing to most of the discomfort. Patient declined another epidural at this time. Encourage her to continue to do the other exercises. Was found ago have low vitamin D think could be contributing to some of the discomfort. We will try and supplementation. Follow-up again in 4-6 weeks.

## 2016-06-05 NOTE — Assessment & Plan Note (Signed)
Likely secondary to more of a cervical radiculopathy. No significant improvement with the injection. Encourage patient to increase activity slowly. Changes per medications. Follow-up again 4-6 week.

## 2016-06-05 NOTE — Assessment & Plan Note (Signed)
Referred to rheumatology for further evaluation. Worsening symptoms overall. Positive family history of rheumatoid arthritis

## 2016-07-01 NOTE — Progress Notes (Signed)
[name] [address], [place] [address] Phone: [phone] Subjective:    I'm seeing this patient by the request  of:  [name], MD   CC: knee joint pains and shoulder f/u   [id number]  Erica [name] is a 66 y.o. female coming in with complaint of of right shoulder pain and neck pain.   Patient was given an injection in the right shoulder but patient was also having more of a polymyalgia. Patient did have labs. Was unable to discuss with patient until now. Patient's laboratory workup did show an elevated CCP, low vitamin D as well as low iron. Patient also has known DDD  Of the cervical spine that did respond fairly well to an epidural female 2 years ago. Patient went to continue with conservative therapy. Patient was to continue the over-the-counter medications. Patient did have a positive anti-CCP and was referred to rheumatology. Patient states She continues to have significant amount of back pain. Patient has not been able to see rheumatology at. An states that she does not know she has actually had a phone call. Patient states that the pain is severe that is stopping her from activities. States that it seems to migrate from one joint to another joint. Seems to be the knees and then sometimes seems to be the neck then will go to her shoulders.      Past Medical History:  Diagnosis Date  . ADENOCARCINOMA, COLON, HX OF 03/2009  . ANEMIA   . BACK PAIN, LUMBAR   . Cancer [facility]) 2010   colon cancer  . CHF (congestive heart failure) ([place])   . COPD (chronic obstructive pulmonary disease) with emphysema ([place])   . Crohn's  02/2010   ileal ulcers, intol of entercort--refuses treatment  . GERD   . History of transfusion of whole blood   . HYPERLIPIDEMIA   . HYPERTENSION   . Microscopic hematuria    chronic  . Obstruction of intestine or colon    adhesions  . OSTEOARTHRITIS   . Rectal fissure    Possible fissure  . Rotator  cuff tear   . Takotsubo syndrome 12/2008  . URINARY INCONTINENCE    Past Surgical History:  Procedure Laterality Date  . ABDOMINAL HYSTERECTOMY  1994  . APPENDECTOMY  1964  . BREAST SURGERY    . CESAREAN SECTION     x 3  . CHOLECYSTECTOMY  1995  . COLON SURGERY  03/2009   hemicolectomy colon cancer  . COLONOSCOPY W/ BIOPSIES AND POLYPECTOMY  01/24/2011   (Crohn's)ileitis, internal hemorrhoids  . HAND SURGERY  1991  . KNEE SURGERY Right 1981  . right knee replacement  03/2010  . TONSILLECTOMY  1952  . UPPER GASTROINTESTINAL ENDOSCOPY  05/16/2010   normal  . VIDEO BRONCHOSCOPY Bilateral 12/18/2014   Procedure: VIDEO BRONCHOSCOPY WITHOUT FLUORO;  Surgeon: Tanda Rockers, MD;  Location: [facility];  Service: Cardiopulmonary;  Laterality: Bilateral;   Social History   Social History  . Marital status: Married    Spouse name: N/A  . Number of children: N/A  . Years of education: N/A   Occupational History  . Retired  Unemployed   Social History Main Topics  . Smoking status: Current Every Day Smoker    Packs/day: 0.50    Years: 48.00    Types: Cigarettes  . Smokeless tobacco: Never Used  . Alcohol use No  . Drug use: No  . Sexual activity: Not Asked  Other Topics Concern  . None   Social History Narrative   Lives with husband and 2 grand dtr; 9 and 16      Allergies  Allergen Reactions  . Morphine Nausea And Vomiting   Family History  Problem Relation Age of Onset  . Colon cancer Father 73  . Hypertension Father   . Heart disease Father   . Kidney disease Father   . Colon cancer Sister 64  . Throat cancer Mother   . Arthritis Other     Parent, other relative    Past medical history, social, surgical and family history all reviewed in electronic medical record.  No pertanent information unless stated regarding to the chief complaint.   Review of Systems: No headache, visual changes, nausea, vomiting, diarrhea, constipation, dizziness, abdominal pain,  skin rash, , chest pain, shortness of breath, mood changes.      Objective  Blood pressure 112/78, pulse 69, resp. rate (!) 96, weight 129 lb 0.6 oz (58.5 kg).   Systems examined below as of 07/03/16 General: NAD A&O x3 mood, affect normal  HEENT: Pupils equal, extraocular movements intact no nystagmus Respiratory: not short of breath at rest or with speaking Cardiovascular: No lower extremity edema, non tender Skin: Warm dry intact with no signs of infection or rash on extremities or on axial skeleton. Abdomen: Soft nontender, no masses Neuro: Cranial nerves  intact, neurovascularly intact in all extremities with 2+ DTRs and 2+ pulses. Lymph: No lymphadenopathy appreciated today  Gait normal with good balance and coordination.  MSK: tender with full range of motion and good stability and symmetric strength and tone of shoulders, elbows, wrist,  knee hips and ankles bilaterally.   Pain in patient's joint seems to be out of proportion for the amount of palpation  Neck: Inspection unremarkable. No palpable stepoffs. Positive- radicular symptoms still on the right.  Continue mild limitation in extension as well as side bending bilaterally  Grip strength and sensation normal in bilateral hands Strength good C4 to T1 distribution No sensory change to C4 to T1 Negative Hoffman sign bilaterally Reflexes normal Bilateral knee pain seems to be severe. No effusion noted.   Impression and Recommendations:     This case required medical decision making of moderate complexity.      Note: This dictation was prepared with Dragon dictation along with smaller phrase technology. Any transcriptional errors that result from this process are unintentional.

## 2016-07-03 ENCOUNTER — Ambulatory Visit (INDEPENDENT_AMBULATORY_CARE_PROVIDER_SITE_OTHER)
Admission: RE | Admit: 2016-07-03 | Discharge: 2016-07-03 | Disposition: A | Discharge status: Home / Self Care | Payer: Medicare Other | Source: Ambulatory Visit | Attending: Family Medicine | Admitting: Family Medicine

## 2016-07-03 ENCOUNTER — Telehealth: Payer: Self-pay | Admitting: Family Medicine

## 2016-07-03 ENCOUNTER — Ambulatory Visit (INDEPENDENT_AMBULATORY_CARE_PROVIDER_SITE_OTHER): Payer: Medicare Other | Admitting: Family Medicine

## 2016-07-03 ENCOUNTER — Encounter: Payer: Self-pay | Admitting: Family Medicine

## 2016-07-03 VITALS — BP 112/78 | HR 69 | Resp 96 | Wt 129.0 lb

## 2016-07-03 DIAGNOSIS — R05 Cough: Secondary | ICD-10-CM

## 2016-07-03 DIAGNOSIS — M2550 Pain in unspecified joint: Secondary | ICD-10-CM | POA: Diagnosis not present

## 2016-07-03 DIAGNOSIS — R768 Other specified abnormal immunological findings in serum: Secondary | ICD-10-CM | POA: Diagnosis not present

## 2016-07-03 DIAGNOSIS — C189 Malignant neoplasm of colon, unspecified: Secondary | ICD-10-CM | POA: Diagnosis not present

## 2016-07-03 DIAGNOSIS — R053 Chronic cough: Secondary | ICD-10-CM

## 2016-07-03 NOTE — Telephone Encounter (Signed)
Sent referral to [facility] t see Dr. Amil Amen per Dr. [name] request..[name]

## 2016-07-03 NOTE — Patient Instructions (Addendum)
Good to see you  We will get you with the stomach docs as well  Chest xray downstairs today  [phone] for Dr. Amil Amen.  pennsaid pinkie amount topically 2 times daily as needed.  I am hoping we get answers and probably worth a mammogram as well and you can talk to [place] about it.  We will discuss results

## 2016-07-03 NOTE — Progress Notes (Signed)
Pre-visit discussion using our clinic review tool. No additional management support is needed unless otherwise documented below in the visit note.  

## 2016-07-03 NOTE — Assessment & Plan Note (Signed)
Patient is supposed to be seen a rheumatologist. I do believe the patient likely has some type of autoimmune disease that has contributed to some of the discomfort. We discussed with patient at great length. Patient has had weight loss recently as well. History of potential colon cancer. Referred back to gastroenterology for further evaluation. Patient also had a chest x-ray today for further evaluation to make sure there is no other possibility of primary cancers. Discussed with patient again at great length about other signs and symptoms and when to seek medical attention. Continue all the medications. We'll not do pain medications which patient is already on. Patient will come back and see me again any questions in 4-6 weeks.  Spent  25 minutes with patient face-to-face and had greater than 50% of counseling including as described above in assessment and plan.

## 2016-07-03 NOTE — Telephone Encounter (Signed)
Patient notified of referral and CXR-normal

## 2016-07-03 NOTE — Telephone Encounter (Signed)
Patient called back about referral to Dr. Amil Amen.  Called Dr. Melissa Noon office and they do not have a referral from Dr. [name].  Can referral be faxed again?  Please follow up with patient in regard.  Thanks!

## 2016-07-03 NOTE — Telephone Encounter (Signed)
Refaxed referral to [place] [phone] (fax)

## 2016-07-04 ENCOUNTER — Telehealth: Payer: Self-pay | Admitting: Family Medicine

## 2016-07-04 NOTE — Telephone Encounter (Signed)
Patient notified we can refer her to [facility] with Dr. Estanislado Pandy Patient would like to go to [facility] instead of Dr. Amil Amen due to the process of being accepted as a patient or denied.

## 2016-07-04 NOTE — Telephone Encounter (Signed)
To pt called and wanted you to call her back. She said you spoke with her yesterday about referring her to a Rheumatologist. She said that she called their office today to see if they got the referral and to make an appointment. She was told that they did receive it but that she could not make an appointment until after the doctor reviews it (3-4 days) and decides whether he wants to see her or not. She said that she no longer wants to see that doctor or go to that practice.. Is there another office that she can be referred to? Please advise. Thanks [employer]

## 2016-07-04 NOTE — Telephone Encounter (Signed)
fine

## 2016-07-05 ENCOUNTER — Ambulatory Visit: Payer: Medicare Other | Admitting: Internal Medicine

## 2016-07-06 ENCOUNTER — Telehealth: Payer: Self-pay | Admitting: Family Medicine

## 2016-07-06 ENCOUNTER — Other Ambulatory Visit: Payer: Self-pay

## 2016-07-06 DIAGNOSIS — M2550 Pain in unspecified joint: Secondary | ICD-10-CM

## 2016-07-06 NOTE — Telephone Encounter (Signed)
Spoke with patient. Told her that referral was sent to [place] referral had not been deleted so that is why it is still in her chart. Patient voiced understanding and it going to wait a few days before calling [place] to schedule an appointment.

## 2016-07-06 NOTE — Telephone Encounter (Signed)
Pt states referral was referred to Dr. Amil Amen, pt doesnt want to to see him , She wants to see [facility]. Please advise.

## 2016-07-10 ENCOUNTER — Encounter: Payer: Self-pay | Admitting: Internal Medicine

## 2016-07-10 ENCOUNTER — Ambulatory Visit (INDEPENDENT_AMBULATORY_CARE_PROVIDER_SITE_OTHER): Payer: Medicare Other | Admitting: Internal Medicine

## 2016-07-10 VITALS — BP 120/74 | HR 69 | Temp 97.8°F | Resp 12 | Ht 62.0 in | Wt 129.0 lb

## 2016-07-10 DIAGNOSIS — Z720 Tobacco use: Secondary | ICD-10-CM

## 2016-07-10 DIAGNOSIS — F17210 Nicotine dependence, cigarettes, uncomplicated: Secondary | ICD-10-CM

## 2016-07-10 DIAGNOSIS — L989 Disorder of the skin and subcutaneous tissue, unspecified: Secondary | ICD-10-CM | POA: Diagnosis not present

## 2016-07-10 DIAGNOSIS — R634 Abnormal weight loss: Secondary | ICD-10-CM

## 2016-07-10 DIAGNOSIS — Z1231 Encounter for screening mammogram for malignant neoplasm of breast: Secondary | ICD-10-CM | POA: Diagnosis not present

## 2016-07-10 DIAGNOSIS — R768 Other specified abnormal immunological findings in serum: Secondary | ICD-10-CM

## 2016-07-10 NOTE — Progress Notes (Signed)
Pre visit review using our clinic review tool, if applicable. No additional management support is needed unless otherwise documented below in the visit note. 

## 2016-07-10 NOTE — Patient Instructions (Signed)
We will get the mammogram done and the lung cancer screening.

## 2016-07-11 ENCOUNTER — Telehealth: Payer: Self-pay | Admitting: Internal Medicine

## 2016-07-11 DIAGNOSIS — R21 Rash and other nonspecific skin eruption: Secondary | ICD-10-CM | POA: Insufficient documentation

## 2016-07-11 MED ORDER — HYDROCODONE-ACETAMINOPHEN 5-325 MG PO TABS: 1.0000 | ORAL_TABLET | Freq: Two times a day (BID) | ORAL | 0 refills | Status: DC | PRN

## 2016-07-11 NOTE — Telephone Encounter (Signed)
Pt is wanting to see a dermatologist. She would like to see if Dr Sharlet Salina could send something over for her. She discussed the issues that she was having with her yesterday.

## 2016-07-11 NOTE — Progress Notes (Signed)
   Subjective:    Patient ID: Erica [name], female    DOB: 12-27-1950, 66 y.o.   MRN: [id number]  HPI The patient is a 66 YO female coming in for concerns about ongoing weight loss. She does have history of colon cancer but is up to date on screening with last 2017. She has not had mammogram and declined at last visit but now is willing to do that. She had CXR from sports medicine without changes but is an ongoing smoker (with nodule and bronchoscopy back in 2016 without malignant cells). She has been eating more since last visit but still not gaining weight. She is down another 5 pounds since February. She is not trying to lose weight.  She is also having new arthritis pains and newly discovered to have RA, she is going to see rheumatologist soon for workup of this as well. Pain in most of her joints migratory with some stiffness. No joint deformity.  Other concern is her smoking, she would like to get screened for lung cancer. She has been a smoker for almost 50 years and was 1 PPD smoker at highest point, now smoking 1/2 PPD for last 10 years or so. Denies cough or sputum production at this time and no SOB.  Also having some skin lesions on her face and neck which were shaved in the past and have grown back. She is concerned as they were pre-cancerous in the past.   Review of Systems  Constitutional: Positive for activity change, appetite change, fatigue and unexpected weight change.  HENT: Negative.   Eyes: Negative.   Respiratory: Negative for cough, chest tightness, shortness of breath and wheezing.   Cardiovascular: Negative.   Gastrointestinal: Positive for diarrhea. Negative for abdominal distention, abdominal pain, nausea and vomiting.       Chronic and stable  Musculoskeletal: Negative.   Skin: Negative.   Neurological: Negative.   Psychiatric/Behavioral: Positive for decreased concentration and dysphoric mood.     Objective:   Physical Exam  Constitutional: She is oriented  to person, place, and time. She appears well-developed and well-nourished.  Temporal wasting  HENT:  Head: Normocephalic and atraumatic.  Eyes: EOM are normal.  Neck: Normal range of motion.  Cardiovascular: Normal rate and regular rhythm.   Pulmonary/Chest: Effort normal and breath sounds normal. No respiratory distress. She has no wheezes. She has no rales.  Abdominal: Soft. She exhibits no distension. There is no tenderness. There is no rebound.  Neurological: She is alert and oriented to person, place, and time.  Skin: Skin is warm and dry.   Vitals:   07/10/16 0938  BP: 120/74  Pulse: 69  Resp: 12  Temp: 97.8 F (36.6 C)  TempSrc: Oral  SpO2: 99%  Weight: 129 lb (58.5 kg)  Height: 5' 2"  (1.575 m)      Assessment & Plan:

## 2016-07-11 NOTE — Telephone Encounter (Signed)
Pt said that Dr Dora Sims still has not received the referral. They asked if it could be faxed over. Their fax number is [phone].

## 2016-07-11 NOTE — Telephone Encounter (Signed)
Patient aware Rx is ready for pick up

## 2016-07-11 NOTE — Telephone Encounter (Signed)
We have placed the referral for dermatology. I'm not sure what she is asking for.

## 2016-07-11 NOTE — Assessment & Plan Note (Signed)
Referral for lung cancer screening due to pack year history.

## 2016-07-11 NOTE — Telephone Encounter (Signed)
Referral has been re-faxed.

## 2016-07-11 NOTE — Telephone Encounter (Signed)
Pt called requesting a refill on her HYDROcodone-acetaminophen (NORCO/VICODIN) 5-325 MG tablet. She was here for an appointment with Dr Sharlet Salina yesterday. Please advise.

## 2016-07-11 NOTE — Assessment & Plan Note (Signed)
New rheumatoid arthritis diagnosed. Will be going to see rheumatology soon.

## 2016-07-11 NOTE — Assessment & Plan Note (Signed)
Ordered mammogram, CXR done, colonoscopy up to date. Labs without etiology. Will continue to monitor closely and get mammogram. Encouraged lung cancer CT screening which she agrees to do.

## 2016-07-11 NOTE — Addendum Note (Signed)
Addended by: Pricilla Holm A on: 07/11/2016 10:22 AM   Modules accepted: Orders

## 2016-07-11 NOTE — Telephone Encounter (Signed)
Printed and signed.  

## 2016-07-11 NOTE — Telephone Encounter (Signed)
Patient aware.

## 2016-07-11 NOTE — Assessment & Plan Note (Signed)
Referral to dermatology for possible removal again due to re-growth. Have not grown significantly recently.

## 2016-07-14 ENCOUNTER — Ambulatory Visit: Payer: Medicare Other | Admitting: Physician Assistant

## 2016-07-31 ENCOUNTER — Other Ambulatory Visit: Payer: Self-pay | Admitting: Interventional Cardiology

## 2016-08-09 ENCOUNTER — Encounter: Payer: Self-pay | Admitting: Internal Medicine

## 2016-08-09 ENCOUNTER — Ambulatory Visit (INDEPENDENT_AMBULATORY_CARE_PROVIDER_SITE_OTHER): Payer: Medicare Other | Admitting: Internal Medicine

## 2016-08-09 DIAGNOSIS — R634 Abnormal weight loss: Secondary | ICD-10-CM

## 2016-08-09 MED ORDER — HYDROCODONE-ACETAMINOPHEN 5-325 MG PO TABS: 1.0000 | ORAL_TABLET | Freq: Two times a day (BID) | ORAL | 0 refills | Status: DC | PRN

## 2016-08-09 NOTE — Assessment & Plan Note (Signed)
Weight is still going down about 2 pounds in the last month. For completeness she should have mammogram and lung cancer screening with her ongoing smoking. She elects to wait on those.

## 2016-08-09 NOTE — Patient Instructions (Signed)
Still think about getting the mammogram done and the lung cancer screening when you can.   We will see you back in about 2-3 months to watch the weight.

## 2016-08-09 NOTE — Progress Notes (Signed)
   Subjective:    Patient ID: Erica [name], female    DOB: Jun 09, 1950, 66 y.o.   MRN: [id number]  HPI The patient is a 66 YO female coming in for follow up of her ongoing weight loss. She is eating very well. Down about 2 pounds in the last month. Will be seeing GI tomorrow to discuss her crohn's as well as past colon cancer. She is up to date on colonoscopy. She has not gotten her mammogram done as requested and did not schedule with lung cancer screening as well. She does have a visit with rheumatology later this month and is waiting to do anything else until then as she feels that the inflammation from the RA is causing the weight loss.   Review of Systems  Constitutional: Positive for fatigue and unexpected weight change. Negative for activity change, appetite change, chills and fever.  Respiratory: Negative.   Cardiovascular: Negative.   Gastrointestinal: Negative.   Musculoskeletal: Positive for arthralgias, back pain and myalgias.  Skin: Negative.   Neurological: Negative.       Objective:   Physical Exam  Constitutional: She is oriented to person, place, and time. She appears well-developed and well-nourished.  Appears to have lost weight recently.   HENT:  Head: Normocephalic and atraumatic.  Eyes: EOM are normal.  Neck: Normal range of motion.  Cardiovascular: Normal rate and regular rhythm.   Pulmonary/Chest: Effort normal.  Abdominal: Soft.  Neurological: She is alert and oriented to person, place, and time. Coordination normal.  Skin: Skin is warm and dry.   Vitals:   08/09/16 0915  BP: 124/76  Pulse: 75  Resp: 12  Temp: 98.5 F (36.9 C)  TempSrc: Oral  SpO2: 98%  Weight: 127 lb (57.6 kg)  Height: 5' 2"  (1.575 m)      Assessment & Plan:

## 2016-08-09 NOTE — Progress Notes (Signed)
Pre visit review using our clinic review tool, if applicable. No additional management support is needed unless otherwise documented below in the visit note. 

## 2016-08-10 ENCOUNTER — Encounter: Payer: Self-pay | Admitting: Internal Medicine

## 2016-08-10 ENCOUNTER — Ambulatory Visit (INDEPENDENT_AMBULATORY_CARE_PROVIDER_SITE_OTHER): Payer: Medicare Other | Admitting: Internal Medicine

## 2016-08-10 VITALS — BP 117/72 | HR 72 | Ht 62.0 in | Wt 126.8 lb

## 2016-08-10 DIAGNOSIS — K5000 Crohn's disease of small intestine without complications: Secondary | ICD-10-CM

## 2016-08-10 DIAGNOSIS — R634 Abnormal weight loss: Secondary | ICD-10-CM | POA: Diagnosis not present

## 2016-08-10 DIAGNOSIS — Z8719 Personal history of other diseases of the digestive system: Secondary | ICD-10-CM

## 2016-08-10 DIAGNOSIS — F439 Reaction to severe stress, unspecified: Secondary | ICD-10-CM

## 2016-08-10 NOTE — Progress Notes (Signed)
Erica [name] 66 y.o. Apr 07, 1950 [id number]  Assessment & Plan:   Encounter Diagnoses  Name Primary?  . Loss of weight Yes  . 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 History of dental problems    I think her weight loss is probably a combination of her dental changes and perhaps stress which is situational related to her husband's illness.  She might be leveling off at this point. Within the past year she's had a CT of the abdomen and pelvis, EGD and colonoscopy. She does have active Crohn's disease but it does not seem that symptomatic. That's certainly could be playing a role in some malabsorption but again she's pretty confident that this is stable and she's had that before without the weight loss so I suspect it's what I mentioned above. I don't think there is any role for additional workup at this time. We discussed possibly treating with budesonide but it is expensive and she had some side effects from that in the past. We have elected to observe things. She is going to see rheumatology physician soon. I will be on standby.  Her B12 level is normal but given her prior surgery and her Crohn's disease it could continue to drop so I'm going to recommend oral supplementation to her.  I appreciate the opportunity to care for this patient. CC: [name], MD Dr. Hulan Saas Dr. Bo Merino  Subjective:   Chief Complaint: weight loss  HPI The patient is a 66 year old unaccompanied white woman here for follow-up evaluation because of weight loss.  Wt Readings from Last 3 Encounters:  08/10/16 126 lb 12.8 oz (57.5 kg)  08/09/16 127 lb (57.6 kg)  07/10/16 129 lb (58.5 kg)   158 pounds in July 2017  She reports that in October or so over the last year she had all of her teeth removed in dentures placed subsequently. For some time she was edentulous on top and then she has gotten dentures but they cause some pain and she still struggling with  eating and was definitely eating differently and less. On top of that her husband has been ill, he coded when he was [facility] after a knee replacement and has been recovering but she says he's not quite the same and is requiring some care. She has been stressed over that. She says that her Crohn's disease and episodic diarrhea is unchanged over a long period of time. It does not really bother her. Last year she had an EGD and a colonoscopy was some inflammation in the terminal ileum though less than before. She had mild gastritis on EGD. CT of the abdomen and pelvis shortly before those July studies was negative. No medication changes. Appetite is good overall.  Medications, allergies, past medical history, past surgical history, family history and social history are reviewed and updated in the EMR.    Review of Systems As above, she's had a lot of joint pain, she has some serologies positive that might be suggesting rheumatoid disease and is going to see Dr. Estanislado Pandy  Objective:   Physical Exam @BP  117/72   Pulse 72   Ht 5' 2"  (1.575 m)   Wt 126 lb 12.8 oz (57.5 kg)   BMI 23.19 kg/m @  General:  NAD Eyes:   anicteric Lungs:  clear Heart::  S1S2 no rubs, murmurs or gallops Abdomen:  soft and nontender, BS+ Ext:   no edema, cyanosis or clubbing    Data  Reviewed:   As per history of present illness labs in the EMR reviewed February sedimentation rate was normal at 16 C-reactive protein normal. CBC and chemistries are normal. B12 293

## 2016-08-10 NOTE — Patient Instructions (Signed)
  No testing needed at this time.    Follow up with Dr Carlean Purl as needed.     I appreciate the opportunity to care for you. Silvano Rusk, MD, [facility]

## 2016-08-11 ENCOUNTER — Telehealth: Payer: Self-pay

## 2016-08-11 NOTE — Telephone Encounter (Signed)
-----   Message from Gatha Mayer, MD sent at 08/11/2016  1:21 PM EDT ----- Regarding: B12 supplement Please call her in the next few days and ask her to start taking 1000 g vitamin B12 orally daily. Her B12 levels are okay but I think she is at risk for this dropping and I would recommend she take supplements.

## 2016-08-11 NOTE — Telephone Encounter (Signed)
Left Orlean a message to call me back.

## 2016-08-14 NOTE — Telephone Encounter (Signed)
Reached patient by phone and informed her of the Vitamin B12 to take. She verbalized understanding and has some at home to start now.

## 2016-08-15 ENCOUNTER — Other Ambulatory Visit: Payer: Self-pay | Admitting: Acute Care

## 2016-08-15 DIAGNOSIS — F17210 Nicotine dependence, cigarettes, uncomplicated: Secondary | ICD-10-CM

## 2016-08-17 DIAGNOSIS — E559 Vitamin D deficiency, unspecified: Secondary | ICD-10-CM | POA: Insufficient documentation

## 2016-08-17 NOTE — Progress Notes (Signed)
Office Visit Note  Patient: Erica [name]             Date of Birth: September 13, 1950           MRN: [id number]             PCP: [name], MD Referring: Lyndal Pulley, DO Visit Date: 08/25/2016 Occupation: retired Education officer, museum    Subjective:  Pain of the Right Shoulder; Pain of the Lower Back; New Patient (Initial Visit); and Weight Loss (has lost 40 lbs )   History of Present Illness: Erica [name] is a 66 y.o. female in consultation per request of her PCP. According to patient she's had history of joint pain for multiple years. She recalls in 1980s she started having neck and lower back pain. She was diagnosed with this disease of C-spine and lumbar spine. She states she has had several injections to her lumbar spine and she was offered surgery but she declined. She is also had shoulder joint discomfort for multiple years. She has complete rotator cuff tear of her left shoulder. She's had recurrent problems with right subacromial bursitis. She had right total knee replacement in December 2012. She also had osteoarthritis in her left knee joint. She was offered surgery but she declined. She states she's been very active all her life and had been rockclimbing. She recently lost 40 pounds over 3-1/2 months. She had evaluation by her PCP and the labs showed anemia she was suggested multivitamins. She also went to see Dr. Alroy Dust for a sports medicine doctor who found that she had vitamin D deficiency she was placed on vitamin D. He also found that she had positive anti-CCP and for that reason she was referred to me. She gives history of pain and swelling in her bilateral hands and bilateral knee joints. She does have nocturnal pain in her bilateral shoulders. She continues to have lower back pain. Her Crohn's disease is still active. She has loose stools 3 per day without any blood or mucus.  Activities of Daily Living:  Patient reports morning stiffness for 1 hour.   Patient  Reports nocturnal pain.  Difficulty dressing/grooming: Denies Difficulty climbing stairs: Reports Difficulty getting out of chair: Reports Difficulty using hands for taps, buttons, cutlery, and/or writing: Denies   Review of Systems  Constitutional: Positive for fatigue and weight loss. Negative for night sweats, weight gain and weakness.       40 pounds in 3 months  HENT: Negative for mouth sores, trouble swallowing, trouble swallowing, mouth dryness and nose dryness.   Eyes: Negative for pain, redness, visual disturbance and dryness.  Respiratory: Positive for shortness of breath. Negative for cough and difficulty breathing.        COPD  Cardiovascular: Negative for chest pain, palpitations, hypertension, irregular heartbeat and swelling in legs/feet.  Gastrointestinal: Positive for diarrhea. Negative for blood in stool and constipation.       Due to Crohn's disease  Endocrine: Negative for increased urination.  Genitourinary: Negative for vaginal dryness.  Musculoskeletal: Positive for arthralgias, joint pain, joint swelling and morning stiffness. Negative for myalgias, muscle weakness, muscle tenderness and myalgias.  Skin: Negative for color change, rash, hair loss, skin tightness, ulcers and sensitivity to sunlight.  Allergic/Immunologic: Negative for susceptible to infections.  Neurological: Negative for dizziness, memory loss and night sweats.  Hematological: Negative for swollen glands.  Psychiatric/Behavioral: Negative for depressed mood and sleep disturbance. The patient is not nervous/anxious.     PMFS History:  Patient  Active Problem List   Diagnosis Date Noted  . History of total knee replacement, right 08/25/2016  . Family history of rheumatoid arthritis 08/25/2016  . Vitamin D deficiency 08/17/2016  . Skin lesion 07/11/2016  . Positive anti-CCP test 06/05/2016  . Polyarthralgia 05/08/2016  . Weight loss 05/05/2016  . Chest pain 08/16/2015  . Stomach pain  06/18/2015  . Cervical disc disorder with radiculopathy of cervical region 06/08/2014  . Right shoulder pain 04/20/2014  . Subacromial bursitis 01/26/2014  . Complete rotator cuff tear of left shoulder 11/27/2013  . Primary localized osteoarthrosis, lower leg 11/27/2013  . COPD  GOLD II    . Microscopic hematuria   . Cigarette smoker 02/27/2011  . 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 History of malignant neoplasm of large intestine 02/18/2010  . Hyperlipidemia 02/16/2010  . History of myocardial infarction 02/16/2010  . GERD 02/16/2010  . Osteoarthritis 02/16/2010  . Essential hypertension 02/14/2010  . Takotsubo syndrome 02/12/2009    Past Medical History:  Diagnosis Date  . ADENOCARCINOMA, COLON, HX OF 03/2009  . ANEMIA   . BACK PAIN, LUMBAR   . Cancer [facility]) 2010   colon cancer  . CHF (congestive heart failure) ([place])   . COPD (chronic obstructive pulmonary disease) with emphysema ([place])   . Crohn's  02/2010   ileal ulcers, intol of entercort--refuses treatment  . GERD   . History of transfusion of whole blood   . HYPERLIPIDEMIA   . HYPERTENSION   . Microscopic hematuria    chronic  . Obstruction of intestine or colon (HCC)    adhesions  . OSTEOARTHRITIS   . Rectal fissure    Possible fissure  . Rotator cuff tear   . Takotsubo syndrome 12/2008  . URINARY INCONTINENCE     Family History  Problem Relation Age of Onset  . Colon cancer Father 42  . Hypertension Father   . Heart disease Father   . Kidney disease Father   . Colon cancer Sister 40  . Throat cancer Mother   . Arthritis Other        Parent, other relative   Past Surgical History:  Procedure Laterality Date  . ABDOMINAL HYSTERECTOMY  1994  . APPENDECTOMY  1964  . BREAST SURGERY    . CESAREAN SECTION     x 3  . CHOLECYSTECTOMY  1995  . COLON SURGERY  03/2009   hemicolectomy colon cancer  . COLONOSCOPY W/ BIOPSIES AND POLYPECTOMY  01/24/2011    (Crohn's)ileitis, internal hemorrhoids  . HAND SURGERY  1991  . KNEE SURGERY Right 1981  . right knee replacement  03/2010  . TONSILLECTOMY  1952  . UPPER GASTROINTESTINAL ENDOSCOPY  05/16/2010   normal  . VIDEO BRONCHOSCOPY Bilateral 12/18/2014   Procedure: VIDEO BRONCHOSCOPY WITHOUT FLUORO;  Surgeon: Tanda Rockers, MD;  Location: [facility];  Service: Cardiopulmonary;  Laterality: Bilateral;   Social History   Social History Narrative   Lives with husband and 2 grand dtr; 9 and 16        Objective: Vital Signs: BP 138/72   Pulse 78   Ht 5' 2"  (1.575 m)   Wt 129 lb (58.5 kg)   BMI 23.59 kg/m    Physical Exam  Constitutional: She is oriented to person, place, and time. She appears well-developed and well-nourished.  HENT:  Head: Normocephalic and atraumatic.  Eyes: Conjunctivae and EOM are normal.  Neck: Normal range of motion.  Cardiovascular: Normal rate, regular  rhythm, normal heart sounds and intact distal pulses.   Pulmonary/Chest: Effort normal and breath sounds normal.  Abdominal: Soft. Bowel sounds are normal.  Lymphadenopathy:    She has no cervical adenopathy.  Neurological: She is alert and oriented to person, place, and time.  Skin: Skin is warm and dry. Capillary refill takes less than 2 seconds.  Psychiatric: She has a normal mood and affect. Her behavior is normal.  Nursing note and vitals reviewed.    Musculoskeletal Exam: C-spine limited range of motion especially with right lateral rotation. Thoracic and lumbar spine good range of motion. Shoulder joints abduction is limited to 90 on the left and 110 on the right. Elbow joints wrist joints are good range of motion. She had no synovitis of her wrist joint or MCP joints. She has PIP/DIP thickening bilaterally consistent with osteoarthritis. She painful limited range of motion of her hip joints. Her right total knee replacement appears to be doing well with some warmth. She is painful range of motion of  her left knee joint without any warmth swelling or effusion. She is some thickening of bilateral PIP/DIP joints in her feet with no synovitis.  CDAI Exam: CDAI Homunculus Exam:   Tenderness:  RUE: glenohumeral LUE: glenohumeral LLE: tibiofemoral  Joint Counts:  CDAI Tender Joint count: 3 CDAI Swollen Joint count: 0  Global Assessments:  Patient Global Assessment: 6 Provider Global Assessment: 6  CDAI Calculated Score: 15    Investigation: Findings:  Labs Feb 2018  CCP elevated 57, ACE normal  31, RF Negative, ANA negative, CRP 0.1, Sed Rate 16 , Vitamin D 23, CBC normal, CMP normal, TSH normal.     Imaging: Xr Hips Bilat W Or W/o Pelvis 3-4 Views  Result Date: 08/25/2016 Bilateral mild inferior medial joint narrowing. SI joints appear normal.  Xr Hand 2 View Left  Result Date: 08/25/2016 PIP/DIP narrowing and CMC narrowing was noted. No MCP joint narrowing or erosive changes were noted. No intercarpal joint space narrowing or erosive changes noted. Impression: These findings are consistent with osteoarthritis  Xr Hand 2 View Right  Result Date: 08/25/2016 PIP/DIP narrowing and CMC narrowing was noted. No MCP joint narrowing or erosive changes were noted. No intercarpal joint space narrowing or erosive changes noted. Impression: These findings are consistent with osteoarthritis  Xr Knee 3 View Left  Result Date: 08/25/2016 Moderate medial compartment narrowing was noted. Chondrocalcinosis was noted. Moderate patellofemoral narrowing was noted. Impression: Findings are consistent with moderate osteoarthritis, chondromalacia patella and chondrocalcinosis   Speciality Comments: No specialty comments available.    Procedures:  No procedures performed Allergies: Morphine   Assessment / Plan:     Visit Diagnoses: Polyarthralgia: Patient had multiple arthralgias for several years. She had no synovitis on examination today. She does have PIP/DIP thickening in her hands  and feet consistent with osteoarthritis.  Positive anti-CCP test - RF negative, ANA negative, ace negative, ESR 16. Had detailed discussion about positive CCP in association with autoimmune disease. It can be seen with rheumatoid arthritis and also can be seen with Crohn's disease.  Crohn's disease of both small and large intestine without complication ([place]) - Dxd 2011. Patient reports that she still has frequent diarrhea without any blood or mucus and she's not taking any treatment for Crohn's disease.  History of malignant neoplasm of large intestine - Colon Ca 03/2009 s/p right hemicolectomy  Complete rotator cuff tear of left shoulder: Chronic pain  Chronic right shoulder pain - subacromial bursitis. She still has  nocturnal pain due to that  Pain bilateral hip joints the x-ray today revealed mild osteoarthritic changes.  History of total knee replacement, right  Osteoarthritis left knee joint. Patient reports that she does have chronic pain and was offered total knee replacement but she did not want surgery. The x-ray revealed moderate osteoarthritis, chondral malacia patella and chondrocalcinosis. She could be having intermittent discomfort due to chondral calcinosis flares.  In bilateral hands: Patient had no synovitis in x-rays today revealed osteoarthritic changes only.  Cervical disc disorder with radiculopathy of cervical region: She has limited range of motion  This disease of lumbar spine: Patient had cortisone injections in the past. She was also offered surgery but she declined.  Family history of rheumatoid arthritis in her mother.  Vitamin D deficiency: She is on vitamin D supplement  History of myocardial infarction  Takotsubo syndrome - Cardiomyopathy  Smoker: Smoking cessation discussed.  COPD    Essential hypertension    Orders: Orders Placed This Encounter  Procedures  . XR Hand 2 View Left  . XR Hand 2 View Right  . XR KNEE 3 VIEW LEFT  . XR HIPS  BILAT W OR W/O PELVIS 3-4 VIEWS   No orders of the defined types were placed in this encounter.   Face-to-face time spent with patient was 30 minutes. 50% of time was spent in counseling and coordination of care.  Follow-Up Instructions: Return for Polyarthralgia.   Bo Merino, MD  Note - This record has been created using Editor, commissioning.  Chart creation errors have been sought, but may not always  have been located. Such creation errors do not reflect on  the standard of medical care.

## 2016-08-25 ENCOUNTER — Ambulatory Visit (INDEPENDENT_AMBULATORY_CARE_PROVIDER_SITE_OTHER): Payer: Self-pay

## 2016-08-25 ENCOUNTER — Ambulatory Visit (INDEPENDENT_AMBULATORY_CARE_PROVIDER_SITE_OTHER): Payer: Medicare Other | Admitting: Rheumatology

## 2016-08-25 ENCOUNTER — Ambulatory Visit (INDEPENDENT_AMBULATORY_CARE_PROVIDER_SITE_OTHER): Payer: Medicare Other

## 2016-08-25 ENCOUNTER — Encounter: Payer: Self-pay | Admitting: Rheumatology

## 2016-08-25 VITALS — BP 138/72 | HR 78 | Ht 62.0 in | Wt 129.0 lb

## 2016-08-25 DIAGNOSIS — M1712 Unilateral primary osteoarthritis, left knee: Secondary | ICD-10-CM | POA: Diagnosis not present

## 2016-08-25 DIAGNOSIS — M47816 Spondylosis without myelopathy or radiculopathy, lumbar region: Secondary | ICD-10-CM | POA: Diagnosis not present

## 2016-08-25 DIAGNOSIS — J449 Chronic obstructive pulmonary disease, unspecified: Secondary | ICD-10-CM

## 2016-08-25 DIAGNOSIS — M25552 Pain in left hip: Secondary | ICD-10-CM

## 2016-08-25 DIAGNOSIS — M79641 Pain in right hand: Secondary | ICD-10-CM

## 2016-08-25 DIAGNOSIS — K5080 Crohn's disease of both small and large intestine without complications: Secondary | ICD-10-CM

## 2016-08-25 DIAGNOSIS — M25562 Pain in left knee: Secondary | ICD-10-CM | POA: Diagnosis not present

## 2016-08-25 DIAGNOSIS — G8929 Other chronic pain: Secondary | ICD-10-CM

## 2016-08-25 DIAGNOSIS — Z96651 Presence of right artificial knee joint: Secondary | ICD-10-CM | POA: Insufficient documentation

## 2016-08-25 DIAGNOSIS — M75122 Complete rotator cuff tear or rupture of left shoulder, not specified as traumatic: Secondary | ICD-10-CM

## 2016-08-25 DIAGNOSIS — I10 Essential (primary) hypertension: Secondary | ICD-10-CM

## 2016-08-25 DIAGNOSIS — I252 Old myocardial infarction: Secondary | ICD-10-CM

## 2016-08-25 DIAGNOSIS — M25551 Pain in right hip: Secondary | ICD-10-CM

## 2016-08-25 DIAGNOSIS — E559 Vitamin D deficiency, unspecified: Secondary | ICD-10-CM | POA: Diagnosis not present

## 2016-08-25 DIAGNOSIS — R768 Other specified abnormal immunological findings in serum: Secondary | ICD-10-CM

## 2016-08-25 DIAGNOSIS — I5181 Takotsubo syndrome: Secondary | ICD-10-CM

## 2016-08-25 DIAGNOSIS — F17200 Nicotine dependence, unspecified, uncomplicated: Secondary | ICD-10-CM

## 2016-08-25 DIAGNOSIS — M25511 Pain in right shoulder: Secondary | ICD-10-CM | POA: Diagnosis not present

## 2016-08-25 DIAGNOSIS — M1120 Other chondrocalcinosis, unspecified site: Secondary | ICD-10-CM | POA: Insufficient documentation

## 2016-08-25 DIAGNOSIS — Z85038 Personal history of other malignant neoplasm of large intestine: Secondary | ICD-10-CM

## 2016-08-25 DIAGNOSIS — Z8261 Family history of arthritis: Secondary | ICD-10-CM | POA: Diagnosis not present

## 2016-08-25 DIAGNOSIS — M79642 Pain in left hand: Secondary | ICD-10-CM

## 2016-08-25 DIAGNOSIS — M2550 Pain in unspecified joint: Secondary | ICD-10-CM

## 2016-08-25 DIAGNOSIS — M5010 Cervical disc disorder with radiculopathy, unspecified cervical region: Secondary | ICD-10-CM

## 2016-08-29 NOTE — Progress Notes (Signed)
[name] [place],  [address] Phone: [phone] Subjective:    I'm seeing this patient by the request  of:    CC: Right shoulder pain  GBT:[id number]  Erica [name] is a 66 y.o. female coming in with complaint of right shoulder pain. Has had this previously. Has had an chondrocalcinosis as well as a complete tear of the rotator cuff of the left shoulder previously. Also found to have cervical radiculopathy. Patient statesStill having neck pain. Seems to be having bilateral shoulder pain. Describes pain as a dull, throbbing aching sensation.   regarding patient's neck patient was found to have severe arthritic and facet changes C5-6. Patient did have an epidural done back in May 2016. Patient did respond to this previously.  Patient was found to have a positive anti-CCP test. Patient did see rheumatology for the polyarthralgia and was diagnosed more with the Crohn's disease likely given falls positive. Patient continues to have a dull, throbbing aching pain all over. Past Medical History:  Diagnosis Date  . ADENOCARCINOMA, COLON, HX OF 03/2009  . ANEMIA   . BACK PAIN, LUMBAR   . Cancer [facility]) 2010   colon cancer  . CHF (congestive heart failure) ([place])   . COPD (chronic obstructive pulmonary disease) with emphysema ([place])   . Crohn's  02/2010   ileal ulcers, intol of entercort--refuses treatment  . GERD   . History of transfusion of whole blood   . HYPERLIPIDEMIA   . HYPERTENSION   . Microscopic hematuria    chronic  . Obstruction of intestine or colon (HCC)    adhesions  . OSTEOARTHRITIS   . Rectal fissure    Possible fissure  . Rotator cuff tear   . Takotsubo syndrome 12/2008  . URINARY INCONTINENCE    Past Surgical History:  Procedure Laterality Date  . ABDOMINAL HYSTERECTOMY  1994  . APPENDECTOMY  1964  . BREAST SURGERY    . CESAREAN SECTION     x 3  . CHOLECYSTECTOMY  1995  . COLON SURGERY  03/2009   hemicolectomy colon cancer  . COLONOSCOPY W/ BIOPSIES AND POLYPECTOMY  01/24/2011   (Crohn's)ileitis, internal hemorrhoids  . HAND SURGERY  1991  . KNEE SURGERY Right 1981  . right knee replacement  03/2010  . TONSILLECTOMY  1952  . UPPER GASTROINTESTINAL ENDOSCOPY  05/16/2010   normal  . VIDEO BRONCHOSCOPY Bilateral 12/18/2014   Procedure: VIDEO BRONCHOSCOPY WITHOUT FLUORO;  Surgeon: Tanda Rockers, MD;  Location: [facility];  Service: Cardiopulmonary;  Laterality: Bilateral;   Social History   Social History  . Marital status: Married    Spouse name: N/A  . Number of children: N/A  . Years of education: N/A   Occupational History  . Retired  Unemployed   Social History Main Topics  . Smoking status: Current Every Day Smoker    Packs/day: 0.50    Years: 48.00    Types: Cigarettes  . Smokeless tobacco: Never Used  . Alcohol use No  . Drug use: No  . Sexual activity: Not Asked   Other Topics Concern  . None   Social History Narrative   Lives with husband and 2 grand dtr; 9 and 16      Allergies  Allergen Reactions  . Morphine Nausea And Vomiting   Family History  Problem Relation Age of Onset  . Colon cancer Father 82  . Hypertension Father   . Heart disease Father   . Kidney disease  Father   . Colon cancer Sister 69  . Throat cancer Mother   . Arthritis Other        Parent, other relative    Past medical history, social, surgical and family history all reviewed in electronic medical record.  No pertanent information unless stated regarding to the chief complaint.   Review of Systems: No , visual changes, nausea, vomiting, diarrhea, constipation, dizziness, abdominal pain, skin rash, fevers, chills, night sweats, weight loss, swollen lymph nodes, chest pain, shortness of breath, mood changes.  Positive headaches, body aches, joint swelling, muscle aches  Objective  Blood pressure 110/70, pulse 98, weight 122 lb (55.3 kg).   Systems examined below as of  08/30/16 General: NAD A&O x3 mood, affect normal  HEENT: Pupils equal, extraocular movements intact no nystagmus Respiratory: not short of breath at rest or with speaking Cardiovascular: No lower extremity edema, non tender Skin: Warm dry intact with no signs of infection or rash on extremities or on axial skeleton. Abdomen: Soft nontender, no masses Neuro: Cranial nerves  intact, neurovascularly intact in all extremities with 2+ DTRs and 2+ pulses. Lymph: No lymphadenopathy appreciated today  Gait normal with good balance and coordination.  MSK: tender with full range of motion and good stability and symmetric strength and tone of elbows, wrist,  knee hips and ankles bilaterally.  Arthritic changes of multiple joints Shoulder: Bilateral Inspection reveals mild atrophy of the musculature Palpation is normal with no tenderness over AC joint or bicipital groove.  ROM is full in all planes. Rotator cuff strength normal throughout. Positive impingement Speeds and Yergason's tests normal. Positive O'Brien's. Normal scapular function observed. No painful arc and no drop arm sign. No apprehension sign  Neck: Inspection loss of lordosis. No palpable stepoffs. Positive Spurling's maneuver. Mild limitation in range of motion lacking the last 5-10 in all planes Grip strength and sensation normal in bilateral hands Strength good C4 to T1 distribution No sensory change to C4 to T1 Negative Hoffman sign bilaterally Reflexes normal    Procedure: Real-time Ultrasound Guided Injection of right glenohumeral joint Device: GE Logiq Q7  Ultrasound guided injection is preferred based studies that show increased duration, increased effect, greater accuracy, decreased procedural pain, increased response rate with ultrasound guided versus blind injection.  Verbal informed consent obtained.  Time-out conducted.  Noted no overlying erythema, induration, or other signs of local infection.  Skin prepped  in a sterile fashion.  Local anesthesia: Topical Ethyl chloride.  With sterile technique and under real time ultrasound guidance:  Joint visualized.  23g 1  inch needle inserted posterior approach. Pictures taken for needle placement. Patient did have injection of 2 cc of 1% lidocaine, 2 cc of 0.5% Marcaine, and 1.0 cc of Kenalog 40 mg/dL. Completed without difficulty  Pain immediately resolved suggesting accurate placement of the medication.  Advised to call if fevers/chills, erythema, induration, drainage, or persistent bleeding.  Images permanently stored and available for review in the ultrasound unit.  Impression: Technically successful ultrasound guided injection.  Procedure: Real-time Ultrasound Guided Injection of left glenohumeral joint Device: GE Logiq E  Ultrasound guided injection is preferred based studies that show increased duration, increased effect, greater accuracy, decreased procedural pain, increased response rate with ultrasound guided versus blind injection.  Verbal informed consent obtained.  Time-out conducted.  Noted no overlying erythema, induration, or other signs of local infection.  Skin prepped in a sterile fashion.  Local anesthesia: Topical Ethyl chloride.  With sterile technique and under real time ultrasound  guidance:  Joint visualized.  21g 2 inch needle inserted posterior approach. Pictures taken for needle placement. Patient did have injection of 2 cc of 0.5% Marcaine, and 1cc of Kenalog 40 mg/dL. Completed without difficulty  Pain immediately resolved suggesting accurate placement of the medication.  Advised to call if fevers/chills, erythema, induration, drainage, or persistent bleeding.  Images permanently stored and available for review in the ultrasound unit.  Impression: Technically successful ultrasound guided injection.    Impression and Recommendations:     This case required medical decision making of moderate complexity.      Note:  This dictation was prepared with Dragon dictation along with smaller phrase technology. Any transcriptional errors that result from this process are unintentional.

## 2016-08-30 ENCOUNTER — Other Ambulatory Visit: Payer: Self-pay

## 2016-08-30 ENCOUNTER — Encounter: Payer: Self-pay | Admitting: Family Medicine

## 2016-08-30 ENCOUNTER — Ambulatory Visit: Payer: Self-pay

## 2016-08-30 ENCOUNTER — Ambulatory Visit (INDEPENDENT_AMBULATORY_CARE_PROVIDER_SITE_OTHER): Payer: Medicare Other | Admitting: Family Medicine

## 2016-08-30 VITALS — BP 110/70 | HR 98 | Wt 122.0 lb

## 2016-08-30 DIAGNOSIS — G8929 Other chronic pain: Secondary | ICD-10-CM

## 2016-08-30 DIAGNOSIS — M25511 Pain in right shoulder: Secondary | ICD-10-CM

## 2016-08-30 DIAGNOSIS — E559 Vitamin D deficiency, unspecified: Secondary | ICD-10-CM

## 2016-08-30 DIAGNOSIS — R768 Other specified abnormal immunological findings in serum: Secondary | ICD-10-CM

## 2016-08-30 DIAGNOSIS — M25512 Pain in left shoulder: Secondary | ICD-10-CM | POA: Diagnosis not present

## 2016-08-30 DIAGNOSIS — M12811 Other specific arthropathies, not elsewhere classified, right shoulder: Secondary | ICD-10-CM | POA: Diagnosis not present

## 2016-08-30 DIAGNOSIS — M5010 Cervical disc disorder with radiculopathy, unspecified cervical region: Secondary | ICD-10-CM | POA: Diagnosis not present

## 2016-08-30 DIAGNOSIS — M12812 Other specific arthropathies, not elsewhere classified, left shoulder: Secondary | ICD-10-CM

## 2016-08-30 DIAGNOSIS — M75102 Unspecified rotator cuff tear or rupture of left shoulder, not specified as traumatic: Secondary | ICD-10-CM

## 2016-08-30 DIAGNOSIS — M75101 Unspecified rotator cuff tear or rupture of right shoulder, not specified as traumatic: Secondary | ICD-10-CM | POA: Insufficient documentation

## 2016-08-30 MED ORDER — TIZANIDINE HCL 4 MG PO TABS: 4.0000 mg | ORAL_TABLET | Freq: Every evening | ORAL | 2 refills | Status: AC | PRN

## 2016-08-30 MED ORDER — VITAMIN D (ERGOCALCIFEROL) 1.25 MG (50000 UNIT) PO CAPS: 50000.0000 [IU] | ORAL_CAPSULE | ORAL | 0 refills | Status: DC

## 2016-08-30 NOTE — Patient Instructions (Signed)
Good to see you  Erica [name] is your friend.  Get a probitoic with at least 10 different strains and at least 10 billion CFU pennsaid pinkie amount topically 2 times daily as needed.  Try duexis 3 times a day for 3 days but do not use all the time Try the zanaflex at night If not better in a week write me and lets consider epidural again  See me again in 6 weeks or can repeat injections every 10 weeks.

## 2016-08-30 NOTE — Assessment & Plan Note (Signed)
Bilateral injections given today. Tolerated the procedure well. We discussed icing regimen and home exercises. We discussed which activities doing which ones to avoid. Encourage patient to increase activity as tolerated. I do believe that there is some cervical pathology is likely contributing and patient would need another epidural. Patient is in agreement with plan. Follow-up in 4 weeks otherwise.

## 2016-08-30 NOTE — Assessment & Plan Note (Signed)
Refilled once weekly vitamin D. Patient has had difficulty with reabsorption since the colonectomy.

## 2016-08-30 NOTE — Assessment & Plan Note (Signed)
Appreciate rheumatology simple.

## 2016-08-30 NOTE — Assessment & Plan Note (Signed)
I do believe that this is likely contributing to some of the pain as well. Did respond fairly well to epidurals in 2016. May need to repeat in the future.

## 2016-09-06 ENCOUNTER — Other Ambulatory Visit: Payer: Self-pay | Admitting: Acute Care

## 2016-09-06 ENCOUNTER — Ambulatory Visit (INDEPENDENT_AMBULATORY_CARE_PROVIDER_SITE_OTHER): Payer: Medicare Other | Admitting: Acute Care

## 2016-09-06 ENCOUNTER — Encounter: Payer: Self-pay | Admitting: Acute Care

## 2016-09-06 ENCOUNTER — Ambulatory Visit (INDEPENDENT_AMBULATORY_CARE_PROVIDER_SITE_OTHER)
Admission: RE | Admit: 2016-09-06 | Discharge: 2016-09-06 | Disposition: A | Discharge status: Home / Self Care | Payer: Medicare Other | Source: Ambulatory Visit | Attending: Acute Care | Admitting: Acute Care

## 2016-09-06 DIAGNOSIS — F17210 Nicotine dependence, cigarettes, uncomplicated: Secondary | ICD-10-CM

## 2016-09-06 DIAGNOSIS — Z87891 Personal history of nicotine dependence: Secondary | ICD-10-CM | POA: Diagnosis not present

## 2016-09-06 NOTE — Progress Notes (Signed)
Shared Decision Making Visit Lung Cancer Screening Program [phone])   Eligibility:  Age 66 y.o.  Pack Years Smoking History Calculation 50-pack-year smoking history (# packs/per year x # years smoked)  Recent History of coughing up blood  no  Unexplained weight loss? no ( >Than 15 pounds within the last 6 months )  Prior History Lung / other cancer no (Diagnosis within the last 5 years already requiring surveillance chest CT Scans).  Smoking Status Current Smoker  Former Smokers: Years since quit:NA  Quit Date: NA  Visit Components:  Discussion included one or more decision making aids. yes  Discussion included risk/benefits of screening. yes  Discussion included potential follow up diagnostic testing for abnormal scans. yes  Discussion included meaning and risk of over diagnosis. yes  Discussion included meaning and risk of False Positives. yes  Discussion included meaning of total radiation exposure. yes  Counseling Included:  Importance of adherence to annual lung cancer LDCT screening. yes  Impact of comorbidities on ability to participate in the program. yes  Ability and willingness to under diagnostic treatment. yes  Smoking Cessation Counseling:  Current Smokers:   Discussed importance of smoking cessation. yes  Information about tobacco cessation classes and interventions provided to patient. yes  Patient provided with "ticket" for LDCT Scan. yes  Symptomatic Patient. no  Counseling  Diagnosis Code: Tobacco Use Z72.0  Asymptomatic Patient yes  Counseling (Intermediate counseling: > three minutes counseling) O8786  Former Smokers:   Discussed the importance of maintaining cigarette abstinence. yes  Diagnosis Code: Personal History of Nicotine Dependence. V67.209  Information about tobacco cessation classes and interventions provided to patient. Yes  Patient provided with "ticket" for LDCT Scan. yes  Written Order for Lung Cancer  Screening with LDCT placed in Epic. Yes (CT Chest Lung Cancer Screening Low Dose W/O CM) [id number] Z12.2-Screening of respiratory organs Z87.891-Personal history of nicotine dependence  I have spent 25 minutes of face to face time with Ms. Pitstick discussing the risks and benefits of lung cancer screening. We viewed a power point together that explained in detail the above noted topics. We paused at intervals to allow for questions to be asked and answered to ensure understanding.We discussed that the single most powerful action that she can take to decrease her risk of developing lung cancer is to quit smoking. We discussed whether or not she is ready to commit to setting a quit date. She is currently not ready to set a quit date. We discussed options for tools to aid in quitting smoking including nicotine replacement therapy, non-nicotine medications, support groups, Quit Smart classes, and behavior modification. We discussed that often times setting smaller, more achievable goals, such as eliminating 1 cigarette a day for a week and then 2 cigarettes a day for a week can be helpful in slowly decreasing the number of cigarettes smoked. This allows for a sense of accomplishment as well as providing a clinical benefit. I gave Mrs. Babiarz the " Be Stronger Than Your Excuses" card with contact information for community resources, classes, free nicotine replacement therapy, and access to mobile apps, text messaging, and on-line smoking cessation help. I have also given her my card and contact information in the event she needs to contact me. We discussed the time and location of the scan, and that either Doroteo Glassman RN or I will call with the results within 24-48 hours of receiving them. I have offered Mrs. Sassaman  a copy of the power point we viewed  as  a resource in the event they need reinforcement of the concepts we discussed today in the office. The patient verbalized understanding of all of  the above and  had no further questions upon leaving the office. They have my contact information in the event they have any further questions.  I spent for minutes counseling on smoking cessation and the health risks of continued tobacco abuse.  I explained to the patient that there has been a high incidence of coronary artery disease noted on these exams. I explained that this is a non-gated exam therefore degree or severity cannot be determined. This patient is not currently on statin therapy. She has however followed by cardiologist. I have asked the patient to follow-up with their PCP regarding any incidental finding of coronary artery disease and management with diet or medication as their PCP  feels is clinically indicated. The patient verbalized understanding of the above and had no further questions upon completion of the visit.      Magdalen Spatz, NP 09/06/2016

## 2016-09-07 ENCOUNTER — Other Ambulatory Visit: Payer: Self-pay | Admitting: Acute Care

## 2016-09-07 DIAGNOSIS — F17210 Nicotine dependence, cigarettes, uncomplicated: Secondary | ICD-10-CM

## 2016-09-07 NOTE — Progress Notes (Signed)
Subjective:    Patient ID: Erica [name], female    DOB: Jan 11, 1951, 66 y.o.   MRN: [id number]  HPI She is here for an acute visit.   She is here for follow up of a recent CT scan that shows a possible infection and for symptoms of bronchitis.  She has seen pulmonary for screening for lung cancer and because of her CT scan findings will be scheduled for a 6 month follow up Ct scan.  She is still smoking. She has some COPD and uses an inhaler as needed. At her baseline she does have some SOB and cough.   She gets bronchitis about twice a year.  She has a lot of fatigue.  She has had chills and some sweats at night. She has lost some weight, but attributed that to stress in her life.  She has rhinorrhea, cough that is productive at times with discolored sputum, shortness of breath that is more than her baseline and wheezing that occurs mostly in the morning and at night.  She has headaches and lightheadedness.  She has not been feeling well for a while, but did not think she had an infection.    Hypertension: She is taking her medication daily. She is compliant with a low sodium diet.   She is active, but not exercising regularly.        CT CHEST LUNG CA SCREEN LOW DOSE W/O CM CLINICAL DATA:  Fifty pack-year smoking history.  Asymptomatic.  EXAM: CT CHEST WITHOUT CONTRAST LOW-DOSE FOR LUNG CANCER SCREENING  TECHNIQUE: Multidetector CT imaging of the chest was performed following the standard protocol without IV contrast.  COMPARISON:  Plain films 07/03/2016. Diagnostic CT of 12/10/2014. No prior screening CT.  FINDINGS: Cardiovascular: Aortic and branch vessel atherosclerosis. Normal heart size, without pericardial effusion. Multivessel coronary artery atherosclerosis.  Mediastinum/Nodes: No mediastinal or definite hilar adenopathy, given limitations of unenhanced CT.  Lungs/Pleura: No pleural fluid. Mild centrilobular emphysema. given differences in technique and  inspiratory effort, similar posterior right middle lobe volume loss with compression or narrowing of subtending bronchi, including on image 175/series 3. Lower lobe predominant bronchial wall thickening.  Pleural-based anterior right upper lobe opacity is new since the prior exam, favored to represent an area of interval scarring. This measures volume derived equivalent diameter 28 mm.  Clustered right lower lobe "Tree-in-bud" nodularity laterally is new since the prior.  Reticulonodular opacity in the inferolateral left lower lobe. The most masslike component measures volume derived equivalent diameter 13.4 mm.  Progressive left lower lobe bronchial wall thickening.  Upper Abdomen: Cholecystectomy. Normal imaged portions of the spleen, pancreas, kidneys, right adrenal gland. Low-density left adrenal thickening and nodularity is similar, including at 2.8 cm on image 60/series 2. Abdominal aortic atherosclerosis.  Musculoskeletal: Moderate thoracic spondylosis.  IMPRESSION: 1. Lung-RADS 3, probably benign findings. Short-term follow-up in 6 months is recommended with repeat low-dose chest CT without contrast (please use the following order, "CT CHEST LCS NODULE FOLLOW-UP W/O CM"). Bibasilar nodular opacities are favored to represent areas of age-indeterminate but interval infection. More masslike components within the left lower and right upper lobe could also represent areas of infection or interval scar. Neoplasm felt less likely. Recommend attention on follow-up. 2. Chronic right middle lobe volume loss with posterior endobronchial compression or narrowing. Chronicity favors a benign etiology. 3.  Coronary artery atherosclerosis. Aortic atherosclerosis.  Electronically Signed   By: Abigail Miyamoto M.D.   On: 09/06/2016 13:20   Medications and allergies reviewed with  patient and updated if appropriate.  Patient Active Problem List   Diagnosis Date Noted  . 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 History of total knee replacement, right 08/25/2016  . Family history of rheumatoid arthritis 08/25/2016  . Chondrocalcinosis 08/25/2016  . Vitamin D deficiency 08/17/2016  . Skin lesion 07/11/2016  . Positive anti-CCP test 06/05/2016  . Polyarthralgia 05/08/2016  . Weight loss 05/05/2016  . Chest pain 08/16/2015  . Stomach pain 06/18/2015  . Cervical disc disorder with radiculopathy of cervical region 06/08/2014  . Right shoulder pain 04/20/2014  . Subacromial bursitis 01/26/2014  . Complete rotator cuff tear of left shoulder 11/27/2013  . Primary localized osteoarthrosis, lower leg 11/27/2013  . COPD  GOLD II    . Microscopic hematuria   . Cigarette smoker 02/27/2011  . 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 History of malignant neoplasm of large intestine 02/18/2010  . Hyperlipidemia 02/16/2010  . History of myocardial infarction 02/16/2010  . GERD 02/16/2010  . Osteoarthritis 02/16/2010  . Essential hypertension 02/14/2010  . Takotsubo syndrome 02/12/2009    Current Outpatient Prescriptions on File Prior to Visit  Medication Sig Dispense Refill  . albuterol (PROVENTIL HFA;VENTOLIN HFA) 108 (90 Base) MCG/ACT inhaler Inhale 2 puffs into the lungs daily as needed for wheezing or shortness of breath (shortness of breath). Reported on 06/18/2015 1 Inhaler 6  . calcium carbonate (CALCIUM 600) 600 MG TABS tablet Take 600 mg by mouth daily with breakfast.    . cyclobenzaprine (FLEXERIL) 10 MG tablet Take 1 tablet (10 mg total) by mouth 3 (three) times daily as needed for muscle spasms. 60 tablet 0  . Ferrous Sulfate (IRON) 325 (65 Fe) MG TABS Take by mouth.    [name] HYDROcodone-acetaminophen (NORCO/VICODIN) 5-325 MG tablet Take 1 tablet by mouth 2 (two) times daily as needed for moderate pain (pain). 60 tablet 0  . metoprolol tartrate (LOPRESSOR) 25 MG tablet TAKE ONE TABLET BY MOUTH TWICE DAILY  180 tablet 1  . tiZANidine (ZANAFLEX) 4 MG tablet Take 1 tablet (4 mg total) by mouth at bedtime as needed for muscle spasms. 30 tablet 2  . vitamin B-12 (CYANOCOBALAMIN) 1000 MCG tablet Take 1,000 mcg by mouth daily.    . Vitamin D, Ergocalciferol, (DRISDOL) 50000 units CAPS capsule Take 1 capsule (50,000 Units total) by mouth every 7 (seven) days. 12 capsule 0   No current facility-administered medications on file prior to visit.     Past Medical History:  Diagnosis Date  . ADENOCARCINOMA, COLON, HX OF 03/2009  . ANEMIA   . BACK PAIN, LUMBAR   . Cancer [facility]) 2010   colon cancer  . CHF (congestive heart failure) ([place])   . COPD (chronic obstructive pulmonary disease) with emphysema ([place])   . Crohn's  02/2010   ileal ulcers, intol of entercort--refuses treatment  . GERD   . History of transfusion of whole blood   . HYPERLIPIDEMIA   . HYPERTENSION   . Microscopic hematuria    chronic  . Obstruction of intestine or colon ([facility])    adhesions  . OSTEOARTHRITIS   . Rectal fissure    Possible fissure  . Rotator cuff tear   . Takotsubo syndrome 12/2008  . URINARY INCONTINENCE     Past Surgical History:  Procedure Laterality Date  . ABDOMINAL HYSTERECTOMY  1994  . APPENDECTOMY  1964  . BREAST SURGERY    . CESAREAN SECTION  x 3  . CHOLECYSTECTOMY  1995  . COLON SURGERY  03/2009   hemicolectomy colon cancer  . COLONOSCOPY W/ BIOPSIES AND POLYPECTOMY  01/24/2011   (Crohn's)ileitis, internal hemorrhoids  . HAND SURGERY  1991  . KNEE SURGERY Right 1981  . right knee replacement  03/2010  . TONSILLECTOMY  1952  . UPPER GASTROINTESTINAL ENDOSCOPY  05/16/2010   normal  . VIDEO BRONCHOSCOPY Bilateral 12/18/2014   Procedure: VIDEO BRONCHOSCOPY WITHOUT FLUORO;  Surgeon: Tanda Rockers, MD;  Location: [facility];  Service: Cardiopulmonary;  Laterality: Bilateral;    Social History   Social History  . Marital status: Married    Spouse name: N/A  . Number of children: N/A  .  Years of education: N/A   Occupational History  . Retired  Unemployed   Social History Main Topics  . Smoking status: Current Every Day Smoker    Packs/day: 1.00    Years: 50.00    Types: Cigarettes  . Smokeless tobacco: Never Used  . Alcohol use No  . Drug use: No  . Sexual activity: Not Asked   Other Topics Concern  . None   Social History Narrative   Lives with husband and 2 grand dtr; 22 and 58       Family History  Problem Relation Age of Onset  . Colon cancer Father 62  . Hypertension Father   . Heart disease Father   . Kidney disease Father   . Colon cancer Sister 22  . Throat cancer Mother   . Arthritis Other        Parent, other relative    Review of Systems  Constitutional: Positive for chills, diaphoresis, fatigue and unexpected weight change.  HENT: Positive for rhinorrhea. Negative for congestion, ear pain, sinus pain and sore throat.   Respiratory: Positive for cough (sometimes discolored), shortness of breath (slighlty more than usual) and wheezing (sometimes - morning and night mostly). Negative for chest tightness.   Cardiovascular: Negative for chest pain.  Neurological: Positive for light-headedness (occ) and headaches (occ).       Objective:   Vitals:   09/08/16 0956  BP: 132/86  Pulse: 79  Resp: 16  Temp: 98.8 F (37.1 C)   Filed Weights   09/08/16 0956  Weight: 125 lb (56.7 kg)   Body mass index is 22.86 kg/m.  Wt Readings from Last 3 Encounters:  09/08/16 125 lb (56.7 kg)  08/30/16 122 lb (55.3 kg)  08/25/16 129 lb (58.5 kg)     Physical Exam GENERAL APPEARANCE: Appears stated age, well appearing, NAD EYES: conjunctiva clear, no icterus HEENT: bilateral tympanic membranes and ear canals normal, oropharynx with no erythema, no thyromegaly, trachea midline, no cervical or supraclavicular lymphadenopathy LUNGS: Clear to auscultation without crackles, mild wheeze and tightness especially with coughing, unlabored breathing, good  air entry bilaterally HEART: Normal S1,S2 without murmurs EXTREMITIES: Without clubbing, cyanosis, or edema      Assessment & Plan:   See Problem List for Assessment and Plan of chronic medical problems.

## 2016-09-08 ENCOUNTER — Ambulatory Visit (INDEPENDENT_AMBULATORY_CARE_PROVIDER_SITE_OTHER): Payer: Medicare Other | Admitting: Internal Medicine

## 2016-09-08 ENCOUNTER — Encounter: Payer: Self-pay | Admitting: Internal Medicine

## 2016-09-08 ENCOUNTER — Ambulatory Visit: Payer: Medicare Other | Admitting: Internal Medicine

## 2016-09-08 DIAGNOSIS — I10 Essential (primary) hypertension: Secondary | ICD-10-CM

## 2016-09-08 DIAGNOSIS — J441 Chronic obstructive pulmonary disease with (acute) exacerbation: Secondary | ICD-10-CM | POA: Diagnosis not present

## 2016-09-08 DIAGNOSIS — J40 Bronchitis, not specified as acute or chronic: Secondary | ICD-10-CM | POA: Insufficient documentation

## 2016-09-08 MED ORDER — HYDROCODONE-ACETAMINOPHEN 5-325 MG PO TABS: 1.0000 | ORAL_TABLET | Freq: Two times a day (BID) | ORAL | 0 refills | Status: DC | PRN

## 2016-09-08 MED ORDER — LEVOFLOXACIN 500 MG PO TABS: 500.0000 mg | ORAL_TABLET | Freq: Every day | ORAL | 0 refills | Status: DC

## 2016-09-08 NOTE — Patient Instructions (Addendum)
Take the antibiotic as prescribed.  Take mucinex and cough medication.    Use the albuterol as needed.

## 2016-09-09 NOTE — Assessment & Plan Note (Signed)
Mild COPD exacerbation Continue proventil prn Will hold off on steroids for now given how mild her symptoms are - will add if there is no improvement over the next few days F/u in 10 days

## 2016-09-09 NOTE — Assessment & Plan Note (Addendum)
Acute on chronic with COPD exacerbation Start levaquin Continue albuterol as needed Start mucinex, can use other otc cold medications F/u in 10 days to ensure resolution encouraged smoking cessation

## 2016-09-09 NOTE — Assessment & Plan Note (Signed)
BP Readings from Last 3 Encounters:  09/08/16 132/86  08/30/16 110/70  08/25/16 138/72    BP well controlled Current regimen effective and well tolerated Continue current medications at current doses

## 2016-09-11 ENCOUNTER — Ambulatory Visit: Payer: Self-pay | Admitting: Rheumatology

## 2016-09-16 NOTE — Progress Notes (Signed)
Subjective:    Patient ID: Erica [name], female    DOB: [birth year], 66 y.o.   MRN: [id number]  HPI The patient is here for follow up of her bronchitis.  I saw her 10 days ago for bacterial bronchitis.  She was experiencing SOB, cough that was productive and wheeze.  She was more fatigued.  These symptoms had been there for a while and a CT scan for lung cancer screening showed a probable infection.  She completed the levaquin.  She is using albuterol as needed.   She is here for follow-up.   She is still coughing up some phlegm but it is much better.  It is a little discolored but mostly clear.   She still has some wheeze and SOB at night, but much better.  She denies chills, sweats.  She is eating more, but is constantly on the move  so she feels her weight is stable secondary to that.  Her energy level is better.     she is using the albuterol inhaler on a daily basis. She has never been on a maintenance inhaler. She was unsure if she needed additional antibiotics because she was still coughing , wheezing and experiencing shortness of breath.   she does have some postnasal drip and runny nose, but denies other cold symptoms.  Medications and allergies reviewed with patient and updated if appropriate.  Patient Active Problem List   Diagnosis Date Noted  . Bronchitis 09/08/2016  . COPD exacerbation ([place]) 09/08/2016  . 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 History of total knee replacement, right 08/25/2016  . Family history of rheumatoid arthritis 08/25/2016  . Chondrocalcinosis 08/25/2016  . Vitamin D deficiency 08/17/2016  . Skin lesion 07/11/2016  . Positive anti-CCP test 06/05/2016  . Polyarthralgia 05/08/2016  . Weight loss 05/05/2016  . Chest pain 08/16/2015  . Stomach pain 06/18/2015  . Cervical disc disorder with radiculopathy of cervical region 06/08/2014  . Right shoulder pain 04/20/2014  . Subacromial bursitis 01/26/2014  . Complete  rotator cuff tear of left shoulder 11/27/2013  . Primary localized osteoarthrosis, lower leg 11/27/2013  . COPD  GOLD II    . Microscopic hematuria   . Cigarette smoker 02/27/2011  . 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 History of malignant neoplasm of large intestine 02/18/2010  . Hyperlipidemia 02/16/2010  . History of myocardial infarction 02/16/2010  . GERD 02/16/2010  . Osteoarthritis 02/16/2010  . Essential hypertension 02/14/2010  . Takotsubo syndrome 02/12/2009    Current Outpatient Prescriptions on File Prior to Visit  Medication Sig Dispense Refill  . albuterol (PROVENTIL HFA;VENTOLIN HFA) 108 (90 Base) MCG/ACT inhaler Inhale 2 puffs into the lungs daily as needed for wheezing or shortness of breath (shortness of breath). Reported on 06/18/2015 1 Inhaler 6  . calcium carbonate (CALCIUM 600) 600 MG TABS tablet Take 600 mg by mouth daily with breakfast.    . cyclobenzaprine (FLEXERIL) 10 MG tablet Take 1 tablet (10 mg total) by mouth 3 (three) times daily as needed for muscle spasms. 60 tablet 0  . Ferrous Sulfate (IRON) 325 (65 Fe) MG TABS Take by mouth.    [name] HYDROcodone-acetaminophen (NORCO/VICODIN) 5-325 MG tablet Take 1 tablet by mouth 2 (two) times daily as needed for moderate pain (pain). 60 tablet 0  . metoprolol tartrate (LOPRESSOR) 25 MG tablet TAKE ONE TABLET BY MOUTH TWICE DAILY 180 tablet 1  . vitamin B-12 (CYANOCOBALAMIN) 1000 MCG  tablet Take 1,000 mcg by mouth daily.    . Vitamin D, Ergocalciferol, (DRISDOL) 50000 units CAPS capsule Take 1 capsule (50,000 Units total) by mouth every 7 (seven) days. 12 capsule 0   No current facility-administered medications on file prior to visit.     Past Medical History:  Diagnosis Date  . ADENOCARCINOMA, COLON, HX OF 03/2009  . ANEMIA   . BACK PAIN, LUMBAR   . Cancer [facility]) 2010   colon cancer  . CHF (congestive heart failure) ([place])   . COPD (chronic obstructive pulmonary  disease) with emphysema ([place])   . Crohn's  02/2010   ileal ulcers, intol of entercort--refuses treatment  . GERD   . History of transfusion of whole blood   . HYPERLIPIDEMIA   . HYPERTENSION   . Microscopic hematuria    chronic  . Obstruction of intestine or colon ([facility])    adhesions  . OSTEOARTHRITIS   . Rectal fissure    Possible fissure  . Rotator cuff tear   . Takotsubo syndrome 12/2008  . URINARY INCONTINENCE     Past Surgical History:  Procedure Laterality Date  . ABDOMINAL HYSTERECTOMY  1994  . APPENDECTOMY  1964  . BREAST SURGERY    . CESAREAN SECTION     x 3  . CHOLECYSTECTOMY  1995  . COLON SURGERY  03/2009   hemicolectomy colon cancer  . COLONOSCOPY W/ BIOPSIES AND POLYPECTOMY  01/24/2011   (Crohn's)ileitis, internal hemorrhoids  . HAND SURGERY  1991  . KNEE SURGERY Right 1981  . right knee replacement  03/2010  . TONSILLECTOMY  1952  . UPPER GASTROINTESTINAL ENDOSCOPY  05/16/2010   normal  . VIDEO BRONCHOSCOPY Bilateral 12/18/2014   Procedure: VIDEO BRONCHOSCOPY WITHOUT FLUORO;  Surgeon: Tanda Rockers, MD;  Location: [facility];  Service: Cardiopulmonary;  Laterality: Bilateral;    Social History   Social History  . Marital status: Married    Spouse name: N/A  . Number of children: N/A  . Years of education: N/A   Occupational History  . Retired  Unemployed   Social History Main Topics  . Smoking status: Current Every Day Smoker    Packs/day: 1.00    Years: 50.00    Types: Cigarettes  . Smokeless tobacco: Never Used  . Alcohol use No  . Drug use: No  . Sexual activity: Not on file   Other Topics Concern  . Not on file   Social History Narrative   Lives with husband and 2 grand dtr; 67 and 56       Family History  Problem Relation Age of Onset  . Colon cancer Father 67  . Hypertension Father   . Heart disease Father   . Kidney disease Father   . Colon cancer Sister 56  . Throat cancer Mother   . Arthritis Other        Parent,  other relative    Review of Systems  Constitutional: Positive for fatigue. Negative for chills, diaphoresis and fever.  HENT: Positive for postnasal drip and rhinorrhea. Negative for congestion, sinus pressure and sore throat.   Respiratory: Positive for cough (better), shortness of breath and wheezing.   Neurological: Negative for light-headedness and headaches.       Objective:   Vitals:   09/18/16 1354  BP: 134/84  Pulse: 64  Resp: 16  Temp: 98.9 F (37.2 C)   Wt Readings from Last 3 Encounters:  09/18/16 124 lb (56.2 kg)  09/08/16 125 lb (56.7 kg)  08/30/16 122 lb (55.3 kg)   Body mass index is 22.68 kg/m.   Physical Exam    GENERAL APPEARANCE: Appears stated age, well appearing, NAD EYES: conjunctiva clear, no icterus HEENT: bilateral tympanic membranes and ear canals normal, oropharynx with no erythema, no thyromegaly, trachea midline, no cervical or supraclavicular lymphadenopathy LUNGS: Clear to auscultation without wheeze or crackles, unlabored breathing, good air entry bilaterally HEART: Normal H2,U5 1/6 systolic murmurs EXTREMITIES: Without clubbing, cyanosis, or edema     Assessment & Plan:    See Problem List for Assessment and Plan of chronic medical problems.

## 2016-09-18 ENCOUNTER — Telehealth: Payer: Self-pay | Admitting: *Deleted

## 2016-09-18 ENCOUNTER — Encounter: Payer: Self-pay | Admitting: Internal Medicine

## 2016-09-18 ENCOUNTER — Ambulatory Visit (INDEPENDENT_AMBULATORY_CARE_PROVIDER_SITE_OTHER): Payer: Medicare Other | Admitting: Internal Medicine

## 2016-09-18 VITALS — BP 134/84 | HR 64 | Temp 98.9°F | Resp 16 | Wt 124.0 lb

## 2016-09-18 DIAGNOSIS — J40 Bronchitis, not specified as acute or chronic: Secondary | ICD-10-CM | POA: Diagnosis not present

## 2016-09-18 DIAGNOSIS — J441 Chronic obstructive pulmonary disease with (acute) exacerbation: Secondary | ICD-10-CM

## 2016-09-18 DIAGNOSIS — Z23 Encounter for immunization: Secondary | ICD-10-CM | POA: Diagnosis not present

## 2016-09-18 MED ORDER — BUDESONIDE-FORMOTEROL FUMARATE 160-4.5 MCG/ACT IN AERO: 2.0000 | INHALATION_SPRAY | Freq: Two times a day (BID) | RESPIRATORY_TRACT | 3 refills | Status: DC

## 2016-09-18 NOTE — Telephone Encounter (Signed)
Notified pharmacist w/MD response...Erica [name]

## 2016-09-18 NOTE — Telephone Encounter (Signed)
Chronic pain - let me know if they need a new script

## 2016-09-18 NOTE — Patient Instructions (Addendum)
  All other Health Maintenance issues reviewed.   All recommended immunizations and age-appropriate screenings are up-to-date or discussed.  Pneumovax immunization administered today.   Medications reviewed and updated.  Changes include trying symbicort. A sample was given. Use twice daily.  Rinse your mouth after use.

## 2016-09-18 NOTE — Assessment & Plan Note (Addendum)
Much improved after 10 days of Levaquin  Still having symptoms , but I do not think she needs additional antibiotics-mos recent symptoms are related to her COPD and she may have some chronic bronchitis   continue albuterol as needed Trial of Symbicort 160-4.5 -sample given and advised her to use twice daily for now -she may be able to taper off as her symptoms improve may not need it daily  followup with [place] or pulmonary if  she does not experience continued improvement in her smptom

## 2016-09-18 NOTE — Telephone Encounter (Signed)
Pharmacist left msg on triage stating due to changing in guidelines with controlled medications need to know the Dx and if hydrocodone is for acute pain ort chronic pain. Rx was written by Dr. Quay Burow on 6/8  Pls advise...Erica [name]

## 2016-09-18 NOTE — Assessment & Plan Note (Addendum)
Still wheezing, coughing and having shortness of breath-all much improved after the antibiotic   lungs sound relatively clear  - no need for oral steroids Stressed smoking cessation  start Symbicort twice daily-sample given  Continue albuterol as needed  She will call if her symptoms do not continue to improve  Follow up with pulmonary as needed

## 2016-09-22 NOTE — Progress Notes (Deleted)
Office Visit Note  Patient: Erica [name]             Date of Birth: 1951/04/03           MRN: [id number]             PCP: [name], MD Referring: [name], * Visit Date: 09/26/2016 Occupation: @GUAROCC @    Subjective:  No chief complaint on file.   History of Present Illness: Erica [name] is a 66 y.o. female ***   Activities of Daily Living:  Patient reports morning stiffness for *** {minute/hour:19697}.   Patient {ACTIONS;DENIES/REPORTS:21021675::"Denies"} nocturnal pain.  Difficulty dressing/grooming: {ACTIONS;DENIES/REPORTS:21021675::"Denies"} Difficulty climbing stairs: {ACTIONS;DENIES/REPORTS:21021675::"Denies"} Difficulty getting out of chair: {ACTIONS;DENIES/REPORTS:21021675::"Denies"} Difficulty using hands for taps, buttons, cutlery, and/or writing: {ACTIONS;DENIES/REPORTS:21021675::"Denies"}   No Rheumatology ROS completed.   PMFS History:  Patient Active Problem List   Diagnosis Date Noted  . Bronchitis 09/08/2016  . COPD exacerbation ([place]) 09/08/2016  . 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 History of total knee replacement, right 08/25/2016  . Family history of rheumatoid arthritis 08/25/2016  . Chondrocalcinosis 08/25/2016  . Vitamin D deficiency 08/17/2016  . Skin lesion 07/11/2016  . Positive anti-CCP test 06/05/2016  . Polyarthralgia 05/08/2016  . Weight loss 05/05/2016  . Chest pain 08/16/2015  . Stomach pain 06/18/2015  . Cervical disc disorder with radiculopathy of cervical region 06/08/2014  . Right shoulder pain 04/20/2014  . Subacromial bursitis 01/26/2014  . Complete rotator cuff tear of left shoulder 11/27/2013  . Primary localized osteoarthrosis, lower leg 11/27/2013  . COPD  GOLD II    . Microscopic hematuria   . Cigarette smoker 02/27/2011  . 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 History of malignant neoplasm of large intestine  02/18/2010  . Hyperlipidemia 02/16/2010  . History of myocardial infarction 02/16/2010  . GERD 02/16/2010  . Osteoarthritis 02/16/2010  . Essential hypertension 02/14/2010  . Takotsubo syndrome 02/12/2009    Past Medical History:  Diagnosis Date  . ADENOCARCINOMA, COLON, HX OF 03/2009  . ANEMIA   . BACK PAIN, LUMBAR   . Cancer [facility]) 2010   colon cancer  . CHF (congestive heart failure) ([place])   . COPD (chronic obstructive pulmonary disease) with emphysema ([place])   . Crohn's  02/2010   ileal ulcers, intol of entercort--refuses treatment  . GERD   . History of transfusion of whole blood   . HYPERLIPIDEMIA   . HYPERTENSION   . Microscopic hematuria    chronic  . Obstruction of intestine or colon ([facility])    adhesions  . OSTEOARTHRITIS   . Rectal fissure    Possible fissure  . Rotator cuff tear   . Takotsubo syndrome 12/2008  . URINARY INCONTINENCE     Family History  Problem Relation Age of Onset  . Colon cancer Father 70  . Hypertension Father   . Heart disease Father   . Kidney disease Father   . Colon cancer Sister 50  . Throat cancer Mother   . Arthritis Other        Parent, other relative   Past Surgical History:  Procedure Laterality Date  . ABDOMINAL HYSTERECTOMY  1994  . APPENDECTOMY  1964  . BREAST SURGERY    . CESAREAN SECTION     x 3  . CHOLECYSTECTOMY  1995  . COLON SURGERY  03/2009   hemicolectomy colon cancer  . COLONOSCOPY W/ BIOPSIES AND POLYPECTOMY  01/24/2011   (Crohn's)ileitis,  internal hemorrhoids  . HAND SURGERY  1991  . KNEE SURGERY Right 1981  . right knee replacement  03/2010  . TONSILLECTOMY  1952  . UPPER GASTROINTESTINAL ENDOSCOPY  05/16/2010   normal  . VIDEO BRONCHOSCOPY Bilateral 12/18/2014   Procedure: VIDEO BRONCHOSCOPY WITHOUT FLUORO;  Surgeon: Tanda Rockers, MD;  Location: [facility];  Service: Cardiopulmonary;  Laterality: Bilateral;   Social History   Social History Narrative   Lives with husband and 2 grand dtr; 9 and  16        Objective: Vital Signs: There were no vitals taken for this visit.   Physical Exam   Musculoskeletal Exam: ***  CDAI Exam: No CDAI exam completed.    Investigation: Findings:  Feb 2018  CCP elevated 57, ACE normal  31, RF Negative, ANA negative, CRP 0.1, Sed Rate 16 , Vitamin D 23, CBC normal, CMP normal, TSH normal.     Imaging: [place] Limited Joint Space Structures Up Right  Result Date: 09/14/2016 Procedure: Real-time Ultrasound Guided Injection of right glenohumeral joint Device: GE Logiq Q7 Ultrasound guided injection is preferred based studies that show increased duration, increased effect, greater accuracy, decreased procedural pain, increased response rate with ultrasound guided versus blind injection. Verbal informed consent obtained. Time-out conducted. Noted no overlying erythema, induration, or other signs of local infection. Skin prepped in a sterile fashion. Local anesthesia: Topical Ethyl chloride. With sterile technique and under real time ultrasound guidance:  Joint visualized.  23g 1  inch needle inserted posterior approach. Pictures taken for needle placement. Patient did have injection of 2 cc of 1% lidocaine, 2 cc of 0.5% Marcaine, and 1.0 cc of Kenalog 40 mg/dL. Completed without difficulty Pain immediately resolved suggesting accurate placement of the medication. Advised to call if fevers/chills, erythema, induration, drainage, or persistent bleeding. Images permanently stored and available for review in the ultrasound unit. Impression: Technically successful ultrasound guided injection.  Procedure: Real-time Ultrasound Guided Injection of left glenohumeral joint Device: GE Logiq E Ultrasound guided injection is preferred based studies that show increased duration, increased effect, greater accuracy, decreased procedural pain, increased response rate with ultrasound guided versus blind injection. Verbal informed consent obtained. Time-out conducted. Noted no  overlying erythema, induration, or other signs of local infection. Skin prepped in a sterile fashion. Local anesthesia: Topical Ethyl chloride. With sterile technique and under real time ultrasound guidance:  Joint visualized.  21g 2 inch needle inserted posterior approach. Pictures taken for needle placement. Patient did have injection of 2 cc of 0.5% Marcaine, and 1cc of Kenalog 40 mg/dL. Completed without difficulty Pain immediately resolved suggesting accurate placement of the medication. Advised to call if fevers/chills, erythema, induration, drainage, or persistent bleeding. Images permanently stored and available for review in the ultrasound unit. Impression: Technically successful ultrasound guided injection.   Ct Chest Lung Ca Screen Low Dose W/o Cm  Result Date: 09/06/2016 CLINICAL DATA:  Fifty pack-year smoking history.  Asymptomatic. EXAM: CT CHEST WITHOUT CONTRAST LOW-DOSE FOR LUNG CANCER SCREENING TECHNIQUE: Multidetector CT imaging of the chest was performed following the standard protocol without IV contrast. COMPARISON:  Plain films 07/03/2016. Diagnostic CT of 12/10/2014. No prior screening CT. FINDINGS: Cardiovascular: Aortic and branch vessel atherosclerosis. Normal heart size, without pericardial effusion. Multivessel coronary artery atherosclerosis. Mediastinum/Nodes: No mediastinal or definite hilar adenopathy, given limitations of unenhanced CT. Lungs/Pleura: No pleural fluid. Mild centrilobular emphysema. given differences in technique and inspiratory effort, similar posterior right middle lobe volume loss with compression or narrowing of subtending bronchi, including  on image 175/series 3. Lower lobe predominant bronchial wall thickening. Pleural-based anterior right upper lobe opacity is new since the prior exam, favored to represent an area of interval scarring. This measures volume derived equivalent diameter 28 mm. Clustered right lower lobe "Tree-in-bud" nodularity laterally is new  since the prior. Reticulonodular opacity in the inferolateral left lower lobe. The most masslike component measures volume derived equivalent diameter 13.4 mm. Progressive left lower lobe bronchial wall thickening. Upper Abdomen: Cholecystectomy. Normal imaged portions of the spleen, pancreas, kidneys, right adrenal gland. Low-density left adrenal thickening and nodularity is similar, including at 2.8 cm on image 60/series 2. Abdominal aortic atherosclerosis. Musculoskeletal: Moderate thoracic spondylosis. IMPRESSION: 1. Lung-RADS 3, probably benign findings. Short-term follow-up in 6 months is recommended with repeat low-dose chest CT without contrast (please use the following order, "CT CHEST LCS NODULE FOLLOW-UP W/O CM"). Bibasilar nodular opacities are favored to represent areas of age-indeterminate but interval infection. More masslike components within the left lower and right upper lobe could also represent areas of infection or interval scar. Neoplasm felt less likely. Recommend attention on follow-up. 2. Chronic right middle lobe volume loss with posterior endobronchial compression or narrowing. Chronicity favors a benign etiology. 3.  Coronary artery atherosclerosis. Aortic atherosclerosis. Electronically Signed   By: Abigail Miyamoto M.D.   On: 09/06/2016 13:20   Xr Hips Bilat W Or W/o Pelvis 3-4 Views  Result Date: 08/25/2016 Bilateral mild inferior medial joint narrowing. SI joints appear normal.  Xr Hand 2 View Left  Result Date: 08/25/2016 PIP/DIP narrowing and CMC narrowing was noted. No MCP joint narrowing or erosive changes were noted. No intercarpal joint space narrowing or erosive changes noted. Impression: These findings are consistent with osteoarthritis  Xr Hand 2 View Right  Result Date: 08/25/2016 PIP/DIP narrowing and CMC narrowing was noted. No MCP joint narrowing or erosive changes were noted. No intercarpal joint space narrowing or erosive changes noted. Impression: These  findings are consistent with osteoarthritis  Xr Knee 3 View Left  Result Date: 08/25/2016 Moderate medial compartment narrowing was noted. Chondrocalcinosis was noted. Moderate patellofemoral narrowing was noted. Impression: Findings are consistent with moderate osteoarthritis, chondromalacia patella and chondrocalcinosis   Speciality Comments: No specialty comments available.    Procedures:  No procedures performed Allergies: Morphine   Assessment / Plan:     Visit Diagnoses: Polyarthralgia  Chondrocalcinosis  Positive anti-CCP test  Primary osteoarthritis involving multiple joints  Family history of rheumatoid arthritis  Complete rotator cuff tear of left shoulder  Subacromial bursitis  Cervical disc disorder with radiculopathy of cervical region  Primary localized osteoarthrosis of left lower leg  History of total knee replacement, right  Vitamin D deficiency  History of myocardial infarction / History of Takotsubo cardiomyopathy   History of malignant neoplasm of large intestine  History of COPD  Cigarette smoker  History of hyperlipidemia    Orders: No orders of the defined types were placed in this encounter.  No orders of the defined types were placed in this encounter.   Face-to-face time spent with patient was *** minutes. 50% of time was spent in counseling and coordination of care.  Follow-Up Instructions: No Follow-up on file.   Erica [name], RT  Note - This record has been created using [employer].  Chart creation errors have been sought, but may not always  have been located. Such creation errors do not reflect on  the standard of medical care.

## 2016-09-26 ENCOUNTER — Ambulatory Visit: Payer: Self-pay | Admitting: Rheumatology

## 2016-09-28 ENCOUNTER — Ambulatory Visit (INDEPENDENT_AMBULATORY_CARE_PROVIDER_SITE_OTHER): Payer: Medicare Other | Admitting: Family Medicine

## 2016-09-28 ENCOUNTER — Encounter: Payer: Self-pay | Admitting: Family Medicine

## 2016-09-28 VITALS — BP 118/72 | HR 79 | Ht 62.0 in | Wt 124.0 lb

## 2016-09-28 DIAGNOSIS — M75102 Unspecified rotator cuff tear or rupture of left shoulder, not specified as traumatic: Secondary | ICD-10-CM

## 2016-09-28 DIAGNOSIS — M12811 Other specific arthropathies, not elsewhere classified, right shoulder: Secondary | ICD-10-CM

## 2016-09-28 DIAGNOSIS — M12812 Other specific arthropathies, not elsewhere classified, left shoulder: Secondary | ICD-10-CM

## 2016-09-28 DIAGNOSIS — M5010 Cervical disc disorder with radiculopathy, unspecified cervical region: Secondary | ICD-10-CM | POA: Diagnosis not present

## 2016-09-28 DIAGNOSIS — M75101 Unspecified rotator cuff tear or rupture of right shoulder, not specified as traumatic: Secondary | ICD-10-CM

## 2016-09-28 MED ORDER — IBUPROFEN 800 MG PO TABS: 800.0000 mg | ORAL_TABLET | Freq: Three times a day (TID) | ORAL | 2 refills | Status: DC | PRN

## 2016-09-28 MED ORDER — FAMOTIDINE 20 MG PO TABS: 20.0000 mg | ORAL_TABLET | Freq: Two times a day (BID) | ORAL | 1 refills | Status: DC

## 2016-09-28 NOTE — Assessment & Plan Note (Signed)
Patient did have injections. Very minimal response. Continued home exercises. Likely secondary to more a cervical radiculopathy with no significant improvement.

## 2016-09-28 NOTE — Progress Notes (Signed)
Erica Erica [name] Erica [name], Erica [name] [id number] Phone: [phone] Subjective:    I'm seeing this patient by the request  of:    CC: Bilateral shoulder pain follow-up  Erica [name]:[id number]  Erica [name] is a 66 y.o. female coming in with complaint of bilateral shoulder pain.Patient has a history of Crohn's disease as well as chondrocalcinosis and felt that this is inflammatory and likely more of a subacromial bursitis. Patient was given an injection in the shoulder back in May 30. In the shoulders bilaterally. Patient states minimal improvement. Patient states while she was on the ibuprofen she seemed to do somewhat better. Had some mild GI irritation but nothing severe. Feels like was the only thing that has helped the pain other than hydrocodone.  Patient also saw her rheumatologist 2 days ago. Patient states   regarding patient's neck patient was found to have severe arthritic and facet changes C5-6. Patient did have an epidural done previously with fairly good results. This was in May 2016  Patient statesThe injections in the shoulders gave some mild improvement but not severe. Continues to have the chronic neck pain. Season of the provider for chronic back pain.  Patient was found to have a positive anti-CCP test. Patient did see rheumatology for the polyarthralgia and was diagnosed more with the Crohn's disease likely given falls positive. Patient continues to have a dull, throbbing aching pain all over.   Past Medical History:  Diagnosis Date  . ADENOCARCINOMA, COLON, HX OF 03/2009  . ANEMIA   . BACK PAIN, LUMBAR   . Cancer [facility]) 2010   colon cancer  . CHF (congestive heart failure) ([place])   . COPD (chronic obstructive pulmonary disease) with emphysema ([place])   . Crohn's  02/2010   ileal ulcers, intol of entercort--refuses treatment  . GERD   . History of transfusion of whole blood   . HYPERLIPIDEMIA   . HYPERTENSION   . Microscopic hematuria      chronic  . Obstruction of intestine or colon ([facility])    adhesions  . OSTEOARTHRITIS   . Rectal fissure    Possible fissure  . Rotator cuff tear   . Takotsubo syndrome 12/2008  . URINARY INCONTINENCE    Past Surgical History:  Procedure Laterality Date  . ABDOMINAL HYSTERECTOMY  1994  . APPENDECTOMY  1964  . BREAST SURGERY    . CESAREAN SECTION     x 3  . CHOLECYSTECTOMY  1995  . COLON SURGERY  03/2009   hemicolectomy colon cancer  . COLONOSCOPY W/ BIOPSIES AND POLYPECTOMY  01/24/2011   (Crohn's)ileitis, internal hemorrhoids  . HAND SURGERY  1991  . KNEE SURGERY Right 1981  . right knee replacement  03/2010  . TONSILLECTOMY  1952  . UPPER GASTROINTESTINAL ENDOSCOPY  05/16/2010   normal  . VIDEO BRONCHOSCOPY Bilateral 12/18/2014   Procedure: VIDEO BRONCHOSCOPY WITHOUT FLUORO;  Surgeon: Tanda Rockers, MD;  Location: [facility];  Service: Cardiopulmonary;  Laterality: Bilateral;   Social History   Social History  . Marital status: Married    Spouse name: N/A  . Number of children: N/A  . Years of education: N/A   Occupational History  . Retired  Unemployed   Social History Main Topics  . Smoking status: Current Every Day Smoker    Packs/day: 1.00    Years: 50.00    Types: Cigarettes  . Smokeless tobacco: Never Used  . Alcohol use No  . Drug use: No  .  Sexual activity: Not Asked   Other Topics Concern  . None   Social History Narrative   Lives with husband and 2 grand dtr; 9 and 16      Allergies  Allergen Reactions  . Morphine Nausea And Vomiting   Family History  Problem Relation Age of Onset  . Colon cancer Father 71  . Hypertension Father   . Heart disease Father   . Kidney disease Father   . Colon cancer Sister 46  . Throat cancer Mother   . Arthritis Other        Parent, other relative    Past medical history, social, surgical and family history all reviewed in electronic medical record.  No pertanent information unless stated regarding to  the chief complaint.   Review of Systems: No headache, visual changes, nausea, vomiting, diarrhea, constipation, dizziness, abdominal pain, skin rash, fevers, chills, night sweats, weight loss, swollen lymph nodes, body aches, joint swelling, muscle aches, chest pain, shortness of breath, mood changes.    Objective  Blood pressure 118/72, pulse 79, height 5' 2"  (1.575 m), weight 124 lb (56.2 kg), SpO2 94 %.   Systems examined below as of 09/28/16 General: NAD A&O x3 mood, affect normal  HEENT: Pupils equal, extraocular movements intact no nystagmus Respiratory: not short of breath at rest or with speaking Cardiovascular: No lower extremity edema, non tender Skin: Warm dry intact with no signs of infection or rash on extremities or on axial skeleton. Abdomen: Soft nontender, no masses Neuro: Cranial nerves  intact, neurovascularly intact in all extremities with 2+ DTRs and 2+ pulses. Lymph: No lymphadenopathy appreciated today  Gait normal with good balance and coordination.  MSK: tender with full range of motion and good stability and symmetric strength and tone of elbows, wrist,  knee hips and ankles bilaterally.  Arthritic changes of multiple joints Shoulder: Bilateral Inspection reveals no abnormalities, atrophy or asymmetry. Palpation is normal with no tenderness over AC joint or bicipital groove. ROM is full in all planes. Impingement sign still noted No signs of impingement with negative Neer and Hawkin's tests, empty can sign. Speeds and Yergason's tests normal. O'Brien's positive Normal scapular function observed. No painful arc and no drop arm sign. No apprehension sign     Neck: Inspection mild increase in lordosis. No palpable stepoffs. Positive Spurling's maneuver bilaterally. Grip strength and sensation normal in bilateral hands Strength good very mild weakness in the C6-C7 distribution bilaterally No sensory change to C4 to T1 Negative Hoffman sign  bilaterally Reflexes normal     Impression and Recommendations:     This case required medical decision making of moderate complexity.      Note: This dictation was prepared with Dragon dictation along with smaller phrase technology. Any transcriptional errors that result from this process are unintentional.

## 2016-09-28 NOTE — Patient Instructions (Addendum)
Good to see you  Erica [name] is your friend.  We will get epidural in the neck.  Continue the medicines.  See me again 3 weeks after the epidural

## 2016-09-28 NOTE — Assessment & Plan Note (Signed)
Worsening symptoms. Patient does have cervical arthritic changes. Likely some cervical radiculopathy occurring. Patient will be set up for an epidural for diagnostic and hopefully therapeutic purposes. We discussed icing regimen. Continue all other medications. Patient was given ibuprofen but warned to monitor stomach issues. Follow-up in 3 weeks after injection.

## 2016-10-02 ENCOUNTER — Other Ambulatory Visit: Payer: Self-pay | Admitting: Pediatrics

## 2016-10-02 DIAGNOSIS — M5010 Cervical disc disorder with radiculopathy, unspecified cervical region: Secondary | ICD-10-CM

## 2016-10-03 NOTE — Progress Notes (Signed)
Office Visit Note  Patient: Erica [name]             Date of Birth: 1950-10-29           MRN: [id number]             PCP: [name], MD Referring: [name], * Visit Date: 10/09/2016 Occupation: @GUAROCC @    Subjective:  Results   History of Present Illness: Erica [name] is a 66 y.o. female with history of Crohn's disease and polyarthralgia. She states she's been having a lot of pain and discomfort in her neck. She is scheduled to have some injection in the neck area. She's been having increased pain in almost all of her joints in the last 2 weeks. She states her right total knee replacement continues to hurt. She notices some swelling in her hands and having discomfort. She states her Crohn's is not very active. She has about 2 loose stools a day. She denies any blood or mucus in her stool.  Activities of Daily Living:  Patient reports morning stiffness for 10 minutes.   Patient Reports nocturnal pain.  Difficulty dressing/grooming: Denies Difficulty climbing stairs: Reports Difficulty getting out of chair: Reports Difficulty using hands for taps, buttons, cutlery, and/or writing: Denies   Review of Systems  Constitutional: Positive for fatigue. Negative for night sweats, weight gain, weight loss and weakness.  HENT: Negative for mouth sores, trouble swallowing, trouble swallowing, mouth dryness and nose dryness.   Eyes: Negative for pain, redness, visual disturbance and dryness.  Respiratory: Negative for cough, shortness of breath and difficulty breathing.   Cardiovascular: Negative for chest pain, palpitations, hypertension, irregular heartbeat and swelling in legs/feet.  Gastrointestinal: Positive for diarrhea. Negative for blood in stool and constipation.  Endocrine: Negative for increased urination.  Genitourinary: Negative for vaginal dryness.  Musculoskeletal: Positive for arthralgias, joint pain and morning stiffness. Negative for joint  swelling, myalgias, muscle weakness, muscle tenderness and myalgias.  Skin: Negative for color change, rash, hair loss, skin tightness, ulcers and sensitivity to sunlight.  Allergic/Immunologic: Negative for susceptible to infections.  Neurological: Negative for dizziness, memory loss and night sweats.  Hematological: Negative for swollen glands.  Psychiatric/Behavioral: Positive for sleep disturbance. Negative for depressed mood. The patient is not nervous/anxious.     PMFS History:  Patient Active Problem List   Diagnosis Date Noted  . Bronchitis 09/08/2016  . COPD exacerbation ([place]) 09/08/2016  . 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 History of total knee replacement, right 08/25/2016  . Family history of rheumatoid arthritis 08/25/2016  . Chondrocalcinosis 08/25/2016  . Vitamin D deficiency 08/17/2016  . Skin lesion 07/11/2016  . Positive anti-CCP test 06/05/2016  . Polyarthralgia 05/08/2016  . Weight loss 05/05/2016  . Chest pain 08/16/2015  . Stomach pain 06/18/2015  . Cervical disc disorder with radiculopathy of cervical region 06/08/2014  . Right shoulder pain 04/20/2014  . Subacromial bursitis 01/26/2014  . Complete rotator cuff tear of left shoulder 11/27/2013  . Primary localized osteoarthrosis, lower leg 11/27/2013  . COPD  GOLD II    . Microscopic hematuria   . Cigarette smoker 02/27/2011  . 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 History of malignant neoplasm of large intestine 02/18/2010  . Hyperlipidemia 02/16/2010  . History of myocardial infarction 02/16/2010  . GERD 02/16/2010  . Osteoarthritis 02/16/2010  . Essential hypertension 02/14/2010  . Takotsubo syndrome 02/12/2009    Past Medical History:  Diagnosis Date  . ADENOCARCINOMA, COLON, HX OF 03/2009  . ANEMIA   . BACK PAIN, LUMBAR   . Cancer [facility]) 2010   colon cancer  . CHF (congestive heart failure) ([place])   . COPD (chronic  obstructive pulmonary disease) with emphysema ([place])   . Crohn's  02/2010   ileal ulcers, intol of entercort--refuses treatment  . GERD   . History of transfusion of whole blood   . HYPERLIPIDEMIA   . HYPERTENSION   . Microscopic hematuria    chronic  . Obstruction of intestine or colon (HCC)    adhesions  . OSTEOARTHRITIS   . Rectal fissure    Possible fissure  . Rotator cuff tear   . Takotsubo syndrome 12/2008  . URINARY INCONTINENCE     Family History  Problem Relation Age of Onset  . Colon cancer Father 49  . Hypertension Father   . Heart disease Father   . Kidney disease Father   . Colon cancer Sister 26  . Throat cancer Mother   . Arthritis Other        Parent, other relative   Past Surgical History:  Procedure Laterality Date  . ABDOMINAL HYSTERECTOMY  1994  . APPENDECTOMY  1964  . BREAST SURGERY    . CESAREAN SECTION     x 3  . CHOLECYSTECTOMY  1995  . COLON SURGERY  03/2009   hemicolectomy colon cancer  . COLONOSCOPY W/ BIOPSIES AND POLYPECTOMY  01/24/2011   (Crohn's)ileitis, internal hemorrhoids  . HAND SURGERY  1991  . KNEE SURGERY Right 1981  . right knee replacement  03/2010  . TONSILLECTOMY  1952  . UPPER GASTROINTESTINAL ENDOSCOPY  05/16/2010   normal  . VIDEO BRONCHOSCOPY Bilateral 12/18/2014   Procedure: VIDEO BRONCHOSCOPY WITHOUT FLUORO;  Surgeon: Tanda Rockers, MD;  Location: [facility];  Service: Cardiopulmonary;  Laterality: Bilateral;   Social History   Social History Narrative   Lives with husband and 2 grand dtr; 9 and 16        Objective: Vital Signs: BP 124/70   Pulse 72   Resp 14   Ht 5' 2"  (1.575 m)   Wt 128 lb (58.1 kg)   BMI 23.41 kg/m    Physical Exam  Constitutional: She is oriented to person, place, and time. She appears well-developed and well-nourished.  HENT:  Head: Normocephalic and atraumatic.  Eyes: Conjunctivae and EOM are normal.  Neck: Normal range of motion.  Cardiovascular: Normal rate, regular rhythm,  normal heart sounds and intact distal pulses.   Pulmonary/Chest: Effort normal and breath sounds normal.  Abdominal: Soft. Bowel sounds are normal.  Lymphadenopathy:    She has no cervical adenopathy.  Neurological: She is alert and oriented to person, place, and time.  Skin: Skin is warm and dry. Capillary refill takes less than 2 seconds.  Psychiatric: She has a normal mood and affect. Her behavior is normal.  Nursing note and vitals reviewed.    Musculoskeletal Exam: C-spine limited range of motion with discomfort. Lumbar spine limited range of motion with discomfort. Shoulder joints although joints wrist joint MCPs PIPs DIPs with good range of motion. She has DIP PIP thickening in her hands consistent with osteoarthritis. No synovitis was noted. Hip joints are good range of motion with some discomfort. She has right total knee replacement which appears to be doing well although she had painful range of motion of her right knee.  CDAI Exam: No CDAI exam completed.    Investigation: No additional  findings.   Imaging: No results found.  Speciality Comments: No specialty comments available.  05/05/2016 CBC normal, CMP normal, ferritin normal, February 50,018 ESR 16 normal, vitamin D low 23.22, ANA negative, RF negative, CCP antibody positive at 57, ace negative  Procedures:  No procedures performed Allergies: Morphine   Assessment / Plan:     Visit Diagnoses: Crohn's disease of large intestine without complication ([place]): According to patient her disease is not active. She has intermittent diarrhea without any blood or mucus.  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  Primary osteoarthritis of both hips -  Bilateral mild  Primary osteoarthritis of left knee - Moderate osteoarthritis with moderate chondromalacia patella and chondrocalcinosis  Primary osteoarthritis of both hands: Joint protection and muscle strengthening discussed.  History of total knee replacement, right: Chronic pain  DJD (degenerative joint disease), cervical: Chronic pain  DDD lumbar spine: Chronic pain  Family history of rheumatoid arthritis - in her mother  History of malignant neoplasm of large intestine - Colon cancer diagnosed December 2010, status post right hemicolectomy  Cigarette smoker: Smoking cessation was discussed. Association of his smoking within the CCP antibody was also discussed.  History of COPD  History of hypertension  History of myocardial infarction  Takotsubo syndrome - Cardiomyopathy    Orders: No orders of the defined types were placed in this encounter.  No orders of the defined types were placed in this encounter.   Face-to-face time spent with patient was 30 minutes. 50% of time was spent in counseling and coordination of care.  Follow-Up Instructions: Return for Crohn,s Dis, OA, DDD, CPPD.   Bo Merino, MD  Note - This record has been created using Editor, commissioning.  Chart creation errors have been sought, but may not always  have been located. Such creation errors do not reflect on  the standard of medical care.

## 2016-10-09 ENCOUNTER — Encounter: Payer: Self-pay | Admitting: Rheumatology

## 2016-10-09 ENCOUNTER — Ambulatory Visit (INDEPENDENT_AMBULATORY_CARE_PROVIDER_SITE_OTHER): Payer: Medicare Other | Admitting: Rheumatology

## 2016-10-09 VITALS — BP 124/70 | HR 72 | Resp 14 | Ht 62.0 in | Wt 128.0 lb

## 2016-10-09 DIAGNOSIS — Z85038 Personal history of other malignant neoplasm of large intestine: Secondary | ICD-10-CM

## 2016-10-09 DIAGNOSIS — M47816 Spondylosis without myelopathy or radiculopathy, lumbar region: Secondary | ICD-10-CM | POA: Diagnosis not present

## 2016-10-09 DIAGNOSIS — Z96651 Presence of right artificial knee joint: Secondary | ICD-10-CM

## 2016-10-09 DIAGNOSIS — M1120 Other chondrocalcinosis, unspecified site: Secondary | ICD-10-CM | POA: Diagnosis not present

## 2016-10-09 DIAGNOSIS — I252 Old myocardial infarction: Secondary | ICD-10-CM

## 2016-10-09 DIAGNOSIS — M5030 Other cervical disc degeneration, unspecified cervical region: Secondary | ICD-10-CM | POA: Diagnosis not present

## 2016-10-09 DIAGNOSIS — M160 Bilateral primary osteoarthritis of hip: Secondary | ICD-10-CM

## 2016-10-09 DIAGNOSIS — M2550 Pain in unspecified joint: Secondary | ICD-10-CM

## 2016-10-09 DIAGNOSIS — M12811 Other specific arthropathies, not elsewhere classified, right shoulder: Secondary | ICD-10-CM

## 2016-10-09 DIAGNOSIS — K5010 Crohn's disease of large intestine without complications: Secondary | ICD-10-CM

## 2016-10-09 DIAGNOSIS — F17210 Nicotine dependence, cigarettes, uncomplicated: Secondary | ICD-10-CM

## 2016-10-09 DIAGNOSIS — M1712 Unilateral primary osteoarthritis, left knee: Secondary | ICD-10-CM | POA: Diagnosis not present

## 2016-10-09 DIAGNOSIS — Z8261 Family history of arthritis: Secondary | ICD-10-CM | POA: Diagnosis not present

## 2016-10-09 DIAGNOSIS — M47812 Spondylosis without myelopathy or radiculopathy, cervical region: Secondary | ICD-10-CM

## 2016-10-09 DIAGNOSIS — I5181 Takotsubo syndrome: Secondary | ICD-10-CM

## 2016-10-09 DIAGNOSIS — M12812 Other specific arthropathies, not elsewhere classified, left shoulder: Secondary | ICD-10-CM

## 2016-10-09 DIAGNOSIS — Z8679 Personal history of other diseases of the circulatory system: Secondary | ICD-10-CM

## 2016-10-09 DIAGNOSIS — M19041 Primary osteoarthritis, right hand: Secondary | ICD-10-CM | POA: Diagnosis not present

## 2016-10-09 DIAGNOSIS — M75101 Unspecified rotator cuff tear or rupture of right shoulder, not specified as traumatic: Secondary | ICD-10-CM

## 2016-10-09 DIAGNOSIS — Z8709 Personal history of other diseases of the respiratory system: Secondary | ICD-10-CM

## 2016-10-09 DIAGNOSIS — R768 Other specified abnormal immunological findings in serum: Secondary | ICD-10-CM

## 2016-10-09 DIAGNOSIS — R7681 Abnormal rheumatoid factor and anti-citrullinated protein antibody without rheumatoid arthritis: Secondary | ICD-10-CM

## 2016-10-09 DIAGNOSIS — M75102 Unspecified rotator cuff tear or rupture of left shoulder, not specified as traumatic: Secondary | ICD-10-CM

## 2016-10-09 DIAGNOSIS — M19042 Primary osteoarthritis, left hand: Secondary | ICD-10-CM

## 2016-10-09 NOTE — Patient Instructions (Signed)
Natural anti-inflammatories  You can purchase these at [employer], [employer] or online.  . Turmeric (capsules)  . Ginger (ginger root or capsules)  . Omega 3 (Fish, flax seeds, chia seeds, walnuts, almonds)  . Tart cherry (dried or extract)   Patient should be under the care of a physician while taking these supplements. This may not be reproduced without the permission of Dr. Bo Merino.

## 2016-10-11 ENCOUNTER — Ambulatory Visit: Payer: Self-pay | Admitting: Rheumatology

## 2016-10-12 ENCOUNTER — Ambulatory Visit
Admission: RE | Admit: 2016-10-12 | Discharge: 2016-10-12 | Disposition: A | Discharge status: Home / Self Care | Payer: Medicare Other | Source: Ambulatory Visit | Attending: Family Medicine | Admitting: Family Medicine

## 2016-10-12 DIAGNOSIS — M5010 Cervical disc disorder with radiculopathy, unspecified cervical region: Secondary | ICD-10-CM

## 2016-10-12 DIAGNOSIS — M50223 Other cervical disc displacement at C6-C7 level: Secondary | ICD-10-CM | POA: Diagnosis not present

## 2016-10-12 DIAGNOSIS — M50222 Other cervical disc displacement at C5-C6 level: Secondary | ICD-10-CM | POA: Diagnosis not present

## 2016-10-12 MED ORDER — TRIAMCINOLONE ACETONIDE 40 MG/ML IJ SUSP (RADIOLOGY)
60.0000 mg | Freq: Once | INTRAMUSCULAR | Status: AC
  Administered 2016-10-12: 60 mg via EPIDURAL

## 2016-10-12 MED ORDER — IOPAMIDOL (ISOVUE-M 300) INJECTION 61%
1.0000 mL | Freq: Once | INTRAMUSCULAR | Status: AC | PRN
  Administered 2016-10-12: 1 mL via EPIDURAL

## 2016-10-12 NOTE — Discharge Instructions (Signed)

## 2016-10-20 ENCOUNTER — Telehealth: Payer: Self-pay | Admitting: Internal Medicine

## 2016-10-20 NOTE — Telephone Encounter (Signed)
Pt would like a refill of HYDROcodone-acetaminophen (NORCO/VICODIN) 5-325 MG tablet  She has been previously seen by Quay Burow

## 2016-10-23 MED ORDER — HYDROCODONE-ACETAMINOPHEN 5-325 MG PO TABS: 1.0000 | ORAL_TABLET | Freq: Two times a day (BID) | ORAL | 0 refills | Status: DC | PRN

## 2016-10-23 NOTE — Telephone Encounter (Signed)
Last filled 09/18/2016 with 60 tabs for 30 days

## 2016-10-23 NOTE — Telephone Encounter (Signed)
Medication refilled and available for pick up.

## 2016-10-23 NOTE — Telephone Encounter (Signed)
This has been sent to a provider, nothing more I can do.

## 2016-10-23 NOTE — Telephone Encounter (Signed)
Patient has called back in regard.  Patient has one pill left.  Please follow up in regard at [phone].

## 2016-10-24 ENCOUNTER — Encounter: Payer: Self-pay | Admitting: Family Medicine

## 2016-10-24 ENCOUNTER — Ambulatory Visit (INDEPENDENT_AMBULATORY_CARE_PROVIDER_SITE_OTHER): Payer: Medicare Other | Admitting: Family Medicine

## 2016-10-24 DIAGNOSIS — M5010 Cervical disc disorder with radiculopathy, unspecified cervical region: Secondary | ICD-10-CM | POA: Diagnosis not present

## 2016-10-24 NOTE — Progress Notes (Signed)
[name] [address] [place], [place] [address] Phone: [phone] Subjective:    I'm seeing this patient by the request  of:    CC: Bilateral shoulder pain follow-up  [id number]  Erica [name] is a 66 y.o. female coming in with complaint of bilateral shoulder pain Has cervical region bursitis was having radicular symptoms. Sent for an epidural that was done on 09/29/2016. Tolerated the procedure well. States that she is feeling 70% better.       Past Medical History:  Diagnosis Date  . ADENOCARCINOMA, COLON, HX OF 03/2009  . ANEMIA   . BACK PAIN, LUMBAR   . Cancer [facility]) 2010   colon cancer  . CHF (congestive heart failure) ([place])   . COPD (chronic obstructive pulmonary disease) with emphysema ([place])   . Crohn's  02/2010   ileal ulcers, intol of entercort--refuses treatment  . GERD   . History of transfusion of whole blood   . HYPERLIPIDEMIA   . HYPERTENSION   . Microscopic hematuria    chronic  . Obstruction of intestine or colon (HCC)    adhesions  . OSTEOARTHRITIS   . Rectal fissure    Possible fissure  . Rotator cuff tear   . Takotsubo syndrome 12/2008  . URINARY INCONTINENCE    Past Surgical History:  Procedure Laterality Date  . ABDOMINAL HYSTERECTOMY  1994  . APPENDECTOMY  1964  . BREAST SURGERY    . CESAREAN SECTION     x 3  . CHOLECYSTECTOMY  1995  . COLON SURGERY  03/2009   hemicolectomy colon cancer  . COLONOSCOPY W/ BIOPSIES AND POLYPECTOMY  01/24/2011   (Crohn's)ileitis, internal hemorrhoids  . HAND SURGERY  1991  . KNEE SURGERY Right 1981  . right knee replacement  03/2010  . TONSILLECTOMY  1952  . UPPER GASTROINTESTINAL ENDOSCOPY  05/16/2010   normal  . VIDEO BRONCHOSCOPY Bilateral 12/18/2014   Procedure: VIDEO BRONCHOSCOPY WITHOUT FLUORO;  Surgeon: Tanda Rockers, MD;  Location: [facility];  Service: Cardiopulmonary;  Laterality: Bilateral;   Social History   Social History  . Marital status:  Married    Spouse name: N/A  . Number of children: N/A  . Years of education: N/A   Occupational History  . Retired  Unemployed   Social History Main Topics  . Smoking status: Current Every Day Smoker    Packs/day: 1.00    Years: 50.00    Types: Cigarettes  . Smokeless tobacco: Never Used  . Alcohol use No  . Drug use: No  . Sexual activity: Not Asked   Other Topics Concern  . None   Social History Narrative   Lives with husband and 2 grand dtr; 9 and 16      Allergies  Allergen Reactions  . Morphine Nausea And Vomiting   Family History  Problem Relation Age of Onset  . Colon cancer Father 26  . Hypertension Father   . Heart disease Father   . Kidney disease Father   . Colon cancer Sister 101  . Throat cancer Mother   . Arthritis Other        Parent, other relative    Past medical history, social, surgical and family history all reviewed in electronic medical record.  No pertanent information unless stated regarding to the chief complaint.   Review of Systems: No headache, visual changes, nausea, vomiting, diarrhea, constipation, dizziness, abdominal pain, skin rash, fevers, chills, night sweats, weight loss, swollen lymph nodes, body aches,  joint swelling, muscle aches, chest pain, shortness of breath, mood changes.    Objective  Blood pressure 110/70, pulse 95, height 5' 2"  (1.575 m), weight 125 lb (56.7 kg), SpO2 95 %.   Systems examined below as of 10/24/16 General: NAD A&O x3 mood, affect normal  HEENT: Pupils equal, extraocular movements intact no nystagmus Respiratory: not short of breath at rest or with speaking Cardiovascular: No lower extremity edema, non tender Skin: Warm dry intact with no signs of infection or rash on extremities or on axial skeleton. Abdomen: Soft nontender, no masses Neuro: Cranial nerves  intact, neurovascularly intact in all extremities with 2+ DTRs and 2+ pulses. Lymph: No lymphadenopathy appreciated today  Gait normal with  good balance and coordination.  MSK: tender with full range of motion and good stability and symmetric strength and tone of elbows, wrist,  knee hips and ankles bilaterally.  Arthritic changes of multiple joints      Neck: Inspection mild increase in lordosis. No palpable stepoffs. Positive Spurling's maneuver but less tenderness. . Grip strength and sensation normal in bilateral hands Strength good very mild weakness in the C6-C7 distribution bilaterally No sensory change to C4 to T1 Negative Hoffman sign bilaterally Reflexes normal     Impression and Recommendations:     This case required medical decision making of moderate complexity.      Note: This dictation was prepared with Dragon dictation along with smaller phrase technology. Any transcriptional errors that result from this process are unintentional.

## 2016-10-24 NOTE — Patient Instructions (Signed)
Good to see you  Ice still your friend.  Try the neck brace at night or consider a reflux pillow  Increase activity  Continue same medicines  Call in 2 weeks and if getting worse we will do another injection before your son wedding  Otherwise keep trucking along.

## 2016-10-24 NOTE — Assessment & Plan Note (Signed)
Patient does have arthritic changes. Discussed icing regimen and home exercises. Discussed which activities icing regimen noted. Discussed we can repeat injection if needed.  Will continue conservative therapy  RTC in 6 weeks if needed.

## 2016-10-24 NOTE — Telephone Encounter (Signed)
Patient contacted and stated awareness

## 2016-10-25 ENCOUNTER — Ambulatory Visit: Payer: Medicare Other | Admitting: Internal Medicine

## 2016-11-02 NOTE — Progress Notes (Signed)
Subjective:    Patient ID: Erica [name], female    DOB: 1950/08/18, 66 y.o.   MRN: [id number]  HPI She is here for an acute visit.   COPD, h/o bronchitis:  She was treated for bronchitis two months ago.  About 10 days ago she started to feel worse and feels her bronchitis/PNA is coming back.  She feels more fatigued, having chills, increased cough, wheezing and increased shortness of breath.  She has mild nasal congestion but no other cold symptoms.  Her symptoms are getting worse.    She is using the Symbicort twice daily.  She is taking nyquil.   She uses albuterol every morning.    She has had pneumonia multiple times.  She is still smoking.    Medications and allergies reviewed with patient and updated if appropriate.  Patient Active Problem List   Diagnosis Date Noted  . Bronchitis 09/08/2016  . COPD exacerbation ([place]) 09/08/2016  . 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 History of total knee replacement, right 08/25/2016  . Family history of rheumatoid arthritis 08/25/2016  . Chondrocalcinosis 08/25/2016  . Vitamin D deficiency 08/17/2016  . Skin lesion 07/11/2016  . Positive anti-CCP test 06/05/2016  . Polyarthralgia 05/08/2016  . Weight loss 05/05/2016  . Chest pain 08/16/2015  . Stomach pain 06/18/2015  . Cervical disc disorder with radiculopathy of cervical region 06/08/2014  . Right shoulder pain 04/20/2014  . Subacromial bursitis 01/26/2014  . Complete rotator cuff tear of left shoulder 11/27/2013  . Primary localized osteoarthrosis, lower leg 11/27/2013  . COPD  GOLD II    . Microscopic hematuria   . Cigarette smoker 02/27/2011  . 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 History of malignant neoplasm of large intestine 02/18/2010  . Hyperlipidemia 02/16/2010  . History of myocardial infarction 02/16/2010  . GERD 02/16/2010  . Osteoarthritis 02/16/2010  . Essential hypertension  02/14/2010  . Takotsubo syndrome 02/12/2009    Current Outpatient Prescriptions on File Prior to Visit  Medication Sig Dispense Refill  . albuterol (PROVENTIL HFA;VENTOLIN HFA) 108 (90 Base) MCG/ACT inhaler Inhale 2 puffs into the lungs daily as needed for wheezing or shortness of breath (shortness of breath). Reported on 06/18/2015 1 Inhaler 6  . budesonide-formoterol (SYMBICORT) 160-4.5 MCG/ACT inhaler Inhale 2 puffs into the lungs 2 (two) times daily. 1 Inhaler 3  . calcium carbonate (CALCIUM 600) 600 MG TABS tablet Take 600 mg by mouth daily with breakfast.    . cyclobenzaprine (FLEXERIL) 10 MG tablet Take 1 tablet (10 mg total) by mouth 3 (three) times daily as needed for muscle spasms. 60 tablet 0  . famotidine (PEPCID) 20 MG tablet Take 1 tablet (20 mg total) by mouth 2 (two) times daily. 60 tablet 1  . Ferrous Sulfate (IRON) 325 (65 Fe) MG TABS Take by mouth.    [name] HYDROcodone-acetaminophen (NORCO/VICODIN) 5-325 MG tablet Take 1 tablet by mouth 2 (two) times daily as needed for moderate pain (pain). 60 tablet 0  . ibuprofen (ADVIL,MOTRIN) 800 MG tablet Take 1 tablet (800 mg total) by mouth every 8 (eight) hours as needed. 60 tablet 2  . metoprolol tartrate (LOPRESSOR) 25 MG tablet TAKE ONE TABLET BY MOUTH TWICE DAILY 180 tablet 1  . vitamin B-12 (CYANOCOBALAMIN) 1000 MCG tablet Take 1,000 mcg by mouth daily.    . Vitamin D, Ergocalciferol, (DRISDOL) 50000 units CAPS capsule Take 1 capsule (50,000 Units total) by mouth every 7 (seven)  days. 12 capsule 0   No current facility-administered medications on file prior to visit.     Past Medical History:  Diagnosis Date  . ADENOCARCINOMA, COLON, HX OF 03/2009  . ANEMIA   . BACK PAIN, LUMBAR   . Cancer [facility]) 2010   colon cancer  . CHF (congestive heart failure) ([place])   . COPD (chronic obstructive pulmonary disease) with emphysema ([place])   . Crohn's  02/2010   ileal ulcers, intol of entercort--refuses treatment  . GERD   . History of  transfusion of whole blood   . HYPERLIPIDEMIA   . HYPERTENSION   . Microscopic hematuria    chronic  . Obstruction of intestine or colon (HCC)    adhesions  . OSTEOARTHRITIS   . Rectal fissure    Possible fissure  . Rotator cuff tear   . Takotsubo syndrome 12/2008  . URINARY INCONTINENCE     Past Surgical History:  Procedure Laterality Date  . ABDOMINAL HYSTERECTOMY  1994  . APPENDECTOMY  1964  . BREAST SURGERY    . CESAREAN SECTION     x 3  . CHOLECYSTECTOMY  1995  . COLON SURGERY  03/2009   hemicolectomy colon cancer  . COLONOSCOPY W/ BIOPSIES AND POLYPECTOMY  01/24/2011   (Crohn's)ileitis, internal hemorrhoids  . HAND SURGERY  1991  . KNEE SURGERY Right 1981  . right knee replacement  03/2010  . TONSILLECTOMY  1952  . UPPER GASTROINTESTINAL ENDOSCOPY  05/16/2010   normal  . VIDEO BRONCHOSCOPY Bilateral 12/18/2014   Procedure: VIDEO BRONCHOSCOPY WITHOUT FLUORO;  Surgeon: Tanda Rockers, MD;  Location: [facility];  Service: Cardiopulmonary;  Laterality: Bilateral;    Social History   Social History  . Marital status: Married    Spouse name: N/A  . Number of children: N/A  . Years of education: N/A   Occupational History  . Retired  Unemployed   Social History Main Topics  . Smoking status: Current Every Day Smoker    Packs/day: 1.00    Years: 50.00    Types: Cigarettes  . Smokeless tobacco: Never Used  . Alcohol use No  . Drug use: No  . Sexual activity: Not Asked   Other Topics Concern  . None   Social History Narrative   Lives with husband and 2 grand dtr; 30 and 59       Family History  Problem Relation Age of Onset  . Colon cancer Father 30  . Hypertension Father   . Heart disease Father   . Kidney disease Father   . Colon cancer Sister 44  . Throat cancer Mother   . Arthritis Other        Parent, other relative    Review of Systems  Constitutional: Positive for chills and fatigue. Negative for fever.  HENT: Positive for congestion  (mild). Negative for ear pain, sinus pain, sinus pressure and sore throat.   Respiratory: Positive for cough (productive), shortness of breath (increased) and wheezing.   Cardiovascular: Negative for chest pain, palpitations and leg swelling.  Neurological: Negative for light-headedness and headaches.       Objective:   Vitals:   11/03/16 1359  BP: 128/82  Pulse: 78  Resp: 18  Temp: 98.7 F (37.1 C)   Filed Weights   11/03/16 1359  Weight: 125 lb (56.7 kg)   Body mass index is 22.86 kg/m.  Wt Readings from Last 3 Encounters:  11/03/16 125 lb (56.7 kg)  10/24/16 125 lb (56.7 kg)  10/09/16 128 lb (58.1 kg)     Physical Exam GENERAL APPEARANCE: Appears stated age, well appearing, NAD EYES: conjunctiva clear, no icterus HEENT: bilateral tympanic membranes and ear canals normal, oropharynx with no erythema, no thyromegaly, trachea midline, no cervical or supraclavicular lymphadenopathy LUNGS: Unlabored breathing, good air entry bilaterally, expiratory wheeze b/l, no crackles  HEART: Normal S1,S2 without murmurs EXTREMITIES: Without clubbing, cyanosis, or edema        Assessment & Plan:   See Problem List for Assessment and Plan of chronic medical problems.

## 2016-11-03 ENCOUNTER — Encounter: Payer: Self-pay | Admitting: Internal Medicine

## 2016-11-03 ENCOUNTER — Telehealth: Payer: Self-pay | Admitting: Emergency Medicine

## 2016-11-03 ENCOUNTER — Ambulatory Visit (INDEPENDENT_AMBULATORY_CARE_PROVIDER_SITE_OTHER): Payer: Medicare Other | Admitting: Internal Medicine

## 2016-11-03 ENCOUNTER — Ambulatory Visit (INDEPENDENT_AMBULATORY_CARE_PROVIDER_SITE_OTHER)
Admission: RE | Admit: 2016-11-03 | Discharge: 2016-11-03 | Disposition: A | Discharge status: Home / Self Care | Payer: Medicare Other | Source: Ambulatory Visit | Attending: Internal Medicine | Admitting: Internal Medicine

## 2016-11-03 VITALS — BP 128/82 | HR 78 | Temp 98.7°F | Resp 18 | Wt 125.0 lb

## 2016-11-03 DIAGNOSIS — J441 Chronic obstructive pulmonary disease with (acute) exacerbation: Secondary | ICD-10-CM | POA: Diagnosis not present

## 2016-11-03 DIAGNOSIS — J40 Bronchitis, not specified as acute or chronic: Secondary | ICD-10-CM

## 2016-11-03 DIAGNOSIS — J449 Chronic obstructive pulmonary disease, unspecified: Secondary | ICD-10-CM | POA: Diagnosis not present

## 2016-11-03 DIAGNOSIS — M5010 Cervical disc disorder with radiculopathy, unspecified cervical region: Secondary | ICD-10-CM

## 2016-11-03 MED ORDER — LEVOFLOXACIN 500 MG PO TABS: 500.0000 mg | ORAL_TABLET | Freq: Every day | ORAL | 0 refills | Status: DC

## 2016-11-03 MED ORDER — METHYLPREDNISOLONE ACETATE 80 MG/ML IJ SUSP
80.0000 mg | Freq: Once | INTRAMUSCULAR | Status: AC
  Administered 2016-11-03: 80 mg via INTRAMUSCULAR

## 2016-11-03 MED ORDER — ALBUTEROL SULFATE HFA 108 (90 BASE) MCG/ACT IN AERS: INHALATION_SPRAY | RESPIRATORY_TRACT | 11 refills | Status: DC

## 2016-11-03 NOTE — Patient Instructions (Signed)
Have a chest xray today.  Your results will be released to [place] (or called to you) after review, usually within 72hours after test completion. If any changes need to be made, you will be notified at that same time.    Medications reviewed and updated.  Changes include starting levaquin for your infection.  You received a steroid injection today.   Your prescription(s) have been submitted to your pharmacy. Please take as directed and contact our office if you believe you are having problem(s) with the medication(s).  Call if no improvement

## 2016-11-03 NOTE — Assessment & Plan Note (Addendum)
Moderate exacerbation related to bacterial bronchitis levaquin 500 mg x 7 days Depo-medrol 80 mg IM x 1 Symbicort, albuterol, otc cold meds cxr today Advised smoking cessation  Call if no improvement

## 2016-11-03 NOTE — Assessment & Plan Note (Addendum)
Bacterial bronchitis with COPD exacerbation Continue symbicort, albuterol prn, nyquil Has wheezing on exam CXR today to rule out pneumonia Depo-medrol 80 mg IM x 1 today levaquin 500 mg po x 7 days Call if no improvement

## 2016-11-03 NOTE — Telephone Encounter (Signed)
Pt came in and stated she is having pain in her neck and shoulder again. She is wondering if she should get the injections again. She is leaving to go out of town 11/20/16 so would like to do them before then if possible. Thanks.

## 2016-11-06 DIAGNOSIS — L821 Other seborrheic keratosis: Secondary | ICD-10-CM | POA: Diagnosis not present

## 2016-11-06 NOTE — Telephone Encounter (Signed)
Discussed with pt.  Epidural ordered & sent to [place].

## 2016-11-06 NOTE — Telephone Encounter (Signed)
Yes we can lets do another C7-T1 epidural for her.

## 2016-11-15 ENCOUNTER — Ambulatory Visit: Payer: Medicare Other | Admitting: Rheumatology

## 2016-11-17 ENCOUNTER — Ambulatory Visit
Admission: RE | Admit: 2016-11-17 | Discharge: 2016-11-17 | Disposition: A | Discharge status: Home / Self Care | Payer: Medicare Other | Source: Ambulatory Visit | Attending: Family Medicine | Admitting: Family Medicine

## 2016-11-17 DIAGNOSIS — M5010 Cervical disc disorder with radiculopathy, unspecified cervical region: Secondary | ICD-10-CM

## 2016-11-17 MED ORDER — IOPAMIDOL (ISOVUE-M 300) INJECTION 61%
1.0000 mL | Freq: Once | INTRAMUSCULAR | Status: AC | PRN
  Administered 2016-11-17: 1 mL via EPIDURAL

## 2016-11-17 MED ORDER — TRIAMCINOLONE ACETONIDE 40 MG/ML IJ SUSP (RADIOLOGY)
60.0000 mg | Freq: Once | INTRAMUSCULAR | Status: AC
  Administered 2016-11-17: 60 mg via EPIDURAL

## 2016-11-30 ENCOUNTER — Telehealth: Payer: Self-pay | Admitting: Internal Medicine

## 2016-11-30 NOTE — Telephone Encounter (Signed)
Requesting refill on hydrocodone.   °

## 2016-11-30 NOTE — Telephone Encounter (Signed)
MD out of office pls advise...Erica [name]

## 2016-12-01 MED ORDER — HYDROCODONE-ACETAMINOPHEN 5-325 MG PO TABS: 1.0000 | ORAL_TABLET | Freq: Two times a day (BID) | ORAL | 0 refills | Status: DC | PRN

## 2016-12-01 NOTE — Telephone Encounter (Signed)
Medication refilled

## 2016-12-01 NOTE — Telephone Encounter (Signed)
Forwarding to you. Can you advise in both MD absence please.

## 2016-12-05 NOTE — Telephone Encounter (Signed)
Please call patient today as soon as script is up front.

## 2016-12-05 NOTE — Telephone Encounter (Signed)
Pt informed of script upfront

## 2016-12-12 ENCOUNTER — Encounter: Payer: Self-pay | Admitting: Internal Medicine

## 2016-12-12 ENCOUNTER — Ambulatory Visit (INDEPENDENT_AMBULATORY_CARE_PROVIDER_SITE_OTHER): Payer: Medicare Other | Admitting: Internal Medicine

## 2016-12-12 DIAGNOSIS — F17210 Nicotine dependence, cigarettes, uncomplicated: Secondary | ICD-10-CM

## 2016-12-12 DIAGNOSIS — J449 Chronic obstructive pulmonary disease, unspecified: Secondary | ICD-10-CM

## 2016-12-12 DIAGNOSIS — R634 Abnormal weight loss: Secondary | ICD-10-CM

## 2016-12-12 MED ORDER — DOXYCYCLINE MONOHYDRATE 100 MG PO CAPS: 100.0000 mg | ORAL_CAPSULE | Freq: Two times a day (BID) | ORAL | 0 refills | Status: DC

## 2016-12-12 NOTE — Progress Notes (Signed)
   Subjective:    Patient ID: Erica [name], female    DOB: [birth year], 66 y.o.   MRN: [id number]  HPI The patient is a 66 YO female coming in for follow up of several medical conditions including her weight loss (stable, had CT chest for screening with need for follow up 6 months for probably benign findings, eating more, weight is not increasing as she thinks it should) and her breathing (more SOB, just got back from vacation, using albuterol more often, coughing more sputum, some restriction to activity) and her smoking (she knows that the smoking is hurting her health, she does not feel ableto quit at this time).   Review of Systems  Constitutional: Positive for activity change and unexpected weight change. Negative for appetite change, chills, fatigue and fever.  HENT: Positive for congestion. Negative for facial swelling, nosebleeds, postnasal drip, rhinorrhea, sneezing, sore throat and tinnitus.   Eyes: Negative.   Respiratory: Positive for cough, shortness of breath and wheezing. Negative for chest tightness.   Cardiovascular: Negative.   Gastrointestinal: Negative.   Musculoskeletal: Positive for arthralgias.  Skin: Negative.   Neurological: Negative.   Psychiatric/Behavioral: Negative.       Objective:   Physical Exam  Constitutional: She is oriented to person, place, and time. She appears well-developed and well-nourished.  Thin  HENT:  Head: Normocephalic and atraumatic.  Eyes: EOM are normal.  Neck: Normal range of motion.  Cardiovascular: Normal rate and regular rhythm.   Pulmonary/Chest: Effort normal. No respiratory distress. She has wheezes. She has no rales. She exhibits no tenderness.  Some rhonchi bilaterally. Partially clear with coughing.   Abdominal: Soft. She exhibits no distension. There is no tenderness. There is no rebound.  Neurological: She is alert and oriented to person, place, and time.  Skin: Skin is warm and dry.   Vitals:   12/12/16 0819  BP:  118/82  Pulse: 70  Temp: 97.9 F (36.6 C)  TempSrc: Oral  SpO2: 99%  Weight: 126 lb (57.2 kg)  Height: 5' 2"  (1.575 m)      Assessment & Plan:

## 2016-12-12 NOTE — Assessment & Plan Note (Signed)
Weight has stabilized since last visit but she is not gaining weight and she feels that she is eating a significant amount more food. Has had appropriate cancer screening to rule out malignancy and no localizing symptoms.

## 2016-12-12 NOTE — Patient Instructions (Addendum)
We have sent in the medicine for the lungs called doxycycline. Take 1 pill twice a day for 1 week.   See what Dr. [name] thinks about the back but know that you always have the final say on what you do or don't do for treatment.

## 2016-12-12 NOTE — Assessment & Plan Note (Signed)
With flare, rx for doxycycline today. Continue symbicort and albuterol as needed.

## 2016-12-12 NOTE — Progress Notes (Signed)
[name] [address] [place], [place] [address] Phone: [phone] Subjective:    I'm seeing this patient by the request  of:    CC: Low back and hip pain  [id number]  Erica [name] is a 66 y.o. female coming in with complaint of hip pain. She was going in for the second epidural injection for her cervical and woke up with shooting pain into her groin before the injection. She is unable to sleep due to the pain.   Onset- months Location- right hip  Duration- constant Character- achy Aggravating factors- sitting on toilet, laying in bed Reliving factors- nothing seems to relieve it. Therapies tried-  Severity-   percent and x-rays taken 08/25/2016 that were independently visualized by me. Patient showed sclerotic changes of the pubic symphysis more on the right side. Moderate arthritic changes of the hips bilaterally osteopenia noted Past Medical History:  Diagnosis Date  . ADENOCARCINOMA, COLON, HX OF 03/2009  . ANEMIA   . BACK PAIN, LUMBAR   . Cancer [facility]) 2010   colon cancer  . CHF (congestive heart failure) ([place])   . COPD (chronic obstructive pulmonary disease) with emphysema ([place])   . Crohn's  02/2010   ileal ulcers, intol of entercort--refuses treatment  . GERD   . History of transfusion of whole blood   . HYPERLIPIDEMIA   . HYPERTENSION   . Microscopic hematuria    chronic  . Obstruction of intestine or colon (HCC)    adhesions  . OSTEOARTHRITIS   . Rectal fissure    Possible fissure  . Rotator cuff tear   . Takotsubo syndrome 12/2008  . URINARY INCONTINENCE    Past Surgical History:  Procedure Laterality Date  . ABDOMINAL HYSTERECTOMY  1994  . APPENDECTOMY  1964  . BREAST SURGERY    . CESAREAN SECTION     x 3  . CHOLECYSTECTOMY  1995  . COLON SURGERY  03/2009   hemicolectomy colon cancer  . COLONOSCOPY W/ BIOPSIES AND POLYPECTOMY  01/24/2011   (Crohn's)ileitis, internal hemorrhoids  . HAND SURGERY  1991  .  KNEE SURGERY Right 1981  . right knee replacement  03/2010  . TONSILLECTOMY  1952  . UPPER GASTROINTESTINAL ENDOSCOPY  05/16/2010   normal  . VIDEO BRONCHOSCOPY Bilateral 12/18/2014   Procedure: VIDEO BRONCHOSCOPY WITHOUT FLUORO;  Surgeon: Tanda Rockers, MD;  Location: [facility];  Service: Cardiopulmonary;  Laterality: Bilateral;   Social History   Social History  . Marital status: Married    Spouse name: N/A  . Number of children: N/A  . Years of education: N/A   Occupational History  . Retired  Unemployed   Social History Main Topics  . Smoking status: Current Every Day Smoker    Packs/day: 1.00    Years: 50.00    Types: Cigarettes  . Smokeless tobacco: Never Used  . Alcohol use No  . Drug use: No  . Sexual activity: Not Asked   Other Topics Concern  . None   Social History Narrative   Lives with husband and 2 grand dtr; 9 and 16      Allergies  Allergen Reactions  . Morphine Nausea And Vomiting   Family History  Problem Relation Age of Onset  . Colon cancer Father 58  . Hypertension Father   . Heart disease Father   . Kidney disease Father   . Colon cancer Sister 30  . Throat cancer Mother   . Arthritis Other  Parent, other relative     Past medical history, social, surgical and family history all reviewed in electronic medical record.  No pertanent information unless stated regarding to the chief complaint.   Review of Systems:Review of systems updated and as accurate as of 12/13/16  No  visual changes, nausea, vomiting, diarrhea, constipation, dizziness, abdominal pain, skin rash, fevers, chills, night sweats, weight loss, swollen lymph nodes,chest pain, shortness of breath, mood changes. Positive headaches, muscle aches, body aches  Objective  Blood pressure 110/78, pulse 79, height 5' 2.5" (1.588 m), weight 129 lb (58.5 kg), SpO2 93 %. Systems examined below as of 12/13/16   General: No apparent distress alert and oriented x3 mood and  affect normal, dressed appropriately. Cachetic  HEENT: Pupils equal, extraocular movements intact  Respiratory: Patient's speak in full sentences and does not appear short of breath  Cardiovascular: No lower extremity edema, non tender, no erythema  Skin: Warm dry intact with no signs of infection or rash on extremities or on axial skeleton.  Abdomen: Soft mild tenderness Neuro: Cranial nerves II through XII are intact, neurovascularly intact in all extremities with  and 2+ pulses.  Lymph: No lymphadenopathy of posterior or anterior cervical chain or axillae bilaterally.  Gait antalgic.  MSK:  Moderate tender with full range of motion and good stability and symmetric strength and tone of shoulders, elbows, wrist, hip, knee and ankles bilaterally. Arthritic changes of multiple joints Back exam shows the patient does have some limitation in certain range of motion especially extension and flexion. Radicular symptoms going down the right leg with a straight leg test at 15. 4 out of 5 strength of dorsiflexion compared to contralateral sign. 1+ deep tendon reflex of the Achilles on the right compared to the left side. Unable to do [place] secondary to pain. Mild pain over the pubic symphysis as well.   Impression and Recommendations:     This case required medical decision making of moderate complexity.      Note: This dictation was prepared with Dragon dictation along with smaller phrase technology. Any transcriptional errors that result from this process are unintentional.

## 2016-12-12 NOTE — Assessment & Plan Note (Signed)
She is not able to make an attempt to quit right now. She knows that her health is adversely affected by her smoking and reviewed the health risk from smoking.

## 2016-12-13 ENCOUNTER — Encounter: Payer: Self-pay | Admitting: Family Medicine

## 2016-12-13 ENCOUNTER — Ambulatory Visit (INDEPENDENT_AMBULATORY_CARE_PROVIDER_SITE_OTHER): Payer: Medicare Other | Admitting: Family Medicine

## 2016-12-13 ENCOUNTER — Ambulatory Visit (INDEPENDENT_AMBULATORY_CARE_PROVIDER_SITE_OTHER)
Admission: RE | Admit: 2016-12-13 | Discharge: 2016-12-13 | Disposition: A | Discharge status: Home / Self Care | Payer: Medicare Other | Source: Ambulatory Visit | Attending: Family Medicine | Admitting: Family Medicine

## 2016-12-13 VITALS — BP 110/78 | HR 79 | Ht 62.5 in | Wt 129.0 lb

## 2016-12-13 DIAGNOSIS — Z780 Asymptomatic menopausal state: Secondary | ICD-10-CM | POA: Diagnosis not present

## 2016-12-13 DIAGNOSIS — M5116 Intervertebral disc disorders with radiculopathy, lumbar region: Secondary | ICD-10-CM | POA: Diagnosis not present

## 2016-12-13 DIAGNOSIS — M5416 Radiculopathy, lumbar region: Secondary | ICD-10-CM | POA: Diagnosis not present

## 2016-12-13 MED ORDER — PREDNISONE 50 MG PO TABS: 50.0000 mg | ORAL_TABLET | Freq: Every day | ORAL | 0 refills | Status: DC

## 2016-12-13 NOTE — Patient Instructions (Addendum)
Good to see you  Alvera Singh is your friend.  Prednisone daily for 5 days. Xray downstairs today  We will get MRI soon as well and depending on that we will consider injections.  For your  Bones I also want a bone density test to see how you are doing .  See me again 3 weeks after the epidural if you receive it If worsening symptoms go to ER

## 2016-12-13 NOTE — Assessment & Plan Note (Signed)
Patient is having more of a lumbar radiculopathy. With patient's other injuries I do feel a bone density is also needed to rule out anything such as a compression fracture. Radicular symptoms is corresponding with a potential herniated disc. Has had a history of this previously. Patient will have x-rays done today as well as an MRI with patient's MRI being greater than 66 years old. Discussed with patient that if worsening symptoms and pain patient is to seek medical attention immediately. We discussed icing regimen, short course of prednisone but patient has been on this multiple times secondary to her chronic pulmonary disease. Patient will come back and see me in one week. Could be a candidate for epidural steroid injections and patient wants to avoid any surgical intervention.

## 2016-12-18 ENCOUNTER — Telehealth: Payer: Self-pay | Admitting: Family Medicine

## 2016-12-18 NOTE — Telephone Encounter (Signed)
Pt called checking on the results from 12/13/16.

## 2016-12-18 NOTE — Telephone Encounter (Signed)
Spoke with pt, discussed xray results.

## 2016-12-22 ENCOUNTER — Ambulatory Visit (INDEPENDENT_AMBULATORY_CARE_PROVIDER_SITE_OTHER)
Admission: RE | Admit: 2016-12-22 | Discharge: 2016-12-22 | Disposition: A | Discharge status: Home / Self Care | Payer: Medicare Other | Source: Ambulatory Visit | Attending: Internal Medicine | Admitting: Internal Medicine

## 2016-12-22 DIAGNOSIS — Z780 Asymptomatic menopausal state: Secondary | ICD-10-CM

## 2017-01-02 ENCOUNTER — Telehealth: Payer: Self-pay | Admitting: Internal Medicine

## 2017-01-02 MED ORDER — HYDROCODONE-ACETAMINOPHEN 5-325 MG PO TABS: 1.0000 | ORAL_TABLET | Freq: Two times a day (BID) | ORAL | 0 refills | Status: DC | PRN

## 2017-01-02 NOTE — Telephone Encounter (Signed)
Printed and signed.  

## 2017-01-02 NOTE — Telephone Encounter (Signed)
Check [place] registry last filled 12/05/2016...Johny Chess

## 2017-01-02 NOTE — Telephone Encounter (Signed)
Pt called requesting a refill on HYDROcodone-acetaminophen (NORCO/VICODIN) 5-325 MG tablet. Please advise.

## 2017-01-03 ENCOUNTER — Ambulatory Visit
Admission: RE | Admit: 2017-01-03 | Discharge: 2017-01-03 | Disposition: A | Discharge status: Home / Self Care | Payer: Medicare Other | Source: Ambulatory Visit | Attending: Family Medicine | Admitting: Family Medicine

## 2017-01-03 DIAGNOSIS — M48061 Spinal stenosis, lumbar region without neurogenic claudication: Secondary | ICD-10-CM | POA: Diagnosis not present

## 2017-01-03 DIAGNOSIS — M5416 Radiculopathy, lumbar region: Secondary | ICD-10-CM

## 2017-01-03 NOTE — Telephone Encounter (Signed)
Notified pt rx ready for pick-up.../lmb 

## 2017-01-04 ENCOUNTER — Other Ambulatory Visit: Payer: Self-pay

## 2017-01-04 DIAGNOSIS — M5416 Radiculopathy, lumbar region: Secondary | ICD-10-CM

## 2017-01-04 NOTE — Progress Notes (Signed)
Spoke with patient regarding MRI results. Will order epidural for patient per Dr. [name].

## 2017-01-04 NOTE — Progress Notes (Signed)
Patient also asked about bone density scan. Per a verbal from Dr. [name], patient should continue vitamin D as she has normal bone density scan with mild osteopenia. Called patient and she voices understanding.

## 2017-01-12 ENCOUNTER — Encounter: Payer: Self-pay | Admitting: Interventional Cardiology

## 2017-01-12 ENCOUNTER — Ambulatory Visit (INDEPENDENT_AMBULATORY_CARE_PROVIDER_SITE_OTHER): Payer: Medicare Other | Admitting: Interventional Cardiology

## 2017-01-12 VITALS — BP 136/86 | HR 86 | Ht 62.5 in | Wt 124.8 lb

## 2017-01-12 DIAGNOSIS — I10 Essential (primary) hypertension: Secondary | ICD-10-CM

## 2017-01-12 DIAGNOSIS — J449 Chronic obstructive pulmonary disease, unspecified: Secondary | ICD-10-CM | POA: Diagnosis not present

## 2017-01-12 DIAGNOSIS — I5181 Takotsubo syndrome: Secondary | ICD-10-CM | POA: Diagnosis not present

## 2017-01-12 MED ORDER — METOPROLOL TARTRATE 25 MG PO TABS: 25.0000 mg | ORAL_TABLET | Freq: Two times a day (BID) | ORAL | 3 refills | Status: DC

## 2017-01-12 NOTE — Patient Instructions (Signed)

## 2017-01-12 NOTE — Progress Notes (Signed)
Cardiology Office Note    Date:  01/12/2017   ID:  Erica [name], DOB October 28, 1950, MRN [id number]  PCP:  [name], MD  Cardiologist: Sinclair Grooms, MD   Chief Complaint  Patient presents with  . Coronary Artery Disease  . Chest Pain  . Congestive Heart Failure    History of Present Illness:  Erica [name] is a 66 y.o. female who presents for History of stress-induced cardiomyopathy 2010, heavy tobacco use , hyperlipidemia, hypertension, an history of chronic diastolic heart failure.  She is doing well. She has had a lot of stress in her life since the last visit. Her husband had total knee replacement followed by significant complications, she had all of her teeth extracted and lost a lot of weight, and has not had orthopnea or chest pain. She's been very active. She has no cardiopulmonary limitations.   Past Medical History:  Diagnosis Date  . ADENOCARCINOMA, COLON, HX OF 03/2009  . ANEMIA   . BACK PAIN, LUMBAR   . Cancer [facility]) 2010   colon cancer  . CHF (congestive heart failure) ([place])   . COPD (chronic obstructive pulmonary disease) with emphysema ([place])   . Crohn's  02/2010   ileal ulcers, intol of entercort--refuses treatment  . GERD   . History of transfusion of whole blood   . HYPERLIPIDEMIA   . HYPERTENSION   . Microscopic hematuria    chronic  . Obstruction of intestine or colon (HCC)    adhesions  . OSTEOARTHRITIS   . Rectal fissure    Possible fissure  . Rotator cuff tear   . Takotsubo syndrome 12/2008  . URINARY INCONTINENCE     Past Surgical History:  Procedure Laterality Date  . ABDOMINAL HYSTERECTOMY  1994  . APPENDECTOMY  1964  . BREAST SURGERY    . CESAREAN SECTION     x 3  . CHOLECYSTECTOMY  1995  . COLON SURGERY  03/2009   hemicolectomy colon cancer  . COLONOSCOPY W/ BIOPSIES AND POLYPECTOMY  01/24/2011   (Crohn's)ileitis, internal hemorrhoids  . HAND SURGERY  1991  . KNEE SURGERY Right 1981  . right knee  replacement  03/2010  . TONSILLECTOMY  1952  . UPPER GASTROINTESTINAL ENDOSCOPY  05/16/2010   normal  . VIDEO BRONCHOSCOPY Bilateral 12/18/2014   Procedure: VIDEO BRONCHOSCOPY WITHOUT FLUORO;  Surgeon: Tanda Rockers, MD;  Location: [facility];  Service: Cardiopulmonary;  Laterality: Bilateral;    Current Medications: Outpatient Medications Prior to Visit  Medication Sig Dispense Refill  . albuterol (PROVENTIL HFA;VENTOLIN HFA) 108 (90 Base) MCG/ACT inhaler Inhale 2 puffs daily and use as needed Q 4 hr for wheeze, SOB 1 Inhaler 11  . calcium carbonate (CALCIUM 600) 600 MG TABS tablet Take 600 mg by mouth daily with breakfast.    . cyclobenzaprine (FLEXERIL) 10 MG tablet Take 1 tablet (10 mg total) by mouth 3 (three) times daily as needed for muscle spasms. 60 tablet 0  . Ferrous Sulfate (IRON) 325 (65 Fe) MG TABS Take by mouth.    [name] HYDROcodone-acetaminophen (NORCO/VICODIN) 5-325 MG tablet Take 1 tablet by mouth 2 (two) times daily as needed for moderate pain (pain). 60 tablet 0  . vitamin B-12 (CYANOCOBALAMIN) 1000 MCG tablet Take 1,000 mcg by mouth daily.    . Vitamin D, Ergocalciferol, (DRISDOL) 50000 units CAPS capsule Take 1 capsule (50,000 Units total) by mouth every 7 (seven) days. 12 capsule 0  . metoprolol tartrate (LOPRESSOR) 25 MG tablet TAKE ONE TABLET  BY MOUTH TWICE DAILY 180 tablet 1  . budesonide-formoterol (SYMBICORT) 160-4.5 MCG/ACT inhaler Inhale 2 puffs into the lungs 2 (two) times daily. (Patient not taking: Reported on 01/12/2017) 1 Inhaler 3  . doxycycline (MONODOX) 100 MG capsule Take 1 capsule (100 mg total) by mouth 2 (two) times daily. (Patient not taking: Reported on 01/12/2017) 14 capsule 0  . famotidine (PEPCID) 20 MG tablet Take 1 tablet (20 mg total) by mouth 2 (two) times daily. (Patient not taking: Reported on 01/12/2017) 60 tablet 1  . ibuprofen (ADVIL,MOTRIN) 800 MG tablet Take 1 tablet (800 mg total) by mouth every 8 (eight) hours as needed. (Patient not  taking: Reported on 01/12/2017) 60 tablet 2  . predniSONE (DELTASONE) 50 MG tablet Take 1 tablet (50 mg total) by mouth daily. (Patient not taking: Reported on 01/12/2017) 5 tablet 0   No facility-administered medications prior to visit.      Allergies:   Morphine   Social History   Social History  . Marital status: Married    Spouse name: N/A  . Number of children: N/A  . Years of education: N/A   Occupational History  . Retired  Unemployed   Social History Main Topics  . Smoking status: Current Every Day Smoker    Packs/day: 1.00    Years: 50.00    Types: Cigarettes  . Smokeless tobacco: Never Used  . Alcohol use No  . Drug use: No  . Sexual activity: Not Asked   Other Topics Concern  . None   Social History Narrative   Lives with husband and 2 grand dtr; 52 and 90        Family History:  The patient's family history includes Arthritis in her other; Colon cancer (age of onset: 24) in her sister; Colon cancer (age of onset: 54) in her father; Heart disease in her father; Hypertension in her father; Kidney disease in her father; Throat cancer in her mother.   ROS:   Please see the history of present illness.    Back pain. Snoring and wheezing. Excessive fatigue. Weight loss.  All other systems reviewed and are negative.   PHYSICAL EXAM:   VS:  BP 136/86 (BP Location: Right Arm)   Pulse 86   Ht 5' 2.5" (1.588 m)   Wt 124 lb 12.8 oz (56.6 kg)   BMI 22.46 kg/m    GEN: Well nourished, well developed, in no acute distress  HEENT: normal . Wearing dentures. Neck: no JVD, carotid bruits, or masses Cardiac: RRR; no murmurs, rubs, or gallops,no edema  Respiratory:  clear to auscultation bilaterally, normal work of breathing GI: soft, nontender, nondistended, + BS MS: no deformity or atrophy  Skin: warm and dry, no rash Neuro:  Alert and Oriented x 3, Strength and sensation are intact Psych: euthymic mood, full affect  Wt Readings from Last 3 Encounters:  01/12/17  124 lb 12.8 oz (56.6 kg)  12/13/16 129 lb (58.5 kg)  12/12/16 126 lb (57.2 kg)      Studies/Labs Reviewed:   EKG:  EKG  Normal sinus rhythm, left axis deviation, left anterior hemiblock, poor with progression V1 through 3.  Recent Labs: 05/05/2016: ALT 11; BUN 18; Creatinine, Ser 0.82; Hemoglobin 13.2; Platelets 245.0; Potassium 5.0; Sodium 140; TSH 0.60   Lipid Panel    Component Value Date/Time   CHOL 175 04/27/2015 0908   TRIG 123 04/27/2015 0908   HDL 43 (L) 04/27/2015 0908   CHOLHDL 4.1 04/27/2015 0908   VLDL 25  04/27/2015 0908   LDLCALC 107 04/27/2015 0908   LDLDIRECT 138.5 09/30/2012 1518    Additional studies/ records that were reviewed today include:  none    ASSESSMENT:    1. Takotsubo syndrome   2. Essential hypertension   3. COPD  GOLD II       PLAN:  In order of problems listed above:  1. No recurrence 2. BP is excellently controlled. 3. Not addressed  Encouragement, remain active, no change in current therapy.    Medication Adjustments/Labs and Tests Ordered: Current medicines are reviewed at length with the patient today.  Concerns regarding medicines are outlined above.  Medication changes, Labs and Tests ordered today are listed in the Patient Instructions below. Patient Instructions  Medication Instructions:  Your physician recommends that you continue on your current medications as directed. Please refer to the Current Medication list given to you today.  Labwork: None  Testing/Procedures: None  Follow-Up: Your physician wants you to follow-up in: 1 year with Dr. [name].  You will receive a reminder letter in the mail two months in advance. If you don't receive a letter, please call our office to schedule the follow-up appointment.   Any Other Special Instructions Will Be Listed Below (If Applicable).     If you need a refill on your cardiac medications before your next appointment, please call your pharmacy.       Signed, Sinclair Grooms, MD  01/12/2017 12:14 PM    [facility] [place], [place], [place]  [address] Phone: [phone]; Fax: [phone]

## 2017-01-17 ENCOUNTER — Ambulatory Visit
Admission: RE | Admit: 2017-01-17 | Discharge: 2017-01-17 | Disposition: A | Discharge status: Home / Self Care | Payer: Medicare Other | Source: Ambulatory Visit | Attending: Family Medicine | Admitting: Family Medicine

## 2017-01-17 DIAGNOSIS — M5416 Radiculopathy, lumbar region: Secondary | ICD-10-CM | POA: Diagnosis not present

## 2017-01-17 MED ORDER — METHYLPREDNISOLONE ACETATE 40 MG/ML INJ SUSP (RADIOLOG
120.0000 mg | Freq: Once | INTRAMUSCULAR | Status: AC
  Administered 2017-01-17: 120 mg via EPIDURAL

## 2017-01-17 MED ORDER — IOPAMIDOL (ISOVUE-M 200) INJECTION 41%
1.0000 mL | Freq: Once | INTRAMUSCULAR | Status: AC
  Administered 2017-01-17: 1 mL via EPIDURAL

## 2017-02-07 ENCOUNTER — Telehealth: Payer: Self-pay | Admitting: Internal Medicine

## 2017-02-07 NOTE — Telephone Encounter (Signed)
Check [place] registry last filled 01/03/2017

## 2017-02-07 NOTE — Telephone Encounter (Signed)
Patient requesting refill on hydrocodone.

## 2017-02-08 MED ORDER — HYDROCODONE-ACETAMINOPHEN 5-325 MG PO TABS: 1.0000 | ORAL_TABLET | Freq: Two times a day (BID) | ORAL | 0 refills | Status: DC | PRN

## 2017-02-08 NOTE — Telephone Encounter (Signed)
Patient informed and letter is placed with RX upfront

## 2017-02-08 NOTE — Telephone Encounter (Signed)
Printed and signed, needs letter with script.

## 2017-02-14 ENCOUNTER — Inpatient Hospital Stay (HOSPITAL_COMMUNITY)
Admission: EM | Admit: 2017-02-14 | Discharge: 2017-02-16 | DRG: 389 | Disposition: A | Discharge status: Home / Self Care | Payer: Medicare Other | Attending: Internal Medicine | Admitting: Internal Medicine

## 2017-02-14 ENCOUNTER — Ambulatory Visit: Payer: Medicare Other | Admitting: Internal Medicine

## 2017-02-14 ENCOUNTER — Ambulatory Visit: Payer: Medicare Other | Admitting: Family Medicine

## 2017-02-14 ENCOUNTER — Other Ambulatory Visit: Payer: Self-pay

## 2017-02-14 ENCOUNTER — Emergency Department (HOSPITAL_COMMUNITY): Payer: Medicare Other

## 2017-02-14 ENCOUNTER — Encounter (HOSPITAL_COMMUNITY): Payer: Self-pay | Admitting: *Deleted

## 2017-02-14 DIAGNOSIS — Z9049 Acquired absence of other specified parts of digestive tract: Secondary | ICD-10-CM

## 2017-02-14 DIAGNOSIS — E785 Hyperlipidemia, unspecified: Secondary | ICD-10-CM | POA: Diagnosis present

## 2017-02-14 DIAGNOSIS — F17210 Nicotine dependence, cigarettes, uncomplicated: Secondary | ICD-10-CM | POA: Diagnosis present

## 2017-02-14 DIAGNOSIS — I10 Essential (primary) hypertension: Secondary | ICD-10-CM | POA: Diagnosis not present

## 2017-02-14 DIAGNOSIS — R110 Nausea: Secondary | ICD-10-CM

## 2017-02-14 DIAGNOSIS — R1084 Generalized abdominal pain: Secondary | ICD-10-CM | POA: Diagnosis not present

## 2017-02-14 DIAGNOSIS — J410 Simple chronic bronchitis: Secondary | ICD-10-CM | POA: Diagnosis present

## 2017-02-14 DIAGNOSIS — K567 Ileus, unspecified: Secondary | ICD-10-CM

## 2017-02-14 DIAGNOSIS — K219 Gastro-esophageal reflux disease without esophagitis: Secondary | ICD-10-CM | POA: Diagnosis present

## 2017-02-14 DIAGNOSIS — I5181 Takotsubo syndrome: Secondary | ICD-10-CM | POA: Diagnosis present

## 2017-02-14 DIAGNOSIS — Z90710 Acquired absence of both cervix and uterus: Secondary | ICD-10-CM | POA: Diagnosis not present

## 2017-02-14 DIAGNOSIS — K56609 Unspecified intestinal obstruction, unspecified as to partial versus complete obstruction: Secondary | ICD-10-CM

## 2017-02-14 DIAGNOSIS — Z85038 Personal history of other malignant neoplasm of large intestine: Secondary | ICD-10-CM

## 2017-02-14 DIAGNOSIS — E279 Disorder of adrenal gland, unspecified: Secondary | ICD-10-CM | POA: Diagnosis not present

## 2017-02-14 DIAGNOSIS — Z720 Tobacco use: Secondary | ICD-10-CM | POA: Diagnosis not present

## 2017-02-14 DIAGNOSIS — Z96651 Presence of right artificial knee joint: Secondary | ICD-10-CM | POA: Diagnosis present

## 2017-02-14 DIAGNOSIS — Z8249 Family history of ischemic heart disease and other diseases of the circulatory system: Secondary | ICD-10-CM | POA: Diagnosis not present

## 2017-02-14 DIAGNOSIS — Z800 Family history of malignant neoplasm of digestive organs: Secondary | ICD-10-CM

## 2017-02-14 DIAGNOSIS — Z4682 Encounter for fitting and adjustment of non-vascular catheter: Secondary | ICD-10-CM | POA: Diagnosis not present

## 2017-02-14 DIAGNOSIS — K6389 Other specified diseases of intestine: Secondary | ICD-10-CM | POA: Diagnosis not present

## 2017-02-14 DIAGNOSIS — R109 Unspecified abdominal pain: Secondary | ICD-10-CM | POA: Diagnosis not present

## 2017-02-14 DIAGNOSIS — K56600 Partial intestinal obstruction, unspecified as to cause: Secondary | ICD-10-CM | POA: Diagnosis not present

## 2017-02-14 DIAGNOSIS — K5651 Intestinal adhesions [bands], with partial obstruction: Principal | ICD-10-CM | POA: Diagnosis present

## 2017-02-14 DIAGNOSIS — K5090 Crohn's disease, unspecified, without complications: Secondary | ICD-10-CM

## 2017-02-14 DIAGNOSIS — J441 Chronic obstructive pulmonary disease with (acute) exacerbation: Secondary | ICD-10-CM | POA: Diagnosis present

## 2017-02-14 DIAGNOSIS — J449 Chronic obstructive pulmonary disease, unspecified: Secondary | ICD-10-CM | POA: Diagnosis not present

## 2017-02-14 DIAGNOSIS — Z0189 Encounter for other specified special examinations: Secondary | ICD-10-CM

## 2017-02-14 HISTORY — DX: Unspecified intestinal obstruction, unspecified as to partial versus complete obstruction: K56.609

## 2017-02-14 LAB — CBC WITH DIFFERENTIAL/PLATELET
Basophils Absolute: 0.1 10*3/uL (ref 0.0–0.1)
Basophils Relative: 1 %
Eosinophils Absolute: 0.1 10*3/uL (ref 0.0–0.7)
Eosinophils Relative: 1 %
HCT: 44 % (ref 36.0–46.0)
Hemoglobin: 14.6 g/dL (ref 12.0–15.0)
Lymphocytes Relative: 31 %
Lymphs Abs: 3.4 10*3/uL (ref 0.7–4.0)
MCH: 31.9 pg (ref 26.0–34.0)
MCHC: 33.2 g/dL (ref 30.0–36.0)
MCV: 96.1 fL (ref 78.0–100.0)
Monocytes Absolute: 0.7 10*3/uL (ref 0.1–1.0)
Monocytes Relative: 6 %
Neutro Abs: 6.8 10*3/uL (ref 1.7–7.7)
Neutrophils Relative %: 61 %
Platelets: 303 10*3/uL (ref 150–400)
RBC: 4.58 MIL/uL (ref 3.87–5.11)
RDW: 12.7 % (ref 11.5–15.5)
WBC: 11 10*3/uL — ABNORMAL HIGH (ref 4.0–10.5)

## 2017-02-14 LAB — COMPREHENSIVE METABOLIC PANEL
ALT: 15 U/L (ref 14–54)
AST: 18 U/L (ref 15–41)
Albumin: 4.5 g/dL (ref 3.5–5.0)
Alkaline Phosphatase: 59 U/L (ref 38–126)
Anion gap: 8 (ref 5–15)
BUN: 14 mg/dL (ref 6–20)
CO2: 24 mmol/L (ref 22–32)
Calcium: 10 mg/dL (ref 8.9–10.3)
Chloride: 106 mmol/L (ref 101–111)
Creatinine, Ser: 0.75 mg/dL (ref 0.44–1.00)
GFR calc Af Amer: >60 mL/min (ref >60)
GFR calc non Af Amer: >60 mL/min (ref >60)
Glucose, Bld: 94 mg/dL (ref 65–99)
Potassium: 4.4 mmol/L (ref 3.5–5.1)
Sodium: 138 mmol/L (ref 135–145)
Total Bilirubin: 0.6 mg/dL (ref 0.3–1.2)
Total Protein: 7.6 g/dL (ref 6.5–8.1)

## 2017-02-14 LAB — URINALYSIS, ROUTINE W REFLEX MICROSCOPIC
Bilirubin Urine: NEGATIVE
Glucose, UA: NEGATIVE mg/dL
Ketones, ur: NEGATIVE mg/dL
Leukocytes, UA: NEGATIVE
Nitrite: NEGATIVE
Protein, ur: NEGATIVE mg/dL
Specific Gravity, Urine: 1.009 (ref 1.005–1.030)
pH: 5 (ref 5.0–8.0)

## 2017-02-14 LAB — LIPASE, BLOOD: Lipase: 31 U/L (ref 11–51)

## 2017-02-14 MED ORDER — IOPAMIDOL (ISOVUE-300) INJECTION 61%
100.0000 mL | Freq: Once | INTRAVENOUS | Status: AC | PRN
  Administered 2017-02-14: 80 mL via INTRAVENOUS

## 2017-02-14 MED ORDER — NICOTINE 14 MG/24HR TD PT24
14.0000 mg | MEDICATED_PATCH | Freq: Every day | TRANSDERMAL | Status: DC
  Administered 2017-02-14 – 2017-02-16 (×3): 14 mg via TRANSDERMAL
  Filled 2017-02-14 (×3): qty 1

## 2017-02-14 MED ORDER — HYDROMORPHONE HCL 1 MG/ML IJ SOLN
1.0000 mg | Freq: Once | INTRAMUSCULAR | Status: DC
  Filled 2017-02-14: qty 1

## 2017-02-14 MED ORDER — ONDANSETRON HCL 4 MG/2ML IJ SOLN
4.0000 mg | Freq: Four times a day (QID) | INTRAMUSCULAR | Status: DC | PRN
  Administered 2017-02-15 – 2017-02-16 (×4): 4 mg via INTRAVENOUS
  Filled 2017-02-14 (×4): qty 2

## 2017-02-14 MED ORDER — HYDROMORPHONE HCL 1 MG/ML IJ SOLN
1.0000 mg | INTRAMUSCULAR | Status: DC | PRN
  Administered 2017-02-14 – 2017-02-16 (×8): 1 mg via INTRAVENOUS
  Filled 2017-02-14 (×8): qty 1

## 2017-02-14 MED ORDER — ENOXAPARIN SODIUM 40 MG/0.4ML ~~LOC~~ SOLN
40.0000 mg | SUBCUTANEOUS | Status: DC
  Administered 2017-02-14 – 2017-02-15 (×2): 40 mg via SUBCUTANEOUS
  Filled 2017-02-14 (×2): qty 0.4

## 2017-02-14 MED ORDER — ONDANSETRON HCL 4 MG PO TABS: 4.0000 mg | ORAL_TABLET | Freq: Four times a day (QID) | ORAL | Status: DC | PRN

## 2017-02-14 MED ORDER — ALBUTEROL SULFATE (2.5 MG/3ML) 0.083% IN NEBU: 2.5000 mg | INHALATION_SOLUTION | RESPIRATORY_TRACT | Status: DC | PRN

## 2017-02-14 MED ORDER — HYDROMORPHONE HCL 1 MG/ML IJ SOLN
1.0000 mg | Freq: Once | INTRAMUSCULAR | Status: AC
  Administered 2017-02-14: 1 mg via INTRAVENOUS
  Filled 2017-02-14: qty 1

## 2017-02-14 MED ORDER — SODIUM CHLORIDE 0.9 % IV SOLN
INTRAVENOUS | Status: AC
  Administered 2017-02-14 – 2017-02-15 (×2): via INTRAVENOUS

## 2017-02-14 MED ORDER — ONDANSETRON HCL 4 MG/2ML IJ SOLN
4.0000 mg | Freq: Once | INTRAMUSCULAR | Status: AC
  Administered 2017-02-14: 4 mg via INTRAVENOUS
  Filled 2017-02-14: qty 2

## 2017-02-14 MED ORDER — IOPAMIDOL (ISOVUE-300) INJECTION 61%
INTRAVENOUS | Status: AC
  Filled 2017-02-14: qty 100

## 2017-02-14 MED ORDER — HYDRALAZINE HCL 20 MG/ML IJ SOLN: 10.0000 mg | Freq: Four times a day (QID) | INTRAMUSCULAR | Status: DC | PRN

## 2017-02-14 NOTE — ED Notes (Signed)
Patient transported to CT 

## 2017-02-14 NOTE — ED Triage Notes (Signed)
Pt complains of abdominal pain since last night. Pt states she has hx of bowel obstruction. Pt states she has hx of Chron's and always has diarrhea. Pt denies vomiting or nausea.

## 2017-02-14 NOTE — ED Provider Notes (Signed)
[facility] DEPT Provider Note   CSN: [id number] Arrival date & time: 02/14/17  0806     History   Chief Complaint Chief Complaint  Patient presents with  . Abdominal Pain    HPI Erica [name] is a 66 y.o. female.  Pt presents to the ED today with abdominal pain.  She has a hx of multiple SBOs and is worries she has another one.  The pt has not had vomiting, but does feel nauseous.  Sx started in the middle of the night.  Pt denies f/c.      Past Medical History:  Diagnosis Date  . ADENOCARCINOMA, COLON, HX OF 03/2009  . ANEMIA   . BACK PAIN, LUMBAR   . Cancer [facility]) 2010   colon cancer  . CHF (congestive heart failure) ([place])   . COPD (chronic obstructive pulmonary disease) with emphysema ([place])   . Crohn's  02/2010   ileal ulcers, intol of entercort--refuses treatment  . GERD   . History of transfusion of whole blood   . HYPERLIPIDEMIA   . HYPERTENSION   . Microscopic hematuria    chronic  . Obstruction of intestine or colon (HCC)    adhesions  . OSTEOARTHRITIS   . Rectal fissure    Possible fissure  . Rotator cuff tear   . Takotsubo syndrome 12/2008  . URINARY INCONTINENCE     Patient Active Problem List   Diagnosis Date Noted  . SBO (small bowel obstruction) ([place]) 02/14/2017  . Tobacco abuse 02/14/2017  . History of Crohn's disease 02/14/2017  . Lumbar radiculopathy, acute 12/13/2016  . Bronchitis 09/08/2016  . COPD exacerbation ([place]) 09/08/2016  . 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 History of total knee replacement, right 08/25/2016  . Family history of rheumatoid arthritis 08/25/2016  . Chondrocalcinosis 08/25/2016  . Vitamin D deficiency 08/17/2016  . Skin lesion 07/11/2016  . Positive anti-CCP test 06/05/2016  . Polyarthralgia 05/08/2016  . Weight loss 05/05/2016  . Chest pain 08/16/2015  . Stomach pain 06/18/2015  . Cervical disc disorder with radiculopathy of cervical region  06/08/2014  . Right shoulder pain 04/20/2014  . Subacromial bursitis 01/26/2014  . Complete rotator cuff tear of left shoulder 11/27/2013  . Primary localized osteoarthrosis, lower leg 11/27/2013  . COPD  GOLD II    . Microscopic hematuria   . Cigarette smoker 02/27/2011  . 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 History of malignant neoplasm of large intestine 02/18/2010  . Hyperlipidemia 02/16/2010  . History of myocardial infarction 02/16/2010  . GERD 02/16/2010  . Osteoarthritis 02/16/2010  . Essential hypertension 02/14/2010  . Takotsubo syndrome 02/12/2009    Past Surgical History:  Procedure Laterality Date  . ABDOMINAL HYSTERECTOMY  1994  . APPENDECTOMY  1964  . BREAST SURGERY    . CESAREAN SECTION     x 3  . CHOLECYSTECTOMY  1995  . COLON SURGERY  03/2009   hemicolectomy colon cancer  . COLONOSCOPY W/ BIOPSIES AND POLYPECTOMY  01/24/2011   (Crohn's)ileitis, internal hemorrhoids  . HAND SURGERY  1991  . KNEE SURGERY Right 1981  . right knee replacement  03/2010  . TONSILLECTOMY  1952  . UPPER GASTROINTESTINAL ENDOSCOPY  05/16/2010   normal    OB History    No data available       Home Medications    Prior to Admission medications   Medication Sig Start Date End Date Taking? Authorizing Provider  albuterol (PROVENTIL HFA;VENTOLIN HFA) 108 (90 Base) MCG/ACT inhaler Inhale 2 puffs daily and use as needed Q 4 hr for wheeze, SOB 11/03/16  Yes Burns, Claudina Lick, MD  calcium carbonate (CALCIUM 600) 600 MG TABS tablet Take 600 mg daily by mouth.    Yes [provider]  Diphenhydramine-PE-APAP (SEVERE COLD PO) Take 5 mLs every 6 (six) hours as needed by mouth (for cold).   Yes [provider]  ferrous sulfate 325 (65 FE) MG tablet Take 325 mg daily by mouth.   Yes [provider]  HYDROcodone-acetaminophen (NORCO/VICODIN) 5-325 MG tablet Take 1 tablet 2 (two) times daily as needed by mouth for  moderate pain (pain). 02/08/17  Yes [name], MD  metoprolol tartrate (LOPRESSOR) 25 MG tablet Take 1 tablet (25 mg total) by mouth 2 (two) times daily. 01/12/17  Yes Belva Crome, MD  Probiotic Product (PROBIOTIC DAILY) CAPS Take 1 capsule daily by mouth.   Yes [provider]  vitamin B-12 (CYANOCOBALAMIN) 1000 MCG tablet Take 1,000 mcg by mouth daily.   Yes [provider]  Vitamin D, Ergocalciferol, (DRISDOL) 50000 units CAPS capsule Take 1 capsule (50,000 Units total) by mouth every 7 (seven) days. Patient taking differently: Take 50,000 Units every Wednesday by mouth.  08/30/16  Yes Lyndal Pulley, DO    Family History Family History  Problem Relation Age of Onset  . Colon cancer Father 73  . Hypertension Father   . Heart disease Father   . Kidney disease Father   . Colon cancer Sister 30  . Throat cancer Mother   . Arthritis Other        Parent, other relative    Social History Social History   Tobacco Use  . Smoking status: Current Every Day Smoker    Packs/day: 1.00    Years: 50.00    Pack years: 50.00    Types: Cigarettes  . Smokeless tobacco: Never Used  Substance Use Topics  . Alcohol use: No    Alcohol/week: 0.0 oz  . Drug use: No     Allergies   Morphine   Review of Systems Review of Systems  Gastrointestinal: Positive for abdominal pain and nausea.  All other systems reviewed and are negative.    Physical Exam Updated Vital Signs BP 119/79 (BP Location: Right Arm)   Pulse 64   Temp 98 F (36.7 C) (Oral)   Resp 18   Ht 5' 2.5" (1.588 m)   Wt 54.4 kg (120 lb)   SpO2 96%   BMI 21.60 kg/m   Physical Exam  Constitutional: She appears well-developed and well-nourished.  HENT:  Head: Normocephalic and atraumatic.  Mouth/Throat: Oropharynx is clear and moist.  Eyes: EOM are normal. Pupils are equal, round, and reactive to light.  Cardiovascular: Normal rate, regular rhythm, normal heart sounds and intact  distal pulses.  Pulmonary/Chest: Effort normal and breath sounds normal.  Abdominal: Soft. Normal appearance and bowel sounds are normal. There is generalized tenderness.  Neurological: She is alert.  Skin: Skin is warm. Capillary refill takes less than 2 seconds.  Psychiatric: She has a normal mood and affect.  Nursing note and vitals reviewed.    ED Treatments / Results  Labs (all labs ordered are listed, but only abnormal results are displayed) Labs Reviewed  CBC WITH DIFFERENTIAL/PLATELET - Abnormal; Notable for the following components:      Result Value   WBC 11.0 (*)    All other components within normal limits  URINALYSIS, ROUTINE W REFLEX MICROSCOPIC - Abnormal; Notable for the following components:   Hgb urine dipstick MODERATE (*)    Bacteria, UA RARE (*)    Squamous Epithelial / LPF 0-5 (*)    All other components within normal limits  COMPREHENSIVE METABOLIC PANEL  LIPASE, BLOOD    EKG  EKG Interpretation None       Radiology Ct Abdomen Pelvis W Contrast  Result Date: 02/14/2017 CLINICAL DATA:  Abdominal pain. History of bowel obstructions. History of colon cancer post right hemi colectomy and Crohn's disease. EXAM: CT ABDOMEN AND PELVIS WITH CONTRAST TECHNIQUE: Multidetector CT imaging of the abdomen and pelvis was performed using the standard protocol following bolus administration of intravenous contrast. CONTRAST:  61m ISOVUE-300 IOPAMIDOL (ISOVUE-300) INJECTION 61% COMPARISON:  06/28/2015 FINDINGS: Lower chest: No acute abnormality. Hepatobiliary: No focal liver abnormality is seen. Status post cholecystectomy. No biliary dilatation. Pancreas: Unremarkable. No pancreatic ductal dilatation or surrounding inflammatory changes. Spleen: Normal in size without focal abnormality. Adrenals/Urinary Tract: Normal right adrenal gland. Hypoattenuated mass within the left adrenal measures 2.3 cm, stable. No evidence of hydronephrosis or renal calculi. Bilateral scattered  too small to be actually characterize hypoattenuated lesions throughout the kidneys. Stomach/Bowel: Diffuse circumferential thickening of the gastric mucosa in the gastric cardia and body. Post right hemicolectomy. Borderline dilated gas and fluid-filled small bowel loops within the central lower abdomen with maximum diameter of 3.6 cm. Transitional point is seen in the right hemipelvis, image 53/29/sequence 2. No obstructing mass is seen. The residual colon demonstrates normal pattern. Vascular/Lymphatic: Aortic atherosclerosis. No enlarged abdominal or pelvic lymph nodes. Reproductive: Status post hysterectomy. No adnexal masses. Other: No evidence of free gas in the abdomen. Small amount of free fluid in the right hemipelvis. Musculoskeletal: Degenerative disc disease and posterior facet arthropathy in the lower lumbosacral spine. IMPRESSION: Early/incomplete small bowel obstruction versus ileus with transitional point in the right hemipelvis. No obstructing masses seen. Persistent stable in size indeterminate left adrenal mass. Nonspecific diffuse mucosal thickening of the gastric body and cardia. This may represent inflammatory/infectious changes, hypertrophy or potentially infiltrating malignancy. Calcific atherosclerotic disease of the aorta. Electronically Signed   By: DFidela SalisburyM.D.   On: 02/14/2017 13:03    Procedures Procedures (including critical care time)  Medications Ordered in ED Medications  iopamidol (ISOVUE-300) 61 % injection (not administered)  HYDROmorphone (DILAUDID) injection 1 mg (not administered)  HYDROmorphone (DILAUDID) injection 1 mg (1 mg Intravenous Given 02/14/17 0957)  ondansetron (ZOFRAN) injection 4 mg (4 mg Intravenous Given 02/14/17 0957)  HYDROmorphone (DILAUDID) injection 1 mg (1 mg Intravenous Given 02/14/17 1257)  iopamidol (ISOVUE-300) 61 % injection 100 mL (80 mLs Intravenous Contrast Given 02/14/17 1226)     Initial Impression / Assessment and  Plan / ED Course  I have reviewed the triage vital signs and the nursing notes.  Pertinent labs & imaging results that were available during my care of the patient were reviewed by me and considered in my medical decision making (see chart for details).  Pain has improved with treatment, but she is still tender.    Pt d/w Dr. KMaryland Pink([facility]) for admission.  He requested that I call surgery.  I spoke with Will JCreig Hines(PA gen surgery) who will come see pt.    I suspect pt will also need a GI work up for the mucosal thickening of the gastric body.  Final Clinical Impressions(s) / ED Diagnoses   Final diagnoses:  Generalized abdominal pain  Ileus (HCC)  SBO (  small bowel obstruction) ([place])  Nausea    ED Discharge Orders    None       Isla Pence, MD 02/14/17 1436

## 2017-02-14 NOTE — H&P (Addendum)
[facility] History and Physical  Erica [name] NFA:[id number] DOB: Aug 30, 1950 DOA: 02/14/2017   PCP: [name], MD  Specialists: Dr. Arelia Longest is her gastroenterologist  Chief Complaint: Abdominal pain with nausea  HPI: Erica [name] is a 66 y.o. female with a past medical history of right hemicolectomy many years ago for colon cancer, previous episodes of small bowel obstruction with the last episode being in 2010 for which she underwent surgery with lysis of adhesions.  She also has a history of COPD who continues to smoke cigarettes.  Has a history of Crohn's disease and Takotsubo's syndrome.  Patient was in her usual state of health last night when she developed abdominal pain with cramping.  The cramping got worse and then she had constant continuous pain which was 9 out of 10 in intensity.  She felt nauseated but did not have any vomiting.  Denies any fever or chills.  Last bowel movement was yesterday night which was loose which is her baseline.  Does not report any blood in the stool.  No recent worsening in her diarrhea.  She has not passed any gas from below since this morning.  She decided to come into the emergency department for further evaluation.  In the emergency department patient underwent CT scan which suggested early small bowel obstruction versus ileus.  She will need hospitalization for further management.  Home Medications: Prior to Admission medications   Medication Sig Start Date End Date Taking? Authorizing Provider  albuterol (PROVENTIL HFA;VENTOLIN HFA) 108 (90 Base) MCG/ACT inhaler Inhale 2 puffs daily and use as needed Q 4 hr for wheeze, SOB 11/03/16  Yes Burns, Claudina Lick, MD  calcium carbonate (CALCIUM 600) 600 MG TABS tablet Take 600 mg daily by mouth.    Yes [provider]  Diphenhydramine-PE-APAP (SEVERE COLD PO) Take 5 mLs every 6 (six) hours as needed by mouth (for cold).   Yes [provider]  ferrous sulfate 325 (65  FE) MG tablet Take 325 mg daily by mouth.   Yes [provider]  HYDROcodone-acetaminophen (NORCO/VICODIN) 5-325 MG tablet Take 1 tablet 2 (two) times daily as needed by mouth for moderate pain (pain). 02/08/17  Yes [name], MD  metoprolol tartrate (LOPRESSOR) 25 MG tablet Take 1 tablet (25 mg total) by mouth 2 (two) times daily. 01/12/17  Yes Belva Crome, MD  Probiotic Product (PROBIOTIC DAILY) CAPS Take 1 capsule daily by mouth.   Yes [provider]  vitamin B-12 (CYANOCOBALAMIN) 1000 MCG tablet Take 1,000 mcg by mouth daily.   Yes [provider]  Vitamin D, Ergocalciferol, (DRISDOL) 50000 units CAPS capsule Take 1 capsule (50,000 Units total) by mouth every 7 (seven) days. Patient taking differently: Take 50,000 Units every Wednesday by mouth.  08/30/16  Yes Lyndal Pulley, DO    Allergies:  Allergies  Allergen Reactions  . Morphine Nausea And Vomiting    Past Medical History: Past Medical History:  Diagnosis Date  . ADENOCARCINOMA, COLON, HX OF 03/2009  . ANEMIA   . BACK PAIN, LUMBAR   . Cancer [facility]) 2010   colon cancer  . CHF (congestive heart failure) ([place])   . COPD (chronic obstructive pulmonary disease) with emphysema ([place])   . Crohn's  02/2010   ileal ulcers, intol of entercort--refuses treatment  . GERD   . History of transfusion of whole blood   . HYPERLIPIDEMIA   . HYPERTENSION   . Microscopic hematuria    chronic  . Obstruction of intestine  or colon ([place])    adhesions  . OSTEOARTHRITIS   . Rectal fissure    Possible fissure  . Rotator cuff tear   . Takotsubo syndrome 12/2008  . URINARY INCONTINENCE     Past Surgical History:  Procedure Laterality Date  . ABDOMINAL HYSTERECTOMY  1994  . APPENDECTOMY  1964  . BREAST SURGERY    . CESAREAN SECTION     x 3  . CHOLECYSTECTOMY  1995  . COLON SURGERY  03/2009   hemicolectomy colon cancer  . COLONOSCOPY W/ BIOPSIES AND POLYPECTOMY  01/24/2011   (Crohn's)ileitis,  internal hemorrhoids  . HAND SURGERY  1991  . KNEE SURGERY Right 1981  . right knee replacement  03/2010  . TONSILLECTOMY  1952  . UPPER GASTROINTESTINAL ENDOSCOPY  05/16/2010   normal    Social History: Lives in [place].  Smokes 1 pack of cigarettes on a daily basis.  Very occasional alcohol use.  Denies any recreational drug use.  Family History:  Family History  Problem Relation Age of Onset  . Colon cancer Father 16  . Hypertension Father   . Heart disease Father   . Kidney disease Father   . Colon cancer Sister 51  . Throat cancer Mother   . Arthritis Other        Parent, other relative     Review of Systems - History obtained from the patient General ROS: positive for  - fatigue Psychological ROS: negative Ophthalmic ROS: negative ENT ROS: negative Allergy and Immunology ROS: negative Hematological and Lymphatic ROS: negative Endocrine ROS: negative Respiratory ROS: no cough, shortness of breath, or wheezing Cardiovascular ROS: no chest pain or dyspnea on exertion Gastrointestinal ROS: as in hpi Genito-Urinary ROS: no dysuria, trouble voiding, or hematuria Musculoskeletal ROS: negative Neurological ROS: no TIA or stroke symptoms Dermatological ROS: negative  Physical Examination  Vitals:   02/14/17 0817 02/14/17 1346  BP: (!) 132/91 119/79  Pulse: 79 64  Resp: 16 18  Temp: 98.2 F (36.8 C) 98 F (36.7 C)  TempSrc: Oral Oral  SpO2: 99% 96%  Weight: 54.4 kg (120 lb)   Height: 5' 2.5" (1.588 m)     BP 119/79 (BP Location: Right Arm)   Pulse 64   Temp 98 F (36.7 C) (Oral)   Resp 18   Ht 5' 2.5" (1.588 m)   Wt 54.4 kg (120 lb)   SpO2 96%   BMI 21.60 kg/m   General appearance: alert, cooperative, appears stated age and no distress Head: Normocephalic, without obvious abnormality, atraumatic Eyes: conjunctivae/corneas clear. PERRL, EOM's intact. Throat: lips, mucosa, and tongue normal; teeth and gums normal Neck: no adenopathy, no carotid  bruit, no JVD, supple, symmetrical, trachea midline and thyroid not enlarged, symmetric, no tenderness/mass/nodules Resp: clear to auscultation bilaterally Cardio: regular rate and rhythm, S1, S2 normal, no murmur, click, rub or gallop GI: Abdomen is soft soft diffusely tender without any rebound rigidity or guarding.  Bowel sounds sluggish but present.  No masses organomegaly. Extremities: extremities normal, atraumatic, no cyanosis or edema Pulses: 2+ and symmetric Skin: Skin color, texture, turgor normal. No rashes or lesions Lymph nodes: Cervical, supraclavicular, and axillary nodes normal. Neurologic: No focal deficits   Labs on Admission: I have personally reviewed following labs and imaging studies  CBC: Recent Labs  Lab 02/14/17 0946  WBC 11.0*  NEUTROABS 6.8  HGB 14.6  HCT 44.0  MCV 96.1  PLT 923   Basic Metabolic Panel: Recent Labs  Lab 02/14/17 0946  NA 138  K 4.4  CL 106  CO2 24  GLUCOSE 94  BUN 14  CREATININE 0.75  CALCIUM 10.0   GFR: Estimated Creatinine Clearance: 56 mL/min (by C-G formula based on SCr of 0.75 mg/dL). Liver Function Tests: Recent Labs  Lab 02/14/17 0946  AST 18  ALT 15  ALKPHOS 59  BILITOT 0.6  PROT 7.6  ALBUMIN 4.5   Recent Labs  Lab 02/14/17 0946  LIPASE 31     Radiological Exams on Admission: Ct Abdomen Pelvis W Contrast  Result Date: 02/14/2017 CLINICAL DATA:  Abdominal pain. History of bowel obstructions. History of colon cancer post right hemi colectomy and Crohn's disease. EXAM: CT ABDOMEN AND PELVIS WITH CONTRAST TECHNIQUE: Multidetector CT imaging of the abdomen and pelvis was performed using the standard protocol following bolus administration of intravenous contrast. CONTRAST:  44m ISOVUE-300 IOPAMIDOL (ISOVUE-300) INJECTION 61% COMPARISON:  06/28/2015 FINDINGS: Lower chest: No acute abnormality. Hepatobiliary: No focal liver abnormality is seen. Status post cholecystectomy. No biliary dilatation. Pancreas:  Unremarkable. No pancreatic ductal dilatation or surrounding inflammatory changes. Spleen: Normal in size without focal abnormality. Adrenals/Urinary Tract: Normal right adrenal gland. Hypoattenuated mass within the left adrenal measures 2.3 cm, stable. No evidence of hydronephrosis or renal calculi. Bilateral scattered too small to be actually characterize hypoattenuated lesions throughout the kidneys. Stomach/Bowel: Diffuse circumferential thickening of the gastric mucosa in the gastric cardia and body. Post right hemicolectomy. Borderline dilated gas and fluid-filled small bowel loops within the central lower abdomen with maximum diameter of 3.6 cm. Transitional point is seen in the right hemipelvis, image 53/29/sequence 2. No obstructing mass is seen. The residual colon demonstrates normal pattern. Vascular/Lymphatic: Aortic atherosclerosis. No enlarged abdominal or pelvic lymph nodes. Reproductive: Status post hysterectomy. No adnexal masses. Other: No evidence of free gas in the abdomen. Small amount of free fluid in the right hemipelvis. Musculoskeletal: Degenerative disc disease and posterior facet arthropathy in the lower lumbosacral spine. IMPRESSION: Early/incomplete small bowel obstruction versus ileus with transitional point in the right hemipelvis. No obstructing masses seen. Persistent stable in size indeterminate left adrenal mass. Nonspecific diffuse mucosal thickening of the gastric body and cardia. This may represent inflammatory/infectious changes, hypertrophy or potentially infiltrating malignancy. Calcific atherosclerotic disease of the aorta. Electronically Signed   By: DFidela SalisburyM.D.   On: 02/14/2017 13:03      Problem List  Principal Problem:   SBO (small bowel obstruction) (HCC) Active Problems:   Essential hypertension   Takotsubo syndrome   COPD  GOLD II    Tobacco abuse   History of Crohn's disease   Assessment: This is a 66year old Caucasian female with a  past medical history as stated earlier who presents with abdominal pain and nausea since last night.  She is found to have a small bowel obstruction.  Patient has previous history of same.  She is status post right hemicolectomy in the past for colon cancer.  Last hospitalization was in 2010 when she underwent adhesional lysis.  She also has a history of Crohn's disease.  However this does not appear to be Crohn's exacerbation.  Plan: #1 Small bowel obstruction: Appears to be early based on CT scan.  She has not had any nausea vomiting.  She will be placed on bowel rest.  IV fluids.  If she starts having nausea or starts vomiting then NG tube will need to be placed.  General surgery to be consulted by emergency department physician.  Abd x-ray tomorrow morning.  #2  History  of Crohn's disease: Appears to be stable.  Continue to monitor.  #3  History of COPD and tobacco abuse: Patient unfortunately continues to smoke cigarettes.  She does not seem inclined to stop smoking currently.  She was counseled.  Nicotine patch.  #4  History of Takotsubo's syndrome: This was in the past.  Has been periodically seen by cardiology.  Last EF was normal in 2015. Stable.  #6  Essential hypertension: Monitor blood pressures closely.  Seems to be well controlled at this time.  Hydralazine as needed.   DVT Prophylaxis: Lovenox Code Status: Full code Family Communication: Discussed with the patient Consults called: EDP to call general surgery  Severity of Illness: The appropriate patient status for this patient is INPATIENT. Inpatient status is judged to be reasonable and necessary in order to provide the required intensity of service to ensure the patient's safety. The patient's presenting symptoms, physical exam findings, and initial radiographic and laboratory data in the context of their chronic comorbidities is felt to place them at high risk for further clinical deterioration. Furthermore, it is not  anticipated that the patient will be medically stable for discharge from the hospital within 2 midnights of admission. The following factors support the patient status of inpatient.   " The patient's presenting symptoms include nausea and abdominal pain. " The worrisome physical exam findings include abdominal tenderness. " The initial radiographic and laboratory data are worrisome because of small bowel obstruction. " The chronic co-morbidities include COPD.   * I certify that at the point of admission it is my clinical judgment that the patient will require inpatient hospital care spanning beyond 2 midnights from the point of admission due to high intensity of service, high risk for further deterioration and high frequency of surveillance required.*    Further management decisions will depend on results of further testing and patient's response to treatment.   [facility]  [facility] Pager [phone]  If 7PM-7AM, please contact night-coverage [email or web address] Password [email or web address]  02/14/2017, 2:12 PM

## 2017-02-14 NOTE — ED Notes (Signed)
ED TO INPATIENT HANDOFF REPORT  Name/Age/Gender Erica [name] 66 y.o. female  Code Status Code Status History    Date Active Date Inactive Code Status Order ID Comments User Context   08/16/2015 02:41 08/16/2015 21:32 Full Code [id number]  Edwin Dada, MD Inpatient      Home/SNF/Other Home  Chief Complaint Abd Pain  Level of Care/Admitting Diagnosis ED Disposition    ED Disposition Condition [facility] Area: [facility] [[id number]]  Level of Care: Med-Surg [16]  Diagnosis: SBO (small bowel obstruction) [facility]) [[id number]]  Admitting Physician: Bonnielee Haff [[id number]]  Attending Physician: Bonnielee Haff [[id number]]  Estimated length of stay: past midnight tomorrow  Certification:: I certify this patient will need inpatient services for at least 2 midnights  PT Class (Do Not Modify): Inpatient [101]  PT Acc Code (Do Not Modify): Private [1]       Medical History Past Medical History:  Diagnosis Date  . ADENOCARCINOMA, COLON, HX OF 03/2009  . ANEMIA   . BACK PAIN, LUMBAR   . Cancer [facility]) 2010   colon cancer  . CHF (congestive heart failure) ([place])   . COPD (chronic obstructive pulmonary disease) with emphysema ([place])   . Crohn's  02/2010   ileal ulcers, intol of entercort--refuses treatment  . GERD   . History of transfusion of whole blood   . HYPERLIPIDEMIA   . HYPERTENSION   . Microscopic hematuria    chronic  . Obstruction of intestine or colon (HCC)    adhesions  . OSTEOARTHRITIS   . Rectal fissure    Possible fissure  . Rotator cuff tear   . Takotsubo syndrome 12/2008  . URINARY INCONTINENCE     Allergies Allergies  Allergen Reactions  . Morphine Nausea And Vomiting    IV Location/Drains/Wounds Patient Lines/Drains/Airways Status   Active Line/Drains/Airways    Name:   Placement date:   Placement time:   Site:   Days:   Peripheral IV 02/14/17 Left Forearm   02/14/17    0951    Forearm   less than 1           Labs/Imaging Results for orders placed or performed during the hospital encounter of 02/14/17 (from the past 48 hour(s))  CBC with Differential     Status: Abnormal   Collection Time: 02/14/17  9:46 AM  Result Value Ref Range   WBC 11.0 (H) 4.0 - 10.5 K/uL   RBC 4.58 3.87 - 5.11 MIL/uL   Hemoglobin 14.6 12.0 - 15.0 g/dL   HCT 44.0 36.0 - 46.0 %   MCV 96.1 78.0 - 100.0 fL   MCH 31.9 26.0 - 34.0 pg   MCHC 33.2 30.0 - 36.0 g/dL   RDW 12.7 11.5 - 15.5 %   Platelets 303 150 - 400 K/uL   Neutrophils Relative % 61 %   Neutro Abs 6.8 1.7 - 7.7 K/uL   Lymphocytes Relative 31 %   Lymphs Abs 3.4 0.7 - 4.0 K/uL   Monocytes Relative 6 %   Monocytes Absolute 0.7 0.1 - 1.0 K/uL   Eosinophils Relative 1 %   Eosinophils Absolute 0.1 0.0 - 0.7 K/uL   Basophils Relative 1 %   Basophils Absolute 0.1 0.0 - 0.1 K/uL  Comprehensive metabolic panel     Status: None   Collection Time: 02/14/17  9:46 AM  Result Value Ref Range   Sodium 138 135 - 145 mmol/L   Potassium 4.4 3.5 - 5.1 mmol/L  Chloride 106 101 - 111 mmol/L   CO2 24 22 - 32 mmol/L   Glucose, Bld 94 65 - 99 mg/dL   BUN 14 6 - 20 mg/dL   Creatinine, Ser 0.75 0.44 - 1.00 mg/dL   Calcium 10.0 8.9 - 10.3 mg/dL   Total Protein 7.6 6.5 - 8.1 g/dL   Albumin 4.5 3.5 - 5.0 g/dL   AST 18 15 - 41 U/L   ALT 15 14 - 54 U/L   Alkaline Phosphatase 59 38 - 126 U/L   Total Bilirubin 0.6 0.3 - 1.2 mg/dL   GFR calc non Af Amer >60 >60 mL/min   GFR calc Af Amer >60 >60 mL/min    Comment: (NOTE) The eGFR has been calculated using the CKD EPI equation. This calculation has not been validated in all clinical situations. eGFR's persistently <60 mL/min signify possible Chronic Kidney Disease.    Anion gap 8 5 - 15  Lipase, blood     Status: None   Collection Time: 02/14/17  9:46 AM  Result Value Ref Range   Lipase 31 11 - 51 U/L  Urinalysis, Routine w reflex microscopic     Status: Abnormal   Collection Time: 02/14/17  9:47 AM  Result Value  Ref Range   Color, Urine YELLOW YELLOW   APPearance CLEAR CLEAR   Specific Gravity, Urine 1.009 1.005 - 1.030   pH 5.0 5.0 - 8.0   Glucose, UA NEGATIVE NEGATIVE mg/dL   Hgb urine dipstick MODERATE (A) NEGATIVE   Bilirubin Urine NEGATIVE NEGATIVE   Ketones, ur NEGATIVE NEGATIVE mg/dL   Protein, ur NEGATIVE NEGATIVE mg/dL   Nitrite NEGATIVE NEGATIVE   Leukocytes, UA NEGATIVE NEGATIVE   RBC / HPF 0-5 0 - 5 RBC/hpf   WBC, UA 0-5 0 - 5 WBC/hpf   Bacteria, UA RARE (A) NONE SEEN   Squamous Epithelial / LPF 0-5 (A) NONE SEEN   Mucus PRESENT    Ct Abdomen Pelvis W Contrast  Result Date: 02/14/2017 CLINICAL DATA:  Abdominal pain. History of bowel obstructions. History of colon cancer post right hemi colectomy and Crohn's disease. EXAM: CT ABDOMEN AND PELVIS WITH CONTRAST TECHNIQUE: Multidetector CT imaging of the abdomen and pelvis was performed using the standard protocol following bolus administration of intravenous contrast. CONTRAST:  65m ISOVUE-300 IOPAMIDOL (ISOVUE-300) INJECTION 61% COMPARISON:  06/28/2015 FINDINGS: Lower chest: No acute abnormality. Hepatobiliary: No focal liver abnormality is seen. Status post cholecystectomy. No biliary dilatation. Pancreas: Unremarkable. No pancreatic ductal dilatation or surrounding inflammatory changes. Spleen: Normal in size without focal abnormality. Adrenals/Urinary Tract: Normal right adrenal gland. Hypoattenuated mass within the left adrenal measures 2.3 cm, stable. No evidence of hydronephrosis or renal calculi. Bilateral scattered too small to be actually characterize hypoattenuated lesions throughout the kidneys. Stomach/Bowel: Diffuse circumferential thickening of the gastric mucosa in the gastric cardia and body. Post right hemicolectomy. Borderline dilated gas and fluid-filled small bowel loops within the central lower abdomen with maximum diameter of 3.6 cm. Transitional point is seen in the right hemipelvis, image 53/29/sequence 2. No  obstructing mass is seen. The residual colon demonstrates normal pattern. Vascular/Lymphatic: Aortic atherosclerosis. No enlarged abdominal or pelvic lymph nodes. Reproductive: Status post hysterectomy. No adnexal masses. Other: No evidence of free gas in the abdomen. Small amount of free fluid in the right hemipelvis. Musculoskeletal: Degenerative disc disease and posterior facet arthropathy in the lower lumbosacral spine. IMPRESSION: Early/incomplete small bowel obstruction versus ileus with transitional point in the right hemipelvis. No obstructing masses seen. Persistent  stable in size indeterminate left adrenal mass. Nonspecific diffuse mucosal thickening of the gastric body and cardia. This may represent inflammatory/infectious changes, hypertrophy or potentially infiltrating malignancy. Calcific atherosclerotic disease of the aorta. Electronically Signed   By: Fidela Salisbury M.D.   On: 02/14/2017 13:03    Pending Labs [employer] (From admission, onward)   Start     Ordered   Signed and Occupational hygienist morning,   R     Signed and Held   Signed and Held  CBC  Tomorrow morning,   R     Signed and Held      Vitals/Pain Today's Vitals   02/14/17 0823 02/14/17 1005 02/14/17 1257 02/14/17 1346  BP:    119/79  Pulse:    64  Resp:    18  Temp:    98 F (36.7 C)  TempSrc:    Oral  SpO2:    96%  Weight:      Height:      PainSc: _0 Isolation Precautions No active isolations  Medications Medications  iopamidol (ISOVUE-300) 61 % injection (not administered)  HYDROmorphone (DILAUDID) injection 1 mg (not administered)  HYDROmorphone (DILAUDID) injection 1 mg (1 mg Intravenous Given 02/14/17 0957)  ondansetron (ZOFRAN) injection 4 mg (4 mg Intravenous Given 02/14/17 0957)  HYDROmorphone (DILAUDID) injection 1 mg (1 mg Intravenous Given 02/14/17 1257)  iopamidol (ISOVUE-300) 61 % injection 100 mL (80 mLs Intravenous Contrast Given 02/14/17 1226)     Mobility walks with device

## 2017-02-14 NOTE — Consult Note (Signed)
Reason for Consult: SBO versus ileus Referring Physician: Dr. Curly Rim GI: Dr. Silvano Rusk CC: Abdominal pain since last evening.  Erica [name] is an 66 y.o. female.    HPI: Patient is a 66 year old female with a history of Crohn's disease who presents with what she thinks is a small bowel obstruction. She does not take any Crohn's medicines, and controls it with diet.  Patient has a history of multiple small bowel obstructions she is having nausea but no vomiting.  Abdominal pain and distention started last evening. She was fine yesterday, last BM was yesterday, no constipation, soft to loose stools with Crohn's and right hemicolectomy.   Patient has a hx of Takotsubo's syndrome, COPD with ongoing tobacco use.  History of colon cancer, with prior abdominal hysterectomy appendectomy C-section x3 cholecystectomy hemicolectomy in 2010.  She also reports a lysis of adhesions for SBO.  She cannot remember the date, I cannot find any records in epic on surgery by our group in [place].  Workup in the ED shows she was mildly hypertensive on admission.  Other vital signs are stable.  CMP is normal.  WBC is 11,000 and hemoglobin 14, hematocrit 44, platelets 303,000.  Urinalysis is normal.  CT of the abdomen was obtained.  Findings include a 2.3 cm left adrenal mass.  Diffuse circumferential thickening of the gastric mucosa in the gastric cardia and body.  Status post right hemicolectomy, borderline dilated gas and fluid-filled bowel loops within the central lower abdominal abdomen with a maximum diameter of 3.6 cm.  A transition point is seen in the right hemipelvis.  No obstructing mass was seen.  The residual colon demonstrates a normal pattern.  No evidence of free air in the abdomen.  There is a small amount of fluid in the right hemipelvis.  The conclusion was early incomplete small bowel obstruction versus ileus with a transition point in the right hemipelvis no obstructing masses seen.  She also  has a persistent stable size indeterminate left adrenal mass.  We are asked to see.  Past Medical History:  Diagnosis Date  . ADENOCARCINOMA, COLON, HX OF 03/2009  . ANEMIA   . BACK PAIN, LUMBAR   . Cancer [facility]) 2010   colon cancer  . CHF (congestive heart failure) ([place])   . COPD (chronic obstructive pulmonary disease) with emphysema ([place])   . Crohn's  02/2010   ileal ulcers, intol of entercort--refuses treatment  . GERD   . History of transfusion of whole blood   . HYPERLIPIDEMIA   . HYPERTENSION   . Microscopic hematuria    chronic  . Obstruction of intestine or colon (HCC)    adhesions  . OSTEOARTHRITIS   . Rectal fissure    Possible fissure  . Rotator cuff tear   . Takotsubo syndrome 12/2008  . URINARY INCONTINENCE     Past Surgical History:  Procedure Laterality Date  . ABDOMINAL HYSTERECTOMY  1994  . APPENDECTOMY  1964  . BREAST SURGERY    . CESAREAN SECTION     x 3  . CHOLECYSTECTOMY  1995  . COLON SURGERY  03/2009   hemicolectomy colon cancer  . COLONOSCOPY W/ BIOPSIES AND POLYPECTOMY  01/24/2011   (Crohn's)ileitis, internal hemorrhoids  . HAND SURGERY  1991  . KNEE SURGERY Right 1981  . right knee replacement  03/2010  . TONSILLECTOMY  1952  . UPPER GASTROINTESTINAL ENDOSCOPY  05/16/2010   normal    Family History  Problem Relation Age of Onset  . Colon  cancer Father 63  . Hypertension Father   . Heart disease Father   . Kidney disease Father   . Colon cancer Sister 28  . Throat cancer Mother   . Arthritis Other        Parent, other relative    Social History:  reports that she has been smoking cigarettes.  She has a 50.00 pack-year smoking history. she has never used smokeless tobacco. She reports that she does not drink alcohol or use drugs.  Allergies:  Allergies  Allergen Reactions  . Morphine Nausea And Vomiting    Prior to Admission medications   Medication Sig Start Date End Date Taking? Authorizing Provider  albuterol (PROVENTIL  HFA;VENTOLIN HFA) 108 (90 Base) MCG/ACT inhaler Inhale 2 puffs daily and use as needed Q 4 hr for wheeze, SOB 11/03/16  Yes Burns, Claudina Lick, MD  calcium carbonate (CALCIUM 600) 600 MG TABS tablet Take 600 mg daily by mouth.    Yes [provider]  Diphenhydramine-PE-APAP (SEVERE COLD PO) Take 5 mLs every 6 (six) hours as needed by mouth (for cold).   Yes [provider]  ferrous sulfate 325 (65 FE) MG tablet Take 325 mg daily by mouth.   Yes [provider]  HYDROcodone-acetaminophen (NORCO/VICODIN) 5-325 MG tablet Take 1 tablet 2 (two) times daily as needed by mouth for moderate pain (pain). 02/08/17  Yes [name], MD  metoprolol tartrate (LOPRESSOR) 25 MG tablet Take 1 tablet (25 mg total) by mouth 2 (two) times daily. 01/12/17  Yes Belva Crome, MD  Probiotic Product (PROBIOTIC DAILY) CAPS Take 1 capsule daily by mouth.   Yes [provider]  vitamin B-12 (CYANOCOBALAMIN) 1000 MCG tablet Take 1,000 mcg by mouth daily.   Yes [provider]  Vitamin D, Ergocalciferol, (DRISDOL) 50000 units CAPS capsule Take 1 capsule (50,000 Units total) by mouth every 7 (seven) days. Patient taking differently: Take 50,000 Units every Wednesday by mouth.  08/30/16  Yes Lyndal Pulley, DO    Anti-infectives (From admission, onward)   None      Results for orders placed or performed during the hospital encounter of 02/14/17 (from the past 48 hour(s))  CBC with Differential     Status: Abnormal   Collection Time: 02/14/17  9:46 AM  Result Value Ref Range   WBC 11.0 (H) 4.0 - 10.5 K/uL   RBC 4.58 3.87 - 5.11 MIL/uL   Hemoglobin 14.6 12.0 - 15.0 g/dL   HCT 44.0 36.0 - 46.0 %   MCV 96.1 78.0 - 100.0 fL   MCH 31.9 26.0 - 34.0 pg   MCHC 33.2 30.0 - 36.0 g/dL   RDW 12.7 11.5 - 15.5 %   Platelets 303 150 - 400 K/uL   Neutrophils Relative % 61 %   Neutro Abs 6.8 1.7 - 7.7 K/uL   Lymphocytes Relative 31 %   Lymphs Abs 3.4 0.7 - 4.0 K/uL   Monocytes  Relative 6 %   Monocytes Absolute 0.7 0.1 - 1.0 K/uL   Eosinophils Relative 1 %   Eosinophils Absolute 0.1 0.0 - 0.7 K/uL   Basophils Relative 1 %   Basophils Absolute 0.1 0.0 - 0.1 K/uL  Comprehensive metabolic panel     Status: None   Collection Time: 02/14/17  9:46 AM  Result Value Ref Range   Sodium 138 135 - 145 mmol/L   Potassium 4.4 3.5 - 5.1 mmol/L   Chloride 106 101 - 111 mmol/L   CO2 24 22 -  32 mmol/L   Glucose, Bld 94 65 - 99 mg/dL   BUN 14 6 - 20 mg/dL   Creatinine, Ser 0.75 0.44 - 1.00 mg/dL   Calcium 10.0 8.9 - 10.3 mg/dL   Total Protein 7.6 6.5 - 8.1 g/dL   Albumin 4.5 3.5 - 5.0 g/dL   AST 18 15 - 41 U/L   ALT 15 14 - 54 U/L   Alkaline Phosphatase 59 38 - 126 U/L   Total Bilirubin 0.6 0.3 - 1.2 mg/dL   GFR calc non Af Amer >60 >60 mL/min   GFR calc Af Amer >60 >60 mL/min    Comment: (NOTE) The eGFR has been calculated using the CKD EPI equation. This calculation has not been validated in all clinical situations. eGFR's persistently <60 mL/min signify possible Chronic Kidney Disease.    Anion gap 8 5 - 15  Lipase, blood     Status: None   Collection Time: 02/14/17  9:46 AM  Result Value Ref Range   Lipase 31 11 - 51 U/L  Urinalysis, Routine w reflex microscopic     Status: Abnormal   Collection Time: 02/14/17  9:47 AM  Result Value Ref Range   Color, Urine YELLOW YELLOW   APPearance CLEAR CLEAR   Specific Gravity, Urine 1.009 1.005 - 1.030   pH 5.0 5.0 - 8.0   Glucose, UA NEGATIVE NEGATIVE mg/dL   Hgb urine dipstick MODERATE (A) NEGATIVE   Bilirubin Urine NEGATIVE NEGATIVE   Ketones, ur NEGATIVE NEGATIVE mg/dL   Protein, ur NEGATIVE NEGATIVE mg/dL   Nitrite NEGATIVE NEGATIVE   Leukocytes, UA NEGATIVE NEGATIVE   RBC / HPF 0-5 0 - 5 RBC/hpf   WBC, UA 0-5 0 - 5 WBC/hpf   Bacteria, UA RARE (A) NONE SEEN   Squamous Epithelial / LPF 0-5 (A) NONE SEEN   Mucus PRESENT     Ct Abdomen Pelvis W Contrast  Result Date: 02/14/2017 CLINICAL DATA:  Abdominal  pain. History of bowel obstructions. History of colon cancer post right hemi colectomy and Crohn's disease. EXAM: CT ABDOMEN AND PELVIS WITH CONTRAST TECHNIQUE: Multidetector CT imaging of the abdomen and pelvis was performed using the standard protocol following bolus administration of intravenous contrast. CONTRAST:  35m ISOVUE-300 IOPAMIDOL (ISOVUE-300) INJECTION 61% COMPARISON:  06/28/2015 FINDINGS: Lower chest: No acute abnormality. Hepatobiliary: No focal liver abnormality is seen. Status post cholecystectomy. No biliary dilatation. Pancreas: Unremarkable. No pancreatic ductal dilatation or surrounding inflammatory changes. Spleen: Normal in size without focal abnormality. Adrenals/Urinary Tract: Normal right adrenal gland. Hypoattenuated mass within the left adrenal measures 2.3 cm, stable. No evidence of hydronephrosis or renal calculi. Bilateral scattered too small to be actually characterize hypoattenuated lesions throughout the kidneys. Stomach/Bowel: Diffuse circumferential thickening of the gastric mucosa in the gastric cardia and body. Post right hemicolectomy. Borderline dilated gas and fluid-filled small bowel loops within the central lower abdomen with maximum diameter of 3.6 cm. Transitional point is seen in the right hemipelvis, image 53/29/sequence 2. No obstructing mass is seen. The residual colon demonstrates normal pattern. Vascular/Lymphatic: Aortic atherosclerosis. No enlarged abdominal or pelvic lymph nodes. Reproductive: Status post hysterectomy. No adnexal masses. Other: No evidence of free gas in the abdomen. Small amount of free fluid in the right hemipelvis. Musculoskeletal: Degenerative disc disease and posterior facet arthropathy in the lower lumbosacral spine. IMPRESSION: Early/incomplete small bowel obstruction versus ileus with transitional point in the right hemipelvis. No obstructing masses seen. Persistent stable in size indeterminate left adrenal mass. Nonspecific diffuse  mucosal thickening  of the gastric body and cardia. This may represent inflammatory/infectious changes, hypertrophy or potentially infiltrating malignancy. Calcific atherosclerotic disease of the aorta. Electronically Signed   By: Fidela Salisbury M.D.   On: 02/14/2017 13:03    Review of Systems  Constitutional: Negative.   HENT: Negative.   Eyes: Negative.   Respiratory: Negative.   Cardiovascular: Negative.   Gastrointestinal: Positive for abdominal pain and nausea. Negative for blood in stool, constipation, diarrhea, heartburn, melena and vomiting.       Chronic loose to soft stools.  Genitourinary: Negative.   Musculoskeletal: Negative.   Skin: Negative.   Neurological: Negative.   Endo/Heme/Allergies: Negative.   Psychiatric/Behavioral: Negative.    Blood pressure 119/79, pulse 64, temperature 98 F (36.7 C), temperature source Oral, resp. rate 18, height 5' 2.5" (1.588 m), weight 54.4 kg (120 lb), SpO2 96 %. Physical Exam  Constitutional: She is oriented to person, place, and time. No distress.  Thin cachectic female in no acute distress.  HENT:  Head: Normocephalic.  Nose: Nose normal.  Mouth/Throat: No oropharyngeal exudate.  Eyes: Right eye exhibits no discharge. Left eye exhibits no discharge. No scleral icterus.  Pupils are equal  Neck: Normal range of motion. Neck supple. No JVD present. No tracheal deviation present. No thyromegaly present.  GI: She exhibits distension (Some distention complains of ongoing abdominal discomfort.  Bowel sounds are somewhat hyperactive.  No peritonitis.). She exhibits no mass. There is no tenderness. There is no rebound and no guarding.  She has multiple scars lower abdomen midline and left and right abdominal incisions.  Musculoskeletal: She exhibits no edema or tenderness.  Lymphadenopathy:    She has no cervical adenopathy.  Neurological: She is alert and oriented to person, place, and time. No cranial nerve deficit.  Skin: Skin is  warm and dry. No rash noted. She is not diaphoretic. No erythema. No pallor.  Psychiatric: She has a normal mood and affect. Her behavior is normal. Judgment and thought content normal.    Assessment/Plan: Abdominal distention and nausea with probable recurrent small bowel obstruction.  History of colon cancer with right hemicolectomy/multiple abdominal surgeries. History of Crohn's disease/patient declines medical treatment for this/diet management Ongoing tobacco use/COPD History of GERD  history of hypertension History of hyperlipidemia  Plan: Patient is mildly distended nausea has been resolved pretty much with antiemetics.  She continues to have some discomfort but is not overly distended, no vomiting since onset of symptoms early this a.m.  At this point I would just watch her and treat conservatively.  Keep her n.p.o., IV hydration, will recheck the film in the a.m.  We can consider small bowel protocol also.  Also consider GI consult with Dr. Arelia Longest.  She reports she has never had a Crohn's flare.  Her current symptoms could in fact be from Crohn's or recurrent small bowel obstruction secondary to adhesions and her multiple surgeries. If she has nausea or vomiting would place an NG and place on LIWS.  Erica [name] 02/14/2017, 2:31 PM

## 2017-02-14 NOTE — ED Notes (Signed)
ED Provider at bedside. 

## 2017-02-15 ENCOUNTER — Inpatient Hospital Stay (HOSPITAL_COMMUNITY): Payer: Medicare Other

## 2017-02-15 LAB — BASIC METABOLIC PANEL
Anion gap: 3 — ABNORMAL LOW (ref 5–15)
BUN: 11 mg/dL (ref 6–20)
CO2: 26 mmol/L (ref 22–32)
Calcium: 8.8 mg/dL — ABNORMAL LOW (ref 8.9–10.3)
Chloride: 109 mmol/L (ref 101–111)
Creatinine, Ser: 0.79 mg/dL (ref 0.44–1.00)
GFR calc Af Amer: >60 mL/min (ref >60)
GFR calc non Af Amer: >60 mL/min (ref >60)
Glucose, Bld: 96 mg/dL (ref 65–99)
Potassium: 4.3 mmol/L (ref 3.5–5.1)
Sodium: 138 mmol/L (ref 135–145)

## 2017-02-15 LAB — CBC
HCT: 38.7 % (ref 36.0–46.0)
Hemoglobin: 12.2 g/dL (ref 12.0–15.0)
MCH: 30.7 pg (ref 26.0–34.0)
MCHC: 31.5 g/dL (ref 30.0–36.0)
MCV: 97.5 fL (ref 78.0–100.0)
Platelets: 224 10*3/uL (ref 150–400)
RBC: 3.97 MIL/uL (ref 3.87–5.11)
RDW: 12.7 % (ref 11.5–15.5)
WBC: 6.7 10*3/uL (ref 4.0–10.5)

## 2017-02-15 MED ORDER — LIP MEDEX EX OINT
TOPICAL_OINTMENT | CUTANEOUS | Status: DC | PRN
Start: 2017-02-15 — End: 2017-02-16

## 2017-02-15 MED ORDER — PHENOL 1.4 % MT LIQD
1.0000 | OROMUCOSAL | Status: DC | PRN
  Administered 2017-02-15: 1 via OROMUCOSAL
  Filled 2017-02-15 (×2): qty 177

## 2017-02-15 MED ORDER — LIP MEDEX EX OINT
TOPICAL_OINTMENT | CUTANEOUS | Status: AC
  Administered 2017-02-15: 1
  Filled 2017-02-15: qty 7

## 2017-02-15 MED ORDER — DIATRIZOATE MEGLUMINE & SODIUM 66-10 % PO SOLN
90.0000 mL | Freq: Once | ORAL | Status: AC
  Administered 2017-02-15: 90 mL via NASOGASTRIC
  Filled 2017-02-15: qty 90

## 2017-02-15 MED ORDER — PANTOPRAZOLE SODIUM 40 MG IV SOLR
40.0000 mg | Freq: Two times a day (BID) | INTRAVENOUS | Status: DC
  Administered 2017-02-15 – 2017-02-16 (×3): 40 mg via INTRAVENOUS
  Filled 2017-02-15 (×3): qty 40

## 2017-02-15 MED ORDER — METOPROLOL TARTRATE 5 MG/5ML IV SOLN: 2.5000 mg | INTRAVENOUS | Status: DC | PRN

## 2017-02-15 NOTE — Progress Notes (Signed)
[facility] Progress Note     Subjective: CC:  Endorses mild abdominal pain over right abdomen. Improved by pain meds. Endorses nausea and small volume bilious emesis this AM. Denies flatus or BM. Reports chronic low back pain.  Objective: Vital signs in last 24 hours: Temp:  [98 F (36.7 C)-98.6 F (37 C)] 98.6 F (37 C) (11/15 0546) Pulse Rate:  [63-92] 92 (11/15 0546) Resp:  [16-18] 18 (11/15 0546) BP: (112-133)/(66-91) 133/71 (11/15 0546) SpO2:  [91 %-99 %] 94 % (11/15 0546) Weight:  [54.4 kg (120 lb)] 54.4 kg (120 lb) (11/14 0817)    Intake/Output from previous day: 11/14 0701 - 11/15 0700 In: 900 [I.V.:900] Out: 600 [Urine:600] Intake/Output this shift: No intake/output data recorded.  PE: Gen:  Alert, NAD, cooperative Card:  Regular rate and rhythm, pedal pulses 2+ BL Pulm:  Normal effort, clear to auscultation bilaterally Abd: Soft, mild TTP central and right lower abdomen, hypoactive BS. No hernias. Previous laparotomy scar notes. Skin: warm and dry, no rashes  Psych: A&Ox3   Lab Results:  Recent Labs    02/14/17 0946 02/15/17 0447  WBC 11.0* 6.7  HGB 14.6 12.2  HCT 44.0 38.7  PLT 303 224   BMET Recent Labs    02/14/17 0946 02/15/17 0447  NA 138 138  K 4.4 4.3  CL 106 109  CO2 24 26  GLUCOSE 94 96  BUN 14 11  CREATININE 0.75 0.79  CALCIUM 10.0 8.8*   PT/INR No results for input(s): LABPROT, INR in the last 72 hours. CMP     Component Value Date/Time   NA 138 02/15/2017 0447   K 4.3 02/15/2017 0447   CL 109 02/15/2017 0447   CO2 26 02/15/2017 0447   GLUCOSE 96 02/15/2017 0447   BUN 11 02/15/2017 0447   CREATININE 0.79 02/15/2017 0447   CALCIUM 8.8 (L) 02/15/2017 0447   PROT 7.6 02/14/2017 0946   ALBUMIN 4.5 02/14/2017 0946   AST 18 02/14/2017 0946   ALT 15 02/14/2017 0946   ALKPHOS 59 02/14/2017 0946   BILITOT 0.6 02/14/2017 0946   GFRNONAA >60 02/15/2017 0447   GFRAA >60 02/15/2017 0447   Lipase     Component  Value Date/Time   LIPASE 31 02/14/2017 0946   Studies/Results: Ct Abdomen Pelvis W Contrast  Result Date: 02/14/2017 CLINICAL DATA:  Abdominal pain. History of bowel obstructions. History of colon cancer post right hemi colectomy and Crohn's disease. EXAM: CT ABDOMEN AND PELVIS WITH CONTRAST TECHNIQUE: Multidetector CT imaging of the abdomen and pelvis was performed using the standard protocol following bolus administration of intravenous contrast. CONTRAST:  57m ISOVUE-300 IOPAMIDOL (ISOVUE-300) INJECTION 61% COMPARISON:  06/28/2015 FINDINGS: Lower chest: No acute abnormality. Hepatobiliary: No focal liver abnormality is seen. Status post cholecystectomy. No biliary dilatation. Pancreas: Unremarkable. No pancreatic ductal dilatation or surrounding inflammatory changes. Spleen: Normal in size without focal abnormality. Adrenals/Urinary Tract: Normal right adrenal gland. Hypoattenuated mass within the left adrenal measures 2.3 cm, stable. No evidence of hydronephrosis or renal calculi. Bilateral scattered too small to be actually characterize hypoattenuated lesions throughout the kidneys. Stomach/Bowel: Diffuse circumferential thickening of the gastric mucosa in the gastric cardia and body. Post right hemicolectomy. Borderline dilated gas and fluid-filled small bowel loops within the central lower abdomen with maximum diameter of 3.6 cm. Transitional point is seen in the right hemipelvis, image 53/29/sequence 2. No obstructing mass is seen. The residual colon demonstrates normal pattern. Vascular/Lymphatic: Aortic atherosclerosis. No enlarged abdominal or pelvic lymph nodes.  Reproductive: Status post hysterectomy. No adnexal masses. Other: No evidence of free gas in the abdomen. Small amount of free fluid in the right hemipelvis. Musculoskeletal: Degenerative disc disease and posterior facet arthropathy in the lower lumbosacral spine. IMPRESSION: Early/incomplete small bowel obstruction versus ileus with  transitional point in the right hemipelvis. No obstructing masses seen. Persistent stable in size indeterminate left adrenal mass. Nonspecific diffuse mucosal thickening of the gastric body and cardia. This may represent inflammatory/infectious changes, hypertrophy or potentially infiltrating malignancy. Calcific atherosclerotic disease of the aorta. Electronically Signed   By: Fidela Salisbury M.D.   On: 02/14/2017 13:03    Anti-infectives: Anti-infectives (From admission, onward)   None     Assessment/Plan History of colon cancer with right hemicolectomy/multiple abdominal surgeries. History of Crohn's disease/patient declines medical treatment for this/diet management Ongoing tobacco use/COPD History of GERD  history of hypertension History of hyperlipidemia  Abdominal distention and nausea with probable recurrent small bowel obstruction - afebrile, VSS - CT Abd Pelvis 11/14 w/ Early/incomplete small bowel obstruction versus ileus with transitional point in the right hemipelvis. No obstructing masses. - DG Abd Pending. No clinical improvement this AM - nauseated, no bowel function. Will order NGT and small bowel protocol. - mobilize   FEN: NPO, IVF, place NG tube.  ID: none VTE: SCD's, Lovenox  Foley: none    LOS: 1 day    [facility] 02/15/2017, 7:48 AM Pager: [phone] Consults: [phone] Mon-Fri 7:00 am-4:30 pm Sat-Sun 7:00 am-11:30 am

## 2017-02-15 NOTE — Progress Notes (Signed)
Initial Nutrition Assessment  INTERVENTION:   Diet advancement per MD Will address nutritional needs once diet is advanced.  NUTRITION DIAGNOSIS:   Inadequate oral intake related to inability to eat as evidenced by NPO status.  GOAL:   Patient will meet greater than or equal to 90% of their needs  MONITOR:   Diet advancement, Labs, Weight trends, I & O's  REASON FOR ASSESSMENT:   Malnutrition Screening Tool   ASSESSMENT:    66 y.o. female with a past medical history of right hemicolectomy many years ago for colon cancer, previous episodes of small bowel obstruction with the last episode being in 2010 for which she underwent surgery with lysis of adhesions.  She also has a history of COPD who continues to smoke cigarettes.  Has a history of Crohn's disease and Takotsubo's syndrome.  Patient was in her usual state of health last night when she developed abdominal pain with cramping.  The cramping got worse and then she had constant continuous pain which was 9 out of 10 in intensity.  She felt nauseated but did not have any vomiting.  Denies any fever or chills.  Last bowel movement was yesterday night which was loose which is her baseline.  Does not report any blood in the stool.  No recent worsening in her diarrhea.  She has not passed any gas from below since this morning.  She decided to come into the emergency department for further evaluation.  Patient currently NPO, on bowel rest for SBO. NGT was placed today. Pt reports losing 50 lb in the last month. Per weight records in chart, pt has lost 40 lb since 08/16/15. Insignificant for time frame. Pt had episodes of emesis and nausea this morning. Will address nutrition needs once diet is advanced.   Labs reviewed. Medications: IV Zofran PRN  NUTRITION - FOCUSED PHYSICAL EXAM:  Will complete at follow-up.  Diet Order:  Diet NPO time specified  EDUCATION NEEDS:   No education needs have been identified at this time  Skin:  Skin  Assessment: Reviewed RN Assessment  Last BM:  11/13  Height:   Ht Readings from Last 1 Encounters:  02/14/17 5' 2.5" (1.588 m)    Weight:   Wt Readings from Last 1 Encounters:  02/14/17 120 lb (54.4 kg)    Ideal Body Weight:  52.3 kg  BMI:  Body mass index is 21.6 kg/m.  Estimated Nutritional Needs:   Kcal:  1400-1600  Protein:  65-75g  Fluid:  1.6L/day  Erica Bibles, MS, RD, LDN [facility] Pager: [phone] After Hours Pager: [phone]

## 2017-02-15 NOTE — Progress Notes (Signed)
PROGRESS NOTE  Erica [name] AYO:[id number] DOB: 1950/05/11 DOA: 02/14/2017 PCP: [name], MD  HPI/Recap of past 24 hours:  S/p ng placement, currently ng clamped per small bowel protocol She c/o feeling nauseous No fever, no leukocytosis She has chronic productive cough with whitish sputum, no chest pain, no hypoxia, no wheezing, no edema  Assessment/Plan: Principal Problem:   SBO (small bowel obstruction) ([place]) Active Problems:   Essential hypertension   Takotsubo syndrome   COPD  GOLD II    Tobacco abuse   History of Crohn's disease   Recurrent Small bowel obstruction:  -H/o colon cancer s/p right hemicolectomy- h/o sbo in 2010 required lysis of adhesions.  -s/p NG placement, on small bowel protocol. 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  she reports intermittent diarrhea but it dose not bother her too much. Appears to be stable.  Continue to monitor.   H/o HTN:  Home meds lopressor held due to npo bp stable, on prn iv lopressor with parameters.  History of Takotsubo's syndrome: This was in the past.  Has been periodically seen by cardiology.  Last EF was normal in 2015. Stable.  History of COPD and ongoing tobacco abuse:  Patient unfortunately continues to smoke cigarettes.  She does not seem inclined to stop smoking currently.  She was counseled.  Nicotine patch. No wheezing on exam, she does has chronic productive cough, monitor She is not on any maintenance copd meds at home, continue prn  albuterol   Weight loss since all her teeth removed, now has dentures. Weight seems stablized.    Code Status: full  Family Communication: patient   Disposition Plan: remain in the hospital   Consultants:  General surgery  Procedures:  Ng insertion   Antibiotics:  none   Objective: BP 133/71 (BP Location: Right Arm)   Pulse 92   Temp 98.6 F (37 C) (Oral)   Resp 18   Ht 5' 2.5" (1.588 m)   Wt 54.4 kg (120 lb)   SpO2 94%   BMI 21.60 kg/m   Intake/Output Summary (Last 24 hours) at 02/15/2017 0959 Last data filed at 02/15/2017 0800 Gross per 24 hour  Intake 900 ml  Output 600 ml  Net 300 ml   Filed Weights   02/14/17 0817  Weight: 54.4 kg (120 lb)    Exam: Patient is examined daily including today on 02/15/2017, exams remain the same as of yesterday except that has changed    General:  Feeling nauseous, ng clamped, thin  Cardiovascular: RRR  Respiratory: diminished, no wheezing, no rales, no rhonchi  Abdomen:  Mild tender, hypoactive BS, no guarding, no rebound, well healed old surgical scar.  Musculoskeletal: No Edema  Neuro: alert, oriented   Data Reviewed: Basic Metabolic Panel: Recent Labs  Lab 02/14/17 0946 02/15/17 0447  NA 138 138  K 4.4 4.3  CL 106 109  CO2 24 26  GLUCOSE 94 96  BUN 14 11  CREATININE 0.75 0.79  CALCIUM 10.0 8.8*   Liver Function Tests: Recent Labs  Lab 02/14/17 0946  AST 18  ALT 15  ALKPHOS 59  BILITOT 0.6  PROT 7.6  ALBUMIN 4.5   Recent Labs  Lab 02/14/17 0946  LIPASE 31   No results for input(s): AMMONIA in the last 168 hours. CBC: Recent Labs  Lab 02/14/17 0946 02/15/17 0447  WBC 11.0* 6.7  NEUTROABS 6.8  --   HGB 14.6 12.2  HCT 44.0 38.7  MCV 96.1 97.5  PLT 303 224   Cardiac Enzymes:   No results for input(s): CKTOTAL, CKMB, CKMBINDEX, TROPONINI in the last 168 hours. BNP (last 3 results) No results for input(s): BNP in the last 8760 hours.  ProBNP (last 3 results) No results  for input(s): PROBNP in the last 8760 hours.  CBG: No results for input(s): GLUCAP in the last 168 hours.  No results found for this or any previous visit (from the past 240 hour(s)).   Studies: Ct Abdomen Pelvis W Contrast  Result Date: 02/14/2017 CLINICAL DATA:  Abdominal pain. History of bowel obstructions. History of colon cancer post right hemi colectomy and Crohn's disease. EXAM: CT ABDOMEN AND PELVIS WITH CONTRAST TECHNIQUE: Multidetector CT imaging of the abdomen and pelvis was performed using the standard protocol following bolus administration of intravenous contrast. CONTRAST:  59m ISOVUE-300 IOPAMIDOL (ISOVUE-300) INJECTION 61% COMPARISON:  06/28/2015 FINDINGS: Lower chest: No acute abnormality. Hepatobiliary: No focal liver abnormality is seen. Status post cholecystectomy. No biliary dilatation. Pancreas: Unremarkable. No pancreatic ductal dilatation or surrounding inflammatory changes. Spleen: Normal in size without focal abnormality. Adrenals/Urinary Tract: Normal right adrenal gland. Hypoattenuated mass within the left adrenal measures 2.3 cm, stable. No evidence of hydronephrosis or renal calculi. Bilateral scattered too small to be actually characterize hypoattenuated lesions throughout the kidneys. Stomach/Bowel: Diffuse circumferential thickening of the gastric mucosa in the gastric cardia and body. Post right hemicolectomy. Borderline dilated gas and fluid-filled small bowel loops within the central lower abdomen with maximum diameter of 3.6 cm. Transitional point is seen in the right hemipelvis, image 53/29/sequence 2. No obstructing mass is seen. The residual colon demonstrates normal pattern. Vascular/Lymphatic: Aortic atherosclerosis. No enlarged abdominal or pelvic lymph nodes. Reproductive: Status post hysterectomy. No adnexal masses. Other: No evidence of free gas in the abdomen. Small amount of free fluid in the right hemipelvis. Musculoskeletal: Degenerative disc disease and  posterior facet arthropathy in the lower lumbosacral spine. IMPRESSION: Early/incomplete small bowel obstruction versus ileus with transitional point in the right hemipelvis. No obstructing masses seen. Persistent stable in size indeterminate left adrenal mass. Nonspecific diffuse mucosal thickening of the gastric body and cardia. This may represent inflammatory/infectious changes, hypertrophy or potentially infiltrating malignancy. Calcific atherosclerotic disease of the aorta. Electronically Signed   By: DFidela SalisburyM.D.   On: 02/14/2017 13:03    Scheduled Meds: . diatrizoate meglumine-sodium  90 mL Per NG tube Once  . enoxaparin (LOVENOX) injection  40 mg Subcutaneous Q24H  . nicotine  14 mg Transdermal Daily    Continuous Infusions: . sodium chloride 75 mL/hr at 02/15/17 0541     Time spent: 369ms I have personally reviewed and interpreted on  02/15/2017 daily labs, tele strips, imagings as discussed above under date review session and assessment and plans.  I reviewed all nursing notes, pharmacy notes, consultant notes,  vitals, pertinent old records  I have discussed plan of care as described above with RN , patient and family on 02/15/2017   Kaira Stringfield MD, PhD  [facility] Pager [phone] 7PM-7AM, please contact night-coverage at [email or web address], password [email or web address]1/15/2018, 9:59 AM  LOS: 1 day

## 2017-02-16 LAB — BASIC METABOLIC PANEL
Anion gap: 7 (ref 5–15)
BUN: 12 mg/dL (ref 6–20)
CO2: 28 mmol/L (ref 22–32)
Calcium: 8.9 mg/dL (ref 8.9–10.3)
Chloride: 107 mmol/L (ref 101–111)
Creatinine, Ser: 0.69 mg/dL (ref 0.44–1.00)
GFR calc Af Amer: >60 mL/min (ref >60)
GFR calc non Af Amer: >60 mL/min (ref >60)
Glucose, Bld: 111 mg/dL — ABNORMAL HIGH (ref 65–99)
Potassium: 3.8 mmol/L (ref 3.5–5.1)
Sodium: 142 mmol/L (ref 135–145)

## 2017-02-16 LAB — MAGNESIUM: Magnesium: 1.6 mg/dL — ABNORMAL LOW (ref 1.7–2.4)

## 2017-02-16 MED ORDER — PANTOPRAZOLE SODIUM 40 MG PO TBEC: 40.0000 mg | DELAYED_RELEASE_TABLET | Freq: Every day | ORAL | 1 refills | Status: DC

## 2017-02-16 MED ORDER — MAGNESIUM SULFATE 2 GM/50ML IV SOLN
2.0000 g | Freq: Once | INTRAVENOUS | Status: AC
  Administered 2017-02-16: 2 g via INTRAVENOUS
  Filled 2017-02-16: qty 50

## 2017-02-16 MED ORDER — MAGNESIUM OXIDE 400 (241.3 MG) MG PO TABS: 400.0000 mg | ORAL_TABLET | Freq: Every day | ORAL | 0 refills | Status: DC

## 2017-02-16 NOTE — Discharge Summary (Signed)
Discharge Summary  Erica [name] WUJ:[id number] DOB: Jun 15, 1950  PCP: [name], MD  Admit date: 02/14/2017 Discharge date: 02/16/2017  Time spent: <81mns  Recommendations for Outpatient Follow-up:  1. F/u with PMD within a week  for hospital discharge follow up, repeat cbc/bmp at follow up 2. F/u with [facility] Dr GCarlean Purl 3. F/u with pulmonology [facility]  Discharge Diagnoses:  Active Hospital Problems   Diagnosis Date Noted  . SBO (small bowel obstruction) ([facility] 02/14/2017  . Tobacco abuse 02/14/2017  . History of Crohn's disease 02/14/2017  . COPD  GOLD II    . Essential hypertension 02/14/2010  . Takotsubo syndrome 02/12/2009    Resolved Hospital Problems  No resolved problems to display.    Discharge Condition: stable  Diet recommendation: advance diet as tolerated to heart health diet  Filed Weights   02/14/17 0817  Weight: 54.4 kg (120 lb)    History of present illness:  (per admitting MD) PCP: CHoyt Koch MD  Specialists: Dr. GArelia Longestis her gastroenterologist  Chief Complaint: Abdominal pain with nausea  HPI: Erica Bransonis a 66y.o. female with a past medical history of right hemicolectomy many years ago for colon cancer, previous episodes of small bowel obstruction with the last episode being in 2010 for which she underwent surgery with lysis of adhesions.  She also has a history of COPD who continues to smoke cigarettes.  Has a history of Crohn's disease and Takotsubo's syndrome.  Patient was in her usual state of health last night when she developed abdominal pain with cramping.  The cramping got worse and then she had constant continuous pain which was 9 out of 10 in intensity.  She felt nauseated but did not have any vomiting.  Denies any fever or chills.  Last bowel movement was yesterday night which was loose which is her baseline.  Does not report any blood in the stool.  No recent worsening in her diarrhea.  She has not passed  any gas from below since this morning.  She decided to come into the emergency department for further evaluation.  In the emergency department patient underwent CT scan which suggested early small bowel obstruction versus ileus.  She will need hospitalization for further management.    Hospital Course:  Principal Problem:   SBO (small bowel obstruction) (HCC) Active Problems:   Essential hypertension   Takotsubo syndrome   COPD  GOLD II    Tobacco abuse   History of Crohn's disease   RecurrentSmall bowel obstruction:  -H/o colon cancer s/p right hemicolectomy- h/o sbo in 2010 required lysis of adhesions.  -s/p NG placement and  small bowel protocol. Resolved with conservation management. - general surgery signed off.   Nonspecific diffuse mucosal thickening of the gastric body and cardia on CT ab/pel -broad differential, per CT report "This may represent inflammatory/infectious changes, hypertrophy or potentially infiltrating malignancy."  -Last egd in 10/2015 with Gastritis.- Duodenal lipoma -start on iv ppi, she is discharged on protonix -f/u with GI   History of Crohn's disease:  -inflammation in terminal ileum by last colonoscopy in 2017,  -she is not on meds for this.  she reports intermittent diarrhea but it dose not bother her too much. -Appears to be stable.  -follow up with GI   HTN:  Home meds lopressor held due to npo bp stable, on prn iv lopressor with parameters. Resume home bp meds at discharge.  History of Takotsubo'ssyndrome: This was in the past. Has been periodically seen  by cardiology. Last EF was normal in 2015. Stable.  History of COPD and ongoing tobacco abuse:  -Patient unfortunately continues to smoke cigarettes. She does not seem inclined to stop smoking currently. She was counseled. Nicotine patch. -No wheezing on exam, she does has chronic productive cough. - Last CT scan chest in 09/2016 "Bibasilar nodular opacities are favored  to represent areas of age-indeterminate but interval infection. More masslike components within the left lower and right upper lobe could also represent areas of infection or interval scar. Neoplasm felt less likely." -she is closely followed by pulmonology, she is to have another Ct chest soon, follow with pulmonology.    Weight loss : followed by GI Body mass index is 21.6 kg/m.    Code Status: full  Family Communication: patient   Disposition Plan: d/c home   Consultants:  General surgery  Procedures:  Ng insertion and removal  Antibiotics:  none   Discharge Exam: BP 128/70 (BP Location: Right Arm)   Pulse 89   Temp 98.6 F (37 C) (Oral)   Resp 16   Ht 5' 2.5" (1.588 m)   Wt 54.4 kg (120 lb)   SpO2 92%   BMI 21.60 kg/m   General: NAD Cardiovascular: RRR Respiratory: CTABL Abdoment: soft, NT/ND, + bs  Discharge Instructions You were cared for by a hospitalist during your hospital stay. If you have any questions about your discharge medications or the care you received while you were in the hospital after you are discharged, you can call the unit and asked to speak with the hospitalist on call if the hospitalist that took care of you is not available. Once you are discharged, your primary care physician will handle any further medical issues. Please note that NO REFILLS for any discharge medications will be authorized once you are discharged, as it is imperative that you return to your primary care physician (or establish a relationship with a primary care physician if you do not have one) for your aftercare needs so that they can reassess your need for medications and monitor your lab values.  Discharge Instructions    Diet general   Complete by:  As directed    Advance diet as tolerated   Increase activity slowly   Complete by:  As directed      Allergies as of 02/16/2017      Reactions   Morphine Nausea And Vomiting      Medication List      TAKE these medications   albuterol 108 (90 Base) MCG/ACT inhaler Commonly known as:  PROVENTIL HFA;VENTOLIN HFA Inhale 2 puffs daily and use as needed Q 4 hr for wheeze, SOB   CALCIUM 600 600 MG Tabs tablet Generic drug:  calcium carbonate Take 600 mg daily by mouth.   ferrous sulfate 325 (65 FE) MG tablet Take 325 mg daily by mouth.   HYDROcodone-acetaminophen 5-325 MG tablet Commonly known as:  NORCO/VICODIN Take 1 tablet 2 (two) times daily as needed by mouth for moderate pain (pain).   magnesium oxide 400 (241.3 Mg) MG tablet Commonly known as:  MAG-OX Take 1 tablet (400 mg total) daily by mouth.   metoprolol tartrate 25 MG tablet Commonly known as:  LOPRESSOR Take 1 tablet (25 mg total) by mouth 2 (two) times daily.   pantoprazole 40 MG tablet Commonly known as:  PROTONIX Take 1 tablet (40 mg total) daily by mouth.   PROBIOTIC DAILY Caps Take 1 capsule daily by mouth.   SEVERE COLD PO  Take 5 mLs every 6 (six) hours as needed by mouth (for cold).   vitamin B-12 1000 MCG tablet Commonly known as:  CYANOCOBALAMIN Take 1,000 mcg by mouth daily.   Vitamin D (Ergocalciferol) 50000 units Caps capsule Commonly known as:  DRISDOL Take 1 capsule (50,000 Units total) by mouth every 7 (seven) days. What changed:  when to take this      Allergies  Allergen Reactions  . Morphine Nausea And Vomiting   Follow-up Information    Gatha Mayer, MD Follow up in 3 week(s).   Specialty:  Gastroenterology Why:  h/o crohn's CT ab: Nonspecific diffuse mucosal thickening of the gastric body and cardia. Contact information: [address] [address] [phone]        [name], MD Follow up in 1 week(s).   Specialty:  Internal Medicine Why:  hospital discharge follow up.  Contact information: [place] [address] [phone]            The results of significant diagnostics from this hospitalization  (including imaging, microbiology, ancillary and laboratory) are listed below for reference.    Significant Diagnostic Studies: Ct Abdomen Pelvis W Contrast  Result Date: 02/14/2017 CLINICAL DATA:  Abdominal pain. History of bowel obstructions. History of colon cancer post right hemi colectomy and Crohn's disease. EXAM: CT ABDOMEN AND PELVIS WITH CONTRAST TECHNIQUE: Multidetector CT imaging of the abdomen and pelvis was performed using the standard protocol following bolus administration of intravenous contrast. CONTRAST:  67m ISOVUE-300 IOPAMIDOL (ISOVUE-300) INJECTION 61% COMPARISON:  06/28/2015 FINDINGS: Lower chest: No acute abnormality. Hepatobiliary: No focal liver abnormality is seen. Status post cholecystectomy. No biliary dilatation. Pancreas: Unremarkable. No pancreatic ductal dilatation or surrounding inflammatory changes. Spleen: Normal in size without focal abnormality. Adrenals/Urinary Tract: Normal right adrenal gland. Hypoattenuated mass within the left adrenal measures 2.3 cm, stable. No evidence of hydronephrosis or renal calculi. Bilateral scattered too small to be actually characterize hypoattenuated lesions throughout the kidneys. Stomach/Bowel: Diffuse circumferential thickening of the gastric mucosa in the gastric cardia and body. Post right hemicolectomy. Borderline dilated gas and fluid-filled small bowel loops within the central lower abdomen with maximum diameter of 3.6 cm. Transitional point is seen in the right hemipelvis, image 53/29/sequence 2. No obstructing mass is seen. The residual colon demonstrates normal pattern. Vascular/Lymphatic: Aortic atherosclerosis. No enlarged abdominal or pelvic lymph nodes. Reproductive: Status post hysterectomy. No adnexal masses. Other: No evidence of free gas in the abdomen. Small amount of free fluid in the right hemipelvis. Musculoskeletal: Degenerative disc disease and posterior facet arthropathy in the lower lumbosacral spine. IMPRESSION:  Early/incomplete small bowel obstruction versus ileus with transitional point in the right hemipelvis. No obstructing masses seen. Persistent stable in size indeterminate left adrenal mass. Nonspecific diffuse mucosal thickening of the gastric body and cardia. This may represent inflammatory/infectious changes, hypertrophy or potentially infiltrating malignancy. Calcific atherosclerotic disease of the aorta. Electronically Signed   By: DFidela SalisburyM.D.   On: 02/14/2017 13:03   Dg Abd Portable 1v-small Bowel Obstruction Protocol-initial, 8 Hr Delay  Result Date: 02/15/2017 CLINICAL DATA:  Small-bowel protocol 8 hour film. EXAM: PORTABLE ABDOMEN - 1 VIEW COMPARISON:  Abdominal radiograph performed earlier today at 10:57 a.m. FINDINGS: Contrast has progressed to the level of the colon. There is no evidence of bowel obstruction at this time. The visualized bowel gas pattern is grossly unremarkable. Clips are seen at the right upper quadrant. The patient's enteric tube is noted ending overlying the  body of the stomach, with the side port about the gastroesophageal junction. No acute osseous abnormalities are seen. IMPRESSION: No evidence of bowel obstruction at this time. Contrast has progressed to the level of the colon. Electronically Signed   By: Garald Balding M.D.   On: 02/15/2017 23:31   Dg Abd Portable 1v-small Bowel Protocol-position Verification  Result Date: 02/15/2017 CLINICAL DATA:  NG tube localization . EXAM: PORTABLE ABDOMEN - 1 VIEW COMPARISON:  CT 02/14/2017 FINDINGS: NG tube noted with tip projected over the upper stomach. Surgical clips right upper quadrant. Nondistended air-filled loops small large bowel noted. No free air . IMPRESSION: NG tube tip noted projected over the upper stomach. Electronically Signed   By: Marcello Moores  Register   On: 02/15/2017 11:26    Microbiology: No results found for this or any previous visit (from the past 240 hour(s)).   Labs: Basic Metabolic  Panel: Recent Labs  Lab 02/14/17 0946 02/15/17 0447 02/16/17 0503  NA 138 138 142  K 4.4 4.3 3.8  CL 106 109 107  CO2 24 26 28   GLUCOSE 94 96 111*  BUN 14 11 12   CREATININE 0.75 0.79 0.69  CALCIUM 10.0 8.8* 8.9  MG  --   --  1.6*   Liver Function Tests: Recent Labs  Lab 02/14/17 0946  AST 18  ALT 15  ALKPHOS 59  BILITOT 0.6  PROT 7.6  ALBUMIN 4.5   Recent Labs  Lab 02/14/17 0946  LIPASE 31   No results for input(s): AMMONIA in the last 168 hours. CBC: Recent Labs  Lab 02/14/17 0946 02/15/17 0447  WBC 11.0* 6.7  NEUTROABS 6.8  --   HGB 14.6 12.2  HCT 44.0 38.7  MCV 96.1 97.5  PLT 303 224   Cardiac Enzymes: No results for input(s): CKTOTAL, CKMB, CKMBINDEX, TROPONINI in the last 168 hours. BNP: BNP (last 3 results) No results for input(s): BNP in the last 8760 hours.  ProBNP (last 3 results) No results for input(s): PROBNP in the last 8760 hours.  CBG: No results for input(s): GLUCAP in the last 168 hours.     Signed:  Florencia Reasons MD, PhD  [facility] 02/16/2017, 11:06 AM

## 2017-02-16 NOTE — Progress Notes (Signed)
[facility] Progress Note     Subjective: CC:  Having flatus and had a BM. Abdominal pain improved. Patient reports her back pain and abdominal pain are at baseline.   Objective: Vital signs in last 24 hours: Temp:  [98.3 F (36.8 C)-98.6 F (37 C)] 98.6 F (37 C) (11/16 0659) Pulse Rate:  [88-90] 89 (11/16 0659) Resp:  [16-20] 16 (11/16 0659) BP: (128-148)/(69-79) 128/70 (11/16 0659) SpO2:  [92 %-96 %] 92 % (11/16 0659) Last BM Date: 02/13/17  Intake/Output from previous day: 11/15 0701 - 11/16 0700 In: 600 [I.V.:600] Out: 1400 [Urine:700; Emesis/NG output:700] Intake/Output this shift: No intake/output data recorded.  PE: Gen:  Alert, NAD, pleasant HEENT: pupils equal and round, EOM's in tact  Card:  Regular rate and rhythm, pedal pulses 2+ BL Pulm:  Normal effort, clear to auscultation bilaterally Abd: Soft, non-tender, non-distended, bowel sounds present in all 4 quadrants Skin: warm and dry, no rashes  Psych: A&Ox3   Lab Results:  Recent Labs    02/14/17 0946 02/15/17 0447  WBC 11.0* 6.7  HGB 14.6 12.2  HCT 44.0 38.7  PLT 303 224   BMET Recent Labs    02/15/17 0447 02/16/17 0503  NA 138 142  K 4.3 3.8  CL 109 107  CO2 26 28  GLUCOSE 96 111*  BUN 11 12  CREATININE 0.79 0.69  CALCIUM 8.8* 8.9   PT/INR No results for input(s): LABPROT, INR in the last 72 hours. CMP     Component Value Date/Time   NA 142 02/16/2017 0503   K 3.8 02/16/2017 0503   CL 107 02/16/2017 0503   CO2 28 02/16/2017 0503   GLUCOSE 111 (H) 02/16/2017 0503   BUN 12 02/16/2017 0503   CREATININE 0.69 02/16/2017 0503   CALCIUM 8.9 02/16/2017 0503   PROT 7.6 02/14/2017 0946   ALBUMIN 4.5 02/14/2017 0946   AST 18 02/14/2017 0946   ALT 15 02/14/2017 0946   ALKPHOS 59 02/14/2017 0946   BILITOT 0.6 02/14/2017 0946   GFRNONAA >60 02/16/2017 0503   GFRAA >60 02/16/2017 0503   Lipase     Component Value Date/Time   LIPASE 31 02/14/2017 0946        Studies/Results: Ct Abdomen Pelvis W Contrast  Result Date: 02/14/2017 CLINICAL DATA:  Abdominal pain. History of bowel obstructions. History of colon cancer post right hemi colectomy and Crohn's disease. EXAM: CT ABDOMEN AND PELVIS WITH CONTRAST TECHNIQUE: Multidetector CT imaging of the abdomen and pelvis was performed using the standard protocol following bolus administration of intravenous contrast. CONTRAST:  33m ISOVUE-300 IOPAMIDOL (ISOVUE-300) INJECTION 61% COMPARISON:  06/28/2015 FINDINGS: Lower chest: No acute abnormality. Hepatobiliary: No focal liver abnormality is seen. Status post cholecystectomy. No biliary dilatation. Pancreas: Unremarkable. No pancreatic ductal dilatation or surrounding inflammatory changes. Spleen: Normal in size without focal abnormality. Adrenals/Urinary Tract: Normal right adrenal gland. Hypoattenuated mass within the left adrenal measures 2.3 cm, stable. No evidence of hydronephrosis or renal calculi. Bilateral scattered too small to be actually characterize hypoattenuated lesions throughout the kidneys. Stomach/Bowel: Diffuse circumferential thickening of the gastric mucosa in the gastric cardia and body. Post right hemicolectomy. Borderline dilated gas and fluid-filled small bowel loops within the central lower abdomen with maximum diameter of 3.6 cm. Transitional point is seen in the right hemipelvis, image 53/29/sequence 2. No obstructing mass is seen. The residual colon demonstrates normal pattern. Vascular/Lymphatic: Aortic atherosclerosis. No enlarged abdominal or pelvic lymph nodes. Reproductive: Status post hysterectomy. No adnexal masses. Other: No  evidence of free gas in the abdomen. Small amount of free fluid in the right hemipelvis. Musculoskeletal: Degenerative disc disease and posterior facet arthropathy in the lower lumbosacral spine. IMPRESSION: Early/incomplete small bowel obstruction versus ileus with transitional point in the right  hemipelvis. No obstructing masses seen. Persistent stable in size indeterminate left adrenal mass. Nonspecific diffuse mucosal thickening of the gastric body and cardia. This may represent inflammatory/infectious changes, hypertrophy or potentially infiltrating malignancy. Calcific atherosclerotic disease of the aorta. Electronically Signed   By: Fidela Salisbury M.D.   On: 02/14/2017 13:03   Dg Abd Portable 1v-small Bowel Obstruction Protocol-initial, 8 Hr Delay  Result Date: 02/15/2017 CLINICAL DATA:  Small-bowel protocol 8 hour film. EXAM: PORTABLE ABDOMEN - 1 VIEW COMPARISON:  Abdominal radiograph performed earlier today at 10:57 a.m. FINDINGS: Contrast has progressed to the level of the colon. There is no evidence of bowel obstruction at this time. The visualized bowel gas pattern is grossly unremarkable. Clips are seen at the right upper quadrant. The patient's enteric tube is noted ending overlying the body of the stomach, with the side port about the gastroesophageal junction. No acute osseous abnormalities are seen. IMPRESSION: No evidence of bowel obstruction at this time. Contrast has progressed to the level of the colon. Electronically Signed   By: Garald Balding M.D.   On: 02/15/2017 23:31   Dg Abd Portable 1v-small Bowel Protocol-position Verification  Result Date: 02/15/2017 CLINICAL DATA:  NG tube localization . EXAM: PORTABLE ABDOMEN - 1 VIEW COMPARISON:  CT 02/14/2017 FINDINGS: NG tube noted with tip projected over the upper stomach. Surgical clips right upper quadrant. Nondistended air-filled loops small large bowel noted. No free air . IMPRESSION: NG tube tip noted projected over the upper stomach. Electronically Signed   By: Marcello Moores  Register   On: 02/15/2017 11:26    Anti-infectives: Anti-infectives (From admission, onward)   None       Assessment/Plan Abdominal distention and nausea with probable recurrent small bowel obstruction - afebrile, VSS - CT Abd Pelvis 11/14  w/ Early/incomplete small bowel obstruction versus ileus with transitional point in the right hemipelvis. No obstructing masses. - SB protocol; DG Abd with contrast in colon and patient bowel function returned - D/C NGT, start clears and advance diet as tolerated - mobilize   FEN: clear liquids, advance as tolerated ID: none VTE: SCD's, Lovenox   Stable for discharge from surgical perspective once tolerating a diet. No acute surgical needs. We will sign off. Call as needed.   LOS: 2 days    Jill Alexanders , [facility] 02/16/2017, 8:14 AM Pager: [phone] Consults: [phone] Mon-Fri 7:00 am-4:30 pm Sat-Sun 7:00 am-11:30 am

## 2017-02-16 NOTE — Progress Notes (Signed)
Discharge instructions reviewed with patient. Questions answered. Patient denies further questions. Spouse is out fron to drive patient home, per patient. Donne Hazel, RN

## 2017-02-19 ENCOUNTER — Other Ambulatory Visit: Payer: Self-pay | Admitting: Family Medicine

## 2017-02-20 ENCOUNTER — Ambulatory Visit (INDEPENDENT_AMBULATORY_CARE_PROVIDER_SITE_OTHER): Payer: Medicare Other | Admitting: Internal Medicine

## 2017-02-20 ENCOUNTER — Encounter: Payer: Self-pay | Admitting: Internal Medicine

## 2017-02-20 VITALS — BP 112/80 | HR 77 | Temp 98.5°F | Ht 62.5 in | Wt 123.0 lb

## 2017-02-20 DIAGNOSIS — Z23 Encounter for immunization: Secondary | ICD-10-CM | POA: Diagnosis not present

## 2017-02-20 DIAGNOSIS — J449 Chronic obstructive pulmonary disease, unspecified: Secondary | ICD-10-CM

## 2017-02-20 DIAGNOSIS — G8929 Other chronic pain: Secondary | ICD-10-CM

## 2017-02-20 DIAGNOSIS — K56609 Unspecified intestinal obstruction, unspecified as to partial versus complete obstruction: Secondary | ICD-10-CM

## 2017-02-20 DIAGNOSIS — M25511 Pain in right shoulder: Secondary | ICD-10-CM | POA: Diagnosis not present

## 2017-02-20 NOTE — Addendum Note (Signed)
Addended by: Raford Pitcher R on: 02/20/2017 09:42 AM   Modules accepted: Orders

## 2017-02-20 NOTE — Patient Instructions (Signed)
We will see you back in 3 months. Take the rest of the magnesium and then stop taking it.

## 2017-02-20 NOTE — Assessment & Plan Note (Signed)
Appears to be resolving. She is still on mostly clear liquid and soft diet at this time. Good BS on exam and no pain. Advised to slowly advance and not to overindulge at Thanksgiving as this could lead to recurrence.

## 2017-02-20 NOTE — Assessment & Plan Note (Signed)
No flare today, still using albuterol as needed and still smoking. Reminded of the need to quit and she is not able to make an attempt today.

## 2017-02-20 NOTE — Assessment & Plan Note (Signed)
[place] narcotic database run and reviewed without red flags. Contract signed. Last UDS Jan 2018 without red flags. Refill hydrocodone when needed, no refill asked for today.

## 2017-02-20 NOTE — Progress Notes (Signed)
   Subjective:    Patient ID: Erica [name], female    DOB: 1950-12-28, 66 y.o.   MRN: [id number]  HPI The patient is a 66 YO female coming in for hospital follow up (in for SBO with CT abdomen with SBO and no other acute findings, with NG tube and conservative dietary restrictions able to clear up the obstruction). She is doing okay since being home. Appetite still poor. She is still concerned about weight loss. She is down another 3 pounds since 2 months ago although she is up 3 pounds since discharge. She is still having some nausea but doing mostly clear liquids. Still passing gas and having bowel movements. Denies chest pains, SOB. Denies wheezing. Minimal abdominal tenderness, has not taken any pain medication outside of her normal for arthritis.   PMH, [facility], social history reviewed and updated  Review of Systems  Constitutional: Positive for appetite change. Negative for activity change, chills, fatigue and fever.  HENT: Negative.   Eyes: Negative.   Respiratory: Negative for cough, chest tightness and shortness of breath.   Cardiovascular: Negative for chest pain, palpitations and leg swelling.  Gastrointestinal: Positive for nausea. Negative for abdominal distention, abdominal pain, constipation, diarrhea and vomiting.  Musculoskeletal: Positive for arthralgias and myalgias. Negative for gait problem and joint swelling.  Skin: Negative.   Neurological: Negative.   Psychiatric/Behavioral: Negative.      Objective:   Physical Exam  Constitutional: She is oriented to person, place, and time. She appears well-developed and well-nourished.  thin  HENT:  Head: Normocephalic and atraumatic.  Eyes: EOM are normal.  Neck: Normal range of motion.  Cardiovascular: Normal rate and regular rhythm.  Pulmonary/Chest: Effort normal and breath sounds normal. No respiratory distress. She has no wheezes. She has no rales.  Abdominal: Soft. Bowel sounds are normal. She exhibits no distension.  There is no tenderness. There is no rebound.  BS normal, no pain on exam  Musculoskeletal: She exhibits no edema.  Neurological: She is alert and oriented to person, place, and time. Coordination normal.  Skin: Skin is warm and dry.  Psychiatric: She has a normal mood and affect.   Vitals:   02/20/17 0854  BP: 112/80  Pulse: 77  Temp: 98.5 F (36.9 C)  TempSrc: Oral  SpO2: 98%  Weight: 123 lb (55.8 kg)  Height: 5' 2.5" (1.588 m)      Assessment & Plan:  Flu vaccine given at visit

## 2017-03-01 ENCOUNTER — Ambulatory Visit (INDEPENDENT_AMBULATORY_CARE_PROVIDER_SITE_OTHER): Payer: Medicare Other | Admitting: Physician Assistant

## 2017-03-01 ENCOUNTER — Encounter: Payer: Self-pay | Admitting: Physician Assistant

## 2017-03-01 VITALS — BP 128/72 | HR 60 | Ht 62.0 in | Wt 121.0 lb

## 2017-03-01 DIAGNOSIS — Z8719 Personal history of other diseases of the digestive system: Secondary | ICD-10-CM

## 2017-03-01 DIAGNOSIS — R935 Abnormal findings on diagnostic imaging of other abdominal regions, including retroperitoneum: Secondary | ICD-10-CM | POA: Diagnosis not present

## 2017-03-01 DIAGNOSIS — K5000 Crohn's disease of small intestine without complications: Secondary | ICD-10-CM

## 2017-03-01 DIAGNOSIS — R634 Abnormal weight loss: Secondary | ICD-10-CM | POA: Diagnosis not present

## 2017-03-01 MED ORDER — PANTOPRAZOLE SODIUM 40 MG PO TBEC: DELAYED_RELEASE_TABLET | ORAL | 6 refills | Status: DC

## 2017-03-01 NOTE — Progress Notes (Addendum)
Subjective:    Patient ID: Erica [name], female    DOB: [birth year], 66 y.o.   MRN: [id number]  HPI Erica [name] is a pleasant 66 year old white female, known to Dr. 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 She also has family history of colon cancer. She is status post cholecystectomy appendectomy C-section and has history of recurrent small bowel obstructions. She comes in today for post hospital follow-up after recent admission with recurrent small bowel obstruction. She was not seen by GI during that admission but was followed by surgery. She had a brief hospitalization from 1114 through 02/16/2017 and obstruction was felt secondary to adhesions. CT scan on 02/14/2017 showed diffuse circumferential thickening of the gastric mucosa in the cardia and body, patient status post right hemicolectomy, borderline dilated bowel with transition point in the right hemipelvis and a stable indeterminate left adrenal lesion. Last EGD July 2017 showed diffuse inflammation erosions and erythema. Biopsies were done consistent with gastritis no H. pylori Last colonoscopy July 2017 showed diffuse inflammation in the neoterminal ileum and 2 polyps were removed which were hyperplastic. Patient has declined medical therapy for her Crohn's. She is convinced that the side effects from the medications are worse than her illness at least so far. She's not been on any meds over the past several years. Since discharge from the hospital she has continued to have some abdominal discomfort but has gradually been able to advance her diet. She vomited twice after she was discharged from the hospital but none over the past week. Her bowel movements are back to her normal which is loose no melena or hematochezia. She says this episode started in the middle of the night with severe constant pain which progressed and was typical of her obstructions. She has  continued to have weight loss, total from 160 pounds 219 pounds over the past year or so. She feels this is multifactorial because she's had a lot of difficulty with her teeth and a lot of stress with her husband's illness. She has been taking Protonix 40 mg daily since discharge from the hospital.  Review of Systems Pertinent positive and negative review of systems were noted in the above HPI section.  All other review of systems was otherwise negative.  Outpatient Encounter Medications as of 03/01/2017  Medication Sig  . albuterol (PROVENTIL HFA;VENTOLIN HFA) 108 (90 Base) MCG/ACT inhaler Inhale 2 puffs daily and use as needed Q 4 hr for wheeze, SOB  . HYDROcodone-acetaminophen (NORCO/VICODIN) 5-325 MG tablet Take 1 tablet 2 (two) times daily as needed by mouth for moderate pain (pain).  . magnesium oxide (MAG-OX) 400 (241.3 Mg) MG tablet Take 1 tablet (400 mg total) daily by mouth.  . metoprolol tartrate (LOPRESSOR) 25 MG tablet Take 1 tablet (25 mg total) by mouth 2 (two) times daily.  . pantoprazole (PROTONIX) 40 MG tablet Take 1 tablet by mouth every morning.  . Vitamin D, Ergocalciferol, (DRISDOL) 50000 units CAPS capsule TAKE 1 CAPSULE BY MOUTH EVERY 7 DAYS  . [DISCONTINUED] pantoprazole (PROTONIX) 40 MG tablet Take 1 tablet (40 mg total) daily by mouth.  . calcium carbonate (CALCIUM 600) 600 MG TABS tablet Take 600 mg daily by mouth.   . Diphenhydramine-PE-APAP (SEVERE COLD PO) Take 5 mLs every 6 (six) hours as needed by mouth (for cold).  . ferrous sulfate 325 (65 FE) MG tablet Take 325 mg daily by mouth.  . Probiotic Product (PROBIOTIC DAILY) CAPS Take 1 capsule daily by  mouth.  . vitamin B-12 (CYANOCOBALAMIN) 1000 MCG tablet Take 1,000 mcg by mouth daily.   No facility-administered encounter medications on file as of 03/01/2017.    Allergies  Allergen Reactions  . Morphine Nausea And Vomiting   Patient Active Problem List   Diagnosis Date Noted  . SBO (small bowel obstruction)  ([place]) 02/14/2017  . Tobacco abuse 02/14/2017  . History of Crohn's disease 02/14/2017  . Lumbar radiculopathy, acute 12/13/2016  . 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 Chondrocalcinosis 08/25/2016  . Vitamin D deficiency 08/17/2016  . Skin lesion 07/11/2016  . Positive anti-CCP test 06/05/2016  . Weight loss 05/05/2016  . Cervical disc disorder with radiculopathy of cervical region 06/08/2014  . Right shoulder pain 04/20/2014  . Subacromial bursitis 01/26/2014  . Complete rotator cuff tear of left shoulder 11/27/2013  . Primary localized osteoarthrosis, lower leg 11/27/2013  . COPD  GOLD II    . Microscopic hematuria   . Cigarette smoker 02/27/2011  . 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 History of malignant neoplasm of large intestine 02/18/2010  . Hyperlipidemia 02/16/2010  . GERD 02/16/2010  . Osteoarthritis 02/16/2010  . Essential hypertension 02/14/2010  . Takotsubo syndrome 02/12/2009   Social History   Socioeconomic History  . Marital status: Married    Spouse name: Not on file  . Number of children: Not on file  . Years of education: Not on file  . Highest education level: Not on file  Social Needs  . Financial resource strain: Not on file  . Food insecurity - worry: Not on file  . Food insecurity - inability: Not on file  . Transportation needs - medical: Not on file  . Transportation needs - non-medical: Not on file  Occupational History  . Occupation: Retired     Fish farm manager: UNEMPLOYED  Tobacco Use  . Smoking status: Current Every Day Smoker    Packs/day: 1.00    Years: 50.00    Pack years: 50.00    Types: Cigarettes  . Smokeless tobacco: Never Used  Substance and Sexual Activity  . Alcohol use: No    Alcohol/week: 0.0 oz  . Drug use: No  . Sexual activity: Not on file  Other Topics Concern  . Not on file  Social History Narrative   Lives with husband and 2 grand dtr; 46 and 58      Erica [name] family history includes Arthritis in her other; Colon cancer (age of onset: 24) in her sister; Colon cancer (age of onset: 59) in her father; Heart disease in her father; Hypertension in her father; Kidney disease in her father; Liver cancer in her sister; Other in her sister; Throat cancer in her mother.      Objective:    Vitals:   03/01/17 0953  BP: 128/72  Pulse: 60    Physical Exam; well-developed white female in no acute distress, pleasant blood pressure 128/72 pulse 60, height 5 foot 2, weight 121, BMI of 22.1. HEENT; nontraumatic normocephalic EOMI PERRLA sclera anicteric, Cardiovascular; regular rate and rhythm with S1-S2 no murmur or gallop, Pulmonary ;clear bilaterally, Abdomen; soft, bowel sounds are present she has some mild tenderness across the upper abdomen and into the right mid abdomen, no palpable mass or hepatosplenomegaly, incisional scars present, Rectal ;exam not done, Ext;no clubbing cyanosis or edema skin warm and dry, Neuropsych; mood and affect appropriate       Assessment & Plan:   #34 66 year old  female with history of Crohn's ileitis status post right hemicolectomy who also has history of adenocarcinoma of the colon diagnosed 2010. Patient has history of recurrent small bowel obstructions and is seen today in follow-up after recent hospitalization for recurrent obstruction felt secondary to adhesions. She is feeling better, has some persistent mid abdominal discomfort. #2 active Crohn's ileitis-patient has been reluctant to be on any medical therapy for Crohn's #3 abnormal CT scan of the stomach 02/14/2017 showing diffuse circumferential thickening of the gastric mucosa in the cardia and body of the stomach #4 COPD #5 weight loss multifactorial  Plan; Patient is encouraged to continue Protonix 40 mg by mouth every morning, she may need at least a 3-4 month course Will schedule for upper endoscopy with Dr. Carlean Purl Procedure was discussed in  detail with patient including indications risks and benefits and she is agreeable to proceed. We discussed a potential trial of Entocort for her ileitis, which she is willing to consider. Patient will be due for follow-up colonoscopy in July 2020  Jove Beyl Genia Harold PA-C 03/01/2017   Cc: [name], *  Agree with Ms. Genia [name] assessment and plan. Gatha Mayer, MD, [facility]

## 2017-03-01 NOTE — Patient Instructions (Signed)
We have sent the following medications to your pharmacy for you to pick up at your convenience: [place].   You have been scheduled for an endoscopy. Please follow written instructions given to you at your visit today. If you use inhalers (even only as needed), please bring them with you on the day of your procedure. Your physician has requested that you go to www.startemmi.com and enter the access code given to you at your visit today. This web site gives a general overview about your procedure. However, you should still follow specific instructions given to you by our office regarding your preparation for the procedure.   If you are age 27 or older, your body mass index should be between 23-30. Your Body mass index is 22.13 kg/m. If this is out of the aforementioned range listed, please consider follow up with your Primary Care Provider.

## 2017-03-05 ENCOUNTER — Ambulatory Visit (INDEPENDENT_AMBULATORY_CARE_PROVIDER_SITE_OTHER): Payer: Medicare Other | Admitting: Family Medicine

## 2017-03-05 ENCOUNTER — Encounter: Payer: Self-pay | Admitting: Family Medicine

## 2017-03-05 VITALS — BP 110/70 | HR 74 | Ht 62.0 in | Wt 123.0 lb

## 2017-03-05 DIAGNOSIS — M5416 Radiculopathy, lumbar region: Secondary | ICD-10-CM | POA: Diagnosis not present

## 2017-03-05 DIAGNOSIS — M5010 Cervical disc disorder with radiculopathy, unspecified cervical region: Secondary | ICD-10-CM | POA: Diagnosis not present

## 2017-03-05 DIAGNOSIS — M25551 Pain in right hip: Secondary | ICD-10-CM | POA: Diagnosis not present

## 2017-03-05 NOTE — Progress Notes (Signed)
[name] [address] [place], [place] [address] Phone: [phone] Subjective:     CC: Back pain follow-up neck pain follow-up  MOQ:[id number]  Erica [name] is a 66 y.o. female coming in with complaint of back pain.  Has been found to have severe degenerative changes of the lumbar spine.  Patient had an right L5 lumbar radiculopathy and was given a selective nerve root injection January 17, 2017. Patient is also had some abdominal pain recently including small bowel obstruction.  Secondary to abnormal thickening of the stomach.  Patient states worsening symptoms of more of the shoulder.  Patient states that Nexium is irritating a little bit.  Has responded to the epidurals previously.  Patient is also having a low back pain.  Patient states that it seems to be more in the right hip and the right groin.    Patient is also had significant amount of neck discomfort over the course of time and does have degenerative disc disease with radiculopathy.  Past Medical History:  Diagnosis Date  . ADENOCARCINOMA, COLON, HX OF 03/2009  . ANEMIA   . BACK PAIN, LUMBAR   . Cancer [facility]) 2010   colon cancer  . CHF (congestive heart failure) ([place])   . COPD (chronic obstructive pulmonary disease) with emphysema ([place])   . Crohn's  02/2010   ileal ulcers, intol of entercort--refuses treatment  . GERD   . History of transfusion of whole blood   . HYPERLIPIDEMIA   . HYPERTENSION   . Microscopic hematuria    chronic  . Obstruction of intestine or colon ([facility])    adhesions  . OSTEOARTHRITIS   . Rectal fissure    Possible fissure  . Rotator cuff tear   . Takotsubo syndrome 12/2008  . URINARY INCONTINENCE    Past Surgical History:  Procedure Laterality Date  . ABDOMINAL HYSTERECTOMY  1994  . APPENDECTOMY  1964  . BLADDER SUSPENSION    . CESAREAN SECTION     x 3  . CHOLECYSTECTOMY  1995  . COLON SURGERY  03/2009   hemicolectomy colon cancer  . COLONOSCOPY W/  BIOPSIES AND POLYPECTOMY  01/24/2011   (Crohn's)ileitis, internal hemorrhoids  . ECTOPIC PREGNANCY SURGERY    . FACIAL COSMETIC SURGERY    . HAND SURGERY Bilateral 1991  . KNEE SURGERY Right 1981  . right knee replacement  03/2010  . supraorbital fracture surgery    . TONSILLECTOMY  1952  . UPPER GASTROINTESTINAL ENDOSCOPY  05/16/2010   normal  . UTERINE SUSPENSION    . VIDEO BRONCHOSCOPY Bilateral 12/18/2014   Procedure: VIDEO BRONCHOSCOPY WITHOUT FLUORO;  Surgeon: Tanda Rockers, MD;  Location: [facility];  Service: Cardiopulmonary;  Laterality: Bilateral;   Social History   Socioeconomic History  . Marital status: Married    Spouse name: None  . Number of children: None  . Years of education: None  . Highest education level: None  Social Needs  . Financial resource strain: None  . Food insecurity - worry: None  . Food insecurity - inability: None  . Transportation needs - medical: None  . Transportation needs - non-medical: None  Occupational History  . Occupation: Retired     Fish farm manager: UNEMPLOYED  Tobacco Use  . Smoking status: Current Every Day Smoker    Packs/day: 1.00    Years: 50.00    Pack years: 50.00    Types: Cigarettes  . Smokeless tobacco: Never Used  Substance and Sexual Activity  . Alcohol  use: No    Alcohol/week: 0.0 oz  . Drug use: No  . Sexual activity: None  Other Topics Concern  . None  Social History Narrative   Lives with husband and 2 grand dtr; 9 and 16   Allergies  Allergen Reactions  . Morphine Nausea And Vomiting   Family History  Problem Relation Age of Onset  . Colon cancer Father 59  . Hypertension Father   . Heart disease Father   . Kidney disease Father   . Colon cancer Sister 37  . Liver cancer Sister   . Other Sister        amyloidosis  . Throat cancer Mother   . Arthritis Other        Parent, other relative     Past medical history, social, surgical and family history all reviewed in electronic medical record.   No pertanent information unless stated regarding to the chief complaint.   Review of Systems:Review of systems updated and as accurate as of 03/05/17  No headache, visual changes, nausea, vomiting, diarrhea, constipation, dizziness, abdominal pain, skin rash, fevers, chills, night sweats, , swollen lymph nodes,, chest pain, shortness of breath, mood changes.  Positive muscle aches, body aches, weight loss  Objective  Blood pressure 110/70, pulse 74, height 5' 2"  (1.575 m), weight 123 lb (55.8 kg), SpO2 95 %. Systems examined below as of 03/05/17   General: No apparent distress alert and oriented x3 mood and affect normal, dressed appropriately.  HEENT: Pupils equal, extraocular movements intact  Respiratory: Patient's speak in full sentences and does not appear short of breath  Cardiovascular: No lower extremity edema, non tender, no erythema  Skin: Warm dry intact with no signs of infection or rash on extremities or on axial skeleton.  Abdomen: Soft nontender  Neuro: Cranial nerves II through XII are intact, neurovascularly intact in all extremities with 2+ DTRs and 2+ pulses.  Lymph: No lymphadenopathy of posterior or anterior cervical chain or axillae bilaterally.  Gait normal with good balance and coordination.  MSK:  Non tender with full range of motion and good stability and symmetric strength and tone of shoulders, elbows, wrist, hip, knee and ankles bilaterally.  Severe arthritic changes of multiple joints Neck pain shows positive Spurling's bilaterally.  Significant crepitus with at least 10 degrees loss of range of motion in all planes  back Exam:  Inspection: Degenerative scoliosis Motion: Flexion 45 deg, Extension 25 deg, Side Bending to 45 deg bilaterally,  Rotation to 45 deg bilaterally  SLR laying: Positive on the right XSLR laying: Negative  Palpable tenderness: Tender to palpation in the paraspinal musculature of the lumbar spine right greater than left.[name] FABER:  negative. Sensory change: Gross sensation intact to all lumbar and sacral dermatomes.  Reflexes: 2+ at both patellar tendons, 2+ at achilles tendons, Babinski's downgoing.  Strength at foot  Plantar-flexion: 5/5 Dorsi-flexion: 5/5 Eversion: 5/5 Inversion: 5/5  Leg strength  Quad: 5/5 Hamstring: 5/5 Hip flexor: 5/5 Hip abductors: 4/5 symmetric Gait unremarkable.    Impression and Recommendations:     This case required medical decision making of moderate complexity.      Note: This dictation was prepared with Dragon dictation along with smaller phrase technology. Any transcriptional errors that result from this process are unintentional.

## 2017-03-05 NOTE — Assessment & Plan Note (Signed)
I believe the patient's pain secondary to more of the neck than truly the shoulders.  He does have the bilateral shoulder arthritis and we can do injections if needed.  Patient declined that and will consider the epidural otherwise.

## 2017-03-05 NOTE — Patient Instructions (Signed)
Good to see yo u Ice 20 minutes 2 times daily. Usually after activity and before bed. Exercises 3 times a week.  We injected the side of the hip today  Hip xray downstairs Neck and back epidural ordered  The endoscopy is a good idea for the stomach  If you decide to do the epidurals then see me again 3 weeks after the injections Happy holidays!

## 2017-03-05 NOTE — Assessment & Plan Note (Signed)
Continues significant pain.  Has had advanced imaging.  I do think that an epidural could be beneficial.  Did not want to continue with any pain medications in this individual especially with her having a small bowel obstruction.  Wants to avoid narcotics secondary to this.  We discussed icing regimen, home exercise, which activities are doing which wants to avoid.  Patient has failed all other conservative therapy over the course of several.  Years.

## 2017-03-05 NOTE — Progress Notes (Signed)
Shoulder no relief Back- some relief. Still has pain in him going into the groin. (R). Most painful at night. Sleeps on back with leg on long pillow. A little relief.

## 2017-03-07 ENCOUNTER — Ambulatory Visit (INDEPENDENT_AMBULATORY_CARE_PROVIDER_SITE_OTHER)
Admission: RE | Admit: 2017-03-07 | Discharge: 2017-03-07 | Disposition: A | Discharge status: Home / Self Care | Payer: Medicare Other | Source: Ambulatory Visit | Attending: Family Medicine | Admitting: Family Medicine

## 2017-03-07 DIAGNOSIS — M25551 Pain in right hip: Secondary | ICD-10-CM

## 2017-03-19 ENCOUNTER — Telehealth: Payer: Self-pay | Admitting: Internal Medicine

## 2017-03-19 NOTE — Telephone Encounter (Signed)
Copied from [facility] [id number]. Topic: Quick Communication - See Telephone Encounter >> Mar 19, 2017  1:03 PM Boyd Kerbs wrote: CRM for notification. See Telephone encounter for:  Patient is asking for refill on Hydrocodone 5-365m.  She said she filled out and has been approved to have sent to pharmacy.  [place]Phone: [phone]: [phone]  03/19/17.

## 2017-03-19 NOTE — Telephone Encounter (Signed)
Hydrocodone refill. Last refill 02/08/17.

## 2017-03-20 MED ORDER — HYDROCODONE-ACETAMINOPHEN 5-325 MG PO TABS: 1.0000 | ORAL_TABLET | Freq: Two times a day (BID) | ORAL | 0 refills | Status: DC | PRN

## 2017-03-20 NOTE — Telephone Encounter (Signed)
Sent in

## 2017-03-20 NOTE — Telephone Encounter (Signed)
Called pt no answer LMOM refill has been sent to [facility].Marland KitchenJohny Chess

## 2017-03-29 ENCOUNTER — Encounter: Payer: Self-pay | Admitting: Internal Medicine

## 2017-03-29 ENCOUNTER — Ambulatory Visit (AMBULATORY_SURGERY_CENTER): Payer: Medicare Other | Admitting: Internal Medicine

## 2017-03-29 VITALS — BP 125/62 | HR 54 | Temp 98.6°F | Resp 8 | Ht 62.0 in | Wt 121.0 lb

## 2017-03-29 DIAGNOSIS — Z85038 Personal history of other malignant neoplasm of large intestine: Secondary | ICD-10-CM | POA: Diagnosis not present

## 2017-03-29 DIAGNOSIS — R933 Abnormal findings on diagnostic imaging of other parts of digestive tract: Secondary | ICD-10-CM | POA: Diagnosis not present

## 2017-03-29 DIAGNOSIS — K2980 Duodenitis without bleeding: Secondary | ICD-10-CM | POA: Diagnosis not present

## 2017-03-29 DIAGNOSIS — R9389 Abnormal findings on diagnostic imaging of other specified body structures: Secondary | ICD-10-CM

## 2017-03-29 DIAGNOSIS — J449 Chronic obstructive pulmonary disease, unspecified: Secondary | ICD-10-CM | POA: Diagnosis not present

## 2017-03-29 DIAGNOSIS — K3189 Other diseases of stomach and duodenum: Secondary | ICD-10-CM

## 2017-03-29 DIAGNOSIS — K319 Disease of stomach and duodenum, unspecified: Secondary | ICD-10-CM

## 2017-03-29 MED ORDER — SODIUM CHLORIDE 0.9 % IV SOLN: 500.0000 mL | Freq: Once | INTRAVENOUS | Status: DC

## 2017-03-29 NOTE — Op Note (Signed)
[place] Patient Name: Erica [name] Procedure Date: 03/29/2017 9:21 AM MRN: [id number] Endoscopist: Gatha Mayer , MD Age: 66 Referring MD:  Date of Birth: 1951-03-21 Gender: Female Account #: [id number] Procedure:                Upper GI endoscopy Indications:              Abnormal CT of the GI tract Medicines:                Propofol per Anesthesia, Monitored Anesthesia Care Procedure:                Pre-Anesthesia Assessment:                           - Prior to the procedure, a History and Physical                            was performed, and patient medications and                            allergies were reviewed. The patient's tolerance of                            previous anesthesia was also reviewed. The risks                            and benefits of the procedure and the sedation                            options and risks were discussed with the patient.                            All questions were answered, and informed consent                            was obtained. Prior Anticoagulants: The patient has                            taken no previous anticoagulant or antiplatelet                            agents. ASA Grade Assessment: III - A patient with                            severe systemic disease. After reviewing the risks                            and benefits, the patient was deemed in                            satisfactory condition to undergo the procedure.                           After obtaining informed consent, the endoscope was  passed under direct vision. Throughout the                            procedure, the patient's blood pressure, pulse, and                            oxygen saturations were monitored continuously. The                            Model GIF-HQ190 [id number]) scope was introduced                            through the mouth, and advanced to the second part         of duodenum. The upper GI endoscopy was                            accomplished without difficulty. The patient                            tolerated the procedure well. Scope In: Scope Out: Findings:                 Localized mild mucosal changes characterized by                            enlarged fold were found in the second portion of                            the duodenum. Biopsies were taken with a cold                            forceps for histology. Verification of patient                            identification for the specimen was done. Estimated                            blood loss was minimal.                           The exam was otherwise without abnormality.                           The cardia and gastric fundus were normal on                            retroflexion. Complications:            No immediate complications. Estimated Blood Loss:     Estimated blood loss was minimal. Impression:               - Mucosal changes in the duodenum. Biopsied. A                            soft, smooth enlarged fold that was easily  compressed with forceps.                           - The examination was otherwise normal. No chnages                            that correlate with thickened stomach wall seen on                            CT in setting of SBO Recommendation:           - Patient has a contact number available for                            emergencies. The signs and symptoms of potential                            delayed complications were discussed with the                            patient. Return to normal activities tomorrow.                            Written discharge instructions were provided to the                            patient.                           - Resume previous diet.                           - Continue present medications.                           - Await pathology results. Gatha Mayer, MD 03/29/2017  9:43:50 AM This report has been signed electronically.

## 2017-03-29 NOTE — Progress Notes (Signed)
Called to room to assist during endoscopic procedure.  Patient ID and intended procedure confirmed with present staff. Received instructions for my participation in the procedure from the performing physician.  

## 2017-03-29 NOTE — Progress Notes (Signed)
Hospitalization before Thanksgiving.

## 2017-03-29 NOTE — Progress Notes (Signed)
Report given to PACU, vss 

## 2017-03-29 NOTE — Patient Instructions (Addendum)
YOU HAD AN ENDOSCOPIC PROCEDURE TODAY AT [facility]:   Refer to the procedure report that was given to you for any specific questions about what was found during the examination.  If the procedure report does not answer your questions, please call your gastroenterologist to clarify.  If you requested that your care partner not be given the details of your procedure findings, then the procedure report has been included in a sealed envelope for you to review at your convenience later.  YOU SHOULD EXPECT: Some feelings of bloating in the abdomen. Passage of more gas than usual.  Walking can help get rid of the air that was put into your GI tract during the procedure and reduce the bloating. If you had a lower endoscopy (such as a colonoscopy or flexible sigmoidoscopy) you may notice spotting of blood in your stool or on the toilet paper. If you underwent a bowel prep for your procedure, you may not have a normal bowel movement for a few days.  Please Note:  You might notice some irritation and congestion in your nose or some drainage.  This is from the oxygen used during your procedure.  There is no need for concern and it should clear up in a day or so.  SYMPTOMS TO REPORT IMMEDIATELY:     Following upper endoscopy (EGD)  Vomiting of blood or coffee ground material  New chest pain or pain under the shoulder blades  Painful or persistently difficult swallowing  New shortness of breath  Fever of 100F or higher  Black, tarry-looking stools  For urgent or emergent issues, a gastroenterologist can be reached at any hour by calling [phone].   DIET:  We do recommend a small meal at first, but then you may proceed to your regular diet.  Drink plenty of fluids but you should avoid alcoholic beverages for 24 hours.  ACTIVITY:  You should plan to take it easy for the rest of today and you should NOT DRIVE or use heavy machinery until tomorrow (because of the sedation medicines  used during the test).    FOLLOW UP: Our staff will call the number listed on your records the next business day following your procedure to check on you and address any questions or concerns that you may have regarding the information given to you following your procedure. If we do not reach you, we will leave a message.  However, if you are feeling well and you are not experiencing any problems, there is no need to return our call.  We will assume that you have returned to your regular daily activities without incident.  If any biopsies were taken you will be contacted by phone or by letter within the next 1-3 weeks.  Please call us at [phone] if you have not heard about the biopsies in 3 weeks.    SIGNATURES/CONFIDENTIALITY: You and/or your care partner have signed paperwork which will be entered into your electronic medical record.  These signatures attest to the fact that that the information above on your After Visit Summary has been reviewed and is understood.  Full responsibility of the confidentiality of this discharge information lies with you and/or your care-partner.YOU HAD AN ENDOSCOPIC PROCEDURE TODAY AT [facility]:   Refer to the procedure report that was given to you for any specific questions about what was found during the examination.  If the procedure report does not answer your questions, please call your gastroenterologist to clarify.  If you requested that your care partner not be given the details of your procedure findings, then the procedure report has been included in a sealed envelope for you to review at your convenience later.  YOU SHOULD EXPECT: Some feelings of bloating in the abdomen. Passage of more gas than usual.  Walking can help get rid of the air that was put into your GI tract during the procedure and reduce the bloating. If you had a lower endoscopy (such as a colonoscopy or flexible sigmoidoscopy) you may notice spotting of blood in your  stool or on the toilet paper. If you underwent a bowel prep for your procedure, you may not have a normal bowel movement for a few days.  Please Note:  You might notice some irritation and congestion in your nose or some drainage.  This is from the oxygen used during your procedure.  There is no need for concern and it should clear up in a day or so.  SYMPTOMS TO REPORT IMMEDIATELY:    Following upper endoscopy (EGD)  Vomiting of blood or coffee ground material  New chest pain or pain under the shoulder blades  Painful or persistently difficult swallowing  New shortness of breath  Fever of 100F or higher  Black, tarry-looking stools  For urgent or emergent issues, a gastroenterologist can be reached at any hour by calling [phone].   DIET:  We do recommend a small meal at first, but then you may proceed to your regular diet.  Drink plenty of fluids but you should avoid alcoholic beverages for 24 hours.  ACTIVITY:  You should plan to take it easy for the rest of today and you should NOT DRIVE or use heavy machinery until tomorrow (because of the sedation medicines used during the test).    FOLLOW UP: Our staff will call the number listed on your records the next business day following your procedure to check on you and address any questions or concerns that you may have regarding the information given to you following your procedure. If we do not reach you, we will leave a message.  However, if you are feeling well and you are not experiencing any problems, there is no need to return our call.  We will assume that you have returned to your regular daily activities without incident.  If any biopsies were taken you will be contacted by phone or by letter within the next 1-3 weeks.  Please call us at [phone] if you have not heard about the biopsies in 3 weeks.    SIGNATURES/CONFIDENTIALITY: You and/or your care partner have signed paperwork which will be entered into your  electronic medical record.  These signatures attest to the fact that that the information above on your After Visit Summary has been reviewed and is understood.  Full responsibility of the confidentiality of this discharge information lies with you and/or your care-partner.    There was a small enlarged fold in the duodenum - upper intestine. I took biopsies but I do not think it is a problem. Just wanted to be sure. Everything else looks ok (stomach and esophagus).  I appreciate the opportunity to care for you. Gatha Mayer, MD, [facility]

## 2017-03-30 ENCOUNTER — Telehealth: Payer: Self-pay | Admitting: *Deleted

## 2017-03-30 NOTE — Telephone Encounter (Signed)
  Follow up Call-  Call back number 03/29/2017 10/28/2015  Post procedure Call Back phone  # [phone]  Permission to leave phone message Yes Yes  Some recent data might be hidden     Patient questions:  Do you have a fever, pain , or abdominal swelling? No. Pain Score  0 *  Have you tolerated food without any problems? Yes.    Have you been able to return to your normal activities? Yes.    Do you have any questions about your discharge instructions: Diet   No. Medications  No. Follow up visit  No.  Do you have questions or concerns about your Care? No.  Actions: * If pain score is 4 or above: No action needed, pain <4.

## 2017-04-03 ENCOUNTER — Encounter: Payer: Self-pay | Admitting: Internal Medicine

## 2017-04-03 NOTE — Progress Notes (Signed)
Peptic duodenitis Will discuss more at f/u

## 2017-04-11 ENCOUNTER — Encounter: Payer: Self-pay | Admitting: Acute Care

## 2017-04-24 DIAGNOSIS — K2980 Duodenitis without bleeding: Secondary | ICD-10-CM

## 2017-04-24 HISTORY — DX: Duodenitis without bleeding: K29.80

## 2017-04-26 ENCOUNTER — Telehealth: Payer: Self-pay | Admitting: Internal Medicine

## 2017-04-26 NOTE — Telephone Encounter (Signed)
Copied from [place] [id number]. Topic: Quick Communication - Rx Refill/Question >> Apr 26, 2017  1:46 PM Carolyn Stare wrote: Medication HYDROcodone-acetaminophen (NORCO/VICODIN) 5-325 MG tablet   Has the patient contacted their [facility]     Please be advised that RX refills may take up to 3 business days. We ask that you follow-up with your pharmacy.

## 2017-04-26 NOTE — Telephone Encounter (Signed)
Check [place] registry last filled 03/20/2017.Marland KitchenJohny [name]

## 2017-04-27 ENCOUNTER — Ambulatory Visit: Payer: Medicare Other | Admitting: Internal Medicine

## 2017-04-27 MED ORDER — HYDROCODONE-ACETAMINOPHEN 5-325 MG PO TABS: 1.0000 | ORAL_TABLET | Freq: Two times a day (BID) | ORAL | 0 refills | Status: DC | PRN

## 2017-04-27 NOTE — Telephone Encounter (Signed)
Refill sent in

## 2017-04-27 NOTE — Telephone Encounter (Signed)
Notified pt refill sent to pof...Erica [name]

## 2017-05-18 ENCOUNTER — Ambulatory Visit (INDEPENDENT_AMBULATORY_CARE_PROVIDER_SITE_OTHER)
Admission: RE | Admit: 2017-05-18 | Discharge: 2017-05-18 | Disposition: A | Discharge status: Home / Self Care | Payer: Medicare Other | Source: Ambulatory Visit | Attending: Acute Care | Admitting: Acute Care

## 2017-05-18 DIAGNOSIS — R918 Other nonspecific abnormal finding of lung field: Secondary | ICD-10-CM | POA: Diagnosis not present

## 2017-05-18 DIAGNOSIS — F17210 Nicotine dependence, cigarettes, uncomplicated: Secondary | ICD-10-CM | POA: Diagnosis not present

## 2017-05-22 ENCOUNTER — Other Ambulatory Visit: Payer: Self-pay | Admitting: Acute Care

## 2017-05-22 DIAGNOSIS — F17210 Nicotine dependence, cigarettes, uncomplicated: Secondary | ICD-10-CM

## 2017-05-22 DIAGNOSIS — Z122 Encounter for screening for malignant neoplasm of respiratory organs: Secondary | ICD-10-CM

## 2017-05-24 ENCOUNTER — Ambulatory Visit (INDEPENDENT_AMBULATORY_CARE_PROVIDER_SITE_OTHER): Payer: Medicare Other | Admitting: Internal Medicine

## 2017-05-24 ENCOUNTER — Other Ambulatory Visit (INDEPENDENT_AMBULATORY_CARE_PROVIDER_SITE_OTHER): Payer: Medicare Other

## 2017-05-24 ENCOUNTER — Encounter: Payer: Self-pay | Admitting: Internal Medicine

## 2017-05-24 VITALS — BP 128/74 | HR 80 | Ht 62.5 in | Wt 123.0 lb

## 2017-05-24 VITALS — BP 110/78 | HR 60 | Temp 97.8°F | Ht 62.0 in | Wt 123.0 lb

## 2017-05-24 DIAGNOSIS — F1120 Opioid dependence, uncomplicated: Secondary | ICD-10-CM

## 2017-05-24 DIAGNOSIS — R634 Abnormal weight loss: Secondary | ICD-10-CM | POA: Diagnosis not present

## 2017-05-24 DIAGNOSIS — I7090 Unspecified atherosclerosis: Secondary | ICD-10-CM

## 2017-05-24 DIAGNOSIS — M150 Primary generalized (osteo)arthritis: Secondary | ICD-10-CM

## 2017-05-24 DIAGNOSIS — M8949 Other hypertrophic osteoarthropathy, multiple sites: Secondary | ICD-10-CM

## 2017-05-24 DIAGNOSIS — K5000 Crohn's disease of small intestine without complications: Secondary | ICD-10-CM

## 2017-05-24 DIAGNOSIS — F17200 Nicotine dependence, unspecified, uncomplicated: Secondary | ICD-10-CM | POA: Diagnosis not present

## 2017-05-24 DIAGNOSIS — M159 Polyosteoarthritis, unspecified: Secondary | ICD-10-CM

## 2017-05-24 DIAGNOSIS — K2980 Duodenitis without bleeding: Secondary | ICD-10-CM | POA: Diagnosis not present

## 2017-05-24 LAB — LIPID PANEL
Cholesterol: 186 mg/dL (ref 0–200)
HDL: 58 mg/dL (ref >39.00)
LDL Cholesterol: 108 mg/dL — ABNORMAL HIGH (ref 0–99)
NonHDL: 128.34
Total CHOL/HDL Ratio: 3
Triglycerides: 104 mg/dL (ref 0.0–149.0)
VLDL: 20.8 mg/dL (ref 0.0–40.0)

## 2017-05-24 LAB — CBC
HCT: 39.6 % (ref 36.0–46.0)
Hemoglobin: 13.2 g/dL (ref 12.0–15.0)
MCHC: 33.4 g/dL (ref 30.0–36.0)
MCV: 91.9 fl (ref 78.0–100.0)
Platelets: 262 10*3/uL (ref 150.0–400.0)
RBC: 4.3 Mil/uL (ref 3.87–5.11)
RDW: 13.4 % (ref 11.5–15.5)
WBC: 8.8 10*3/uL (ref 4.0–10.5)

## 2017-05-24 LAB — COMPREHENSIVE METABOLIC PANEL
ALT: 13 U/L (ref 0–35)
AST: 13 U/L (ref 0–37)
Albumin: 4 g/dL (ref 3.5–5.2)
Alkaline Phosphatase: 62 U/L (ref 39–117)
BUN: 16 mg/dL (ref 6–23)
CO2: 31 mEq/L (ref 19–32)
Calcium: 9.6 mg/dL (ref 8.4–10.5)
Chloride: 105 mEq/L (ref 96–112)
Creatinine, Ser: 0.73 mg/dL (ref 0.40–1.20)
GFR: 84.47 mL/min (ref >60.00)
Glucose, Bld: 94 mg/dL (ref 70–99)
Potassium: 4.4 mEq/L (ref 3.5–5.1)
Sodium: 141 mEq/L (ref 135–145)
Total Bilirubin: 0.4 mg/dL (ref 0.2–1.2)
Total Protein: 6.5 g/dL (ref 6.0–8.3)

## 2017-05-24 NOTE — Progress Notes (Signed)
   Subjective:    Patient ID: Erica [name], female    DOB: Jul 07, 1950, 67 y.o.   MRN: [id number]  HPI The patient is a 67 YO female coming in for 3 month follow up of her chronic pain (taking hydrocodone up to 2 pills daily, usually takes twice a day but some good days only needs 1 pill, denies injury or overuse since last time, denies taking too much or running out early, able to do ADLs with the medication, denies side effects such as nausea, rash, constipation, confusion) and her weight loss (weight stable from last 3 months ago, still eating well, still smoking, denies blood in stool, some nausea with eating too much at once, she denies weakness or numbness new or different, not using any meal supplements at this time) and her back and neck pain (from accident in the past, taking hydrocodone with good results, this helps her to be able to do ADLs such as walking and cooking).   [place] narcotic database reviewed and appropriate.   Review of Systems  Constitutional: Negative for activity change, appetite change, fatigue, fever and unexpected weight change.  HENT: Negative.   Eyes: Negative.   Respiratory: Negative for cough, chest tightness and shortness of breath.   Cardiovascular: Negative for chest pain, palpitations and leg swelling.  Gastrointestinal: Positive for nausea. Negative for abdominal distention, abdominal pain, constipation, diarrhea and vomiting.  Musculoskeletal: Positive for arthralgias and back pain. Negative for gait problem and joint swelling.  Skin: Negative.   Neurological: Negative.   Psychiatric/Behavioral: Negative.       Objective:   Physical Exam  Constitutional: She is oriented to person, place, and time. She appears well-developed and well-nourished.  thin  HENT:  Head: Normocephalic and atraumatic.  Eyes: EOM are normal.  Neck: Normal range of motion.  Cardiovascular: Normal rate and regular rhythm.  Pulmonary/Chest: Effort normal and breath sounds  normal. No respiratory distress. She has no wheezes. She has no rales.  Abdominal: Soft. Bowel sounds are normal. She exhibits no distension. There is no tenderness. There is no rebound.  Musculoskeletal: She exhibits no edema.  Neurological: She is alert and oriented to person, place, and time. Coordination normal.  Skin: Skin is warm and dry.  Psychiatric: She has a normal mood and affect.   Vitals:   05/24/17 0842  BP: 110/78  Pulse: 60  Temp: 97.8 F (36.6 C)  TempSrc: Oral  SpO2: 98%  Weight: 123 lb (55.8 kg)  Height: 5' 2"  (1.575 m)      Assessment & Plan:

## 2017-05-24 NOTE — Patient Instructions (Signed)
If you are age 68 or older, your body mass index should be between 23-30. Your Body mass index is 22.14 kg/m. If this is out of the aforementioned range listed, please consider follow up with your Primary Care Provider.  If you are age 69 or younger, your body mass index should be between 19-25. Your Body mass index is 22.14 kg/m. If this is out of the aformentioned range listed, please consider follow up with your Primary Care Provider.   Please check with your insurance company on coverage for either Humira, Remicaid or Cimzia.  Please follow up in 6 months. We will place a recall reminder for the appointment.  Any questions please call us at [phone]  Thank you for choosing [facility]  Dr.Carl Carlean Purl

## 2017-05-24 NOTE — Patient Instructions (Signed)
We will check the labs today and call you back about the results.   We will see you back in 3 months so we can continue doing the hydrocodone.

## 2017-05-24 NOTE — Progress Notes (Signed)
Erica [name] 67 y.o. 1951/02/02 [id number]  Assessment & Plan:   Encounter Diagnoses  Name Primary?  . Duodenitis determined by biopsy Yes  . Crohn's disease of ileum without complication ([place])   . Smoker     She will stay on her PPI.  She will look into Remicade, Cimzia or Humira coverage and if she is willing we could consider treating her Crohn's disease to prevent further problems down the road.  She is a smoker, she has been counseled multiple times that smoking may help reduce inflammation and Crohn's disease and help her overall health.  I anticipate seeing her routinely in about 6 months or sooner if needed.  She is on a recall schedule for colonoscopy due to prior colon cancer and is due again in 2020.   Subjective:   Chief Complaint: Follow-up of duodenitis  HPI The patient is here for follow-up, she had a CT scan in late 2018 suggested an abnormal stomach wall, at EGD in December she had some peptic duodenitis detected she had some thickened folds in the duodenum but no clear-cut ulcers.  She is on pantoprazole and says she feels better with less upper GI symptoms.  Her weight is overall stable.  Her husband remains ill with pulmonary problems and is now seeing Dr. Melvyn Novas and that is a great source of stress for her.  Changes in her prescription insurance are also troubling and she is going to change plans.  She is more open to the idea of taking a medication to prevent complications from her Crohn's disease and is willing to check into coverage for biologic treatment.  Wt Readings from Last 3 Encounters:  05/24/17 123 lb (55.8 kg)  05/24/17 123 lb (55.8 kg)  03/29/17 121 lb (54.9 kg)    Allergies  Allergen Reactions  . Morphine Nausea And Vomiting   Current Meds  Medication Sig  . albuterol (PROVENTIL HFA;VENTOLIN HFA) 108 (90 Base) MCG/ACT inhaler Inhale 2 puffs daily and use as needed Q 4 hr for wheeze, SOB  . calcium carbonate (CALCIUM 600) 600 MG TABS  tablet Take 600 mg daily by mouth.   . Diphenhydramine-PE-APAP (SEVERE COLD PO) Take 5 mLs every 6 (six) hours as needed by mouth (for cold).  . ferrous sulfate 325 (65 FE) MG tablet Take 325 mg daily by mouth.  [name] HYDROcodone-acetaminophen (NORCO/VICODIN) 5-325 MG tablet Take 1 tablet by mouth 2 (two) times daily as needed for moderate pain (pain).  . magnesium oxide (MAG-OX) 400 (241.3 Mg) MG tablet Take 1 tablet (400 mg total) daily by mouth.  . metoprolol tartrate (LOPRESSOR) 25 MG tablet Take 1 tablet (25 mg total) by mouth 2 (two) times daily.  . pantoprazole (PROTONIX) 40 MG tablet Take 1 tablet by mouth every morning.  . Probiotic Product (PROBIOTIC DAILY) CAPS Take 1 capsule daily by mouth.  . vitamin B-12 (CYANOCOBALAMIN) 1000 MCG tablet Take 1,000 mcg by mouth daily.  . Vitamin D, Ergocalciferol, (DRISDOL) 50000 units CAPS capsule TAKE 1 CAPSULE BY MOUTH EVERY 7 DAYS   Past Medical History:  Diagnosis Date  . ADENOCARCINOMA, COLON, HX OF 03/2009  . ANEMIA   . BACK PAIN, LUMBAR   . Cancer [facility]) 2010   colon cancer  . CHF (congestive heart failure) ([place])   . COPD (chronic obstructive pulmonary disease) with emphysema ([place])   . Crohn's  02/2010   ileal ulcers, intol of entercort--refuses treatment  . Duodenitis 03/29/2017   Peptic  . GERD   .  History of transfusion of whole blood   . HYPERLIPIDEMIA   . HYPERTENSION   . Microscopic hematuria    chronic  . Obstruction of intestine or colon ([facility])    adhesions  . OSTEOARTHRITIS   . Rectal fissure    Possible fissure  . Rotator cuff tear   . Takotsubo syndrome 12/2008  . URINARY INCONTINENCE    Past Surgical History:  Procedure Laterality Date  . ABDOMINAL HYSTERECTOMY  1994  . APPENDECTOMY  1964  . BLADDER SUSPENSION    . CESAREAN SECTION     x 3  . CHOLECYSTECTOMY  1995  . COLON SURGERY  03/2009   hemicolectomy colon cancer  . COLONOSCOPY W/ BIOPSIES AND POLYPECTOMY  01/24/2011   (Crohn's)ileitis, internal  hemorrhoids  . ECTOPIC PREGNANCY SURGERY    . ESOPHAGOGASTRODUODENOSCOPY    . FACIAL COSMETIC SURGERY    . HAND SURGERY Bilateral 1991  . KNEE SURGERY Right 1981  . right knee replacement  03/2010  . supraorbital fracture surgery    . TONSILLECTOMY  1952  . UPPER GASTROINTESTINAL ENDOSCOPY  05/16/2010   normal  . UTERINE SUSPENSION    . VIDEO BRONCHOSCOPY Bilateral 12/18/2014   Procedure: VIDEO BRONCHOSCOPY WITHOUT FLUORO;  Surgeon: Tanda Rockers, MD;  Location: [facility];  Service: Cardiopulmonary;  Laterality: Bilateral;   Social History   Social History Narrative   Lives with husband and 2 grand dtr; 51 and 54   family history includes Arthritis in her other; Colon cancer (age of onset: 68) in her sister; Colon cancer (age of onset: 46) in her father; Heart disease in her father; Hypertension in her father; Kidney disease in her father; Liver cancer in her sister; Other in her sister; Throat cancer in her mother.   Review of Systems As above  Objective:   Physical Exam BP 128/74   Pulse 80   Ht 5' 2.5" (1.588 m)   Wt 123 lb (55.8 kg)   BMI 22.14 kg/m  No acute distress  .[id number]

## 2017-05-25 ENCOUNTER — Encounter: Payer: Self-pay | Admitting: Internal Medicine

## 2017-05-25 DIAGNOSIS — F1120 Opioid dependence, uncomplicated: Secondary | ICD-10-CM | POA: Insufficient documentation

## 2017-05-25 DIAGNOSIS — F17200 Nicotine dependence, unspecified, uncomplicated: Secondary | ICD-10-CM | POA: Insufficient documentation

## 2017-05-25 HISTORY — DX: Opioid dependence, uncomplicated: F11.20

## 2017-05-25 LAB — PAIN MGMT, PROFILE 8 W/CONF, U
6 Acetylmorphine: NEGATIVE ng/mL (ref <10)
Alcohol Metabolites: NEGATIVE ng/mL (ref <500)
Amphetamines: NEGATIVE ng/mL (ref <500)
Benzodiazepines: NEGATIVE ng/mL (ref <100)
Buprenorphine, Urine: NEGATIVE ng/mL (ref <5)
Cocaine Metabolite: NEGATIVE ng/mL (ref <150)
Creatinine: 21 mg/dL
MDMA: NEGATIVE ng/mL (ref <500)
Marijuana Metabolite: NEGATIVE ng/mL (ref <20)
Opiates: NEGATIVE ng/mL (ref <100)
Oxidant: NEGATIVE ug/mL (ref <200)
Oxycodone: NEGATIVE ng/mL (ref <100)
pH: 6.6 (ref 4.5–9.0)

## 2017-05-25 NOTE — Assessment & Plan Note (Signed)
Continues to use hydrocodone BID prn for pain. Most days uses 2 and some days only 1. Denies overuse. [place] narcotic database reviewed and appropriate. Pain contract updated 02/2017. Checking UDS today as none in the last year.

## 2017-05-25 NOTE — Assessment & Plan Note (Signed)
Checking UDS today as needed. ORT low risk. [place] narcotic database reviewed and no inappropriate fills. Return in 3 months for visit. Refill hydrocodone until follow up. She is able to do ADLs such as bathing and cooking with this medication which she is unable to do without. She is aware of the long term risks and side effects from usage and is willing to accept the risks to continue current therapy.

## 2017-05-25 NOTE — Assessment & Plan Note (Signed)
Weight is stable although she has not been able to gain weight. Checking albumin to check nutritional status.

## 2017-05-29 ENCOUNTER — Telehealth: Payer: Self-pay | Admitting: Internal Medicine

## 2017-05-29 NOTE — Telephone Encounter (Signed)
Copied from [place]. Topic: General - Other >> May 29, 2017  8:45 AM Yvette Rack wrote: Reason for CRM: patient calling back about lab results

## 2017-05-29 NOTE — Telephone Encounter (Signed)
Patient was informed of results

## 2017-05-29 NOTE — Telephone Encounter (Signed)
Patient calling again, please advise. Call back [phone]

## 2017-06-01 ENCOUNTER — Other Ambulatory Visit: Payer: Self-pay | Admitting: Internal Medicine

## 2017-06-01 MED ORDER — HYDROCODONE-ACETAMINOPHEN 5-325 MG PO TABS: 1.0000 | ORAL_TABLET | Freq: Two times a day (BID) | ORAL | 0 refills | Status: DC | PRN

## 2017-06-01 NOTE — Telephone Encounter (Signed)
LOV 05/24/17 Dr. Teryl Lucy on [address]

## 2017-06-01 NOTE — Telephone Encounter (Signed)
Check [place] registry last filled 04/27/2017.Marland KitchenJohny [name]

## 2017-06-01 NOTE — Telephone Encounter (Signed)
Copied from [place] [id number]. Topic: Quick Communication - Rx Refill/Question >> Jun 01, 2017  8:46 AM Lolita Rieger, RMA wrote: Medication: oxycodone 5-325 mg   Has the patient contacted their pharmacy? yes   (Agent: If no, request that the patient contact the pharmacy for the refill.)   Preferred Pharmacy (with phone number or street name): [facility] on [address]   Agent: Please be advised that RX refills may take up to 3 business days. We ask that you follow-up with your pharmacy.

## 2017-06-05 ENCOUNTER — Ambulatory Visit: Payer: Medicare Other | Admitting: Internal Medicine

## 2017-07-04 ENCOUNTER — Other Ambulatory Visit: Payer: Self-pay | Admitting: Internal Medicine

## 2017-07-04 MED ORDER — HYDROCODONE-ACETAMINOPHEN 5-325 MG PO TABS: 1.0000 | ORAL_TABLET | Freq: Two times a day (BID) | ORAL | 0 refills | Status: DC | PRN

## 2017-07-04 NOTE — Telephone Encounter (Signed)
Copied from [place] [phone]. Topic: Quick Communication - Rx Refill/Question >> Jul 04, 2017  3:18 PM Cecelia Byars, NT wrote: Medication: HYDROcodone-acetaminophen (NORCO/VICODIN) 5-325 MG tablet Has the patient contacted their pharmacy? {yes no (Agent: If no, request that the patient contact the pharmacy for the refill.) Preferred Pharmacy (with phone number or street name): [facility], [address] [phone] (Phone) [phone] (Fax Agent: Please be advised that RX refills may take up to 3 business days. We ask that you follow-up with your pharmacy.

## 2017-07-04 NOTE — Telephone Encounter (Signed)
Please advise per doctor Crawford's absence. Thank you  Control database checked last refill:06/01/2017 LOV: 05/24/2017 for pain management

## 2017-07-04 NOTE — Telephone Encounter (Signed)
Done erx 

## 2017-08-03 ENCOUNTER — Other Ambulatory Visit: Payer: Self-pay | Admitting: Internal Medicine

## 2017-08-03 NOTE — Telephone Encounter (Signed)
Copied from [place] [id number]. Topic: Quick Communication - See Telephone Encounter >> Aug 03, 2017  9:20 AM Vernona Rieger wrote: CRM for notification. See Telephone encounter for: 08/03/17.  HYDROcodone-acetaminophen (NORCO/VICODIN) 5-325 MG tablet  [place]

## 2017-08-03 NOTE — Telephone Encounter (Signed)
Request for refill of Hydrocodone-acetaminophen (NORCO), last filled 07/04/17 #60  LOV: 05/24/17 Dr. Teryl [facility] [place]   [phone]

## 2017-08-06 NOTE — Telephone Encounter (Signed)
Can another provider fill?

## 2017-08-06 NOTE — Telephone Encounter (Signed)
Patient checking status, please advise °

## 2017-08-07 NOTE — Telephone Encounter (Signed)
Check [place] registry last filled 07/04/2017. MD is out of the office pls advise on refill.Marland KitchenJohny Chess

## 2017-08-08 MED ORDER — HYDROCODONE-ACETAMINOPHEN 5-325 MG PO TABS: 1.0000 | ORAL_TABLET | Freq: Two times a day (BID) | ORAL | 0 refills | Status: DC | PRN

## 2017-08-08 NOTE — Addendum Note (Signed)
Addended by: Pricilla Holm A on: 08/08/2017 08:47 AM   Modules accepted: Orders

## 2017-08-08 NOTE — Telephone Encounter (Signed)
Pls advise if ok for refill.Marland KitchenJohny [name]

## 2017-08-08 NOTE — Telephone Encounter (Signed)
Refill sent in

## 2017-08-22 ENCOUNTER — Other Ambulatory Visit: Payer: Medicare Other

## 2017-08-22 ENCOUNTER — Encounter: Payer: Self-pay | Admitting: Internal Medicine

## 2017-08-22 ENCOUNTER — Ambulatory Visit (INDEPENDENT_AMBULATORY_CARE_PROVIDER_SITE_OTHER): Payer: Medicare Other | Admitting: Internal Medicine

## 2017-08-22 VITALS — BP 110/76 | HR 62 | Temp 98.0°F | Ht 62.5 in | Wt 127.0 lb

## 2017-08-22 DIAGNOSIS — M1710 Unilateral primary osteoarthritis, unspecified knee: Secondary | ICD-10-CM | POA: Diagnosis not present

## 2017-08-22 DIAGNOSIS — G8929 Other chronic pain: Secondary | ICD-10-CM

## 2017-08-22 DIAGNOSIS — F1120 Opioid dependence, uncomplicated: Secondary | ICD-10-CM | POA: Diagnosis not present

## 2017-08-22 MED ORDER — PREDNISONE 20 MG PO TABS: 40.0000 mg | ORAL_TABLET | Freq: Every day | ORAL | 0 refills | Status: DC

## 2017-08-22 MED ORDER — MELOXICAM 15 MG PO TABS: 15.0000 mg | ORAL_TABLET | Freq: Every day | ORAL | 6 refills | Status: DC

## 2017-08-22 MED ORDER — DOXYCYCLINE HYCLATE 100 MG PO TABS: 100.0000 mg | ORAL_TABLET | Freq: Two times a day (BID) | ORAL | 0 refills | Status: DC

## 2017-08-22 NOTE — Patient Instructions (Signed)
We have sent in doxycycline to take 1 pill twice a day for 1 week.   We have sent in prednisone to take 2 pills daily for 5 days.   We have sent in the meloxicam to take 1 pill daily for the pain to see if it helps some.

## 2017-08-22 NOTE — Progress Notes (Signed)
   Subjective:    Patient ID: Erica [name], female    DOB: 25-Aug-1950, 67 y.o.   MRN: [id number]  HPI The patient is a 67 YO female coming in for chronic pain management (taking hydrocodone 5/325 BID for pain, denies abusing medication, last UDS negative which she cannot understand, denies worsening pain since last visit, still able to do ADLs, disability since 2014 for this pain), back pain (and hip pain, gets injections sometimes which can help, hydrocodone BID for pain as well as mobic and takes some tylenol if needed, denies side effects, denies falls or injury), and opioid dependence (taking her hydrocodone 5/325 BID for pain and has for some time, last UDS negative although she denies missing doses, taking too much, is still working well for pain, takes pain from 7 to 4 which allows her to do some cooking, cleaning, walking, denies side effects).   Review of Systems  Constitutional: Positive for activity change. Negative for appetite change, chills, fatigue, fever and unexpected weight change.  HENT: Negative.   Eyes: Negative.   Respiratory: Negative.   Cardiovascular: Negative.   Gastrointestinal: Negative.   Musculoskeletal: Positive for arthralgias, back pain and myalgias. Negative for gait problem and joint swelling.  Skin: Negative.   Neurological: Negative.   Psychiatric/Behavioral: Negative.       Objective:   Physical Exam  Constitutional: She is oriented to person, place, and time. She appears well-developed and well-nourished.  thin  HENT:  Head: Normocephalic and atraumatic.  Eyes: EOM are normal.  Neck: Normal range of motion.  Cardiovascular: Normal rate and regular rhythm.  Pulmonary/Chest: Effort normal and breath sounds normal. No respiratory distress. She has no wheezes. She has no rales.  Abdominal: Soft. Bowel sounds are normal. She exhibits no distension. There is no tenderness. There is no rebound.  Musculoskeletal: She exhibits tenderness. She exhibits  no edema.  Neurological: She is alert and oriented to person, place, and time. Coordination normal.  Skin: Skin is warm and dry.  Psychiatric: She has a normal mood and affect.   Vitals:   08/22/17 0837  BP: 110/76  Pulse: 62  Temp: 98 F (36.7 C)  TempSrc: Oral  SpO2: 96%  Weight: 127 lb (57.6 kg)  Height: 5' 2.5" (1.588 m)      Assessment & Plan:

## 2017-08-24 DIAGNOSIS — G8929 Other chronic pain: Secondary | ICD-10-CM | POA: Insufficient documentation

## 2017-08-24 DIAGNOSIS — Z79899 Other long term (current) drug therapy: Secondary | ICD-10-CM | POA: Insufficient documentation

## 2017-08-24 NOTE — Assessment & Plan Note (Signed)
She is taking hydrocodone 5/325 BID for pain and [place] narcotic database indicates no early fills. Last UDS negative for prescribed substance so recheck today. She is aware that if she continues to take negative screens we will not be able to continue prescribing. ORT low risk. Denies side effects. She is aware of the risk of falls, memory changes, dependence, addiction.

## 2017-08-24 NOTE — Assessment & Plan Note (Signed)
Checking UDS today as last inappropriate. Contract signed within the last year. She denies taking inappropriately. [place] narcotic database without early fills or red flag signs. Reminded about risk of dependence, addiction and she wishes to continue with therapy. Will await UDS results prior to refill.

## 2017-08-24 NOTE — Assessment & Plan Note (Signed)
Last UDS inappropriate so another ordered today. Contract signed within the last year. Taking hydrocodone 5/325 BID for pain in the back and hips. She is on disability for the same condition since 2014. She filled first week of May so not due for refill yet and will await UDS results.

## 2017-08-28 NOTE — Progress Notes (Signed)
[name] [address] [place], [place] [address] Phone: [phone] Subjective:     CC: Back pain, neck pain follow-up  [id number]  Erica [name] is a 67 y.o. female coming in with complaint of back and hip pain. She states that she needs to have an epidural as she has sharp pain on the right side of her leg when she steps down. She also notes with knee flexion that she will have radiating pain into the right hip.  Patient has known severe arthritic changes and degenerative disc disease of the lumbar spine, bilateral shoulders, and cervical spine.  Continues to have some difficulty.  Continues to get narcotics from primary care provider.  Does take them regularly.  Patient states that the pain is unrelenting.  Stopping her from daily activities.  Has been doing conservative medications including other over-the-counter medications as well as icing regimen with no significant benefit.  Does not have time to read to do any physical therapy.  Has responded to epidurals in the past.      Past Medical History:  Diagnosis Date  . ADENOCARCINOMA, COLON, HX OF 03/2009  . ANEMIA   . BACK PAIN, LUMBAR   . Cancer [facility]) 2010   colon cancer  . CHF (congestive heart failure) ([place])   . COPD (chronic obstructive pulmonary disease) with emphysema ([place])   . Crohn's  02/2010   ileal ulcers, intol of entercort--refuses treatment  . Duodenitis 03/29/2017   Peptic  . GERD   . History of transfusion of whole blood   . HYPERLIPIDEMIA   . HYPERTENSION   . Microscopic hematuria    chronic  . Obstruction of intestine or colon (HCC)    adhesions  . OSTEOARTHRITIS   . Rectal fissure    Possible fissure  . Rotator cuff tear   . Takotsubo syndrome 12/2008  . URINARY INCONTINENCE    Past Surgical History:  Procedure Laterality Date  . ABDOMINAL HYSTERECTOMY  1994  . APPENDECTOMY  1964  . BLADDER SUSPENSION    . CESAREAN SECTION     x 3  . CHOLECYSTECTOMY  1995    . COLON SURGERY  03/2009   hemicolectomy colon cancer  . COLONOSCOPY W/ BIOPSIES AND POLYPECTOMY  01/24/2011   (Crohn's)ileitis, internal hemorrhoids  . ECTOPIC PREGNANCY SURGERY    . ESOPHAGOGASTRODUODENOSCOPY    . FACIAL COSMETIC SURGERY    . HAND SURGERY Bilateral 1991  . KNEE SURGERY Right 1981  . right knee replacement  03/2010  . supraorbital fracture surgery    . TONSILLECTOMY  1952  . UPPER GASTROINTESTINAL ENDOSCOPY  05/16/2010   normal  . UTERINE SUSPENSION    . VIDEO BRONCHOSCOPY Bilateral 12/18/2014   Procedure: VIDEO BRONCHOSCOPY WITHOUT FLUORO;  Surgeon: Tanda Rockers, MD;  Location: [facility];  Service: Cardiopulmonary;  Laterality: Bilateral;   Social History   Socioeconomic History  . Marital status: Married    Spouse name: Not on file  . Number of children: Not on file  . Years of education: Not on file  . Highest education level: Not on file  Occupational History  . Occupation: Retired     Fish farm manager: UNEMPLOYED  Social Needs  . Financial resource strain: Not on file  . Food insecurity:    Worry: Not on file    Inability: Not on file  . Transportation needs:    Medical: Not on file    Non-medical: Not on file  Tobacco Use  . Smoking  status: Current Every Day Smoker    Packs/day: 1.00    Years: 50.00    Pack years: 50.00    Types: Cigarettes  . Smokeless tobacco: Never Used  Substance and Sexual Activity  . Alcohol use: No    Alcohol/week: 0.0 oz  . Drug use: No  . Sexual activity: Not on file  Lifestyle  . Physical activity:    Days per week: Not on file    Minutes per session: Not on file  . Stress: Not on file  Relationships  . Social connections:    Talks on phone: Not on file    Gets together: Not on file    Attends religious service: Not on file    Active member of club or organization: Not on file    Attends meetings of clubs or organizations: Not on file    Relationship status: Not on file  Other Topics Concern  . Not on file   Social History Narrative   Lives with husband and 2 grand dtr; 9 and 16   Allergies  Allergen Reactions  . Morphine Nausea And Vomiting   Family History  Problem Relation Age of Onset  . Colon cancer Father 76  . Hypertension Father   . Heart disease Father   . Kidney disease Father   . Colon cancer Sister 63  . Liver cancer Sister   . Other Sister        amyloidosis  . Throat cancer Mother   . Arthritis Other        Parent, other relative     Past medical history, social, surgical and family history all reviewed in electronic medical record.  No pertanent information unless stated regarding to the chief complaint.   Review of Systems:Review of systems updated and as accurate as of 08/29/17  No headache, visual changes, nausea, vomiting, diarrhea, constipation, dizziness, abdominal pain, skin rash, fevers, chills, night sweats, chest pain, shortness of breath, mood changes.  Positive body aches, muscle aches  Objective  Blood pressure 100/72, pulse 70, height 5' 2.5" (1.588 m), weight 125 lb (56.7 kg), SpO2 96 %. Systems examined below as of 08/29/17   General: No apparent distress alert and oriented x3 mood and affect normal, dressed appropriately.  HEENT: Pupils equal, extraocular movements intact  Respiratory: Patient's speak in full sentences and does not appear short of breath  Cardiovascular: No lower extremity edema, non tender, no erythema  Skin: Warm dry intact with no signs of infection or rash on extremities or on axial skeleton.  Abdomen: Soft diffuse tenderness Neuro: Cranial nerves II through XII are intact, neurovascularly intact in all extremities with 2+ DTRs and 2+ pulses.  Lymph: No lymphadenopathy of posterior or anterior cervical chain or axillae bilaterally.  Gait antalgic MSK: Severe arthritic changes of multiple joints.  Neck: Inspection loss of lordosis No palpable stepoffs. Negative Spurling's maneuver. Limited range of motion in all planes  especially with side bending and rotation.  Crepitus noted Grip strength and sensation normal in bilateral hands Strength is 4 out of 5 but symmetric Reflexes normal Severe tightness with multiple trigger points in the right trapezius muscle and shoulder girdle.  Patient's low back exam shows loss of lordosis as well as with some degenerative scoliosis.  Severe tenderness and positive straight leg test at 20 degrees right greater than left.  Unable to do [place] secondary to pain.  Neurovascularly intact distally but has had some muscle atrophy of the thighs from previous exam.  After  verbal consent patient was prepped with alcohol swabs and with a 25-gauge half inch needle was injected into 4 distinct trigger points in the right shoulder girdle area.  3 cc of 0.5% Marcaine and 1 cc of Kenalog 57m/mL was used. No blood loss.  Postinjection instructions given.    Impression and Recommendations:     This case required medical decision making of moderate complexity.      Note: This dictation was prepared with Dragon dictation along with smaller phrase technology. Any transcriptional errors that result from this process are unintentional.

## 2017-08-29 ENCOUNTER — Encounter: Payer: Self-pay | Admitting: Family Medicine

## 2017-08-29 ENCOUNTER — Ambulatory Visit (INDEPENDENT_AMBULATORY_CARE_PROVIDER_SITE_OTHER): Payer: Medicare Other | Admitting: Family Medicine

## 2017-08-29 VITALS — BP 100/72 | HR 70 | Ht 62.5 in | Wt 125.0 lb

## 2017-08-29 DIAGNOSIS — M5416 Radiculopathy, lumbar region: Secondary | ICD-10-CM | POA: Diagnosis not present

## 2017-08-29 DIAGNOSIS — M5412 Radiculopathy, cervical region: Secondary | ICD-10-CM | POA: Diagnosis not present

## 2017-08-29 DIAGNOSIS — M25511 Pain in right shoulder: Secondary | ICD-10-CM

## 2017-08-29 DIAGNOSIS — M5010 Cervical disc disorder with radiculopathy, unspecified cervical region: Secondary | ICD-10-CM | POA: Diagnosis not present

## 2017-08-29 LAB — PAIN MGMT, PROFILE 8 W/CONF, U
6 Acetylmorphine: NEGATIVE ng/mL (ref <10)
Alcohol Metabolites: NEGATIVE ng/mL (ref <500)
Amphetamines: NEGATIVE ng/mL (ref <500)
Benzodiazepines: NEGATIVE ng/mL (ref <100)
Buprenorphine, Urine: NEGATIVE ng/mL (ref <5)
Cocaine Metabolite: NEGATIVE ng/mL (ref <150)
Codeine: NEGATIVE ng/mL (ref <50)
Creatinine: 17.5 mg/dL — ABNORMAL LOW
Hydrocodone: 145 ng/mL — ABNORMAL HIGH (ref <50)
Hydromorphone: 64 ng/mL — ABNORMAL HIGH (ref <50)
MDMA: NEGATIVE ng/mL (ref <500)
Marijuana Metabolite: NEGATIVE ng/mL (ref <20)
Morphine: NEGATIVE ng/mL (ref <50)
Norhydrocodone: 256 ng/mL — ABNORMAL HIGH (ref <50)
Opiates: POSITIVE ng/mL — AB (ref <100)
Oxidant: NEGATIVE ug/mL (ref <200)
Oxycodone: NEGATIVE ng/mL (ref <100)
Specific Gravity: 1.006 (ref >=1.0)
pH: 5.73 (ref 4.5–9.0)

## 2017-08-29 NOTE — Patient Instructions (Signed)
Good to see you  I am so sorry you are in so much pain.  We tried trigger pint injections today  Put in orders for epidural of your neck and back just in case and you can call them at [phone] if you want Continue the pennsaid, ice and vitamins See me again in 4-6 weeks

## 2017-08-29 NOTE — Assessment & Plan Note (Signed)
Severe overall.  We discussed with patient again about the potential for an epidural which patient will consider.  Patient wants to avoid these if possible.  Last one was greater than 6 months ago.  Patient has responded to them in the past though.

## 2017-08-29 NOTE — Assessment & Plan Note (Signed)
I believe worsening pain again.  Patient has been admitted for a small bowel obstruction and do not feel increasing any of her narcotics would be beneficial but has responded to the epidurals in the past will order another one.  Patient is a high risk surgical candidate secondary to her lungs.  Patient will need to continue with these other type of treatment options.  Follow-up with me again 2 weeks after the injection.

## 2017-08-29 NOTE — Assessment & Plan Note (Signed)
Injection given today.  Discussed icing regimen and home exercises.  Discussed which activities to do which wants to avoid.  Increase activity as tolerated.  Likely more of a cervical radiculopathy.  Also has known rotator cuff arthropathy and possibly need shoulder injections at follow-up.

## 2017-08-30 ENCOUNTER — Telehealth: Payer: Self-pay | Admitting: Internal Medicine

## 2017-08-30 NOTE — Telephone Encounter (Signed)
Copied from [place]. Topic: Quick Communication - See Telephone Encounter >> Aug 30, 2017 11:28 AM Percell Belt A wrote: CRM for notification. See Telephone encounter for: 08/30/17.  Pt called in and stated that she is not feeling any better and would like to know if she could get another round of meds to help.  She took both meds (antibiotics and prednisone) till the end but not any better  Please advise  Best number  [place] on [facility]

## 2017-08-30 NOTE — Telephone Encounter (Signed)
She should come in and have someone listen to her lungs to hear if she is getting better.

## 2017-08-30 NOTE — Telephone Encounter (Signed)
Can you please call and make an appointment for patient. Thank you

## 2017-09-07 ENCOUNTER — Other Ambulatory Visit: Payer: Self-pay | Admitting: Internal Medicine

## 2017-09-07 NOTE — Telephone Encounter (Signed)
Hydrocodone refill Last Refill:08/08/17 #60 Last OV: 08/22/17 PCP: Dr. Sharlet Salina [facility]acy: [facility] [address] [facility]

## 2017-09-07 NOTE — Telephone Encounter (Unsigned)
Copied from [place] [id number]. Topic: Quick Communication - See Telephone Encounter >> Sep 07, 2017  3:09 PM Hewitt Shorts wrote: Pt is needing a refill on hydrocodone   [facility] [place] number [phone]

## 2017-09-10 NOTE — Telephone Encounter (Signed)
Routing to dr Ronnald Ramp, please advise in the absence of dr crawford, thanks

## 2017-09-12 ENCOUNTER — Encounter: Payer: Self-pay | Admitting: Internal Medicine

## 2017-09-12 ENCOUNTER — Ambulatory Visit (INDEPENDENT_AMBULATORY_CARE_PROVIDER_SITE_OTHER)
Admission: RE | Admit: 2017-09-12 | Discharge: 2017-09-12 | Disposition: A | Discharge status: Home / Self Care | Payer: Medicare Other | Source: Ambulatory Visit | Attending: Internal Medicine | Admitting: Internal Medicine

## 2017-09-12 ENCOUNTER — Ambulatory Visit (INDEPENDENT_AMBULATORY_CARE_PROVIDER_SITE_OTHER): Payer: Medicare Other | Admitting: Internal Medicine

## 2017-09-12 ENCOUNTER — Other Ambulatory Visit: Payer: Self-pay

## 2017-09-12 ENCOUNTER — Telehealth: Payer: Self-pay | Admitting: Internal Medicine

## 2017-09-12 VITALS — BP 120/82 | HR 60 | Temp 98.3°F | Ht 60.25 in | Wt 123.0 lb

## 2017-09-12 DIAGNOSIS — R05 Cough: Secondary | ICD-10-CM | POA: Diagnosis not present

## 2017-09-12 DIAGNOSIS — J441 Chronic obstructive pulmonary disease with (acute) exacerbation: Secondary | ICD-10-CM

## 2017-09-12 DIAGNOSIS — J449 Chronic obstructive pulmonary disease, unspecified: Secondary | ICD-10-CM | POA: Diagnosis not present

## 2017-09-12 MED ORDER — HYDROCODONE-ACETAMINOPHEN 5-325 MG PO TABS: 1.0000 | ORAL_TABLET | Freq: Two times a day (BID) | ORAL | 0 refills | Status: DC | PRN

## 2017-09-12 MED ORDER — PREDNISONE 20 MG PO TABS: 40.0000 mg | ORAL_TABLET | Freq: Every day | ORAL | 0 refills | Status: DC

## 2017-09-12 MED ORDER — DOXYCYCLINE HYCLATE 100 MG PO TABS: 100.0000 mg | ORAL_TABLET | Freq: Two times a day (BID) | ORAL | 0 refills | Status: DC

## 2017-09-12 NOTE — Patient Instructions (Signed)
We have sent in a refill of the doxycycline and the prednisone.   We are checking the x-ray today.   We have sent in the refill of the hydrocodone.

## 2017-09-12 NOTE — Telephone Encounter (Signed)
Copied from [place] [id number]. Topic: Quick Communication - Rx Refill/Question >> Sep 12, 2017 12:07 PM Scherrie Gerlach wrote: Medication: predniSONE (DELTASONE) 20 MG tablet  [facility] because [facility].  They want to know if ok to switch to a 10 mg and double the dose?  [place], [place] [phone] (Phone) [phone] (Fax)

## 2017-09-12 NOTE — Telephone Encounter (Signed)
Pharmacy informed.

## 2017-09-12 NOTE — Telephone Encounter (Signed)
Yes- this is fine; thanks-

## 2017-09-12 NOTE — Progress Notes (Signed)
   Subjective:    Patient ID: Erica [name], female    DOB: Apr 13, 1950, 67 y.o.   MRN: [id number]  HPI The patient is a 67 YO female coming in for cough and SOB ongoing for about 1 month now. She was treated with antibiotics and prednisone about 2-3 weeks ago and this did provide some relief. She then started feeling worse again. She is coughing all the time. Minimal sputum production which is green. Prior pneumonia and does not feel the same to her. She does have mild sinus drainage but this is overall improving. Denies fevers or chills. Continues to smoke about the same amount. Denies being able to quit at this time. She is concerned as her illness does not usually go on for so long and she is not improving at all.   Review of Systems  Constitutional: Positive for activity change and appetite change. Negative for chills, fatigue, fever and unexpected weight change.  HENT: Positive for congestion and rhinorrhea. Negative for ear discharge, ear pain, postnasal drip, sinus pressure, sinus pain, sneezing, sore throat, tinnitus, trouble swallowing and voice change.   Eyes: Negative.   Respiratory: Positive for cough and shortness of breath. Negative for chest tightness and wheezing.   Cardiovascular: Negative.   Gastrointestinal: Negative.   Musculoskeletal: Positive for arthralgias and myalgias.  Neurological: Negative.       Objective:   Physical Exam  Constitutional: She is oriented to person, place, and time. She appears well-developed and well-nourished. No distress.  HENT:  Head: Normocephalic and atraumatic.  Oropharynx with redness and clear drainage, nose with swollen turbinates, TMs normal bilaterally  Eyes: EOM are normal.  Neck: Normal range of motion. No thyromegaly present.  Cardiovascular: Normal rate and regular rhythm.  Pulmonary/Chest: Effort normal. No respiratory distress. She has wheezes. She has no rales.  Stable lung exam with some rhonchi in the bases and  expiratory wheezing mild.   Abdominal: Soft.  Lymphadenopathy:    She has no cervical adenopathy.  Neurological: She is alert and oriented to person, place, and time.  Skin: Skin is warm and dry.  Psychiatric: She has a normal mood and affect.   Vitals:   09/12/17 0930  BP: 120/82  Pulse: 60  Temp: 98.3 F (36.8 C)  TempSrc: Oral  SpO2: 98%  Weight: 123 lb (55.8 kg)  Height: 5' 0.25" (1.53 m)      Assessment & Plan:

## 2017-09-12 NOTE — Telephone Encounter (Signed)
Is this okay to do. Please advise per Dr. Charlynne Cousins absence

## 2017-09-14 ENCOUNTER — Encounter: Payer: Self-pay | Admitting: Internal Medicine

## 2017-09-14 NOTE — Assessment & Plan Note (Addendum)
With exacerbation that has not responded to prior treatment. Rx for prednisone longer course and repeat course doxycycline. She is strongly advised to quit smoking. Checking CXR given lack of response to prior treatment. Continue albuterol prn for SOB. We talked about long term controlled inhaler but she did not want that today.

## 2017-09-25 ENCOUNTER — Ambulatory Visit
Admission: RE | Admit: 2017-09-25 | Discharge: 2017-09-25 | Disposition: A | Discharge status: Home / Self Care | Payer: Medicare Other | Source: Ambulatory Visit | Attending: Family Medicine | Admitting: Family Medicine

## 2017-09-25 ENCOUNTER — Other Ambulatory Visit: Payer: Self-pay | Admitting: Family Medicine

## 2017-09-25 DIAGNOSIS — M5416 Radiculopathy, lumbar region: Secondary | ICD-10-CM

## 2017-09-25 DIAGNOSIS — M5126 Other intervertebral disc displacement, lumbar region: Secondary | ICD-10-CM | POA: Diagnosis not present

## 2017-09-25 MED ORDER — METHYLPREDNISOLONE ACETATE 40 MG/ML INJ SUSP (RADIOLOG
120.0000 mg | Freq: Once | INTRAMUSCULAR | Status: AC
  Administered 2017-09-25: 120 mg via EPIDURAL

## 2017-09-25 MED ORDER — IOPAMIDOL (ISOVUE-M 200) INJECTION 41%
1.0000 mL | Freq: Once | INTRAMUSCULAR | Status: AC
  Administered 2017-09-25: 1 mL via EPIDURAL

## 2017-09-25 NOTE — Discharge Instructions (Signed)

## 2017-10-09 NOTE — Progress Notes (Signed)
[name] [address] [place], [place] [address] Phone: [phone] Subjective:    CC: neck pain and back pain   [id number]  Erica [name] is a 67 y.o. female coming in with complaint of neck pain and back pain.   Neck pain. Discussed icing patient has not been doing this.  Patient has not had the epidural yet.  Continues to have the chronic pain.  Mild radiation of the arm states continues to have a mild numbness but nothing severe.  Patient was given trigger point injections last visit in the neck area that was somewhat improved  Patient also has low back pain.  Has had significant work-up and did have an epidural 2 weeks ago.  States that it did help with the majority of the back pain.  Continues to try to stay active.  Continues to have some of the radicular symptoms on the right side.     Past Medical History:  Diagnosis Date  . ADENOCARCINOMA, COLON, HX OF 03/2009  . ANEMIA   . BACK PAIN, LUMBAR   . Cancer [facility]) 2010   colon cancer  . CHF (congestive heart failure) ([place])   . COPD (chronic obstructive pulmonary disease) with emphysema ([place])   . Crohn's  02/2010   ileal ulcers, intol of entercort--refuses treatment  . Duodenitis 03/29/2017   Peptic  . GERD   . History of transfusion of whole blood   . HYPERLIPIDEMIA   . HYPERTENSION   . Microscopic hematuria    chronic  . Obstruction of intestine or colon (HCC)    adhesions  . OSTEOARTHRITIS   . Rectal fissure    Possible fissure  . Rotator cuff tear   . Takotsubo syndrome 12/2008  . URINARY INCONTINENCE    Past Surgical History:  Procedure Laterality Date  . ABDOMINAL HYSTERECTOMY  1994  . APPENDECTOMY  1964  . BLADDER SUSPENSION    . CESAREAN SECTION     x 3  . CHOLECYSTECTOMY  1995  . COLON SURGERY  03/2009   hemicolectomy colon cancer  . COLONOSCOPY W/ BIOPSIES AND POLYPECTOMY  01/24/2011   (Crohn's)ileitis, internal hemorrhoids  . ECTOPIC PREGNANCY SURGERY    .  ESOPHAGOGASTRODUODENOSCOPY    . FACIAL COSMETIC SURGERY    . HAND SURGERY Bilateral 1991  . KNEE SURGERY Right 1981  . right knee replacement  03/2010  . supraorbital fracture surgery    . TONSILLECTOMY  1952  . UPPER GASTROINTESTINAL ENDOSCOPY  05/16/2010   normal  . UTERINE SUSPENSION    . VIDEO BRONCHOSCOPY Bilateral 12/18/2014   Procedure: VIDEO BRONCHOSCOPY WITHOUT FLUORO;  Surgeon: Tanda Rockers, MD;  Location: [facility];  Service: Cardiopulmonary;  Laterality: Bilateral;   Social History   Socioeconomic History  . Marital status: Married    Spouse name: Not on file  . Number of children: Not on file  . Years of education: Not on file  . Highest education level: Not on file  Occupational History  . Occupation: Retired     Fish farm manager: UNEMPLOYED  Social Needs  . Financial resource strain: Not on file  . Food insecurity:    Worry: Not on file    Inability: Not on file  . Transportation needs:    Medical: Not on file    Non-medical: Not on file  Tobacco Use  . Smoking status: Current Every Day Smoker    Packs/day: 1.00    Years: 50.00    Pack years: 50.00  Types: Cigarettes  . Smokeless tobacco: Never Used  Substance and Sexual Activity  . Alcohol use: No    Alcohol/week: 0.0 oz  . Drug use: No  . Sexual activity: Not on file  Lifestyle  . Physical activity:    Days per week: Not on file    Minutes per session: Not on file  . Stress: Not on file  Relationships  . Social connections:    Talks on phone: Not on file    Gets together: Not on file    Attends religious service: Not on file    Active member of club or organization: Not on file    Attends meetings of clubs or organizations: Not on file    Relationship status: Not on file  Other Topics Concern  . Not on file  Social History Narrative   Lives with husband and 2 grand dtr; 9 and 16   Allergies  Allergen Reactions  . Morphine Nausea And Vomiting   Family History  Problem Relation Age of  Onset  . Colon cancer Father 65  . Hypertension Father   . Heart disease Father   . Kidney disease Father   . Colon cancer Sister 64  . Liver cancer Sister   . Other Sister        amyloidosis  . Throat cancer Mother   . Arthritis Other        Parent, other relative     Past medical history, social, surgical and family history all reviewed in electronic medical record.  No pertanent information unless stated regarding to the chief complaint.   Review of Systems:Review of systems updated and as accurate as of 10/10/17  No headache, visual changes, nausea, vomiting, diarrhea, constipation, dizziness, abdominal pain, skin rash, fevers, chills, night sweats, weight loss, swollen lymph nodes,  chest pain, shortness of breath, mood changes.  Positive muscle aches and body aches  Objective  Blood pressure 132/70, pulse 82, height 5' (1.524 m), weight 126 lb (57.2 kg), SpO2 95 %. Systems examined below as of 10/10/17   General: No apparent distress alert and oriented x3 mood and affect normal, dressed appropriately.  HEENT: Pupils equal, extraocular movements intact  Respiratory: Patient's speak in full sentences and does not appear short of breath  Cardiovascular: No lower extremity edema, non tender, no erythema  Skin: Warm dry intact with no signs of infection or rash on extremities or on axial skeleton.  Abdomen: Soft nontender  Neuro: Cranial nerves II through XII are intact, neurovascularly intact in all extremities with 2+ DTRs and 2+ pulses.  Lymph: No lymphadenopathy of posterior or anterior cervical chain or axillae bilaterally.  Gait normal with good balance and coordination.  MSK:  tender with loss range of motion andand symmetric strength and tone of shoulders, elbows, wrist, hip, knee and ankles bilaterally.  Neck exam shows diffuse tenderness.  Positive Spurling's on the right side.  4 out of 5 strength noted of the upper extremity compared to the contralateral side and  especially in the C6 distribution.  Deep tendon reflexes though are intact.    Impression and Recommendations:     This case required medical decision making of moderate complexity.      Note: This dictation was prepared with Dragon dictation along with smaller phrase technology. Any transcriptional errors that result from this process are unintentional.

## 2017-10-10 ENCOUNTER — Encounter: Payer: Self-pay | Admitting: Family Medicine

## 2017-10-10 ENCOUNTER — Ambulatory Visit (INDEPENDENT_AMBULATORY_CARE_PROVIDER_SITE_OTHER): Payer: Medicare Other | Admitting: Family Medicine

## 2017-10-10 ENCOUNTER — Other Ambulatory Visit: Payer: Self-pay | Admitting: Internal Medicine

## 2017-10-10 DIAGNOSIS — M5010 Cervical disc disorder with radiculopathy, unspecified cervical region: Secondary | ICD-10-CM

## 2017-10-10 MED ORDER — HYDROCODONE-ACETAMINOPHEN 5-325 MG PO TABS: 1.0000 | ORAL_TABLET | Freq: Two times a day (BID) | ORAL | 0 refills | Status: DC | PRN

## 2017-10-10 NOTE — Telephone Encounter (Signed)
Patient is requesting a refill on her HYDROcodone-acetaminophen (NORCO/VICODIN) 5-325 MG tablet to [facility]  on [address].

## 2017-10-10 NOTE — Addendum Note (Signed)
Addended by: Pricilla Holm A on: 10/10/2017 09:56 AM   Modules accepted: Orders

## 2017-10-10 NOTE — Assessment & Plan Note (Addendum)
Not any significant comorbidities contributing.  We discussed with patient again at great length.  Continues to gain chronic pain medications from another provider.  Encourage patient to follow through with an epidural of the neck.  Can do possible trigger point injections if necessary.  Encourage patient to stay active.  Discussed icing regimen and home exercises.  Discussed which activities to do which wants to avoid.  Follow-up again in 4 to 8 weeks  Spent  25 minutes with patient face-to-face and had greater than 50% of counseling including as described above in assessment and plan.

## 2017-10-10 NOTE — Telephone Encounter (Signed)
Control database checked last refill: 09/13/2017 LOV: 08/22/2017 for Pain management  NOV: 11/22/2017

## 2017-10-10 NOTE — Patient Instructions (Addendum)
Good to see you  Erica [name] is your friend.  Lets see what the injection does.  See me again 3 weeks after the injection if not better then consider more trigger point

## 2017-10-19 ENCOUNTER — Other Ambulatory Visit: Payer: Medicare Other

## 2017-10-24 ENCOUNTER — Ambulatory Visit
Admission: RE | Admit: 2017-10-24 | Discharge: 2017-10-24 | Disposition: A | Discharge status: Home / Self Care | Payer: Medicare Other | Source: Ambulatory Visit | Attending: Family Medicine | Admitting: Family Medicine

## 2017-10-24 DIAGNOSIS — M5412 Radiculopathy, cervical region: Secondary | ICD-10-CM

## 2017-10-24 MED ORDER — IOPAMIDOL (ISOVUE-M 300) INJECTION 61%
1.0000 mL | Freq: Once | INTRAMUSCULAR | Status: AC | PRN
  Administered 2017-10-24: 1 mL via EPIDURAL

## 2017-10-24 MED ORDER — TRIAMCINOLONE ACETONIDE 40 MG/ML IJ SUSP (RADIOLOGY)
60.0000 mg | Freq: Once | INTRAMUSCULAR | Status: AC
  Administered 2017-10-24: 60 mg via EPIDURAL

## 2017-10-31 ENCOUNTER — Ambulatory Visit (INDEPENDENT_AMBULATORY_CARE_PROVIDER_SITE_OTHER): Payer: Medicare Other | Admitting: Internal Medicine

## 2017-10-31 ENCOUNTER — Encounter: Payer: Self-pay | Admitting: Internal Medicine

## 2017-10-31 ENCOUNTER — Ambulatory Visit: Payer: Self-pay | Admitting: *Deleted

## 2017-10-31 ENCOUNTER — Telehealth: Payer: Self-pay | Admitting: Internal Medicine

## 2017-10-31 VITALS — BP 120/80 | HR 78 | Temp 97.8°F | Ht 60.0 in | Wt 128.0 lb

## 2017-10-31 DIAGNOSIS — R21 Rash and other nonspecific skin eruption: Secondary | ICD-10-CM

## 2017-10-31 DIAGNOSIS — J441 Chronic obstructive pulmonary disease with (acute) exacerbation: Secondary | ICD-10-CM

## 2017-10-31 DIAGNOSIS — R0602 Shortness of breath: Secondary | ICD-10-CM | POA: Diagnosis not present

## 2017-10-31 DIAGNOSIS — R0789 Other chest pain: Secondary | ICD-10-CM | POA: Diagnosis not present

## 2017-10-31 MED ORDER — PREDNISONE 20 MG PO TABS: 40.0000 mg | ORAL_TABLET | Freq: Every day | ORAL | 0 refills | Status: DC

## 2017-10-31 MED ORDER — METHYLPREDNISOLONE ACETATE 40 MG/ML IJ SUSP
40.0000 mg | Freq: Once | INTRAMUSCULAR | Status: AC
  Administered 2017-10-31: 40 mg via INTRAMUSCULAR

## 2017-10-31 NOTE — Telephone Encounter (Signed)
Sent in, thanks.

## 2017-10-31 NOTE — Telephone Encounter (Signed)
Pt called with complaints of labored breathing with pain between her breast; she also states that she fell in the bushes 2 days ago and was itching after coming in contact with bushes; she states that her eyes are itching and she is itching all over; has bumps on her arms and face  the pt also says that her inhaler is not helping; benadryl and calamine lotion has not helped; the pt says that her chest bone between her breast hurts with deep inspiration, and she experiences wheezing when she lays down;the pt says that she does not like going to ED; she also says that she used her husband's pulse oximeter and her sats were 94% recommendations given per nurse triage protocol to include seeing a physician within 4 hours; pt offered and accepted appointment with Dr Pricilla Holm, [facility], today at 0900; she verbalizes understanding; will route to office for notification of this upcoming appointment.  Reason for Disposition . [1] Longstanding difficulty breathing AND [2] not responding to usual therapy  Answer Assessment - Initial Assessment Questions 1. RESPIRATORY STATUS: "Describe your breathing?" (e.g., wheezing, shortness of breath, unable to speak, severe coughing)      Labored, wheezing 2. ONSET: "When did this breathing problem begin?"      10/29/17 3. PATTERN "Does the difficult breathing come and go, or has it been constant since it started?"      intermittent 4. SEVERITY: "How bad is your breathing?" (e.g., mild, moderate, severe)    - MILD: No SOB at rest, mild SOB with walking, speaks normally in sentences, can lay down, no retractions, pulse < 100.    - MODERATE: SOB at rest, SOB with minimal exertion and prefers to sit, cannot lie down flat, speaks in phrases, mild retractions, audible wheezing, pulse 100-120.    - SEVERE: Very SOB at rest, speaks in single words, struggling to breathe, sitting hunched forward, retractions, pulse > 120      moderate 5. RECURRENT SYMPTOM: "Have you had  difficulty breathing before?" If so, ask: "When was the last time?" and "What happened that time?"      Yes, when sick pneumonia and COPD 6. CARDIAC HISTORY: "Do you have any history of heart disease?" (e.g., heart attack, angina, bypass surgery, angioplasty)     takotsubo syndrome 7. LUNG HISTORY: "Do you have any history of lung disease?"  (e.g., pulmonary embolus, asthma, emphysema)     COPD  8. CAUSE: "What do you think is causing the breathing problem?"       maybe allergic reaction  9. OTHER SYMPTOMS: "Do you have any other symptoms? (e.g., dizziness, runny nose, cough, chest pain, fever)     Itching and rash due to falling in bushes  10. PREGNANCY: "Is there any chance you are pregnant?" "When was your last menstrual period?"       No, hysterectomy 11. TRAVEL: "Have you traveled out of the country in the last month?" (e.g., travel history, exposures)      no  Protocols used: BREATHING DIFFICULTY-A-AH

## 2017-10-31 NOTE — Assessment & Plan Note (Signed)
Given prior history, smoking history needed EKG today which was done and without changes from prior. Treating her for allergic reaction causing COPD exacerbation.

## 2017-10-31 NOTE — Assessment & Plan Note (Signed)
Rx for prednisone for allergic reaction today. Advised to take benadryl for itching and use topical agents. She is advised to start zyrtec BID for the next 5 days to help with itching. Given depo-medrol 40 mg IM today.

## 2017-10-31 NOTE — Telephone Encounter (Signed)
Copied from [place] [id number]. Topic: General - Other >> Oct 31, 2017 11:03 AM Cecelia Byars, NT wrote: Reason for CRM Patient was seen today and was told a prescription for prednisone would be called in at [place], [place] [phone] (Phone) [phone] (Fax during her visit please advise

## 2017-10-31 NOTE — Assessment & Plan Note (Addendum)
Treat as exacerbation with prednisone today and depo-medrol 40 mg IM, lungs with more wheezing. Could be related to allergic reaction and no antibiotics indicated.

## 2017-10-31 NOTE — Telephone Encounter (Signed)
Noted  

## 2017-10-31 NOTE — Progress Notes (Signed)
   Subjective:    Patient ID: Erica [name], female    DOB: June 16, 1950, 67 y.o.   MRN: [id number]  HPI The patient is a 68 YO female coming in for rash (on the arms right side, fell into bush, took benadryl which did not help, she is itching all over, some SOB and feels like tightness in her throat, no swelling on her arms or legs, no swelling in her face) and shortness of breath (has COPD due to smoking, continues to smoke, using albuterol prn only currently, getting SOB with many activities, even lying down she is SOB, denies chest pains but some pressure) as well as chest pressure (hard to breathe and sometimes feels tight, had prior heart attack without many symptoms, sob even with sitting, not more sob with lying down, denies swelling in legs).   Review of Systems  Constitutional: Positive for activity change. Negative for appetite change, chills, fatigue, fever and unexpected weight change.  HENT: Negative for congestion, ear discharge, ear pain, postnasal drip, rhinorrhea, sinus pressure, sinus pain, sneezing, sore throat, tinnitus, trouble swallowing and voice change.   Eyes: Negative.   Respiratory: Positive for cough, chest tightness, shortness of breath and wheezing.   Cardiovascular: Negative.  Negative for chest pain, palpitations and leg swelling.  Gastrointestinal: Negative.  Negative for abdominal distention, abdominal pain, constipation, diarrhea, nausea and vomiting.  Musculoskeletal: Positive for arthralgias. Negative for myalgias.  Skin: Positive for rash.       itching  Neurological: Negative.   Psychiatric/Behavioral: Negative.       Objective:   Physical Exam  Constitutional: She is oriented to person, place, and time. She appears well-developed and well-nourished. No distress.  HENT:  Head: Normocephalic and atraumatic.  No oropharynx swelling or facial or mouth or lip swelling.  Eyes: EOM are normal.  Neck: Normal range of motion.  Cardiovascular: Normal rate  and regular rhythm.  Pulmonary/Chest: Effort normal and breath sounds normal. No respiratory distress. She has no wheezes. She has no rales.  Chronic lung changes, some increased wheezing from prior  Abdominal: Soft. Bowel sounds are normal. She exhibits no distension. There is no tenderness. There is no rebound.  Musculoskeletal: She exhibits no edema.  Neurological: She is alert and oriented to person, place, and time. Coordination normal.  Skin: Skin is warm and dry.  Some stigmata of scratching, no visible rash or welts on exam.   Psychiatric: She has a normal mood and affect.   Vitals:   10/31/17 0845  BP: 120/80  Pulse: 78  Temp: 97.8 F (36.6 C)  TempSrc: Oral  SpO2: 98%  Weight: 128 lb (58.1 kg)  Height: 5' (1.524 m)   EKG: Rate 62, sinus, p waves inverted, low voltage, intervals normal, axis altered, no st or t wave changes, no significant change from prior Oct 2018    Assessment & Plan:  Depo-medrol 40 mg IM given at visit

## 2017-10-31 NOTE — Patient Instructions (Signed)
The EKG is the same from last year.   We have given you a shot today and sent in prednisone prescription. Take 2 prednisone pills daily for 5 days.   We would like you to take zyrtec 1 pill twice a day for the next 5 days to help the allergic reaction.   Benadryl is also okay to take if needed for itching although it can make you drowsy and should not be taken within 2 hours of hydrocodone.

## 2017-11-12 ENCOUNTER — Telehealth: Payer: Self-pay | Admitting: Internal Medicine

## 2017-11-12 MED ORDER — HYDROCODONE-ACETAMINOPHEN 5-325 MG PO TABS: 1.0000 | ORAL_TABLET | Freq: Two times a day (BID) | ORAL | 0 refills | Status: DC | PRN

## 2017-11-12 NOTE — Telephone Encounter (Signed)
Vicodin 5-325 refill Last Refill:10/12/17 # 60 Last OV: 10/31/17 PCP: Erica [name] [facility]acy:[facility] [address], [place], [place]

## 2017-11-12 NOTE — Telephone Encounter (Signed)
Please convert to rx refill encounter in the future as this is a med refill and not patient call.

## 2017-11-12 NOTE — Telephone Encounter (Signed)
Copied from [place] [id number]. Topic: Quick Communication - Rx Refill/Question >> Nov 12, 2017  1:41 PM Nils Flack, [name] wrote: Medication:  HYDROcodone-acetaminophen (NORCO/VICODIN) 5-325 MG tablet Has the patient contacted their pharmacy? No. (Agent: If no, request that the patient contact the pharmacy for the refill.) (Agent: If yes, when and what did the pharmacy advise?)  Preferred Pharmacy (with phone number or street name): [facility] [address]   Agent: Please be advised that RX refills may take up to 3 business days. We ask that you follow-up with your pharmacy.

## 2017-11-14 ENCOUNTER — Other Ambulatory Visit: Payer: Self-pay

## 2017-11-14 ENCOUNTER — Emergency Department (HOSPITAL_COMMUNITY): Payer: Medicare Other

## 2017-11-14 ENCOUNTER — Encounter: Payer: Self-pay | Admitting: Family

## 2017-11-14 ENCOUNTER — Inpatient Hospital Stay (HOSPITAL_COMMUNITY)
Admission: EM | Admit: 2017-11-14 | Discharge: 2017-11-16 | DRG: 191 | Disposition: A | Discharge status: Home / Self Care | Payer: Medicare Other | Attending: Internal Medicine | Admitting: Internal Medicine

## 2017-11-14 ENCOUNTER — Encounter (HOSPITAL_COMMUNITY): Payer: Self-pay | Admitting: *Deleted

## 2017-11-14 ENCOUNTER — Ambulatory Visit (INDEPENDENT_AMBULATORY_CARE_PROVIDER_SITE_OTHER): Payer: Medicare Other | Admitting: Family

## 2017-11-14 VITALS — BP 120/70 | HR 109 | Temp 98.7°F | Ht 60.0 in | Wt 127.1 lb

## 2017-11-14 DIAGNOSIS — M5416 Radiculopathy, lumbar region: Secondary | ICD-10-CM | POA: Diagnosis present

## 2017-11-14 DIAGNOSIS — K219 Gastro-esophageal reflux disease without esophagitis: Secondary | ICD-10-CM | POA: Diagnosis present

## 2017-11-14 DIAGNOSIS — E785 Hyperlipidemia, unspecified: Secondary | ICD-10-CM | POA: Diagnosis not present

## 2017-11-14 DIAGNOSIS — J410 Simple chronic bronchitis: Secondary | ICD-10-CM | POA: Diagnosis present

## 2017-11-14 DIAGNOSIS — I252 Old myocardial infarction: Secondary | ICD-10-CM

## 2017-11-14 DIAGNOSIS — E559 Vitamin D deficiency, unspecified: Secondary | ICD-10-CM | POA: Diagnosis present

## 2017-11-14 DIAGNOSIS — Z800 Family history of malignant neoplasm of digestive organs: Secondary | ICD-10-CM

## 2017-11-14 DIAGNOSIS — R52 Pain, unspecified: Secondary | ICD-10-CM | POA: Diagnosis not present

## 2017-11-14 DIAGNOSIS — Z6823 Body mass index (BMI) 23.0-23.9, adult: Secondary | ICD-10-CM

## 2017-11-14 DIAGNOSIS — Z90710 Acquired absence of both cervix and uterus: Secondary | ICD-10-CM

## 2017-11-14 DIAGNOSIS — Z841 Family history of disorders of kidney and ureter: Secondary | ICD-10-CM

## 2017-11-14 DIAGNOSIS — F17210 Nicotine dependence, cigarettes, uncomplicated: Secondary | ICD-10-CM

## 2017-11-14 DIAGNOSIS — K5000 Crohn's disease of small intestine without complications: Secondary | ICD-10-CM | POA: Diagnosis present

## 2017-11-14 DIAGNOSIS — I110 Hypertensive heart disease with heart failure: Secondary | ICD-10-CM | POA: Diagnosis present

## 2017-11-14 DIAGNOSIS — Z96651 Presence of right artificial knee joint: Secondary | ICD-10-CM | POA: Diagnosis present

## 2017-11-14 DIAGNOSIS — Z8249 Family history of ischemic heart disease and other diseases of the circulatory system: Secondary | ICD-10-CM

## 2017-11-14 DIAGNOSIS — J441 Chronic obstructive pulmonary disease with (acute) exacerbation: Secondary | ICD-10-CM

## 2017-11-14 DIAGNOSIS — J9601 Acute respiratory failure with hypoxia: Secondary | ICD-10-CM | POA: Diagnosis not present

## 2017-11-14 DIAGNOSIS — Z808 Family history of malignant neoplasm of other organs or systems: Secondary | ICD-10-CM

## 2017-11-14 DIAGNOSIS — J439 Emphysema, unspecified: Secondary | ICD-10-CM | POA: Diagnosis present

## 2017-11-14 DIAGNOSIS — I5032 Chronic diastolic (congestive) heart failure: Secondary | ICD-10-CM | POA: Diagnosis present

## 2017-11-14 DIAGNOSIS — M1120 Other chondrocalcinosis, unspecified site: Secondary | ICD-10-CM | POA: Diagnosis present

## 2017-11-14 DIAGNOSIS — Z791 Long term (current) use of non-steroidal anti-inflammatories (NSAID): Secondary | ICD-10-CM | POA: Diagnosis not present

## 2017-11-14 DIAGNOSIS — Z9049 Acquired absence of other specified parts of digestive tract: Secondary | ICD-10-CM

## 2017-11-14 DIAGNOSIS — R64 Cachexia: Secondary | ICD-10-CM | POA: Diagnosis present

## 2017-11-14 DIAGNOSIS — Z86010 Personal history of colonic polyps: Secondary | ICD-10-CM | POA: Diagnosis not present

## 2017-11-14 DIAGNOSIS — I10 Essential (primary) hypertension: Secondary | ICD-10-CM | POA: Diagnosis not present

## 2017-11-14 DIAGNOSIS — Z885 Allergy status to narcotic agent status: Secondary | ICD-10-CM

## 2017-11-14 DIAGNOSIS — Z720 Tobacco use: Secondary | ICD-10-CM

## 2017-11-14 DIAGNOSIS — R05 Cough: Secondary | ICD-10-CM | POA: Diagnosis not present

## 2017-11-14 DIAGNOSIS — Z85038 Personal history of other malignant neoplasm of large intestine: Secondary | ICD-10-CM | POA: Diagnosis not present

## 2017-11-14 DIAGNOSIS — M1990 Unspecified osteoarthritis, unspecified site: Secondary | ICD-10-CM | POA: Diagnosis present

## 2017-11-14 DIAGNOSIS — R0689 Other abnormalities of breathing: Secondary | ICD-10-CM | POA: Diagnosis not present

## 2017-11-14 DIAGNOSIS — R0602 Shortness of breath: Secondary | ICD-10-CM | POA: Diagnosis not present

## 2017-11-14 DIAGNOSIS — R0902 Hypoxemia: Secondary | ICD-10-CM | POA: Diagnosis not present

## 2017-11-14 HISTORY — DX: Chronic obstructive pulmonary disease with (acute) exacerbation: J44.1

## 2017-11-14 LAB — BASIC METABOLIC PANEL
Anion gap: 10 (ref 5–15)
BUN: 16 mg/dL (ref 8–23)
CO2: 28 mmol/L (ref 22–32)
Calcium: 9 mg/dL (ref 8.9–10.3)
Chloride: 103 mmol/L (ref 98–111)
Creatinine, Ser: 0.72 mg/dL (ref 0.44–1.00)
GFR calc Af Amer: >60 mL/min (ref >60)
GFR calc non Af Amer: >60 mL/min (ref >60)
Glucose, Bld: 93 mg/dL (ref 70–99)
Potassium: 4.1 mmol/L (ref 3.5–5.1)
Sodium: 141 mmol/L (ref 135–145)

## 2017-11-14 LAB — CBC WITH DIFFERENTIAL/PLATELET
Basophils Absolute: 0 10*3/uL (ref 0.0–0.1)
Basophils Relative: 0 %
Eosinophils Absolute: 0.1 10*3/uL (ref 0.0–0.7)
Eosinophils Relative: 1 %
HCT: 40.7 % (ref 36.0–46.0)
Hemoglobin: 13.4 g/dL (ref 12.0–15.0)
Lymphocytes Relative: 28 %
Lymphs Abs: 2.1 10*3/uL (ref 0.7–4.0)
MCH: 31.2 pg (ref 26.0–34.0)
MCHC: 32.9 g/dL (ref 30.0–36.0)
MCV: 94.7 fL (ref 78.0–100.0)
Monocytes Absolute: 0.5 10*3/uL (ref 0.1–1.0)
Monocytes Relative: 7 %
Neutro Abs: 5 10*3/uL (ref 1.7–7.7)
Neutrophils Relative %: 64 %
Platelets: 219 10*3/uL (ref 150–400)
RBC: 4.3 MIL/uL (ref 3.87–5.11)
RDW: 13.8 % (ref 11.5–15.5)
WBC: 7.7 10*3/uL (ref 4.0–10.5)

## 2017-11-14 LAB — D-DIMER, QUANTITATIVE (NOT AT ARMC): D-Dimer, Quant: 0.5 ug/mL-FEU (ref 0.00–0.50)

## 2017-11-14 LAB — BRAIN NATRIURETIC PEPTIDE: B Natriuretic Peptide: 18.1 pg/mL (ref 0.0–100.0)

## 2017-11-14 LAB — I-STAT TROPONIN, ED: Troponin i, poc: 0.01 ng/mL (ref 0.00–0.08)

## 2017-11-14 LAB — I-STAT CG4 LACTIC ACID, ED: Lactic Acid, Venous: 1.05 mmol/L (ref 0.5–1.9)

## 2017-11-14 MED ORDER — ONDANSETRON HCL 4 MG/2ML IJ SOLN: 4.0000 mg | Freq: Four times a day (QID) | INTRAMUSCULAR | Status: DC | PRN

## 2017-11-14 MED ORDER — PREDNISONE 20 MG PO TABS
40.0000 mg | ORAL_TABLET | Freq: Every day | ORAL | Status: DC
  Administered 2017-11-15 – 2017-11-16 (×2): 40 mg via ORAL
  Filled 2017-11-14 (×2): qty 2

## 2017-11-14 MED ORDER — ACETAMINOPHEN 325 MG PO TABS: 650.0000 mg | ORAL_TABLET | Freq: Four times a day (QID) | ORAL | Status: DC | PRN

## 2017-11-14 MED ORDER — ONDANSETRON HCL 4 MG PO TABS: 4.0000 mg | ORAL_TABLET | Freq: Four times a day (QID) | ORAL | Status: DC | PRN

## 2017-11-14 MED ORDER — SODIUM CHLORIDE 0.9 % IV SOLN
1.0000 g | Freq: Once | INTRAVENOUS | Status: AC
  Administered 2017-11-14: 1 g via INTRAVENOUS
  Filled 2017-11-14: qty 10

## 2017-11-14 MED ORDER — METOPROLOL TARTRATE 25 MG PO TABS
25.0000 mg | ORAL_TABLET | Freq: Two times a day (BID) | ORAL | Status: DC
  Administered 2017-11-14 – 2017-11-16 (×4): 25 mg via ORAL
  Filled 2017-11-14 (×4): qty 1

## 2017-11-14 MED ORDER — HYDROCODONE-ACETAMINOPHEN 5-325 MG PO TABS
1.0000 | ORAL_TABLET | Freq: Two times a day (BID) | ORAL | Status: DC | PRN
  Administered 2017-11-15 – 2017-11-16 (×3): 1 via ORAL
  Filled 2017-11-14 (×3): qty 1

## 2017-11-14 MED ORDER — IPRATROPIUM-ALBUTEROL 0.5-2.5 (3) MG/3ML IN SOLN: 3.0000 mL | Freq: Once | RESPIRATORY_TRACT | Status: DC

## 2017-11-14 MED ORDER — IPRATROPIUM-ALBUTEROL 0.5-2.5 (3) MG/3ML IN SOLN
3.0000 mL | Freq: Two times a day (BID) | RESPIRATORY_TRACT | Status: DC
  Administered 2017-11-15 – 2017-11-16 (×3): 3 mL via RESPIRATORY_TRACT
  Filled 2017-11-14 (×3): qty 3

## 2017-11-14 MED ORDER — DOXYCYCLINE HYCLATE 100 MG PO TABS
100.0000 mg | ORAL_TABLET | Freq: Two times a day (BID) | ORAL | Status: DC
  Administered 2017-11-14 – 2017-11-16 (×4): 100 mg via ORAL
  Filled 2017-11-14 (×4): qty 1

## 2017-11-14 MED ORDER — IPRATROPIUM-ALBUTEROL 0.5-2.5 (3) MG/3ML IN SOLN
3.0000 mL | Freq: Once | RESPIRATORY_TRACT | Status: AC
  Administered 2017-11-14: 3 mL via RESPIRATORY_TRACT

## 2017-11-14 MED ORDER — METHYLPREDNISOLONE SODIUM SUCC 125 MG IJ SOLR
125.0000 mg | Freq: Once | INTRAMUSCULAR | Status: AC
  Administered 2017-11-14: 125 mg via INTRAVENOUS
  Filled 2017-11-14: qty 2

## 2017-11-14 MED ORDER — ACETAMINOPHEN 650 MG RE SUPP: 650.0000 mg | Freq: Four times a day (QID) | RECTAL | Status: DC | PRN

## 2017-11-14 MED ORDER — IPRATROPIUM-ALBUTEROL 0.5-2.5 (3) MG/3ML IN SOLN
3.0000 mL | Freq: Once | RESPIRATORY_TRACT | Status: AC
  Administered 2017-11-14: 3 mL via RESPIRATORY_TRACT
  Filled 2017-11-14: qty 3

## 2017-11-14 MED ORDER — ENOXAPARIN SODIUM 40 MG/0.4ML ~~LOC~~ SOLN
40.0000 mg | SUBCUTANEOUS | Status: DC
  Administered 2017-11-14 – 2017-11-15 (×2): 40 mg via SUBCUTANEOUS
  Filled 2017-11-14 (×2): qty 0.4

## 2017-11-14 MED ORDER — SODIUM CHLORIDE 0.9 % IV SOLN
INTRAVENOUS | Status: AC
  Administered 2017-11-14 – 2017-11-15 (×2): via INTRAVENOUS

## 2017-11-14 MED ORDER — ALBUTEROL SULFATE (2.5 MG/3ML) 0.083% IN NEBU: 2.5000 mg | INHALATION_SOLUTION | RESPIRATORY_TRACT | Status: DC | PRN

## 2017-11-14 MED ORDER — SODIUM CHLORIDE 0.9 % IV SOLN
Freq: Once | INTRAVENOUS | Status: AC
  Administered 2017-11-14: 19:00:00 via INTRAVENOUS

## 2017-11-14 MED ORDER — PANTOPRAZOLE SODIUM 40 MG PO TBEC: 40.0000 mg | DELAYED_RELEASE_TABLET | Freq: Every day | ORAL | Status: DC | PRN

## 2017-11-14 MED ORDER — HYDROCODONE-ACETAMINOPHEN 5-325 MG PO TABS
1.0000 | ORAL_TABLET | Freq: Once | ORAL | Status: AC
  Administered 2017-11-14: 1 via ORAL
  Filled 2017-11-14: qty 1

## 2017-11-14 NOTE — ED Notes (Signed)
ED TO INPATIENT HANDOFF REPORT  Name/Age/Gender Erica [name] 67 y.o. female  Code Status Code Status History    Date Active Date Inactive Code Status Order ID Comments User Context   02/14/2017 1558 02/16/2017 1553 Full Code [id number]  Bonnielee Haff, MD Inpatient   08/16/2015 0241 08/16/2015 2132 Full Code [id number]  Edwin Dada, MD Inpatient      Home/SNF/Other Home  Chief Complaint shortness of breath  Level of Care/Admitting Diagnosis ED Disposition    ED Disposition Condition [facility] Area: [facility] [[id number]]  Level of Care: Med-Surg [16]  Diagnosis: COPD exacerbation [facility]) [[id number]]  Admitting Physician: Toy Baker [[id number]]  Attending Physician: Toy Baker [[id number]]  Estimated length of stay: 3 - 4 days  Certification:: I certify this patient will need inpatient services for at least 2 midnights  PT Class (Do Not Modify): Inpatient [101]  PT Acc Code (Do Not Modify): Private [1]       Medical History Past Medical History:  Diagnosis Date  . ADENOCARCINOMA, COLON, HX OF 03/2009  . ANEMIA   . BACK PAIN, LUMBAR   . Cancer [facility]) 2010   colon cancer  . CHF (congestive heart failure) ([place])   . COPD (chronic obstructive pulmonary disease) with emphysema ([place])   . Crohn's  02/2010   ileal ulcers, intol of entercort--refuses treatment  . Duodenitis 03/29/2017   Peptic  . GERD   . History of transfusion of whole blood   . HYPERLIPIDEMIA   . HYPERTENSION   . Microscopic hematuria    chronic  . Obstruction of intestine or colon (HCC)    adhesions  . OSTEOARTHRITIS   . Rectal fissure    Possible fissure  . Rotator cuff tear   . Takotsubo syndrome 12/2008  . URINARY INCONTINENCE     Allergies Allergies  Allergen Reactions  . Morphine Nausea And Vomiting    IV Location/Drains/Wounds Patient Lines/Drains/Airways Status   Active Line/Drains/Airways    Name:   Placement date:   Placement  time:   Site:   Days:   Peripheral IV 11/14/17 Left Antecubital   11/14/17    1715    Antecubital   less than 1   Peripheral IV 11/14/17 Left Forearm   11/14/17    1730    Forearm   less than 1          Labs/Imaging Results for orders placed or performed during the hospital encounter of 11/14/17 (from the past 48 hour(s))  Basic metabolic panel     Status: None   Collection Time: 11/14/17  5:15 PM  Result Value Ref Range   Sodium 141 135 - 145 mmol/L   Potassium 4.1 3.5 - 5.1 mmol/L   Chloride 103 98 - 111 mmol/L   CO2 28 22 - 32 mmol/L   Glucose, Bld 93 70 - 99 mg/dL   BUN 16 8 - 23 mg/dL   Creatinine, Ser 0.72 0.44 - 1.00 mg/dL   Calcium 9.0 8.9 - 10.3 mg/dL   GFR calc non Af Amer >60 >60 mL/min   GFR calc Af Amer >60 >60 mL/min    Comment: (NOTE) The eGFR has been calculated using the CKD EPI equation. This calculation has not been validated in all clinical situations. eGFR's persistently <60 mL/min signify possible Chronic Kidney Disease.    Anion gap 10 5 - 15    Comment: Performed at [facility], [place] [address], [place], [place] [address]  Brain natriuretic peptide     Status: None   Collection Time: 11/14/17  5:15 PM  Result Value Ref Range   B Natriuretic Peptide 18.1 0.0 - 100.0 pg/mL    Comment: Performed at [facility], [place] [address], [place], [place] [address]  CBC with Differential     Status: None   Collection Time: 11/14/17  5:15 PM  Result Value Ref Range   WBC 7.7 4.0 - 10.5 K/uL   RBC 4.30 3.87 - 5.11 MIL/uL   Hemoglobin 13.4 12.0 - 15.0 g/dL   HCT 40.7 36.0 - 46.0 %   MCV 94.7 78.0 - 100.0 fL   MCH 31.2 26.0 - 34.0 pg   MCHC 32.9 30.0 - 36.0 g/dL   RDW 13.8 11.5 - 15.5 %   Platelets 219 150 - 400 K/uL   Neutrophils Relative % 64 %   Neutro Abs 5.0 1.7 - 7.7 K/uL   Lymphocytes Relative 28 %   Lymphs Abs 2.1 0.7 - 4.0 K/uL   Monocytes Relative 7 %   Monocytes Absolute 0.5 0.1 - 1.0 K/uL   Eosinophils  Relative 1 %   Eosinophils Absolute 0.1 0.0 - 0.7 K/uL   Basophils Relative 0 %   Basophils Absolute 0.0 0.0 - 0.1 K/uL    Comment: Performed at [facility], [place] [address], [place], [place] [address]  D-dimer, quantitative     Status: None   Collection Time: 11/14/17  5:15 PM  Result Value Ref Range   D-Dimer, Quant 0.50 0.00 - 0.50 ug/mL-FEU    Comment: (NOTE) At the manufacturer cut-off of 0.50 ug/mL FEU, this assay has been documented to exclude PE with a sensitivity and negative predictive value of 97 to 99%.  At this time, this assay has not been approved by the FDA to exclude DVT/VTE. Results should be correlated with clinical presentation. Performed at [facility], [place] [address] [place], [place] [address]   I-stat troponin, ED     Status: None   Collection Time: 11/14/17  5:22 PM  Result Value Ref Range   Troponin i, poc 0.01 0.00 - 0.08 ng/mL   Comment 3            Comment: Due to the release kinetics of cTnI, a negative result within the first hours of the onset of symptoms does not rule out myocardial infarction with certainty. If myocardial infarction is still suspected, repeat the test at appropriate intervals.   I-Stat CG4 Lactic Acid, ED     Status: None   Collection Time: 11/14/17  5:24 PM  Result Value Ref Range   Lactic Acid, Venous 1.05 0.5 - 1.9 mmol/L   Dg Chest 2 View  Result Date: 11/14/2017 CLINICAL DATA:  Shortness of breath, cough EXAM: CHEST - 2 VIEW COMPARISON:  None. FINDINGS: The lungs are hyperinflated likely secondary to COPD. There is no focal parenchymal opacity. There is no pleural effusion or pneumothorax. The heart and mediastinal contours are unremarkable. The osseous structures are unremarkable. IMPRESSION: No active cardiopulmonary disease. Electronically Signed   By: Kathreen Devoid   On: 11/14/2017 16:51    Pending Labs Unresulted Labs (From admission, onward)    Start     Ordered   11/14/17  1655  Culture, blood (routine x 2)  BLOOD CULTURE X 2,   STAT     11/14/17 1654   Signed and Held  HIV antibody (Routine Testing)  Tomorrow morning,   R  Signed and Held   Signed and Held  Magnesium  Tomorrow morning,   R    Comments:  Call MD if <1.5    Signed and Held   Signed and Held  Phosphorus  Tomorrow morning,   R     Signed and Held   Signed and Held  TSH  Once,   R    Comments:  Cancel if already done within 1 month and notify MD    Signed and Held   Signed and Held  Comprehensive metabolic panel  Once,   R    Comments:  Cal MD for K<3.5 or >5.0    Signed and Held   Signed and Held  CBC  Once,   R    Comments:  Call for hg <8.0    Signed and Held   Signed and Held  Respiratory Panel by PCR  (Respiratory virus panel)  Once,   R     Signed and Held          Vitals/Pain Today's Vitals   11/14/17 1608 11/14/17 1609 11/14/17 1615 11/14/17 1839  BP:   131/78 129/76  Pulse:   92 90  Resp:   (!) 23 (!) 21  Temp: 99 F (37.2 C)     TempSrc: Oral     SpO2:   96% 93%  Weight:  57.2 kg    Height:  _0  (1.575 m)    PainSc: 5        Isolation Precautions No active isolations  Medications Medications  ipratropium-albuterol (DUONEB) 0.5-2.5 (3) MG/3ML nebulizer solution 3 mL (has no administration in time range)  ipratropium-albuterol (DUONEB) 0.5-2.5 (3) MG/3ML nebulizer solution 3 mL (3 mLs Nebulization Given 11/14/17 1733)  HYDROcodone-acetaminophen (NORCO/VICODIN) 5-325 MG per tablet 1 tablet (1 tablet Oral Given 11/14/17 1732)  methylPREDNISolone sodium succinate (SOLU-MEDROL) 125 mg/2 mL injection 125 mg (125 mg Intravenous Given 11/14/17 1733)  cefTRIAXone (ROCEPHIN) 1 g in sodium chloride 0.9 % 100 mL IVPB (0 g Intravenous Stopped 11/14/17 2046)  0.9 %  sodium chloride infusion ( Intravenous New Bag/Given 11/14/17 1920)    Mobility walks

## 2017-11-14 NOTE — Progress Notes (Signed)
Erica [name] is a 67 y.o. female with the following history as recorded in EpicCare:  Patient Active Problem List   Diagnosis Date Noted  . Chest pressure 10/31/2017  . Trigger point of right shoulder region 08/29/2017  . Encounter for chronic pain management 08/24/2017  . Narcotic dependence ([place]) 05/25/2017  . SBO (small bowel obstruction) ([place]) 02/14/2017  . Tobacco abuse 02/14/2017  . History of Crohn's disease 02/14/2017  . Lumbar radiculopathy, acute 12/13/2016  . 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 Chondrocalcinosis 08/25/2016  . Vitamin D deficiency 08/17/2016  . Rash 07/11/2016  . Positive anti-CCP test 06/05/2016  . Weight loss 05/05/2016  . Cervical disc disorder with radiculopathy of cervical region 06/08/2014  . Right shoulder pain 04/20/2014  . Subacromial bursitis 01/26/2014  . Complete rotator cuff tear of left shoulder 11/27/2013  . Primary localized osteoarthrosis, lower leg 11/27/2013  . COPD with exacerbation ([place])   . Microscopic hematuria   . Cigarette smoker 02/27/2011  . 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 History of malignant neoplasm of large intestine 02/18/2010  . Hyperlipidemia 02/16/2010  . GERD 02/16/2010  . Osteoarthritis 02/16/2010  . Essential hypertension 02/14/2010  . Takotsubo syndrome 02/12/2009    Current Outpatient Medications  Medication Sig Dispense Refill  . albuterol (PROVENTIL HFA;VENTOLIN HFA) 108 (90 Base) MCG/ACT inhaler Inhale 2 puffs daily and use as needed Q 4 hr for wheeze, SOB 1 Inhaler 11  . calcium carbonate (CALCIUM 600) 600 MG TABS tablet Take 600 mg daily by mouth.     . Diphenhydramine-PE-APAP (SEVERE COLD PO) Take 5 mLs every 6 (six) hours as needed by mouth (for cold).    . ferrous sulfate 325 (65 FE) MG tablet Take 325 mg daily by mouth.    [name] HYDROcodone-acetaminophen (NORCO/VICODIN) 5-325 MG tablet Take 1 tablet by mouth 2 (two) times  daily as needed for moderate pain (pain). 60 tablet 0  . magnesium oxide (MAG-OX) 400 (241.3 Mg) MG tablet Take 1 tablet (400 mg total) daily by mouth. 30 tablet 0  . meloxicam (MOBIC) 15 MG tablet Take 1 tablet (15 mg total) by mouth daily. 30 tablet 6  . metoprolol tartrate (LOPRESSOR) 25 MG tablet Take 1 tablet (25 mg total) by mouth 2 (two) times daily. 180 tablet 3  . pantoprazole (PROTONIX) 40 MG tablet Take 1 tablet by mouth every morning. 30 tablet 6  . Probiotic Product (PROBIOTIC DAILY) CAPS Take 1 capsule daily by mouth.    . vitamin B-12 (CYANOCOBALAMIN) 1000 MCG tablet Take 1,000 mcg by mouth daily.    . Vitamin D, Ergocalciferol, (DRISDOL) 50000 units CAPS capsule TAKE 1 CAPSULE BY MOUTH EVERY 7 DAYS 12 capsule 0   No current facility-administered medications for this visit.     Allergies: Morphine  Past Medical History:  Diagnosis Date  . ADENOCARCINOMA, COLON, HX OF 03/2009  . ANEMIA   . BACK PAIN, LUMBAR   . Cancer [facility]) 2010   colon cancer  . CHF (congestive heart failure) ([place])   . COPD (chronic obstructive pulmonary disease) with emphysema ([place])   . Crohn's  02/2010   ileal ulcers, intol of entercort--refuses treatment  . Duodenitis 03/29/2017   Peptic  . GERD   . History of transfusion of whole blood   . HYPERLIPIDEMIA   . HYPERTENSION   . Microscopic hematuria    chronic  . Obstruction of intestine or colon (HCC)    adhesions  .  OSTEOARTHRITIS   . Rectal fissure    Possible fissure  . Rotator cuff tear   . Takotsubo syndrome 12/2008  . URINARY INCONTINENCE     Past Surgical History:  Procedure Laterality Date  . ABDOMINAL HYSTERECTOMY  1994  . APPENDECTOMY  1964  . BLADDER SUSPENSION    . CESAREAN SECTION     x 3  . CHOLECYSTECTOMY  1995  . COLON SURGERY  03/2009   hemicolectomy colon cancer  . COLONOSCOPY W/ BIOPSIES AND POLYPECTOMY  01/24/2011   (Crohn's)ileitis, internal hemorrhoids  . ECTOPIC PREGNANCY SURGERY    .  ESOPHAGOGASTRODUODENOSCOPY    . FACIAL COSMETIC SURGERY    . HAND SURGERY Bilateral 1991  . KNEE SURGERY Right 1981  . right knee replacement  03/2010  . supraorbital fracture surgery    . TONSILLECTOMY  1952  . UPPER GASTROINTESTINAL ENDOSCOPY  05/16/2010   normal  . UTERINE SUSPENSION    . VIDEO BRONCHOSCOPY Bilateral 12/18/2014   Procedure: VIDEO BRONCHOSCOPY WITHOUT FLUORO;  Surgeon: Tanda Rockers, MD;  Location: [facility];  Service: Cardiopulmonary;  Laterality: Bilateral;    Family History  Problem Relation Age of Onset  . Colon cancer Father 24  . Hypertension Father   . Heart disease Father   . Kidney disease Father   . Colon cancer Sister 4  . Liver cancer Sister   . Other Sister        amyloidosis  . Throat cancer Mother   . Arthritis Other        Parent, other relative    Social History   Tobacco Use  . Smoking status: Current Every Day Smoker    Packs/day: 1.00    Years: 50.00    Pack years: 50.00    Types: Cigarettes  . Smokeless tobacco: Never Used  Substance Use Topics  . Alcohol use: No    Alcohol/week: 0.0 standard drinks    Subjective:  Patient presents with concerns for acute bronchitis/ COPD flare; feels like she is struggling to breathe/ painful to breathe- no benefit with her albuterol inhaler; + productive cough; symptoms have rapidly worsened in the past 2 days; + smoker;   Objective:  Vitals:   11/14/17 1419  BP: 120/70  Pulse: (!) 109  Temp: 98.7 F (37.1 C)  TempSrc: Oral  SpO2: 94%  Weight: 127 lb 1.9 oz (57.7 kg)  Height: 5' (1.524 m)    General: Well developed, well nourished, in mild distress- pale, weak appearing  Skin : Warm and dry.  Head: Normocephalic and atraumatic  Eyes: Sclera and conjunctiva clear; pupils round and reactive to light; extraocular movements intact  Ears: External normal; canals clear; tympanic membranes normal  Oropharynx: Pink, supple. No suspicious lesions  Neck: Supple without thyromegaly,  adenopathy  Lungs: Diminished breath sounds on exam- Duo-neb given in office with limited benefit; placed on 1L of oxygen and patient's color improved/ expressed easier time breathing CVS exam: normal rate and regular rhythm.  Neurologic: Alert and oriented; speech intact; face symmetrical; moves all extremities well; CNII-XII intact without focal deficit  Assessment:  1. COPD with exacerbation ([place])   2. Trouble breathing   3. Cigarette smoker     Plan:  Duo-neb given in office with little relief; placed on oxygen; based on appearance/ exam, am concerned for pneumonia; sent to ER by ambulance to allow patient to remain on oxygen.  Follow-up as needed after discharged from hospital.   No follow-ups on file.  No orders of  the defined types were placed in this encounter.   Requested Prescriptions    No prescriptions requested or ordered in this encounter

## 2017-11-14 NOTE — ED Notes (Signed)
Bed: WA06 Expected date:  Expected time:  Means of arrival:  Comments: EMS, [facility]

## 2017-11-14 NOTE — ED Notes (Signed)
URINE SAMPLE AT BEDSIDE

## 2017-11-14 NOTE — ED Provider Notes (Signed)
[facility] DEPT Provider Note   CSN: [id number] Arrival date & time: 11/14/17  1537     History   Chief Complaint Chief Complaint  Patient presents with  . Shortness of Breath    HPI Erica [name] Date is a 67 y.o. female with a history of adenocarcinoma of the colon status post resection currently in remission, CHF (last echocardiogram on 10/17/2013 with a EF of 60 to 65%, takotsubo cardiomyopathy and G1DD), hypertension, hyperlipidemia, COPD, prior MI, tobacco abuse who presents emergency department today for shortness of breath, chest pain and cough.  Patient reports that approximately 2 weeks ago she started developing URI symptoms including nasal congestion, rhinorrhea, cough with mild shortness of breath.  She notes she tried [place] and over the counter medication for symptoms without relief.  She reports over the last 2 days she has become severely short of breath, developed pleuritic-like chest pain centrally, with worsening cough with green/yellow sputum. Her sob is worse with exertion. No orthopnea. She did reports that she had a home temperature of 100 F 2 days ago.  She has had some chills but the fever has resolved.  She reports that she has tried her home albuterol inhaler without any relief.  She saw her PCP today who gave her a DuoNeb in the office without any relief.  She was noted to be hypoxic and was placed on 3 L O2 with improvement.  She notes she does not wear oxygen at home.  The patient denies any sinus pressure, headache, neck stiffness, rash, exertional chest pain, nausea/vomiting/diarrhea, abdominal pain, urinary symptoms, hemoptysis, lower leg swelling, recent travel/surgery, prior history of blood clot, immobilization or estrogen replacement therapy.  HPI  Past Medical History:  Diagnosis Date  . ADENOCARCINOMA, COLON, HX OF 03/2009  . ANEMIA   . BACK PAIN, LUMBAR   . Cancer [facility]) 2010   colon cancer  . CHF (congestive heart  failure) ([place])   . COPD (chronic obstructive pulmonary disease) with emphysema ([place])   . Crohn's  02/2010   ileal ulcers, intol of entercort--refuses treatment  . Duodenitis 03/29/2017   Peptic  . GERD   . History of transfusion of whole blood   . HYPERLIPIDEMIA   . HYPERTENSION   . Microscopic hematuria    chronic  . Obstruction of intestine or colon (HCC)    adhesions  . OSTEOARTHRITIS   . Rectal fissure    Possible fissure  . Rotator cuff tear   . Takotsubo syndrome 12/2008  . URINARY INCONTINENCE     Patient Active Problem List   Diagnosis Date Noted  . Chest pressure 10/31/2017  . Trigger point of right shoulder region 08/29/2017  . Encounter for chronic pain management 08/24/2017  . Narcotic dependence ([place]) 05/25/2017  . SBO (small bowel obstruction) ([place]) 02/14/2017  . Tobacco abuse 02/14/2017  . History of Crohn's disease 02/14/2017  . Lumbar radiculopathy, acute 12/13/2016  . 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 Chondrocalcinosis 08/25/2016  . Vitamin D deficiency 08/17/2016  . Rash 07/11/2016  . Positive anti-CCP test 06/05/2016  . Weight loss 05/05/2016  . Cervical disc disorder with radiculopathy of cervical region 06/08/2014  . Right shoulder pain 04/20/2014  . Subacromial bursitis 01/26/2014  . Complete rotator cuff tear of left shoulder 11/27/2013  . Primary localized osteoarthrosis, lower leg 11/27/2013  . COPD with exacerbation ([place])   . Microscopic hematuria   . Cigarette smoker 02/27/2011  . 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 History of malignant neoplasm of large intestine 02/18/2010  . Hyperlipidemia 02/16/2010  . GERD 02/16/2010  . Osteoarthritis 02/16/2010  . Essential hypertension 02/14/2010  . Takotsubo syndrome 02/12/2009    Past Surgical History:  Procedure Laterality Date  . ABDOMINAL HYSTERECTOMY  1994  . APPENDECTOMY  1964  . BLADDER SUSPENSION    .  CESAREAN SECTION     x 3  . CHOLECYSTECTOMY  1995  . COLON SURGERY  03/2009   hemicolectomy colon cancer  . COLONOSCOPY W/ BIOPSIES AND POLYPECTOMY  01/24/2011   (Crohn's)ileitis, internal hemorrhoids  . ECTOPIC PREGNANCY SURGERY    . ESOPHAGOGASTRODUODENOSCOPY    . FACIAL COSMETIC SURGERY    . HAND SURGERY Bilateral 1991  . KNEE SURGERY Right 1981  . right knee replacement  03/2010  . supraorbital fracture surgery    . TONSILLECTOMY  1952  . UPPER GASTROINTESTINAL ENDOSCOPY  05/16/2010   normal  . UTERINE SUSPENSION    . VIDEO BRONCHOSCOPY Bilateral 12/18/2014   Procedure: VIDEO BRONCHOSCOPY WITHOUT FLUORO;  Surgeon: Tanda Rockers, MD;  Location: [facility] ENDOSCOPY;  Service: Cardiopulmonary;  Laterality: Bilateral;     OB History   None      Home Medications    Prior to Admission medications   Medication Sig Start Date End Date Taking? Authorizing Provider  albuterol (PROVENTIL HFA;VENTOLIN HFA) 108 (90 Base) MCG/ACT inhaler Inhale 2 puffs daily and use as needed Q 4 hr for wheeze, SOB 11/03/16   Burns, Claudina Lick, MD  calcium carbonate (CALCIUM 600) 600 MG TABS tablet Take 600 mg daily by mouth.     [provider]  Diphenhydramine-PE-APAP (SEVERE COLD PO) Take 5 mLs every 6 (six) hours as needed by mouth (for cold).    [provider]  ferrous sulfate 325 (65 FE) MG tablet Take 325 mg daily by mouth.    [provider]  HYDROcodone-acetaminophen (NORCO/VICODIN) 5-325 MG tablet Take 1 tablet by mouth 2 (two) times daily as needed for moderate pain (pain). 11/12/17   [name], MD  magnesium oxide (MAG-OX) 400 (241.3 Mg) MG tablet Take 1 tablet (400 mg total) daily by mouth. 02/16/17   Florencia Reasons, MD  meloxicam (MOBIC) 15 MG tablet Take 1 tablet (15 mg total) by mouth daily. 08/22/17   [name], MD  metoprolol tartrate (LOPRESSOR) 25 MG tablet Take 1 tablet (25 mg total) by mouth 2 (two) times daily. 01/12/17   Belva Crome, MD    pantoprazole (PROTONIX) 40 MG tablet Take 1 tablet by mouth every morning. 03/01/17   Esterwood, Amy S, PA-C  Probiotic Product (PROBIOTIC DAILY) CAPS Take 1 capsule daily by mouth.    [provider]  vitamin B-12 (CYANOCOBALAMIN) 1000 MCG tablet Take 1,000 mcg by mouth daily.    [provider]  Vitamin D, Ergocalciferol, (DRISDOL) 50000 units CAPS capsule TAKE 1 CAPSULE BY MOUTH EVERY 7 DAYS 02/19/17   Lyndal Pulley, DO    Family History Family History  Problem Relation Age of Onset  . Colon cancer Father 92  . Hypertension Father   . Heart disease Father   . Kidney disease Father   . Colon cancer Sister 41  . Liver cancer Sister   . Other Sister        amyloidosis  . Throat cancer Mother   . Arthritis Other        Parent, other relative    Social History Social  History   Tobacco Use  . Smoking status: Current Every Day Smoker    Packs/day: 1.00    Years: 50.00    Pack years: 50.00    Types: Cigarettes  . Smokeless tobacco: Never Used  Substance Use Topics  . Alcohol use: No    Alcohol/week: 0.0 standard drinks  . Drug use: No     Allergies   Morphine   Review of Systems Review of Systems  All other systems reviewed and are negative.    Physical Exam Updated Vital Signs BP 140/80 (BP Location: Right Arm)   Pulse 85   Temp 98.3 F (36.8 C)   Resp 19   Ht 5' 2"  (1.575 m)   Wt 57.2 kg   SpO2 94%   BMI 23.05 kg/m   Physical Exam  Constitutional: She appears well-developed.  Cachectic appearing but nontoxic  HENT:  Head: Normocephalic and atraumatic.  Right Ear: External ear normal.  Left Ear: External ear normal.  Nose: Nose normal.  Mouth/Throat: Uvula is midline, oropharynx is clear and moist and mucous membranes are normal. No tonsillar exudate.  Eyes: Pupils are equal, round, and reactive to light. Right eye exhibits no discharge. Left eye exhibits no discharge. No scleral icterus.  Neck: Trachea normal. Neck supple.  JVD present. No spinous process tenderness present. Carotid bruit is not present. No neck rigidity. Normal range of motion present.  No nuchal rigidity or meningismus  Cardiovascular: Normal rate, regular rhythm and intact distal pulses.  No murmur heard. Pulses:      Radial pulses are 2+ on the right side, and 2+ on the left side.       Dorsalis pedis pulses are 2+ on the right side, and 2+ on the left side.       Posterior tibial pulses are 2+ on the right side, and 2+ on the left side.  No lower extremity swelling or edema. Calves symmetric in size bilaterally.  Pulmonary/Chest: Tachypnea noted. She has decreased breath sounds in the right lower field. She has wheezes in the right upper field, the right middle field, the left upper field and the left lower field. She exhibits no tenderness.  Patient on 3L of O2, sating at 94% on room air.  Abdominal: Soft. Bowel sounds are normal. There is no tenderness. There is no rebound and no guarding.  Musculoskeletal: She exhibits no edema.  Lymphadenopathy:    She has no cervical adenopathy.  Neurological: She is alert.  Speech clear. Follows commands. No facial droop. PERRLA. EOM grossly intact. CN III-XII grossly intact. Grossly moves all extremities 4 without ataxia. Able and appropriate strength for age to upper and lower extremities bilaterally  Skin: Skin is warm and dry. No rash noted. She is not diaphoretic.  Psychiatric: She has a normal mood and affect.  Nursing note and vitals reviewed.    ED Treatments / Results  Labs (all labs ordered are listed, but only abnormal results are displayed) Labs Reviewed  CULTURE, BLOOD (ROUTINE X 2)  CULTURE, BLOOD (ROUTINE X 2)  RESPIRATORY PANEL BY PCR  BASIC METABOLIC PANEL  BRAIN NATRIURETIC PEPTIDE  CBC WITH DIFFERENTIAL/PLATELET  D-DIMER, QUANTITATIVE (NOT AT [facility])  HIV ANTIBODY (ROUTINE TESTING)  MAGNESIUM  PHOSPHORUS  TSH  COMPREHENSIVE METABOLIC PANEL  CBC  I-STAT TROPONIN, ED   I-STAT CG4 LACTIC ACID, ED  I-STAT CG4 LACTIC ACID, ED    EKG None  Radiology Dg Chest 2 View  Result Date: 11/14/2017 CLINICAL DATA:  Shortness of breath,  cough EXAM: CHEST - 2 VIEW COMPARISON:  None. FINDINGS: The lungs are hyperinflated likely secondary to COPD. There is no focal parenchymal opacity. There is no pleural effusion or pneumothorax. The heart and mediastinal contours are unremarkable. The osseous structures are unremarkable. IMPRESSION: No active cardiopulmonary disease. Electronically Signed   By: Kathreen Devoid   On: 11/14/2017 16:51    Procedures Procedures (including critical care time) CRITICAL CARE Performed by: Jillyn Ledger   Total critical care time: 45 minutes - copd excerebration with hypoxia requiring multiple breathing treatments and rechecks.  Critical care time was exclusive of separately billable procedures and treating other patients.  Critical care was necessary to treat or prevent imminent or life-threatening deterioration.  Critical care was time spent personally by me on the following activities: development of treatment plan with patient and/or surrogate as well as nursing, discussions with consultants, evaluation of patient's response to treatment, examination of patient, obtaining history from patient or surrogate, ordering and performing treatments and interventions, ordering and review of laboratory studies, ordering and review of radiographic studies, pulse oximetry and re-evaluation of patient's condition.   Medications Ordered in ED Medications  ipratropium-albuterol (DUONEB) 0.5-2.5 (3) MG/3ML nebulizer solution 3 mL (has no administration in time range)  HYDROcodone-acetaminophen (NORCO/VICODIN) 5-325 MG per tablet 1 tablet (has no administration in time range)  metoprolol tartrate (LOPRESSOR) tablet 25 mg (25 mg Oral Given 11/14/17 2205)  pantoprazole (PROTONIX) EC tablet 40 mg (has no administration in time range)  acetaminophen  (TYLENOL) tablet 650 mg (has no administration in time range)    Or  acetaminophen (TYLENOL) suppository 650 mg (has no administration in time range)  ondansetron (ZOFRAN) tablet 4 mg (has no administration in time range)    Or  ondansetron (ZOFRAN) injection 4 mg (has no administration in time range)  doxycycline (VIBRA-TABS) tablet 100 mg (100 mg Oral Given 11/14/17 2204)  albuterol (PROVENTIL) (2.5 MG/3ML) 0.083% nebulizer solution 2.5 mg (has no administration in time range)  enoxaparin (LOVENOX) injection 40 mg (40 mg Subcutaneous Given 11/14/17 2204)  predniSONE (DELTASONE) tablet 40 mg (has no administration in time range)  0.9 %  sodium chloride infusion ( Intravenous New Bag/Given 11/14/17 2204)  ipratropium-albuterol (DUONEB) 0.5-2.5 (3) MG/3ML nebulizer solution 3 mL (has no administration in time range)  ipratropium-albuterol (DUONEB) 0.5-2.5 (3) MG/3ML nebulizer solution 3 mL (3 mLs Nebulization Given 11/14/17 1733)  HYDROcodone-acetaminophen (NORCO/VICODIN) 5-325 MG per tablet 1 tablet (1 tablet Oral Given 11/14/17 1732)  methylPREDNISolone sodium succinate (SOLU-MEDROL) 125 mg/2 mL injection 125 mg (125 mg Intravenous Given 11/14/17 1733)  cefTRIAXone (ROCEPHIN) 1 g in sodium chloride 0.9 % 100 mL IVPB (0 g Intravenous Stopped 11/14/17 2046)  0.9 %  sodium chloride infusion ( Intravenous New Bag/Given 11/14/17 1920)     Initial Impression / Assessment and Plan / ED Course  I have reviewed the triage vital signs and the nursing notes.  Pertinent labs & imaging results that were available during my care of the patient were reviewed by me and considered in my medical decision making (see chart for details).     67 y.o. female presenting with 2-week history of URI symptoms that worsened in last 2 days with shortness of breath that is worse with exertion, productive cough, subjective fever and chills.  She was seen by PCP today noted to be hypoxic.  This improved with O2 by nasal  cannula.  She does not wear oxygen at home.  She is noted to be mildly tachycardic on  presentation.  No fever, tachypnea or hypotension.  Patient is mildly increased work of breathing she is protecting her airway.  She does have allergic chest pain.  She denies any lower leg swelling, hemoptysis associated with this.  Exam as above.  Patient improved after 2 DuoNeb therapies.  She is also given IV Solu-Medrol in the department.  She is resting comfortably denies any shortness of breath.  She is still requiring 3 L of O2 to remain without hypoxia.  Her tachycardia has resolved.  Blood work, EKG and chest x-ray are reassuring.  She is without leukocytosis.  Chest x-ray without evidence of infiltrates or edema.  BNP within normal limits.  Lactic acid within normal limits.  No evidence of sepsis.  D-dimer within normal limits.  No indication for further work-up for PE.  Patient started on IV antibiotics given change in cough with COPD exacerbation.  She is resting comfortably and appears safe for admission.  I appreciate Dr. Roel Cluck for bring the patient into the hospitalist service. Patient case seen and discussed with Dr. Roderic Palau who is in agreement with plan.   Final Clinical Impressions(s) / ED Diagnoses   Final diagnoses:  COPD exacerbation ([place])  Acute respiratory failure with hypoxia [facility])    ED Discharge Orders    None       Lorelle Gibbs 11/14/17 2320    Milton Ferguson, MD 11/15/17 1227

## 2017-11-14 NOTE — ED Triage Notes (Signed)
BIB [facility] from PCP. Pt went in to be seen for SOB. Hx COPD, MI, colon CA. Pt experiencing chest wall pain with inspiration. Cough present, productive with green thick mucus. Sent from PCP for sats in low 80s. Put on O2 at office and they stated that she would not go above 94%. Currently on 3L. Does not wear O2 at home.

## 2017-11-14 NOTE — H&P (Signed)
Erica [name] JAS:[id number] DOB: 18-Sep-1950 DOA: 11/14/2017     PCP: [name], MD   Outpatient Specialists:   CARDS:  Dr. Linard Millers  [facility]    Dr. Melvyn Novas   Patient arrived to ER on 11/14/17 at 1537  Patient coming from:  home Lives   With family    Chief Complaint:  Chief Complaint  Patient presents with  . Shortness of Breath    HPI: Erica [name] is a 67 y.o. female with medical history significant of  COPD, clon Ca sp resection, hx of  SBO, TakoTsubo syndrome, GERD, HTN grade 1 diastolic dysfunction, tobacco abuse, Chron's     Presented with 2 weeks of SOB, now got worse associated with pleuritic pain, cough productive of thick yellow sputum low grade fever.  Dyspnea on exertion.  Few days ago at home temperature up to 100.0 associated chills she has tried to use albuterol at home but did not seem to help otherwise no headache no rash no nausea vomiting or diarrhea no abdominal pain today presented to PCP got Duoneb in the office but not better. Recently her Son and Daughter in law had a cold recently.   Oxygen saturation low 80s started on oxygen and went up to 94% on 3 L and was sent to emergency department by ambulance patient not on oxygen at baseline she continues to smoke No leg swelling no recent travel  Regarding pertinent Chronic problems: History of colon cancer currently in remission History of Takotsubo cardiomyopathy initially EF 45% repeat echogram in July 2015 showing grade 1 diastolic dysfunction and EF 60 to 65% Hx of Chron's with occasional diarrhea.   While in ER: Treated with DuoNeb started on ceftriaxone and given a dose of Solu-Medrol 125 with some improvement The following Work up has been ordered so far:  Orders Placed This Encounter  Procedures  . Culture, blood (routine x 2)  . DG Chest 2 View  . Basic metabolic panel  . Brain natriuretic peptide  . CBC with Differential  . Cardiac monitoring  . Repeat Vital  Signs  . Consult to hospitalist  . Pulse oximetry (single)  . I-stat troponin, ED  . I-Stat CG4 Lactic Acid, ED  . ED EKG  . EKG 12-Lead  . Insert peripheral IV  . Saline lock IV      Following Medications were ordered in ER: Medications  ipratropium-albuterol (DUONEB) 0.5-2.5 (3) MG/3ML nebulizer solution 3 mL (has no administration in time range)  cefTRIAXone (ROCEPHIN) 1 g in sodium chloride 0.9 % 100 mL IVPB (1 g Intravenous New Bag/Given 11/14/17 1915)  ipratropium-albuterol (DUONEB) 0.5-2.5 (3) MG/3ML nebulizer solution 3 mL (3 mLs Nebulization Given 11/14/17 1733)  HYDROcodone-acetaminophen (NORCO/VICODIN) 5-325 MG per tablet 1 tablet (1 tablet Oral Given 11/14/17 1732)  methylPREDNISolone sodium succinate (SOLU-MEDROL) 125 mg/2 mL injection 125 mg (125 mg Intravenous Given 11/14/17 1733)  0.9 %  sodium chloride infusion ( Intravenous New Bag/Given 11/14/17 1920)    Significant initial  Findings: Abnormal Labs Reviewed - No abnormal labs to display  Troponin 0.01 Na 141 K 4.1  Cr   stable,   Lab Results  Component Value Date   CREATININE 0.72 11/14/2017   CREATININE 0.73 05/24/2017   CREATININE 0.69 02/16/2017      WBC 7.7  HG/HCT   stable,       Component Value Date/Time   HGB 13.4 11/14/2017 1715   HCT 40.7 11/14/2017 1715  Troponin (Point of Care Test) Recent Labs    11/14/17 1722  TROPIPOC 0.01       BNP (last 3 results) Recent Labs    11/14/17 1715  BNP 18.1    ProBNP (last 3 results) No results for input(s): PROBNP in the last 8760 hours.  Lactic Acid, Venous    Component Value Date/Time   LATICACIDVEN 1.05 11/14/2017 1724      UA  not ordered     CXR -  NON acute     ECG:  Personally reviewed by me showing: HR : 90 Rhythm:  NSR,     no evidence of ischemic changes QTC 400     ED Triage Vitals  Enc Vitals Group     BP 11/14/17 1615 131/78     Pulse Rate 11/14/17 1615 92     Resp 11/14/17 1615 (!) 23      Temp 11/14/17 1608 99 F (37.2 C)     Temp Source 11/14/17 1607 Oral     SpO2 11/14/17 1546 94 %     Weight 11/14/17 1609 126 lb (57.2 kg)     Height 11/14/17 1609 5' 2"  (1.575 m)     Head Circumference --      Peak Flow --      Pain Score 11/14/17 1608 5     Pain Loc --      Pain Edu? --      Excl. in [place]? --   TMAX(24)@       Latest  Blood pressure 129/76, pulse 90, temperature 99 F (37.2 C), temperature source Oral, resp. rate (!) 21, height 5' 2"  (1.575 m), weight 57.2 kg, SpO2 93 %.    Hospitalist was called for admission for COPD exacerbation   Review of Systems:    Pertinent positives include: Fevers, chills, fatigue chest pain, nasal congestion,   shortness of breath at rest.   dyspnea on exertion,   excess mucus, productive cough,  Wheezing. change in color of mucus Constitutional:  No weight loss, night sweats,  weight loss  HEENT:  No headaches, Difficulty swallowing,Tooth/dental problems,Sore throat,  No sneezing, itching, ear ache, , post nasal drip,  Cardio-vascular:  No , Orthopnea, PND, anasarca, dizziness, palpitations.no Bilateral lower extremity swelling  GI:  No heartburn, indigestion, abdominal pain, nausea, vomiting, diarrhea, change in bowel habits, loss of appetite, melena, blood in stool, hematemesis Resp:    No non-productive cough, No coughing up of blood.   Skin:  no rash or lesions. No jaundice GU:  no dysuria, change in color of urine, no urgency or frequency. No straining to urinate.  No flank pain.  Musculoskeletal:  No joint pain or no joint swelling. No decreased range of motion. No back pain.  Psych:  No change in mood or affect. No depression or anxiety. No memory loss.  Neuro: no localizing neurological complaints, no tingling, no weakness, no double vision, no gait abnormality, no slurred speech, no confusion  All systems reviewed and apart from [place] all are negative  Past Medical History:   Past Medical History:  Diagnosis  Date  . ADENOCARCINOMA, COLON, HX OF 03/2009  . ANEMIA   . BACK PAIN, LUMBAR   . Cancer [facility]) 2010   colon cancer  . CHF (congestive heart failure) ([place])   . COPD (chronic obstructive pulmonary disease) with emphysema ([place])   . Crohn's  02/2010   ileal ulcers, intol of entercort--refuses treatment  . Duodenitis 03/29/2017   Peptic  .  GERD   . History of transfusion of whole blood   . HYPERLIPIDEMIA   . HYPERTENSION   . Microscopic hematuria    chronic  . Obstruction of intestine or colon (HCC)    adhesions  . OSTEOARTHRITIS   . Rectal fissure    Possible fissure  . Rotator cuff tear   . Takotsubo syndrome 12/2008  . URINARY INCONTINENCE       Past Surgical History:  Procedure Laterality Date  . ABDOMINAL HYSTERECTOMY  1994  . APPENDECTOMY  1964  . BLADDER SUSPENSION    . CESAREAN SECTION     x 3  . CHOLECYSTECTOMY  1995  . COLON SURGERY  03/2009   hemicolectomy colon cancer  . COLONOSCOPY W/ BIOPSIES AND POLYPECTOMY  01/24/2011   (Crohn's)ileitis, internal hemorrhoids  . ECTOPIC PREGNANCY SURGERY    . ESOPHAGOGASTRODUODENOSCOPY    . FACIAL COSMETIC SURGERY    . HAND SURGERY Bilateral 1991  . KNEE SURGERY Right 1981  . right knee replacement  03/2010  . supraorbital fracture surgery    . TONSILLECTOMY  1952  . UPPER GASTROINTESTINAL ENDOSCOPY  05/16/2010   normal  . UTERINE SUSPENSION    . VIDEO BRONCHOSCOPY Bilateral 12/18/2014   Procedure: VIDEO BRONCHOSCOPY WITHOUT FLUORO;  Surgeon: Tanda Rockers, MD;  Location: [facility];  Service: Cardiopulmonary;  Laterality: Bilateral;    Social History:  Ambulatory   Independently     reports that she has been smoking cigarettes. She has a 50.00 pack-year smoking history. She has never used smokeless tobacco. She reports that she does not drink alcohol or use drugs.     Family History:   Family History  Problem Relation Age of Onset  . Colon cancer Father 58  . Hypertension Father   . Heart disease Father    . Kidney disease Father   . Colon cancer Sister 93  . Liver cancer Sister   . Other Sister        amyloidosis  . Throat cancer Mother   . Arthritis Other        Parent, other relative    Allergies: Allergies  Allergen Reactions  . Morphine Nausea And Vomiting     Prior to Admission medications   Medication Sig Start Date End Date Taking? Authorizing Provider  acetaminophen (TYLENOL) 500 MG tablet Take 1,000 mg by mouth every 6 (six) hours as needed for mild pain, moderate pain or headache.   Yes [provider]  albuterol (PROVENTIL HFA;VENTOLIN HFA) 108 (90 Base) MCG/ACT inhaler Inhale 2 puffs daily and use as needed Q 4 hr for wheeze, SOB Patient taking differently: Inhale 1-2 puffs into the lungs every 6 (six) hours as needed for wheezing or shortness of breath.  11/03/16  Yes Burns, Claudina Lick, MD  HYDROcodone-acetaminophen (NORCO/VICODIN) 5-325 MG tablet Take 1 tablet by mouth 2 (two) times daily as needed for moderate pain (pain). 11/12/17  Yes [name], MD  metoprolol tartrate (LOPRESSOR) 25 MG tablet Take 1 tablet (25 mg total) by mouth 2 (two) times daily. 01/12/17  Yes Belva Crome, MD  pantoprazole (PROTONIX) 40 MG tablet Take 1 tablet by mouth every morning. Patient taking differently: Take 40 mg by mouth daily as needed (reflux).  03/01/17  Yes Esterwood, Amy S, PA-C  magnesium oxide (MAG-OX) 400 (241.3 Mg) MG tablet Take 1 tablet (400 mg total) daily by mouth. Patient not taking: Reported on 11/14/2017 02/16/17   Florencia Reasons, MD  meloxicam (MOBIC) 15 MG tablet  Take 1 tablet (15 mg total) by mouth daily. Patient not taking: Reported on 11/14/2017 08/22/17   [name], MD  Vitamin D, Ergocalciferol, (DRISDOL) 50000 units CAPS capsule TAKE 1 CAPSULE BY MOUTH EVERY 7 DAYS Patient not taking: Reported on 11/14/2017 02/19/17   Lyndal Pulley, DO   Physical Exam: Blood pressure 129/76, pulse 90, temperature 99 F (37.2 C), temperature source Oral,  resp. rate (!) 21, height 5' 2"  (1.575 m), weight 57.2 kg, SpO2 93 %. 1. General:  in No Acute distress   Chronically ill  -appearing 2. Psychological: Alert and   Oriented 3. Head/ENT:   Moist     Mucous Membranes                          Head Non traumatic, neck supple                           Poor Dentition 4. SKIN:   decreased Skin turgor,  Skin clean Dry and intact no rash 5. Heart: Regular rate and rhythm no  Murmur, no Rub or gallop 6. Lungs: some  wheezes no  crackles   7. Abdomen: Soft,  non-tender, Non distended  bowel sounds present 8. Lower extremities: no clubbing, cyanosis, or  edema 9. Neurologically Grossly intact, moving all 4 extremities equally  10. MSK: Normal range of motion   LABS:     Recent Labs  Lab 11/14/17 1715  WBC 7.7  NEUTROABS 5.0  HGB 13.4  HCT 40.7  MCV 94.7  PLT 440   Basic Metabolic Panel: Recent Labs  Lab 11/14/17 1715  NA 141  K 4.1  CL 103  CO2 28  GLUCOSE 93  BUN 16  CREATININE 0.72  CALCIUM 9.0      No results for input(s): AST, ALT, ALKPHOS, BILITOT, PROT, ALBUMIN in the last 168 hours. No results for input(s): LIPASE, AMYLASE in the last 168 hours. No results for input(s): AMMONIA in the last 168 hours.    HbA1C: No results for input(s): HGBA1C in the last 72 hours. CBG: No results for input(s): GLUCAP in the last 168 hours.    Urine analysis:    Component Value Date/Time   COLORURINE YELLOW 02/14/2017 Eva 02/14/2017 0947   LABSPEC 1.009 02/14/2017 0947   PHURINE 5.0 02/14/2017 0947   GLUCOSEU NEGATIVE 02/14/2017 0947   GLUCOSEU NEGATIVE 07/18/2013 1009   HGBUR MODERATE (A) 02/14/2017 0947   BILIRUBINUR NEGATIVE 02/14/2017 0947   BILIRUBINUR negative 04/17/2014 1016   KETONESUR NEGATIVE 02/14/2017 0947   PROTEINUR NEGATIVE 02/14/2017 0947   UROBILINOGEN 0.2 04/17/2014 1016   UROBILINOGEN 0.2 07/18/2013 1009   NITRITE NEGATIVE 02/14/2017 0947   LEUKOCYTESUR NEGATIVE 02/14/2017 0947         Cultures:    Component Value Date/Time   SDES URINE, CLEAN CATCH 05/06/2010 1832   SPECREQUEST N/V, abd. pain none IMMUNE:NORM UT SYMPT:NEG 05/06/2010 1832   CULT NO GROWTH 05/06/2010 1832   REPTSTATUS 05/08/2010 FINAL 05/06/2010 1832     Radiological Exams on Admission: Dg Chest 2 View  Result Date: 11/14/2017 CLINICAL DATA:  Shortness of breath, cough EXAM: CHEST - 2 VIEW COMPARISON:  None. FINDINGS: The lungs are hyperinflated likely secondary to COPD. There is no focal parenchymal opacity. There is no pleural effusion or pneumothorax. The heart and mediastinal contours are unremarkable. The osseous structures are unremarkable. IMPRESSION: No active cardiopulmonary  disease. Electronically Signed   By: Kathreen Devoid   On: 11/14/2017 16:51    Chart has been reviewed    Assessment/Plan  67 y.o. female with medical history significant of  COPD, clon Ca sp resection, hx of  SBO, TakoTsubo syndrome, GERD, HTN grade 1 diastolic dysfunction, tobacco abuse    Admitted for COPD exacerbation  Present on Admission:   . COPD with exacerbation ([place]) - Will initiate: Steroid taper  -  Antibiotics    Doxycycline, -  XopenexPRN, - scheduled duoneb,   Titrate O2 to saturation >90%. Follow patients respiratory status.  Order respiratory panel given low grade fever    Currently mentating well no evidence of symptomatic hypercarbia  . Essential hypertension -stable continue Metoprolol if persistent broncho spasm would  consider switching to Zebeta . Hyperlipidemia stable . Tobacco abuse spoke about importance of quitting order tobacco cessation protocol   Chest pain worse with coughing d-dimer unremarkable unremarkable EKG nonischemic likely secondary to musculoskeletal's causes   Other plan as per orders.  DVT prophylaxis:  Lovenox     Code Status:  FULL CODE as per patient  I had personally discussed CODE STATUS with patient   Family Communication:   Family not at   Bedside    Disposition Plan:   To home once workup is complete and patient is stable                                                         Consults called: none  Admission status:    inpatient       Level of care      medical floor              Toy Baker 11/14/2017, 8:39 PM    [facility]  Pager [phone]   after 2 AM please page floor coverage PA If 7AM-7PM, please contact the day team taking care of the patient  [email or web address]  Password [email or web address]

## 2017-11-14 NOTE — ED Notes (Signed)
Patient transported to X-ray 

## 2017-11-15 ENCOUNTER — Ambulatory Visit: Payer: Medicare Other | Admitting: Internal Medicine

## 2017-11-15 DIAGNOSIS — J441 Chronic obstructive pulmonary disease with (acute) exacerbation: Principal | ICD-10-CM

## 2017-11-15 LAB — COMPREHENSIVE METABOLIC PANEL
ALT: 20 U/L (ref 0–44)
AST: 19 U/L (ref 15–41)
Albumin: 3.3 g/dL — ABNORMAL LOW (ref 3.5–5.0)
Alkaline Phosphatase: 57 U/L (ref 38–126)
Anion gap: 7 (ref 5–15)
BUN: 14 mg/dL (ref 8–23)
CO2: 27 mmol/L (ref 22–32)
Calcium: 9.1 mg/dL (ref 8.9–10.3)
Chloride: 109 mmol/L (ref 98–111)
Creatinine, Ser: 0.55 mg/dL (ref 0.44–1.00)
GFR calc Af Amer: >60 mL/min (ref >60)
GFR calc non Af Amer: >60 mL/min (ref >60)
Glucose, Bld: 117 mg/dL — ABNORMAL HIGH (ref 70–99)
Potassium: 4.5 mmol/L (ref 3.5–5.1)
Sodium: 143 mmol/L (ref 135–145)
Total Bilirubin: 0.5 mg/dL (ref 0.3–1.2)
Total Protein: 6.4 g/dL — ABNORMAL LOW (ref 6.5–8.1)

## 2017-11-15 LAB — RESPIRATORY PANEL BY PCR

## 2017-11-15 LAB — CBC
HCT: 39.3 % (ref 36.0–46.0)
Hemoglobin: 12.8 g/dL (ref 12.0–15.0)
MCH: 30.8 pg (ref 26.0–34.0)
MCHC: 32.6 g/dL (ref 30.0–36.0)
MCV: 94.7 fL (ref 78.0–100.0)
Platelets: 212 10*3/uL (ref 150–400)
RBC: 4.15 MIL/uL (ref 3.87–5.11)
RDW: 13.7 % (ref 11.5–15.5)
WBC: 5.1 10*3/uL (ref 4.0–10.5)

## 2017-11-15 LAB — MAGNESIUM: Magnesium: 1.8 mg/dL (ref 1.7–2.4)

## 2017-11-15 LAB — PHOSPHORUS: Phosphorus: 4 mg/dL (ref 2.5–4.6)

## 2017-11-15 LAB — HIV ANTIBODY (ROUTINE TESTING W REFLEX): HIV Screen 4th Generation wRfx: NONREACTIVE

## 2017-11-15 LAB — TSH: TSH: 0.12 u[IU]/mL — ABNORMAL LOW (ref 0.350–4.500)

## 2017-11-15 MED ORDER — NICOTINE 14 MG/24HR TD PT24: 14.0000 mg | MEDICATED_PATCH | TRANSDERMAL | Status: DC

## 2017-11-15 NOTE — Progress Notes (Signed)
[name] [address], [place] [address] Phone: [phone] Subjective:    I'm seeing this patient by the request  of:    CC: Neck pain follow-up  HGD:[id number]  Erica [name] is a 67 y.o. female coming in with complaint of neck pain. She still has pain in the morning in the cervical spine but the epidural did help decrease her pain and tingling. Does still have point tender pain in her right trapezius.  Overall though patient states that she is doing better.  Was initially in the hospital with a COPD exacerbation and potential pneumonia.  Still on prednisone at this moment.    Wt Readings from Last 3 Encounters:  11/19/17 123 lb (55.8 kg)  11/14/17 126 lb (57.2 kg)  11/14/17 127 lb 1.9 oz (57.7 kg)     Past Medical History:  Diagnosis Date  . ADENOCARCINOMA, COLON, HX OF 03/2009  . ANEMIA   . BACK PAIN, LUMBAR   . Cancer [facility]) 2010   colon cancer  . CHF (congestive heart failure) ([place])   . COPD (chronic obstructive pulmonary disease) with emphysema ([place])   . Crohn's  02/2010   ileal ulcers, intol of entercort--refuses treatment  . Duodenitis 03/29/2017   Peptic  . GERD   . History of transfusion of whole blood   . HYPERLIPIDEMIA   . HYPERTENSION   . Microscopic hematuria    chronic  . Obstruction of intestine or colon (HCC)    adhesions  . OSTEOARTHRITIS   . Rectal fissure    Possible fissure  . Rotator cuff tear   . Takotsubo syndrome 12/2008  . URINARY INCONTINENCE    Past Surgical History:  Procedure Laterality Date  . ABDOMINAL HYSTERECTOMY  1994  . APPENDECTOMY  1964  . BLADDER SUSPENSION    . CESAREAN SECTION     x 3  . CHOLECYSTECTOMY  1995  . COLON SURGERY  03/2009   hemicolectomy colon cancer  . COLONOSCOPY W/ BIOPSIES AND POLYPECTOMY  01/24/2011   (Crohn's)ileitis, internal hemorrhoids  . ECTOPIC PREGNANCY SURGERY    . ESOPHAGOGASTRODUODENOSCOPY    . FACIAL COSMETIC SURGERY    . HAND SURGERY  Bilateral 1991  . KNEE SURGERY Right 1981  . right knee replacement  03/2010  . supraorbital fracture surgery    . TONSILLECTOMY  1952  . UPPER GASTROINTESTINAL ENDOSCOPY  05/16/2010   normal  . UTERINE SUSPENSION    . VIDEO BRONCHOSCOPY Bilateral 12/18/2014   Procedure: VIDEO BRONCHOSCOPY WITHOUT FLUORO;  Surgeon: Tanda Rockers, MD;  Location: [facility];  Service: Cardiopulmonary;  Laterality: Bilateral;   Social History   Socioeconomic History  . Marital status: Married    Spouse name: Not on file  . Number of children: Not on file  . Years of education: Not on file  . Highest education level: Not on file  Occupational History  . Occupation: Retired     Fish farm manager: UNEMPLOYED  Social Needs  . Financial resource strain: Not hard at all  . Food insecurity:    Worry: Never true    Inability: Never true  . Transportation needs:    Medical: No    Non-medical: No  Tobacco Use  . Smoking status: Current Every Day Smoker    Packs/day: 1.00    Years: 50.00    Pack years: 50.00    Types: Cigarettes  . Smokeless tobacco: Never Used  Substance and Sexual Activity  . Alcohol use: No  Alcohol/week: 0.0 standard drinks  . Drug use: No  . Sexual activity: Not on file  Lifestyle  . Physical activity:    Days per week: 7 days    Minutes per session: Patient refused  . Stress: Not at all  Relationships  . Social connections:    Talks on phone: More than three times a week    Gets together: More than three times a week    Attends religious service: More than 4 times per year    Active member of club or organization: Yes    Attends meetings of clubs or organizations: More than 4 times per year    Relationship status: Married  Other Topics Concern  . Not on file  Social History Narrative   Lives with husband and 2 grand dtr; 9 and 16   Allergies  Allergen Reactions  . Morphine Nausea And Vomiting   Family History  Problem Relation Age of Onset  . Colon cancer Father 34   . Hypertension Father   . Heart disease Father   . Kidney disease Father   . Colon cancer Sister 40  . Liver cancer Sister   . Other Sister        amyloidosis  . Throat cancer Mother   . Arthritis Other        Parent, other relative     Past medical history, social, surgical and family history all reviewed in electronic medical record.  No pertanent information unless stated regarding to the chief complaint.   Review of Systems:Review of systems updated and as accurate as of 11/19/17  No headache, visual changes, nausea, vomiting, diarrhea, constipation, dizziness, abdominal pain, skin rash, fevers, chills, night sweats, weight loss, swollen lymph nodes,joint swelling,  chest pain, shortness of breath, mood changes.  Positive muscle aches,, body aches  Objective  Blood pressure 128/82, pulse 93, height 5' 2"  (1.575 m), weight 123 lb (55.8 kg), SpO2 97 %. Systems examined below as of 11/19/17   General: No apparent distress alert and oriented x3 mood and affect normal, dressed appropriately.  HEENT: Pupils equal, extraocular movements intact  Respiratory: Patient's speak in full sentences and does not appear short of breath  Cardiovascular: No lower extremity edema, non tender, no erythema  Skin: Warm dry intact with no signs of infection or rash on extremities or on axial skeleton.  Abdomen: Soft nontender  Neuro: Cranial nerves II through XII are intact, neurovascularly intact in all extremities with 2+ DTRs and 2+ pulses.  Lymph: No lymphadenopathy of posterior or anterior cervical chain or axillae bilaterally.  Gait normal with good balance and coordination.  MSK:  Non tender with full range of motion and good stability and symmetric strength and tone of shoulders, elbows, wrist, hip, knee and ankles bilaterally.  Arthritic changes of multiple joints  Exam still has loss of lordosis.  Some crepitus in all planes but improved.  Minimal radicular symptoms on the right arm with  Spurling's.  Deep tendon reflexes intact         Impression and Recommendations:     This case required medical decision making of moderate complexity.      Note: This dictation was prepared with Dragon dictation along with smaller phrase technology. Any transcriptional errors that result from this process are unintentional.

## 2017-11-15 NOTE — Progress Notes (Signed)
Nutrition Brief Note  Patient identified on the Malnutrition Screening Tool (MST) Report  Wt Readings from Last 15 Encounters:  11/14/17 57.2 kg  11/14/17 57.7 kg  10/31/17 58.1 kg  10/10/17 57.2 kg  09/12/17 55.8 kg  08/29/17 56.7 kg  08/22/17 57.6 kg  05/24/17 55.8 kg  05/24/17 55.8 kg  03/29/17 54.9 kg  03/05/17 55.8 kg  03/01/17 54.9 kg  02/20/17 55.8 kg  02/14/17 54.4 kg  01/12/17 56.6 kg   Patient with PMH significant for COPD, colon cancer s/p resection, SBO, Takotsubo syndrome, GERD, HTN, Crohn;s disease, and tobacco abuse. Presents this admission with COPD exacerbation. Pt denies any loss in appetite PTA. States she consumes three meals with snacks each day. Pt ate 100% of her breakfast this morning without complication. Endorses a UBW of 125 lb and denies any recent wt loss. No skin breakdown charted. Pt does not wish to have supplementation at this time.   Body mass index is 23.05 kg/m. Patient meets criteria for normal based on current BMI.   Current diet order is heart healthy, patient is consuming approximately 100% of meals at this time. Labs and medications reviewed.   No nutrition interventions warranted at this time. If nutrition issues arise, please consult RD.   Mariana Single RD, LDN Clinical Nutrition Pager # [phone]

## 2017-11-15 NOTE — Care Management Note (Signed)
Case Management Note  Patient Details  Name: Erica [name] MRN: [id number] Date of Birth: 1950/11/18  Subjective/Objective:                  67 y.o. female with medical history significant of  COPD, clon Ca sp resection, hx of  SBO, TakoTsubo syndrome, GERD, HTN grade 1 diastolic dysfunction, tobacco abuse, Chron's     Presented with 2 weeks of SOB, now got worse associated with pleuritic pain, cough productive of thick yellow sputum low grade fever.  Dyspnea on exertion.  Few days ago at home temperature up to 100.0 associated chills she has tried to use albuterol at home but did not seem to help otherwise no headache no rash no nausea vomiting or diarrhea no abdominal pain today presented to PCP got Duoneb in the office but not better. Recently her Son and Daughter in law had a cold recently.   Oxygen saturation low 80s started on oxygen and went up to 94% on 3 L and was sent to emergency department by ambulance patient not on oxygen at baseline she continues to smoke No leg swelling no recent travel  Action/Plan: Will follow for cm needs and progression  Expected Discharge Date:  (unknown)               Expected Discharge Plan:  Home/Self Care  In-House Referral:     Discharge planning Services  CM Consult  Post Acute Care Choice:    Choice offered to:     DME Arranged:    DME Agency:     HH Arranged:    HH Agency:     Status of Service:  In process, will continue to follow  If discussed at Long Length of Stay Meetings, dates discussed:    Additional Comments:  Leeroy Cha, RN 11/15/2017, 12:10 PM

## 2017-11-15 NOTE — Progress Notes (Signed)
[facility] Progress Note  Patient: Erica [name] ZDG:[id number]   PCP: [name], MD DOB: 01/28/1951   DOA: 11/14/2017   DOS: 11/15/2017   Date of Service: the patient was seen and examined on 11/15/2017  Subjective: Breathing better but still heavy.  Still tachypneic.  No nausea no vomiting.  Still coughing.  No chest pain.  Brief hospital course: Pt. with PMH of COPD, active smoker, colon cancer S/P resection, SBO, Takatsubo syndrome, GERD, HTN, chronic diastolic CHF; admitted on 11/14/2017, presented with complaint of , was found to have parainfluenza virus COPD exacerbation. Currently further plan is continue current management.  Assessment and Plan: . COPD with exacerbation ([place]) - Will initiate: Steroid taper  -  Antibiotics    Doxycycline, -  XopenexPRN, - scheduled duoneb,   Titrate O2 to saturation >90%. Follow patients respiratory status.  Order respiratory panel given low grade fever    Currently mentating well no evidence of symptomatic hypercarbia  . Essential hypertension -stable continue Metoprolol if persistent broncho spasm would  consider switching to Zebeta . Hyperlipidemia stable . Tobacco abuse spoke about importance of quitting order tobacco cessation protocol   Chest pain worse with coughing d-dimer unremarkable unremarkable EKG nonischemic likely secondary to musculoskeletal's causes  Diet: regular diet DVT Prophylaxis: subcutaneous Heparin  Advance goals of care discussion: full code  Family Communication: no family was present at bedside, at the time of interview.  Disposition:  Discharge to home.  Consultants: none Procedures: none  Antibiotics: Anti-infectives (From admission, onward)   Start     Dose/Rate Route Frequency Ordered Stop   11/14/17 2200  doxycycline (VIBRA-TABS) tablet 100 mg     100 mg Oral Every 12 hours 11/14/17 2108 11/19/17 2159   11/14/17 1800  cefTRIAXone (ROCEPHIN) 1 g in sodium chloride 0.9 % 100 mL  IVPB     1 g 200 mL/hr over 30 Minutes Intravenous  Once 11/14/17 1757 11/14/17 2046       Objective: Physical Exam: Vitals:   11/14/17 2120 11/15/17 0354 11/15/17 1046 11/15/17 1317  BP: 140/80 136/73  133/72  Pulse: 85 63  63  Resp: 19 17  16   Temp: 98.3 F (36.8 C) 97.9 F (36.6 C)  97.7 F (36.5 C)  TempSrc:    Oral  SpO2: 94% 99% 96% 100%  Weight:      Height:        Intake/Output Summary (Last 24 hours) at 11/15/2017 1741 Last data filed at 11/15/2017 1050 Gross per 24 hour  Intake 1061.25 ml  Output -  Net 1061.25 ml   Filed Weights   11/14/17 1609  Weight: 57.2 kg   General: Alert, Awake and Oriented to Time, Place and Person. Appear in mild distress, affect appropriateEyes: PERRL, Conjunctiva normal ENT: Oral Mucosa clear moist. Neck: no JVD, no Abnormal Mass Or lumps Cardiovascular: S1 and S2 Present, no Murmur, Peripheral Pulses Present Respiratory: normal respiratory effort, Bilateral Air entry equal and Decreased, no use of accessory muscle, bilateral  Crackles, no wheezes Abdomen: Bowel Sound present, Soft and no tenderness, no hernia Skin: no redness, no Rash, no induration Extremities: no Pedal edema, no calf tenderness Neurologic: Grossly no focal neuro deficit. Bilaterally Equal motor strength  Data Reviewed: CBC: Recent Labs  Lab 11/14/17 1715 11/15/17 0609  WBC 7.7 5.1  NEUTROABS 5.0  --   HGB 13.4 12.8  HCT 40.7 39.3  MCV 94.7 94.7  PLT 219 595   Basic Metabolic Panel: Recent Labs  Lab 11/14/17  1715 11/15/17 0609  NA 141 143  K 4.1 4.5  CL 103 109  CO2 28 27  GLUCOSE 93 117*  BUN 16 14  CREATININE 0.72 0.55  CALCIUM 9.0 9.1  MG  --  1.8  PHOS  --  4.0    Liver Function Tests: Recent Labs  Lab 11/15/17 0609  AST 19  ALT 20  ALKPHOS 57  BILITOT 0.5  PROT 6.4*  ALBUMIN 3.3*   No results for input(s): LIPASE, AMYLASE in the last 168 hours. No results for input(s): AMMONIA in the last 168 hours. Coagulation  Profile: No results for input(s): INR, PROTIME in the last 168 hours. Cardiac Enzymes: No results for input(s): CKTOTAL, CKMB, CKMBINDEX, TROPONINI in the last 168 hours. BNP (last 3 results) No results for input(s): PROBNP in the last 8760 hours. CBG: No results for input(s): GLUCAP in the last 168 hours. Studies: No results found.  Scheduled Meds: . doxycycline  100 mg Oral Q12H  . enoxaparin (LOVENOX) injection  40 mg Subcutaneous Q24H  . ipratropium-albuterol  3 mL Nebulization BID  . metoprolol tartrate  25 mg Oral BID  . predniSONE  40 mg Oral Q breakfast   Continuous Infusions: PRN Meds: acetaminophen **OR** acetaminophen, albuterol, HYDROcodone-acetaminophen, ondansetron **OR** ondansetron (ZOFRAN) IV, pantoprazole  Time spent: 35 minutes  Author: Berle Mull, MD [facility] Pager: [phone] 11/15/2017 5:41 PM  If 7PM-7AM, please contact night-coverage at [email or web address], password [facility]

## 2017-11-16 ENCOUNTER — Telehealth: Payer: Self-pay | Admitting: *Deleted

## 2017-11-16 MED ORDER — DOXYCYCLINE HYCLATE 100 MG PO TABS
100.0000 mg | ORAL_TABLET | Freq: Two times a day (BID) | ORAL | 0 refills | Status: AC
Start: 2017-11-16 — End: 2017-11-19

## 2017-11-16 MED ORDER — NICOTINE 14 MG/24HR TD PT24: 14.0000 mg | MEDICATED_PATCH | TRANSDERMAL | 0 refills | Status: DC

## 2017-11-16 MED ORDER — BUDESONIDE-FORMOTEROL FUMARATE 80-4.5 MCG/ACT IN AERO: 2.0000 | INHALATION_SPRAY | Freq: Two times a day (BID) | RESPIRATORY_TRACT | 0 refills | Status: DC

## 2017-11-16 MED ORDER — PREDNISONE 10 MG PO TABS: ORAL_TABLET | ORAL | 0 refills | Status: DC

## 2017-11-16 NOTE — Care Management Important Message (Signed)
Important Message  Patient Details  Name: Erica [name] MRN: [id number] Date of Birth: September 06, 1950   Medicare Important Message Given:  Yes    Kerin Salen 11/16/2017, 11:07 AMImportant Message  Patient Details  Name: Erica [name] MRN: [id number] Date of Birth: Aug 09, 1950   Medicare Important Message Given:  Yes    Kerin Salen 11/16/2017, 11:07 AM

## 2017-11-16 NOTE — Telephone Encounter (Signed)
Transition Care Management Follow-up Telephone Call   Date discharged? 11/16/17   How have you been since you were released from the hospital? Pt states she is feeling much better than before    Do you understand why you were in the hospital? YES   Do you understand the discharge instructions? YES   Where were you discharged to? Home   Items Reviewed:  Medications reviewed: YES  Allergies reviewed: YES  Dietary changes reviewed: YES, heart healthy  Referrals reviewed: No referral needed   Functional Questionnaire:   Activities of Daily Living (ADLs):   She states she are independent in the following: ambulation, bathing and hygiene, feeding, continence, grooming, toileting and dressing States she doesn't require assistance    Any transportation issues/concerns?: NO   Any patient concerns? NO   Confirmed importance and date/time of follow-up visits scheduled YES, appt 11/26/17 Provider Appointment booked with Dr. Sharlet Salina   Confirmed with patient if condition begins to worsen call PCP or go to the ER.  Patient was given the office number and encouraged to call back with question or concerns.  : YES

## 2017-11-16 NOTE — Progress Notes (Signed)
SATURATION QUALIFICATIONS: (This note is used to comply with regulatory documentation for home oxygen)  Patient Saturations on Room Air at Rest = 96%  Patient Saturations on Room Air while Ambulating = 96%  Patient Saturations on 0 Liters of oxygen while Ambulating =96%  Please briefly explain why patient needs home oxygen: No oxygen needed

## 2017-11-16 NOTE — Care Management Note (Signed)
Case Management Note  Patient Details  Name: Erica [name] MRN: [id number] Date of Birth: 07-08-1950  Subjective/Objective:                  discharged to home  Action/Plan: No cm needs present  Expected Discharge Date:  11/16/17               Expected Discharge Plan:  Home/Self Care  In-House Referral:     Discharge planning Services  CM Consult  Post Acute Care Choice:    Choice offered to:     DME Arranged:    DME Agency:     HH Arranged:    [place] Agency:     Status of Service:  Completed, signed off  If discussed at [employer] of Stay Meetings, dates discussed:    Additional Comments:  Leeroy Cha, RN 11/16/2017, 11:54 AM

## 2017-11-19 ENCOUNTER — Ambulatory Visit (INDEPENDENT_AMBULATORY_CARE_PROVIDER_SITE_OTHER): Payer: Medicare Other | Admitting: Family Medicine

## 2017-11-19 ENCOUNTER — Encounter: Payer: Self-pay | Admitting: Family Medicine

## 2017-11-19 DIAGNOSIS — M5010 Cervical disc disorder with radiculopathy, unspecified cervical region: Secondary | ICD-10-CM

## 2017-11-19 LAB — CULTURE, BLOOD (ROUTINE X 2)
Culture: NO GROWTH
Culture: NO GROWTH
Special Requests: ADEQUATE

## 2017-11-19 MED ORDER — CYCLOBENZAPRINE HCL 10 MG PO TABS: 10.0000 mg | ORAL_TABLET | Freq: Three times a day (TID) | ORAL | 0 refills | Status: DC | PRN

## 2017-11-19 NOTE — Patient Instructions (Signed)
Good to see you  Erica [name] is your friend.  Stay active but find the line.  Refilled flexeril so you have it and try maybe at night from time to time See me again in 2-3 months

## 2017-11-19 NOTE — Assessment & Plan Note (Signed)
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  Does respond well to the epidurals.  No significant change in management.  Increase activity as tolerated.  Patient knows which type of activity seems to exacerbate it and will monitor.  Follow-up again with me 4 to 8 weeks

## 2017-11-20 NOTE — Discharge Summary (Signed)
[facility] Discharge Summary   Patient: Erica [name] BHA:[id number]   PCP: [name], MD DOB: Jan 28, 1951   Date of admission: 11/14/2017   Date of discharge: 11/16/2017   Discharge Diagnoses:  Active Problems:   Hyperlipidemia   Essential hypertension   Cigarette smoker   COPD with exacerbation (H[facility])   Tobacco abuse   COPD with acute exacerbation ([place])   COPD exacerbation ([place])   Admitted From: home Disposition:  home  Recommendations for Outpatient Follow-up:  1. Please follow up with PCP in 1 week   Follow-up Information    [name], MD. Schedule an appointment as soon as possible for a visit in 1 week(s).   Specialty:  Internal Medicine Contact information: [place] [id number] [phone]          Diet recommendation: cardiac diet  Activity: The patient is advised to gradually reintroduce usual activities.  Discharge Condition: good  Code Status: full code  History of present illness: As per the H and P dictated on admission, "Erica [name] is a 67 y.o. female with medical history significant of  COPD, clon Ca sp resection, hx of  SBO, TakoTsubo syndrome, GERD, HTN grade 1 diastolic dysfunction, tobacco abuse, Chron's     Presented with 2 weeks of SOB, now got worse associated with pleuritic pain, cough productive of thick yellow sputum low grade fever.  Dyspnea on exertion.  Few days ago at home temperature up to 100.0 associated chills she has tried to use albuterol at home but did not seem to help otherwise no headache no rash no nausea vomiting or diarrhea no abdominal pain today presented to PCP got Duoneb in the office but not better. Recently her Son and Daughter in law had a cold recently.   Oxygen saturation low 80s started on oxygen and went up to 94% on 3 L and was sent to emergency department by ambulance patient not on oxygen at baseline she continues to smoke No leg swelling no recent  travel"  Hospital Course:  Summary of her active problems in the hospital is as following. [name] COPD with exacerbation ([place])- Steroid taper Antibiotics Doxycycline, On room ain at rest and exerrtion   . Essential hypertension-stable continue Metoprololif persistent bronchospasm would consider switching to Zebeta . Hyperlipidemiastable . Tobacco abusespoke about importance of quitting order tobacco cessation protocol  Chest pain worse with coughing d-dimer unremarkable unremarkable EKG nonischemic likely secondary to musculoskeletal's causes  All other chronic medical condition were stable during the hospitalization.  Patient was ambulatory without any assistance. On the day of the discharge the patient's vitals were stable , and no other acute medical condition were reported by patient. the patient was felt safe to be discharge at home with family.  Consultants: none Procedures: none  DISCHARGE MEDICATION: Allergies as of 11/16/2017      Reactions   Morphine Nausea And Vomiting      Medication List    TAKE these medications   acetaminophen 500 MG tablet Commonly known as:  TYLENOL Take 1,000 mg by mouth every 6 (six) hours as needed for mild pain, moderate pain or headache.   albuterol 108 (90 Base) MCG/ACT inhaler Commonly known as:  PROVENTIL HFA;VENTOLIN HFA Inhale 2 puffs daily and use as needed Q 4 hr for wheeze, SOB What changed:    how much to take  how to take this  when to take this  reasons to take this  additional instructions   budesonide-formoterol 80-4.5  MCG/ACT inhaler Commonly known as:  SYMBICORT Inhale 2 puffs into the lungs 2 (two) times daily.   HYDROcodone-acetaminophen 5-325 MG tablet Commonly known as:  NORCO/VICODIN Take 1 tablet by mouth 2 (two) times daily as needed for moderate pain (pain).   metoprolol tartrate 25 MG tablet Commonly known as:  LOPRESSOR Take 1 tablet (25 mg total) by mouth 2 (two) times daily.    nicotine 14 mg/24hr patch Commonly known as:  NICODERM CQ - dosed in mg/24 hours Place 1 patch (14 mg total) onto the skin daily.   predniSONE 10 MG tablet Commonly known as:  DELTASONE Take 32m daily for 3days,Take 215mdaily for 3days,Take 1013maily for 3days, then stop.     ASK your doctor about these medications   doxycycline 100 MG tablet Commonly known as:  VIBRA-TABS Take 1 tablet (100 mg total) by mouth every 12 (twelve) hours for 3 days. Ask about: Should I take this medication?      Allergies  Allergen Reactions  . Morphine Nausea And Vomiting   Discharge Instructions    Diet - low sodium heart healthy   Complete by:  As directed    Discharge instructions   Complete by:  As directed    It is important that you read following instructions as well as go over your medication list with RN to help you understand your care after this hospitalization.  Discharge Instructions: Please follow-up with PCP in one week  Please request your primary care physician to go over all Hospital Tests and Procedure/Radiological results at the follow up,  Please get all Hospital records sent to your PCP by signing hospital release before you go home.   Do not take more than prescribed Pain, Sleep and Anxiety Medications. You were cared for by a hospitalist during your hospital stay. If you have any questions about your discharge medications or the care you received while you were in the hospital after you are discharged, you can call the unit and ask to speak with the hospitalist on call if the hospitalist that took care of you is not available.  Once you are discharged, your primary care physician will handle any further medical issues. Please note that NO REFILLS for any discharge medications will be authorized once you are discharged, as it is imperative that you return to your primary care physician (or establish a relationship with a primary care physician if you do not have one) for  your aftercare needs so that they can reassess your need for medications and monitor your lab values. You Must read complete instructions/literature along with all the possible adverse reactions/side effects for all the Medicines you take and that have been prescribed to you. Take any new Medicines after you have completely understood and accept all the possible adverse reactions/side effects. Wear Seat belts while driving. If you have smoked or chewed Tobacco in the last 2 yrs please stop smoking and/or stop any Recreational drug use.   Increase activity slowly   Complete by:  As directed      Discharge Exam: Filed Weights   11/14/17 1609  Weight: 57.2 kg   Vitals:   11/16/17 1045 11/16/17 1101  BP:    Pulse:    Resp:    Temp:    SpO2: 96% 96%   General: Appear in no distress, no Rash; Oral Mucosa moist. Cardiovascular: S1 and S2 Present, no Murmur, no JVD Respiratory: Bilateral Air entry present and Clear to Auscultation, no Crackles, no wheezes  Abdomen: Bowel Sound present, Soft and no tenderness Extremities: no Pedal edema, no calf tenderness Neurology: Grossly no focal neuro deficit.  The results of significant diagnostics from this hospitalization (including imaging, microbiology, ancillary and laboratory) are listed below for reference.    Significant Diagnostic Studies: Dg Chest 2 View  Result Date: 11/14/2017 CLINICAL DATA:  Shortness of breath, cough EXAM: CHEST - 2 VIEW COMPARISON:  None. FINDINGS: The lungs are hyperinflated likely secondary to COPD. There is no focal parenchymal opacity. There is no pleural effusion or pneumothorax. The heart and mediastinal contours are unremarkable. The osseous structures are unremarkable. IMPRESSION: No active cardiopulmonary disease. Electronically Signed   By: Kathreen Devoid   On: 11/14/2017 16:51   Dg Inject Diag/thera/inc Needle/cath/plc Epi/cerv/thor W/img  Result Date: 10/24/2017 CLINICAL DATA:  Cervical radiculopathy. Patient  reports 70% relief of pain from the prior injection. Pain has begun to recur. FLUOROSCOPY TIME:  Radiation Exposure Index (as provided by the fluoroscopic device): 8.76 uGy*m2 Fluoroscopy Time:  25 seconds Number of Acquired Images:  0 PROCEDURE: CERVICAL EPIDURAL INJECTION An interlaminar approach was performed on the right at C7-T1. A 20 gauge epidural needle was advanced using loss-of-resistance technique. DIAGNOSTIC EPIDURAL INJECTION Injection of Isovue-M 300 shows a good epidural pattern with spread above and below the level of needle placement, primarily on the right. No vascular opacification is seen. THERAPEUTIC EPIDURAL INJECTION 1.5 ml of Kenalog 40 mixed with 1 ml of 1% Lidocaine and 2 ml of normal saline were then instilled. The procedure was well-tolerated, and the patient was discharged thirty minutes following the injection in good condition. IMPRESSION: Technically successful second epidural injection on the right at C7-T1. Electronically Signed   By: [place] Morelle M.D.   On: 10/24/2017 10:54    Microbiology: Recent Results (from the past 240 hour(s))  Culture, blood (routine x 2)     Status: None   Collection Time: 11/14/17  5:14 PM  Result Value Ref Range Status   Specimen Description   Final    BLOOD LEFT ANTECUBITAL Performed at [facility] [address], [place], [place] [address]    Special Requests   Final    BOTTLES DRAWN AEROBIC AND ANAEROBIC Blood Culture adequate volume Performed at [facility] [address] [place], [place] [address]    Culture   Final    NO GROWTH 5 DAYS Performed at [facility], [place] [address] [place], [place] [address]    Report Status 11/19/2017 FINAL  Final  Culture, blood (routine x 2)     Status: None   Collection Time: 11/14/17  5:17 PM  Result Value Ref Range Status   Specimen Description   Final    BLOOD LEFT FOREARM Performed at [facility]  [address] [place], [place] [address]    Special Requests   Final    BOTTLES DRAWN AEROBIC AND ANAEROBIC Blood Culture results may not be optimal due to an excessive volume of blood received in culture bottles Performed at [facility] [address] [place], [place] [address]    Culture   Final    NO GROWTH 5 DAYS Performed at [facility], [place] [address] [place], [place] [address]    Report Status 11/19/2017 FINAL  Final  Respiratory Panel by PCR     Status: Abnormal   Collection Time: 11/14/17  9:09 PM  Result Value Ref Range Status   Adenovirus NOT DETECTED NOT  DETECTED Final   Coronavirus 229E NOT DETECTED NOT DETECTED Final   Coronavirus HKU1 NOT DETECTED NOT DETECTED Final   Coronavirus NL63 NOT DETECTED NOT DETECTED Final   Coronavirus OC43 NOT DETECTED NOT DETECTED Final   Metapneumovirus NOT DETECTED NOT DETECTED Final   Rhinovirus / Enterovirus NOT DETECTED NOT DETECTED Final   Influenza A NOT DETECTED NOT DETECTED Final   Influenza B NOT DETECTED NOT DETECTED Final   Parainfluenza Virus 1 NOT DETECTED NOT DETECTED Final   Parainfluenza Virus 2 NOT DETECTED NOT DETECTED Final   Parainfluenza Virus 3 DETECTED (A) NOT DETECTED Final   Parainfluenza Virus 4 NOT DETECTED NOT DETECTED Final   Respiratory Syncytial Virus NOT DETECTED NOT DETECTED Final   Bordetella pertussis NOT DETECTED NOT DETECTED Final   Chlamydophila pneumoniae NOT DETECTED NOT DETECTED Final   Mycoplasma pneumoniae NOT DETECTED NOT DETECTED Final     Labs: CBC: Recent Labs  Lab 11/14/17 1715 11/15/17 0609  WBC 7.7 5.1  NEUTROABS 5.0  --   HGB 13.4 12.8  HCT 40.7 39.3  MCV 94.7 94.7  PLT 219 289   Basic Metabolic Panel: Recent Labs  Lab 11/14/17 1715 11/15/17 0609  NA 141 143  K 4.1 4.5  CL 103 109  CO2 28 27  GLUCOSE 93 117*  BUN 16 14  CREATININE 0.72 0.55  CALCIUM 9.0 9.1  MG  --  1.8  PHOS  --  4.0   Liver Function Tests: Recent Labs  Lab  11/15/17 0609  AST 19  ALT 20  ALKPHOS 57  BILITOT 0.5  PROT 6.4*  ALBUMIN 3.3*   No results for input(s): LIPASE, AMYLASE in the last 168 hours. No results for input(s): AMMONIA in the last 168 hours. Cardiac Enzymes: No results for input(s): CKTOTAL, CKMB, CKMBINDEX, TROPONINI in the last 168 hours. BNP (last 3 results) Recent Labs    11/14/17 1715  BNP 18.1   CBG: No results for input(s): GLUCAP in the last 168 hours. Time spent: 35 minutes  Signed:  Berle Mull  [facility] 11/16/2017 , 6:21 PM

## 2017-11-22 ENCOUNTER — Encounter: Payer: Self-pay | Admitting: Internal Medicine

## 2017-11-22 ENCOUNTER — Ambulatory Visit (INDEPENDENT_AMBULATORY_CARE_PROVIDER_SITE_OTHER): Payer: Medicare Other | Admitting: Internal Medicine

## 2017-11-22 VITALS — BP 112/70 | HR 72 | Temp 98.1°F | Ht 62.0 in | Wt 129.0 lb

## 2017-11-22 DIAGNOSIS — G8929 Other chronic pain: Secondary | ICD-10-CM

## 2017-11-22 DIAGNOSIS — M150 Primary generalized (osteo)arthritis: Secondary | ICD-10-CM

## 2017-11-22 DIAGNOSIS — Z720 Tobacco use: Secondary | ICD-10-CM | POA: Diagnosis not present

## 2017-11-22 DIAGNOSIS — M159 Polyosteoarthritis, unspecified: Secondary | ICD-10-CM

## 2017-11-22 DIAGNOSIS — J441 Chronic obstructive pulmonary disease with (acute) exacerbation: Secondary | ICD-10-CM | POA: Diagnosis not present

## 2017-11-22 DIAGNOSIS — F1120 Opioid dependence, uncomplicated: Secondary | ICD-10-CM | POA: Diagnosis not present

## 2017-11-22 DIAGNOSIS — M8949 Other hypertrophic osteoarthropathy, multiple sites: Secondary | ICD-10-CM

## 2017-11-22 MED ORDER — ALBUTEROL SULFATE (2.5 MG/3ML) 0.083% IN NEBU: 2.5000 mg | INHALATION_SOLUTION | Freq: Four times a day (QID) | RESPIRATORY_TRACT | 1 refills | Status: DC | PRN

## 2017-11-22 NOTE — Progress Notes (Signed)
   Subjective:    Patient ID: Erica [name], female    DOB: Sep 12, 1950, 67 y.o.   MRN: [id number]  HPI The patient is a 67 YO female coming in for hospital follow up (in for copd exacerbation and given doxycycline and prednisone, still taking prednisone, did markedly better with nebulizer in the hospital and wants to know if she can get one for home as this worked better for SOB than her albuterol inhaler, needs help with coverage of symbicort and albuterol medicine) and chronic pain management (takes hydrocodone BID for back pain, denies new side effects, denies overuse or abuse, denies worsening pain or injury, some more sore from coughing more with hospital flare, denies constipation or confusion or memory change). Still smoking.   PMH, [facility], social history reviewed and updated.   Review of Systems  Constitutional: Negative.   HENT: Negative.   Eyes: Negative.   Respiratory: Positive for shortness of breath. Negative for cough and chest tightness.   Cardiovascular: Negative for chest pain, palpitations and leg swelling.  Gastrointestinal: Negative for abdominal distention, abdominal pain, constipation, diarrhea, nausea and vomiting.  Musculoskeletal: Positive for arthralgias, back pain and myalgias.  Skin: Negative.   Neurological: Negative.   Psychiatric/Behavioral: Negative.       Objective:   Physical Exam  Constitutional: She is oriented to person, place, and time. She appears well-developed and well-nourished.  HENT:  Head: Normocephalic and atraumatic.  Eyes: EOM are normal.  Neck: Normal range of motion.  Cardiovascular: Normal rate and regular rhythm.  Pulmonary/Chest: Effort normal and breath sounds normal. No respiratory distress. She has no wheezes. She has no rales.  Abdominal: Soft. Bowel sounds are normal. She exhibits no distension. There is no tenderness. There is no rebound.  Musculoskeletal: She exhibits no edema.  Neurological: She is alert and oriented to  person, place, and time. Coordination normal.  Skin: Skin is warm and dry.  Psychiatric: She has a normal mood and affect.   Vitals:   11/22/17 0822  BP: 112/70  Pulse: 72  Temp: 98.1 F (36.7 C)  TempSrc: Oral  SpO2: 96%  Weight: 129 lb (58.5 kg)  Height: 5' 2"  (1.575 m)      Assessment & Plan:

## 2017-11-22 NOTE — Assessment & Plan Note (Signed)
Reminded that she needs to stop smoking to avoid further lung complications. She will consider but is not sure now is a good time to quit.

## 2017-11-22 NOTE — Assessment & Plan Note (Signed)
[place] narcotic database reviewed and appropriate. She has had inappropriate UDS in the last year and needs q 6 month UDS minimum. Preferably at least 1 random. Refill hydrocodone when due on 12/12/17. See back in 3 months. Contract up to date on file and due for renewal in Nov 2019. She is aware of the risk and benefits of narcotics including risk of addiction, dependence and wants to continue for QOL.

## 2017-11-22 NOTE — Patient Instructions (Signed)
We will send in the nebulizer prescription to advanced and the medicine to your pharmacy for the machine. You can use this machine every 6 hours as needed for breathing problems.   For the next 1-2 weeks consider using the machine twice a day for maintenance to help you breathe better while you are getting off the steroids.

## 2017-11-22 NOTE — Assessment & Plan Note (Signed)
[place] narcotic database reviewed and appropriate. She has had inappropriate UDS in the last year and needs q 6 month UDS minimum. Preferably at least 1 random. Refill hydrocodone when due on 12/12/17. See back in 3 months. Contract up to date on file and due for renewal in Nov 2019. She is aware of the risk and benefits of narcotics including risk of addiction, dependence and wants to continue for QOL.

## 2017-11-22 NOTE — Assessment & Plan Note (Signed)
[place] narcotic database reviewed and appropriate. She has had inappropriate UDS in the last year and needs q 6 month UDS minimum. Preferably at least 1 random. Refill hydrocodone when due on 12/12/17. See back in 3 months. Contract up to date on file and due for renewal in Nov 2019. She is aware of the risk and benefits of narcotics including risk of addiction, dependence and wants to continue for QOL.

## 2017-11-22 NOTE — Assessment & Plan Note (Signed)
She is improving and lung exam is clear today. Will finish doxycycline and prednisone. Rx for nebulizer and albuterol nebulizer solution. She will continue symbicort.

## 2017-11-26 ENCOUNTER — Inpatient Hospital Stay: Payer: Medicare Other | Admitting: Internal Medicine

## 2017-12-04 ENCOUNTER — Other Ambulatory Visit: Payer: Self-pay | Admitting: Internal Medicine

## 2017-12-13 ENCOUNTER — Other Ambulatory Visit: Payer: Self-pay | Admitting: Internal Medicine

## 2017-12-13 MED ORDER — HYDROCODONE-ACETAMINOPHEN 5-325 MG PO TABS: 1.0000 | ORAL_TABLET | Freq: Two times a day (BID) | ORAL | 0 refills | Status: DC | PRN

## 2017-12-13 NOTE — Telephone Encounter (Signed)
Rx refill request: hydrocodone- acetaminophen 5-325 mg        Last filled: 11/12/17  LOV: 11/22/17  PCP: [place]:  verified

## 2017-12-13 NOTE — Telephone Encounter (Signed)
Copied from [place] [id number]. Topic: Quick Communication - See Telephone Encounter >> Dec 13, 2017  3:13 PM Hewitt Shorts wrote: Pt is needing to get a refill on her hydrocodone   Best number [phone]  [facility] [address]

## 2017-12-13 NOTE — Telephone Encounter (Signed)
Control database checked last refill: 11/12/2017

## 2018-01-11 ENCOUNTER — Other Ambulatory Visit: Payer: Self-pay | Admitting: Internal Medicine

## 2018-01-11 MED ORDER — HYDROCODONE-ACETAMINOPHEN 5-325 MG PO TABS: 1.0000 | ORAL_TABLET | Freq: Two times a day (BID) | ORAL | 0 refills | Status: DC | PRN

## 2018-01-11 NOTE — Telephone Encounter (Signed)
Copied from [place] [id number]. Topic: Quick Communication - Rx Refill/Question >> Jan 11, 2018 10:26 AM Alfredia Ferguson R wrote: Medication: HYDROcodone-acetaminophen (NORCO/VICODIN) 5-325 MG tablet   Has the patient contacted their pharmacy? Yes  Preferred Pharmacy (with phone number or street name): [place], [place] [phone] (Phone) [phone] (Fax)    Agent: Please be advised that RX refills may take up to 3 business days. We ask that you follow-up with your pharmacy.

## 2018-01-11 NOTE — Telephone Encounter (Signed)
Requested medication (s) are due for refill today: yes  Requested medication (s) are on the active medication list: yes  Last refill:  12/13/17  Future visit scheduled: yes  Notes to clinic:  undelegated    Requested Prescriptions  Pending Prescriptions Disp Refills   HYDROcodone-acetaminophen (NORCO/VICODIN) 5-325 MG tablet 60 tablet 0    Sig: Take 1 tablet by mouth 2 (two) times daily as needed for moderate pain (pain).     Not Delegated - Analgesics:  Opioid Agonist Combinations Failed - 01/11/2018 10:37 AM      Failed - This refill cannot be delegated      Failed - Urine Drug Screen completed in last 360 days.      Passed - Valid encounter within last 6 months    Recent Outpatient Visits          1 month ago COPD exacerbation ([place])   [place], Elizabeth A, MD   1 month ago Cervical disc disorder with radiculopathy of cervical region   [place], [place], DO   1 month ago COPD with exacerbation [facility])   [place], Marvis Repress, FNP   2 months ago SOB (shortness of breath)   [place], MD   3 months ago Cervical disc disorder with radiculopathy of cervical region   [place], [place], DO      Future Appointments            In 1 week Lyndal Pulley, DO [place], [place]   In 1 month Sharlet Salina Real Cons, MD [place], [facility]

## 2018-01-11 NOTE — Telephone Encounter (Signed)
Control database checked last refill: 12/13/2017 LOV:11/22/2017 NOV:02/22/2018

## 2018-01-19 NOTE — Progress Notes (Signed)
[name] [address] [place], [place] [address] Phone: [phone] Subjective:    I Erica [name] am serving as a Education administrator for Dr. Hulan Saas.  CC: Neck and back pain follow-up  HXT:[id number]  Erica [name] is a 67 y.o. female coming in with complaint of neck pain. States that she is not doing well. Both the neck and back are painful.  Patient has degenerative disc disease.  Does have chronic pain syndrome.  Continues to use muscle relaxer fairly regularly.  Worsening right shoulder pain.  Has had a bursitis previously.  Waking her up at night.  Patient's past medical history is significant for cancer as well.  Likely though her weight has fluctuated as much and she is holding her weight better.  History of severe COPD as well.  Patient states that the aching and pain to stop her from certain activity.  Patient has missed different activities with family because of all these pains.       Past Medical History:  Diagnosis Date  . ADENOCARCINOMA, COLON, HX OF 03/2009  . ANEMIA   . BACK PAIN, LUMBAR   . Cancer [facility]) 2010   colon cancer  . CHF (congestive heart failure) ([place])   . COPD (chronic obstructive pulmonary disease) with emphysema ([place])   . Crohn's  02/2010   ileal ulcers, intol of entercort--refuses treatment  . Duodenitis 03/29/2017   Peptic  . GERD   . History of transfusion of whole blood   . HYPERLIPIDEMIA   . HYPERTENSION   . Microscopic hematuria    chronic  . Obstruction of intestine or colon (HCC)    adhesions  . OSTEOARTHRITIS   . Rectal fissure    Possible fissure  . Rotator cuff tear   . Takotsubo syndrome 12/2008  . URINARY INCONTINENCE    Past Surgical History:  Procedure Laterality Date  . ABDOMINAL HYSTERECTOMY  1994  . APPENDECTOMY  1964  . BLADDER SUSPENSION    . CESAREAN SECTION     x 3  . CHOLECYSTECTOMY  1995  . COLON SURGERY  03/2009   hemicolectomy colon cancer  . COLONOSCOPY W/ BIOPSIES AND  POLYPECTOMY  01/24/2011   (Crohn's)ileitis, internal hemorrhoids  . ECTOPIC PREGNANCY SURGERY    . ESOPHAGOGASTRODUODENOSCOPY    . FACIAL COSMETIC SURGERY    . HAND SURGERY Bilateral 1991  . KNEE SURGERY Right 1981  . right knee replacement  03/2010  . supraorbital fracture surgery    . TONSILLECTOMY  1952  . UPPER GASTROINTESTINAL ENDOSCOPY  05/16/2010   normal  . UTERINE SUSPENSION    . VIDEO BRONCHOSCOPY Bilateral 12/18/2014   Procedure: VIDEO BRONCHOSCOPY WITHOUT FLUORO;  Surgeon: Tanda Rockers, MD;  Location: [facility];  Service: Cardiopulmonary;  Laterality: Bilateral;   Social History   Socioeconomic History  . Marital status: Married    Spouse name: Not on file  . Number of children: Not on file  . Years of education: Not on file  . Highest education level: Not on file  Occupational History  . Occupation: Retired     Fish farm manager: UNEMPLOYED  Social Needs  . Financial resource strain: Not hard at all  . Food insecurity:    Worry: Never true    Inability: Never true  . Transportation needs:    Medical: No    Non-medical: No  Tobacco Use  . Smoking status: Current Every Day Smoker    Packs/day: 1.00    Years: 50.00  Pack years: 50.00    Types: Cigarettes  . Smokeless tobacco: Never Used  Substance and Sexual Activity  . Alcohol use: No    Alcohol/week: 0.0 standard drinks  . Drug use: No  . Sexual activity: Not on file  Lifestyle  . Physical activity:    Days per week: 7 days    Minutes per session: Patient refused  . Stress: Not at all  Relationships  . Social connections:    Talks on phone: More than three times a week    Gets together: More than three times a week    Attends religious service: More than 4 times per year    Active member of club or organization: Yes    Attends meetings of clubs or organizations: More than 4 times per year    Relationship status: Married  Other Topics Concern  . Not on file  Social History Narrative   Lives with  husband and 2 grand dtr; 9 and 16   Allergies  Allergen Reactions  . Morphine Nausea And Vomiting   Family History  Problem Relation Age of Onset  . Colon cancer Father 55  . Hypertension Father   . Heart disease Father   . Kidney disease Father   . Colon cancer Sister 73  . Liver cancer Sister   . Other Sister        amyloidosis  . Throat cancer Mother   . Arthritis Other        Parent, other relative    Current Outpatient Medications (Endocrine & Metabolic):  .  predniSONE (DELTASONE) 10 MG tablet, Take 55m daily for 3days,Take 260mdaily for 3days,Take 1081maily for 3days, then stop.  Current Outpatient Medications (Cardiovascular):  .  metoprolol tartrate (LOPRESSOR) 25 MG tablet, Take 1 tablet (25 mg total) by mouth 2 (two) times daily.  Current Outpatient Medications (Respiratory):  .  albuterol (PROVENTIL HFA;VENTOLIN HFA) 108 (90 Base) MCG/ACT inhaler, INHALE 2 PUFFS BY MOUTH EVERY 4 HOURS AS NEEDED FOR WHEEZING OR SHORTNESS OF BREATH .  albuterol (PROVENTIL) (2.5 MG/3ML) 0.083% nebulizer solution, Take 3 mLs (2.5 mg total) by nebulization every 6 (six) hours as needed for wheezing or shortness of breath. .  budesonide-formoterol (SYMBICORT) 80-4.5 MCG/ACT inhaler, Inhale 2 puffs into the lungs 2 (two) times daily.  Current Outpatient Medications (Analgesics):  .  acetaminophen (TYLENOL) 500 MG tablet, Take 1,000 mg by mouth every 6 (six) hours as needed for mild pain, moderate pain or headache. .  Marland KitchenYDROcodone-acetaminophen (NORCO/VICODIN) 5-325 MG tablet, Take 1 tablet by mouth 2 (two) times daily as needed for moderate pain (pain).   Current Outpatient Medications (Other):  .  cyclobenzaprine (FLEXERIL) 10 MG tablet, Take 1 tablet (10 mg total) by mouth 3 (three) times daily as needed for muscle spasms. .  nicotine (NICODERM CQ - DOSED IN MG/24 HOURS) 14 mg/24hr patch, Place 1 patch (14 mg total) onto the skin daily.    Past medical history, social, surgical and  family history all reviewed in electronic medical record.  No pertanent information unless stated regarding to the chief complaint.   Review of Systems:  No headache, visual changes, nausea, vomiting, diarrhea, constipation, dizziness, abdominal pain, skin rash, fevers, chills, night sweats, weight loss, swollen lymph nodes chest pain, shortness of breath, mood changes.  Positive muscle aches, joint swelling, body aches  Objective  Blood pressure 140/80, pulse 81, height 5' 2"  (1.575 m), weight 132 lb (59.9 kg), SpO2 91 %.    General: No  apparent distress alert and oriented x3 mood and affect normal, dressed appropriately.  HEENT: Pupils equal, extraocular movements intact  Respiratory: Patient's speak in full sentences and does not appear short of breath  Cardiovascular: No lower extremity edema, non tender, no erythema  Skin: Warm dry intact with no signs of infection or rash on extremities or on axial skeleton.  Abdomen: Soft nontender  Neuro: Cranial nerves II through XII are intact, neurovascularly intact in all extremities with 2+ DTRs and 2+ pulses.  Lymph: No lymphadenopathy of posterior or anterior cervical chain or axillae bilaterally.  Gait antalgic.  MSK:  tender with limited range of motion and good stability and symmetric strength and tone of  elbows, wrist, hip, knee and ankles bilaterally.  Arthritic changes of multiple joints Right shoulder exam does have some decreased range of motion in all planes.  Positive impingement.  With 4 out of 5 strength but symmetric to the contralateral side.  Positive Spurling's of the cervical spine.  Patient does have radicular symptoms going down the right arm.  Mild weakness in grip strength.  After informed written and verbal consent, patient was seated on exam table. Right shoulder was prepped with alcohol swab and utilizing posterior approach, patient's right glenohumeral space was injected with 4:1  marcaine 0.5%: Kenalog 20m/dL. Patient  tolerated the procedure well without immediate complications.    Impression and Recommendations:     This case required medical decision making of moderate complexity. The above documentation has been reviewed and is accurate and complete ZLyndal Pulley DO       Note: This dictation was prepared with Dragon dictation along with smaller phrase technology. Any transcriptional errors that result from this process are unintentional.

## 2018-01-21 ENCOUNTER — Ambulatory Visit (INDEPENDENT_AMBULATORY_CARE_PROVIDER_SITE_OTHER): Payer: Medicare Other | Admitting: Family Medicine

## 2018-01-21 ENCOUNTER — Encounter: Payer: Self-pay | Admitting: Family Medicine

## 2018-01-21 VITALS — BP 140/80 | HR 81 | Ht 62.0 in | Wt 132.0 lb

## 2018-01-21 DIAGNOSIS — M12811 Other specific arthropathies, not elsewhere classified, right shoulder: Secondary | ICD-10-CM | POA: Diagnosis not present

## 2018-01-21 DIAGNOSIS — M75102 Unspecified rotator cuff tear or rupture of left shoulder, not specified as traumatic: Secondary | ICD-10-CM

## 2018-01-21 DIAGNOSIS — M5416 Radiculopathy, lumbar region: Secondary | ICD-10-CM | POA: Diagnosis not present

## 2018-01-21 DIAGNOSIS — M7551 Bursitis of right shoulder: Secondary | ICD-10-CM

## 2018-01-21 DIAGNOSIS — M75101 Unspecified rotator cuff tear or rupture of right shoulder, not specified as traumatic: Secondary | ICD-10-CM

## 2018-01-21 DIAGNOSIS — M5010 Cervical disc disorder with radiculopathy, unspecified cervical region: Secondary | ICD-10-CM | POA: Diagnosis not present

## 2018-01-21 DIAGNOSIS — M12812 Other specific arthropathies, not elsewhere classified, left shoulder: Secondary | ICD-10-CM | POA: Diagnosis not present

## 2018-01-21 NOTE — Assessment & Plan Note (Signed)
Moderate to severe degenerative disc disease of the cervical spine.  Has responded fairly well to epidurals in the past.  Discussed icing regimen and home exercise.  Discussed which activities to do which wants to avoid.  Increase activity as tolerated.  Follow-up with me again in 4 to 8 weeks can repeat epidural if necessary.

## 2018-01-21 NOTE — Assessment & Plan Note (Signed)
Continues to have some difficulty.  Patient is on chronic prednisone.  We discussed with patient's history of cancer that we may need to consider further imaging again.  Patient's will consider it.  Following up with me again in 4 to 8 weeks

## 2018-01-21 NOTE — Patient Instructions (Addendum)
Good to see you  Injected the shoulder today  Ice is your friend Continue flexeril as you need it.  Can get another injection in your neck if you need  See me again in 3 months

## 2018-01-23 NOTE — Progress Notes (Signed)
Cardiology Office Note:    Date:  01/24/2018   ID:  Erica [name], DOB 03/21/1951, MRN [id number]  PCP:  [name], MD  Cardiologist:  Sinclair Grooms, MD   Referring MD: [name], *   Chief Complaint  Patient presents with  . Congestive Heart Failure    History of Present Illness:    Erica [name] is a 67 y.o. female with a hx of stress-induced cardiomyopathy 2010, heavy tobacco use , hyperlipidemia, hypertension, an history of chronic diastolic heart failure.   She is doing well.  She has not had shortness of breath or chest pain.  No orthopnea, PND, palpitations, or specific cardiac complaints.  In fact, she feels that her heart is only system that is properly functioning.  She has not noted lower extremity edema.  She has rare palpitations.   Past Medical History:  Diagnosis Date  . ADENOCARCINOMA, COLON, HX OF 03/2009  . ANEMIA   . BACK PAIN, LUMBAR   . Cancer [facility]) 2010   colon cancer  . CHF (congestive heart failure) ([place])   . COPD (chronic obstructive pulmonary disease) with emphysema ([place])   . Crohn's  02/2010   ileal ulcers, intol of entercort--refuses treatment  . Duodenitis 03/29/2017   Peptic  . GERD   . History of transfusion of whole blood   . HYPERLIPIDEMIA   . HYPERTENSION   . Microscopic hematuria    chronic  . Obstruction of intestine or colon (HCC)    adhesions  . OSTEOARTHRITIS   . Rectal fissure    Possible fissure  . Rotator cuff tear   . Takotsubo syndrome 12/2008  . URINARY INCONTINENCE     Past Surgical History:  Procedure Laterality Date  . ABDOMINAL HYSTERECTOMY  1994  . APPENDECTOMY  1964  . BLADDER SUSPENSION    . CESAREAN SECTION     x 3  . CHOLECYSTECTOMY  1995  . COLON SURGERY  03/2009   hemicolectomy colon cancer  . COLONOSCOPY W/ BIOPSIES AND POLYPECTOMY  01/24/2011   (Crohn's)ileitis, internal hemorrhoids  . ECTOPIC PREGNANCY SURGERY    . ESOPHAGOGASTRODUODENOSCOPY    . FACIAL  COSMETIC SURGERY    . HAND SURGERY Bilateral 1991  . KNEE SURGERY Right 1981  . right knee replacement  03/2010  . supraorbital fracture surgery    . TONSILLECTOMY  1952  . UPPER GASTROINTESTINAL ENDOSCOPY  05/16/2010   normal  . UTERINE SUSPENSION    . VIDEO BRONCHOSCOPY Bilateral 12/18/2014   Procedure: VIDEO BRONCHOSCOPY WITHOUT FLUORO;  Surgeon: Tanda Rockers, MD;  [facility];  Service: Cardiopulmonary;  Laterality: Bilateral;    Current Medications: Current Meds  Medication Sig  . acetaminophen (TYLENOL) 500 MG tablet Take 1,000 mg by mouth every 6 (six) hours as needed for mild pain, moderate pain or headache.  . albuterol (PROVENTIL HFA;VENTOLIN HFA) 108 (90 Base) MCG/ACT inhaler INHALE 2 PUFFS BY MOUTH EVERY 4 HOURS AS NEEDED FOR WHEEZING OR SHORTNESS OF BREATH  . albuterol (PROVENTIL) (2.5 MG/3ML) 0.083% nebulizer solution Take 3 mLs (2.5 mg total) by nebulization every 6 (six) hours as needed for wheezing or shortness of breath.  . cyclobenzaprine (FLEXERIL) 10 MG tablet Take 1 tablet (10 mg total) by mouth 3 (three) times daily as needed for muscle spasms.  [name] HYDROcodone-acetaminophen (NORCO/VICODIN) 5-325 MG tablet Take 1 tablet by mouth 2 (two) times daily as needed for moderate pain (pain).  . metoprolol tartrate (LOPRESSOR) 25 MG tablet Take 1 tablet (  25 mg total) by mouth 2 (two) times daily.  . [DISCONTINUED] metoprolol tartrate (LOPRESSOR) 25 MG tablet Take 1 tablet (25 mg total) by mouth 2 (two) times daily.     Allergies:   Morphine   Social History   Socioeconomic History  . Marital status: Married    Spouse name: Not on file  . Number of children: Not on file  . Years of education: Not on file  . Highest education level: Not on file  Occupational History  . Occupation: Retired     Fish farm manager: UNEMPLOYED  Social Needs  . Financial resource strain: Not hard at all  . Food insecurity:    Worry: Never true    Inability: Never true  .  Transportation needs:    Medical: No    Non-medical: No  Tobacco Use  . Smoking status: Current Every Day Smoker    Packs/day: 1.00    Years: 50.00    Pack years: 50.00    Types: Cigarettes  . Smokeless tobacco: Never Used  Substance and Sexual Activity  . Alcohol use: No    Alcohol/week: 0.0 standard drinks  . Drug use: No  . Sexual activity: Not on file  Lifestyle  . Physical activity:    Days per week: 7 days    Minutes per session: Patient refused  . Stress: Not at all  Relationships  . Social connections:    Talks on phone: More than three times a week    Gets together: More than three times a week    Attends religious service: More than 4 times per year    Active member of club or organization: Yes    Attends meetings of clubs or organizations: More than 4 times per year    Relationship status: Married  Other Topics Concern  . Not on file  Social History Narrative   Lives with husband and 2 grand dtr; 33 and 28     Family History: The patient's family history includes Arthritis in her other; Colon cancer (age of onset: 69) in her sister; Colon cancer (age of onset: 49) in her father; Heart disease in her father; Hypertension in her father; Kidney disease in her father; Liver cancer in her sister; Other in her sister; Throat cancer in her mother.  ROS:   Please see the history of present illness.    Back pain, poor appetite, weight loss, leg pain, and insomnia.  Also is having some joint swelling.  Was hospitalized earlier in the year with respiratory distress.  All other systems reviewed and are negative.  EKGs/Labs/Other Studies Reviewed:    The following studies were reviewed today: No recent or updated cardiac imaging  EKG:  EKG is not ordered today.  The ekg November 15, 2017 demonstrates low voltage, leftward axis, sinus rhythm, and no acute ST-T wave change or significant difference from historical EKGs from 1 year prior.  Recent Labs: 11/14/2017: B  Natriuretic Peptide 18.1 11/15/2017: ALT 20; BUN 14; Creatinine, Ser 0.55; Hemoglobin 12.8; Magnesium 1.8; Platelets 212; Potassium 4.5; Sodium 143; TSH 0.120  Recent Lipid Panel    Component Value Date/Time   CHOL 186 05/24/2017 0918   TRIG 104.0 05/24/2017 0918   HDL 58.00 05/24/2017 0918   CHOLHDL 3 05/24/2017 0918   VLDL 20.8 05/24/2017 0918   LDLCALC 108 (H) 05/24/2017 0918   LDLDIRECT 138.5 09/30/2012 1518    Physical Exam:    VS:  BP 122/76   Pulse 86   Ht 5' 2"  (  1.575 m)   Wt 129 lb 1.9 oz (58.6 kg)   BMI 23.62 kg/m     Wt Readings from Last 3 Encounters:  01/24/18 129 lb 1.9 oz (58.6 kg)  01/21/18 132 lb (59.9 kg)  11/22/17 129 lb (58.5 kg)     GEN:  Well nourished, well developed in no acute distress HEENT: Normal NECK: No JVD. LYMPHATICS: No lymphadenopathy CARDIAC: RRR, no murmur, no gallop, no edema. VASCULAR: 2+ bilateral radial and carotid pulses.  No bruits. RESPIRATORY:  Clear to auscultation without rales, wheezing or rhonchi  ABDOMEN: Soft, non-tender, non-distended, No pulsatile mass, MUSCULOSKELETAL: No deformity  SKIN: Warm and dry NEUROLOGIC:  Alert and oriented x 3 PSYCHIATRIC:  Normal affect   ASSESSMENT:    1. Essential hypertension   2. Chronic diastolic HF (heart failure) ([place])   3. Mixed hyperlipidemia   4. Tobacco abuse   5. COPD  GOLD II    6. Takotsubo syndrome    PLAN:    In order of problems listed above:  1. Excellent control.  Target 130/80 mmHg. 2. No evidence of volume overload. 3. Target LDL less than 70.  Most recently 108 in February 2019. 4. Not currently smoking. 5. Not addressed 6. No recurrence.  Encourage moderate aerobic activity to achieve 150 minutes or greater per week.  Notify us of chest pain.  Clinical follow-up in 1 year.  Continue metoprolol.   Medication Adjustments/Labs and Tests Ordered: Current medicines are reviewed at length with the patient today.  Concerns regarding medicines are outlined  above.  No orders of the defined types were placed in this encounter.  Meds ordered this encounter  Medications  . metoprolol tartrate (LOPRESSOR) 25 MG tablet    Sig: Take 1 tablet (25 mg total) by mouth 2 (two) times daily.    Dispense:  180 tablet    Refill:  3    Patient Instructions  Medication Instructions:  Your physician recommends that you continue on your current medications as directed. Please refer to the Current Medication list given to you today.  If you need a refill on your cardiac medications before your next appointment, please call your pharmacy.   Lab work: None If you have labs (blood work) drawn today and your tests are completely normal, you will receive your results only by: [name] MyChart Message (if you have MyChart) OR . A paper copy in the mail If you have any lab test that is abnormal or we need to change your treatment, we will call you to review the results.  Testing/Procedures: None  Follow-Up: At [facility], you and your health needs are our priority.  As part of our continuing mission to provide you with exceptional heart care, we have created designated Provider Care Teams.  These Care Teams include your primary Cardiologist (physician) and Advanced Practice Providers (APPs -  Physician Assistants and Nurse Practitioners) who all work together to provide you with the care you need, when you need it. You will need a follow up appointment in 12 months.  Please call our office 2 months in advance to schedule this appointment.  You may see Sinclair Grooms, MD or one of the following Advanced Practice Providers on your designated Care Team:   Truitt Merle, NP Cecilie Kicks, NP . Kathyrn Drown, NP  Any Other Special Instructions Will Be Listed Below (If Applicable).      Signed, Sinclair Grooms, MD  01/24/2018 4:54 PM    [facility]

## 2018-01-24 ENCOUNTER — Ambulatory Visit (INDEPENDENT_AMBULATORY_CARE_PROVIDER_SITE_OTHER): Payer: Medicare Other | Admitting: Interventional Cardiology

## 2018-01-24 ENCOUNTER — Encounter: Payer: Self-pay | Admitting: Interventional Cardiology

## 2018-01-24 VITALS — BP 122/76 | HR 86 | Ht 62.0 in | Wt 129.1 lb

## 2018-01-24 DIAGNOSIS — I5181 Takotsubo syndrome: Secondary | ICD-10-CM

## 2018-01-24 DIAGNOSIS — Z720 Tobacco use: Secondary | ICD-10-CM | POA: Diagnosis not present

## 2018-01-24 DIAGNOSIS — I5032 Chronic diastolic (congestive) heart failure: Secondary | ICD-10-CM | POA: Diagnosis not present

## 2018-01-24 DIAGNOSIS — E782 Mixed hyperlipidemia: Secondary | ICD-10-CM | POA: Diagnosis not present

## 2018-01-24 DIAGNOSIS — I10 Essential (primary) hypertension: Secondary | ICD-10-CM

## 2018-01-24 DIAGNOSIS — J449 Chronic obstructive pulmonary disease, unspecified: Secondary | ICD-10-CM

## 2018-01-24 MED ORDER — METOPROLOL TARTRATE 25 MG PO TABS: 25.0000 mg | ORAL_TABLET | Freq: Two times a day (BID) | ORAL | 3 refills | Status: DC

## 2018-01-24 NOTE — Patient Instructions (Signed)

## 2018-02-07 ENCOUNTER — Other Ambulatory Visit: Payer: Self-pay | Admitting: Internal Medicine

## 2018-02-07 NOTE — Telephone Encounter (Signed)
Requested medication (s) are due for refill today: yes  Requested medication (s) are on the active medication list: yes  Last refill:  01/11/18 #60  Future visit scheduled: yes  Notes to clinic:  LOV on 11/22/17    Requested Prescriptions  Pending Prescriptions Disp Refills   HYDROcodone-acetaminophen (NORCO/VICODIN) 5-325 MG tablet 60 tablet 0    Sig: Take 1 tablet by mouth 2 (two) times daily as needed for moderate pain (pain).     Not Delegated - Analgesics:  Opioid Agonist Combinations Failed - 02/07/2018  2:13 PM      Failed - This refill cannot be delegated      Failed - Urine Drug Screen completed in last 360 days.      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

## 2018-02-07 NOTE — Telephone Encounter (Signed)
Check [place] registry last filled 01/11/2018.Marland KitchenJohny [name]

## 2018-02-07 NOTE — Telephone Encounter (Signed)
Copied from [place] [phone]. Topic: Quick Communication - Rx Refill/Question >> Feb 07, 2018 11:58 AM Percell Belt A wrote: Medication: HYDROcodone-acetaminophen (NORCO/VICODIN) 5-325 MG tablet [[id number]]   Has the patient contacted their pharmacy? No. (Agent: If no, request that the patient contact the pharmacy for the refill.) (Agent: If yes, when and what did the pharmacy advise?)  Preferred Pharmacy (with phone number or street name): [place], [place] (Phone)   Agent: Please be advised that RX refills may take up to 3 business days. We ask that you follow-up with your pharmacy.

## 2018-02-08 ENCOUNTER — Ambulatory Visit (INDEPENDENT_AMBULATORY_CARE_PROVIDER_SITE_OTHER): Payer: Medicare Other | Admitting: *Deleted

## 2018-02-08 DIAGNOSIS — Z23 Encounter for immunization: Secondary | ICD-10-CM

## 2018-02-08 MED ORDER — HYDROCODONE-ACETAMINOPHEN 5-325 MG PO TABS: 1.0000 | ORAL_TABLET | Freq: Two times a day (BID) | ORAL | 0 refills | Status: DC | PRN

## 2018-02-22 ENCOUNTER — Ambulatory Visit (INDEPENDENT_AMBULATORY_CARE_PROVIDER_SITE_OTHER): Payer: Medicare Other | Admitting: Internal Medicine

## 2018-02-22 ENCOUNTER — Encounter: Payer: Self-pay | Admitting: Internal Medicine

## 2018-02-22 VITALS — BP 130/80 | HR 84 | Temp 97.7°F | Ht 62.0 in | Wt 129.0 lb

## 2018-02-22 DIAGNOSIS — M5010 Cervical disc disorder with radiculopathy, unspecified cervical region: Secondary | ICD-10-CM

## 2018-02-22 DIAGNOSIS — G8929 Other chronic pain: Secondary | ICD-10-CM | POA: Diagnosis not present

## 2018-02-22 DIAGNOSIS — F1120 Opioid dependence, uncomplicated: Secondary | ICD-10-CM | POA: Diagnosis not present

## 2018-02-22 NOTE — Assessment & Plan Note (Signed)
Taking hydrocodone 5/325 BID and having reasonable control with addition of flexeril at times. Does have some worsening recently and she is seeing sports medicine and back for injections to help her overall pain.

## 2018-02-22 NOTE — Progress Notes (Signed)
   Subjective:    Patient ID: Erica [name], female    DOB: [birth year], 67 y.o.   MRN: [id number]  HPI The patient is a 67 YO female coming in for chronic pain management (taking hydrocodone 5/325 mg BID for back pain, denies taking more medication lately, denies side effects, does have more pain with the winter months and is using other medications and other strategies to help with that) and back pain (slight increase as the weather has gotten colder, denies taking more medication, is using flexeril rarely to help, seeing back specialist for injections and sports medicine for increasing pain as well, denies falls or injury recently) and opioid dependence (very careful about taking her medication only twice per day and does not misplace any medication, she denies taking more than usual, denies running out early, does have some diarrhea associated with foods, denies constipation).   Review of Systems  Constitutional: Positive for activity change. Negative for appetite change, chills, fatigue, fever and unexpected weight change.  HENT: Negative.   Eyes: Negative.   Respiratory: Negative.  Negative for cough, chest tightness and shortness of breath.   Cardiovascular: Negative.  Negative for chest pain, palpitations and leg swelling.  Gastrointestinal: Negative.  Negative for abdominal distention, abdominal pain, constipation, diarrhea, nausea and vomiting.  Musculoskeletal: Positive for arthralgias, back pain and myalgias. Negative for gait problem and joint swelling.  Skin: Negative.   Neurological: Negative.   Psychiatric/Behavioral: Negative.       Objective:   Physical Exam  Constitutional: She is oriented to person, place, and time. She appears well-developed and well-nourished.  HENT:  Head: Normocephalic and atraumatic.  Eyes: EOM are normal.  Neck: Normal range of motion.  Cardiovascular: Normal rate and regular rhythm.  Pulmonary/Chest: Effort normal. No respiratory distress. She  has no wheezes. She has no rales.  Stable lung exam with rhonchi  Abdominal: Soft. Bowel sounds are normal. She exhibits no distension. There is no tenderness. There is no rebound.  Musculoskeletal: She exhibits tenderness. She exhibits no edema.  Neurological: She is alert and oriented to person, place, and time. Coordination normal.  Skin: Skin is warm and dry.  Psychiatric: She has a normal mood and affect.   Vitals:   02/22/18 0827  BP: 130/80  Pulse: 84  Temp: 97.7 F (36.5 C)  TempSrc: Oral  SpO2: 96%  Weight: 129 lb (58.5 kg)  Height: 5' 2"  (1.575 m)      Assessment & Plan:

## 2018-02-22 NOTE — Patient Instructions (Signed)
We will refill the medicine in a couple of weeks when it is due. See you back in about 3 months.

## 2018-02-22 NOTE — Assessment & Plan Note (Signed)
[place] narcotic database reviewed and appropriate. Recent UDS appropriate but 1 inappropriate in the past. Contract on file. Reviewed risk and benefit of this medication and she is aware and elects to continue therapy.

## 2018-02-22 NOTE — Assessment & Plan Note (Signed)
[place] narcotic database reviewed and appropriate. Has had inappropriate UDS but most recent okay. Due at next visit. Refill hydrocodone when due on 03/12/18. Back in 3 months. Contract on file. She is aware of the risks and benefits and elects to continue with therapy.

## 2018-03-12 ENCOUNTER — Telehealth: Payer: Self-pay | Admitting: Internal Medicine

## 2018-03-12 NOTE — Telephone Encounter (Signed)
Copied from [place] [phone]. Topic: General - Other >> Mar 12, 2018 11:12 AM Lennox Solders wrote: Reason for CRM:pt needs refill on hydrocodone. Pt was told by pharm to call md office.  [place]

## 2018-03-12 NOTE — Telephone Encounter (Signed)
Control database checked last refill: 12/13/2017 LOV: 02/22/2018 for pain management  NOV: 05/24/2018

## 2018-03-13 MED ORDER — HYDROCODONE-ACETAMINOPHEN 5-325 MG PO TABS: 1.0000 | ORAL_TABLET | Freq: Two times a day (BID) | ORAL | 0 refills | Status: DC | PRN

## 2018-03-14 NOTE — Telephone Encounter (Signed)
Tried calling patient phone kept cutting out routing to [place] patient calls back

## 2018-03-14 NOTE — Telephone Encounter (Signed)
She should just pick up from pharmacy sent to if possible.

## 2018-03-14 NOTE — Telephone Encounter (Signed)
Pt called requesting that they hydrocodone that was approved on sent to [facility] [address] needs to be sent to [facility] [address]   [facility] and pt states she has never used [facility]

## 2018-03-14 NOTE — Telephone Encounter (Signed)
See note from pt. Re: Hydrocodone being sent to the wrong pharmacy; please send to [facility] on [address].

## 2018-03-15 NOTE — Telephone Encounter (Signed)
Pt called back in to follow up. I did advise per provider. Pt says that it's not okay and that she would like to have her Rx sent to the correct pharmacy, which is [facility] on [address]. Pt says that that is where her Rx card is set up at.

## 2018-03-15 NOTE — Telephone Encounter (Signed)
She should fill the medicine where it is at.

## 2018-04-15 ENCOUNTER — Other Ambulatory Visit: Payer: Self-pay | Admitting: Internal Medicine

## 2018-04-15 NOTE — Telephone Encounter (Signed)
Copied from [place] [phone]. Topic: Quick Communication - Rx Refill/Question >> Apr 15, 2018 10:29 AM Judyann Munson wrote: Medication: HYDROcodone-acetaminophen (NORCO/VICODIN) 5-325 MG tablet  Has the patient contacted their pharmacy? yes Preferred Pharmacy (with phone number or street name): [place], [place] [phone] (Phone) [phone] (Fax)    Agent: Please be advised that RX refills may take up to 3 business days. We ask that you follow-up with your pharmacy.

## 2018-04-16 MED ORDER — HYDROCODONE-ACETAMINOPHEN 5-325 MG PO TABS: 1.0000 | ORAL_TABLET | Freq: Two times a day (BID) | ORAL | 0 refills | Status: DC | PRN

## 2018-04-16 NOTE — Telephone Encounter (Signed)
Called pharmacy last refill 03/16/2018 with 60 tabs LOV: 02/22/2018 pain management  NOV: 05/24/2017

## 2018-04-17 NOTE — Progress Notes (Signed)
[name] [address], [place] [address] Phone: [phone] Subjective:    I Erica [name] am serving as a Education administrator for Dr. Hulan Saas.   CC: neck pain   [id number]  Erica [name] is a 68 y.o. female coming in with complaint of neck pain and back pain. States that she is not doing well.  Patient has severe neck pain and back pain.  Continues to have pain overall.  Has known degenerative disc disease with some nerve impingement.  Has responded somewhat to epidurals in the past.  Worsening right shoulder pain as well.  Rotator cuff arthropathy as well.  Discussed icing regimen at home exercise.    Past Medical History:  Diagnosis Date  . ADENOCARCINOMA, COLON, HX OF 03/2009  . ANEMIA   . BACK PAIN, LUMBAR   . Cancer [facility]) 2010   colon cancer  . CHF (congestive heart failure) ([place])   . COPD (chronic obstructive pulmonary disease) with emphysema ([place])   . Crohn's  02/2010   ileal ulcers, intol of entercort--refuses treatment  . Duodenitis 03/29/2017   Peptic  . GERD   . History of transfusion of whole blood   . HYPERLIPIDEMIA   . HYPERTENSION   . Microscopic hematuria    chronic  . Obstruction of intestine or colon ([facility])    adhesions  . OSTEOARTHRITIS   . Rectal fissure    Possible fissure  . Rotator cuff tear   . Takotsubo syndrome 12/2008  . URINARY INCONTINENCE    Past Surgical History:  Procedure Laterality Date  . ABDOMINAL HYSTERECTOMY  1994  . APPENDECTOMY  1964  . BLADDER SUSPENSION    . CESAREAN SECTION     x 3  . CHOLECYSTECTOMY  1995  . COLON SURGERY  03/2009   hemicolectomy colon cancer  . COLONOSCOPY W/ BIOPSIES AND POLYPECTOMY  01/24/2011   (Crohn's)ileitis, internal hemorrhoids  . ECTOPIC PREGNANCY SURGERY    . ESOPHAGOGASTRODUODENOSCOPY    . FACIAL COSMETIC SURGERY    . HAND SURGERY Bilateral 1991  . KNEE SURGERY Right 1981  . right knee replacement  03/2010  . supraorbital fracture surgery    .  TONSILLECTOMY  1952  . UPPER GASTROINTESTINAL ENDOSCOPY  05/16/2010   normal  . UTERINE SUSPENSION    . VIDEO BRONCHOSCOPY Bilateral 12/18/2014   Procedure: VIDEO BRONCHOSCOPY WITHOUT FLUORO;  Surgeon: Tanda Rockers, MD;  Location: [facility];  Service: Cardiopulmonary;  Laterality: Bilateral;   Social History   Socioeconomic History  . Marital status: Married    Spouse name: Not on file  . Number of children: Not on file  . Years of education: Not on file  . Highest education level: Not on file  Occupational History  . Occupation: Retired     Fish farm manager: UNEMPLOYED  Social Needs  . Financial resource strain: Not hard at all  . Food insecurity:    Worry: Never true    Inability: Never true  . Transportation needs:    Medical: No    Non-medical: No  Tobacco Use  . Smoking status: Current Every Day Smoker    Packs/day: 1.00    Years: 50.00    Pack years: 50.00    Types: Cigarettes  . Smokeless tobacco: Never Used  Substance and Sexual Activity  . Alcohol use: No    Alcohol/week: 0.0 standard drinks  . Drug use: No  . Sexual activity: Not on file  Lifestyle  . Physical activity:  Days per week: 7 days    Minutes per session: Patient refused  . Stress: Not at all  Relationships  . Social connections:    Talks on phone: More than three times a week    Gets together: More than three times a week    Attends religious service: More than 4 times per year    Active member of club or organization: Yes    Attends meetings of clubs or organizations: More than 4 times per year    Relationship status: Married  Other Topics Concern  . Not on file  Social History Narrative   Lives with husband and 2 grand dtr; 9 and 16   Allergies  Allergen Reactions  . Morphine Nausea And Vomiting   Family History  Problem Relation Age of Onset  . Colon cancer Father 50  . Hypertension Father   . Heart disease Father   . Kidney disease Father   . Colon cancer Sister 61  . Liver  cancer Sister   . Other Sister        amyloidosis  . Throat cancer Mother   . Arthritis Other        Parent, other relative     Current Outpatient Medications (Cardiovascular):  .  metoprolol tartrate (LOPRESSOR) 25 MG tablet, Take 1 tablet (25 mg total) by mouth 2 (two) times daily.  Current Outpatient Medications (Respiratory):  .  albuterol (PROVENTIL HFA;VENTOLIN HFA) 108 (90 Base) MCG/ACT inhaler, INHALE 2 PUFFS BY MOUTH EVERY 4 HOURS AS NEEDED FOR WHEEZING OR SHORTNESS OF BREATH .  albuterol (PROVENTIL) (2.5 MG/3ML) 0.083% nebulizer solution, Take 3 mLs (2.5 mg total) by nebulization every 6 (six) hours as needed for wheezing or shortness of breath.  Current Outpatient Medications (Analgesics):  .  acetaminophen (TYLENOL) 500 MG tablet, Take 1,000 mg by mouth every 6 (six) hours as needed for mild pain, moderate pain or headache. [name]  HYDROcodone-acetaminophen (NORCO/VICODIN) 5-325 MG tablet, Take 1 tablet by mouth 2 (two) times daily as needed for moderate pain (pain).   Current Outpatient Medications (Other):  .  cyclobenzaprine (FLEXERIL) 10 MG tablet, Take 1 tablet (10 mg total) by mouth 3 (three) times daily as needed for muscle spasms.    Past medical history, social, surgical and family history all reviewed in electronic medical record.  No pertanent information unless stated regarding to the chief complaint.   Review of Systems:  No headache, visual changes, nausea, vomiting, diarrhea, constipation, dizziness, abdominal pain, skin rash, fevers, chills, night sweats, weight loss, swollen lymph nodes, body aches, joint swelling, chest pain, shortness of breath, mood changes.  Muscle aches  Objective  Blood pressure 140/80, pulse 82, height 5' 2"  (1.575 m), weight 129 lb (58.5 kg), SpO2 93 %.    General: No apparent distress alert and oriented x3 mood and affect normal, dressed appropriately.  HEENT: Pupils equal, extraocular movements intact  Respiratory: Patient's  speak in full sentences and does not appear short of breath  Cardiovascular: No lower extremity edema, non tender, no erythema  Skin: Warm dry intact with no signs of infection or rash on extremities or on axial skeleton.  Abdomen: Soft nontender  Neuro: Cranial nerves II through XII are intact, neurovascularly intact in all extremities with 2+ DTRs and 2+ pulses.  Lymph: No lymphadenopathy of posterior or anterior cervical chain or axillae bilaterally.  Gait antalgic MSK:  tender with full range of motion and good stability and symmetric strength and tone of , elbows, wrist, hip,  knee and ankles bilaterally. Neck: Inspection loss of lordosis. No palpable stepoffs. Positive Spurling's maneuver. Limited range of motion. Grip strength and sensation normal in bilateral hands Strength good C4 to T1 distribution No sensory change to C4 to T1 Negative Hoffman sign bilaterally Reflexes normal Tightness of the trapezius bilaterally.   Back Exam:  Inspection: loss of lordosis mild degenerative scoliosis Motion: Flexion 30 deg, Extension 15 deg, Side Bending to 25 deg bilaterally, Rotation to 35 deg bilaterally  SLR laying: Negative  XSLR laying: Negative  Palpable tenderness: Tender to palpation diffusely. FABER: tightness bilaterally . Sensory change: Gross sensation intact to all lumbar and sacral dermatomes.  Reflexes: 2+ at both patellar tendons, 2+ at achilles tendons, Babinski's downgoing.  Strength at foot  Plantar-flexion: 5/5 Dorsi-flexion: 5/5 Eversion: 5/5 Inversion: 5/5  Leg strength  Quad: 5/5 Hamstring: 5/5 Hip flexor: 5/5 Hip abductors: 5/5  Gait unremarkable.  After informed written and verbal consent, patient was seated on exam table. Right shoulder was prepped with alcohol swab and utilizing posterior approach, patient's right glenohumeral space was injected with 4:1  marcaine 0.5%: Kenalog 29m/dL. Patient tolerated the procedure well without immediate complications.     Impression and Recommendations:     This case required medical decision making of moderate complexity. The above documentation has been reviewed and is accurate and complete ZLyndal Pulley DO       Note: This dictation was prepared with Dragon dictation along with smaller phrase technology. Any transcriptional errors that result from this process are unintentional.

## 2018-04-18 ENCOUNTER — Encounter: Payer: Self-pay | Admitting: Family Medicine

## 2018-04-18 ENCOUNTER — Ambulatory Visit (INDEPENDENT_AMBULATORY_CARE_PROVIDER_SITE_OTHER): Payer: Medicare Other | Admitting: Family Medicine

## 2018-04-18 ENCOUNTER — Other Ambulatory Visit (INDEPENDENT_AMBULATORY_CARE_PROVIDER_SITE_OTHER): Payer: Medicare Other

## 2018-04-18 VITALS — BP 140/80 | HR 82 | Ht 62.0 in | Wt 129.0 lb

## 2018-04-18 DIAGNOSIS — R634 Abnormal weight loss: Secondary | ICD-10-CM

## 2018-04-18 DIAGNOSIS — R7989 Other specified abnormal findings of blood chemistry: Secondary | ICD-10-CM

## 2018-04-18 DIAGNOSIS — M12811 Other specific arthropathies, not elsewhere classified, right shoulder: Secondary | ICD-10-CM

## 2018-04-18 DIAGNOSIS — M5412 Radiculopathy, cervical region: Secondary | ICD-10-CM

## 2018-04-18 DIAGNOSIS — F1120 Opioid dependence, uncomplicated: Secondary | ICD-10-CM

## 2018-04-18 DIAGNOSIS — M2550 Pain in unspecified joint: Secondary | ICD-10-CM

## 2018-04-18 DIAGNOSIS — M5010 Cervical disc disorder with radiculopathy, unspecified cervical region: Secondary | ICD-10-CM

## 2018-04-18 LAB — TSH: TSH: 0.57 u[IU]/mL (ref 0.35–4.50)

## 2018-04-18 LAB — T3, FREE: T3, Free: 2.6 pg/mL (ref 2.3–4.2)

## 2018-04-18 LAB — IBC PANEL
Iron: 65 ug/dL (ref 42–145)
Saturation Ratios: 22.5 % (ref 20.0–50.0)
Transferrin: 206 mg/dL — ABNORMAL LOW (ref 212.0–360.0)

## 2018-04-18 LAB — T4, FREE: Free T4: 0.98 ng/dL (ref 0.60–1.60)

## 2018-04-18 LAB — FERRITIN: Ferritin: 53 ng/mL (ref 10.0–291.0)

## 2018-04-18 LAB — SEDIMENTATION RATE: Sed Rate: 19 mm/hr (ref 0–30)

## 2018-04-18 NOTE — Assessment & Plan Note (Signed)
Continues to get refills from primary care

## 2018-04-18 NOTE — Assessment & Plan Note (Signed)
Patient given injection today.  Tolerated the procedure well.  Discussed icing regimen and home exercise.  Laboratory work-up ordered today to further evaluate for patient's possible increasing pain and weakness.  Follow-up with me again 10 weeks

## 2018-04-18 NOTE — Assessment & Plan Note (Signed)
Significant weakness on the right side in the C6 distribution.  MRIs from 2017 independently visualized by me and has had a nerve impingement.  Patient did respond initially to some epidurals but now no longer.  Patient does have rotator cuff arthropathy an injection given into the right shoulder to see how much that is playing a role.  Patient had abnormal thyroid studies and we will recheck to see if these are improving.  Patient did initially have weight loss.  If labs are normal and injection in the shoulder does not seem to make significant improvement encourage patient to follow-up with neurosurgery to discuss other options for patient's pain, numbness and weakness.

## 2018-04-18 NOTE — Patient Instructions (Signed)
I am so sorry you are hurting.  Ice is your friend Stay active I think it is worth you seeing neurosurgery just to discuss the neck  They may want new images  See me again in 10 weeks otherwise

## 2018-04-24 ENCOUNTER — Inpatient Hospital Stay (HOSPITAL_COMMUNITY): Payer: Medicare Other

## 2018-04-24 ENCOUNTER — Inpatient Hospital Stay (HOSPITAL_COMMUNITY)
Admission: EM | Admit: 2018-04-24 | Discharge: 2018-04-28 | DRG: 386 | Disposition: A | Discharge status: Home / Self Care | Payer: Medicare Other | Attending: Internal Medicine | Admitting: Internal Medicine

## 2018-04-24 ENCOUNTER — Encounter (HOSPITAL_COMMUNITY): Payer: Self-pay

## 2018-04-24 ENCOUNTER — Emergency Department (HOSPITAL_COMMUNITY): Payer: Medicare Other

## 2018-04-24 ENCOUNTER — Other Ambulatory Visit: Payer: Self-pay

## 2018-04-24 DIAGNOSIS — Z79891 Long term (current) use of opiate analgesic: Secondary | ICD-10-CM | POA: Diagnosis not present

## 2018-04-24 DIAGNOSIS — K5652 Intestinal adhesions [bands] with complete obstruction: Secondary | ICD-10-CM | POA: Diagnosis not present

## 2018-04-24 DIAGNOSIS — I5032 Chronic diastolic (congestive) heart failure: Secondary | ICD-10-CM | POA: Diagnosis not present

## 2018-04-24 DIAGNOSIS — Z85038 Personal history of other malignant neoplasm of large intestine: Secondary | ICD-10-CM | POA: Diagnosis present

## 2018-04-24 DIAGNOSIS — E279 Disorder of adrenal gland, unspecified: Secondary | ICD-10-CM | POA: Diagnosis not present

## 2018-04-24 DIAGNOSIS — J439 Emphysema, unspecified: Secondary | ICD-10-CM | POA: Diagnosis present

## 2018-04-24 DIAGNOSIS — Z800 Family history of malignant neoplasm of digestive organs: Secondary | ICD-10-CM

## 2018-04-24 DIAGNOSIS — K565 Intestinal adhesions [bands], unspecified as to partial versus complete obstruction: Secondary | ICD-10-CM | POA: Diagnosis present

## 2018-04-24 DIAGNOSIS — K56609 Unspecified intestinal obstruction, unspecified as to partial versus complete obstruction: Secondary | ICD-10-CM | POA: Diagnosis not present

## 2018-04-24 DIAGNOSIS — G8929 Other chronic pain: Secondary | ICD-10-CM | POA: Diagnosis present

## 2018-04-24 DIAGNOSIS — J449 Chronic obstructive pulmonary disease, unspecified: Secondary | ICD-10-CM | POA: Diagnosis present

## 2018-04-24 DIAGNOSIS — Z90710 Acquired absence of both cervix and uterus: Secondary | ICD-10-CM | POA: Diagnosis not present

## 2018-04-24 DIAGNOSIS — Z808 Family history of malignant neoplasm of other organs or systems: Secondary | ICD-10-CM | POA: Diagnosis not present

## 2018-04-24 DIAGNOSIS — Z8249 Family history of ischemic heart disease and other diseases of the circulatory system: Secondary | ICD-10-CM

## 2018-04-24 DIAGNOSIS — K56600 Partial intestinal obstruction, unspecified as to cause: Secondary | ICD-10-CM | POA: Diagnosis not present

## 2018-04-24 DIAGNOSIS — F17210 Nicotine dependence, cigarettes, uncomplicated: Secondary | ICD-10-CM | POA: Diagnosis present

## 2018-04-24 DIAGNOSIS — Z4682 Encounter for fitting and adjustment of non-vascular catheter: Secondary | ICD-10-CM | POA: Diagnosis not present

## 2018-04-24 DIAGNOSIS — Z9049 Acquired absence of other specified parts of digestive tract: Secondary | ICD-10-CM | POA: Diagnosis not present

## 2018-04-24 DIAGNOSIS — K5090 Crohn's disease, unspecified, without complications: Secondary | ICD-10-CM | POA: Diagnosis present

## 2018-04-24 DIAGNOSIS — K2980 Duodenitis without bleeding: Secondary | ICD-10-CM

## 2018-04-24 DIAGNOSIS — Z885 Allergy status to narcotic agent status: Secondary | ICD-10-CM | POA: Diagnosis not present

## 2018-04-24 DIAGNOSIS — K219 Gastro-esophageal reflux disease without esophagitis: Secondary | ICD-10-CM | POA: Diagnosis not present

## 2018-04-24 DIAGNOSIS — Z96651 Presence of right artificial knee joint: Secondary | ICD-10-CM | POA: Diagnosis present

## 2018-04-24 DIAGNOSIS — K50012 Crohn's disease of small intestine with intestinal obstruction: Principal | ICD-10-CM

## 2018-04-24 DIAGNOSIS — F1120 Opioid dependence, uncomplicated: Secondary | ICD-10-CM | POA: Diagnosis not present

## 2018-04-24 DIAGNOSIS — J441 Chronic obstructive pulmonary disease with (acute) exacerbation: Secondary | ICD-10-CM | POA: Diagnosis present

## 2018-04-24 DIAGNOSIS — I10 Essential (primary) hypertension: Secondary | ICD-10-CM | POA: Diagnosis not present

## 2018-04-24 DIAGNOSIS — R61 Generalized hyperhidrosis: Secondary | ICD-10-CM | POA: Diagnosis not present

## 2018-04-24 DIAGNOSIS — E785 Hyperlipidemia, unspecified: Secondary | ICD-10-CM | POA: Diagnosis present

## 2018-04-24 DIAGNOSIS — E278 Other specified disorders of adrenal gland: Secondary | ICD-10-CM

## 2018-04-24 DIAGNOSIS — Z720 Tobacco use: Secondary | ICD-10-CM | POA: Diagnosis present

## 2018-04-24 DIAGNOSIS — Z79899 Other long term (current) drug therapy: Secondary | ICD-10-CM

## 2018-04-24 DIAGNOSIS — J410 Simple chronic bronchitis: Secondary | ICD-10-CM | POA: Diagnosis present

## 2018-04-24 DIAGNOSIS — I110 Hypertensive heart disease with heart failure: Secondary | ICD-10-CM | POA: Diagnosis not present

## 2018-04-24 DIAGNOSIS — C182 Malignant neoplasm of ascending colon: Secondary | ICD-10-CM | POA: Diagnosis not present

## 2018-04-24 DIAGNOSIS — Z841 Family history of disorders of kidney and ureter: Secondary | ICD-10-CM

## 2018-04-24 DIAGNOSIS — Z0189 Encounter for other specified special examinations: Secondary | ICD-10-CM

## 2018-04-24 DIAGNOSIS — R109 Unspecified abdominal pain: Secondary | ICD-10-CM | POA: Diagnosis not present

## 2018-04-24 DIAGNOSIS — F17200 Nicotine dependence, unspecified, uncomplicated: Secondary | ICD-10-CM | POA: Diagnosis present

## 2018-04-24 HISTORY — DX: Complete rotator cuff tear or rupture of left shoulder, not specified as traumatic: M75.122

## 2018-04-24 HISTORY — DX: Chronic obstructive pulmonary disease with (acute) exacerbation: J44.1

## 2018-04-24 HISTORY — DX: Duodenitis without bleeding: K29.80

## 2018-04-24 HISTORY — DX: Malignant neoplasm of ascending colon: C18.2

## 2018-04-24 LAB — LIPASE, BLOOD: Lipase: 33 U/L (ref 11–51)

## 2018-04-24 LAB — URINALYSIS, ROUTINE W REFLEX MICROSCOPIC
Bacteria, UA: NONE SEEN
Bilirubin Urine: NEGATIVE
Glucose, UA: NEGATIVE mg/dL
Ketones, ur: NEGATIVE mg/dL
Leukocytes, UA: NEGATIVE
Nitrite: NEGATIVE
Protein, ur: NEGATIVE mg/dL
Specific Gravity, Urine: >1.046 — ABNORMAL HIGH (ref 1.005–1.030)
pH: 5 (ref 5.0–8.0)

## 2018-04-24 LAB — CBC
HCT: 45.1 % (ref 36.0–46.0)
Hemoglobin: 14.5 g/dL (ref 12.0–15.0)
MCH: 30.7 pg (ref 26.0–34.0)
MCHC: 32.2 g/dL (ref 30.0–36.0)
MCV: 95.6 fL (ref 80.0–100.0)
Platelets: 359 10*3/uL (ref 150–400)
RBC: 4.72 MIL/uL (ref 3.87–5.11)
RDW: 13.1 % (ref 11.5–15.5)
WBC: 13.5 10*3/uL — ABNORMAL HIGH (ref 4.0–10.5)
nRBC: 0 % (ref 0.0–0.2)

## 2018-04-24 LAB — COMPREHENSIVE METABOLIC PANEL
ALT: 14 U/L (ref 0–44)
AST: 17 U/L (ref 15–41)
Albumin: 4.5 g/dL (ref 3.5–5.0)
Alkaline Phosphatase: 59 U/L (ref 38–126)
Anion gap: 8 (ref 5–15)
BUN: 18 mg/dL (ref 8–23)
CO2: 30 mmol/L (ref 22–32)
Calcium: 9.7 mg/dL (ref 8.9–10.3)
Chloride: 100 mmol/L (ref 98–111)
Creatinine, Ser: 0.76 mg/dL (ref 0.44–1.00)
GFR calc Af Amer: >60 mL/min (ref >60)
GFR calc non Af Amer: >60 mL/min (ref >60)
Glucose, Bld: 103 mg/dL — ABNORMAL HIGH (ref 70–99)
Potassium: 4.6 mmol/L (ref 3.5–5.1)
Sodium: 138 mmol/L (ref 135–145)
Total Bilirubin: 1 mg/dL (ref 0.3–1.2)
Total Protein: 7.1 g/dL (ref 6.5–8.1)

## 2018-04-24 LAB — LACTIC ACID, PLASMA: Lactic Acid, Venous: 1.2 mmol/L (ref 0.5–1.9)

## 2018-04-24 MED ORDER — METHOCARBAMOL 1000 MG/10ML IJ SOLN
1000.0000 mg | Freq: Four times a day (QID) | INTRAVENOUS | Status: DC | PRN
  Administered 2018-04-24: 1000 mg via INTRAVENOUS
  Filled 2018-04-24 (×2): qty 10

## 2018-04-24 MED ORDER — HYDROCORTISONE 2.5 % RE CREA: 1.0000 "application " | TOPICAL_CREAM | Freq: Four times a day (QID) | RECTAL | Status: DC | PRN

## 2018-04-24 MED ORDER — PHENOL 1.4 % MT LIQD: 1.0000 | OROMUCOSAL | Status: DC | PRN

## 2018-04-24 MED ORDER — MAGIC MOUTHWASH
15.0000 mL | Freq: Four times a day (QID) | ORAL | Status: DC | PRN
  Filled 2018-04-24: qty 15

## 2018-04-24 MED ORDER — GUAIFENESIN-DM 100-10 MG/5ML PO SYRP: 10.0000 mL | ORAL_SOLUTION | ORAL | Status: DC | PRN

## 2018-04-24 MED ORDER — MENTHOL 3 MG MT LOZG
1.0000 | LOZENGE | OROMUCOSAL | Status: DC | PRN
  Administered 2018-04-26: 3 mg via ORAL
  Filled 2018-04-24 (×2): qty 9

## 2018-04-24 MED ORDER — ONDANSETRON HCL 4 MG/2ML IJ SOLN
4.0000 mg | Freq: Once | INTRAMUSCULAR | Status: AC
  Administered 2018-04-24: 4 mg via INTRAVENOUS
  Filled 2018-04-24: qty 2

## 2018-04-24 MED ORDER — HYDROMORPHONE HCL 1 MG/ML IJ SOLN
1.0000 mg | Freq: Once | INTRAMUSCULAR | Status: AC
  Administered 2018-04-24: 1 mg via INTRAVENOUS
  Filled 2018-04-24: qty 1

## 2018-04-24 MED ORDER — SODIUM CHLORIDE (PF) 0.9 % IJ SOLN
INTRAMUSCULAR | Status: AC
  Filled 2018-04-24: qty 50

## 2018-04-24 MED ORDER — LACTATED RINGERS IV BOLUS: 1000.0000 mL | Freq: Once | INTRAVENOUS | Status: DC

## 2018-04-24 MED ORDER — ALBUTEROL SULFATE (2.5 MG/3ML) 0.083% IN NEBU: 2.5000 mg | INHALATION_SOLUTION | RESPIRATORY_TRACT | Status: DC | PRN

## 2018-04-24 MED ORDER — ACETAMINOPHEN 325 MG PO TABS: 650.0000 mg | ORAL_TABLET | Freq: Four times a day (QID) | ORAL | Status: DC | PRN

## 2018-04-24 MED ORDER — PANTOPRAZOLE SODIUM 40 MG IV SOLR
40.0000 mg | Freq: Once | INTRAVENOUS | Status: AC
  Administered 2018-04-24: 40 mg via INTRAVENOUS
  Filled 2018-04-24: qty 40

## 2018-04-24 MED ORDER — DIPHENHYDRAMINE HCL 50 MG/ML IJ SOLN: 12.5000 mg | Freq: Four times a day (QID) | INTRAMUSCULAR | Status: DC | PRN

## 2018-04-24 MED ORDER — ONDANSETRON HCL 4 MG/2ML IJ SOLN: 4.0000 mg | Freq: Four times a day (QID) | INTRAMUSCULAR | Status: DC | PRN

## 2018-04-24 MED ORDER — SODIUM CHLORIDE 0.9% FLUSH
3.0000 mL | Freq: Once | INTRAVENOUS | Status: AC
  Administered 2018-04-24: 3 mL via INTRAVENOUS

## 2018-04-24 MED ORDER — LIDOCAINE HCL URETHRAL/MUCOSAL 2 % EX GEL
1.0000 "application " | Freq: Once | CUTANEOUS | Status: DC
  Filled 2018-04-24: qty 5

## 2018-04-24 MED ORDER — SODIUM CHLORIDE 0.9 % IV SOLN
8.0000 mg | Freq: Four times a day (QID) | INTRAVENOUS | Status: DC | PRN
  Filled 2018-04-24: qty 4

## 2018-04-24 MED ORDER — LACTATED RINGERS IV BOLUS
500.0000 mL | Freq: Once | INTRAVENOUS | Status: AC
  Administered 2018-04-24: 500 mL via INTRAVENOUS

## 2018-04-24 MED ORDER — ALUM & MAG HYDROXIDE-SIMETH 200-200-20 MG/5ML PO SUSP: 30.0000 mL | Freq: Four times a day (QID) | ORAL | Status: DC | PRN

## 2018-04-24 MED ORDER — PROCHLORPERAZINE EDISYLATE 10 MG/2ML IJ SOLN: 5.0000 mg | INTRAMUSCULAR | Status: DC | PRN

## 2018-04-24 MED ORDER — IOPAMIDOL (ISOVUE-300) INJECTION 61%
INTRAVENOUS | Status: AC
  Filled 2018-04-24: qty 100

## 2018-04-24 MED ORDER — ALBUTEROL SULFATE (2.5 MG/3ML) 0.083% IN NEBU: 2.5000 mg | INHALATION_SOLUTION | Freq: Four times a day (QID) | RESPIRATORY_TRACT | Status: DC | PRN

## 2018-04-24 MED ORDER — HYDROCORTISONE 1 % EX CREA: 1.0000 "application " | TOPICAL_CREAM | Freq: Three times a day (TID) | CUTANEOUS | Status: DC | PRN

## 2018-04-24 MED ORDER — LIP MEDEX EX OINT
1.0000 "application " | TOPICAL_OINTMENT | Freq: Two times a day (BID) | CUTANEOUS | Status: DC
  Administered 2018-04-24 – 2018-04-28 (×8): 1 via TOPICAL
  Filled 2018-04-24 (×2): qty 7

## 2018-04-24 MED ORDER — SODIUM CHLORIDE 0.9 % IV SOLN
INTRAVENOUS | Status: DC
  Administered 2018-04-24: 22:00:00 via INTRAVENOUS

## 2018-04-24 MED ORDER — BISACODYL 10 MG RE SUPP: 10.0000 mg | Freq: Two times a day (BID) | RECTAL | Status: DC | PRN

## 2018-04-24 MED ORDER — LACTATED RINGERS IV SOLN
INTRAVENOUS | Status: DC
  Administered 2018-04-25 – 2018-04-28 (×5): via INTRAVENOUS

## 2018-04-24 MED ORDER — DIATRIZOATE MEGLUMINE & SODIUM 66-10 % PO SOLN
90.0000 mL | Freq: Once | ORAL | Status: AC
  Administered 2018-04-24: 90 mL via NASOGASTRIC
  Filled 2018-04-24: qty 90

## 2018-04-24 MED ORDER — ACETAMINOPHEN 650 MG RE SUPP: 650.0000 mg | Freq: Four times a day (QID) | RECTAL | Status: DC | PRN

## 2018-04-24 MED ORDER — ONDANSETRON HCL 4 MG PO TABS: 4.0000 mg | ORAL_TABLET | Freq: Four times a day (QID) | ORAL | Status: DC | PRN

## 2018-04-24 MED ORDER — IOPAMIDOL (ISOVUE-300) INJECTION 61%
100.0000 mL | Freq: Once | INTRAVENOUS | Status: AC | PRN
  Administered 2018-04-24: 100 mL via INTRAVENOUS

## 2018-04-24 NOTE — H&P (Signed)
Erica [name] YFV:[id number] DOB: 01-May-1950 DOA: 04/24/2018     PCP: [name], MD   Outpatient Specialists:  CARDS:  Dr. [name]   Pulmonary  Jerrell Belfast NP   GI  Dr.  Carlean Purl    Patient arrived to ER on 04/24/18 at 1609  Patient coming from: home Lives  With family    Chief Complaint:  Chief Complaint  Patient presents with  . Abdominal Pain    HPI: Erica [name] is a 68 y.o. female with medical history significant of colon cancer, anemia, back pain, CHF, COPD, Crohn's disease GERD, HLD, HTN, Takotsubo syndrome In the past  Presented with abdominal pain normal bowel movement this a.m. no associated nausea vomiting pain started midday.  Feels similar to prior small bowel obstruction.  CVA and cramping like. She has history of COPD and continues to smoke has had some extra coughing recently.  Regarding pertinent Chronic problems: Continues on inhalers continues to smoke Chronic pain on Vicodin at home on Flexeril History of hypertension on metoprolol History of colon cancer is post colon surgery hemicolectomy History of Takotsubo cardiomyopathy 1638 chronic diastolic CHF last echogram done in 2015 showing improved cardiac EF from 45 up to 60 with grade 1 diastolic dysfunction  While in ER: Started to have nausea and vomiting after arrival the following Work up has been ordered so far:  Orders Placed This Encounter  Procedures  . CT Abdomen Pelvis W Contrast  . DG Abd Portable 1V-Small Bowel Protocol-Position Verification  . DG Abd Portable 1V-Small Bowel Obstruction Protocol-initial, 8 hr delay  . Lipase, blood  . Comprehensive metabolic panel  . CBC  . Urinalysis, Routine w reflex microscopic  . Basic metabolic panel  . CBC  . Diet NPO time specified Except for: Ice Chips  . Saline Lock IV, Maintain IV access  . Insert NG/OG (Gastric Tube)  . Refer to Sidebar: Small Bowel Obstruction Protocol  . Gastric tube  . Elevate head of bed  . Ensure  patient not vomiting prior to administration of Gastrografin. Notify MD if patient is actively vomiting  . Clamp NG tube for 1 hour after administration of Gastrografin then resume NG tube to low wall intermittent suction  . Gastric tube  . Flush NG/OG  . Ice chips  . Apply vaseline to lips  . Diet instructions to nursing: Do not administer enteral / PO medications until the patient is tolerating a full liquid diet.  Do not administer enteral medications if the patient has a nasogastric tube.  Patient does not have normal bowel function...  . In and Out Cath  . Strict intake and output  . Apply ice to affected area  . K pad  . Shower with wound open  . Up in chair  . Ambulate in hall  . Walker rolling  . Elevate head of bed  . Care order/instruction: Pharmacy may adjust medication dosing, strength, schedule, rate of infusion, etc as needed to optimize therapy  . Consult to general surgery  . Consult to hospitalist    Following Medications were ordered in ER: Medications  lactated ringers infusion (has no administration in time range)  iopamidol (ISOVUE-300) 61 % injection (has no administration in time range)  sodium chloride (PF) 0.9 % injection (has no administration in time range)  diatrizoate meglumine-sodium (GASTROGRAFIN) 66-10 % solution 90 mL (has no administration in time range)  lactated ringers bolus 1,000 mL (has no administration in time range)  methocarbamol (ROBAXIN) 1,000 mg in dextrose  5 % 50 mL IVPB (has no administration in time range)  ondansetron (ZOFRAN) injection 4 mg (has no administration in time range)    Or  ondansetron (ZOFRAN) 8 mg in sodium chloride 0.9 % 50 mL IVPB (has no administration in time range)  prochlorperazine (COMPAZINE) injection 5-10 mg (has no administration in time range)  lip balm (CARMEX) ointment 1 application (has no administration in time range)  magic mouthwash (has no administration in time range)  bisacodyl (DULCOLAX) suppository  10 mg (has no administration in time range)  guaiFENesin-dextromethorphan (ROBITUSSIN DM) 100-10 MG/5ML syrup 10 mL (has no administration in time range)  hydrocortisone (ANUSOL-HC) 2.5 % rectal cream 1 application (has no administration in time range)  alum & mag hydroxide-simeth (MAALOX/MYLANTA) 200-200-20 MG/5ML suspension 30 mL (has no administration in time range)  hydrocortisone cream 1 % 1 application (has no administration in time range)  menthol-cetylpyridinium (CEPACOL) lozenge 3 mg (has no administration in time range)  phenol (CHLORASEPTIC) mouth spray 1-2 spray (has no administration in time range)  diphenhydrAMINE (BENADRYL) injection 12.5-25 mg (has no administration in time range)  sodium chloride flush (NS) 0.9 % injection 3 mL (3 mLs Intravenous Given 04/24/18 1740)  HYDROmorphone (DILAUDID) injection 1 mg (1 mg Intravenous Given 04/24/18 1819)  ondansetron (ZOFRAN) injection 4 mg (4 mg Intravenous Given 04/24/18 1816)  lactated ringers bolus 500 mL (500 mLs Intravenous New Bag/Given (Non-Interop) 04/24/18 1816)  pantoprazole (PROTONIX) injection 40 mg (40 mg Intravenous Given 04/24/18 1817)  iopamidol (ISOVUE-300) 61 % injection 100 mL (100 mLs Intravenous Contrast Given 04/24/18 1831)  HYDROmorphone (DILAUDID) injection 1 mg (1 mg Intravenous Given 04/24/18 2002)  ondansetron (ZOFRAN) injection 4 mg (4 mg Intravenous Given 04/24/18 2001)    Significant initial  Findings: Abnormal Labs Reviewed  COMPREHENSIVE METABOLIC PANEL - Abnormal; Notable for the following components:      Result Value   Glucose, Bld 103 (*)    All other components within normal limits  CBC - Abnormal; Notable for the following components:   WBC 13.5 (*)    All other components within normal limits     Lactic Acid, Venous    Component Value Date/Time   LATICACIDVEN 1.2 04/24/2018 1755    Na 138 K 4.6  Cr   Stable  Lab Results  Component Value Date   CREATININE 0.76 04/24/2018   CREATININE  0.55 11/15/2017   CREATININE 0.72 11/14/2017    lipase 33  WBC 13.5  HG/HCT  stable,      Component Value Date/Time   HGB 14.5 04/24/2018 1646   HCT 45.1 04/24/2018 1646    Troponin (Point of Care Test) No results for input(s): TROPIPOC in the last 72 hours.   BNP (last 3 results) Recent Labs    11/14/17 1715  BNP 18.1    ProBNP (last 3 results) No results for input(s): PROBNP in the last 8760 hours.    UA  not ordered    CTabd/pelvis - SBO transition in the right lower quadrant lesions likely  ECG: not obtained     ED Triage Vitals  Enc Vitals Group     BP 04/24/18 1630 140/80     Pulse Rate 04/24/18 1630 92     Resp 04/24/18 1630 20     Temp 04/24/18 1630 98.2 F (36.8 C)     Temp Source 04/24/18 1630 Oral     SpO2 04/24/18 1630 96 %     Weight 04/24/18 1638 128 lb 15.5 oz (58.5  kg)     Height 04/24/18 1638 5' 2"  (1.575 m)     Head Circumference --      Peak Flow --      Pain Score 04/24/18 1637 6     Pain Loc --      Pain Edu? --      Excl. in [place]? --   TMAX(24)@       Latest  Blood pressure 133/78, pulse 76, temperature 98.2 F (36.8 C), temperature source Oral, resp. rate 20, height 5' 2"  (1.575 m), weight 58.5 kg, SpO2 99 %.   ER Provider Called: General surgery   Dr.Gross  They Recommend admit to medicine NG tube if able   Will see in AM or  in ER  Hospitalist was called for admission for SBO   Review of Systems:    Pertinent positives include:  abdominal pain, nausea, vomiting,  non-productive cough Constitutional:  No weight loss, night sweats, Fevers, chills, fatigue, weight loss  HEENT:  No headaches, Difficulty swallowing,Tooth/dental problems,Sore throat,  No sneezing, itching, ear ache, nasal congestion, post nasal drip,  Cardio-vascular:  No chest pain, Orthopnea, PND, anasarca, dizziness, palpitations.no Bilateral lower extremity swelling  GI:  No heartburn, indigestion diarrhea, change in bowel habits, loss of appetite,  melena, blood in stool, hematemesis Resp:  no shortness of breath at rest. No dyspnea on exertion, No excess mucus, no productive cough, No, No coughing up of blood.No change in color of mucus.No wheezing. Skin:  no rash or lesions. No jaundice GU:  no dysuria, change in color of urine, no urgency or frequency. No straining to urinate.  No flank pain.  Musculoskeletal:  No joint pain or no joint swelling. No decreased range of motion. No back pain.  Psych:  No change in mood or affect. No depression or anxiety. No memory loss.  Neuro: no localizing neurological complaints, no tingling, no weakness, no double vision, no gait abnormality, no slurred speech, no confusion  All systems reviewed and apart from [place] all are negative  Past Medical History:   Past Medical History:  Diagnosis Date  . Acute duodenitis 04/24/2017  . ADENOCARCINOMA, COLON, HX OF 03/2009  . ANEMIA   . BACK PAIN, LUMBAR   . Cancer [facility]) 2010   colon cancer  . Cancer of ascending colon [id number] s/p colectomy 03/05/2009 02/18/2010   Qualifier: Diagnosis of  By: Carlean Purl MD, Dimas Millin   . CHF (congestive heart failure) ([place])   . COPD (chronic obstructive pulmonary disease) with emphysema ([place])   . COPD exacerbation ([place]) 11/14/2017  . Crohn's  02/2010   ileal ulcers, intol of entercort--refuses treatment  . Duodenitis 03/29/2017   Peptic  . GERD   . History of transfusion of whole blood   . HYPERLIPIDEMIA   . HYPERTENSION   . Microscopic hematuria    chronic  . Obstruction of intestine or colon (HCC)    adhesions  . OSTEOARTHRITIS   . Rectal fissure    Possible fissure  . Rotator cuff tear   . Takotsubo syndrome 12/2008  . URINARY INCONTINENCE       Past Surgical History:  Procedure Laterality Date  . ABDOMINAL HYSTERECTOMY  1994  . APPENDECTOMY  1964  . BLADDER SUSPENSION    . CESAREAN SECTION     x 3  . CHOLECYSTECTOMY  1995  . COLONOSCOPY W/ BIOPSIES AND POLYPECTOMY  01/24/2011    (Crohn's)ileitis, internal hemorrhoids  . ECTOPIC PREGNANCY SURGERY    . ESOPHAGOGASTRODUODENOSCOPY    .  FACIAL COSMETIC SURGERY    . HAND SURGERY Bilateral 1991  . KNEE SURGERY Right 1981  . LYSIS OF ADHESION  2010   ex lap/LOA for SBO Dr Excell Seltzer  . ORBITAL FRACTURE SURGERY    . RIGHT COLECTOMY  03/2009   Right colectomy for colon CA.  Dr Donne Hazel  . TONSILLECTOMY  1952  . TOTAL KNEE ARTHROPLASTY  03/2010   Dr Alvan Dame.  Depuy  . UPPER GASTROINTESTINAL ENDOSCOPY  05/16/2010   normal  . UTERINE SUSPENSION    . VIDEO BRONCHOSCOPY Bilateral 12/18/2014   Procedure: VIDEO BRONCHOSCOPY WITHOUT FLUORO;  Surgeon: Tanda Rockers, MD;  Location: [facility];  Service: Cardiopulmonary;  Laterality: Bilateral;    Social History:  Ambulatory   independently      reports that she has been smoking cigarettes. She has a 50.00 pack-year smoking history. She has never used smokeless tobacco. She reports that she does not drink alcohol or use drugs.     Family History:   Family History  Problem Relation Age of Onset  . Colon cancer Father 39  . Hypertension Father   . Heart disease Father   . Kidney disease Father   . Colon cancer Sister 44  . Liver cancer Sister   . Other Sister        amyloidosis  . Throat cancer Mother   . Arthritis Other        Parent, other relative    Allergies: Allergies  Allergen Reactions  . Morphine Nausea And Vomiting     Prior to Admission medications   Medication Sig Start Date End Date Taking? Authorizing Provider  albuterol (PROVENTIL HFA;VENTOLIN HFA) 108 (90 Base) MCG/ACT inhaler INHALE 2 PUFFS BY MOUTH EVERY 4 HOURS AS NEEDED FOR WHEEZING OR SHORTNESS OF BREATH 12/05/17  Yes [name], MD  albuterol (PROVENTIL) (2.5 MG/3ML) 0.083% nebulizer solution Take 3 mLs (2.5 mg total) by nebulization every 6 (six) hours as needed for wheezing or shortness of breath. 11/22/17  Yes [name], MD  cyclobenzaprine (FLEXERIL) 10 MG tablet  Take 1 tablet (10 mg total) by mouth 3 (three) times daily as needed for muscle spasms. 11/19/17  Yes Lyndal Pulley, DO  HYDROcodone-acetaminophen (NORCO/VICODIN) 5-325 MG tablet Take 1 tablet by mouth 2 (two) times daily as needed for moderate pain (pain). 04/16/18  Yes [name], MD  metoprolol tartrate (LOPRESSOR) 25 MG tablet Take 1 tablet (25 mg total) by mouth 2 (two) times daily. 01/24/18  Yes Belva Crome, MD   Physical Exam: Blood pressure 133/78, pulse 76, temperature 98.2 F (36.8 C), temperature source Oral, resp. rate 20, height 5' 2"  (1.575 m), weight 58.5 kg, SpO2 99 %. 1. General:  in No Acute distress  Chronically ill -appearing 2. Psychological: Alert and   Oriented 3. Head/ENT:    Dry Mucous Membranes                          Head Non traumatic, neck supple                           Poor Dentition 4. SKIN:   decreased Skin turgor,  Skin clean Dry and intact no rash 5. Heart: Regular rate and rhythm no Murmur, no Rub or gallop 6. Lungs:  no wheezes or crackles   7. Abdomen: Soft,  non-tender  distended   Obese bowel sounds diminished 8. Lower extremities:  no clubbing, cyanosis, or  edema 9. Neurologically Grossly intact, moving all 4 extremities equally   10. MSK: Normal range of motion   LABS:     Recent Labs  Lab 04/24/18 1646  WBC 13.5*  HGB 14.5  HCT 45.1  MCV 95.6  PLT 790   Basic Metabolic Panel: Recent Labs  Lab 04/24/18 1646  NA 138  K 4.6  CL 100  CO2 30  GLUCOSE 103*  BUN 18  CREATININE 0.76  CALCIUM 9.7      Recent Labs  Lab 04/24/18 1646  AST 17  ALT 14  ALKPHOS 59  BILITOT 1.0  PROT 7.1  ALBUMIN 4.5   Recent Labs  Lab 04/24/18 1646  LIPASE 33   No results for input(s): AMMONIA in the last 168 hours.    HbA1C: No results for input(s): HGBA1C in the last 72 hours. CBG: No results for input(s): GLUCAP in the last 168 hours.    Urine analysis:    Component Value Date/Time   COLORURINE YELLOW  02/14/2017 [place] 02/14/2017 0947   LABSPEC 1.009 02/14/2017 0947   PHURINE 5.0 02/14/2017 0947   GLUCOSEU NEGATIVE 02/14/2017 0947   GLUCOSEU NEGATIVE 07/18/2013 1009   HGBUR MODERATE (A) 02/14/2017 0947   BILIRUBINUR NEGATIVE 02/14/2017 0947   BILIRUBINUR negative 04/17/2014 1016   KETONESUR NEGATIVE 02/14/2017 0947   PROTEINUR NEGATIVE 02/14/2017 0947   UROBILINOGEN 0.2 04/17/2014 1016   UROBILINOGEN 0.2 07/18/2013 1009   NITRITE NEGATIVE 02/14/2017 0947   LEUKOCYTESUR NEGATIVE 02/14/2017 0947     Cultures:    Component Value Date/Time   SDES  11/14/2017 1717    BLOOD LEFT FOREARM Performed at [facility], [place] [address], [place], [place] [address]    SPECREQUEST  11/14/2017 1717    BOTTLES DRAWN AEROBIC AND ANAEROBIC Blood Culture results may not be optimal due to an excessive volume of blood received in culture bottles Performed at [facility], [place] [address] [place], [place] [address]    CULT  11/14/2017 1717    NO GROWTH 5 DAYS Performed at [facility] [address] [place], [place] [address]    REPTSTATUS 11/19/2017 FINAL 11/14/2017 1717     Radiological Exams on Admission: Ct Abdomen Pelvis W Contrast  Result Date: 04/24/2018 CLINICAL DATA:  68 year old female presents with bilateral lower abdominal pain. History of multiple episodes of bowel obstruction, Crohn's ileitis, and hemicolectomy. EXAM: CT ABDOMEN AND PELVIS WITH CONTRAST TECHNIQUE: Multidetector CT imaging of the abdomen and pelvis was performed using the standard protocol following bolus administration of intravenous contrast. CONTRAST:  162m ISOVUE-300 IOPAMIDOL (ISOVUE-300) INJECTION 61% COMPARISON:  02/14/2017 FINDINGS: Lower chest: Normal included heart size without pericardial effusion. Lung bases are unremarkable with subsegmental atelectasis in the lingula. Hepatobiliary: No focal liver abnormality is seen. Status post  cholecystectomy. No biliary dilatation. Pancreas: Unremarkable. No pancreatic ductal dilatation or surrounding inflammatory changes. Spleen: Normal in size without focal abnormality. Adrenals/Urinary Tract: Redemonstration of hypodense indeterminate left adrenal mass measuring 2.2 x 2.6 x 2.8 cm. The right adrenal gland is unremarkable as are both kidneys and urinary bladder. Stomach/Bowel: Fluid-filled distention of the stomach is identified with normal small bowel rotation. Mucosal enhancement and slight fold thickening of jejunal loops with fluid-filled distention of the remainder of the small bowel are identified measuring up to 3.2 cm in caliber. Gradual tapering of small bowel is noted in the right lower quadrant tapering to site of ileocolonic anastomosis  in the right hemiabdomen, series 5/21 through 27. Slight retraction noted of small bowel proximal to the area of gradual tapering, series 5/16 that may represent an adhesion accounting for early or high-grade partial SBO. Differential considerations might also include malabsorption given mucosal enhancement and fold thickening of more proximal jejunum, small bowel enteritis or ileus. Favor obstruction. Vascular/Lymphatic: Aortoiliac atherosclerosis without aneurysm or adenopathy. Reproductive: Hysterectomy. No adnexal mass. Other: Small amount of free fluid adjacent to the spleen. Musculoskeletal: Degenerative grade 1 anterolisthesis of L4 on L5 with moderate disc flattening at L5-S1. L3 through S1 facet arthropathy. No aggressive osseous lesions or fracture. IMPRESSION: 1. Abnormal fluid-filled distention of small bowel loops with transition point in the right lower quadrant. Slight retraction of a short segment of small bowel in the right lower quadrant raises suspicion for small bowel obstruction secondary to adhesion. Status post right hemicolectomy. Small amount of ascites in the left upper quadrant. 2. Redemonstration of hypodense indeterminate left  adrenal mass measuring 2.2 x 2.6 x 2.8 cm. 3. Status post cholecystectomy and hysterectomy. 4. Aortoiliac atherosclerosis. Electronically Signed   By: Ashley Royalty M.D.   On: 04/24/2018 19:11    Chart has been reviewed    Assessment/Plan  68 y.o. female with medical history significant of colon cancer, anemia, back pain, CHF, COPD, Crohn's disease GERD, HLD, HTN, Takotsubo syndrome In the past  Admitted for SBO  Present on Admission: . SBO (small bowel obstruction) (HCC) -  Likely cause  adhesions,    - admit for conservative management  - NG tube - NPO - KUB in AM - appreciate General surgery consult.   [name] COPD (chronic obstructive pulmonary disease) with emphysema (HCC) currently stable continue home medications . Essential hypertension -chronic hold home medications while n.p.o. currently stable . Crohn disease ([place]) chronic and stable . GERD chronic resume home medications when able to take p.o. Tobacco abuse-  - Spoke about importance of quitting spent 5 minutes discussing options for treatment, prior attempts at quitting, and dangers of smoking  -At this point patient is    interested in quitting taking her time does not wish to have nicotine patch   - nursing tobacco cessation protocol  . Cancer of ascending colon  s/p colectomy 03/05/2009 currently stable     Other plan as per orders.  DVT prophylaxis:  SCD    Code Status:  FULL CODE  as per patient   I had personally discussed CODE STATUS with patient   Family Communication:   Family not  at  Bedside    Disposition Plan:      To home once workup is complete and patient is stable                                     Consults called: general Surgery   Admission status:   inpatient     Expect 2 midnight stay secondary to severity of patient's current illness including     Severe lab/radiological abnormalities including:   SBo and extensive comorbidities including:     Chronic pain     CHF    COPD           hx of  malignancy, .    That are currently affecting medical management.   I expect  patient to be hospitalized for 2 midnights requiring inpatient medical care.  Patient is at high risk for adverse outcome (such as  loss of life or disability) if not treated.  Indication for inpatient stay as follows:    severe pain requiring acute inpatient management,  inability to maintain oral hydration    Need for operative/procedural  intervention New or worsening hypoxia  Need for IV antibiotics, IV fluids,    IV pain medications    Level of care    medical floor             Toy Baker 04/24/2018, 9:54 PM    [facility]     after 2 AM please page floor coverage PA If 7AM-7PM, please contact the day team taking care of the patient using [email or web address]

## 2018-04-24 NOTE — ED Provider Notes (Signed)
[facility] DEPT Provider Note   CSN: [id number] Arrival date & time: 04/24/18  1609     History   Chief Complaint Chief Complaint  Patient presents with  . Abdominal Pain    HPI Erica [name] is a 68 y.o. female.  HPI Patient reports she started having abdominal pain about midday today.  She reports she has history of multiple prior bowel obstructions.  She reports it feels the same.  She reports the pain is severe and cramping in nature.  She reports that accelerated very rapidly.  She started vomiting just after coming to the emergency department.  She has had several episodes of vomiting thin yellow material.  She reports she had a normal bowel movement as of this morning.  No fever.  She reports she has COPD with chronic cough but has not been having any immediate worsening recently.  Patient does continue to smoke.  She denies any urinary symptoms of pain burning urgency.  Patient has history of Crohn's disease and sees [facility]. Past Medical History:  Diagnosis Date  . ADENOCARCINOMA, COLON, HX OF 03/2009  . ANEMIA   . BACK PAIN, LUMBAR   . Cancer [facility]) 2010   colon cancer  . CHF (congestive heart failure) ([place])   . COPD (chronic obstructive pulmonary disease) with emphysema ([place])   . Crohn's  02/2010   ileal ulcers, intol of entercort--refuses treatment  . Duodenitis 03/29/2017   Peptic  . GERD   . History of transfusion of whole blood   . HYPERLIPIDEMIA   . HYPERTENSION   . Microscopic hematuria    chronic  . Obstruction of intestine or colon (HCC)    adhesions  . OSTEOARTHRITIS   . Rectal fissure    Possible fissure  . Rotator cuff tear   . Takotsubo syndrome 12/2008  . URINARY INCONTINENCE     Patient Active Problem List   Diagnosis Date Noted  . Rotator cuff arthropathy of right shoulder 04/18/2018  . COPD exacerbation ([place]) 11/14/2017  . Trigger point of right shoulder region 08/29/2017  . Encounter  for chronic pain management 08/24/2017  . Narcotic dependence ([place]) 05/25/2017  . Tobacco abuse 02/14/2017  . History of Crohn's disease 02/14/2017  . Lumbar radiculopathy, acute 12/13/2016  . Vitamin D deficiency 08/17/2016  . Positive anti-CCP test 06/05/2016  . Cervical disc disorder with radiculopathy of cervical region 06/08/2014  . Right shoulder pain 04/20/2014  . Subacromial bursitis 01/26/2014  . Complete rotator cuff tear of left shoulder 11/27/2013  . Primary localized osteoarthrosis, lower leg 11/27/2013  . COPD with exacerbation ([place])   . Microscopic hematuria   . 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 History of malignant neoplasm of large intestine 02/18/2010  . Hyperlipidemia 02/16/2010  . GERD 02/16/2010  . Osteoarthritis 02/16/2010  . Essential hypertension 02/14/2010  . Takotsubo syndrome 02/12/2009    Past Surgical History:  Procedure Laterality Date  . ABDOMINAL HYSTERECTOMY  1994  . APPENDECTOMY  1964  . BLADDER SUSPENSION    . CESAREAN SECTION     x 3  . CHOLECYSTECTOMY  1995  . COLON SURGERY  03/2009   hemicolectomy colon cancer  . COLONOSCOPY W/ BIOPSIES AND POLYPECTOMY  01/24/2011   (Crohn's)ileitis, internal hemorrhoids  . ECTOPIC PREGNANCY SURGERY    . ESOPHAGOGASTRODUODENOSCOPY    . FACIAL COSMETIC SURGERY    . HAND SURGERY Bilateral 1991  . KNEE SURGERY Right 1981  . right knee  replacement  03/2010  . supraorbital fracture surgery    . TONSILLECTOMY  1952  . UPPER GASTROINTESTINAL ENDOSCOPY  05/16/2010   normal  . UTERINE SUSPENSION    . VIDEO BRONCHOSCOPY Bilateral 12/18/2014   Procedure: VIDEO BRONCHOSCOPY WITHOUT FLUORO;  Surgeon: Tanda Rockers, MD;  Location: [facility] END[facility]COPY;  Service: Cardiopulmonary;  Laterality: Bilateral;     OB History   No obstetric history on file.      Home Medications    Prior to Admission medications   Medication Sig Start Date End Date Taking? Authorizing  Provider  acetaminophen (TYLENOL) 500 MG tablet Take 1,000 mg by mouth every 6 (six) hours as needed for mild pain, moderate pain or headache.    [provider]  albuterol (PROVENTIL HFA;VENTOLIN HFA) 108 (90 Base) MCG/ACT inhaler INHALE 2 PUFFS BY MOUTH EVERY 4 HOURS AS NEEDED FOR WHEEZING OR SHORTNESS OF BREATH 12/05/17   [name], MD  albuterol (PROVENTIL) (2.5 MG/3ML) 0.083% nebulizer solution Take 3 mLs (2.5 mg total) by nebulization every 6 (six) hours as needed for wheezing or shortness of breath. 11/22/17   [name], MD  cyclobenzaprine (FLEXERIL) 10 MG tablet Take 1 tablet (10 mg total) by mouth 3 (three) times daily as needed for muscle spasms. 11/19/17   Lyndal Pulley, DO  HYDROcodone-acetaminophen (NORCO/VICODIN) 5-325 MG tablet Take 1 tablet by mouth 2 (two) times daily as needed for moderate pain (pain). 04/16/18   [name], MD  metoprolol tartrate (LOPRESSOR) 25 MG tablet Take 1 tablet (25 mg total) by mouth 2 (two) times daily. 01/24/18   Belva Crome, MD    Family History Family History  Problem Relation Age of Onset  . Colon cancer Father 35  . Hypertension Father   . Heart disease Father   . Kidney disease Father   . Colon cancer Sister 10  . Liver cancer Sister   . Other Sister        amyloidosis  . Throat cancer Mother   . Arthritis Other        Parent, other relative    Social History Social History   Tobacco Use  . Smoking status: Current Every Day Smoker    Packs/day: 1.00    Years: 50.00    Pack years: 50.00    Types: Cigarettes  . Smokeless tobacco: Never Used  Substance Use Topics  . Alcohol use: No    Alcohol/week: 0.0 standard drinks  . Drug use: No     Allergies   Morphine   Review of Systems Review of Systems 10 Systems reviewed and are negative for acute change except as noted in the HPI.   Physical Exam Updated Vital Signs BP 140/80 (BP Location: Right Arm)   Pulse 92   Temp 98.2  F (36.8 C) (Oral)   Resp 20   Ht 5' 2"  (1.575 m)   Wt 58.5 kg   SpO2 96%   BMI 23.59 kg/m   Physical Exam Constitutional:      Comments: Patient is alert.  No respiratory distress.  She is very uncomfortable in appearance.  She is having intermittent dry heaves.  Slightly diaphoretic.  HENT:     Head: Normocephalic and atraumatic.     Mouth/Throat:     Mouth: Mucous membranes are moist.  Eyes:     Extraocular Movements: Extraocular movements intact.  Cardiovascular:     Comments: Heart borderline tachycardia.  No rub murmur gallop. Pulmonary:  Comments: No respiratory distress.  Breath sounds are soft throughout lung fields.  Occasional fine expiratory wheeze. Abdominal:     Comments: Abdomen is mildly distended.  Abdomen is soft but diffusely tender.  Positive bowel sounds.  Musculoskeletal: Normal range of motion.        General: No swelling or tenderness.     Right lower leg: No edema.     Left lower leg: No edema.  Skin:    General: Skin is warm.     Comments: Skin is diaphoretic.  Neurological:     General: No focal deficit present.     Mental Status: She is oriented to person, place, and time.     Coordination: Coordination normal.      ED Treatments / Results  Labs (all labs ordered are listed, but only abnormal results are displayed) Labs Reviewed  CBC - Abnormal; Notable for the following components:      Result Value   WBC 13.5 (*)    All other components within normal limits  LIPASE, BLOOD  COMPREHENSIVE METABOLIC PANEL  URINALYSIS, ROUTINE W REFLEX MICROSCOPIC    EKG None  Radiology No results found.  Procedures Procedures (including critical care time)  Medications Ordered in ED Medications  sodium chloride flush (NS) 0.9 % injection 3 mL (has no administration in time range)     Initial Impression / Assessment and Plan / ED Course  I have reviewed the triage vital signs and the nursing notes.  Pertinent labs & imaging results  that were available during my care of the patient were reviewed by me and considered in my medical decision making (see chart for details).    Patient with history of Crohn's disease and prior bowel obstruction presents with crampy discomfort and vomiting.  She had concern for repeat obstruction.  CT shows dilated small bowel and a transition zone per radiology suggestive of bowel obstruction.  Surgery consulted.  Patient admitted to medical service.  Final Clinical Impressions(s) / ED Diagnoses   Final diagnoses:  Encounter for imaging study to confirm nasogastric (NG) tube placement  Small bowel obstruction ([place])  SBO (small bowel obstruction) [facility])    ED Discharge Orders    None       Charlesetta Shanks, MD 05/01/18 [phone]

## 2018-04-24 NOTE — ED Triage Notes (Signed)
Patient c/o bilateral lower abdominal pain. Patient reports a history of multiple episodes of bowel obstruction. Patient states she had a normal BM this AM. Patient denies any n/v.

## 2018-04-24 NOTE — ED Notes (Signed)
ED TO INPATIENT HANDOFF REPORT  Name/Age/Gender [name] 68 y.o. female  Code Status Code Status History    Date Active Date Inactive Code Status Order ID Comments User Context   11/14/2017 2108 11/16/2017 1535 Full Code [id number]  Toy Baker, MD Inpatient   02/14/2017 1558 02/16/2017 1553 Full Code [id number]  Bonnielee Haff, MD Inpatient   08/16/2015 0241 08/16/2015 2132 Full Code [id number]  Danford, Suann Larry, MD Inpatient      Home/SNF/Other Home  Chief Complaint Bowel obstruction  Level of Care/Admitting Diagnosis ED Disposition    ED Disposition Condition [place] [facility] Area: [facility] [[id number]]  Level of Care: Med-Surg [16]  Diagnosis: SBO (small bowel obstruction) [facility]) [[id number]]  Admitting Physician: Toy Baker [[id number]]  Attending Physician: Toy Baker [[id number]]  Estimated length of stay: past midnight tomorrow  Certification:: I certify this patient will need inpatient services for at least 2 midnights  PT Class (Do Not Modify): Inpatient [101]  PT Acc Code (Do Not Modify): Private [1]       Medical History Past Medical History:  Diagnosis Date  . Acute duodenitis 04/24/2017  . ANEMIA   . BACK PAIN, LUMBAR   . Cancer of ascending colon [id number] s/p colectomy 03/05/2009 02/18/2010   Qualifier: Diagnosis of  By: Carlean Purl MD, Dimas Millin   . CHF (congestive heart failure) ([place])   . Complete rotator cuff tear of left shoulder 11/27/2013   Ultrasound guided injection on November 27, 2013   . COPD (chronic obstructive pulmonary disease) with emphysema ([place])   . COPD exacerbation ([place]) 11/14/2017  . Crohn's  02/2010   ileal ulcers, intol of entercort--refuses treatment  . GERD   . History of transfusion of whole blood   . HYPERLIPIDEMIA   . HYPERTENSION   . Microscopic hematuria    chronic  . Obstruction of intestine or colon (HCC)    adhesions  . OSTEOARTHRITIS   . Rectal fissure    Possible  fissure  . Rotator cuff tear   . Takotsubo syndrome 12/2008  . URINARY INCONTINENCE     Allergies Allergies  Allergen Reactions  . Morphine Nausea And Vomiting    IV Location/Drains/Wounds Patient Lines/Drains/Airways Status   Active Line/Drains/Airways    Name:   Placement date:   Placement time:   Site:   Days:   Peripheral IV 04/24/18 Right Antecubital   04/24/18    1739    Antecubital   less than 1   NG/OG Tube Nasogastric 18 Fr. Right nare Aucultation;Xray   04/24/18    2042    Right nare   less than 1          Labs/Imaging Results for orders placed or performed during the hospital encounter of 04/24/18 (from the past 48 hour(s))  Urinalysis, Routine w reflex microscopic     Status: Abnormal   Collection Time: 04/24/18  4:41 PM  Result Value Ref Range   Color, Urine YELLOW YELLOW   APPearance CLEAR CLEAR   Specific Gravity, Urine >1.046 (H) 1.005 - 1.030   pH 5.0 5.0 - 8.0   Glucose, UA NEGATIVE NEGATIVE mg/dL   Hgb urine dipstick MODERATE (A) NEGATIVE   Bilirubin Urine NEGATIVE NEGATIVE   Ketones, ur NEGATIVE NEGATIVE mg/dL   Protein, ur NEGATIVE NEGATIVE mg/dL   Nitrite NEGATIVE NEGATIVE   Leukocytes, UA NEGATIVE NEGATIVE   RBC / HPF 0-5 0 - 5 RBC/hpf   WBC, UA 0-5 0 - 5 WBC/hpf  Bacteria, UA NONE SEEN NONE SEEN   Squamous Epithelial / LPF 0-5 0 - 5   Mucus PRESENT     Comment: Performed at [facility], [place] [address], [place], [place] [address]  Lipase, blood     Status: None   Collection Time: 04/24/18  4:46 PM  Result Value Ref Range   Lipase 33 11 - 51 U/L    Comment: Performed at [facility], [place] [address], [place], [place] [address]  Comprehensive metabolic panel     Status: Abnormal   Collection Time: 04/24/18  4:46 PM  Result Value Ref Range   Sodium 138 135 - 145 mmol/L   Potassium 4.6 3.5 - 5.1 mmol/L   Chloride 100 98 - 111 mmol/L   CO2 30 22 - 32 mmol/L   Glucose, Bld 103 (H) 70 - 99 mg/dL   BUN 18  8 - 23 mg/dL   Creatinine, Ser 0.76 0.44 - 1.00 mg/dL   Calcium 9.7 8.9 - 10.3 mg/dL   Total Protein 7.1 6.5 - 8.1 g/dL   Albumin 4.5 3.5 - 5.0 g/dL   AST 17 15 - 41 U/L   ALT 14 0 - 44 U/L   Alkaline Phosphatase 59 38 - 126 U/L   Total Bilirubin 1.0 0.3 - 1.2 mg/dL   GFR calc non Af Amer >60 >60 mL/min   GFR calc Af Amer >60 >60 mL/min   Anion gap 8 5 - 15    Comment: Performed at [facility], [place] [address], [place], [place] [address]  CBC     Status: Abnormal   Collection Time: 04/24/18  4:46 PM  Result Value Ref Range   WBC 13.5 (H) 4.0 - 10.5 K/uL   RBC 4.72 3.87 - 5.11 MIL/uL   Hemoglobin 14.5 12.0 - 15.0 g/dL   HCT 45.1 36.0 - 46.0 %   MCV 95.6 80.0 - 100.0 fL   MCH 30.7 26.0 - 34.0 pg   MCHC 32.2 30.0 - 36.0 g/dL   RDW 13.1 11.5 - 15.5 %   Platelets 359 150 - 400 K/uL   nRBC 0.0 0.0 - 0.2 %    Comment: Performed at [facility], [place] [address], [place], [place] [address]  Lactic acid, plasma     Status: None   Collection Time: 04/24/18  5:55 PM  Result Value Ref Range   Lactic Acid, Venous 1.2 0.5 - 1.9 mmol/L    Comment: Performed at [facility], [place] [address], [place], [place] [address]   Ct Abdomen Pelvis W Contrast  Result Date: 04/24/2018 CLINICAL DATA:  68 year old female presents with bilateral lower abdominal pain. History of multiple episodes of bowel obstruction, Crohn's ileitis, and hemicolectomy. EXAM: CT ABDOMEN AND PELVIS WITH CONTRAST TECHNIQUE: Multidetector CT imaging of the abdomen and pelvis was performed using the standard protocol following bolus administration of intravenous contrast. CONTRAST:  133m ISOVUE-300 IOPAMIDOL (ISOVUE-300) INJECTION 61% COMPARISON:  02/14/2017 FINDINGS: Lower chest: Normal included heart size without pericardial effusion. Lung bases are unremarkable with subsegmental atelectasis in the lingula. Hepatobiliary: No focal liver abnormality is seen. Status post  cholecystectomy. No biliary dilatation. Pancreas: Unremarkable. No pancreatic ductal dilatation or surrounding inflammatory changes. Spleen: Normal in size without focal abnormality. Adrenals/Urinary Tract: Redemonstration of hypodense indeterminate left adrenal mass measuring 2.2 x 2.6 x 2.8 cm. The right adrenal gland is unremarkable as are both kidneys and urinary bladder. Stomach/Bowel: Fluid-filled distention of the stomach is identified  with normal small bowel rotation. Mucosal enhancement and slight fold thickening of jejunal loops with fluid-filled distention of the remainder of the small bowel are identified measuring up to 3.2 cm in caliber. Gradual tapering of small bowel is noted in the right lower quadrant tapering to site of ileocolonic anastomosis in the right hemiabdomen, series 5/21 through 27. Slight retraction noted of small bowel proximal to the area of gradual tapering, series 5/16 that may represent an adhesion accounting for early or high-grade partial SBO. Differential considerations might also include malabsorption given mucosal enhancement and fold thickening of more proximal jejunum, small bowel enteritis or ileus. Favor obstruction. Vascular/Lymphatic: Aortoiliac atherosclerosis without aneurysm or adenopathy. Reproductive: Hysterectomy. No adnexal mass. Other: Small amount of free fluid adjacent to the spleen. Musculoskeletal: Degenerative grade 1 anterolisthesis of L4 on L5 with moderate disc flattening at L5-S1. L3 through S1 facet arthropathy. No aggressive osseous lesions or fracture. IMPRESSION: 1. Abnormal fluid-filled distention of small bowel loops with transition point in the right lower quadrant. Slight retraction of a short segment of small bowel in the right lower quadrant raises suspicion for small bowel obstruction secondary to adhesion. Status post right hemicolectomy. Small amount of ascites in the left upper quadrant. 2. Redemonstration of hypodense indeterminate left  adrenal mass measuring 2.2 x 2.6 x 2.8 cm. 3. Status post cholecystectomy and hysterectomy. 4. Aortoiliac atherosclerosis. Electronically Signed   By: Ashley Royalty M.D.   On: 04/24/2018 19:11   Dg Abd Portable 1v-small Bowel Protocol-position Verification  Result Date: 04/24/2018 CLINICAL DATA:  Evaluate NG tube EXAM: PORTABLE ABDOMEN - 1 VIEW COMPARISON:  None. FINDINGS: The side port of the NG tube is below the GE junction with the distal tip in the gastric body. IMPRESSION: NG tube placement terminating in the stomach as above. Electronically Signed   By: Dorise Bullion III M.D   On: 04/24/2018 20:58    Pending Labs Unresulted Labs (From admission, onward)    Start     Ordered   04/25/18 4818  Basic metabolic panel  Tomorrow morning,   R    Comments:  If K < 3.5, give 56mq KCl in 10MEq runs per protocol.  Pharmacy may adjust dosing strength, schedule, rate of infusion, etc as needed to optimize therapy   Question:  Specimen collection method  Answer:  IV Team   04/24/18 2004   04/25/18 0500  CBC  Tomorrow morning,   R    Question:  Specimen collection method  Answer:  IV Team   04/24/18 2004          Vitals/Pain Today's Vitals   04/24/18 1900 04/24/18 1915 04/24/18 1937 04/24/18 1938  BP: 100/81 133/69  133/78  Pulse:  71  76  Resp:    20  Temp:      TempSrc:      SpO2:  90%  99%  Weight:      Height:      PainSc:   6      Isolation Precautions No active isolations  Medications Medications  lactated ringers infusion (has no administration in time range)  iopamidol (ISOVUE-300) 61 % injection (has no administration in time range)  sodium chloride (PF) 0.9 % injection (has no administration in time range)  diatrizoate meglumine-sodium (GASTROGRAFIN) 66-10 % solution 90 mL (has no administration in time range)  lactated ringers bolus 1,000 mL (has no administration in time range)  methocarbamol (ROBAXIN) 1,000 mg in dextrose 5 % 50 mL IVPB (has no administration in  time range)  ondansetron (ZOFRAN) injection 4 mg (has no administration in time range)    Or  ondansetron (ZOFRAN) 8 mg in sodium chloride 0.9 % 50 mL IVPB (has no administration in time range)  prochlorperazine (COMPAZINE) injection 5-10 mg (has no administration in time range)  lip balm (CARMEX) ointment 1 application (has no administration in time range)  magic mouthwash (has no administration in time range)  bisacodyl (DULCOLAX) suppository 10 mg (has no administration in time range)  guaiFENesin-dextromethorphan (ROBITUSSIN DM) 100-10 MG/5ML syrup 10 mL (has no administration in time range)  hydrocortisone (ANUSOL-HC) 2.5 % rectal cream 1 application (has no administration in time range)  alum & mag hydroxide-simeth (MAALOX/MYLANTA) 200-200-20 MG/5ML suspension 30 mL (has no administration in time range)  hydrocortisone cream 1 % 1 application (has no administration in time range)  menthol-cetylpyridinium (CEPACOL) lozenge 3 mg (has no administration in time range)  phenol (CHLORASEPTIC) mouth spray 1-2 spray (has no administration in time range)  diphenhydrAMINE (BENADRYL) injection 12.5-25 mg (has no administration in time range)  lidocaine (XYLOCAINE) 2 % jelly 1 application (has no administration in time range)  albuterol (PROVENTIL) (2.5 MG/3ML) 0.083% nebulizer solution 2.5 mg (has no administration in time range)  sodium chloride flush (NS) 0.9 % injection 3 mL (3 mLs Intravenous Given 04/24/18 1740)  HYDROmorphone (DILAUDID) injection 1 mg (1 mg Intravenous Given 04/24/18 1819)  ondansetron (ZOFRAN) injection 4 mg (4 mg Intravenous Given 04/24/18 1816)  lactated ringers bolus 500 mL (500 mLs Intravenous New Bag/Given (Non-Interop) 04/24/18 1816)  pantoprazole (PROTONIX) injection 40 mg (40 mg Intravenous Given 04/24/18 1817)  iopamidol (ISOVUE-300) 61 % injection 100 mL (100 mLs Intravenous Contrast Given 04/24/18 1831)  HYDROmorphone (DILAUDID) injection 1 mg (1 mg Intravenous Given  04/24/18 2002)  ondansetron (ZOFRAN) injection 4 mg (4 mg Intravenous Given 04/24/18 2001)    Mobility walks

## 2018-04-24 NOTE — Consult Note (Signed)
Erica [name]  1950/08/05 [id number]  CARE TEAM:  PCP: [name], MD  Outpatient Care Team: Patient Care Team: [name], MD as PCP - General (Internal Medicine) Belva Crome, MD as PCP - Cardiology (Cardiology) Gatha Mayer, MD as Consulting Physician (Gastroenterology) Paralee Cancel, MD as Consulting Physician (Orthopedic Surgery) Melina Schools, MD as Consulting Physician (Orthopedic Surgery) Kristeen Miss, MD as Consulting Physician (Neurosurgery)  Inpatient Treatment Team: Treatment Team: Attending Provider: Charlesetta Shanks, MD; Technician: Jaquelyn Bitter, EMT; Registered Nurse: Raechel Ache, RN; Consulting Physician: Edison Pace, Md, MD   This patient is a 68 y.o.female who presents today for surgical evaluation at the request of Dr Johnney Killian.   Chief complaint / Reason for evaluation: SBO in patient with Crohns  Patient with history of Crohn's disease.  Followed by [facility] gastroenterology in the past.  She had evidence of active ileitis on colonoscopy in July 2017 by Dr. Alison Stalling.  I think there was discussion of trying immunosuppression but that did not happen.  Last saw gastroenterology February 2019.  Discussion of trying Remicade or Humira.  That did not happen most likely due to having a sickly spouse and changes in insurance.  History of COPD followed by [place] [facility].  Chronic cervical and low back pain with prior surgeries and rheumatology involvement with poly-myalgia.  On hydrocodone for her chronic pain.  Followed closely by  her primary care/sports and chronic pain medicine team.  History of fair compliance to immunosuppression and Crohn's disease most likely due to financial issues and tolerance of medications.  She is not on any immunosuppression medication at this time.  She is claimed the past to be able to control it with diet alone.  Distant history of open cholecystectomy, appendectomy, hysterectomy.  History of bowel obstruction  requiring lysis of adhesions in 2010.  History of ascending colon cancer requiring partial colectomy later in the same year 2010.  That is the last abdominal surgery as far as I can tell.  She is due for 3-year follow-up colonoscopy in 2020, this year.  History of bowel obstruction at least in 2018 resolved nonoperatively.  She does smoke.  History of COPD.  Admitted last year with a flare.  On inhalers.  Patient had episode of crampy abdominal pain with nausea vomiting starting today.  Suspected she had another obstruction.  Can emergency room dehydrated.  Better with pain nausea medications and IV fluid.  CT scan shows diffusely dilated small intestine with transition zone more in the right lower quadrant.  Bowel obstruction suspected.  Surgical consultation requested.   Assessment  Erica [name]  68 y.o. female       Problem List:  Principal Problem:   Crohn's ileitis, with intestinal obstruction ([place]) Active Problems:   Crohn disease ([place])   COPD (chronic obstructive pulmonary disease) with emphysema ([place])   Essential hypertension   GERD   Cancer of ascending colon [id number] s/p colectomy 03/05/2009   Tobacco abuse   Narcotic dependence ([place])   Acute duodenitis   Adrenal mass, left (HCC)   SBO (small bowel obstruction) ([place])   SBO in the setting of poorly controlled Crohn's disease and prior ascending colon cancer resection.  Plan:  Medical admission.  Nasogastric tube decompression.  Rehydration.  Small bowel protocol.  Low threshold to reconsult gastroenterology.  She is over a year overdue for 85-monthfollow-up by Dr. GArelia Longestfrom Feb 2019.  See if she has ileitis as she did the last colonoscopy 2017  that would benefit from Remicade/Humira.  Sounds like they were trying to do it but there were financial issues according to the patient.  COPD per medicine.  Chronic pain management per medicine.  General surgery will follow with you.  If she does not open up  nonoperatively, she may require repeat operation.  However, in the setting Crohn's disease would like to rule out active ileitis as the source of this.  No major inflammation on CT scan but patient with numerous prior surgeries and poor control raises concerns.  VTE prophylaxis- SCDs, etc  Mobilize as tolerated to help recovery  55 minutes spent in review, evaluation, examination, counseling, and coordination of care.  More than 50% of that time was spent in counseling.  Adin Hector, MD, FACS, MASCRS Gastrointestinal and Minimally Invasive Surgery    [address] [place], [place] [address] [phone] Main / Paging [phone] Fax   04/24/2018      Past Medical History:  Diagnosis Date  . ADENOCARCINOMA, COLON, HX OF 03/2009  . ANEMIA   . BACK PAIN, LUMBAR   . Cancer [facility]) 2010   colon cancer  . CHF (congestive heart failure) ([place])   . COPD (chronic obstructive pulmonary disease) with emphysema ([place])   . Crohn's  02/2010   ileal ulcers, intol of entercort--refuses treatment  . Duodenitis 03/29/2017   Peptic  . GERD   . History of transfusion of whole blood   . HYPERLIPIDEMIA   . HYPERTENSION   . Microscopic hematuria    chronic  . Obstruction of intestine or colon (HCC)    adhesions  . OSTEOARTHRITIS   . Rectal fissure    Possible fissure  . Rotator cuff tear   . Takotsubo syndrome 12/2008  . URINARY INCONTINENCE     Past Surgical History:  Procedure Laterality Date  . ABDOMINAL HYSTERECTOMY  1994  . APPENDECTOMY  1964  . BLADDER SUSPENSION    . CESAREAN SECTION     x 3  . CHOLECYSTECTOMY  1995  . COLON SURGERY  03/2009   hemicolectomy colon cancer  . COLONOSCOPY W/ BIOPSIES AND POLYPECTOMY  01/24/2011   (Crohn's)ileitis, internal hemorrhoids  . ECTOPIC PREGNANCY SURGERY    . ESOPHAGOGASTRODUODENOSCOPY    . FACIAL COSMETIC SURGERY    . HAND SURGERY Bilateral 1991  . KNEE SURGERY Right 1981  . right knee replacement   03/2010  . supraorbital fracture surgery    . TONSILLECTOMY  1952  . UPPER GASTROINTESTINAL ENDOSCOPY  05/16/2010   normal  . UTERINE SUSPENSION    . VIDEO BRONCHOSCOPY Bilateral 12/18/2014   Procedure: VIDEO BRONCHOSCOPY WITHOUT FLUORO;  Surgeon: Tanda Rockers, MD;  Location: [facility];  Service: Cardiopulmonary;  Laterality: Bilateral;    Social History   Socioeconomic History  . Marital status: Married    Spouse name: Not on file  . Number of children: Not on file  . Years of education: Not on file  . Highest education level: Not on file  Occupational History  . Occupation: Retired     Fish farm manager: UNEMPLOYED  Social Needs  . Financial resource strain: Not hard at all  . Food insecurity:    Worry: Never true    Inability: Never true  . Transportation needs:    Medical: No    Non-medical: No  Tobacco Use  . Smoking status: Current Every Day Smoker    Packs/day: 1.00    Years: 50.00    Pack years: 50.00  Types: Cigarettes  . Smokeless tobacco: Never Used  Substance and Sexual Activity  . Alcohol use: No    Alcohol/week: 0.0 standard drinks  . Drug use: No  . Sexual activity: Not on file  Lifestyle  . Physical activity:    Days per week: 7 days    Minutes per session: Patient refused  . Stress: Not at all  Relationships  . Social connections:    Talks on phone: More than three times a week    Gets together: More than three times a week    Attends religious service: More than 4 times per year    Active member of club or organization: Yes    Attends meetings of clubs or organizations: More than 4 times per year    Relationship status: Married  . Intimate partner violence:    Fear of current or ex partner: Not on file    Emotionally abused: Not on file    Physically abused: Not on file    Forced sexual activity: Not on file  Other Topics Concern  . Not on file  Social History Narrative   Lives with husband and 2 grand dtr; 14 and 6    Family History    Problem Relation Age of Onset  . Colon cancer Father 42  . Hypertension Father   . Heart disease Father   . Kidney disease Father   . Colon cancer Sister 57  . Liver cancer Sister   . Other Sister        amyloidosis  . Throat cancer Mother   . Arthritis Other        Parent, other relative    Current Facility-Administered Medications  Medication Dose Route Frequency Provider Last Rate Last Dose  . HYDROmorphone (DILAUDID) injection 1 mg  1 mg Intravenous Once Pfeiffer, Jeannie Done, MD      . iopamidol (ISOVUE-300) 61 % injection           . lactated ringers infusion   Intravenous Continuous Pfeiffer, Jeannie Done, MD      . ondansetron (ZOFRAN) injection 4 mg  4 mg Intravenous Once Pfeiffer, Jeannie Done, MD      . sodium chloride (PF) 0.9 % injection            Current Outpatient Medications  Medication Sig Dispense Refill  . albuterol (PROVENTIL HFA;VENTOLIN HFA) 108 (90 Base) MCG/ACT inhaler INHALE 2 PUFFS BY MOUTH EVERY 4 HOURS AS NEEDED FOR WHEEZING OR SHORTNESS OF BREATH 9 each 11  . albuterol (PROVENTIL) (2.5 MG/3ML) 0.083% nebulizer solution Take 3 mLs (2.5 mg total) by nebulization every 6 (six) hours as needed for wheezing or shortness of breath. 150 mL 1  . cyclobenzaprine (FLEXERIL) 10 MG tablet Take 1 tablet (10 mg total) by mouth 3 (three) times daily as needed for muscle spasms. 90 tablet 0  . HYDROcodone-acetaminophen (NORCO/VICODIN) 5-325 MG tablet Take 1 tablet by mouth 2 (two) times daily as needed for moderate pain (pain). 60 tablet 0  . metoprolol tartrate (LOPRESSOR) 25 MG tablet Take 1 tablet (25 mg total) by mouth 2 (two) times daily. 180 tablet 3     Allergies  Allergen Reactions  . Morphine Nausea And Vomiting    ROS:   All other systems reviewed & are negative except per HPI or as noted below: Constitutional:  No fevers, chills, sweats.  Weight stable Eyes:  No vision changes, No discharge HENT:  No sore throats, nasal drainage Lymph: No neck swelling, No bruising  easily Pulmonary:  No cough, productive sputum CV: No orthopnea, PND  Patient walks 30 minutes for about 1 miles without difficulty.  No exertional chest/neck/shoulder/arm pain. GI: No personal nor family history of irriitable bowel syndrome, allergy such as Celiac Sprue, dietary/dairy problems, colitis, ulcers nor gastritis.  No recent sick contacts/gastroenteritis.  No travel outside the country.  No changes in diet. Renal: No UTIs, No hematuria Genital:  No drainage, bleeding, masses Musculoskeletal: No severe joint pain.  Good ROM major joints Skin:  No sores or lesions.  No rashes Heme/Lymph:  No easy bleeding.  No swollen lymph nodes Neuro: No focal weakness/numbness.  No seizures Psych: No suicidal ideation.  No hallucinations  BP 133/78 (BP Location: Right Arm)   Pulse 76   Temp 98.2 F (36.8 C) (Oral)   Resp 20   Ht 5' 2"  (1.575 m)   Wt 58.5 kg   SpO2 99%   BMI 23.59 kg/m   Physical Exam: General: Pt awake/alert/oriented x4 in mild major acute distress Eyes: PERRL, normal EOM. Sclera nonicteric Neuro: CN II-XII intact w/o focal sensory/motor deficits. Lymph: No head/neck/groin lymphadenopathy Psych:  No delerium/psychosis/paranoia HENT: Normocephalic, Mucus membranes moist.  No thrush Neck: Supple, No tracheal deviation Chest: No pain.  Good respiratory excursion. CV:  Pulses intact.  Regular rhythm Abdomen: Mildly distended but soft with mild discomfort but no guarding.  No peritonitis.  No obvious hernias.  Gen:  No inguinal hernias.  No inguinal lymphadenopathy.   Ext:  SCDs BLE.  No significant edema.  No cyanosis Skin: No petechiae / purpurea.  No major sores Musculoskeletal: No severe joint pain.  Good ROM major joints   Results:   Labs: Results for orders placed or performed during the hospital encounter of 04/24/18 (from the past 48 hour(s))  Lipase, blood     Status: None   Collection Time: 04/24/18  4:46 PM  Result Value Ref Range   Lipase 33 11 - 51  U/L    Comment: Performed at [facility], [place] [address], [place], [place] [address]  Comprehensive metabolic panel     Status: Abnormal   Collection Time: 04/24/18  4:46 PM  Result Value Ref Range   Sodium 138 135 - 145 mmol/L   Potassium 4.6 3.5 - 5.1 mmol/L   Chloride 100 98 - 111 mmol/L   CO2 30 22 - 32 mmol/L   Glucose, Bld 103 (H) 70 - 99 mg/dL   BUN 18 8 - 23 mg/dL   Creatinine, Ser 0.76 0.44 - 1.00 mg/dL   Calcium 9.7 8.9 - 10.3 mg/dL   Total Protein 7.1 6.5 - 8.1 g/dL   Albumin 4.5 3.5 - 5.0 g/dL   AST 17 15 - 41 U/L   ALT 14 0 - 44 U/L   Alkaline Phosphatase 59 38 - 126 U/L   Total Bilirubin 1.0 0.3 - 1.2 mg/dL   GFR calc non Af Amer >60 >60 mL/min   GFR calc Af Amer >60 >60 mL/min   Anion gap 8 5 - 15    Comment: Performed at [facility], [place] [address], [place], [place] [address]  CBC     Status: Abnormal   Collection Time: 04/24/18  4:46 PM  Result Value Ref Range   WBC 13.5 (H) 4.0 - 10.5 K/uL   RBC 4.72 3.87 - 5.11 MIL/uL   Hemoglobin 14.5 12.0 - 15.0 g/dL   HCT 45.1 36.0 - 46.0 %   MCV 95.6 80.0 - 100.0 fL  MCH 30.7 26.0 - 34.0 pg   MCHC 32.2 30.0 - 36.0 g/dL   RDW 13.1 11.5 - 15.5 %   Platelets 359 150 - 400 K/uL   nRBC 0.0 0.0 - 0.2 %    Comment: Performed at [facility], [place] [address] [place], [place] [address]  Lactic acid, plasma     Status: None   Collection Time: 04/24/18  5:55 PM  Result Value Ref Range   Lactic Acid, Venous 1.2 0.5 - 1.9 mmol/L    Comment: Performed at [facility], [place] [address], [place], [place] [address]    Imaging / Studies: Ct Abdomen Pelvis W Contrast  Result Date: 04/24/2018 CLINICAL DATA:  68 year old female presents with bilateral lower abdominal pain. History of multiple episodes of bowel obstruction, Crohn's ileitis, and hemicolectomy. EXAM: CT ABDOMEN AND PELVIS WITH CONTRAST TECHNIQUE: Multidetector CT imaging of the abdomen and  pelvis was performed using the standard protocol following bolus administration of intravenous contrast. CONTRAST:  158m ISOVUE-300 IOPAMIDOL (ISOVUE-300) INJECTION 61% COMPARISON:  02/14/2017 FINDINGS: Lower chest: Normal included heart size without pericardial effusion. Lung bases are unremarkable with subsegmental atelectasis in the lingula. Hepatobiliary: No focal liver abnormality is seen. Status post cholecystectomy. No biliary dilatation. Pancreas: Unremarkable. No pancreatic ductal dilatation or surrounding inflammatory changes. Spleen: Normal in size without focal abnormality. Adrenals/Urinary Tract: Redemonstration of hypodense indeterminate left adrenal mass measuring 2.2 x 2.6 x 2.8 cm. The right adrenal gland is unremarkable as are both kidneys and urinary bladder. Stomach/Bowel: Fluid-filled distention of the stomach is identified with normal small bowel rotation. Mucosal enhancement and slight fold thickening of jejunal loops with fluid-filled distention of the remainder of the small bowel are identified measuring up to 3.2 cm in caliber. Gradual tapering of small bowel is noted in the right lower quadrant tapering to site of ileocolonic anastomosis in the right hemiabdomen, series 5/21 through 27. Slight retraction noted of small bowel proximal to the area of gradual tapering, series 5/16 that may represent an adhesion accounting for early or high-grade partial SBO. Differential considerations might also include malabsorption given mucosal enhancement and fold thickening of more proximal jejunum, small bowel enteritis or ileus. Favor obstruction. Vascular/Lymphatic: Aortoiliac atherosclerosis without aneurysm or adenopathy. Reproductive: Hysterectomy. No adnexal mass. Other: Small amount of free fluid adjacent to the spleen. Musculoskeletal: Degenerative grade 1 anterolisthesis of L4 on L5 with moderate disc flattening at L5-S1. L3 through S1 facet arthropathy. No aggressive osseous lesions or  fracture. IMPRESSION: 1. Abnormal fluid-filled distention of small bowel loops with transition point in the right lower quadrant. Slight retraction of a short segment of small bowel in the right lower quadrant raises suspicion for small bowel obstruction secondary to adhesion. Status post right hemicolectomy. Small amount of ascites in the left upper quadrant. 2. Redemonstration of hypodense indeterminate left adrenal mass measuring 2.2 x 2.6 x 2.8 cm. 3. Status post cholecystectomy and hysterectomy. 4. Aortoiliac atherosclerosis. Electronically Signed   By: DAshley RoyaltyM.D.   On: 04/24/2018 19:11    Medications / Allergies: per chart  Antibiotics: Anti-infectives (From admission, onward)   None        Note: Portions of this report may have been transcribed using voice recognition software. Every effort was made to ensure accuracy; however, inadvertent computerized transcription errors may be present.   Any transcriptional errors that result from this process are unintentional.    SAdin Hector MD, FACS, MASCRS Gastrointestinal and Minimally Invasive Surgery    [address] [place], [place] [address] [phone] Main / Paging [phone] Fax   04/24/2018

## 2018-04-24 NOTE — ED Notes (Signed)
Pt informed that urine collection is needed

## 2018-04-24 NOTE — ED Notes (Signed)
Below order not completed by EW.

## 2018-04-25 ENCOUNTER — Inpatient Hospital Stay (HOSPITAL_COMMUNITY): Payer: Medicare Other

## 2018-04-25 DIAGNOSIS — K5652 Intestinal adhesions [bands] with complete obstruction: Secondary | ICD-10-CM

## 2018-04-25 DIAGNOSIS — J439 Emphysema, unspecified: Secondary | ICD-10-CM

## 2018-04-25 DIAGNOSIS — K50012 Crohn's disease of small intestine with intestinal obstruction: Principal | ICD-10-CM

## 2018-04-25 DIAGNOSIS — K56609 Unspecified intestinal obstruction, unspecified as to partial versus complete obstruction: Secondary | ICD-10-CM

## 2018-04-25 DIAGNOSIS — Z720 Tobacco use: Secondary | ICD-10-CM

## 2018-04-25 DIAGNOSIS — K219 Gastro-esophageal reflux disease without esophagitis: Secondary | ICD-10-CM

## 2018-04-25 DIAGNOSIS — I10 Essential (primary) hypertension: Secondary | ICD-10-CM

## 2018-04-25 DIAGNOSIS — C182 Malignant neoplasm of ascending colon: Secondary | ICD-10-CM

## 2018-04-25 LAB — MAGNESIUM: Magnesium: 1.9 mg/dL (ref 1.7–2.4)

## 2018-04-25 LAB — TSH: TSH: 0.443 u[IU]/mL (ref 0.350–4.500)

## 2018-04-25 LAB — COMPREHENSIVE METABOLIC PANEL
ALT: 13 U/L (ref 0–44)
AST: 14 U/L — ABNORMAL LOW (ref 15–41)
Albumin: 3.5 g/dL (ref 3.5–5.0)
Alkaline Phosphatase: 53 U/L (ref 38–126)
Anion gap: 6 (ref 5–15)
BUN: 14 mg/dL (ref 8–23)
CO2: 24 mmol/L (ref 22–32)
Calcium: 8.7 mg/dL — ABNORMAL LOW (ref 8.9–10.3)
Chloride: 108 mmol/L (ref 98–111)
Creatinine, Ser: 0.63 mg/dL (ref 0.44–1.00)
GFR calc Af Amer: >60 mL/min (ref >60)
GFR calc non Af Amer: >60 mL/min (ref >60)
Glucose, Bld: 119 mg/dL — ABNORMAL HIGH (ref 70–99)
Potassium: 4.2 mmol/L (ref 3.5–5.1)
Sodium: 138 mmol/L (ref 135–145)
Total Bilirubin: 0.5 mg/dL (ref 0.3–1.2)
Total Protein: 5.8 g/dL — ABNORMAL LOW (ref 6.5–8.1)

## 2018-04-25 LAB — CBC
HCT: 46.4 % — ABNORMAL HIGH (ref 36.0–46.0)
Hemoglobin: 14.5 g/dL (ref 12.0–15.0)
MCH: 30.1 pg (ref 26.0–34.0)
MCHC: 31.3 g/dL (ref 30.0–36.0)
MCV: 96.3 fL (ref 80.0–100.0)
Platelets: 322 10*3/uL (ref 150–400)
RBC: 4.82 MIL/uL (ref 3.87–5.11)
RDW: 13.2 % (ref 11.5–15.5)
WBC: 12.4 10*3/uL — ABNORMAL HIGH (ref 4.0–10.5)
nRBC: 0 % (ref 0.0–0.2)

## 2018-04-25 LAB — PHOSPHORUS: Phosphorus: 3.9 mg/dL (ref 2.5–4.6)

## 2018-04-25 MED ORDER — IPRATROPIUM-ALBUTEROL 0.5-2.5 (3) MG/3ML IN SOLN
3.0000 mL | RESPIRATORY_TRACT | Status: DC | PRN
  Administered 2018-04-26: 3 mL via RESPIRATORY_TRACT
  Filled 2018-04-25: qty 3

## 2018-04-25 MED ORDER — PROCHLORPERAZINE EDISYLATE 10 MG/2ML IJ SOLN
10.0000 mg | Freq: Four times a day (QID) | INTRAMUSCULAR | Status: DC | PRN
  Administered 2018-04-26 – 2018-04-27 (×2): 10 mg via INTRAVENOUS
  Filled 2018-04-25 (×2): qty 2

## 2018-04-25 MED ORDER — HYDROMORPHONE HCL 1 MG/ML IJ SOLN
INTRAMUSCULAR | Status: AC
  Administered 2018-04-25: 03:00:00
  Filled 2018-04-25: qty 1

## 2018-04-25 MED ORDER — MAGNESIUM SULFATE 2 GM/50ML IV SOLN
2.0000 g | Freq: Once | INTRAVENOUS | Status: AC
  Administered 2018-04-25: 2 g via INTRAVENOUS
  Filled 2018-04-25: qty 50

## 2018-04-25 MED ORDER — HYDROMORPHONE HCL 1 MG/ML IJ SOLN
1.0000 mg | INTRAMUSCULAR | Status: DC | PRN
  Administered 2018-04-25 – 2018-04-27 (×9): 1 mg via INTRAVENOUS
  Filled 2018-04-25 (×9): qty 1

## 2018-04-25 MED ORDER — PROCHLORPERAZINE EDISYLATE 10 MG/2ML IJ SOLN
5.0000 mg | INTRAMUSCULAR | Status: DC | PRN
  Administered 2018-04-25: 10 mg via INTRAVENOUS
  Filled 2018-04-25: qty 2

## 2018-04-25 MED ORDER — PANTOPRAZOLE SODIUM 40 MG IV SOLR
40.0000 mg | INTRAVENOUS | Status: DC
  Administered 2018-04-25 – 2018-04-27 (×3): 40 mg via INTRAVENOUS
  Filled 2018-04-25 (×3): qty 40

## 2018-04-25 MED ORDER — MOMETASONE FURO-FORMOTEROL FUM 100-5 MCG/ACT IN AERO
2.0000 | INHALATION_SPRAY | Freq: Two times a day (BID) | RESPIRATORY_TRACT | Status: DC
  Filled 2018-04-25: qty 8.8

## 2018-04-25 NOTE — Progress Notes (Signed)
RN paged because pt's EKG showed prolonged QTc over 600. NP to floor to review EKG. No other acute findings noted.  We repeated the 12 lead and QTc already down to mid 500s. Last EKG on chart was in 2019 and QTc was in the 400s then.  NP reviewed her meds and spoke to pharmacist. She did receive 2 doses of Zofran in the ED, so we will stop that. Also, hold Albuterol (is there a better choice?). She has not been on abx or psych meds. In review of chart, she saw a Dr. for neck pain about 5 days ago and she has been taking Vicodin and Flexeril at home.   K and Ca are normal. Checking a Mg and phos. R/p EKG in am.   Pt states she has no chest pain, SOB, palpitations or dizziness.   KJKG, NP [facility]

## 2018-04-25 NOTE — Consult Note (Addendum)
Referring Provider:  Dr. Johney Maine, CCS Primary Care Physician:  [name], MD Primary Gastroenterologist:  Dr. Carlean Purl  Reason for Consultation:  SBO, history Crohn's disease  HPI: Erica [name] is a 68 y.o. 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  She's had SBO in the past as well as other abdominal surgeries include cholecystectomy, appendectomy, C-section x 3, hysterectomy, bladder suspension, ectopic pregnancy surgery.  Has required LOA in the past.  CT scan indicates that SBO likely due to adhesions (see below), no sign of active inflammation/Crohn's flare.  Recent sed rate earlier this month was normal as well.  She's had no significant Crohn's symptoms leading up to this event.  Tells me that she moves her bowels typically once or twice per day with normal stools.  Has occasional loose stools if she eats certain things so tries to avoid those items.  No dark or bloody stools.  Reports a lot of back pain and ortho pains, but no reportable abdominal pains until yesterday when these symptoms began suddenly.  Tells me she had sudden onset of abdominal pain and distention, which was followed by nausea and vomiting.  This is all typical of her previous SBO episodes as well so she knew what was happening.  Says that she had a fairly normal BM yesterday AM.  Is passing a small amount of flatus today.  CT scan abdomen and pelvis with contrast:  IMPRESSION: 1. Abnormal fluid-filled distention of small bowel loops with transition point in the right lower quadrant. Slight retraction of a short segment of small bowel in the right lower quadrant raises suspicion for small bowel obstruction secondary to adhesion. Status post right hemicolectomy. Small amount of ascites in the left upper quadrant. 2. Redemonstration of hypodense indeterminate left adrenal mass measuring 2.2 x 2.6 x 2.8  cm. 3. Status post cholecystectomy and hysterectomy. 4. Aortoiliac atherosclerosis.  Has NGT in place with green fluid in canister, is blood-tinges as well coming through the tubing.  Feeling better.  Once again, passing a small amount of flatus this AM.  Last OV with our office/Dr. Carlean Purl was 05/2017 at which time they discussed possible biologic for maintenance but that was never started (? Due to coverage).  Due for recall colonoscopy 10/2018.  Last was 10/2015 at which time she was found to have the following:  - Ileitis, consistent with Crohn's disease. Biopsied. - Patent end-to-side ileo-colonic anastomosis, characterized by inflammation. - Two 2 mm polyps in the rectum, removed with a cold biopsy forceps. Resected and retrieved. - The examination was otherwise normal on direct and retroflexion views. Personal history of colon cancer.  Pathology showed active ileitis and hyperplastic colon polyps.   Past Medical History:  Diagnosis Date  . Acute duodenitis 04/24/2017  . ANEMIA   . BACK PAIN, LUMBAR   . Cancer of ascending colon [id number] s/p colectomy 03/05/2009 02/18/2010   Qualifier: Diagnosis of  By: Carlean Purl MD, Dimas Millin   . CHF (congestive heart failure) ([place])   . Complete rotator cuff tear of left shoulder 11/27/2013   Ultrasound guided injection on November 27, 2013   . COPD (chronic obstructive pulmonary disease) with emphysema ([place])   . COPD exacerbation ([place]) 11/14/2017  . Crohn's  02/2010   ileal ulcers, intol of entercort--refuses treatment  . GERD   . History of transfusion of whole blood   . HYPERLIPIDEMIA   . HYPERTENSION   .  Microscopic hematuria    chronic  . Obstruction of intestine or colon ([facility])    adhesions  . OSTEOARTHRITIS   . Rectal fissure    Possible fissure  . Rotator cuff tear   . Takotsubo syndrome 12/2008  . URINARY INCONTINENCE     Past Surgical History:  Procedure Laterality Date  . ABDOMINAL HYSTERECTOMY  1994  . APPENDECTOMY  1964    . BLADDER SUSPENSION    . CESAREAN SECTION     x 3  . CHOLECYSTECTOMY  1995  . COLONOSCOPY W/ BIOPSIES AND POLYPECTOMY  01/24/2011   (Crohn's)ileitis, internal hemorrhoids  . ECTOPIC PREGNANCY SURGERY    . ESOPHAGOGASTRODUODENOSCOPY    . FACIAL COSMETIC SURGERY    . HAND SURGERY Bilateral 1991  . KNEE SURGERY Right 1981  . LYSIS OF ADHESION  2010   ex lap/LOA for SBO Dr Excell Seltzer  . ORBITAL FRACTURE SURGERY    . RIGHT COLECTOMY  03/2009   Right colectomy for colon CA.  Dr Donne Hazel  . TONSILLECTOMY  1952  . TOTAL KNEE ARTHROPLASTY  03/2010   Dr Alvan Dame.  Dep[place]  . UPPER GASTROINTESTINAL ENDOSCOPY  05/16/2010   normal  . UTERINE SUSPENSION    . VIDEO BRONCHOSCOPY Bilateral 12/18/2014   Procedure: VIDEO BRONCHOSCOPY WITHOUT FLUORO;  Surgeon: Tanda Rockers, MD;  Location: [facility];  Service: Cardiopulmonary;  Laterality: Bilateral;    Prior to Admission medications   Medication Sig Start Date End Date Taking? Authorizing Provider  albuterol (PROVENTIL HFA;VENTOLIN HFA) 108 (90 Base) MCG/ACT inhaler INHALE 2 PUFFS BY MOUTH EVERY 4 HOURS AS NEEDED FOR WHEEZING OR SHORTNESS OF BREATH 12/05/17  Yes [name], MD  albuterol (PROVENTIL) (2.5 MG/3ML) 0.083% nebulizer solution Take 3 mLs (2.5 mg total) by nebulization every 6 (six) hours as needed for wheezing or shortness of breath. 11/22/17  Yes [name], MD  cyclobenzaprine (FLEXERIL) 10 MG tablet Take 1 tablet (10 mg total) by mouth 3 (three) times daily as needed for muscle spasms. 11/19/17  Yes Lyndal Pulley, DO  HYDROcodone-acetaminophen (NORCO/VICODIN) 5-325 MG tablet Take 1 tablet by mouth 2 (two) times daily as needed for moderate pain (pain). 04/16/18  Yes [name], MD  metoprolol tartrate (LOPRESSOR) 25 MG tablet Take 1 tablet (25 mg total) by mouth 2 (two) times daily. 01/24/18  Yes Belva Crome, MD    Current Facility-Administered Medications  Medication Dose Route Frequency Provider Last  Rate Last Dose  . acetaminophen (TYLENOL) tablet 650 mg  650 mg Oral Q6H PRN Toy Baker, MD       Or  . acetaminophen (TYLENOL) suppository 650 mg  650 mg Rectal Q6H PRN Doutova, Anastassia, MD      . alum & mag hydroxide-simeth (MAALOX/MYLANTA) 200-200-20 MG/5ML suspension 30 mL  30 mL Oral Q6H PRN Doutova, Anastassia, MD      . bisacodyl (DULCOLAX) suppository 10 mg  10 mg Rectal Q12H PRN Doutova, Anastassia, MD      . guaiFENesin-dextromethorphan (ROBITUSSIN DM) 100-10 MG/5ML syrup 10 mL  10 mL Oral Q4H PRN Doutova, Anastassia, MD      . hydrocortisone (ANUSOL-HC) 2.5 % rectal cream 1 application  1 application Topical QID PRN Doutova, Anastassia, MD      . hydrocortisone cream 1 % 1 application  1 application Topical TID PRN Doutova, Anastassia, MD      . HYDROmorphone (DILAUDID) injection 1 mg  1 mg Intravenous Q4H PRN Kirby-Graham, Karsten Fells, NP   1 mg  at 04/25/18 0745  . lactated ringers infusion   Intravenous Continuous Eugenie Filler, MD      . lidocaine (XYLOCAINE) 2 % jelly 1 application  1 application Other Once Doutova, Anastassia, MD      . lip balm (CARMEX) ointment 1 application  1 application Topical BID Toy Baker, MD   1 application at 16/10/96 2237  . magic mouthwash  15 mL Oral QID PRN Toy Baker, MD      . magnesium sulfate IVPB 2 g 50 mL  2 g Intravenous Once Eugenie Filler, MD      . menthol-cetylpyridinium (CEPACOL) lozenge 3 mg  1 lozenge Oral PRN Toy Baker, MD      . phenol (CHLORASEPTIC) mouth spray 1-2 spray  1-2 spray Mouth/Throat PRN Doutova, Anastassia, MD      . prochlorperazine (COMPAZINE) injection 10 mg  10 mg Intravenous Q6H PRN Eugenie Filler, MD        Allergies as of 04/24/2018 - Review Complete 04/24/2018  Allergen Reaction Noted  . Morphine Nausea And Vomiting     Family History  Problem Relation Age of Onset  . Colon cancer Father 30  . Hypertension Father   . Heart disease Father   . Kidney  disease Father   . Colon cancer Sister 59  . Liver cancer Sister   . Other Sister        amyloidosis  . Throat cancer Mother   . Arthritis Other        Parent, other relative    Social History   Socioeconomic History  . Marital status: Married    Spouse name: Not on file  . Number of children: Not on file  . Years of education: Not on file  . Highest education level: Not on file  Occupational History  . Occupation: Retired     Fish farm manager: UNEMPLOYED  Social Needs  . Financial resource strain: Not hard at all  . Food insecurity:    Worry: Never true    Inability: Never true  . Transportation needs:    Medical: No    Non-medical: No  Tobacco Use  . Smoking status: Current Every Day Smoker    Packs/day: 1.00    Years: 50.00    Pack years: 50.00    Types: Cigarettes  . Smokeless tobacco: Never Used  Substance and Sexual Activity  . Alcohol use: No    Alcohol/week: 0.0 standard drinks  . Drug use: No  . Sexual activity: Not on file  Lifestyle  . Physical activity:    Days per week: 7 days    Minutes per session: Patient refused  . Stress: Not at all  Relationships  . Social connections:    Talks on phone: More than three times a week    Gets together: More than three times a week    Attends religious service: More than 4 times per year    Active member of club or organization: Yes    Attends meetings of clubs or organizations: More than 4 times per year    Relationship status: Married  . Intimate partner violence:    Fear of current or ex partner: Not on file    Emotionally abused: Not on file    Physically abused: Not on file    Forced sexual activity: Not on file  Other Topics Concern  . Not on file  Social History Narrative   Lives with husband and 2 grand dtr; 9 and 58  Review of Systems: ROS is O/W negative except as mentioned in HPI.  Physical Exam: Vital signs in last 24 hours: Temp:  [97.5 F (36.4 C)-98.6 F (37 C)] 98.6 F (37 C) (01/23  0547) Pulse Rate:  [71-101] 101 (01/23 0547) Resp:  [16-22] 16 (01/23 0547) BP: (100-142)/(67-96) 141/85 (01/23 0547) SpO2:  [90 %-99 %] 93 % (01/23 0547) Weight:  [58.5 kg] 58.5 kg (01/22 1638) Last BM Date: 04/23/18 General:  Alert, Well-developed, well-nourished, pleasant and cooperative in NAD Head:  Normocephalic and atraumatic. Eyes:  Sclera clear, no icterus.  Conjunctiva pink. Ears:  Normal auditory acuity. Mouth:  No deformity or lesions.   Lungs:  Clear throughout to auscultation.  No wheezes, crackles, or rhonchi.  Heart:  Borderline tachy with regular rhythm.  No murmurs noted. Abdomen:  Soft, non-distended.  BS present.  Mild RLQ TTP.  Previous scars noted on abdomen. Msk:  Symmetrical without gross deformities. Pulses:  Normal pulses noted. Extremities:  Without clubbing or edema. Neurologic:  Alert and oriented x 4;  grossly normal neurologically. Skin:  Intact without significant lesions or rashes. Psych:  Alert and cooperative. Normal mood and affect.  Intake/Output from previous day: 01/22 0701 - 01/23 0700 In: 600 [I.V.:550; IV Piggyback:50] Out: 700 [Urine:500; Emesis/NG output:200]  Lab Results: Recent Labs    04/24/18 1646 04/25/18 0531  WBC 13.5* 12.4*  HGB 14.5 14.5  HCT 45.1 46.4*  PLT 359 322   BMET Recent Labs    04/24/18 1646 04/25/18 0531  NA 138 138  K 4.6 4.2  CL 100 108  CO2 30 24  GLUCOSE 103* 119*  BUN 18 14  CREATININE 0.76 0.63  CALCIUM 9.7 8.7*   LFT Recent Labs    04/25/18 0531  PROT 5.8*  ALBUMIN 3.5  AST 14*  ALT 13  ALKPHOS 53  BILITOT 0.5   Studies/Results: Ct Abdomen Pelvis W Contrast  Result Date: 04/24/2018 CLINICAL DATA:  68 year old female presents with bilateral lower abdominal pain. History of multiple episodes of bowel obstruction, Crohn's ileitis, and hemicolectomy. EXAM: CT ABDOMEN AND PELVIS WITH CONTRAST TECHNIQUE: Multidetector CT imaging of the abdomen and pelvis was performed using the standard  protocol following bolus administration of intravenous contrast. CONTRAST:  161m ISOVUE-300 IOPAMIDOL (ISOVUE-300) INJECTION 61% COMPARISON:  02/14/2017 FINDINGS: Lower chest: Normal included heart size without pericardial effusion. Lung bases are unremarkable with subsegmental atelectasis in the lingula. Hepatobiliary: No focal liver abnormality is seen. Status post cholecystectomy. No biliary dilatation. Pancreas: Unremarkable. No pancreatic ductal dilatation or surrounding inflammatory changes. Spleen: Normal in size without focal abnormality. Adrenals/Urinary Tract: Redemonstration of hypodense indeterminate left adrenal mass measuring 2.2 x 2.6 x 2.8 cm. The right adrenal gland is unremarkable as are both kidneys and urinary bladder. Stomach/Bowel: Fluid-filled distention of the stomach is identified with normal small bowel rotation. Mucosal enhancement and slight fold thickening of jejunal loops with fluid-filled distention of the remainder of the small bowel are identified measuring up to 3.2 cm in caliber. Gradual tapering of small bowel is noted in the right lower quadrant tapering to site of ileocolonic anastomosis in the right hemiabdomen, series 5/21 through 27. Slight retraction noted of small bowel proximal to the area of gradual tapering, series 5/16 that may represent an adhesion accounting for early or high-grade partial SBO. Differential considerations might also include malabsorption given mucosal enhancement and fold thickening of more proximal jejunum, small bowel enteritis or ileus. Favor obstruction. Vascular/Lymphatic: Aortoiliac atherosclerosis without aneurysm or adenopathy. Reproductive: Hysterectomy. No adnexal  mass. Other: Small amount of free fluid adjacent to the spleen. Musculoskeletal: Degenerative grade 1 anterolisthesis of L4 on L5 with moderate disc flattening at L5-S1. L3 through S1 facet arthropathy. No aggressive osseous lesions or fracture. IMPRESSION: 1. Abnormal  fluid-filled distention of small bowel loops with transition point in the right lower quadrant. Slight retraction of a short segment of small bowel in the right lower quadrant raises suspicion for small bowel obstruction secondary to adhesion. Status post right hemicolectomy. Small amount of ascites in the left upper quadrant. 2. Redemonstration of hypodense indeterminate left adrenal mass measuring 2.2 x 2.6 x 2.8 cm. 3. Status post cholecystectomy and hysterectomy. 4. Aortoiliac atherosclerosis. Electronically Signed   By: Ashley Royalty M.D.   On: 04/24/2018 19:11   Dg Abd Portable 1v-small Bowel Obstruction Protocol-initial, 8 Hr Delay  Result Date: 04/25/2018 CLINICAL DATA:  Small bowel obstruction EXAM: PORTABLE ABDOMEN - 1 VIEW COMPARISON:  Yesterday FINDINGS: Oral contrast is seen within small bowel loops and stomach. Orogastric tube tip is at the level of the stomach with side port at the GE junction. Postoperative bowel, including right hemicolectomy. Contrast within the decompressed bladder. No concerning mass effect or gas collection. IMPRESSION: Ongoing small bowel obstruction with dilute contrast seen throughout mildly distended loops. No colonic contrast is noted. Electronically Signed   By: Monte Fantasia M.D.   On: 04/25/2018 06:41   Dg Abd Portable 1v-small Bowel Protocol-position Verification  Result Date: 04/24/2018 CLINICAL DATA:  Evaluate NG tube EXAM: PORTABLE ABDOMEN - 1 VIEW COMPARISON:  None. FINDINGS: The side port of the NG tube is below the GE junction with the distal tip in the gastric body. IMPRESSION: NG tube placement terminating in the stomach as above. Electronically Signed   By: Dorise Bullion III M.D   On: 04/24/2018 20:58   IMPRESSION:  *68 year old female with history of Crohn's ileitis not on treatment, s/p right hemicolectomy for hepatic flexure adenocarcinoma in 2010 who presented with SBO.  She's had SBO in the past as well as other abdominal surgeries include  cholecystectomy, appendectomy, C-section x 3, hysterectomy, bladder suspension, ectopic pregnancy surgery.  Has required LOA in the past.  CT scan indicates that SBO likely due to adhesions, no sign of active inflammation/Crohn's flare.  Recent sed rate earlier this month was normal as well.  She's had no significant Crohn's symptoms leading up to this event.  PLAN: -Would see how she does over the next 1-2 days with NGT decompression and standard SBO treatment. -Could consider trial of IV steroids to see if this opens up if it is in fact related to Crohn's ileitis but would hold off for now.  Laban Emperor. Zehr  04/25/2018, 9:40 AM    Attending physician's note   I have taken an interval history, reviewed the chart and examined the patient. I agree with the Advanced Practitioner's note, impression and recommendations.   18 yr F with history of Crohn's disease status post right hemicolectomy for adeno CA 2010, multiple abdominal surgeries and lysis of adhesions in the past.  Chronic active ileitis at ileocolonic anastomosis based on colonoscopy July 2017 Patient is currently not on any maintenance therapy for Crohn's disease.  CT scan negative for any active inflammation or long segment bowel wall thickening.    Based on CT read there was slight retraction of short segment of small bowel in the right lower quadrant suspicious for bowel adhesions.  NG tube with dark green bilious fluid output. Small amount of  blood-tinged fluid in the tubing, likely trauma from suctioning mucosa.  Will need repositioning of the tube.    Can consider IV steroids but unsure, if will change the course in the scenario. Management of SBO per surgical team  Will arrange for follow-up in GI office with Dr. Carlean Purl for management of Crohn's disease, once acute issues /SBO resolves.  Available if have any questions or concerns   K. Denzil Magnuson , MD [phone]

## 2018-04-25 NOTE — Progress Notes (Signed)
Pt states that she no longer takes the medication for which the [facility] is substituted for.  Instead, she only takes her rescue inhaler and albuterol nebulizers as needed.

## 2018-04-25 NOTE — Progress Notes (Signed)
PROGRESS NOTE    Erica [name]  QQV:[id number] DOB: 05-Nov-1950 DOA: 04/24/2018 PCP: [name], MD   Brief Narrative:  Patient is 68 year old female history of colon cancer, anemia, back pain, Crohn's disease, COPD, CHF, hyperlipidemia, hypertension, Takotsubo syndrome presenting with abdominal pain with similar feelings of prior small bowel obstruction.  Patient on Vicodin and Flexeril for chronic pain.  Patient with a history of colon cancer status post hemicolectomy.  CT abdomen and pelvis which was done was consistent with a small bowel obstruction.  NG tube placed.  General surgery consulted and following.   Assessment & Plan:   Principal Problem:   Crohn's ileitis, with intestinal obstruction ([place]) Active Problems:   Essential hypertension   GERD   Cancer of ascending colon pTispN0 s/p colectomy 03/05/2009   Tobacco abuse   Crohn disease ([place])   Narcotic dependence ([place])   Acute duodenitis   COPD (chronic obstructive pulmonary disease) with emphysema ([facility])   Adrenal mass, left ([facility])   SBO (small bowel obstruction) ([place])  #1 small bowel obstruction Likely secondary to adhesions.  Patient with a history of Crohn's and general surgery concern for Crohn's ileitis.  NG tube placed.  Small bowel obstruction protocol pending with no significant change noted this morning.  Continue IV fluids, keep potassium greater than 4, keep magnesium greater than 2.  Continue supportive care.  General surgery following and appreciate the input and recommendations.  Per general surgery note low threshold for GI evaluation due to concerns for ileitis.  GI has been consulted per ? general surgery.  2.  Hypertension BP stable.  Continue to hold home medications.  3.  Crohn's disease Chronic.  Stable.  4.  Gastroesophageal reflux disease PPI IV.  5.  Leukocytosis Likely reactive secondary to problem #1.  WBC trending down.  Patient afebrile.  No need for antibiotics at this time.   Follow.  6.  COPD Stable.  Placed on Dulera nebs.  7.  Tobacco abuse Tobacco cessation.  8.  Cancer of the ascending colon status post colectomy 03/05/2009 Stable.   DVT prophylaxis: SCDs Code Status: Full Family Communication: Updated patient and husband at bedside. Disposition Plan: Likely home once small bowel obstruction is resolved and per general surgery.   Consultants:   General surgery: Dr. Johney Maine 04/24/2018    Procedures:   CT abdomen and pelvis 04/24/2018  Small bowel obstruction protocol 04/25/2018  Antimicrobials:   None   Subjective: Patient laying in bed.  Denies any emesis.  States may be feeling some better.  No bowel movement.  No flatus.  Objective: Vitals:   04/24/18 1938 04/24/18 2107 04/24/18 2148 04/25/18 0547  BP: 133/78 135/75 (!) 142/81 (!) 141/85  Pulse: 76 79 84 (!) 101  Resp: 20 18 16 16   Temp:   (!) 97.5 F (36.4 C) 98.6 F (37 C)  TempSrc:   Oral Oral  SpO2: 99% 93% 95% 93%  Weight:      Height:        Intake/Output Summary (Last 24 hours) at 04/25/2018 1319 Last data filed at 04/25/2018 1046 Gross per 24 hour  Intake 600 ml  Output 700 ml  Net -100 ml   Filed Weights   04/24/18 1638  Weight: 58.5 kg    Examination:  General exam: NAD.  NG tube in place. Respiratory system: CTAB.  No wheezes, no crackles, no rhonchi.  Normal respiratory effort.  Cardiovascular system: RRR no murmurs rubs or gallops.  No JVD.  No lower extremity  edema. Gastrointestinal system: Abdomen is nondistended, soft and some tenderness to palpation in the lower abdomen.  Hypoactive bowel sounds.  Central nervous system: Alert and oriented. No focal neurological deficits. Extremities: Symmetric 5 x 5 power. Skin: No rashes, lesions or ulcers Psychiatry: Judgement and insight appear normal. Mood & affect appropriate.     Data Reviewed: I have personally reviewed following labs and imaging studies  CBC: Recent Labs  Lab 04/24/18 1646  04/25/18 0531  WBC 13.5* 12.4*  HGB 14.5 14.5  HCT 45.1 46.4*  MCV 95.6 96.3  PLT 359 166   Basic Metabolic Panel: Recent Labs  Lab 04/24/18 1646 04/25/18 0531  NA 138 138  K 4.6 4.2  CL 100 108  CO2 30 24  GLUCOSE 103* 119*  BUN 18 14  CREATININE 0.76 0.63  CALCIUM 9.7 8.7*  MG  --  1.9  PHOS  --  3.9   GFR: Estimated Creatinine Clearance: 53.2 mL/min (by C-G formula based on SCr of 0.63 mg/dL). Liver Function Tests: Recent Labs  Lab 04/24/18 1646 04/25/18 0531  AST 17 14*  ALT 14 13  ALKPHOS 59 53  BILITOT 1.0 0.5  PROT 7.1 5.8*  ALBUMIN 4.5 3.5   Recent Labs  Lab 04/24/18 1646  LIPASE 33   No results for input(s): AMMONIA in the last 168 hours. Coagulation Profile: No results for input(s): INR, PROTIME in the last 168 hours. Cardiac Enzymes: No results for input(s): CKTOTAL, CKMB, CKMBINDEX, TROPONINI in the last 168 hours. BNP (last 3 results) No results for input(s): PROBNP in the last 8760 hours. HbA1C: No results for input(s): HGBA1C in the last 72 hours. CBG: No results for input(s): GLUCAP in the last 168 hours. Lipid Profile: No results for input(s): CHOL, HDL, LDLCALC, TRIG, CHOLHDL, LDLDIRECT in the last 72 hours. Thyroid Function Tests: Recent Labs    04/25/18 0531  TSH 0.443   Anemia Panel: No results for input(s): VITAMINB12, FOLATE, FERRITIN, TIBC, IRON, RETICCTPCT in the last 72 hours. Sepsis Labs: Recent Labs  Lab 04/24/18 1755  LATICACIDVEN 1.2    No results found for this or any previous visit (from the past 240 hour(s)).       Radiology Studies: Ct Abdomen Pelvis W Contrast  Result Date: 04/24/2018 CLINICAL DATA:  68 year old female presents with bilateral lower abdominal pain. History of multiple episodes of bowel obstruction, Crohn's ileitis, and hemicolectomy. EXAM: CT ABDOMEN AND PELVIS WITH CONTRAST TECHNIQUE: Multidetector CT imaging of the abdomen and pelvis was performed using the standard protocol following  bolus administration of intravenous contrast. CONTRAST:  119m ISOVUE-300 IOPAMIDOL (ISOVUE-300) INJECTION 61% COMPARISON:  02/14/2017 FINDINGS: Lower chest: Normal included heart size without pericardial effusion. Lung bases are unremarkable with subsegmental atelectasis in the lingula. Hepatobiliary: No focal liver abnormality is seen. Status post cholecystectomy. No biliary dilatation. Pancreas: Unremarkable. No pancreatic ductal dilatation or surrounding inflammatory changes. Spleen: Normal in size without focal abnormality. Adrenals/Urinary Tract: Redemonstration of hypodense indeterminate left adrenal mass measuring 2.2 x 2.6 x 2.8 cm. The right adrenal gland is unremarkable as are both kidneys and urinary bladder. Stomach/Bowel: Fluid-filled distention of the stomach is identified with normal small bowel rotation. Mucosal enhancement and slight fold thickening of jejunal loops with fluid-filled distention of the remainder of the small bowel are identified measuring up to 3.2 cm in caliber. Gradual tapering of small bowel is noted in the right lower quadrant tapering to site of ileocolonic anastomosis in the right hemiabdomen, series 5/21 through 27. Slight retraction noted  of small bowel proximal to the area of gradual tapering, series 5/16 that may represent an adhesion accounting for early or high-grade partial SBO. Differential considerations might also include malabsorption given mucosal enhancement and fold thickening of more proximal jejunum, small bowel enteritis or ileus. Favor obstruction. Vascular/Lymphatic: Aortoiliac atherosclerosis without aneurysm or adenopathy. Reproductive: Hysterectomy. No adnexal mass. Other: Small amount of free fluid adjacent to the spleen. Musculoskeletal: Degenerative grade 1 anterolisthesis of L4 on L5 with moderate disc flattening at L5-S1. L3 through S1 facet arthropathy. No aggressive osseous lesions or fracture. IMPRESSION: 1. Abnormal fluid-filled distention of  small bowel loops with transition point in the right lower quadrant. Slight retraction of a short segment of small bowel in the right lower quadrant raises suspicion for small bowel obstruction secondary to adhesion. Status post right hemicolectomy. Small amount of ascites in the left upper quadrant. 2. Redemonstration of hypodense indeterminate left adrenal mass measuring 2.2 x 2.6 x 2.8 cm. 3. Status post cholecystectomy and hysterectomy. 4. Aortoiliac atherosclerosis. Electronically Signed   By: Ashley Royalty M.D.   On: 04/24/2018 19:11   Dg Abd Portable 1v-small Bowel Obstruction Protocol-initial, 8 Hr Delay  Result Date: 04/25/2018 CLINICAL DATA:  Small bowel obstruction EXAM: PORTABLE ABDOMEN - 1 VIEW COMPARISON:  Yesterday FINDINGS: Oral contrast is seen within small bowel loops and stomach. Orogastric tube tip is at the level of the stomach with side port at the GE junction. Postoperative bowel, including right hemicolectomy. Contrast within the decompressed bladder. No concerning mass effect or gas collection. IMPRESSION: Ongoing small bowel obstruction with dilute contrast seen throughout mildly distended loops. No colonic contrast is noted. Electronically Signed   By: Monte Fantasia M.D.   On: 04/25/2018 06:41   Dg Abd Portable 1v-small Bowel Protocol-position Verification  Result Date: 04/24/2018 CLINICAL DATA:  Evaluate NG tube EXAM: PORTABLE ABDOMEN - 1 VIEW COMPARISON:  None. FINDINGS: The side port of the NG tube is below the GE junction with the distal tip in the gastric body. IMPRESSION: NG tube placement terminating in the stomach as above. Electronically Signed   By: Dorise Bullion III M.D   On: 04/24/2018 20:58        Scheduled Meds: . lidocaine  1 application Other Once  . lip balm  1 application Topical BID   Continuous Infusions: . lactated ringers       LOS: 1 day    Time spent: 35 minutes    Irine Seal, MD [facility]  If 7PM-7AM, please contact  night-coverage [email or web address] 04/25/2018, 1:19 PM

## 2018-04-25 NOTE — Progress Notes (Signed)
Patient ID: Erica [name], female   DOB: Apr 06, 1950, 68 y.o.   MRN: [id number]       Subjective: CC: Abdominal Pain Pain has improved overnight, still greater in the lower abdomen. Feels mildly distended. No nausea. Denies flatus or BM. Mobilizing to bathroom. Using IS.   Objective: Vital signs in last 24 hours: Temp:  [97.5 F (36.4 C)-98.6 F (37 C)] 98.6 F (37 C) (01/23 0547) Pulse Rate:  [71-101] 101 (01/23 0547) Resp:  [16-22] 16 (01/23 0547) BP: (100-142)/(67-96) 141/85 (01/23 0547) SpO2:  [90 %-99 %] 93 % (01/23 0547) Weight:  [58.5 kg] 58.5 kg (01/22 1638) Last BM Date: 04/23/18  Intake/Output from previous day: 01/22 0701 - 01/23 0700 In: 600 [I.V.:550; IV Piggyback:50] Out: 700 [Urine:500; Emesis/NG output:200] Intake/Output this shift: No intake/output data recorded.  PE: Gen: Awake and alert. Pleasant  Heart: RRR Lungs: CTA b/l Abd: Soft, mildly distended. Hypoactive bowel sounds. Generalized tenderness > in the lower abdomen. No focal tenderness. No r/r/g. 400cc yellow fluid in NG cannister. NG tube not functioning. MSK: No edema.    Lab Results:  Recent Labs    04/24/18 1646 04/25/18 0531  WBC 13.5* 12.4*  HGB 14.5 14.5  HCT 45.1 46.4*  PLT 359 322   BMET Recent Labs    04/24/18 1646 04/25/18 0531  NA 138 138  K 4.6 4.2  CL 100 108  CO2 30 24  GLUCOSE 103* 119*  BUN 18 14  CREATININE 0.76 0.63  CALCIUM 9.7 8.7*   PT/INR No results for input(s): LABPROT, INR in the last 72 hours. CMP     Component Value Date/Time   NA 138 04/25/2018 0531   K 4.2 04/25/2018 0531   CL 108 04/25/2018 0531   CO2 24 04/25/2018 0531   GLUCOSE 119 (H) 04/25/2018 0531   BUN 14 04/25/2018 0531   CREATININE 0.63 04/25/2018 0531   CALCIUM 8.7 (L) 04/25/2018 0531   PROT 5.8 (L) 04/25/2018 0531   ALBUMIN 3.5 04/25/2018 0531   AST 14 (L) 04/25/2018 0531   ALT 13 04/25/2018 0531   ALKPHOS 53 04/25/2018 0531   BILITOT 0.5 04/25/2018 0531   GFRNONAA >60  04/25/2018 0531   GFRAA >60 04/25/2018 0531   Lipase     Component Value Date/Time   LIPASE 33 04/24/2018 1646       Studies/Results: Ct Abdomen Pelvis W Contrast  Result Date: 04/24/2018 CLINICAL DATA:  68 year old female presents with bilateral lower abdominal pain. History of multiple episodes of bowel obstruction, Crohn's ileitis, and hemicolectomy. EXAM: CT ABDOMEN AND PELVIS WITH CONTRAST TECHNIQUE: Multidetector CT imaging of the abdomen and pelvis was performed using the standard protocol following bolus administration of intravenous contrast. CONTRAST:  171m ISOVUE-300 IOPAMIDOL (ISOVUE-300) INJECTION 61% COMPARISON:  02/14/2017 FINDINGS: Lower chest: Normal included heart size without pericardial effusion. Lung bases are unremarkable with subsegmental atelectasis in the lingula. Hepatobiliary: No focal liver abnormality is seen. Status post cholecystectomy. No biliary dilatation. Pancreas: Unremarkable. No pancreatic ductal dilatation or surrounding inflammatory changes. Spleen: Normal in size without focal abnormality. Adrenals/Urinary Tract: Redemonstration of hypodense indeterminate left adrenal mass measuring 2.2 x 2.6 x 2.8 cm. The right adrenal gland is unremarkable as are both kidneys and urinary bladder. Stomach/Bowel: Fluid-filled distention of the stomach is identified with normal small bowel rotation. Mucosal enhancement and slight fold thickening of jejunal loops with fluid-filled distention of the remainder of the small bowel are identified measuring up to 3.2 cm in caliber. Gradual tapering of small bowel  is noted in the right lower quadrant tapering to site of ileocolonic anastomosis in the right hemiabdomen, series 5/21 through 27. Slight retraction noted of small bowel proximal to the area of gradual tapering, series 5/16 that may represent an adhesion accounting for early or high-grade partial SBO. Differential considerations might also include malabsorption given mucosal  enhancement and fold thickening of more proximal jejunum, small bowel enteritis or ileus. Favor obstruction. Vascular/Lymphatic: Aortoiliac atherosclerosis without aneurysm or adenopathy. Reproductive: Hysterectomy. No adnexal mass. Other: Small amount of free fluid adjacent to the spleen. Musculoskeletal: Degenerative grade 1 anterolisthesis of L4 on L5 with moderate disc flattening at L5-S1. L3 through S1 facet arthropathy. No aggressive osseous lesions or fracture. IMPRESSION: 1. Abnormal fluid-filled distention of small bowel loops with transition point in the right lower quadrant. Slight retraction of a short segment of small bowel in the right lower quadrant raises suspicion for small bowel obstruction secondary to adhesion. Status post right hemicolectomy. Small amount of ascites in the left upper quadrant. 2. Redemonstration of hypodense indeterminate left adrenal mass measuring 2.2 x 2.6 x 2.8 cm. 3. Status post cholecystectomy and hysterectomy. 4. Aortoiliac atherosclerosis. Electronically Signed   By: Ashley Royalty M.D.   On: 04/24/2018 19:11   Dg Abd Portable 1v-small Bowel Obstruction Protocol-initial, 8 Hr Delay  Result Date: 04/25/2018 CLINICAL DATA:  Small bowel obstruction EXAM: PORTABLE ABDOMEN - 1 VIEW COMPARISON:  Yesterday FINDINGS: Oral contrast is seen within small bowel loops and stomach. Orogastric tube tip is at the level of the stomach with side port at the GE junction. Postoperative bowel, including right hemicolectomy. Contrast within the decompressed bladder. No concerning mass effect or gas collection. IMPRESSION: Ongoing small bowel obstruction with dilute contrast seen throughout mildly distended loops. No colonic contrast is noted. Electronically Signed   By: Monte Fantasia M.D.   On: 04/25/2018 06:41   Dg Abd Portable 1v-small Bowel Protocol-position Verification  Result Date: 04/24/2018 CLINICAL DATA:  Evaluate NG tube EXAM: PORTABLE ABDOMEN - 1 VIEW COMPARISON:  None.  FINDINGS: The side port of the NG tube is below the GE junction with the distal tip in the gastric body. IMPRESSION: NG tube placement terminating in the stomach as above. Electronically Signed   By: Dorise Bullion III M.D   On: 04/24/2018 20:58    Anti-infectives: Anti-infectives (From admission, onward)   None     . lactated ringers    . magnesium sulfate 1 - 4 g bolus IVPB       Assessment/Plan HTN HLD GERD COPD CHF  SBO  -Small bowel protocol -8 hr delayed film with ongoing SBO pattern. No contrast in colon.  -Repeat film in AM.  -NPO -NG tube rehooked to [employer] and IS  Hx Crohn's -GI following  -Hx ileitis  -Last colonoscopy 2017, Dr. Carlean Purl  FEN - NPO, IVF VTE - SCD, okay to give chemical prophylaxis from surgery standpoint ID - None  Plan: Repeat films tomorrow AM. Keep NPO. Mobilize and IS   LOS: 1 day    Jillyn Ledger , [facility] 04/25/2018, 10:07 AM Pager: [phone]

## 2018-04-26 ENCOUNTER — Inpatient Hospital Stay (HOSPITAL_COMMUNITY): Payer: Medicare Other

## 2018-04-26 LAB — BASIC METABOLIC PANEL
Anion gap: 8 (ref 5–15)
BUN: 13 mg/dL (ref 8–23)
CO2: 27 mmol/L (ref 22–32)
Calcium: 8.7 mg/dL — ABNORMAL LOW (ref 8.9–10.3)
Chloride: 107 mmol/L (ref 98–111)
Creatinine, Ser: 0.65 mg/dL (ref 0.44–1.00)
GFR calc Af Amer: >60 mL/min (ref >60)
GFR calc non Af Amer: >60 mL/min (ref >60)
Glucose, Bld: 104 mg/dL — ABNORMAL HIGH (ref 70–99)
Potassium: 3.8 mmol/L (ref 3.5–5.1)
Sodium: 142 mmol/L (ref 135–145)

## 2018-04-26 LAB — CBC
HCT: 39.6 % (ref 36.0–46.0)
Hemoglobin: 12.6 g/dL (ref 12.0–15.0)
MCH: 30.3 pg (ref 26.0–34.0)
MCHC: 31.8 g/dL (ref 30.0–36.0)
MCV: 95.2 fL (ref 80.0–100.0)
Platelets: 293 10*3/uL (ref 150–400)
RBC: 4.16 MIL/uL (ref 3.87–5.11)
RDW: 13.2 % (ref 11.5–15.5)
WBC: 8.7 10*3/uL (ref 4.0–10.5)
nRBC: 0 % (ref 0.0–0.2)

## 2018-04-26 LAB — MAGNESIUM: Magnesium: 1.9 mg/dL (ref 1.7–2.4)

## 2018-04-26 MED ORDER — POTASSIUM CHLORIDE 10 MEQ/100ML IV SOLN
10.0000 meq | INTRAVENOUS | Status: AC
  Administered 2018-04-26 (×4): 10 meq via INTRAVENOUS
  Filled 2018-04-26 (×2): qty 100

## 2018-04-26 MED ORDER — MAGNESIUM SULFATE 2 GM/50ML IV SOLN
2.0000 g | Freq: Once | INTRAVENOUS | Status: AC
  Administered 2018-04-26: 2 g via INTRAVENOUS
  Filled 2018-04-26: qty 50

## 2018-04-26 NOTE — Progress Notes (Signed)
PROGRESS NOTE    Erica [name]  DDU:[id number] DOB: 1950/07/16 DOA: 04/24/2018 PCP: [name], MD   Brief Narrative:  Patient is 68 year old female history of colon cancer, anemia, back pain, Crohn's disease, COPD, CHF, hyperlipidemia, hypertension, Takotsubo syndrome presenting with abdominal pain with similar feelings of prior small bowel obstruction.  Patient on Vicodin and Flexeril for chronic pain.  Patient with a history of colon cancer status post hemicolectomy.  CT abdomen and pelvis which was done was consistent with a small bowel obstruction.  NG tube placed.  General surgery consulted and following.   Assessment & Plan:   Principal Problem:   Crohn's ileitis, with intestinal obstruction ([place]) Active Problems:   Essential hypertension   GERD   Cancer of ascending colon [id number] s/p colectomy 03/05/2009   Tobacco abuse   Crohn disease ([place])   Narcotic dependence ([place])   Acute duodenitis   COPD (chronic obstructive pulmonary disease) with emphysema ([facility])   Adrenal mass, left ([facility])   SBO (small bowel obstruction) ([place])  #1 small bowel obstruction Likely secondary to adhesions.  Patient with a history of Crohn's and general surgery concern for Crohn's ileitis.  NG tube placed.  Patient improving clinically.  Patient states she has had a bowel movement.  Patient passing flatus.  Patient states abdominal pain has improved.  Continue supportive care.  Keep potassium greater than 4.  Keep magnesium greater than 2.  NG tube has been clamped and patient started on clears per general surgery.  Patient has been seen by gastroenterology with no further recommendations at this time recommending outpatient follow-up 1 small bowel obstruction has resolved.  General surgery following and appreciate input and recommendations.   2.  Hypertension BP stable.  Continue to hold home medications until tolerating oral intake..  3.  Crohn's disease Chronic.  Stable.  4.   Gastroesophageal reflux disease Continue PPI.   5.  Leukocytosis Likely reactive secondary to problem #1.  WBC trended down and improved.  Patient afebrile.  No need for antibiotics.  Follow.  6.  COPD Stable.  Patient states was recommended [facility] however due to side effects does not want to be on it.  Discontinue Dulera.  Nebs as needed.    7.  Tobacco abuse Tobacco cessation.  8.  Cancer of the ascending colon status post colectomy 03/05/2009 Stable.   DVT prophylaxis: SCDs Code Status: Full Family Communication: Updated patient and husband at bedside. Disposition Plan: Likely home once small bowel obstruction is resolved and per general surgery.   Consultants:   General surgery: Dr. Johney Maine 04/24/2018  Gastroenterology: Dr. Silverio Decamp 04/25/2018  Procedures:   CT abdomen and pelvis 04/24/2018  Small bowel obstruction protocol 04/25/2018  Antimicrobials:   None   Subjective: Patient laying in bed.  Patient denies any further nausea or emesis.  States abdominal pain improving.  States just had a bowel movement and passing flatus.  NG tube clamped.  Patient has been started on clears per general surgery.   Objective: Vitals:   04/25/18 2235 04/25/18 2242 04/26/18 0516 04/26/18 0648  BP: 135/84  (!) 160/75   Pulse: (!) 101 96 86   Resp: 15  16   Temp: 98.3 F (36.8 C)  98.7 F (37.1 C)   TempSrc: Oral  Oral   SpO2: 90% 93% 90% 92%  Weight:      Height:        Intake/Output Summary (Last 24 hours) at 04/26/2018 1135 Last data filed at 04/26/2018 1102 Gross per  24 hour  Intake 1490 ml  Output 2551 ml  Net -1061 ml   Filed Weights   04/24/18 1638  Weight: 58.5 kg    Examination:  General exam: NG tube clamped.  Respiratory system: Lungs clear to auscultation bilaterally.  No wheezes, no crackles, no rhonchi.  Normal respiratory effort.  Cardiovascular system: Regular rate rhythm no murmurs rubs or gallops.  No JVD.  No lower extremity edema.    Gastrointestinal system: Abdomen is soft, nondistended, positive bowel sounds, decreasing tenderness to palpation in the lower abdomen. Central nervous system: Alert and oriented. No focal neurological deficits. Extremities: Symmetric 5 x 5 power. Skin: No rashes, lesions or ulcers Psychiatry: Judgement and insight appear normal. Mood & affect appropriate.     Data Reviewed: I have personally reviewed following labs and imaging studies  CBC: Recent Labs  Lab 04/24/18 1646 04/25/18 0531 04/26/18 0445  WBC 13.5* 12.4* 8.7  HGB 14.5 14.5 12.6  HCT 45.1 46.4* 39.6  MCV 95.6 96.3 95.2  PLT 359 322 102   Basic Metabolic Panel: Recent Labs  Lab 04/24/18 1646 04/25/18 0531 04/26/18 0445  NA 138 138 142  K 4.6 4.2 3.8  CL 100 108 107  CO2 30 24 27   GLUCOSE 103* 119* 104*  BUN 18 14 13   CREATININE 0.76 0.63 0.65  CALCIUM 9.7 8.7* 8.7*  MG  --  1.9 1.9  PHOS  --  3.9  --    GFR: Estimated Creatinine Clearance: 53.2 mL/min (by C-G formula based on SCr of 0.65 mg/dL). Liver Function Tests: Recent Labs  Lab 04/24/18 1646 04/25/18 0531  AST 17 14*  ALT 14 13  ALKPHOS 59 53  BILITOT 1.0 0.5  PROT 7.1 5.8*  ALBUMIN 4.5 3.5   Recent Labs  Lab 04/24/18 1646  LIPASE 33   No results for input(s): AMMONIA in the last 168 hours. Coagulation Profile: No results for input(s): INR, PROTIME in the last 168 hours. Cardiac Enzymes: No results for input(s): CKTOTAL, CKMB, CKMBINDEX, TROPONINI in the last 168 hours. BNP (last 3 results) No results for input(s): PROBNP in the last 8760 hours. HbA1C: No results for input(s): HGBA1C in the last 72 hours. CBG: No results for input(s): GLUCAP in the last 168 hours. Lipid Profile: No results for input(s): CHOL, HDL, LDLCALC, TRIG, CHOLHDL, LDLDIRECT in the last 72 hours. Thyroid Function Tests: Recent Labs    04/25/18 0531  TSH 0.443   Anemia Panel: No results for input(s): VITAMINB12, FOLATE, FERRITIN, TIBC, IRON,  RETICCTPCT in the last 72 hours. Sepsis Labs: Recent Labs  Lab 04/24/18 1755  LATICACIDVEN 1.2    No results found for this or any previous visit (from the past 240 hour(s)).       Radiology Studies: Ct Abdomen Pelvis W Contrast  Result Date: 04/24/2018 CLINICAL DATA:  68 year old female presents with bilateral lower abdominal pain. History of multiple episodes of bowel obstruction, Crohn's ileitis, and hemicolectomy. EXAM: CT ABDOMEN AND PELVIS WITH CONTRAST TECHNIQUE: Multidetector CT imaging of the abdomen and pelvis was performed using the standard protocol following bolus administration of intravenous contrast. CONTRAST:  16m ISOVUE-300 IOPAMIDOL (ISOVUE-300) INJECTION 61% COMPARISON:  02/14/2017 FINDINGS: Lower chest: Normal included heart size without pericardial effusion. Lung bases are unremarkable with subsegmental atelectasis in the lingula. Hepatobiliary: No focal liver abnormality is seen. Status post cholecystectomy. No biliary dilatation. Pancreas: Unremarkable. No pancreatic ductal dilatation or surrounding inflammatory changes. Spleen: Normal in size without focal abnormality. Adrenals/Urinary Tract: Redemonstration of hypodense indeterminate  left adrenal mass measuring 2.2 x 2.6 x 2.8 cm. The right adrenal gland is unremarkable as are both kidneys and urinary bladder. Stomach/Bowel: Fluid-filled distention of the stomach is identified with normal small bowel rotation. Mucosal enhancement and slight fold thickening of jejunal loops with fluid-filled distention of the remainder of the small bowel are identified measuring up to 3.2 cm in caliber. Gradual tapering of small bowel is noted in the right lower quadrant tapering to site of ileocolonic anastomosis in the right hemiabdomen, series 5/21 through 27. Slight retraction noted of small bowel proximal to the area of gradual tapering, series 5/16 that may represent an adhesion accounting for early or high-grade partial SBO.  Differential considerations might also include malabsorption given mucosal enhancement and fold thickening of more proximal jejunum, small bowel enteritis or ileus. Favor obstruction. Vascular/Lymphatic: Aortoiliac atherosclerosis without aneurysm or adenopathy. Reproductive: Hysterectomy. No adnexal mass. Other: Small amount of free fluid adjacent to the spleen. Musculoskeletal: Degenerative grade 1 anterolisthesis of L4 on L5 with moderate disc flattening at L5-S1. L3 through S1 facet arthropathy. No aggressive osseous lesions or fracture. IMPRESSION: 1. Abnormal fluid-filled distention of small bowel loops with transition point in the right lower quadrant. Slight retraction of a short segment of small bowel in the right lower quadrant raises suspicion for small bowel obstruction secondary to adhesion. Status post right hemicolectomy. Small amount of ascites in the left upper quadrant. 2. Redemonstration of hypodense indeterminate left adrenal mass measuring 2.2 x 2.6 x 2.8 cm. 3. Status post cholecystectomy and hysterectomy. 4. Aortoiliac atherosclerosis. Electronically Signed   By: Ashley Royalty M.D.   On: 04/24/2018 19:11   Dg Abd Portable 1v  Result Date: 04/26/2018 CLINICAL DATA:  Small-bowel obstruction EXAM: PORTABLE ABDOMEN - 1 VIEW COMPARISON:  04/25/2018 FINDINGS: NG tube in the stomach. No dilated small bowel loops. Oral contrast throughout the colon which is nondilated appears similar to the prior study. IMPRESSION: Oral contrast in the colon. No dilated large or small bowel loops. NG tube in the stomach. Electronically Signed   By: Franchot Gallo M.D.   On: 04/26/2018 07:04   Dg Abd Portable 1v  Result Date: 04/25/2018 CLINICAL DATA:  Nasogastric position. EXAM: PORTABLE ABDOMEN - 1 VIEW COMPARISON:  Earlier same day FINDINGS: Nasogastric tube appears unchanged. The tip is in the mid body of the stomach. Side hole is at the gastroesophageal junction region. Previously administered contrast  present within the colon. Previous cholecystectomy. IMPRESSION: Nasogastric tube tip in the mid body of the stomach. Side hole at the gastroesophageal junction. Electronically Signed   By: Nelson Chimes M.D.   On: 04/25/2018 16:26   Dg Abd Portable 1v-small Bowel Obstruction Protocol-initial, 8 Hr Delay  Result Date: 04/25/2018 CLINICAL DATA:  Small bowel obstruction EXAM: PORTABLE ABDOMEN - 1 VIEW COMPARISON:  Yesterday FINDINGS: Oral contrast is seen within small bowel loops and stomach. Orogastric tube tip is at the level of the stomach with side port at the GE junction. Postoperative bowel, including right hemicolectomy. Contrast within the decompressed bladder. No concerning mass effect or gas collection. IMPRESSION: Ongoing small bowel obstruction with dilute contrast seen throughout mildly distended loops. No colonic contrast is noted. Electronically Signed   By: Monte Fantasia M.D.   On: 04/25/2018 06:41   Dg Abd Portable 1v-small Bowel Protocol-position Verification  Result Date: 04/24/2018 CLINICAL DATA:  Evaluate NG tube EXAM: PORTABLE ABDOMEN - 1 VIEW COMPARISON:  None. FINDINGS: The side port of the NG tube is below the GE junction  with the distal tip in the gastric body. IMPRESSION: NG tube placement terminating in the stomach as above. Electronically Signed   By: Dorise Bullion III M.D   On: 04/24/2018 20:58        Scheduled Meds: . lidocaine  1 application Other Once  . lip balm  1 application Topical BID  . pantoprazole (PROTONIX) IV  40 mg Intravenous Q24H   Continuous Infusions: . lactated ringers 100 mL/hr at 04/26/18 0435  . potassium chloride 10 mEq (04/26/18 1105)     LOS: 2 days    Time spent: 35 minutes    Irine Seal, MD [facility]  If 7PM-7AM, please contact night-coverage [email or web address] 04/26/2018, 11:35 AM

## 2018-04-26 NOTE — Care Management Important Message (Signed)
Important Message  Patient Details  Name: Erica [name] MRN: [id number] Date of Birth: 06-Aug-1950   Medicare Important Message Given:  Yes    Kerin Salen 04/26/2018, 10:56 AMImportant Message  Patient Details  Name: Erica [name] MRN: [id number] Date of Birth: 10-Aug-1950   Medicare Important Message Given:  Yes    Kerin Salen 04/26/2018, 10:56 AM

## 2018-04-26 NOTE — Progress Notes (Signed)
Patient ID: Erica [name], female   DOB: 1951-02-14, 68 y.o.   MRN: [id number]       Subjective: CC: Abdominal Pain Minimal lower abdominal pain this morning. Feels less distended. Some flatus this morning. No BM. Some nausea overnight, none currently. Mobilizing and using IS. 1350cc output from NG tube overnight.   Objective: Vital signs in last 24 hours: Temp:  [98.3 F (36.8 C)-98.7 F (37.1 C)] 98.7 F (37.1 C) (01/24 0516) Pulse Rate:  [86-101] 86 (01/24 0516) Resp:  [15-16] 16 (01/24 0516) BP: (135-160)/(75-84) 160/75 (01/24 0516) SpO2:  [90 %-93 %] 92 % (01/24 0648) Last BM Date: 04/23/18  Intake/Output from previous day: 01/23 0701 - 01/24 0700 In: 1250 [I.V.:1200; IV Piggyback:50] Out: 2650 [Urine:1300; Emesis/NG output:1350] Intake/Output this shift: No intake/output data recorded.  PE: Gen: Awake and alert. Pleasant  Heart: RRR Lungs: CTA b/l Abd: Soft, ND. Normoactive bowel sounds. Minimal tenderness in the lower abdomen. No focal tenderness. No r/r/g. NG tube currently clamped. MSK: No edema.   Lab Results:  Recent Labs    04/25/18 0531 04/26/18 0445  WBC 12.4* 8.7  HGB 14.5 12.6  HCT 46.4* 39.6  PLT 322 293   BMET Recent Labs    04/25/18 0531 04/26/18 0445  NA 138 142  K 4.2 3.8  CL 108 107  CO2 24 27  GLUCOSE 119* 104*  BUN 14 13  CREATININE 0.63 0.65  CALCIUM 8.7* 8.7*   PT/INR No results for input(s): LABPROT, INR in the last 72 hours. CMP     Component Value Date/Time   NA 142 04/26/2018 0445   K 3.8 04/26/2018 0445   CL 107 04/26/2018 0445   CO2 27 04/26/2018 0445   GLUCOSE 104 (H) 04/26/2018 0445   BUN 13 04/26/2018 0445   CREATININE 0.65 04/26/2018 0445   CALCIUM 8.7 (L) 04/26/2018 0445   PROT 5.8 (L) 04/25/2018 0531   ALBUMIN 3.5 04/25/2018 0531   AST 14 (L) 04/25/2018 0531   ALT 13 04/25/2018 0531   ALKPHOS 53 04/25/2018 0531   BILITOT 0.5 04/25/2018 0531   GFRNONAA >60 04/26/2018 0445   GFRAA >60 04/26/2018 0445     Lipase     Component Value Date/Time   LIPASE 33 04/24/2018 1646       Studies/Results: Ct Abdomen Pelvis W Contrast  Result Date: 04/24/2018 CLINICAL DATA:  68 year old female presents with bilateral lower abdominal pain. History of multiple episodes of bowel obstruction, Crohn's ileitis, and hemicolectomy. EXAM: CT ABDOMEN AND PELVIS WITH CONTRAST TECHNIQUE: Multidetector CT imaging of the abdomen and pelvis was performed using the standard protocol following bolus administration of intravenous contrast. CONTRAST:  175m ISOVUE-300 IOPAMIDOL (ISOVUE-300) INJECTION 61% COMPARISON:  02/14/2017 FINDINGS: Lower chest: Normal included heart size without pericardial effusion. Lung bases are unremarkable with subsegmental atelectasis in the lingula. Hepatobiliary: No focal liver abnormality is seen. Status post cholecystectomy. No biliary dilatation. Pancreas: Unremarkable. No pancreatic ductal dilatation or surrounding inflammatory changes. Spleen: Normal in size without focal abnormality. Adrenals/Urinary Tract: Redemonstration of hypodense indeterminate left adrenal mass measuring 2.2 x 2.6 x 2.8 cm. The right adrenal gland is unremarkable as are both kidneys and urinary bladder. Stomach/Bowel: Fluid-filled distention of the stomach is identified with normal small bowel rotation. Mucosal enhancement and slight fold thickening of jejunal loops with fluid-filled distention of the remainder of the small bowel are identified measuring up to 3.2 cm in caliber. Gradual tapering of small bowel is noted in the right lower quadrant tapering to site  of ileocolonic anastomosis in the right hemiabdomen, series 5/21 through 27. Slight retraction noted of small bowel proximal to the area of gradual tapering, series 5/16 that may represent an adhesion accounting for early or high-grade partial SBO. Differential considerations might also include malabsorption given mucosal enhancement and fold thickening of more  proximal jejunum, small bowel enteritis or ileus. Favor obstruction. Vascular/Lymphatic: Aortoiliac atherosclerosis without aneurysm or adenopathy. Reproductive: Hysterectomy. No adnexal mass. Other: Small amount of free fluid adjacent to the spleen. Musculoskeletal: Degenerative grade 1 anterolisthesis of L4 on L5 with moderate disc flattening at L5-S1. L3 through S1 facet arthropathy. No aggressive osseous lesions or fracture. IMPRESSION: 1. Abnormal fluid-filled distention of small bowel loops with transition point in the right lower quadrant. Slight retraction of a short segment of small bowel in the right lower quadrant raises suspicion for small bowel obstruction secondary to adhesion. Status post right hemicolectomy. Small amount of ascites in the left upper quadrant. 2. Redemonstration of hypodense indeterminate left adrenal mass measuring 2.2 x 2.6 x 2.8 cm. 3. Status post cholecystectomy and hysterectomy. 4. Aortoiliac atherosclerosis. Electronically Signed   By: Ashley Royalty M.D.   On: 04/24/2018 19:11   Dg Abd Portable 1v  Result Date: 04/26/2018 CLINICAL DATA:  Small-bowel obstruction EXAM: PORTABLE ABDOMEN - 1 VIEW COMPARISON:  04/25/2018 FINDINGS: NG tube in the stomach. No dilated small bowel loops. Oral contrast throughout the colon which is nondilated appears similar to the prior study. IMPRESSION: Oral contrast in the colon. No dilated large or small bowel loops. NG tube in the stomach. Electronically Signed   By: Franchot Gallo M.D.   On: 04/26/2018 07:04   Dg Abd Portable 1v  Result Date: 04/25/2018 CLINICAL DATA:  Nasogastric position. EXAM: PORTABLE ABDOMEN - 1 VIEW COMPARISON:  Earlier same day FINDINGS: Nasogastric tube appears unchanged. The tip is in the mid body of the stomach. Side hole is at the gastroesophageal junction region. Previously administered contrast present within the colon. Previous cholecystectomy. IMPRESSION: Nasogastric tube tip in the mid body of the stomach.  Side hole at the gastroesophageal junction. Electronically Signed   By: Nelson Chimes M.D.   On: 04/25/2018 16:26   Dg Abd Portable 1v-small Bowel Obstruction Protocol-initial, 8 Hr Delay  Result Date: 04/25/2018 CLINICAL DATA:  Small bowel obstruction EXAM: PORTABLE ABDOMEN - 1 VIEW COMPARISON:  Yesterday FINDINGS: Oral contrast is seen within small bowel loops and stomach. Orogastric tube tip is at the level of the stomach with side port at the GE junction. Postoperative bowel, including right hemicolectomy. Contrast within the decompressed bladder. No concerning mass effect or gas collection. IMPRESSION: Ongoing small bowel obstruction with dilute contrast seen throughout mildly distended loops. No colonic contrast is noted. Electronically Signed   By: Monte Fantasia M.D.   On: 04/25/2018 06:41   Dg Abd Portable 1v-small Bowel Protocol-position Verification  Result Date: 04/24/2018 CLINICAL DATA:  Evaluate NG tube EXAM: PORTABLE ABDOMEN - 1 VIEW COMPARISON:  None. FINDINGS: The side port of the NG tube is below the GE junction with the distal tip in the gastric body. IMPRESSION: NG tube placement terminating in the stomach as above. Electronically Signed   By: Dorise Bullion III M.D   On: 04/24/2018 20:58    Anti-infectives: Anti-infectives (From admission, onward)   None     . lactated ringers 100 mL/hr at 04/26/18 0435  . magnesium sulfate 1 - 4 g bolus IVPB 2 g (04/26/18 0903)  . potassium chloride  Assessment/Plan HTN HLD GERD COPD CHF  SBO  -24 hour film with oral contrast in colon -Clamp NG tube, trial of clears -Mobilize and IS  Hx Crohn's -Appreciate their input -Recommended "consider trial of IV steroids to see if this opens up if it is in fact related to Crohn's ileitis but would hold off for now" - will hold off for now -They will arrange for follow up with Dr. Carlean Purl   FEN - Trial of clears, IVF VTE - SCDs; okay to give chemical prophylaxis from surgery  standpoint ID - None  Plan: Clamp NG tube. Trial of clears.    LOS: 2 days    Jillyn Ledger , [facility] 04/26/2018, 9:03 AM Pager: [phone]

## 2018-04-27 LAB — BASIC METABOLIC PANEL
Anion gap: 7 (ref 5–15)
BUN: 6 mg/dL — ABNORMAL LOW (ref 8–23)
CO2: 25 mmol/L (ref 22–32)
Calcium: 8.7 mg/dL — ABNORMAL LOW (ref 8.9–10.3)
Chloride: 107 mmol/L (ref 98–111)
Creatinine, Ser: 0.54 mg/dL (ref 0.44–1.00)
GFR calc Af Amer: >60 mL/min (ref >60)
GFR calc non Af Amer: >60 mL/min (ref >60)
Glucose, Bld: 101 mg/dL — ABNORMAL HIGH (ref 70–99)
Potassium: 4 mmol/L (ref 3.5–5.1)
Sodium: 139 mmol/L (ref 135–145)

## 2018-04-27 LAB — CBC
HCT: 36.6 % (ref 36.0–46.0)
Hemoglobin: 11.4 g/dL — ABNORMAL LOW (ref 12.0–15.0)
MCH: 30 pg (ref 26.0–34.0)
MCHC: 31.1 g/dL (ref 30.0–36.0)
MCV: 96.3 fL (ref 80.0–100.0)
Platelets: 249 10*3/uL (ref 150–400)
RBC: 3.8 MIL/uL — ABNORMAL LOW (ref 3.87–5.11)
RDW: 12.9 % (ref 11.5–15.5)
WBC: 8.2 10*3/uL (ref 4.0–10.5)
nRBC: 0 % (ref 0.0–0.2)

## 2018-04-27 LAB — MAGNESIUM: Magnesium: 1.6 mg/dL — ABNORMAL LOW (ref 1.7–2.4)

## 2018-04-27 MED ORDER — METOPROLOL TARTRATE 25 MG PO TABS
25.0000 mg | ORAL_TABLET | Freq: Two times a day (BID) | ORAL | Status: DC
  Administered 2018-04-27 – 2018-04-28 (×2): 25 mg via ORAL
  Filled 2018-04-27 (×2): qty 1

## 2018-04-27 MED ORDER — ALBUTEROL SULFATE (2.5 MG/3ML) 0.083% IN NEBU: 2.5000 mg | INHALATION_SOLUTION | RESPIRATORY_TRACT | Status: DC | PRN

## 2018-04-27 MED ORDER — PANTOPRAZOLE SODIUM 40 MG PO TBEC
40.0000 mg | DELAYED_RELEASE_TABLET | Freq: Every day | ORAL | Status: DC
  Administered 2018-04-28: 40 mg via ORAL
  Filled 2018-04-27: qty 1

## 2018-04-27 MED ORDER — MAGNESIUM SULFATE 4 GM/100ML IV SOLN
4.0000 g | Freq: Once | INTRAVENOUS | Status: AC
  Administered 2018-04-27: 4 g via INTRAVENOUS
  Filled 2018-04-27: qty 100

## 2018-04-27 NOTE — Progress Notes (Signed)
PROGRESS NOTE    Erica [name]  IPJ:[id number] DOB: 10-16-1950 DOA: 04/24/2018 PCP: [name], MD   Brief Narrative:  Patient is 68 year old female history of colon cancer, anemia, back pain, Crohn's disease, COPD, CHF, hyperlipidemia, hypertension, Takotsubo syndrome presenting with abdominal pain with similar feelings of prior small bowel obstruction.  Patient on Vicodin and Flexeril for chronic pain.  Patient with a history of colon cancer status post hemicolectomy.  CT abdomen and pelvis which was done was consistent with a small bowel obstruction.  NG tube placed.  General surgery consulted and following.   Assessment & Plan:   Principal Problem:   Crohn's ileitis, with intestinal obstruction ([place]) Active Problems:   Essential hypertension   GERD   Cancer of ascending colon pTispN0 s/p colectomy 03/05/2009   Tobacco abuse   Crohn disease ([place])   Narcotic dependence ([place])   Acute duodenitis   COPD (chronic obstructive pulmonary disease) with emphysema (HCC)   Adrenal mass, left (HCC)   SBO (small bowel obstruction) (HCC)  1 small bowel obstruction Likely secondary to adhesions.  Patient with a history of Crohn's and general surgery concern for Crohn's ileitis.  NG tube placed.  Patient improving clinically.  Patient states she is having bowel movements and passing flatus.  Abdominal pain improved.  Tolerated clears and has been started on a full liquid diet.  NG tube has been discontinued per general surgery.  Keep potassium greater than 4.  Keep magnesium greater than 2.  General surgery following and appreciate input and recommendations.   2.  Hypertension BP stable.  Patient now on full liquid diet.  Resume home regimen of Lopressor.   3.  Crohn's disease Chronic.  Stable.  4.  Gastroesophageal reflux disease Continue PPI.   5.  Leukocytosis Likely reactive secondary to problem #1.  WBC trended down and improved.  Patient afebrile.  No need for  antibiotics.  Follow.  6.  COPD Stable.  Patient states was recommended [facility] however due to side effects does not want to be on it.  Discontinued Dulera.  Nebs as needed.    7.  Tobacco abuse Tobacco cessation.  Patient refusing nicotine patch at this time.  8.  Cancer of the ascending colon status post colectomy 03/05/2009 Stable.   DVT prophylaxis: SCDs Code Status: Full Family Communication: Updated patient.  No family at bedside.  Disposition Plan: Likely home once small bowel obstruction is resolved and per general surgery.   Consultants:   General surgery: Dr. Johney Maine 04/24/2018  Gastroenterology: Dr. Silverio Decamp 04/25/2018  Procedures:   CT abdomen and pelvis 04/24/2018  Small bowel obstruction protocol 04/25/2018  Antimicrobials:   None   Subjective: Patient sitting up on the side of the bed.  Tolerated clear liquids.  NG tube has been removed.  Denies any nausea or vomiting.  States abdominal pain has improved.  States having bowel movement and passing flatus.    Objective: Vitals:   04/26/18 1341 04/26/18 2106 04/27/18 0537 04/27/18 1312  BP: (!) 149/84 (!) 142/77 136/78 (!) 143/82  Pulse: 91 81 82 73  Resp: 16 14 17    Temp:  98.3 F (36.8 C) 98.2 F (36.8 C) 98.1 F (36.7 C)  TempSrc:  Oral Oral Oral  SpO2: 94% 91% 91% 91%  Weight:      Height:        Intake/Output Summary (Last 24 hours) at 04/27/2018 1340 Last data filed at 04/27/2018 1312 Gross per 24 hour  Intake 1760 ml  Output  2200 ml  Net -440 ml   Filed Weights   04/24/18 1638  Weight: 58.5 kg    Examination:  General exam: NAD Respiratory system: CTAB.  No wheezes, no crackles, no rhonchi.  Speaking in full sentences.  No use of accessory muscles of respiration.  Cardiovascular system: RRR no murmurs rubs or gallops.  No JVD.  No lower extremity edema.  Gastrointestinal system: Abdomen is nontender, nondistended, soft, positive bowel sounds.  No rebound.  No guarding. Central nervous  system: Alert and oriented. No focal neurological deficits. Extremities: Symmetric 5 x 5 power. Skin: No rashes, lesions or ulcers Psychiatry: Judgement and insight appear normal. Mood & affect appropriate.     Data Reviewed: I have personally reviewed following labs and imaging studies  CBC: Recent Labs  Lab 04/24/18 1646 04/25/18 0531 04/26/18 0445 04/27/18 0353  WBC 13.5* 12.4* 8.7 8.2  HGB 14.5 14.5 12.6 11.4*  HCT 45.1 46.4* 39.6 36.6  MCV 95.6 96.3 95.2 96.3  PLT 359 322 293 654   Basic Metabolic Panel: Recent Labs  Lab 04/24/18 1646 04/25/18 0531 04/26/18 0445 04/27/18 0353  NA 138 138 142 139  K 4.6 4.2 3.8 4.0  CL 100 108 107 107  CO2 30 24 27 25   GLUCOSE 103* 119* 104* 101*  BUN 18 14 13  6*  CREATININE 0.76 0.63 0.65 0.54  CALCIUM 9.7 8.7* 8.7* 8.7*  MG  --  1.9 1.9 1.6*  PHOS  --  3.9  --   --    GFR: Estimated Creatinine Clearance: 53.2 mL/min (by C-G formula based on SCr of 0.54 mg/dL). Liver Function Tests: Recent Labs  Lab 04/24/18 1646 04/25/18 0531  AST 17 14*  ALT 14 13  ALKPHOS 59 53  BILITOT 1.0 0.5  PROT 7.1 5.8*  ALBUMIN 4.5 3.5   Recent Labs  Lab 04/24/18 1646  LIPASE 33   No results for input(s): AMMONIA in the last 168 hours. Coagulation Profile: No results for input(s): INR, PROTIME in the last 168 hours. Cardiac Enzymes: No results for input(s): CKTOTAL, CKMB, CKMBINDEX, TROPONINI in the last 168 hours. BNP (last 3 results) No results for input(s): PROBNP in the last 8760 hours. HbA1C: No results for input(s): HGBA1C in the last 72 hours. CBG: No results for input(s): GLUCAP in the last 168 hours. Lipid Profile: No results for input(s): CHOL, HDL, LDLCALC, TRIG, CHOLHDL, LDLDIRECT in the last 72 hours. Thyroid Function Tests: Recent Labs    04/25/18 0531  TSH 0.443   Anemia Panel: No results for input(s): VITAMINB12, FOLATE, FERRITIN, TIBC, IRON, RETICCTPCT in the last 72 hours. Sepsis Labs: Recent Labs  Lab  04/24/18 1755  LATICACIDVEN 1.2    No results found for this or any previous visit (from the past 240 hour(s)).       Radiology Studies: Dg Abd Portable 1v  Result Date: 04/26/2018 CLINICAL DATA:  Small-bowel obstruction EXAM: PORTABLE ABDOMEN - 1 VIEW COMPARISON:  04/25/2018 FINDINGS: NG tube in the stomach. No dilated small bowel loops. Oral contrast throughout the colon which is nondilated appears similar to the prior study. IMPRESSION: Oral contrast in the colon. No dilated large or small bowel loops. NG tube in the stomach. Electronically Signed   By: Franchot Gallo M.D.   On: 04/26/2018 07:04   Dg Abd Portable 1v  Result Date: 04/25/2018 CLINICAL DATA:  Nasogastric position. EXAM: PORTABLE ABDOMEN - 1 VIEW COMPARISON:  Earlier same day FINDINGS: Nasogastric tube appears unchanged. The tip is in the  mid body of the stomach. Side hole is at the gastroesophageal junction region. Previously administered contrast present within the colon. Previous cholecystectomy. IMPRESSION: Nasogastric tube tip in the mid body of the stomach. Side hole at the gastroesophageal junction. Electronically Signed   By: Nelson Chimes M.D.   On: 04/25/2018 16:26        Scheduled Meds: . lidocaine  1 application Other Once  . lip balm  1 application Topical BID  . pantoprazole (PROTONIX) IV  40 mg Intravenous Q24H   Continuous Infusions: . lactated ringers 75 mL/hr at 04/27/18 1312     LOS: 3 days    Time spent: 35 minutes    Irine Seal, MD [facility]  If 7PM-7AM, please contact night-coverage [email or web address] 04/27/2018, 1:40 PM

## 2018-04-27 NOTE — Progress Notes (Signed)
   Subjective/Chief Complaint: Tolerated NG clamped all night Tolerated clears   Objective: Vital signs in last 24 hours: Temp:  [98.2 F (36.8 C)-98.3 F (36.8 C)] 98.2 F (36.8 C) (01/25 0537) Pulse Rate:  [81-91] 82 (01/25 0537) Resp:  [14-17] 17 (01/25 0537) BP: (136-149)/(77-84) 136/78 (01/25 0537) SpO2:  [91 %-94 %] 91 % (01/25 0537) Last BM Date: 04/27/18  Intake/Output from previous day: 01/24 0701 - 01/25 0700 In: 2330 [P.O.:680; I.V.:1200; IV Piggyback:450] Out: 1701 [Urine:1700; Stool:1] Intake/Output this shift: No intake/output data recorded.  Exam: Awake and alert Abdomen soft, non-distended Lab Results:  Recent Labs    04/26/18 0445 04/27/18 0353  WBC 8.7 8.2  HGB 12.6 11.4*  HCT 39.6 36.6  PLT 293 249   BMET Recent Labs    04/26/18 0445 04/27/18 0353  NA 142 139  K 3.8 4.0  CL 107 107  CO2 27 25  GLUCOSE 104* 101*  BUN 13 6*  CREATININE 0.65 0.54  CALCIUM 8.7* 8.7*   PT/INR No results for input(s): LABPROT, INR in the last 72 hours. ABG No results for input(s): PHART, HCO3 in the last 72 hours.  Invalid input(s): PCO2, PO2  Studies/Results: Dg Abd Portable 1v  Result Date: 04/26/2018 CLINICAL DATA:  Small-bowel obstruction EXAM: PORTABLE ABDOMEN - 1 VIEW COMPARISON:  04/25/2018 FINDINGS: NG tube in the stomach. No dilated small bowel loops. Oral contrast throughout the colon which is nondilated appears similar to the prior study. IMPRESSION: Oral contrast in the colon. No dilated large or small bowel loops. NG tube in the stomach. Electronically Signed   By: Franchot Gallo M.D.   On: 04/26/2018 07:04   Dg Abd Portable 1v  Result Date: 04/25/2018 CLINICAL DATA:  Nasogastric position. EXAM: PORTABLE ABDOMEN - 1 VIEW COMPARISON:  Earlier same day FINDINGS: Nasogastric tube appears unchanged. The tip is in the mid body of the stomach. Side hole is at the gastroesophageal junction region. Previously administered contrast present within  the colon. Previous cholecystectomy. IMPRESSION: Nasogastric tube tip in the mid body of the stomach. Side hole at the gastroesophageal junction. Electronically Signed   By: Nelson Chimes M.D.   On: 04/25/2018 16:26    Anti-infectives: Anti-infectives (From admission, onward)   None      Assessment/Plan: s/p * No surgery found *  Resolving SBO  D/c NG Advance to full liquid diet  LOS: 3 days    Coralie Keens 04/27/2018

## 2018-04-28 DIAGNOSIS — F1120 Opioid dependence, uncomplicated: Secondary | ICD-10-CM

## 2018-04-28 LAB — MAGNESIUM: Magnesium: 2 mg/dL (ref 1.7–2.4)

## 2018-04-28 LAB — CBC
HCT: 36.3 % (ref 36.0–46.0)
Hemoglobin: 11.5 g/dL — ABNORMAL LOW (ref 12.0–15.0)
MCH: 30 pg (ref 26.0–34.0)
MCHC: 31.7 g/dL (ref 30.0–36.0)
MCV: 94.8 fL (ref 80.0–100.0)
Platelets: 242 10*3/uL (ref 150–400)
RBC: 3.83 MIL/uL — ABNORMAL LOW (ref 3.87–5.11)
RDW: 12.8 % (ref 11.5–15.5)
WBC: 6.4 10*3/uL (ref 4.0–10.5)
nRBC: 0 % (ref 0.0–0.2)

## 2018-04-28 LAB — BASIC METABOLIC PANEL
Anion gap: 5 (ref 5–15)
BUN: 6 mg/dL — ABNORMAL LOW (ref 8–23)
CO2: 28 mmol/L (ref 22–32)
Calcium: 8.8 mg/dL — ABNORMAL LOW (ref 8.9–10.3)
Chloride: 107 mmol/L (ref 98–111)
Creatinine, Ser: 0.61 mg/dL (ref 0.44–1.00)
GFR calc Af Amer: >60 mL/min (ref >60)
GFR calc non Af Amer: >60 mL/min (ref >60)
Glucose, Bld: 94 mg/dL (ref 70–99)
Potassium: 4.2 mmol/L (ref 3.5–5.1)
Sodium: 140 mmol/L (ref 135–145)

## 2018-04-28 MED ORDER — SENNA 8.6 MG PO TABS: 1.0000 | ORAL_TABLET | Freq: Every day | ORAL | 0 refills | Status: DC | PRN

## 2018-04-28 MED ORDER — PANTOPRAZOLE SODIUM 40 MG PO TBEC: 40.0000 mg | DELAYED_RELEASE_TABLET | Freq: Every day | ORAL | 0 refills | Status: DC

## 2018-04-28 NOTE — Progress Notes (Signed)
   Subjective/Chief Complaint: No complaints Tolerating full liquids Having BM's   Objective: Vital signs in last 24 hours: Temp:  [97.8 F (36.6 C)-98.6 F (37 C)] 98.6 F (37 C) (01/26 0518) Pulse Rate:  [68-73] 68 (01/26 0518) Resp:  [17-18] 18 (01/26 0518) BP: (138-151)/(76-90) 138/76 (01/26 0518) SpO2:  [90 %-91 %] 90 % (01/26 0518) Last BM Date: 04/27/18  Intake/Output from previous day: 01/25 0701 - 01/26 0700 In: 1978.5 [P.O.:480; I.V.:1398.5; IV Piggyback:100] Out: 1750 [Urine:1750] Intake/Output this shift: No intake/output data recorded.  Exam: Awake and alert Comfortable Abdomen soft, NT/ND  Lab Results:  Recent Labs    04/27/18 0353 04/28/18 0359  WBC 8.2 6.4  HGB 11.4* 11.5*  HCT 36.6 36.3  PLT 249 242   BMET Recent Labs    04/27/18 0353 04/28/18 0359  NA 139 140  K 4.0 4.2  CL 107 107  CO2 25 28  GLUCOSE 101* 94  BUN 6* 6*  CREATININE 0.54 0.61  CALCIUM 8.7* 8.8*   PT/INR No results for input(s): LABPROT, INR in the last 72 hours. ABG No results for input(s): PHART, HCO3 in the last 72 hours.  Invalid input(s): PCO2, PO2  Studies/Results: No results found.  Anti-infectives: Anti-infectives (From admission, onward)   None      Assessment/Plan: s/p * No surgery found *  Resolved SBO  Soft diet Ok for discharge from surgical standpoint   LOS: 4 days    Coralie Keens 04/28/2018

## 2018-04-28 NOTE — Discharge Summary (Signed)
Physician Discharge Summary  Erica [name] GNO:[id number] DOB: 19-Jun-1950 DOA: 04/24/2018  PCP: [name], MD  Admit date: 04/24/2018 Discharge date: 04/28/2018  Time spent: 50 minutes  Recommendations for Outpatient Follow-up:  1. Follow-up with [name], MD in 2 weeks.  On follow-up patient will need a basic metabolic profile done to follow-up on electrolytes and renal function.   Discharge Diagnoses:  Principal Problem:   SBO (small bowel obstruction) (HCC) Active Problems:   Essential hypertension   GERD   Cancer of ascending colon pTispN0 s/p colectomy 03/05/2009   Tobacco abuse   Crohn disease ([place])   Narcotic dependence ([place])   Acute duodenitis   Crohn's ileitis, with intestinal obstruction ([place])   COPD (chronic obstructive pulmonary disease) with emphysema (HCC)   Adrenal mass, left ([place])   Discharge Condition: Stable and improved  Diet recommendation: Regular  Filed Weights   04/24/18 1638  Weight: 58.5 kg    History of present illness:  Per Dr. Truddie Coco is a 68 y.o. female with medical history significant of colon cancer, anemia, back pain, CHF, COPD, Crohn's disease GERD, HLD, HTN, Takotsubo syndrome In the past  Presented with abdominal pain normal bowel movement this a.m. no associated nausea vomiting pain started midday.  Feels similar to prior small bowel obstruction.  CVA and cramping like. She has history of COPD and continues to smoke has had some extra coughing recently.  Regarding pertinent Chronic problems: Continues on inhalers continues to smoke Chronic pain on Vicodin at home on Flexeril History of hypertension on metoprolol History of colon cancer is post colon surgery hemicolectomy History of Takotsubo cardiomyopathy 1694 chronic diastolic CHF last echogram done in 2015 showing improved cardiac EF from 45 up to 60 with grade 1 diastolic dysfunction  While in ER: Started to have nausea and  vomiting after arrival the following Work up has been ordered so far:  Hospital Course:  1 small bowel obstruction Likely secondary to adhesions.  Patient with a history of Crohn's and general surgery concern for Crohn's ileitis.  NG tube placed.  General surgery consulted and followed the patient throughout the hospitalization.  Patient was monitored and placed on IV fluids, supportive care.  Electrolytes were repleted.  Patient improved and initially placed on clear liquid diet.  NG tube was clamped which patient tolerated.  NG tube was subsequently discontinued after patient was passing flatus and with bowel movements.  Diet was advanced to full liquid diet which patient tolerated.  Diet subsequently advanced to a soft diet which patient tolerated.  Patient had no further nausea or emesis and no abdominal pain.  Patient was passing gas and flatus by day of discharge.  Outpatient follow-up with PCP and general surgery as needed.  2.  Hypertension BP remained stable.  Patient home regimen of Lopressor was resumed when she was started on oral diet.  Outpatient follow-up.    3.  Crohn's disease Remained stable.   4.  Gastroesophageal reflux disease Patient maintained on a PPI during the hospitalization.  Outpatient follow-up.   5.  Leukocytosis Likely reactive secondary to problem #1.  WBC trended down and leukocytosis had resolved by day of discharge.  Patient remained afebrile.  Outpatient follow-up.   6.  COPD Stable.  Patient states was recommended [facility] however due to side effects does not want to be on it.  Discontinued Dulera.  Nebs as needed.    7.  Tobacco abuse Tobacco cessation.  Patient refused nicotine patch during the  hospitalization.  Outpatient follow-up.   8.  Cancer of the ascending colon status post colectomy 03/05/2009 Stable.   Procedures:  CT abdomen and pelvis 04/24/2018  Small bowel obstruction protocol 04/25/2018  Consultations:  General surgery: Dr.  Johney Maine 04/24/2018  Gastroenterology: Dr. Silverio Decamp 04/25/2018  Discharge Exam: Vitals:   04/27/18 2112 04/28/18 0518  BP: (!) 151/90 138/76  Pulse: 69 68  Resp: 17 18  Temp: 97.8 F (36.6 C) 98.6 F (37 C)  SpO2: 91% 90%    General: NAD Cardiovascular: RRR Respiratory: CTAB  Discharge Instructions   Discharge Instructions    Diet general   Complete by:  As directed    Increase activity slowly   Complete by:  As directed      Allergies as of 04/28/2018      Reactions   Morphine Nausea And Vomiting      Medication List    TAKE these medications   albuterol (2.5 MG/3ML) 0.083% nebulizer solution Commonly known as:  PROVENTIL Take 3 mLs (2.5 mg total) by nebulization every 6 (six) hours as needed for wheezing or shortness of breath.   albuterol 108 (90 Base) MCG/ACT inhaler Commonly known as:  PROVENTIL HFA;VENTOLIN HFA INHALE 2 PUFFS BY MOUTH EVERY 4 HOURS AS NEEDED FOR WHEEZING OR SHORTNESS OF BREATH   cyclobenzaprine 10 MG tablet Commonly known as:  FLEXERIL Take 1 tablet (10 mg total) by mouth 3 (three) times daily as needed for muscle spasms.   HYDROcodone-acetaminophen 5-325 MG tablet Commonly known as:  NORCO/VICODIN Take 1 tablet by mouth 2 (two) times daily as needed for moderate pain (pain).   metoprolol tartrate 25 MG tablet Commonly known as:  LOPRESSOR Take 1 tablet (25 mg total) by mouth 2 (two) times daily.   pantoprazole 40 MG tablet Commonly known as:  PROTONIX Take 1 tablet (40 mg total) by mouth daily at 6 (six) AM. Start taking on:  April 29, 2018   senna 8.6 MG Tabs tablet Commonly known as:  SENOKOT Take 1 tablet (8.6 mg total) by mouth daily as needed for mild constipation.      Allergies  Allergen Reactions  . Morphine Nausea And Vomiting   Follow-up Information    [name], MD. Schedule an appointment as soon as possible for a visit in 2 week(s).   Specialty:  Internal Medicine Contact information: [place] [phone]            The results of significant diagnostics from this hospitalization (including imaging, microbiology, ancillary and laboratory) are listed below for reference.    Significant Diagnostic Studies: Ct Abdomen Pelvis W Contrast  Result Date: 04/24/2018 CLINICAL DATA:  68 year old female presents with bilateral lower abdominal pain. History of multiple episodes of bowel obstruction, Crohn's ileitis, and hemicolectomy. EXAM: CT ABDOMEN AND PELVIS WITH CONTRAST TECHNIQUE: Multidetector CT imaging of the abdomen and pelvis was performed using the standard protocol following bolus administration of intravenous contrast. CONTRAST:  175m ISOVUE-300 IOPAMIDOL (ISOVUE-300) INJECTION 61% COMPARISON:  02/14/2017 FINDINGS: Lower chest: Normal included heart size without pericardial effusion. Lung bases are unremarkable with subsegmental atelectasis in the lingula. Hepatobiliary: No focal liver abnormality is seen. Status post cholecystectomy. No biliary dilatation. Pancreas: Unremarkable. No pancreatic ductal dilatation or surrounding inflammatory changes. Spleen: Normal in size without focal abnormality. Adrenals/Urinary Tract: Redemonstration of hypodense indeterminate left adrenal mass measuring 2.2 x 2.6 x 2.8 cm. The right adrenal gland is unremarkable as are both kidneys and urinary bladder. Stomach/Bowel:  Fluid-filled distention of the stomach is identified with normal small bowel rotation. Mucosal enhancement and slight fold thickening of jejunal loops with fluid-filled distention of the remainder of the small bowel are identified measuring up to 3.2 cm in caliber. Gradual tapering of small bowel is noted in the right lower quadrant tapering to site of ileocolonic anastomosis in the right hemiabdomen, series 5/21 through 27. Slight retraction noted of small bowel proximal to the area of gradual tapering, series 5/16 that may represent an adhesion accounting  for early or high-grade partial SBO. Differential considerations might also include malabsorption given mucosal enhancement and fold thickening of more proximal jejunum, small bowel enteritis or ileus. Favor obstruction. Vascular/Lymphatic: Aortoiliac atherosclerosis without aneurysm or adenopathy. Reproductive: Hysterectomy. No adnexal mass. Other: Small amount of free fluid adjacent to the spleen. Musculoskeletal: Degenerative grade 1 anterolisthesis of L4 on L5 with moderate disc flattening at L5-S1. L3 through S1 facet arthropathy. No aggressive osseous lesions or fracture. IMPRESSION: 1. Abnormal fluid-filled distention of small bowel loops with transition point in the right lower quadrant. Slight retraction of a short segment of small bowel in the right lower quadrant raises suspicion for small bowel obstruction secondary to adhesion. Status post right hemicolectomy. Small amount of ascites in the left upper quadrant. 2. Redemonstration of hypodense indeterminate left adrenal mass measuring 2.2 x 2.6 x 2.8 cm. 3. Status post cholecystectomy and hysterectomy. 4. Aortoiliac atherosclerosis. Electronically Signed   By: Ashley Royalty M.D.   On: 04/24/2018 19:11   Dg Abd Portable 1v  Result Date: 04/26/2018 CLINICAL DATA:  Small-bowel obstruction EXAM: PORTABLE ABDOMEN - 1 VIEW COMPARISON:  04/25/2018 FINDINGS: NG tube in the stomach. No dilated small bowel loops. Oral contrast throughout the colon which is nondilated appears similar to the prior study. IMPRESSION: Oral contrast in the colon. No dilated large or small bowel loops. NG tube in the stomach. Electronically Signed   By: Franchot Gallo M.D.   On: 04/26/2018 07:04   Dg Abd Portable 1v  Result Date: 04/25/2018 CLINICAL DATA:  Nasogastric position. EXAM: PORTABLE ABDOMEN - 1 VIEW COMPARISON:  Earlier same day FINDINGS: Nasogastric tube appears unchanged. The tip is in the mid body of the stomach. Side hole is at the gastroesophageal junction region.  Previously administered contrast present within the colon. Previous cholecystectomy. IMPRESSION: Nasogastric tube tip in the mid body of the stomach. Side hole at the gastroesophageal junction. Electronically Signed   By: Nelson Chimes M.D.   On: 04/25/2018 16:26   Dg Abd Portable 1v-small Bowel Obstruction Protocol-initial, 8 Hr Delay  Result Date: 04/25/2018 CLINICAL DATA:  Small bowel obstruction EXAM: PORTABLE ABDOMEN - 1 VIEW COMPARISON:  Yesterday FINDINGS: Oral contrast is seen within small bowel loops and stomach. Orogastric tube tip is at the level of the stomach with side port at the GE junction. Postoperative bowel, including right hemicolectomy. Contrast within the decompressed bladder. No concerning mass effect or gas collection. IMPRESSION: Ongoing small bowel obstruction with dilute contrast seen throughout mildly distended loops. No colonic contrast is noted. Electronically Signed   By: Monte Fantasia M.D.   On: 04/25/2018 06:41   Dg Abd Portable 1v-small Bowel Protocol-position Verification  Result Date: 04/24/2018 CLINICAL DATA:  Evaluate NG tube EXAM: PORTABLE ABDOMEN - 1 VIEW COMPARISON:  None. FINDINGS: The side port of the NG tube is below the GE junction with the distal tip in the gastric body. IMPRESSION: NG tube placement terminating in the stomach as above. Electronically Signed   By: Shanon Brow  Jimmye Norman III M.D   On: 04/24/2018 20:58    Microbiology: No results found for this or any previous visit (from the past 240 hour(s)).   Labs: Basic Metabolic Panel: Recent Labs  Lab 04/24/18 1646 04/25/18 0531 04/26/18 0445 04/27/18 0353 04/28/18 0359  NA 138 138 142 139 140  K 4.6 4.2 3.8 4.0 4.2  CL 100 108 107 107 107  CO2 30 24 27 25 28   GLUCOSE 103* 119* 104* 101* 94  BUN 18 14 13  6* 6*  CREATININE 0.76 0.63 0.65 0.54 0.61  CALCIUM 9.7 8.7* 8.7* 8.7* 8.8*  MG  --  1.9 1.9 1.6* 2.0  PHOS  --  3.9  --   --   --    Liver Function Tests: Recent Labs  Lab  04/24/18 1646 04/25/18 0531  AST 17 14*  ALT 14 13  ALKPHOS 59 53  BILITOT 1.0 0.5  PROT 7.1 5.8*  ALBUMIN 4.5 3.5   Recent Labs  Lab 04/24/18 1646  LIPASE 33   No results for input(s): AMMONIA in the last 168 hours. CBC: Recent Labs  Lab 04/24/18 1646 04/25/18 0531 04/26/18 0445 04/27/18 0353 04/28/18 0359  WBC 13.5* 12.4* 8.7 8.2 6.4  HGB 14.5 14.5 12.6 11.4* 11.5*  HCT 45.1 46.4* 39.6 36.6 36.3  MCV 95.6 96.3 95.2 96.3 94.8  PLT 359 322 293 249 242   Cardiac Enzymes: No results for input(s): CKTOTAL, CKMB, CKMBINDEX, TROPONINI in the last 168 hours. BNP: BNP (last 3 results) Recent Labs    11/14/17 1715  BNP 18.1    ProBNP (last 3 results) No results for input(s): PROBNP in the last 8760 hours.  CBG: No results for input(s): GLUCAP in the last 168 hours.     Signed:  Irine Seal MD.  [facility] 04/28/2018, 5:51 PM

## 2018-04-29 ENCOUNTER — Telehealth: Payer: Self-pay | Admitting: *Deleted

## 2018-04-29 NOTE — Telephone Encounter (Signed)
Transition Care Management Follow-up Telephone Call   Date discharged? 04/28/18   How have you been since you were released from the hospital? Pt states she is doing ok   Do you understand why you were in the hospital? YES   Do you understand the discharge instructions? YES   Where were you discharged to? Home   Items Reviewed:  Medications reviewed: YES  Allergies reviewed: YES  Dietary changes reviewed: NO  Referrals reviewed: No referral recommended    Functional Questionnaire:   Activities of Daily Living (ADLs):   She states she are independent in the following: ambulation, bathing and hygiene, feeding, continence, grooming, toileting and dressing States she doesn't require assistants  Any transportation issues/concerns?: NO   Any patient concerns? NO   Confirmed importance and date/time of follow-up visits scheduled YES, appt 05/10/18  Provider Appointment booked with Dr. Sharlet Salina  Confirmed with patient if condition begins to worsen call PCP or go to the ER.  Patient was given the office number and encouraged to call back with question or concerns.  : YES

## 2018-05-10 ENCOUNTER — Other Ambulatory Visit (INDEPENDENT_AMBULATORY_CARE_PROVIDER_SITE_OTHER): Payer: Medicare Other

## 2018-05-10 ENCOUNTER — Encounter: Payer: Self-pay | Admitting: Internal Medicine

## 2018-05-10 ENCOUNTER — Ambulatory Visit (INDEPENDENT_AMBULATORY_CARE_PROVIDER_SITE_OTHER): Payer: Medicare Other | Admitting: Internal Medicine

## 2018-05-10 VITALS — BP 126/76 | HR 84 | Temp 98.3°F | Ht 62.0 in | Wt 122.0 lb

## 2018-05-10 DIAGNOSIS — K50012 Crohn's disease of small intestine with intestinal obstruction: Secondary | ICD-10-CM

## 2018-05-10 DIAGNOSIS — Z720 Tobacco use: Secondary | ICD-10-CM | POA: Diagnosis not present

## 2018-05-10 DIAGNOSIS — J439 Emphysema, unspecified: Secondary | ICD-10-CM | POA: Diagnosis not present

## 2018-05-10 LAB — COMPREHENSIVE METABOLIC PANEL
ALT: 13 U/L (ref 0–35)
AST: 16 U/L (ref 0–37)
Albumin: 4.2 g/dL (ref 3.5–5.2)
Alkaline Phosphatase: 67 U/L (ref 39–117)
BUN: 16 mg/dL (ref 6–23)
CO2: 29 mEq/L (ref 19–32)
Calcium: 9.4 mg/dL (ref 8.4–10.5)
Chloride: 99 mEq/L (ref 96–112)
Creatinine, Ser: 0.87 mg/dL (ref 0.40–1.20)
GFR: 64.72 mL/min (ref >60.00)
Glucose, Bld: 93 mg/dL (ref 70–99)
Potassium: 4.4 mEq/L (ref 3.5–5.1)
Sodium: 138 mEq/L (ref 135–145)
Total Bilirubin: 0.3 mg/dL (ref 0.2–1.2)
Total Protein: 6.8 g/dL (ref 6.0–8.3)

## 2018-05-10 LAB — CBC
HCT: 41.4 % (ref 36.0–46.0)
Hemoglobin: 13.8 g/dL (ref 12.0–15.0)
MCHC: 33.3 g/dL (ref 30.0–36.0)
MCV: 91.3 fl (ref 78.0–100.0)
Platelets: 239 10*3/uL (ref 150.0–400.0)
RBC: 4.54 Mil/uL (ref 3.87–5.11)
RDW: 13.9 % (ref 11.5–15.5)
WBC: 6.9 10*3/uL (ref 4.0–10.5)

## 2018-05-10 NOTE — Assessment & Plan Note (Signed)
She is having a viral illness. No wheezing to suggest need for steroids and lungs clear. Will advise to schedule nebulizer to help with congestion and if no improvement let us know in 3-4 days.

## 2018-05-10 NOTE — Progress Notes (Signed)
   Subjective:   Patient ID: Erica [name], female    DOB: 17-Mar-1951, 68 y.o.   MRN: [id number]  HPI The patient is a 68 YO female coming in for hospital follow up (in for SBO, treated with bowel rest, NG tube with resolution, she was able to advance diet in the hospital). She has been feeling sick since leaving the hospital. Admits to 101 temp this morning and then she took tylenol for that. She then changes story to she was having temps when released from the hospital (about 2 weeks ago) and taking tylenol since that time to make sure they did not come back. Still having some body aches since that time. No fevers in the last several days. Coughing more. Denies SOB. She has used nebulizer which helped some to open her up. She is not eating well due to this. She is drinking fluids. She is having bowel movements and flatus. Denies vomiting.   Review of Systems  Constitutional: Positive for activity change, appetite change, fatigue and unexpected weight change. Negative for fever.  HENT: Positive for congestion, postnasal drip and rhinorrhea. Negative for ear discharge, ear pain, sinus pain, sneezing, sore throat, tinnitus, trouble swallowing and voice change.   Eyes: Negative.   Respiratory: Positive for cough and chest tightness. Negative for shortness of breath and wheezing.   Cardiovascular: Negative.  Negative for chest pain, palpitations and leg swelling.  Gastrointestinal: Negative.  Negative for abdominal distention, abdominal pain, constipation, diarrhea, nausea and vomiting.  Musculoskeletal: Positive for myalgias.  Skin: Negative.   Neurological: Negative.   Psychiatric/Behavioral: Negative.     Objective:  Physical Exam Constitutional:      Appearance: She is well-developed.  HENT:     Head: Normocephalic and atraumatic.     Comments: Oropharynx with redness and clear drainage, nose with swollen turbinates, TMs normal bilaterally.  Neck:     Musculoskeletal: Normal range of  motion.     Thyroid: No thyromegaly.  Cardiovascular:     Rate and Rhythm: Normal rate and regular rhythm.  Pulmonary:     Effort: Pulmonary effort is normal. No respiratory distress.     Breath sounds: Normal breath sounds. No wheezing or rales.  Abdominal:     General: There is no distension.     Palpations: Abdomen is soft.     Tenderness: There is no abdominal tenderness. There is no rebound.     Comments: Bowel sounds slightly slow  Musculoskeletal:        General: Tenderness present.  Lymphadenopathy:     Cervical: No cervical adenopathy.  Skin:    General: Skin is warm and dry.  Neurological:     Mental Status: She is alert and oriented to person, place, and time.     Coordination: Coordination normal.     Vitals:   05/10/18 0803  BP: 126/76  Pulse: 84  Temp: 98.3 F (36.8 C)  TempSrc: Oral  SpO2: 99%  Weight: 122 lb (55.3 kg)  Height: 5' 2"  (1.575 m)    Assessment & Plan:

## 2018-05-10 NOTE — Patient Instructions (Signed)
We will check the blood work today and call you back about the results.    

## 2018-05-10 NOTE — Assessment & Plan Note (Signed)
She does not wish to try to quit smoking at this time despite current lung illness and chronic lung disease.

## 2018-05-10 NOTE — Assessment & Plan Note (Signed)
Checking CBC and CMP given poor oral intake since discharge. No signs of obstruction today.

## 2018-05-21 ENCOUNTER — Other Ambulatory Visit: Payer: Self-pay | Admitting: Internal Medicine

## 2018-05-21 NOTE — Telephone Encounter (Signed)
Called pharmacy last refill 04/16/2018 for 60 tabs  LOV: 02/22/2018 for pain management  NOV:05/24/2018 for pain management

## 2018-05-21 NOTE — Telephone Encounter (Signed)
Requested medication (s) are due for refill today: yes  Requested medication (s) are on the active medication list: yes  Last refill: 03/12/18  Future visit scheduled: yes  Notes to clinic:  Not delegated    Requested Prescriptions  Pending Prescriptions Disp Refills   HYDROcodone-acetaminophen (NORCO/VICODIN) 5-325 MG tablet 60 tablet 0    Sig: Take 1 tablet by mouth 2 (two) times daily as needed for moderate pain (pain).     Not Delegated - Analgesics:  Opioid Agonist Combinations Failed - 05/21/2018  9:06 AM      Failed - This refill cannot be delegated      Passed - Urine Drug Screen completed in last 360 days.      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

## 2018-05-21 NOTE — Telephone Encounter (Signed)
Copied from [place]. Topic: Quick Communication - Rx Refill/Question >> May 21, 2018  9:03 AM Reyne Dumas L wrote: Medication: HYDROcodone-acetaminophen (NORCO/VICODIN) 5-325 MG tablet  Has the patient contacted their pharmacy? Yes - states she needs new refill (Agent: If no, request that the patient contact the pharmacy for the refill.) (Agent: If yes, when and what did the pharmacy advise?)  Preferred Pharmacy (with phone number or street name): [place], [place] [phone] (Phone) [phone] (Fax)  Agent: Please be advised that RX refills may take up to 3 business days. We ask that you follow-up with your pharmacy.

## 2018-05-22 ENCOUNTER — Ambulatory Visit (INDEPENDENT_AMBULATORY_CARE_PROVIDER_SITE_OTHER)
Admission: RE | Admit: 2018-05-22 | Discharge: 2018-05-22 | Disposition: A | Discharge status: Home / Self Care | Payer: Medicare Other | Source: Ambulatory Visit | Attending: Acute Care | Admitting: Acute Care

## 2018-05-22 DIAGNOSIS — F17210 Nicotine dependence, cigarettes, uncomplicated: Secondary | ICD-10-CM | POA: Diagnosis not present

## 2018-05-22 DIAGNOSIS — Z122 Encounter for screening for malignant neoplasm of respiratory organs: Secondary | ICD-10-CM

## 2018-05-22 NOTE — Telephone Encounter (Signed)
Can be done at visit

## 2018-05-24 ENCOUNTER — Ambulatory Visit: Payer: Medicare Other | Admitting: Internal Medicine

## 2018-05-28 ENCOUNTER — Encounter: Payer: Self-pay | Admitting: Internal Medicine

## 2018-05-28 ENCOUNTER — Ambulatory Visit (INDEPENDENT_AMBULATORY_CARE_PROVIDER_SITE_OTHER): Payer: Medicare Other | Admitting: Internal Medicine

## 2018-05-28 ENCOUNTER — Other Ambulatory Visit: Payer: Self-pay

## 2018-05-28 VITALS — BP 112/74 | HR 78 | Temp 98.4°F | Ht 62.0 in | Wt 125.0 lb

## 2018-05-28 DIAGNOSIS — M5010 Cervical disc disorder with radiculopathy, unspecified cervical region: Secondary | ICD-10-CM | POA: Diagnosis not present

## 2018-05-28 DIAGNOSIS — J439 Emphysema, unspecified: Secondary | ICD-10-CM

## 2018-05-28 DIAGNOSIS — F1120 Opioid dependence, uncomplicated: Secondary | ICD-10-CM

## 2018-05-28 MED ORDER — ALBUTEROL SULFATE (2.5 MG/3ML) 0.083% IN NEBU: 2.5000 mg | INHALATION_SOLUTION | Freq: Four times a day (QID) | RESPIRATORY_TRACT | 1 refills | Status: DC | PRN

## 2018-05-28 MED ORDER — HYDROCODONE-ACETAMINOPHEN 5-325 MG PO TABS: 1.0000 | ORAL_TABLET | Freq: Two times a day (BID) | ORAL | 0 refills | Status: DC | PRN

## 2018-05-28 MED ORDER — METHYLPREDNISOLONE ACETATE 40 MG/ML IJ SUSP
40.0000 mg | Freq: Once | INTRAMUSCULAR | Status: AC
  Administered 2018-05-28: 40 mg via INTRAMUSCULAR

## 2018-05-28 NOTE — Assessment & Plan Note (Signed)
No early fills, [place] narcotic database reviewed and appropriate. Does have negative UDS in the past but most recent appropriate. Refill done today.

## 2018-05-28 NOTE — Progress Notes (Signed)
   Subjective:   Patient ID: Erica [name], female    DOB: 08-Mar-1951, 68 y.o.   MRN: [id number]  HPI The patient is a 68 YO female coming in for chronic pain management (taking hydrocodone BID for back pain, this helps her to be functional and allows her to cook and clean which otherwise she would not be able to do, denies new injury to back, denies taking too much medication or running out early, denies side effects of medication) and back pain (has tried many things to help with pain, does well overall, the hydrocodone BID helps her to be functional and have better QOL, denies new numbness or weakness in legs, denies new injury) and COPD (recent CT scan lung for screening done 05/23/2018 without changes or nodules, still with some scarring and COPD, she is having worsening SOB and cough in the last 2-3 weeks, thought she had a cold but it will not clear, no pneumonia on recent CT scan).   Review of Systems  Constitutional: Positive for activity change, appetite change and fatigue.  HENT: Negative.   Eyes: Negative.   Respiratory: Positive for cough and shortness of breath. Negative for chest tightness.   Cardiovascular: Negative for chest pain, palpitations and leg swelling.  Gastrointestinal: Negative for abdominal distention, abdominal pain, constipation, diarrhea, nausea and vomiting.  Musculoskeletal: Positive for arthralgias and back pain.  Skin: Negative.   Neurological: Negative.   Psychiatric/Behavioral: Negative.     Objective:  Physical Exam Constitutional:      Appearance: She is well-developed.  HENT:     Head: Normocephalic and atraumatic.  Neck:     Musculoskeletal: Normal range of motion.  Cardiovascular:     Rate and Rhythm: Normal rate and regular rhythm.  Pulmonary:     Effort: Pulmonary effort is normal. No respiratory distress.     Breath sounds: Normal breath sounds. No wheezing or rales.  Abdominal:     General: Bowel sounds are normal. There is no  distension.     Palpations: Abdomen is soft.     Tenderness: There is no abdominal tenderness. There is no rebound.  Musculoskeletal:        General: Tenderness present.  Skin:    General: Skin is warm and dry.  Neurological:     Mental Status: She is alert and oriented to person, place, and time.     Coordination: Coordination normal.     Vitals:   05/28/18 0828  BP: 112/74  Pulse: 78  Temp: 98.4 F (36.9 C)  TempSrc: Oral  SpO2: 95%  Weight: 125 lb (56.7 kg)  Height: 5' 2"  (1.575 m)    Assessment & Plan:  Depo-medrol 40 mg IM

## 2018-05-28 NOTE — Assessment & Plan Note (Signed)
Taking hydrocodone BID for pain and reviewed risk and benefit today. She admits to more benefit from QOL than risk and would like to continue. Reviewed [place] database without red flags. Last UDS appropriate.

## 2018-05-28 NOTE — Assessment & Plan Note (Signed)
Given depo-medrol 40 mg IM due to flare of symptoms. Due to recent CT scan we know that there is no consolidation so antibiotics are not needed. Continue albuterol prn. Offered maintenance medication for breathing and she declines at this time.

## 2018-05-28 NOTE — Patient Instructions (Signed)
We have given you the steroid shot today to help the breathing and cough.   We have sent in the hydrocodone and the albuterol nebulizer solution.

## 2018-05-29 ENCOUNTER — Telehealth: Payer: Self-pay | Admitting: Acute Care

## 2018-05-29 NOTE — Telephone Encounter (Signed)
LMTC x 1 to give pt CT results

## 2018-06-03 NOTE — Telephone Encounter (Signed)
Will close this message and refer to result note.

## 2018-06-06 ENCOUNTER — Encounter: Payer: Self-pay | Admitting: *Deleted

## 2018-06-06 ENCOUNTER — Other Ambulatory Visit: Payer: Self-pay | Admitting: Acute Care

## 2018-06-06 DIAGNOSIS — Z122 Encounter for screening for malignant neoplasm of respiratory organs: Secondary | ICD-10-CM

## 2018-06-06 DIAGNOSIS — F17210 Nicotine dependence, cigarettes, uncomplicated: Secondary | ICD-10-CM

## 2018-06-06 DIAGNOSIS — Z87891 Personal history of nicotine dependence: Secondary | ICD-10-CM

## 2018-06-17 ENCOUNTER — Telehealth: Payer: Self-pay | Admitting: Internal Medicine

## 2018-06-17 NOTE — Telephone Encounter (Signed)
Copied from [place] [id number]. Topic: Quick Communication - See Telephone Encounter >> Jun 17, 2018  4:01 PM Blase Mess A wrote: CRM for notification. See Telephone encounter for: 06/17/18.  Patient is calling to see if she gets other equipment from [facility]. Can face mask be ordered for her from [facility] for face mask. [phone]

## 2018-06-18 NOTE — Telephone Encounter (Signed)
Face mask?

## 2018-06-18 NOTE — Telephone Encounter (Signed)
Patient was talking about surgical masks informed her that she can call [facility] and try there as far as [place] prescribing them I dont think we do that

## 2018-06-24 ENCOUNTER — Telehealth: Payer: Self-pay | Admitting: Internal Medicine

## 2018-06-24 MED ORDER — HYDROCODONE-ACETAMINOPHEN 5-325 MG PO TABS: 1.0000 | ORAL_TABLET | Freq: Two times a day (BID) | ORAL | 0 refills | Status: DC | PRN

## 2018-06-24 NOTE — Telephone Encounter (Signed)
Addressed already

## 2018-06-24 NOTE — Telephone Encounter (Signed)
Copied from [place] #[id number]. Topic: Quick Communication - Rx Refill/Question >> Jun 24, 2018  9:31 AM Leward Quan A wrote: Medication: cyclobenzaprine (FLEXERIL) 10 MG tablet, HYDROcodone-acetaminophen (NORCO/VICODIN) 5-325 MG tablet   Has the patient contacted their pharmacy? Yes.   (Agent: If no, request that the patient contact the pharmacy for the refill.) (Agent: If yes, when and what did the pharmacy advise?)  Preferred Pharmacy (with phone number or street name): [place], [place] [phone] (Phone) [phone] (Fax)    Agent: Please be advised that RX refills may take up to 3 business days. We ask that you follow-up with your pharmacy.

## 2018-06-24 NOTE — Telephone Encounter (Signed)
Control database checked last refill: 05/28/2018  LOV: 05/28/2018 Pain management  NOV:08/27/2018

## 2018-06-25 ENCOUNTER — Other Ambulatory Visit: Payer: Self-pay | Admitting: Internal Medicine

## 2018-06-25 MED ORDER — CYCLOBENZAPRINE HCL 10 MG PO TABS: 10.0000 mg | ORAL_TABLET | Freq: Three times a day (TID) | ORAL | 0 refills | Status: DC | PRN

## 2018-06-25 NOTE — Telephone Encounter (Signed)
Copied from [place] [id number]. Topic: Quick Communication - Rx Refill/Question >> Jun 25, 2018  8:19 AM Leward Quan A wrote: Medication: cyclobenzaprine (FLEXERIL) 10 MG tablet   Has the patient contacted their pharmacy? Yes.   (Agent: If no, request that the patient contact the pharmacy for the refill.) (Agent: If yes, when and what did the pharmacy advise?)  Preferred Pharmacy (with phone number or street name): [place], [place] [phone] (Phone) [phone] (Fax)    Agent: Please be advised that RX refills may take up to 3 business days. We ask that you follow-up with your pharmacy.

## 2018-06-25 NOTE — Telephone Encounter (Signed)
Pt also would like cyclobenzaprine. [facility] on [address]

## 2018-06-27 ENCOUNTER — Telehealth: Payer: Self-pay | Admitting: Internal Medicine

## 2018-06-27 NOTE — Telephone Encounter (Signed)
Copied from [place] [id number]. Topic: Quick Communication - Rx Refill/Question >> Jun 27, 2018  1:17 PM Reyne Dumas L wrote: Medication:   Pt called and left voicemail on [place].  States that she needs to speak with nurse because nebulizer solution is only lasting for 15 days. Pt states she can be reached at [phone].

## 2018-07-01 ENCOUNTER — Ambulatory Visit: Payer: Medicare Other | Admitting: Family Medicine

## 2018-07-01 MED ORDER — ALBUTEROL SULFATE (2.5 MG/3ML) 0.083% IN NEBU: 2.5000 mg | INHALATION_SOLUTION | Freq: Four times a day (QID) | RESPIRATORY_TRACT | 1 refills | Status: DC | PRN

## 2018-07-01 NOTE — Telephone Encounter (Signed)
Sent in a 30 day supply with one refill

## 2018-07-08 ENCOUNTER — Telehealth: Payer: Self-pay | Admitting: Internal Medicine

## 2018-07-08 NOTE — Telephone Encounter (Signed)
Copied from [place] [phone] [id number]. Topic: Quick Communication - Rx Refill/Question >> Jul 08, 2018  4:22 PM Margot Ables wrote: Medication: pt stating the Dr. [name] had recommended the 1/week vit D high dose in  2018 and that she was supposed to be taking it. She is wanting to restart it. Please advise.   Has the patient contacted their pharmacy? No expired  Preferred Pharmacy (with phone number or street name): [place], [place] [phone] (Phone) [phone] (Fax)

## 2018-07-09 ENCOUNTER — Other Ambulatory Visit: Payer: Self-pay

## 2018-07-09 MED ORDER — VITAMIN D (ERGOCALCIFEROL) 1.25 MG (50000 UNIT) PO CAPS: 50000.0000 [IU] | ORAL_CAPSULE | ORAL | 0 refills | Status: DC

## 2018-07-09 NOTE — Telephone Encounter (Signed)
Spoke with patient letting her know that Dr. [name] has called in rx.

## 2018-07-22 ENCOUNTER — Ambulatory Visit (INDEPENDENT_AMBULATORY_CARE_PROVIDER_SITE_OTHER): Payer: Medicare Other | Admitting: Internal Medicine

## 2018-07-22 ENCOUNTER — Telehealth: Payer: Self-pay | Admitting: Internal Medicine

## 2018-07-22 DIAGNOSIS — F5104 Psychophysiologic insomnia: Secondary | ICD-10-CM | POA: Diagnosis not present

## 2018-07-22 MED ORDER — HYDROCODONE-ACETAMINOPHEN 5-325 MG PO TABS: 1.0000 | ORAL_TABLET | Freq: Two times a day (BID) | ORAL | 0 refills | Status: DC | PRN

## 2018-07-22 MED ORDER — LORAZEPAM 0.5 MG PO TABS: 0.5000 mg | ORAL_TABLET | Freq: Every day | ORAL | 1 refills | Status: DC

## 2018-07-22 NOTE — Progress Notes (Signed)
Virtual Visit via Video Note  I connected with Erica [name] on 07/22/18 at  4:00 PM EDT by a video enabled telemedicine application and verified that I am speaking with the correct person using two identifiers.   I discussed the limitations of evaluation and management by telemedicine and the availability of in person appointments. The patient expressed understanding and agreed to proceed.  History of Present Illness: The patient is a 68 y.o. female with visit for insomnia. Started with recent passing of father and husband in the last week. Has not been sleeping well. Some increased stress and anxiety. Denies SI/HI. Overall it is worsening. Has tried nothing for this and was not sure what she could try.   Observations/Objective: Appearance: normal, breathing appears normal, casual grooming, abdomen does not appear distended, throat normal, memory normal, mental status is A and O times 3, distressed during visit  Assessment and Plan: See problem oriented charting  Follow Up Instructions: rx for ativan short term for sleep, encouraged her in coping skills  I discussed the assessment and treatment plan with the patient. The patient was provided an opportunity to ask questions and all were answered. The patient agreed with the plan and demonstrated an understanding of the instructions.   The patient was advised to call back or seek an in-person evaluation if the symptoms worsen or if the condition fails to improve as anticipated.  [name], MD

## 2018-07-22 NOTE — Telephone Encounter (Signed)
Patient called [facility] this morning stating that her brother passed away a few days ago and that her husband passed away last night.  She is requesting a script for something to help her sleep.   Please advise.

## 2018-07-22 NOTE — Telephone Encounter (Signed)
Patient has scheduled virtual for 4pm to allow time to get back from the [facility] home this evening.

## 2018-07-22 NOTE — Telephone Encounter (Signed)
Noted thanks °

## 2018-07-22 NOTE — Telephone Encounter (Signed)
Can do virtual. We cannot do any controlled substances without a visit per law. She can try benadryl otc for sleep if she declines visit

## 2018-07-23 ENCOUNTER — Encounter: Payer: Self-pay | Admitting: Internal Medicine

## 2018-07-23 DIAGNOSIS — G4700 Insomnia, unspecified: Secondary | ICD-10-CM | POA: Insufficient documentation

## 2018-07-23 NOTE — Assessment & Plan Note (Signed)
Rx for ativan to use temporarily due to increased stressors. We talked about potential interactions with her hydrocodone as well as the risks and benefits of this and she agrees to proceed.

## 2018-07-29 ENCOUNTER — Telehealth: Payer: Self-pay | Admitting: Internal Medicine

## 2018-07-29 MED ORDER — DIAZEPAM 5 MG PO TABS: 5.0000 mg | ORAL_TABLET | Freq: Every evening | ORAL | 0 refills | Status: DC | PRN

## 2018-07-29 NOTE — Telephone Encounter (Signed)
Patient informed of MD response and stated understanding. States she is not taking the lorazepam at all.

## 2018-07-29 NOTE — Telephone Encounter (Signed)
Called patient back to find out what side effects she is having. Patient was getting nauseous and almost felt like she was hallucinating it happened both times she took it and does not want to take it again. States they did not help he sleep very much since she had those side effects. Did state again used valium the last time she had family members pass she was given 74m but that was too much and cut in half and that would help her sleep.

## 2018-07-29 NOTE — Telephone Encounter (Signed)
Have sent in the 5 mg valium. She should not take the lorazepam and valium at the same time.

## 2018-07-29 NOTE — Telephone Encounter (Signed)
Copied from [facility] [id number]. Topic: General - Inquiry >> Jul 29, 2018  8:32 AM Lionel December wrote: Reason for CRM: Pt was given a anti-depressant last week. She stated she was having side effects with this medication and needed something different. She is suggesting Valium. She has taken this in the past.

## 2018-08-01 ENCOUNTER — Ambulatory Visit: Payer: Medicare Other | Admitting: Family Medicine

## 2018-08-06 ENCOUNTER — Other Ambulatory Visit: Payer: Self-pay | Admitting: Family Medicine

## 2018-08-21 ENCOUNTER — Telehealth: Payer: Self-pay | Admitting: Internal Medicine

## 2018-08-21 DIAGNOSIS — F99 Mental disorder, not otherwise specified: Secondary | ICD-10-CM | POA: Diagnosis not present

## 2018-08-21 NOTE — Telephone Encounter (Signed)
Last OV 07/22/18 Next OV 08/27/18  [place] Controlled Substance Database checked. Last filled on  07/23/18

## 2018-08-21 NOTE — Telephone Encounter (Signed)
Copied from [place] [id number]. Topic: Quick Communication - Rx Refill/Question >> Aug 21, 2018 10:47 AM Rayann Heman wrote: Medication: HYDROcodone-acetaminophen (NORCO/VICODIN) 5-325 MG tablet [[id number]]   Has the patient contacted their pharmacy? no Preferred Pharmacy (with phone number or street name): [place], [place] [phone] (Phone) [phone] (Fax)    Agent: Please be advised that RX refills may take up to 3 business days. We ask that you follow-up with your pharmacy.

## 2018-08-22 MED ORDER — HYDROCODONE-ACETAMINOPHEN 5-325 MG PO TABS: 1.0000 | ORAL_TABLET | Freq: Two times a day (BID) | ORAL | 0 refills | Status: DC | PRN

## 2018-08-22 NOTE — Telephone Encounter (Signed)
Sent in

## 2018-08-27 ENCOUNTER — Ambulatory Visit (INDEPENDENT_AMBULATORY_CARE_PROVIDER_SITE_OTHER): Payer: Medicare Other | Admitting: Internal Medicine

## 2018-08-27 ENCOUNTER — Encounter: Payer: Self-pay | Admitting: Internal Medicine

## 2018-08-27 DIAGNOSIS — M5010 Cervical disc disorder with radiculopathy, unspecified cervical region: Secondary | ICD-10-CM

## 2018-08-27 DIAGNOSIS — F5104 Psychophysiologic insomnia: Secondary | ICD-10-CM

## 2018-08-27 DIAGNOSIS — F1120 Opioid dependence, uncomplicated: Secondary | ICD-10-CM

## 2018-08-27 NOTE — Assessment & Plan Note (Signed)
Hydrocodone 5/325 mg BID #60 per month, checked [place] narcotic database and no inappropriate fills or early fills. UDS within time frame. She is aware of the potential risks and benefits and at this time still perceives the benefit to outweigh the risk and wishes to continue.

## 2018-08-27 NOTE — Progress Notes (Signed)
Virtual Visit via Video Note  I connected with Erica [name] on 08/27/18 at  8:40 AM EDT by a video enabled telemedicine application and verified that I am speaking with the correct person using two identifiers.  The patient and the provider were at separate locations throughout the entire encounter.   I discussed the limitations of evaluation and management by telemedicine and the availability of in person appointments. The patient expressed understanding and agreed to proceed.  History of Present Illness: The patient is a 68 y.o. female with visit for follow up chronic pain (worse in the last several months due to loss of spouse and other family members as well as weather in the last week, having problems sleeping, takes max 2 hydrocodone daily, denies overuse or running out early, denies constipation or other side effects) and back pain (increased some due to weather recently, denies injury or overuse but is concerned as her spouse has passed and she may be doing some different tasks, denies falls, denies new numbness or weakness) and insomnia (did not do well with lorazepam initially and is using valium instead, using this every few nights, still having some hard times, recent loss of spouse and other relatives, denies side effects with valium).   Observations/Objective: Appearance: normal, breathing appears normal, casual grooming, abdomen does not appear distended, throat normal, mental status is A and O times 3  Assessment and Plan: See problem oriented charting  Follow Up Instructions: follow up 3 months  I discussed the assessment and treatment plan with the patient. The patient was provided an opportunity to ask questions and all were answered. The patient agreed with the plan and demonstrated an understanding of the instructions.   The patient was advised to call back or seek an in-person evaluation if the symptoms worsen or if the condition fails to improve as  anticipated.  [name], MD

## 2018-08-27 NOTE — Assessment & Plan Note (Signed)
Did not do well with lorazepam so this was switched to valium which is helping more. Denies side effects and she is aware of the risk with concurrent hydrocodone and she is accepting of risk including death or accidental overdose and is not taking concurrent times.

## 2018-08-27 NOTE — Assessment & Plan Note (Signed)
Taking hydrocodone #60 per month,  narcotic database reviewed and appropriate without early fills. UDS appropriate and within 1 year. Contract on file. She is aware of the risks and benefits and agrees with continuing therapy at this time and accepts risk.

## 2018-08-28 DIAGNOSIS — F99 Mental disorder, not otherwise specified: Secondary | ICD-10-CM | POA: Diagnosis not present

## 2018-09-02 ENCOUNTER — Telehealth: Payer: Self-pay | Admitting: Family Medicine

## 2018-09-02 ENCOUNTER — Other Ambulatory Visit: Payer: Self-pay | Admitting: Internal Medicine

## 2018-09-02 MED ORDER — FLUTICASONE PROPIONATE 50 MCG/ACT NA SUSP: 2.0000 | Freq: Every day | NASAL | 2 refills | Status: DC

## 2018-09-02 NOTE — Telephone Encounter (Signed)
Copied from [place] [id number]. Topic: Quick Communication - Rx Refill/Question >> Sep 02, 2018  9:25 AM Mathis Bud wrote: Medication: Fluticasone  Patient is currently not on medication she is requesting to get prescription due to nose being stuffy. She has taken medication in past.    Has the patient contacted their pharmacy? Requesting medication   Preferred Pharmacy (with phone number or street name): [place], [place] [phone] (Phone) [phone] (Fax)    Agent: Please be advised that RX refills may take up to 3 business days. We ask that you follow-up with your pharmacy.

## 2018-09-02 NOTE — Telephone Encounter (Signed)
Copied from [place] [id number]. Topic: Quick Communication - Rx Refill/Question >> Sep 02, 2018  9:29 AM Mathis Bud wrote: Medication: Vitamin D, Ergocalciferol, (DRISDOL) 1.25 MG (50000 UT) CAPS capsule   Has the patient contacted their pharmacy? Yes, no refills  Preferred Pharmacy (with phone number or street name): [place], [place] [phone] (Phone) [phone] (Fax)    Agent: Please be advised that RX refills may take up to 3 business days. We ask that you follow-up with your pharmacy.

## 2018-09-02 NOTE — Telephone Encounter (Signed)
Left message to call back for virtual visit.

## 2018-09-03 ENCOUNTER — Encounter: Payer: Self-pay | Admitting: Family Medicine

## 2018-09-03 ENCOUNTER — Other Ambulatory Visit: Payer: Self-pay

## 2018-09-03 ENCOUNTER — Ambulatory Visit (INDEPENDENT_AMBULATORY_CARE_PROVIDER_SITE_OTHER): Payer: Medicare Other | Admitting: Family Medicine

## 2018-09-03 DIAGNOSIS — E559 Vitamin D deficiency, unspecified: Secondary | ICD-10-CM | POA: Diagnosis not present

## 2018-09-03 DIAGNOSIS — M12811 Other specific arthropathies, not elsewhere classified, right shoulder: Secondary | ICD-10-CM

## 2018-09-03 MED ORDER — VITAMIN D (ERGOCALCIFEROL) 1.25 MG (50000 UNIT) PO CAPS: ORAL_CAPSULE | ORAL | 0 refills | Status: DC

## 2018-09-03 NOTE — Telephone Encounter (Signed)
Patient scheduled today at 11:45AM.

## 2018-09-03 NOTE — Progress Notes (Signed)
Virtual Visit via Video Note  I connected with Erica [name] on 09/03/18 at 11:45 AM EDT by a video enabled telemedicine application and verified that I am speaking with the correct person using two identifiers.  Location: Patient: Patient here in the setting Provider: I am in the office setting   I discussed the limitations of evaluation and management by telemedicine and the availability of in person appointments. The patient expressed understanding and agreed to proceed.  History of Present Illness: Patient does have known arthritic changes.  More recently has not been doing the exercises secondary to loss of her husband as well as her brother recently.  Patient has been social distancing significantly as well.  Feels like she is feeling down.  Not been eating and losing weight.  Patient does have a past medical history significant for narcotic dependence as well as Crohn's disease.  Patient has had difficulty with vitamin D and does need a refill.    Observations/Objective: Patient is a very tearful on exam.  Trying to be stoic.  Patient does speak in full sentences.  Assessment and Plan: Arthralgia   Follow Up Instructions: 4 weeks either virtually if continuing to social distance.    I discussed the assessment and treatment plan with the patient. The patient was provided an opportunity to ask questions and all were answered. The patient agreed with the plan and demonstrated an understanding of the instructions.   The patient was advised to call back or seek an in-person evaluation if the symptoms worsen or if the condition fails to improve as anticipated.  I provided 25 minutes of face-to-face time during this encounter. Discussed also about patient warning appropriately.  Feels like she is doing good but can call if she needs anything.  Discussed that patient shoulder worsening arthritic changes we will consider the possibility of injections.  Patient wants to avoid coming into  the office at this time.  Lyndal Pulley, DO

## 2018-09-03 NOTE — Assessment & Plan Note (Signed)
Last injection 5 months ago.  Can repeat if needed but patient does not want to come into the office.  Would not change any of her pain medications at the moment though.  Patient does feel that the vitamin D helps with the pain.  Follow-up again 4 weeks

## 2018-09-03 NOTE — Assessment & Plan Note (Signed)
Refill medication at this time.  Discussed posture and ergonomics.  We discussed diet changes that could be beneficial.

## 2018-09-04 DIAGNOSIS — F99 Mental disorder, not otherwise specified: Secondary | ICD-10-CM | POA: Diagnosis not present

## 2018-09-11 DIAGNOSIS — F99 Mental disorder, not otherwise specified: Secondary | ICD-10-CM | POA: Diagnosis not present

## 2018-09-18 DIAGNOSIS — F99 Mental disorder, not otherwise specified: Secondary | ICD-10-CM | POA: Diagnosis not present

## 2018-09-23 ENCOUNTER — Other Ambulatory Visit: Payer: Self-pay | Admitting: Internal Medicine

## 2018-09-23 NOTE — Telephone Encounter (Signed)
Medication: HYDROcodone-acetaminophen (NORCO/VICODIN) 5-325 MG tablet   Patient is requesting a refill of this medication.    Pharmacy:  [facility], [place] [phone] (Phone) [phone] (Fax)

## 2018-09-24 MED ORDER — HYDROCODONE-ACETAMINOPHEN 5-325 MG PO TABS: 1.0000 | ORAL_TABLET | Freq: Two times a day (BID) | ORAL | 0 refills | Status: DC | PRN

## 2018-09-24 NOTE — Telephone Encounter (Signed)
Control database checked last refill: 08/22/2018 LOV: 08/27/2018 pain management NOV: 11/27/2018

## 2018-09-25 DIAGNOSIS — F99 Mental disorder, not otherwise specified: Secondary | ICD-10-CM | POA: Diagnosis not present

## 2018-09-26 ENCOUNTER — Encounter: Payer: Self-pay | Admitting: Internal Medicine

## 2018-09-30 ENCOUNTER — Telehealth: Payer: Self-pay | Admitting: Internal Medicine

## 2018-09-30 ENCOUNTER — Other Ambulatory Visit: Payer: Self-pay

## 2018-09-30 MED ORDER — VITAMIN D (ERGOCALCIFEROL) 1.25 MG (50000 UNIT) PO CAPS: ORAL_CAPSULE | ORAL | 0 refills | Status: DC

## 2018-09-30 NOTE — Telephone Encounter (Signed)
Pt request refill  Vitamin D, Ergocalciferol, (DRISDOL) 1.25 MG (50000 UT) CAPS capsule   [address] [place] [address], [address] [phone] (Phone) [phone] (Fax)   Pt states Dr [name] prescribes this for her

## 2018-09-30 NOTE — Telephone Encounter (Signed)
Refilled through chart.

## 2018-10-02 DIAGNOSIS — F99 Mental disorder, not otherwise specified: Secondary | ICD-10-CM | POA: Diagnosis not present

## 2018-10-09 DIAGNOSIS — F99 Mental disorder, not otherwise specified: Secondary | ICD-10-CM | POA: Diagnosis not present

## 2018-10-15 DIAGNOSIS — F70 Mild intellectual disabilities: Secondary | ICD-10-CM | POA: Diagnosis not present

## 2018-10-15 DIAGNOSIS — F99 Mental disorder, not otherwise specified: Secondary | ICD-10-CM | POA: Diagnosis not present

## 2018-10-22 ENCOUNTER — Other Ambulatory Visit: Payer: Self-pay | Admitting: Internal Medicine

## 2018-10-22 ENCOUNTER — Telehealth: Payer: Self-pay | Admitting: Internal Medicine

## 2018-10-22 NOTE — Telephone Encounter (Signed)
Pt request refill   diazepam (VALIUM) 5 MG tablet  HYDROcodone-acetaminophen (NORCO/VICODIN) 5-325 MG tablet  [place], [address] [phone] (Phone) [phone] (Fax)

## 2018-10-23 ENCOUNTER — Other Ambulatory Visit: Payer: Self-pay | Admitting: Family

## 2018-10-23 MED ORDER — HYDROCODONE-ACETAMINOPHEN 5-325 MG PO TABS: 1.0000 | ORAL_TABLET | Freq: Two times a day (BID) | ORAL | 0 refills | Status: DC | PRN

## 2018-10-23 MED ORDER — DIAZEPAM 5 MG PO TABS: 5.0000 mg | ORAL_TABLET | Freq: Every evening | ORAL | 0 refills | Status: DC | PRN

## 2018-10-23 NOTE — Telephone Encounter (Signed)
Spoke with patient and info given 

## 2018-10-23 NOTE — Telephone Encounter (Signed)
Refill on Valium and Norco sent; must keep planned follow-up with Dr. Sharlet Salina for next month.

## 2018-10-29 ENCOUNTER — Other Ambulatory Visit: Payer: Self-pay

## 2018-10-29 MED ORDER — VITAMIN D (ERGOCALCIFEROL) 1.25 MG (50000 UNIT) PO CAPS: ORAL_CAPSULE | ORAL | 0 refills | Status: DC

## 2018-10-30 DIAGNOSIS — F99 Mental disorder, not otherwise specified: Secondary | ICD-10-CM | POA: Diagnosis not present

## 2018-11-06 DIAGNOSIS — F99 Mental disorder, not otherwise specified: Secondary | ICD-10-CM | POA: Diagnosis not present

## 2018-11-13 DIAGNOSIS — F99 Mental disorder, not otherwise specified: Secondary | ICD-10-CM | POA: Diagnosis not present

## 2018-11-21 ENCOUNTER — Other Ambulatory Visit: Payer: Self-pay | Admitting: Internal Medicine

## 2018-11-21 MED ORDER — HYDROCODONE-ACETAMINOPHEN 5-325 MG PO TABS: 1.0000 | ORAL_TABLET | Freq: Two times a day (BID) | ORAL | 0 refills | Status: DC | PRN

## 2018-11-21 NOTE — Telephone Encounter (Signed)
Please advise per Dr. Nathanial Millman absence. Thank you   Control database checked last refill: 10/23/2018 60 tabs LOV: 08/27/2018 NOV: 11/27/2018

## 2018-11-21 NOTE — Telephone Encounter (Signed)
Routing to CMA 

## 2018-11-21 NOTE — Telephone Encounter (Signed)
Medication Refill - Medication: HYDROcodone-acetaminophen (NORCO/VICODIN) 5-325 MG tablet  Has the patient contacted their pharmacy? Yes.   (Agent: If no, request that the patient contact the pharmacy for the refill.) (Agent: If yes, when and what did the pharmacy advise?)  Preferred Pharmacy (with phone number or street name):  [facility], [place] [phone] (Phone) [phone] (Fax)     Agent: Please be advised that RX refills may take up to 3 business days. We ask that you follow-up with your pharmacy.

## 2018-11-21 NOTE — Telephone Encounter (Signed)
Done erx 

## 2018-11-27 ENCOUNTER — Ambulatory Visit (INDEPENDENT_AMBULATORY_CARE_PROVIDER_SITE_OTHER): Payer: Medicare Other | Admitting: Internal Medicine

## 2018-11-27 DIAGNOSIS — Z720 Tobacco use: Secondary | ICD-10-CM

## 2018-11-27 DIAGNOSIS — F1120 Opioid dependence, uncomplicated: Secondary | ICD-10-CM | POA: Diagnosis not present

## 2018-11-27 DIAGNOSIS — E782 Mixed hyperlipidemia: Secondary | ICD-10-CM

## 2018-11-27 DIAGNOSIS — F99 Mental disorder, not otherwise specified: Secondary | ICD-10-CM | POA: Diagnosis not present

## 2018-11-27 DIAGNOSIS — J439 Emphysema, unspecified: Secondary | ICD-10-CM | POA: Diagnosis not present

## 2018-11-27 DIAGNOSIS — M5416 Radiculopathy, lumbar region: Secondary | ICD-10-CM | POA: Diagnosis not present

## 2018-11-27 DIAGNOSIS — F5104 Psychophysiologic insomnia: Secondary | ICD-10-CM

## 2018-11-27 NOTE — Progress Notes (Signed)
Virtual Visit via Video Note  I connected with Erica [name] on 11/27/18 at  8:40 AM EDT by a video enabled telemedicine application and verified that I am speaking with the correct person using two identifiers.  The patient and the provider were at separate locations throughout the entire encounter.   I discussed the limitations of evaluation and management by telemedicine and the availability of in person appointments. The patient expressed understanding and agreed to proceed.  History of Present Illness: The patient is a 68 y.o. female with visit for chronic pain management (taking hydrocodone BID which does help her to be functional, denies worsening pain, denies falls, overall okay and mood slightly better still dealing with loss of spouse) and back/neck pain (positive anti ccp test in the past, denies worsening swelling, denies pain worsening than usual, over hydrocodone BID is still helping her to be more functional, denies overuse or side effects) and grief (loss of husband recently, she is doing counseling, denies SI/HI, overall coping a little better, pandemic is not making this easier, using valium temporarily)  Observations/Objective: Appearance: normal, breathing appears normal, casual grooming, abdomen does not appear distended, throat normal, memory normal, mental status is A and O times 3, some emotional appropriate  Assessment and Plan: See problem oriented charting  Follow Up Instructions: refill hydrocodone when due  I discussed the assessment and treatment plan with the patient. The patient was provided an opportunity to ask questions and all were answered. The patient agreed with the plan and demonstrated an understanding of the instructions.   The patient was advised to call back or seek an in-person evaluation if the symptoms worsen or if the condition fails to improve as anticipated.  [name], MD

## 2018-11-28 ENCOUNTER — Other Ambulatory Visit: Payer: Self-pay | Admitting: *Deleted

## 2018-11-28 ENCOUNTER — Encounter: Payer: Self-pay | Admitting: Internal Medicine

## 2018-11-28 DIAGNOSIS — M159 Polyosteoarthritis, unspecified: Secondary | ICD-10-CM

## 2018-11-28 DIAGNOSIS — K50012 Crohn's disease of small intestine with intestinal obstruction: Secondary | ICD-10-CM

## 2018-11-28 DIAGNOSIS — M8949 Other hypertrophic osteoarthropathy, multiple sites: Secondary | ICD-10-CM

## 2018-11-28 DIAGNOSIS — E559 Vitamin D deficiency, unspecified: Secondary | ICD-10-CM

## 2018-11-28 DIAGNOSIS — R7989 Other specified abnormal findings of blood chemistry: Secondary | ICD-10-CM

## 2018-11-28 NOTE — Assessment & Plan Note (Signed)
Taking hydrocodone 5/325 BID and this helps her to be functional. Discussed risk of abuse/addiction and increased risk of memory problems/falls. She wishes to continue given increase in QOL. [place] narcotic database reviewed and appropriate.

## 2018-11-28 NOTE — Assessment & Plan Note (Signed)
Using valium temporarily for sleep given loss of spouse. She is aware that this should be temporary and increases her risk of adverse effect with the hydrocodone or accidental overdose and agrees with continuing therapy as is.

## 2018-11-28 NOTE — Assessment & Plan Note (Signed)
Uses hydrocodone BID per contract. Trying to be active and this helps her with QOL. Last UDS appropriate. Next UDS when safe for her to come to office given pandemic.

## 2018-11-28 NOTE — Assessment & Plan Note (Signed)
Advised to quit and she cannot attempt now with pandemic and recent loss of spouse.

## 2018-12-02 ENCOUNTER — Telehealth: Payer: Self-pay

## 2018-12-02 NOTE — Telephone Encounter (Signed)
Does she think she has a urine infection or what is the reason?

## 2018-12-02 NOTE — Telephone Encounter (Signed)
Copied from [place] [id number]. Topic: General - Other >> Dec 02, 2018  2:26 PM Erica [name] wrote: Reason for CRM: Patient called to ask if [name] urine specimen can be added to her labs please.

## 2018-12-02 NOTE — Telephone Encounter (Signed)
Patient states no she just wanted all her labs that she would get for a physical placed informed her that they are placed. Patient stated understanding.

## 2018-12-04 ENCOUNTER — Encounter: Payer: Self-pay | Admitting: Internal Medicine

## 2018-12-04 ENCOUNTER — Ambulatory Visit (INDEPENDENT_AMBULATORY_CARE_PROVIDER_SITE_OTHER): Payer: Medicare Other | Admitting: Internal Medicine

## 2018-12-04 DIAGNOSIS — F99 Mental disorder, not otherwise specified: Secondary | ICD-10-CM | POA: Diagnosis not present

## 2018-12-04 DIAGNOSIS — M542 Cervicalgia: Secondary | ICD-10-CM | POA: Diagnosis not present

## 2018-12-04 MED ORDER — PREDNISONE 20 MG PO TABS: 40.0000 mg | ORAL_TABLET | Freq: Every day | ORAL | 0 refills | Status: DC

## 2018-12-04 NOTE — Assessment & Plan Note (Addendum)
Due to bee sting rx for prednisone given location. Keep using benadryl for itching and use ice for pain and swelling. If any SOB or face or mouth/tongue swelling go to ER immediately.

## 2018-12-04 NOTE — Progress Notes (Signed)
Virtual Visit via Video Note  I connected with Erica [name] on 12/04/18 at 10:40 AM EDT by a video enabled telemedicine application and verified that I am speaking with the correct person using two identifiers.  The patient and the provider were at separate locations throughout the entire encounter.   I discussed the limitations of evaluation and management by telemedicine and the availability of in person appointments. The patient expressed understanding and agreed to proceed.  History of Present Illness: The patient is a 68 y.o. female with visit for bee sting on her neck. She is having pain and swelling in that area with some rash. Started yesterday around 7 am. Did take benadryl a while after that. She has not needed any breathing treatments since that. She is now having pain and redness. Has some swelling in the area of the bite. Denies swelling in the face or around the mouth. No problems with SOB or breathing difficulty. Overall it is stable since onset. Has tried benadryl but she is very careful not to take too close to her hydrocodone BID.  Observations/Objective: Appearance: normal, swelling on the right lateral neck video quality not good enough to see a sting area, no swelling in the frontal neck or face, oropharynx without swelling or redness, breathing appears normal and no coughing or dyspnea, casual grooming, abdomen does not appear distended, throat normal, memory normal, mental status is A and O times 3  Assessment and Plan: See problem oriented charting  Follow Up Instructions: rx prednisone, can use benadryl spaced   I discussed the assessment and treatment plan with the patient. The patient was provided an opportunity to ask questions and all were answered. The patient agreed with the plan and demonstrated an understanding of the instructions.   The patient was advised to call back or seek an in-person evaluation if the symptoms worsen or if the condition fails to improve  as anticipated.  [name], MD

## 2018-12-05 ENCOUNTER — Other Ambulatory Visit (INDEPENDENT_AMBULATORY_CARE_PROVIDER_SITE_OTHER): Payer: Medicare Other

## 2018-12-05 ENCOUNTER — Other Ambulatory Visit: Payer: Self-pay

## 2018-12-05 ENCOUNTER — Ambulatory Visit (INDEPENDENT_AMBULATORY_CARE_PROVIDER_SITE_OTHER): Payer: Medicare Other

## 2018-12-05 DIAGNOSIS — J439 Emphysema, unspecified: Secondary | ICD-10-CM

## 2018-12-05 DIAGNOSIS — E559 Vitamin D deficiency, unspecified: Secondary | ICD-10-CM

## 2018-12-05 DIAGNOSIS — E782 Mixed hyperlipidemia: Secondary | ICD-10-CM

## 2018-12-05 DIAGNOSIS — R7989 Other specified abnormal findings of blood chemistry: Secondary | ICD-10-CM | POA: Diagnosis not present

## 2018-12-05 DIAGNOSIS — K50012 Crohn's disease of small intestine with intestinal obstruction: Secondary | ICD-10-CM | POA: Diagnosis not present

## 2018-12-05 DIAGNOSIS — M159 Polyosteoarthritis, unspecified: Secondary | ICD-10-CM

## 2018-12-05 DIAGNOSIS — F1120 Opioid dependence, uncomplicated: Secondary | ICD-10-CM

## 2018-12-05 DIAGNOSIS — M150 Primary generalized (osteo)arthritis: Secondary | ICD-10-CM

## 2018-12-05 DIAGNOSIS — Z23 Encounter for immunization: Secondary | ICD-10-CM | POA: Diagnosis not present

## 2018-12-05 DIAGNOSIS — M8949 Other hypertrophic osteoarthropathy, multiple sites: Secondary | ICD-10-CM

## 2018-12-05 LAB — COMPREHENSIVE METABOLIC PANEL
ALT: 12 U/L (ref 0–35)
AST: 14 U/L (ref 0–37)
Albumin: 4.2 g/dL (ref 3.5–5.2)
Alkaline Phosphatase: 63 U/L (ref 39–117)
BUN: 20 mg/dL (ref 6–23)
CO2: 26 mEq/L (ref 19–32)
Calcium: 9.5 mg/dL (ref 8.4–10.5)
Chloride: 106 mEq/L (ref 96–112)
Creatinine, Ser: 0.75 mg/dL (ref 0.40–1.20)
GFR: 76.68 mL/min (ref >60.00)
Glucose, Bld: 123 mg/dL — ABNORMAL HIGH (ref 70–99)
Potassium: 3.9 mEq/L (ref 3.5–5.1)
Sodium: 140 mEq/L (ref 135–145)
Total Bilirubin: 0.4 mg/dL (ref 0.2–1.2)
Total Protein: 6.7 g/dL (ref 6.0–8.3)

## 2018-12-05 LAB — LIPID PANEL
Cholesterol: 190 mg/dL (ref 0–200)
HDL: 60.9 mg/dL (ref >39.00)
LDL Cholesterol: 93 mg/dL (ref 0–99)
NonHDL: 129.46
Total CHOL/HDL Ratio: 3
Triglycerides: 181 mg/dL — ABNORMAL HIGH (ref 0.0–149.0)
VLDL: 36.2 mg/dL (ref 0.0–40.0)

## 2018-12-05 LAB — IBC PANEL
Iron: 72 ug/dL (ref 42–145)
Saturation Ratios: 23.9 % (ref 20.0–50.0)
Transferrin: 215 mg/dL (ref 212.0–360.0)

## 2018-12-05 LAB — CBC
HCT: 39.2 % (ref 36.0–46.0)
Hemoglobin: 13 g/dL (ref 12.0–15.0)
MCHC: 33.1 g/dL (ref 30.0–36.0)
MCV: 91.8 fl (ref 78.0–100.0)
Platelets: 267 10*3/uL (ref 150.0–400.0)
RBC: 4.27 Mil/uL (ref 3.87–5.11)
RDW: 13.6 % (ref 11.5–15.5)
WBC: 9.2 10*3/uL (ref 4.0–10.5)

## 2018-12-05 LAB — VITAMIN D 25 HYDROXY (VIT D DEFICIENCY, FRACTURES): VITD: 46.88 ng/mL (ref 30.00–100.00)

## 2018-12-06 ENCOUNTER — Telehealth: Payer: Self-pay

## 2018-12-06 LAB — PTH, INTACT AND CALCIUM
Calcium: 9.8 mg/dL (ref 8.6–10.4)
PTH: 27 pg/mL (ref 14–64)

## 2018-12-06 LAB — SARS-COV-2 ANTIBODY, IGM: SARS-CoV-2 Antibody, IgM: NEGATIVE

## 2018-12-06 LAB — CALCIUM, IONIZED: Calcium, Ion: 5.48 mg/dL (ref 4.8–5.6)

## 2018-12-06 NOTE — Telephone Encounter (Signed)
Called patient about lab results. She had some questions I could not answer.

## 2018-12-08 LAB — PAIN MGMT, PROFILE 8 W/CONF, U
6 Acetylmorphine: NEGATIVE ng/mL
Alcohol Metabolites: NEGATIVE ng/mL (ref <500)
Alphahydroxyalprazolam: NEGATIVE ng/mL
Alphahydroxymidazolam: NEGATIVE ng/mL
Alphahydroxytriazolam: NEGATIVE ng/mL
Aminoclonazepam: NEGATIVE ng/mL
Amphetamines: NEGATIVE ng/mL
Benzodiazepines: POSITIVE ng/mL
Buprenorphine, Urine: NEGATIVE ng/mL
Cocaine Metabolite: NEGATIVE ng/mL
Codeine: NEGATIVE ng/mL
Creatinine: 100.8 mg/dL
Hydrocodone: 768 ng/mL
Hydromorphone: 270 ng/mL
Hydroxyethylflurazepam: NEGATIVE ng/mL
Lorazepam: NEGATIVE ng/mL
MDMA: NEGATIVE ng/mL
Marijuana Metabolite: NEGATIVE ng/mL
Morphine: NEGATIVE ng/mL
Nordiazepam: 146 ng/mL
Norhydrocodone: 1911 ng/mL
Opiates: POSITIVE ng/mL
Oxazepam: 182 ng/mL
Oxidant: NEGATIVE ug/mL
Oxycodone: NEGATIVE ng/mL
Temazepam: 245 ng/mL
pH: 5.1 (ref 4.5–9.0)

## 2018-12-10 ENCOUNTER — Telehealth: Payer: Self-pay | Admitting: *Deleted

## 2018-12-10 NOTE — Telephone Encounter (Signed)
Reviewed lab results with patient. Answered all questions at this time. Discussed dietary changes to help improve results.

## 2018-12-11 DIAGNOSIS — F99 Mental disorder, not otherwise specified: Secondary | ICD-10-CM | POA: Diagnosis not present

## 2018-12-18 DIAGNOSIS — F99 Mental disorder, not otherwise specified: Secondary | ICD-10-CM | POA: Diagnosis not present

## 2018-12-20 ENCOUNTER — Telehealth: Payer: Self-pay | Admitting: Internal Medicine

## 2018-12-20 MED ORDER — HYDROCODONE-ACETAMINOPHEN 5-325 MG PO TABS: 1.0000 | ORAL_TABLET | Freq: Two times a day (BID) | ORAL | 0 refills | Status: DC | PRN

## 2018-12-20 NOTE — Telephone Encounter (Signed)
Sent in

## 2018-12-20 NOTE — Telephone Encounter (Signed)
Control database checked last refill: 11/21/2018 60tabs LOV: 11/27/2018 pain management  ITG:PQDI

## 2018-12-20 NOTE — Telephone Encounter (Signed)
Medication Refill - Medication:  HYDROcodone-acetaminophen (NORCO/VICODIN) 5-325 MG tablet   Has the patient contacted their pharmacy?  Yes advised to call office.   Preferred Pharmacy (with phone number or street name):  [facility], [place] [phone] (Phone) [phone] (Fax)   Agent: Please be advised that RX refills may take up to 3 business days. We ask that you follow-up with your pharmacy.

## 2018-12-25 DIAGNOSIS — F99 Mental disorder, not otherwise specified: Secondary | ICD-10-CM | POA: Diagnosis not present

## 2018-12-31 ENCOUNTER — Other Ambulatory Visit: Payer: Self-pay | Admitting: Internal Medicine

## 2019-01-01 DIAGNOSIS — F99 Mental disorder, not otherwise specified: Secondary | ICD-10-CM | POA: Diagnosis not present

## 2019-01-08 DIAGNOSIS — F99 Mental disorder, not otherwise specified: Secondary | ICD-10-CM | POA: Diagnosis not present

## 2019-01-13 ENCOUNTER — Telehealth: Payer: Self-pay | Admitting: *Deleted

## 2019-01-13 NOTE — Telephone Encounter (Signed)
Call placed to pt to reschedule appt 01/22/2019 with Cecilie Kicks, virtually, and has been rescheduled with Richardson Dopp, PA-C, 01/24/2019, via virtual video. Pt gave verbal consent.     Virtual Visit Pre-Appointment Phone Call  "(Name), I am calling you today to discuss your upcoming appointment. We are currently trying to limit exposure to the virus that causes COVID-19 by seeing patients at home rather than in the office."  1. "What is the BEST phone number to call the day of the visit?" - include this in appointment notes  2. "Do you have or have access to (through a family member/friend) a smartphone with video capability that we can use for your visit?" a. If yes - list this number in appt notes as "cell" (if different from BEST phone #) and list the appointment type as a VIDEO visit in appointment notes b. If no - list the appointment type as a PHONE visit in appointment notes  3. Confirm consent - "In the setting of the current Covid19 crisis, you are scheduled for a (phone or video) visit with your provider on (date) at (time).  Just as we do with many in-office visits, in order for you to participate in this visit, we must obtain consent.  If you'd like, I can send this to your mychart (if signed up) or email for you to review.  Otherwise, I can obtain your verbal consent now.  All virtual visits are billed to your insurance company just like a normal visit would be.  By agreeing to a virtual visit, we'd like you to understand that the technology does not allow for your provider to perform an examination, and thus may limit your provider's ability to fully assess your condition. If your provider identifies any concerns that need to be evaluated in person, we will make arrangements to do so.  Finally, though the technology is pretty good, we cannot assure that it will always work on either your or our end, and in the setting of a video visit, we may have to convert it to a phone-only visit.   In either situation, we cannot ensure that we have a secure connection.  Are you willing to proceed?" STAFF: Did the patient verbally acknowledge consent to telehealth visit? Document YES/NO here: YES  4. Advise patient to be prepared - "Two hours prior to your appointment, go ahead and check your blood pressure, pulse, oxygen saturation, and your weight (if you have the equipment to check those) and write them all down. When your visit starts, your provider will ask you for this information. If you have an Apple Watch or Kardia device, please plan to have heart rate information ready on the day of your appointment. Please have a pen and paper handy nearby the day of the visit as well."  5. Give patient instructions for MyChart download to smartphone OR Doximity/Doxy.me as below if video visit (depending on what platform provider is using)  6. Inform patient they will receive a phone call 15 minutes prior to their appointment time (may be from unknown caller ID) so they should be prepared to answer    TELEPHONE CALL NOTE  Alannis Height has been deemed a candidate for a follow-up tele-health visit to limit community exposure during the Covid-19 pandemic. I spoke with the patient via phone to ensure availability of phone/video source, confirm preferred email & phone number, and discuss instructions and expectations.  I reminded [name] to be prepared with any vital sign and/or heart  rhythm information that could potentially be obtained via home monitoring, at the time of her visit. I reminded Lindell Tussey to expect a phone call prior to her visit.  Jeanann Lewandowsky, [place] 01/13/2019 10:01 AM   INSTRUCTIONS FOR DOWNLOADING THE MYCHART APP TO SMARTPHONE  - The patient must first make sure to have activated MyChart and know their login information - If Apple, go to [employer] and type in MyChart in the search bar and download the app. If Android, ask patient to go to [employer]  and type in [place] in the search bar and download the app. The app is free but as with any other app downloads, their phone may require them to verify saved payment information or Apple/Android password.  - The patient will need to then log into the app with their MyChart username and password, and select [facility] as their healthcare provider to link the account. When it is time for your visit, go to the MyChart app, find appointments, and click Begin Video Visit. Be sure to Select Allow for your device to access the Microphone and Camera for your visit. You will then be connected, and your provider will be with you shortly.  **If they have any issues connecting, or need assistance please contact MyChart service desk [phone])**  **If using a computer, in order to ensure the best quality for their visit they will need to use either of the following Internet Browsers: [employer], or Google Chrome**  IF USING DOXIMITY or DOXY.ME - The patient will receive a link just prior to their visit by text.     FULL LENGTH CONSENT FOR TELE-HEALTH VISIT   I hereby voluntarily request, consent and authorize [facility] and its employed or contracted physicians, physician assistants, nurse practitioners or other licensed health care professionals (the Practitioner), to provide me with telemedicine health care services (the "Services") as deemed necessary by the treating Practitioner. I acknowledge and consent to receive the Services by the Practitioner via telemedicine. I understand that the telemedicine visit will involve communicating with the Practitioner through live audiovisual communication technology and the disclosure of certain medical information by electronic transmission. I acknowledge that I have been given the opportunity to request an in-person assessment or other available alternative prior to the telemedicine visit and am voluntarily participating in the telemedicine  visit.  I understand that I have the right to withhold or withdraw my consent to the use of telemedicine in the course of my care at any time, without affecting my right to future care or treatment, and that the Practitioner or I may terminate the telemedicine visit at any time. I understand that I have the right to inspect all information obtained and/or recorded in the course of the telemedicine visit and may receive copies of available information for a reasonable fee.  I understand that some of the potential risks of receiving the Services via telemedicine include:  [name] Delay or interruption in medical evaluation due to technological equipment failure or disruption; . Information transmitted may not be sufficient (e.g. poor resolution of images) to allow for appropriate medical decision making by the Practitioner; and/or  . In rare instances, security protocols could fail, causing a breach of personal health information.  Furthermore, I acknowledge that it is my responsibility to provide information about my medical history, conditions and care that is complete and accurate to the best of my ability. I acknowledge that Practitioner's advice, recommendations, and/or decision may be based on factors  not within their control, such as incomplete or inaccurate data provided by me or distortions of diagnostic images or specimens that may result from electronic transmissions. I understand that the practice of medicine is not an exact science and that Practitioner makes no warranties or guarantees regarding treatment outcomes. I acknowledge that I will receive a copy of this consent concurrently upon execution via email to the email address I last provided but may also request a printed copy by calling the office of [facility].    I understand that my insurance will be billed for this visit.   I have read or had this consent read to me. . I understand the contents of this consent, which adequately explains  the benefits and risks of the Services being provided via telemedicine.  . I have been provided ample opportunity to ask questions regarding this consent and the Services and have had my questions answered to my satisfaction. . I give my informed consent for the services to be provided through the use of telemedicine in my medical care  By participating in this telemedicine visit I agree to the above.

## 2019-01-15 DIAGNOSIS — F99 Mental disorder, not otherwise specified: Secondary | ICD-10-CM | POA: Diagnosis not present

## 2019-01-17 ENCOUNTER — Other Ambulatory Visit: Payer: Self-pay | Admitting: Internal Medicine

## 2019-01-17 MED ORDER — HYDROCODONE-ACETAMINOPHEN 5-325 MG PO TABS: 1.0000 | ORAL_TABLET | Freq: Two times a day (BID) | ORAL | 0 refills | Status: DC | PRN

## 2019-01-17 NOTE — Telephone Encounter (Signed)
Control database checked last refill: 12/20/2018 60 tabs LOV: 11/27/2018 pain management, acute 12/04/2018 QZE:SPQZ

## 2019-01-17 NOTE — Telephone Encounter (Signed)
Medication Refill - Medication: HYDROcodone-acetaminophen (NORCO/VICODIN) 5-325 MG tablet , Vitamin D, Ergocalciferol, (DRISDOL) 1.25 MG (50000 UT) CAPS capsule    Has the patient contacted their pharmacy? No. (Agent: If no, request that the patient contact the pharmacy for the refill.) (Agent: If yes, when and what did the pharmacy advise?)  Preferred Pharmacy (with phone number or street name):  [facility], [place] [address]  Phone: [phone] Fax: [phone]  Not a 24 hour pharmacy; exact hours not known.     Agent: Please be advised that RX refills may take up to 3 business days. We ask that you follow-up with your pharmacy.

## 2019-01-17 NOTE — Telephone Encounter (Signed)
Vitamin D levels are improved on recent labs so she should not continue with high dose vitamin D and can switch to 2000 units over the counter vitamin D.

## 2019-01-17 NOTE — Telephone Encounter (Signed)
Requested medication (s) are due for refill today: yes  Requested medication (s) are on the active medication list:yes  Last refill:  12/20/2018  Future visit scheduled: no  Notes to clinic:  Refill cannot be delegated    Requested Prescriptions  Pending Prescriptions Disp Refills   HYDROcodone-acetaminophen (NORCO/VICODIN) 5-325 MG tablet 60 tablet 0    Sig: Take 1 tablet by mouth 2 (two) times daily as needed for moderate pain (pain).     Not Delegated - Analgesics:  Opioid Agonist Combinations Failed - 01/17/2019  8:12 AM      Failed - This refill cannot be delegated      Passed - Urine Drug Screen completed in last 360 days.      Passed - Valid encounter within last 6 months    Recent Outpatient Visits          1 month ago Neck pain   [place], Elizabeth A, MD   1 month ago Narcotic dependence [facility])   [place], Elizabeth A, MD   4 months ago Vitamin D deficiency   Wrigley Templeton, Olevia Bowens, DO   4 months ago Narcotic dependence [facility])   [place], Elizabeth A, MD   5 months ago Psychophysiological insomnia   [place] Taylor, [place], MD      Future Appointments            In 1 week Kathlen Mody, Blair Dolphin, PA-C [place] [facility], [facility]            Vitamin D, Ergocalciferol, (DRISDOL) 1.25 MG (50000 UT) CAPS capsule 12 capsule 0    Sig: TAKE 1 CAPSULE BY MOUTH EVERY 7 DAYS     Endocrinology:  Vitamins - Vitamin D Supplementation Failed - 01/17/2019  8:12 AM      Failed - 50,000 IU strengths are not delegated      Failed - Vitamin D in normal range and within 360 days    VITD  Date Value Ref Range Status  12/05/2018 46.88 30.00 - 100.00 ng/mL Final         Passed - Ca in normal range and within 360 days    Calcium  Date Value Ref Range Status  12/05/2018 9.5 8.4 - 10.5  mg/dL Final  12/05/2018 9.8 8.6 - 10.4 mg/dL Final         Passed - Phosphate in normal range and within 360 days    Phosphorus  Date Value Ref Range Status  04/25/2018 3.9 2.5 - 4.6 mg/dL Final    Comment:    Performed at [facility], [place] [address] [place],  [address]         Passed - Valid encounter within last 12 months    Recent Outpatient Visits          1 month ago Neck pain   [place], Elizabeth A, MD   1 month ago Narcotic dependence [facility])   [place], MD   4 months ago Vitamin D deficiency   [place], [place], DO   4 months ago Narcotic dependence [facility])   [facility] -Chuck Hint, MD   5 months ago Psychophysiological insomnia   [facility] -Chuck Hint, MD  Future Appointments            In 1 week Liliane Shi, PA-C [facility] [address], [facility]

## 2019-01-17 NOTE — Telephone Encounter (Signed)
LVM infomring patient of MD response

## 2019-01-22 ENCOUNTER — Telehealth: Payer: Medicare Other | Admitting: Cardiology

## 2019-01-23 NOTE — Progress Notes (Signed)
Virtual Visit via Video Note   This visit type was conducted due to national recommendations for restrictions regarding the COVID-19 Pandemic (e.g. social distancing) in an effort to limit this patient's exposure and mitigate transmission in our community.  Due to her co-morbid illnesses, this patient is at least at moderate risk for complications without adequate follow up.  This format is felt to be most appropriate for this patient at this time.  All issues noted in this document were discussed and addressed.  A limited physical exam was performed with this format.  Please refer to the patient's chart for her consent to telehealth for [facility].   Date:  01/24/2019   ID:  Erica [name], DOB 1950/10/07, MRN [id number]  Patient Location: Home Provider Location: Home  PCP:  [name], MD  Cardiologist:  Sinclair Grooms, MD   Electrophysiologist:  None   Evaluation Performed:  Follow-Up Visit  Chief Complaint:  Hx of Tako-Tsubo CM   History of Present Illness:    Erica [name] is a 68 y.o. female with:  Hx of Tako-Tsubo CM in 2010  Tobacco use  Hyperlipidemia  Hypertension   Chronic diastolic CHF  Colon CA  COPD  Crohn's Disease  GERD  CAD  Coronary artery calcification on CT scan 05/2018  Myoview 2017: No ischemia  She was last seen by Dr. [name] in 01/2018.  Today, she notes she has overall been stable.  Unfortunately, her only living sibling passed away in Aug 25, 2022.  Her husband also developed renal failure earlier this year and passed away.  She has been under a great deal of stress.  She has occasional chest discomfort.  She points to her sternum and it seems to only occur with certain types of movements.  She has had this chest discomfort for years without any significant change.  She has not had any new symptoms.  She has dyspnea with exertion.  This is fairly chronic and has gradually worsened over time secondary to COPD.  She has not had  syncope or leg swelling.  She sometimes notes that her blood pressures run low.    Past Medical History:  Diagnosis Date   Acute duodenitis 04/24/2017   ANEMIA    BACK PAIN, LUMBAR    Cancer of ascending colon [id number] s/p colectomy 03/05/2009 02/18/2010   Qualifier: Diagnosis of  By: Carlean Purl MD, Tonna Boehringer E    CHF (congestive heart failure) ([place])    Complete rotator cuff tear of left shoulder 11/27/2013   Ultrasound guided injection on November 27, 2013    COPD (chronic obstructive pulmonary disease) with emphysema ([place])    COPD exacerbation ([place]) 11/14/2017   Crohn's  02/2010   ileal ulcers, intol of entercort--refuses treatment   GERD    History of transfusion of whole blood    HYPERLIPIDEMIA    HYPERTENSION    Microscopic hematuria    chronic   Obstruction of intestine or colon ([place])    adhesions   OSTEOARTHRITIS    Rectal fissure    Possible fissure   Rotator cuff tear    Takotsubo syndrome 12/2008   URINARY INCONTINENCE    Past Surgical History:  Procedure Laterality Date   ABDOMINAL HYSTERECTOMY  1994   APPENDECTOMY  1964   BLADDER SUSPENSION     CESAREAN SECTION     x 3   CHOLECYSTECTOMY  1995   COLONOSCOPY W/ BIOPSIES AND POLYPECTOMY  01/24/2011   (Crohn's)ileitis, internal hemorrhoids   ECTOPIC PREGNANCY  SURGERY     ESOPHAGOGASTRODUODENOSCOPY     FACIAL COSMETIC SURGERY     HAND SURGERY Bilateral 1991   KNEE SURGERY Right 1981   LYSIS OF ADHESION  2010   ex lap/LOA for SBO Dr Excell Seltzer   ORBITAL FRACTURE SURGERY     RIGHT COLECTOMY  03/2009   Right colectomy for colon CA.  Dr Donne Hazel   TONSILLECTOMY  1952   TOTAL KNEE ARTHROPLASTY  03/2010   Dr Alvan Dame.  Depuy   UPPER GASTROINTESTINAL ENDOSCOPY  05/16/2010   normal   UTERINE SUSPENSION     VIDEO BRONCHOSCOPY Bilateral 12/18/2014   Procedure: VIDEO BRONCHOSCOPY WITHOUT FLUORO;  Surgeon: Tanda Rockers, MD;  Location: [facility];  Service: Cardiopulmonary;  Laterality:  Bilateral;     Current Meds  Medication Sig   albuterol (PROVENTIL) (2.5 MG/3ML) 0.083% nebulizer solution Take 3 mLs (2.5 mg total) by nebulization every 6 (six) hours as needed for wheezing or shortness of breath.   albuterol (VENTOLIN HFA) 108 (90 Base) MCG/ACT inhaler INHALE 2 PUFFS BY MOUTH EVERY 4 HOURS AS NEEDED FOR WHEEZING OR SHORTNESS OF BREATH   aspirin EC 81 MG tablet Take 81 mg by mouth daily.   cyclobenzaprine (FLEXERIL) 10 MG tablet Take 1 tablet by mouth three times daily as needed for muscle spasm   fluticasone (FLONASE) 50 MCG/ACT nasal spray Place 2 sprays into both nostrils daily.   HYDROcodone-acetaminophen (NORCO/VICODIN) 5-325 MG tablet Take 1 tablet by mouth 2 (two) times daily as needed for moderate pain (pain).   metoprolol tartrate (LOPRESSOR) 25 MG tablet Take 1 tablet (25 mg total) by mouth 2 (two) times daily.   pantoprazole (PROTONIX) 40 MG tablet Take 1 tablet (40 mg total) by mouth daily at 6 (six) AM.   predniSONE (DELTASONE) 20 MG tablet Take 2 tablets (40 mg total) by mouth daily with breakfast.   senna (SENOKOT) 8.6 MG TABS tablet Take 1 tablet (8.6 mg total) by mouth daily as needed for mild constipation.     Allergies:   Morphine   Social History   Tobacco Use   Smoking status: Current Every Day Smoker    Packs/day: 1.00    Years: 50.00    Pack years: 50.00    Types: Cigarettes   Smokeless tobacco: Never Used  Substance Use Topics   Alcohol use: No    Alcohol/week: 0.0 standard drinks   Drug use: No     Family Hx: The patient's family history includes Arthritis in an other family member; Colon cancer (age of onset: 50) in her sister; Colon cancer (age of onset: 15) in her father; Heart disease in her father; Hypertension in her father; Kidney disease in her father; Liver cancer in her sister; Other in her sister; Throat cancer in her mother.  ROS:   Please see the history of present illness.    All other systems reviewed and  are negative.   Prior CV studies:   The following studies were reviewed today:  Chest CT 05/22/2018 IMPRESSION: 1. Lung-RADS 2S, benign appearance or behavior. Continue annual screening with low-dose chest CT without contrast in 12 months. 2. The "S" modifier above refers to potentially clinically significant non lung cancer related findings. Specifically, there is aortic atherosclerosis, in addition to left main and 3 vessel coronary artery disease. Please note that although the presence of coronary artery calcium documents the presence of coronary artery disease, the severity of this disease and any potential stenosis cannot be assessed on this non-gated  CT examination. Assessment for potential risk factor modification, dietary therapy or pharmacologic therapy may be warranted, if clinically indicated. 3. Mild diffuse bronchial wall thickening with mild centrilobular and paraseptal emphysema; imaging findings suggestive of underlying COPD. 4. 2.9 cm left adrenal adenoma.  Aortic Atherosclerosis (ICD10-I70.0) and Emphysema (ICD10-J43.9).  Myoview 08/16/15  There was no ST segment deviation noted during stress.  No T wave inversion was noted during stress.  The study is normal.  This is a low risk study.  The left ventricular ejection fraction is normal (55-65%).  Echocardiogram 10/17/2013 EF 60-65, Gr 1 DD  Cardiac catheterization 02/12/2009 Normal coronary arteries EF, ant and apical HK  Labs/Other Tests and Data Reviewed:    EKG:  No ECG reviewed.  Recent Labs: 04/25/2018: TSH 0.443 04/28/2018: Magnesium 2.0 12/05/2018: ALT 12; BUN 20; Creatinine, Ser 0.75; Hemoglobin 13.0; Platelets 267.0; Potassium 3.9; Sodium 140   Recent Lipid Panel Lab Results  Component Value Date/Time   CHOL 190 12/05/2018 08:05 AM   TRIG 181.0 (H) 12/05/2018 08:05 AM   HDL 60.90 12/05/2018 08:05 AM   CHOLHDL 3 12/05/2018 08:05 AM   LDLCALC 93 12/05/2018 08:05 AM   LDLDIRECT 138.5  09/30/2012 03:18 PM     Wt Readings from Last 3 Encounters:  01/24/19 122 lb (55.3 kg)  05/28/18 125 lb (56.7 kg)  05/10/18 122 lb (55.3 kg)     Objective:    Vital Signs:  BP 127/80    Pulse 74    Ht 5' 2"  (1.575 m)    Wt 122 lb (55.3 kg)    BMI 22.31 kg/m    VITAL SIGNS:  reviewed GEN:  no acute distress RESPIRATORY:  Normal respiratory effort NEURO:  alert and oriented x 3, no obvious focal deficit PSYCH:  normal affect  ASSESSMENT & PLAN:    1. Coronary artery calcification seen on CAT scan She had a recent CT scan done for lung cancer screening.  This did not demonstrate three-vessel coronary artery disease.  She had a cardiac catheterization 2010 at the time of her stress-induced myocardial infarction.  She has chronic chest discomfort.  She has had these for years.  She specifically denies any change or new symptoms.  We discussed the rationale for whether or not to proceed with stress testing.  As she has no new or changing symptoms, at this point, I do not recommend proceeding with stress testing.  However, we did discuss the importance of aggressive risk factor modification.  Her 10-year ASCVD risk is 16%.  I recommend starting moderate to high intensity statin therapy as well as to start on aspirin therapy.  -Start Lipitor 40 mg daily  -Start ASA 81 mg daily  -Obtain fasting lipids, LFTs in 3 months   -Follow-up in 6 months  -She knows to call for sooner follow-up if she has any changing or new chest symptoms  The 10-year ASCVD risk score Mikey Bussing DC Jr., et al., 2013) is: 16%   Values used to calculate the score:     Age: 54 years     Sex: Female     Is Non-Hispanic African American: No     Diabetic: No     Tobacco smoker: Yes     Systolic Blood Pressure: 841 mmHg     Is BP treated: Yes     HDL Cholesterol: 60.9 mg/dL     Total Cholesterol: 190 mg/dL   2. Takotsubo syndrome History of Takotsubo syndrome in 2010.  At that time, she had  normal coronary arteries.   Echocardiogram in 2015 demonstrated normal LV function.  3. Chronic diastolic HF (heart failure) (HCC) Volume status stable.  She does not take diuretic therapy.  4. Essential hypertension The patient's blood pressure is controlled on her current regimen.  Continue current therapy.   5. COPD  GOLD II  6. Tobacco abuse I have recommended tobacco cessation.  7. Mixed hyperlipidemia As noted, moderate to high intensity statin therapy will be initiated.   Time:   Today, I have spent 25 minutes with the patient with telehealth technology discussing the above problems.     Medication Adjustments/Labs and Tests Ordered: Current medicines are reviewed at length with the patient today.  Concerns regarding medicines are outlined above.   Tests Ordered: Orders Placed This Encounter  Procedures   Lipid panel   Hepatic function panel    Medication Changes: Meds ordered this encounter  Medications   atorvastatin (LIPITOR) 40 MG tablet    Sig: Take 1 tablet (40 mg total) by mouth daily.    Dispense:  90 tablet    Refill:  3    Follow Up:  In Person in 6 month(s)  Signed, Richardson Dopp, PA-C  01/24/2019 1:41 PM    [facility]

## 2019-01-24 ENCOUNTER — Other Ambulatory Visit: Payer: Self-pay

## 2019-01-24 ENCOUNTER — Telehealth (INDEPENDENT_AMBULATORY_CARE_PROVIDER_SITE_OTHER): Payer: Medicare Other | Admitting: Physician Assistant

## 2019-01-24 VITALS — BP 127/80 | HR 74 | Ht 62.0 in | Wt 122.0 lb

## 2019-01-24 DIAGNOSIS — I5032 Chronic diastolic (congestive) heart failure: Secondary | ICD-10-CM

## 2019-01-24 DIAGNOSIS — I5181 Takotsubo syndrome: Secondary | ICD-10-CM

## 2019-01-24 DIAGNOSIS — I2510 Atherosclerotic heart disease of native coronary artery without angina pectoris: Secondary | ICD-10-CM | POA: Diagnosis not present

## 2019-01-24 DIAGNOSIS — J449 Chronic obstructive pulmonary disease, unspecified: Secondary | ICD-10-CM

## 2019-01-24 DIAGNOSIS — Z720 Tobacco use: Secondary | ICD-10-CM

## 2019-01-24 DIAGNOSIS — I10 Essential (primary) hypertension: Secondary | ICD-10-CM

## 2019-01-24 DIAGNOSIS — E782 Mixed hyperlipidemia: Secondary | ICD-10-CM

## 2019-01-24 MED ORDER — ALBUTEROL SULFATE (2.5 MG/3ML) 0.083% IN NEBU: 2.5000 mg | INHALATION_SOLUTION | Freq: Four times a day (QID) | RESPIRATORY_TRACT | 1 refills | Status: DC | PRN

## 2019-01-24 MED ORDER — ATORVASTATIN CALCIUM 40 MG PO TABS: 40.0000 mg | ORAL_TABLET | Freq: Every day | ORAL | 3 refills | Status: DC

## 2019-01-24 NOTE — Patient Instructions (Signed)
Medication Instructions:  1.) START: Aspirin 81 mg once a day   2.) Atorvastatin 40 mg once a day   *If you need a refill on your cardiac medications before your next appointment, please call your pharmacy*  Lab Work: Your physician recommends that you return for a FASTING lipid profile and hepatic function test on 04/21/2019  If you have labs (blood work) drawn today and your tests are completely normal, you will receive your results only by: [name] MyChart Message (if you have MyChart) OR . A paper copy in the mail If you have any lab test that is abnormal or we need to change your treatment, we will call you to review the results.  Testing/Procedures: None   Follow-Up: At [facility], you and your health needs are our priority.  As part of our continuing mission to provide you with exceptional heart care, we have created designated Provider Care Teams.  These Care Teams include your primary Cardiologist (physician) and Advanced Practice Providers (APPs -  Physician Assistants and Nurse Pra2ctitioners) who all work together to provide you with the care you need, when you need it.  Your next appointment:   6 months  The format for your next appointment:   In Person  Provider:   You may see Sinclair Grooms, MD or one of the following Advanced Practice Providers on your designated Care Team:    Truitt Merle, NP  Cecilie Kicks, NP  Kathyrn Drown, NP   Other Instructions  Continue to work on quitting smoking

## 2019-01-28 ENCOUNTER — Other Ambulatory Visit: Payer: Self-pay | Admitting: Interventional Cardiology

## 2019-01-29 DIAGNOSIS — F99 Mental disorder, not otherwise specified: Secondary | ICD-10-CM | POA: Diagnosis not present

## 2019-02-05 DIAGNOSIS — F99 Mental disorder, not otherwise specified: Secondary | ICD-10-CM | POA: Diagnosis not present

## 2019-02-12 DIAGNOSIS — F99 Mental disorder, not otherwise specified: Secondary | ICD-10-CM | POA: Diagnosis not present

## 2019-02-17 ENCOUNTER — Other Ambulatory Visit: Payer: Self-pay | Admitting: Internal Medicine

## 2019-02-17 MED ORDER — HYDROCODONE-ACETAMINOPHEN 5-325 MG PO TABS: 1.0000 | ORAL_TABLET | Freq: Two times a day (BID) | ORAL | 0 refills | Status: DC | PRN

## 2019-02-17 NOTE — Telephone Encounter (Signed)
Requested medication (s) are due for refill today: yes  Requested medication (s) are on the active medication list: yes  Last refill:  01/19/2019  Future visit scheduled: no  Notes to clinic:  Refill cannot be delegated    Requested Prescriptions  Pending Prescriptions Disp Refills   HYDROcodone-acetaminophen (NORCO/VICODIN) 5-325 MG tablet 60 tablet 0    Sig: Take 1 tablet by mouth 2 (two) times daily as needed for moderate pain (pain).     Not Delegated - Analgesics:  Opioid Agonist Combinations Failed - 02/17/2019  8:37 AM      Failed - This refill cannot be delegated      Passed - Urine Drug Screen completed in last 360 days.      Passed - Valid encounter within last 6 months    Recent Outpatient Visits          2 months ago Neck pain   [place], MD   2 months ago Narcotic dependence [facility])   [place], MD   5 months ago Vitamin D deficiency   [place], [place], DO   5 months ago Narcotic dependence [facility])   [facility] -Chuck Hint, MD   7 months ago Psychophysiological insomnia   [facility] -Chuck Hint, MD

## 2019-02-17 NOTE — Telephone Encounter (Signed)
Routing to CMA 

## 2019-02-17 NOTE — Telephone Encounter (Signed)
Please advise in PCP's absence. Thanks! [place] Controlled Database Checked Last filled: 01/19/19 # 60 LOV: 12/04/18  Next appt: None

## 2019-02-17 NOTE — Telephone Encounter (Signed)
Medication Refill - Medication: HYDROcodone-acetaminophen (NORCO/VICODIN) 5-325 MG tablet   Has the patient contacted their pharmacy? Yes.   (Agent: If no, request that the patient contact the pharmacy for the refill.) (Agent: If yes, when and what did the pharmacy advise?)  Preferred Pharmacy (with phone number or street name):  [place], [address] [place] [address]  Phone: [phone] Fax: [phone]  Not a 24 hour pharmacy; exact hours not known.     Agent: Please be advised that RX refills may take up to 3 business days. We ask that you follow-up with your pharmacy.

## 2019-02-19 DIAGNOSIS — F99 Mental disorder, not otherwise specified: Secondary | ICD-10-CM | POA: Diagnosis not present

## 2019-03-05 DIAGNOSIS — F99 Mental disorder, not otherwise specified: Secondary | ICD-10-CM | POA: Diagnosis not present

## 2019-03-12 DIAGNOSIS — F99 Mental disorder, not otherwise specified: Secondary | ICD-10-CM | POA: Diagnosis not present

## 2019-03-18 ENCOUNTER — Other Ambulatory Visit: Payer: Self-pay | Admitting: Family

## 2019-03-18 ENCOUNTER — Other Ambulatory Visit: Payer: Self-pay | Admitting: Internal Medicine

## 2019-03-18 NOTE — Telephone Encounter (Signed)
Left message for patient to call back to schedule.  °

## 2019-03-18 NOTE — Telephone Encounter (Signed)
Patient scheduled for tomorrow morning

## 2019-03-18 NOTE — Telephone Encounter (Signed)
Control database checked last refill: 02/17/2019 60 tabs LOV:12/04/2018 BCA:FQEH

## 2019-03-18 NOTE — Telephone Encounter (Signed)
Due for visit, can be virtual

## 2019-03-18 NOTE — Telephone Encounter (Signed)
Medication Refill - Medication: HYDROcodone-acetaminophen (NORCO/VICODIN) 5-325 MG tablet    Preferred Pharmacy (with phone number or street name):  [place], [address] [place] Phone:  [phone]  Fax:  [phone]       Agent: Please be advised that RX refills may take up to 3 business days. We ask that you follow-up with your pharmacy.

## 2019-03-18 NOTE — Telephone Encounter (Signed)
Can you schedule patient for a virtual visit. Thank you

## 2019-03-18 NOTE — Telephone Encounter (Signed)
Requested medication (s) are due for refill today: yes  Requested medication (s) are on the active medication list: yes  Last refill:  02/17/2019  Future visit scheduled: no  Notes to clinic:  refill cannot be delegated    Requested Prescriptions  Pending Prescriptions Disp Refills   HYDROcodone-acetaminophen (NORCO/VICODIN) 5-325 MG tablet 60 tablet 0    Sig: Take 1 tablet by mouth 2 (two) times daily as needed for moderate pain (pain).      Not Delegated - Analgesics:  Opioid Agonist Combinations Failed - 03/18/2019  9:09 AM      Failed - This refill cannot be delegated      Passed - Urine Drug Screen completed in last 360 days.      Passed - Valid encounter within last 6 months    Recent Outpatient Visits           3 months ago Neck pain   [place], MD   3 months ago Narcotic dependence [facility])   [place], MD   6 months ago Vitamin D deficiency   [place], [place], DO   6 months ago Narcotic dependence [facility])   [facility] -Chuck Hint, MD   7 months ago Psychophysiological insomnia   [facility] -Chuck Hint, MD

## 2019-03-19 ENCOUNTER — Encounter: Payer: Self-pay | Admitting: Internal Medicine

## 2019-03-19 ENCOUNTER — Ambulatory Visit (INDEPENDENT_AMBULATORY_CARE_PROVIDER_SITE_OTHER): Payer: Medicare Other | Admitting: Internal Medicine

## 2019-03-19 DIAGNOSIS — F5104 Psychophysiologic insomnia: Secondary | ICD-10-CM

## 2019-03-19 DIAGNOSIS — F99 Mental disorder, not otherwise specified: Secondary | ICD-10-CM | POA: Diagnosis not present

## 2019-03-19 DIAGNOSIS — Z720 Tobacco use: Secondary | ICD-10-CM

## 2019-03-19 DIAGNOSIS — I2510 Atherosclerotic heart disease of native coronary artery without angina pectoris: Secondary | ICD-10-CM | POA: Diagnosis not present

## 2019-03-19 DIAGNOSIS — F1120 Opioid dependence, uncomplicated: Secondary | ICD-10-CM

## 2019-03-19 MED ORDER — HYDROCODONE-ACETAMINOPHEN 5-325 MG PO TABS: 1.0000 | ORAL_TABLET | Freq: Two times a day (BID) | ORAL | 0 refills | Status: DC | PRN

## 2019-03-19 NOTE — Assessment & Plan Note (Signed)
Counseled to quit as 20% risk heart attack/stroke in 10 years. She just started on lipitor. Can reduce risk to 10% with stopping smoking. She is not interested at this time.

## 2019-03-19 NOTE — Assessment & Plan Note (Signed)
Taking hydrocodone BID and using otc as well. She is doing well overall and no side effects. Reminded about risk/benefit and she wishes to continue. [place] narcotic database reviewed and no inappropriate fills. Refill hydrocodone and visit in 3 months.

## 2019-03-19 NOTE — Assessment & Plan Note (Signed)
Not taking valium anymore. Is working with grief counselor to help and overall coping okay. Counseled about strategies for holiday season.

## 2019-03-19 NOTE — Progress Notes (Signed)
Virtual Visit via Video Note  I connected with Erica [name] on 03/19/19 at  8:20 AM EST by a video enabled telemedicine application and verified that I am speaking with the correct person using two identifiers.  The patient and the provider were at separate locations throughout the entire encounter.   I discussed the limitations of evaluation and management by telemedicine and the availability of in person appointments. The patient expressed understanding and agreed to proceed. The patient and the provider were the only parties present for the visit unless noted in HPI below.  History of Present Illness: The patient is a 68 y.o. female with visit for chronic pain management. She is still struggling with grief and is in counseling for this. She is no longer taking valium for this. She is working on her neck pain (taking hydrocodone, exercising some, stays active, this BID allows her to do ADLs and iADLs, she denies injury or overuse, denies worsening symptoms). Overall making some progress. Talked to cardiology recently and given CAD on lung scan she is now taking aspirin daily and lipitor 40 mg daily. She is still smoking and no intention to quit currently.    Observations/Objective: Appearance: normal, breathing appears normal without coughing, casual grooming, abdomen does not appear distended, throat not examined, memory normal, mental status is A and O times 3  Assessment and Plan: See problem oriented charting  Follow Up Instructions: Refill hydrocodone, visit again in 3 months  Visit time 25 minutes: greater than 50% of that time was spent in face to face counseling and coordination of care with the patient: counseled about safety and risks of chronic opioids, as well as counseling about recent grief and strategies to help this time of year as she is encountering a lot of first losses with birthdays/holidays  I discussed the assessment and treatment plan with the patient. The patient was  provided an opportunity to ask questions and all were answered. The patient agreed with the plan and demonstrated an understanding of the instructions.   The patient was advised to call back or seek an in-person evaluation if the symptoms worsen or if the condition fails to improve as anticipated.  [name], MD

## 2019-04-09 DIAGNOSIS — F99 Mental disorder, not otherwise specified: Secondary | ICD-10-CM | POA: Diagnosis not present

## 2019-04-16 DIAGNOSIS — F99 Mental disorder, not otherwise specified: Secondary | ICD-10-CM | POA: Diagnosis not present

## 2019-04-17 ENCOUNTER — Other Ambulatory Visit: Payer: Self-pay | Admitting: Internal Medicine

## 2019-04-17 NOTE — Telephone Encounter (Signed)
Medication Refill - Medication: HYDROcodone-acetaminophen (NORCO/VICODIN) 5-325 MG tablet  Has the patient contacted their pharmacy? no (Agent: If no, request that the patient contact the pharmacy for the refill.) (Agent: If yes, when and what did the pharmacy advise?)  Preferred Pharmacy (with phone number or street name):  [place], [address] [place] Phone:  [phone]  Fax:  [phone]     Agent: Please be advised that RX refills may take up to 3 business days. We ask that you follow-up with your pharmacy.

## 2019-04-17 NOTE — Telephone Encounter (Signed)
Filled 03/19/2019 Hydrocodone-Acetamin 5-325 Mg 60#  Last ov 03/19/19 Next ov 06/16/19

## 2019-04-17 NOTE — Telephone Encounter (Signed)
Requested medication (s) are due for refill today: yes  Requested medication (s) are on the active medication list: yes  Last refill:  03/19/2019  Future visit scheduled:  yes  Notes to clinic:  This refill cannot be delegated    Requested Prescriptions  Pending Prescriptions Disp Refills   HYDROcodone-acetaminophen (NORCO/VICODIN) 5-325 MG tablet 60 tablet 0    Sig: Take 1 tablet by mouth 2 (two) times daily as needed for moderate pain (pain).      Not Delegated - Analgesics:  Opioid Agonist Combinations Failed - 04/17/2019 10:41 AM      Failed - This refill cannot be delegated      Passed - Urine Drug Screen completed in last 360 days.      Passed - Valid encounter within last 6 months    Recent Outpatient Visits           4 weeks ago Narcotic dependence [facility])   [place], MD   4 months ago Neck pain   [place], Elizabeth A, MD   4 months ago Narcotic dependence [facility])   [place], MD   7 months ago Vitamin D deficiency   [place], [place], DO   7 months ago Narcotic dependence [facility])   [facility] -Chuck Hint, MD       Future Appointments             In 2 months Sharlet Salina Real Cons, MD [facility] [place]

## 2019-04-18 DIAGNOSIS — Z20828 Contact with and (suspected) exposure to other viral communicable diseases: Secondary | ICD-10-CM | POA: Diagnosis not present

## 2019-04-18 MED ORDER — HYDROCODONE-ACETAMINOPHEN 5-325 MG PO TABS: 1.0000 | ORAL_TABLET | Freq: Two times a day (BID) | ORAL | 0 refills | Status: DC | PRN

## 2019-04-23 DIAGNOSIS — F99 Mental disorder, not otherwise specified: Secondary | ICD-10-CM | POA: Diagnosis not present

## 2019-04-25 ENCOUNTER — Telehealth: Payer: Self-pay | Admitting: Interventional Cardiology

## 2019-04-25 NOTE — Telephone Encounter (Signed)
New message:     Patient calling concerning some medication changes. Please call patient.

## 2019-04-25 NOTE — Telephone Encounter (Signed)
Pt calling today to inquire about a virtual appt with Dr. [name]. This would be her annual visit with questions regarding her statin. She is not interested in a visit with an APP at this time. I advised her I was not sure Dr. [name] was doing virtual appointments at this time, but would forward to his RN to follow up with her.  She also had some questions regarding the COVID vaccine. I encouraged her to speak with her physician who manages her Chron's dx as this is her main determent in receiving the vaccine.   She had no additional questions at this time.

## 2019-04-28 NOTE — Telephone Encounter (Signed)
Left message to call back  

## 2019-04-29 NOTE — Telephone Encounter (Signed)
Spoke with pt and she was concerned about her Lipitor.  States she seen Richardson Dopp, PA-C in October and was started on ASA and Lipitor.  She had some intermittent issues with lightheadedness, stopped Lipitor for a week and symptoms did not improve.  She has not had her f/u blood work because she had been tested for Covid around the time they were due and didn't want to come in without having the results.  It came back negative.  Scheduled pt for labs this week. Pt appreciative for assistance.

## 2019-04-30 DIAGNOSIS — F99 Mental disorder, not otherwise specified: Secondary | ICD-10-CM | POA: Diagnosis not present

## 2019-05-01 ENCOUNTER — Other Ambulatory Visit: Payer: Medicare Other | Admitting: *Deleted

## 2019-05-01 ENCOUNTER — Telehealth: Payer: Self-pay | Admitting: Internal Medicine

## 2019-05-01 ENCOUNTER — Encounter: Payer: Self-pay | Admitting: *Deleted

## 2019-05-01 ENCOUNTER — Other Ambulatory Visit: Payer: Self-pay | Admitting: *Deleted

## 2019-05-01 ENCOUNTER — Other Ambulatory Visit: Payer: Self-pay

## 2019-05-01 DIAGNOSIS — E782 Mixed hyperlipidemia: Secondary | ICD-10-CM

## 2019-05-01 DIAGNOSIS — Z01818 Encounter for other preprocedural examination: Secondary | ICD-10-CM

## 2019-05-01 LAB — HEPATIC FUNCTION PANEL
ALT: 11 IU/L (ref 0–32)
AST: 13 IU/L (ref 0–40)
Albumin: 4.4 g/dL (ref 3.8–4.8)
Alkaline Phosphatase: 79 IU/L (ref 39–117)
Bilirubin Total: 0.4 mg/dL (ref 0.0–1.2)
Bilirubin, Direct: 0.11 mg/dL (ref 0.00–0.40)
Total Protein: 6.6 g/dL (ref 6.0–8.5)

## 2019-05-01 LAB — LIPID PANEL
Chol/HDL Ratio: 3.1 ratio (ref 0.0–4.4)
Cholesterol, Total: 190 mg/dL (ref 100–199)
HDL: 62 mg/dL (ref >39)
LDL Chol Calc (NIH): 100 mg/dL — ABNORMAL HIGH (ref 0–99)
Triglycerides: 161 mg/dL — ABNORMAL HIGH (ref 0–149)
VLDL Cholesterol Cal: 28 mg/dL (ref 5–40)

## 2019-05-01 NOTE — Telephone Encounter (Signed)
Pt is overdue for colonoscopy--recall 10/2018 but has concerns regarding COVID and the vaccine.  She requested a call back to discuss.

## 2019-05-01 NOTE — Telephone Encounter (Signed)
Spoke to patient and told the patient Dr. Carlean Purl has been encouraging patient's to get the COVID vaccine. Patient said she would think about it. Patient has been scheduled for the following:   06/13/19 at 10:30 am previsit with nurse  06/25/19 at 10:00 am COVID screening  06/27/19 at 10:00 am Colon in [place] with [place] for [place] screen placed in [place].

## 2019-05-02 ENCOUNTER — Encounter: Payer: Self-pay | Admitting: Physician Assistant

## 2019-05-02 ENCOUNTER — Telehealth: Payer: Self-pay

## 2019-05-02 DIAGNOSIS — E782 Mixed hyperlipidemia: Secondary | ICD-10-CM

## 2019-05-02 DIAGNOSIS — I2510 Atherosclerotic heart disease of native coronary artery without angina pectoris: Secondary | ICD-10-CM

## 2019-05-02 MED ORDER — ROSUVASTATIN CALCIUM 10 MG PO TABS: ORAL_TABLET | ORAL | 1 refills | Status: DC

## 2019-05-02 NOTE — Telephone Encounter (Signed)
-----   Message from Liliane Shi, PA-C sent at 05/01/2019  5:17 PM EST ----- LDL not significantly changed.  Triglycerides elevated.  LFTs normal. PLAN:   -Please ask patient if she is taking atorvastatin. Richardson Dopp, PA-C    05/01/2019 5:16 PM

## 2019-05-02 NOTE — Telephone Encounter (Signed)
I called and spoke with patient, she states that she stopped Atorvastatin. She was having some nausea and dizziness after taking the medication, patient does feel better after stopping Atorvastatin.

## 2019-05-02 NOTE — Telephone Encounter (Signed)
Rosuvastatin is often better tolerated.  DC Atorvastatin. Let's try her on Rosuvastatin 10 mg one every Mon, Wed, Fri. Fasting Lipids and LFTs in 3 mos. Richardson Dopp, PA-C    05/02/2019 11:55 AM

## 2019-05-02 NOTE — Telephone Encounter (Signed)
I called and spoke with patient, she is aware to stay off Atorvastatin and start Rosuvastatin 10 mg, three times a week on Monday, Wednesday, and Friday. Prescription sent in. Will recheck labs at 6 month follow up with Dr. [name] on 07/09/19.

## 2019-05-07 DIAGNOSIS — F99 Mental disorder, not otherwise specified: Secondary | ICD-10-CM | POA: Diagnosis not present

## 2019-05-14 DIAGNOSIS — F99 Mental disorder, not otherwise specified: Secondary | ICD-10-CM | POA: Diagnosis not present

## 2019-05-19 ENCOUNTER — Telehealth: Payer: Self-pay | Admitting: Internal Medicine

## 2019-05-19 NOTE — Telephone Encounter (Signed)
    Medication Requested:   Is medication on med list: HYDROcodone-acetaminophen (NORCO/VICODIN) 5-325 MG tablet (if no, inform pt they may need an appointment)   Last visit with PCP: 03/19/19  Pharmacy (Name, Street, [place]):[facility], [address] [place]

## 2019-05-21 DIAGNOSIS — F99 Mental disorder, not otherwise specified: Secondary | ICD-10-CM | POA: Diagnosis not present

## 2019-05-21 MED ORDER — HYDROCODONE-ACETAMINOPHEN 5-325 MG PO TABS: 1.0000 | ORAL_TABLET | Freq: Two times a day (BID) | ORAL | 0 refills | Status: DC | PRN

## 2019-05-22 ENCOUNTER — Other Ambulatory Visit: Payer: Self-pay | Admitting: Physician Assistant

## 2019-05-22 DIAGNOSIS — E041 Nontoxic single thyroid nodule: Secondary | ICD-10-CM

## 2019-05-26 ENCOUNTER — Ambulatory Visit
Admission: RE | Admit: 2019-05-26 | Discharge: 2019-05-26 | Disposition: A | Discharge status: Home / Self Care | Payer: Medicare Other | Source: Ambulatory Visit | Attending: Acute Care | Admitting: Acute Care

## 2019-05-26 ENCOUNTER — Other Ambulatory Visit: Payer: Self-pay

## 2019-05-26 DIAGNOSIS — F17210 Nicotine dependence, cigarettes, uncomplicated: Secondary | ICD-10-CM | POA: Diagnosis not present

## 2019-05-26 DIAGNOSIS — Z87891 Personal history of nicotine dependence: Secondary | ICD-10-CM

## 2019-05-26 DIAGNOSIS — Z122 Encounter for screening for malignant neoplasm of respiratory organs: Secondary | ICD-10-CM

## 2019-05-28 ENCOUNTER — Telehealth: Payer: Self-pay | Admitting: Acute Care

## 2019-05-28 ENCOUNTER — Telehealth: Payer: Self-pay | Admitting: Interventional Cardiology

## 2019-05-28 DIAGNOSIS — Z87891 Personal history of nicotine dependence: Secondary | ICD-10-CM

## 2019-05-28 DIAGNOSIS — F99 Mental disorder, not otherwise specified: Secondary | ICD-10-CM | POA: Diagnosis not present

## 2019-05-28 DIAGNOSIS — F17210 Nicotine dependence, cigarettes, uncomplicated: Secondary | ICD-10-CM

## 2019-05-28 NOTE — Telephone Encounter (Signed)
Patient is requesting Richardson Dopp to take a look at her recent CT Scan. She is wanting someone to look at the cardiovascular results from the scan to help her have a better understanding.

## 2019-05-29 NOTE — Telephone Encounter (Signed)
Pt informed of CT results per Eric Form, NP.  PT verbalized understanding.  Copy sent to PCP and Richardson Dopp per pt request.   Order placed for 1 yr f/u CT.

## 2019-05-29 NOTE — Progress Notes (Signed)
Please call patient and let them  know their  low dose Ct was read as a Lung RADS 2: nodules that are benign in appearance and behavior with a very low likelihood of becoming a clinically active cancer due to size or lack of growth. Recommendation per radiology is for a repeat LDCT in 12 months. .Please let them  know we will order and schedule their  annual screening scan for 05/2020. Please let them  know there was notation of CAD on their  scan.  Please remind the patient  that this is a non-gated exam therefore degree or severity of disease  cannot be determined. Please have them  follow up with their PCP regarding potential risk factor modification, dietary therapy or pharmacologic therapy if clinically indicated. Pt.  is  currently on statin therapy. Please place order for annual  screening scan for  05/2020 and fax results to PCP. Thanks so much.

## 2019-05-30 NOTE — Telephone Encounter (Signed)
The chest CT report from 05/26/2019 outlines aortic atherosclerosis as well as three-vessel coronary artery calcification. The report is similar to the report in February 2020.  Recommend continuing aspirin, rosuvastatin. Keep follow-up with Dr. [name] in April as planned. Richardson Dopp, PA-C    05/30/2019 8:12 AM

## 2019-05-30 NOTE — Telephone Encounter (Signed)
Spoke with pt and made her aware of info from [employer], [place].  Pt appreciative for call.

## 2019-06-04 DIAGNOSIS — F99 Mental disorder, not otherwise specified: Secondary | ICD-10-CM | POA: Diagnosis not present

## 2019-06-11 DIAGNOSIS — F99 Mental disorder, not otherwise specified: Secondary | ICD-10-CM | POA: Diagnosis not present

## 2019-06-13 ENCOUNTER — Ambulatory Visit (AMBULATORY_SURGERY_CENTER): Payer: Self-pay | Admitting: *Deleted

## 2019-06-13 ENCOUNTER — Other Ambulatory Visit: Payer: Self-pay

## 2019-06-13 VITALS — Ht 61.0 in | Wt 125.0 lb

## 2019-06-13 DIAGNOSIS — Z86010 Personal history of colonic polyps: Secondary | ICD-10-CM

## 2019-06-13 DIAGNOSIS — Z1159 Encounter for screening for other viral diseases: Secondary | ICD-10-CM

## 2019-06-13 NOTE — Progress Notes (Signed)
Patient denies any allergies to egg or soy products. Patient denies complications with anesthesia/sedation.  Patient denies oxygen use at home and denies diet medications. Emmi instructions for colonoscopy/endoscopy explained but patient denied information.

## 2019-06-16 ENCOUNTER — Ambulatory Visit: Payer: Medicare Other | Admitting: Internal Medicine

## 2019-06-16 ENCOUNTER — Ambulatory Visit (INDEPENDENT_AMBULATORY_CARE_PROVIDER_SITE_OTHER): Payer: Medicare Other | Admitting: Internal Medicine

## 2019-06-16 ENCOUNTER — Other Ambulatory Visit: Payer: Self-pay

## 2019-06-16 ENCOUNTER — Encounter: Payer: Self-pay | Admitting: Internal Medicine

## 2019-06-16 VITALS — BP 134/88 | HR 70 | Temp 98.1°F | Ht 61.0 in | Wt 123.4 lb

## 2019-06-16 DIAGNOSIS — G8929 Other chronic pain: Secondary | ICD-10-CM

## 2019-06-16 DIAGNOSIS — I2510 Atherosclerotic heart disease of native coronary artery without angina pectoris: Secondary | ICD-10-CM

## 2019-06-16 DIAGNOSIS — F1120 Opioid dependence, uncomplicated: Secondary | ICD-10-CM | POA: Diagnosis not present

## 2019-06-16 DIAGNOSIS — M542 Cervicalgia: Secondary | ICD-10-CM | POA: Diagnosis not present

## 2019-06-16 MED ORDER — HYDROCODONE-ACETAMINOPHEN 5-325 MG PO TABS: 1.0000 | ORAL_TABLET | Freq: Two times a day (BID) | ORAL | 0 refills | Status: DC | PRN

## 2019-06-16 NOTE — Patient Instructions (Signed)
We have sent in the refills today.

## 2019-06-16 NOTE — Assessment & Plan Note (Signed)
[place] narcotic database reviewed and appropriate. No early fills. Last UDS appropriate and in appropriate time interval. Refill hydrocodone today #60 per month and needs follow up in 3 months.

## 2019-06-16 NOTE — Assessment & Plan Note (Signed)
[place] narcotic database reviewed and appropriate. Last UDS reviewed and appropriate in content and timing. Refill hydrocodone 5/325 #60 per month today. Needs follow up in 3 months. Pain contract on file.

## 2019-06-16 NOTE — Assessment & Plan Note (Signed)
No injections recently as her provider there has left but will be talking to sports medicine about alternate options. Uses hydrocodone per contract BID and otc medications as well.

## 2019-06-16 NOTE — Progress Notes (Signed)
   Subjective:   Patient ID: Erica [name], female    DOB: 06-15-1950, 69 y.o.   MRN: [id number]  HPI The patient is a 69 YO female coming in for chronic pain management (due to back/neck pain, gets injections intermittently as well as hydrocodone BID and otc medications, this allows her to be active, no early fills, denies overuse, prior UDS appropriate) and back/neck pain (doing worse overall due to her physician who used to give her injections in low back and neck is no longer at the place who does her injections so she has not had any in some time, she is dealing with the pain, used hydrocodone BID and otc in between, still very active and her pain medication allows her to be active) and narcotic dependence (taking her hydrocodone as prescribed and per contract, denies overuse, denies side effects including constipation, this allows her to be active).   Review of Systems  Constitutional: Negative.  Negative for appetite change, chills, fatigue, fever and unexpected weight change.  HENT: Negative.   Eyes: Negative.   Respiratory: Negative.  Negative for cough, chest tightness and shortness of breath.   Cardiovascular: Negative.  Negative for chest pain, palpitations and leg swelling.  Gastrointestinal: Negative.  Negative for abdominal distention, abdominal pain, constipation, diarrhea, nausea and vomiting.  Musculoskeletal: Positive for arthralgias, back pain and neck pain. Negative for gait problem and joint swelling.  Skin: Negative.   Neurological: Negative.   Psychiatric/Behavioral: Negative.     Objective:  Physical Exam Constitutional:      Appearance: She is well-developed.  HENT:     Head: Normocephalic and atraumatic.  Cardiovascular:     Rate and Rhythm: Normal rate and regular rhythm.  Pulmonary:     Effort: Pulmonary effort is normal. No respiratory distress.     Breath sounds: Normal breath sounds. No wheezing or rales.  Abdominal:     General: Bowel sounds are  normal. There is no distension.     Palpations: Abdomen is soft.     Tenderness: There is no abdominal tenderness. There is no rebound.  Musculoskeletal:        General: Tenderness present.     Cervical back: Normal range of motion.  Skin:    General: Skin is warm and dry.  Neurological:     Mental Status: She is alert and oriented to person, place, and time.     Coordination: Coordination normal.     Vitals:   06/16/19 0852  BP: 134/88  Pulse: 70  Temp: 98.1 F (36.7 C)  TempSrc: Oral  SpO2: 98%  Weight: 123 lb 6 oz (56 kg)  Height: 5' 1"  (1.549 m)    This visit occurred during the SARS-CoV-2 public health emergency.  Safety protocols were in place, including screening questions prior to the visit, additional usage of staff PPE, and extensive cleaning of exam room while observing appropriate contact time as indicated for disinfecting solutions.   Assessment & Plan:

## 2019-06-18 ENCOUNTER — Ambulatory Visit: Payer: Medicare Other | Admitting: Internal Medicine

## 2019-06-18 DIAGNOSIS — F99 Mental disorder, not otherwise specified: Secondary | ICD-10-CM | POA: Diagnosis not present

## 2019-06-25 ENCOUNTER — Ambulatory Visit (INDEPENDENT_AMBULATORY_CARE_PROVIDER_SITE_OTHER): Payer: Medicare Other

## 2019-06-25 ENCOUNTER — Other Ambulatory Visit: Payer: Self-pay | Admitting: Internal Medicine

## 2019-06-25 DIAGNOSIS — Z1159 Encounter for screening for other viral diseases: Secondary | ICD-10-CM | POA: Diagnosis not present

## 2019-06-25 DIAGNOSIS — F99 Mental disorder, not otherwise specified: Secondary | ICD-10-CM | POA: Diagnosis not present

## 2019-06-25 LAB — SARS CORONAVIRUS 2 (TAT 6-24 HRS): SARS Coronavirus 2: NEGATIVE

## 2019-06-27 ENCOUNTER — Ambulatory Visit (AMBULATORY_SURGERY_CENTER): Payer: Medicare Other | Admitting: Internal Medicine

## 2019-06-27 ENCOUNTER — Encounter: Payer: Self-pay | Admitting: Internal Medicine

## 2019-06-27 ENCOUNTER — Other Ambulatory Visit: Payer: Self-pay

## 2019-06-27 VITALS — BP 133/73 | HR 70 | Temp 96.9°F | Resp 19 | Ht 61.0 in | Wt 125.0 lb

## 2019-06-27 DIAGNOSIS — K5000 Crohn's disease of small intestine without complications: Secondary | ICD-10-CM

## 2019-06-27 DIAGNOSIS — Z85038 Personal history of other malignant neoplasm of large intestine: Secondary | ICD-10-CM

## 2019-06-27 DIAGNOSIS — K5090 Crohn's disease, unspecified, without complications: Secondary | ICD-10-CM | POA: Diagnosis not present

## 2019-06-27 MED ORDER — SODIUM CHLORIDE 0.9 % IV SOLN: 500.0000 mL | Freq: Once | INTRAVENOUS | Status: DC

## 2019-06-27 NOTE — Progress Notes (Signed)
Pt. Reports no change in her medical or surgical history since her pre-visit 06/13/2019.

## 2019-06-27 NOTE — Op Note (Signed)
[facility] Patient Name: Erica [name] Procedure Date: 06/27/2019 10:25 AM MRN: [id number] Endoscopist: Gatha Mayer , MD Age: 69 Referring MD:  Date of Birth: [birth year] Gender: Female Account #: [id number] Procedure:                Colonoscopy Indications:              High risk colon cancer surveillance: Personal                            history of colon cancer, Last colonoscopy: 2017 Medicines:                Propofol per Anesthesia, Monitored Anesthesia Care Procedure:                Pre-Anesthesia Assessment:                           - Prior to the procedure, a History and Physical                            was performed, and patient medications and                            allergies were reviewed. The patient's tolerance of                            previous anesthesia was also reviewed. The risks                            and benefits of the procedure and the sedation                            options and risks were discussed with the patient.                            All questions were answered, and informed consent                            was obtained. Prior Anticoagulants: The patient has                            taken no previous anticoagulant or antiplatelet                            agents. ASA Grade Assessment: III - A patient with                            severe systemic disease. After reviewing the risks                            and benefits, the patient was deemed in                            satisfactory condition to undergo the procedure.  After obtaining informed consent, the colonoscope                            was passed under direct vision. Throughout the                            procedure, the patient's blood pressure, pulse, and                            oxygen saturations were monitored continuously. The                            Colonoscope was introduced through the anus and                 advanced to the the ileocolonic anastomosis. The                            colonoscopy was somewhat difficult due to a                            tortuous colon. Successful completion of the                            procedure was aided by straightening and shortening                            the scope to obtain bowel loop reduction and                            lavage. The patient tolerated the procedure well.                            The quality of the bowel preparation was good. The                            bowel preparation used was Miralax via split dose                            instruction. The ileocecal valve, appendiceal                            orifice, and rectum were photographed. Scope In: 10:31:54 AM Scope Out: 10:46:04 AM Scope Withdrawal Time: 0 hours 7 minutes 49 seconds  Total Procedure Duration: 0 hours 14 minutes 10 seconds  Findings:                 The perianal and digital rectal examinations were                            normal.                           There was evidence of a patent end-to-side  ileo-colonic anastomosis found in the surgical                            anastomosis. This was traversed.                           Inflammation characterized by congestion (edema),                            erosions, erythema, friability and granularity was                            found.                           Multiple diverticula were found in the sigmoid                            colon.                           The exam was otherwise without abnormality on                            direct and retroflexion views. Complications:            No immediate complications. Estimated Blood Loss:     Estimated blood loss: none. Impression:               - Patent end-to-side ileo-colonic anastomosis.                           - Crohn's disease with ileitis. Inflammation was                            found.                            - Diverticulosis in the sigmoid colon.                           - The examination was otherwise normal on direct                            and retroflexion views.                           - No specimens collected. Recommendation:           - Patient has a contact number available for                            emergencies. The signs and symptoms of potential                            delayed complications were discussed with the  patient. Return to normal activities tomorrow.                            Written discharge instructions were provided to the                            patient.                           - Resume previous diet.                           - Continue present medications.                           - Repeat colonoscopy in 3 years for surveillance.                            Closer f/u due to intreated Crohn's Gatha Mayer, MD 06/27/2019 10:58:30 AM This report has been signed electronically.

## 2019-06-27 NOTE — Progress Notes (Signed)
To PACU, VSS. Report to [email or web address] 

## 2019-06-27 NOTE — Patient Instructions (Addendum)
No polyps seen.  The Crohn's looks similar to before.  Repeat in 3 years.  I appreciate the opportunity to care for you. Gatha Mayer, MD, [facility]  Read all of the handouts given to you by your recovery room nurse.  Thank-you for choosing [place] for your healthcare needs today.   YOU HAD AN ENDOSCOPIC PROCEDURE TODAY AT [facility]:   Refer to the procedure report that was given to you for any specific questions about what was found during the examination.  If the procedure report does not answer your questions, please call your gastroenterologist to clarify.  If you requested that your care partner not be given the details of your procedure findings, then the procedure report has been included in a sealed envelope for you to review at your convenience later.  YOU SHOULD EXPECT: Some feelings of bloating in the abdomen. Passage of more gas than usual.  Walking can help get rid of the air that was put into your GI tract during the procedure and reduce the bloating. If you had a lower endoscopy (such as a colonoscopy or flexible sigmoidoscopy) you may notice spotting of blood in your stool or on the toilet paper. If you underwent a bowel prep for your procedure, you may not have a normal bowel movement for a few days.  Please Note:  You might notice some irritation and congestion in your nose or some drainage.  This is from the oxygen used during your procedure.  There is no need for concern and it should clear up in a day or so.  SYMPTOMS TO REPORT IMMEDIATELY:   Following lower endoscopy (colonoscopy or flexible sigmoidoscopy):  Excessive amounts of blood in the stool  Significant tenderness or worsening of abdominal pains  Swelling of the abdomen that is new, acute  Fever of 100F or higher   For urgent or emergent issues, a gastroenterologist can be reached at any hour by calling [phone]. Do not use MyChart messaging for urgent concerns.    DIET:  We do recommend  a small meal at first, but then you may proceed to your regular diet.  Drink plenty of fluids but you should avoid alcoholic beverages for 24 hours.  ACTIVITY:  You should plan to take it easy for the rest of today and you should NOT DRIVE or use heavy machinery until tomorrow (because of the sedation medicines used during the test).    FOLLOW UP: Our staff will call the number listed on your records 48-72 hours following your procedure to check on you and address any questions or concerns that you may have regarding the information given to you following your procedure. If we do not reach you, we will leave a message.  We will attempt to reach you two times.  During this call, we will ask if you have developed any symptoms of COVID 19. If you develop any symptoms (ie: fever, flu-like symptoms, shortness of breath, cough etc.) before then, please call [phone].  If you test positive for Covid 19 in the 2 weeks post procedure, please call and report this information to [place].    If any biopsies were taken you will be contacted by phone or by letter within the next 1-3 weeks.  Please call us at [phone] if you have not heard about the biopsies in 3 weeks.    SIGNATURES/CONFIDENTIALITY: You and/or your care partner have signed paperwork which will be entered into your electronic medical record.  These  signatures attest to the fact that that the information above on your After Visit Summary has been reviewed and is understood.  Full responsibility of the confidentiality of this discharge information lies with you and/or your care-partner.

## 2019-07-01 ENCOUNTER — Telehealth: Payer: Self-pay | Admitting: *Deleted

## 2019-07-01 NOTE — Telephone Encounter (Signed)
  Follow up Call-  Call back number 06/27/2019 03/29/2017  Post procedure Call Back phone  # [phone]  Permission to leave phone message Yes Yes  Some recent data might be hidden     Patient questions:  Do you have a fever, pain , or abdominal swelling? No. Pain Score  0 *  Have you tolerated food without any problems? Yes.    Have you been able to return to your normal activities? Yes.    Do you have any questions about your discharge instructions: Diet   No. Medications  No. Follow up visit  No.  Do you have questions or concerns about your Care? No.  Actions: * If pain score is 4 or above: No action needed, pain <4.  1. Have you developed a fever since your procedure? no  2.   Have you had an respiratory symptoms (SOB or cough) since your procedure? no  3.   Have you tested positive for COVID 19 since your procedure no  4.   Have you had any family members/close contacts diagnosed with the COVID 19 since your procedure?  no   If yes to any of these questions please route to Joylene John, RN and Erenest Rasher, RN

## 2019-07-08 NOTE — Progress Notes (Signed)
Cardiology Office Note:    Date:  07/09/2019   ID:  Erica [name], DOB 1950/04/25, MRN [id number]  PCP:  [name], MD  Cardiologist:  Sinclair Grooms, MD   Referring MD: [name], *   Chief Complaint  Patient presents with  . Coronary Artery Disease    History of Present Illness:    Erica [name] is a 69 y.o. female with a hx of stress-induced cardiomyopathy 2010, heavy tobacco use , hyperlipidemia, hypertension, an history of chronic diastolic heart failure.   No cardiac symptoms, chest pain, or episodes of heart failure.  Secondary prevention is being practiced.  Rosuvastatin 10 mg on Monday Wednesday and Friday was recently started.  She has intolerance to daily statin therapy.  She will have blood work done this morning.  Most recent LDL was 93.    Past Medical History:  Diagnosis Date  . Acute duodenitis 04/24/2017  . Allergy   . ANEMIA   . Anxiety   . BACK PAIN, LUMBAR   . Cancer of ascending colon [id number] s/p colectomy 03/05/2009 02/18/2010   Qualifier: Diagnosis of  By: Carlean Purl MD, Dimas Millin   . CHF (congestive heart failure) ([place])   . Complete rotator cuff tear of left shoulder 11/27/2013   Ultrasound guided injection on November 27, 2013   . COPD (chronic obstructive pulmonary disease) with emphysema ([place])   . COPD exacerbation ([place]) 11/14/2017  . Crohn's  02/2010   ileal ulcers, intol of entercort--refuses treatment  . Emphysema of lung ([place])   . GERD   . History of blood transfusion    w/ c/s surgery  . History of transfusion of whole blood   . HYPERLIPIDEMIA   . HYPERTENSION   . Microscopic hematuria    chronic  . Myocardial infarction ([place]) 2010  . Obstruction of intestine or colon (H[facility])    adhesions  . OSTEOARTHRITIS   . Rectal fissure    Possible fissure  . Rotator cuff tear   . Takotsubo syndrome 12/2008  . URINARY INCONTINENCE     Past Surgical History:  Procedure Laterality Date  . ABDOMINAL HYSTERECTOMY   1994  . APPENDECTOMY  1964  . BLADDER SUSPENSION    . CESAREAN SECTION     x 3  . CHOLECYSTECTOMY  1995  . COLONOSCOPY  2017  . COLONOSCOPY W/ BIOPSIES AND POLYPECTOMY  01/24/2011   (Crohn's)ileitis, internal hemorrhoids  . ECTOPIC PREGNANCY SURGERY    . ESOPHAGOGASTRODUODENOSCOPY    . FACIAL COSMETIC SURGERY    . HAND SURGERY Bilateral 1991   x 2  . KNEE SURGERY Right 1981  . LYSIS OF ADHESION  2010   ex lap/LOA for SBO Dr Excell Seltzer  . ORBITAL FRACTURE SURGERY  1990  . RIGHT COLECTOMY  03/2009   Right colectomy for colon CA.  Dr Donne Hazel  . TONSILLECTOMY  1952  . TOTAL KNEE ARTHROPLASTY  03/2010   Dr Alvan Dame.  Dep[place]  . UPPER GASTROINTESTINAL ENDOSCOPY  05/16/2010   normal  . UTERINE SUSPENSION    . VIDEO BRONCHOSCOPY Bilateral 12/18/2014   Procedure: VIDEO BRONCHOSCOPY WITHOUT FLUORO;  Surgeon: Tanda Rockers, MD;  Location: [facility];  Service: Cardiopulmonary;  Laterality: Bilateral;    Current Medications: Current Meds  Medication Sig  . albuterol (PROVENTIL) (2.5 MG/3ML) 0.083% nebulizer solution Take 3 mLs (2.5 mg total) by nebulization every 6 (six) hours as needed for wheezing or shortness of breath.  [name] albuterol (VENTOLIN HFA) 108 (90 Base) MCG/ACT  inhaler INHALE 2 PUFFS BY MOUTH EVERY 4 HOURS AS NEEDED FOR WHEEZING OR SHORTNESS OF BREATH  . aspirin EC 81 MG tablet Take 81 mg by mouth daily.  . cyclobenzaprine (FLEXERIL) 10 MG tablet Take 1 tablet by mouth three times daily as needed for muscle spasm  . fluticasone (FLONASE) 50 MCG/ACT nasal spray Place 2 sprays into both nostrils daily.  [name] HYDROcodone-acetaminophen (NORCO/VICODIN) 5-325 MG tablet Take 1 tablet by mouth 2 (two) times daily as needed for moderate pain (pain).  . metoprolol tartrate (LOPRESSOR) 25 MG tablet Take 1 tablet by mouth twice daily  . rosuvastatin (CRESTOR) 10 MG tablet Take 1 tablet by mouth three times a week on Monday, Wednesday, and Friday  . senna (SENOKOT) 8.6 MG TABS tablet Take 1 tablet  (8.6 mg total) by mouth daily as needed for mild constipation.     Allergies:   Morphine   Social History   Socioeconomic History  . Marital status: Married    Spouse name: Not on file  . Number of children: Not on file  . Years of education: Not on file  . Highest education level: Not on file  Occupational History  . Occupation: Retired     Fish farm manager: UNEMPLOYED  Tobacco Use  . Smoking status: Current Every Day Smoker    Packs/day: 1.00    Years: 53.00    Pack years: 53.00    Types: Cigarettes  . Smokeless tobacco: Never Used  Substance and Sexual Activity  . Alcohol use: Yes    Comment: occasional mixed drink  . Drug use: No  . Sexual activity: Not Currently    Birth control/protection: Surgical    Comment: Hysterectomy  Other Topics Concern  . Not on file  Social History Narrative   Lives with husband and 2 grand dtr; 6 and 54   Social Determinants of Health   Financial Resource Strain:   . Difficulty of Paying Living Expenses:   Food Insecurity:   . Worried About Charity fundraiser in the Last Year:   . Arboriculturist in the Last Year:   Transportation Needs:   . Film/video editor (Medical):   [name] Lack of Transportation (Non-Medical):   Physical Activity:   . Days of Exercise per Week:   . Minutes of Exercise per Session:   Stress:   . Feeling of Stress :   Social Connections:   . Frequency of Communication with Friends and Family:   . Frequency of Social Gatherings with Friends and Family:   . Attends Religious Services:   . Active Member of Clubs or Organizations:   . Attends Archivist Meetings:   [name] Marital Status:      Family History: The patient's family history includes Arthritis in an other family member; Colon cancer (age of onset: 56) in her sister; Colon cancer (age of onset: 47) in her father; Heart disease in her father; Hypertension in her father; Kidney disease in her father; Liver cancer in her sister; Other in her sister;  Throat cancer in her mother. There is no history of Stomach cancer or Rectal cancer.  ROS:   Please see the history of present illness.    Tough year for her.  Her husband died in 08/16/18, her remaining sibling died in 16-Aug-2018, she has been isolated, and overall missing her husband of 9 years immensely.  Recent scans done to detect evidence of metastatic colon cancer were negative  She has not yet  received the vaccine.  She is hesitant for reasons that are unclear.  She wants to get [employer] vaccine.  Discussed how to go on to the portal of my chart to register.  All other systems reviewed and are negative.  EKGs/Labs/Other Studies Reviewed:    The following studies were reviewed today: No new functional or imaging data  EKG:  EKG normal sinus rhythm, poor R wave progression V1 through V4.  Slight left axis deviation.  When compared to the tracing from 2021 no changes occurred.  Recent Labs: 12/05/2018: BUN 20; Creatinine, Ser 0.75; Hemoglobin 13.0; Platelets 267.0; Potassium 3.9; Sodium 140 05/01/2019: ALT 11  Recent Lipid Panel    Component Value Date/Time   CHOL 190 05/01/2019 0857   TRIG 161 (H) 05/01/2019 0857   HDL 62 05/01/2019 0857   CHOLHDL 3.1 05/01/2019 0857   CHOLHDL 3 12/05/2018 0805   VLDL 36.2 12/05/2018 0805   LDLCALC 100 (H) 05/01/2019 0857   LDLDIRECT 138.5 09/30/2012 1518    Physical Exam:    VS:  BP 124/78   Pulse 82   Ht 5' 1"  (1.549 m)   Wt 123 lb 6.4 oz (56 kg)   LMP  (LMP Unknown)   SpO2 96%   BMI 23.32 kg/m     Wt Readings from Last 3 Encounters:  07/09/19 123 lb 6.4 oz (56 kg)  06/27/19 125 lb (56.7 kg)  06/16/19 123 lb 6 oz (56 kg)     GEN: Slender and appears stable.  48-year-old a year.. No acute distress HEENT: Normal NECK: No JVD. LYMPHATICS: No lymphadenopathy CARDIAC:  RRR without murmur, gallop, or edema. VASCULAR:  Normal Pulses. No bruits. RESPIRATORY:  Clear to auscultation without rales, wheezing or rhonchi  ABDOMEN:  Soft, non-tender, non-distended, No pulsatile mass, MUSCULOSKELETAL: No deformity  SKIN: Warm and dry NEUROLOGIC:  Alert and oriented x 3 PSYCHIATRIC:  Normal affect   ASSESSMENT:    1. Coronary artery calcification seen on CAT scan   2. Mixed hyperlipidemia   3. Chronic diastolic HF (heart failure) ([place])   4. Takotsubo syndrome   5. Essential hypertension   6. COPD  GOLD II    7. Educated about COVID-19 virus infection    PLAN:    In order of problems listed above:  1. Her only primary cardiac event was an episode of Takotsubo syndrome.  She has never had primary ischemic event related to coronary atherosclerosis.  Primary/secondary prevention is discussed.  LDL target less than 70 is important to achieve. 2. Lipid panel is being done today. 3. No evidence of volume overload. 4. No recurrence 5. Excellent blood pressure control 6. Recent scan of the lungs did not demonstrate evidence of lung cancer. 7. I advised her to get the COVID-19 vaccine.  She states she will call the portal today and attempt to register to get [employer] vaccine.  Overall education and awareness concerning primary/secondary risk prevention was discussed in detail: LDL less than 70, hemoglobin A1c less than 7, blood pressure target less than 130/80 mmHg, >150 minutes of moderate aerobic activity per week, avoidance of smoking, weight control (via diet and exercise), and continued surveillance/management of/for obstructive sleep apnea.  If LDL is still elevated, consider adding Zetia.   Medication Adjustments/Labs and Tests Ordered: Current medicines are reviewed at length with the patient today.  Concerns regarding medicines are outlined above.  Orders Placed This Encounter  Procedures  . EKG 12-Lead   No orders of the defined types were  placed in this encounter.   There are no Patient Instructions on file for this visit.   Signed, Sinclair Grooms, MD  07/09/2019 8:22 AM    [place]

## 2019-07-09 ENCOUNTER — Ambulatory Visit (INDEPENDENT_AMBULATORY_CARE_PROVIDER_SITE_OTHER): Payer: Medicare Other | Admitting: Interventional Cardiology

## 2019-07-09 ENCOUNTER — Other Ambulatory Visit: Payer: Self-pay

## 2019-07-09 ENCOUNTER — Other Ambulatory Visit: Payer: Medicare Other | Admitting: *Deleted

## 2019-07-09 ENCOUNTER — Encounter: Payer: Self-pay | Admitting: Interventional Cardiology

## 2019-07-09 VITALS — BP 124/78 | HR 82 | Ht 61.0 in | Wt 123.4 lb

## 2019-07-09 DIAGNOSIS — E782 Mixed hyperlipidemia: Secondary | ICD-10-CM

## 2019-07-09 DIAGNOSIS — I5181 Takotsubo syndrome: Secondary | ICD-10-CM | POA: Diagnosis not present

## 2019-07-09 DIAGNOSIS — Z7189 Other specified counseling: Secondary | ICD-10-CM

## 2019-07-09 DIAGNOSIS — J449 Chronic obstructive pulmonary disease, unspecified: Secondary | ICD-10-CM

## 2019-07-09 DIAGNOSIS — I5032 Chronic diastolic (congestive) heart failure: Secondary | ICD-10-CM

## 2019-07-09 DIAGNOSIS — I10 Essential (primary) hypertension: Secondary | ICD-10-CM | POA: Diagnosis not present

## 2019-07-09 DIAGNOSIS — I2510 Atherosclerotic heart disease of native coronary artery without angina pectoris: Secondary | ICD-10-CM

## 2019-07-09 LAB — HEPATIC FUNCTION PANEL
ALT: 15 IU/L (ref 0–32)
AST: 21 IU/L (ref 0–40)
Albumin: 4.4 g/dL (ref 3.8–4.8)
Alkaline Phosphatase: 84 IU/L (ref 39–117)
Bilirubin Total: 0.3 mg/dL (ref 0.0–1.2)
Bilirubin, Direct: 0.1 mg/dL (ref 0.00–0.40)
Total Protein: 6.7 g/dL (ref 6.0–8.5)

## 2019-07-09 LAB — LIPID PANEL
Chol/HDL Ratio: 2.8 ratio (ref 0.0–4.4)
Cholesterol, Total: 184 mg/dL (ref 100–199)
HDL: 66 mg/dL (ref >39)
LDL Chol Calc (NIH): 93 mg/dL (ref 0–99)
Triglycerides: 144 mg/dL (ref 0–149)
VLDL Cholesterol Cal: 25 mg/dL (ref 5–40)

## 2019-07-09 NOTE — Patient Instructions (Signed)

## 2019-07-10 ENCOUNTER — Telehealth: Payer: Self-pay

## 2019-07-10 ENCOUNTER — Ambulatory Visit: Payer: Medicare Other | Attending: Internal Medicine

## 2019-07-10 DIAGNOSIS — E782 Mixed hyperlipidemia: Secondary | ICD-10-CM

## 2019-07-10 DIAGNOSIS — Z23 Encounter for immunization: Secondary | ICD-10-CM

## 2019-07-10 DIAGNOSIS — I2510 Atherosclerotic heart disease of native coronary artery without angina pectoris: Secondary | ICD-10-CM

## 2019-07-10 MED ORDER — ROSUVASTATIN CALCIUM 10 MG PO TABS: ORAL_TABLET | ORAL | 1 refills | Status: DC

## 2019-07-10 NOTE — Telephone Encounter (Signed)
-----   Message from Liliane Shi, PA-C sent at 07/09/2019  5:12 PM EDT ----- LDL above target.  Goal LDL is <70.  LFTs normal. PLAN:  -Ask patient if she could tolerate rosuvastatin -If she is still taking it, increase to 10 mg once daily. -If she stopped it for side effects, start ezetimibe 10 mg daily -Repeat fasting lipids, LFTs in 3 months -Pt saw Dr. [name] today - I will fwd to him as FYI as well Richardson Dopp, PA-C    07/09/2019 5:07 PM

## 2019-07-10 NOTE — Telephone Encounter (Signed)
The patient has been notified of the result and verbalized understanding.  All questions (if any) were answered. New prescriptions for Crestor sent in and follow up labs scheduled for 10/10/19.  Mady Haagensen, [place] 07/10/2019 10:19 AM

## 2019-07-10 NOTE — Progress Notes (Signed)
   Covid-19 Vaccination Clinic  Name:  Erica [name]    MRN: [id number] DOB: [birth year]  07/10/2019  [name] was observed post Covid-19 immunization for 15 minutes without incident. She was provided with Vaccine Information Sheet and instruction to access the V-Safe system.   [name] was instructed to call 911 with any severe reactions post vaccine: [name] Difficulty breathing  . Swelling of face and throat  . A fast heartbeat  . A bad rash all over body  . Dizziness and weakness   Immunizations Administered    Name Date Dose VIS Date Route   Pfizer COVID-19 Vaccine 07/10/2019 10:05 AM 0.3 mL 03/14/2019 Intramuscular   Manufacturer: [place]   Lot: [id number]   [place]: [id number]

## 2019-07-16 DIAGNOSIS — F99 Mental disorder, not otherwise specified: Secondary | ICD-10-CM | POA: Diagnosis not present

## 2019-07-21 ENCOUNTER — Telehealth: Payer: Self-pay

## 2019-07-21 MED ORDER — HYDROCODONE-ACETAMINOPHEN 5-325 MG PO TABS: 1.0000 | ORAL_TABLET | Freq: Two times a day (BID) | ORAL | 0 refills | Status: DC | PRN

## 2019-07-21 NOTE — Telephone Encounter (Signed)
1.Medication Requested:HYDROcodone-acetaminophen (NORCO/VICODIN) 5-325 MG tablet  2. Pharmacy (Name, Street, [place]):[facility], [place] [address]  3. On Med List: Yes   4. Last Visit with PCP: 3.15.21   5. Next visit date with PCP: 6.17.2021    Agent: Please be advised that RX refills may take up to 3 business days. We ask that you follow-up with your pharmacy.

## 2019-07-21 NOTE — Telephone Encounter (Signed)
Refill done.  

## 2019-07-21 NOTE — Telephone Encounter (Signed)
Check [place] registry last filled 06/17/2019.Marland KitchenJohny [name]

## 2019-07-23 DIAGNOSIS — F99 Mental disorder, not otherwise specified: Secondary | ICD-10-CM | POA: Diagnosis not present

## 2019-07-30 ENCOUNTER — Ambulatory Visit: Payer: Medicare Other

## 2019-07-30 DIAGNOSIS — F99 Mental disorder, not otherwise specified: Secondary | ICD-10-CM | POA: Diagnosis not present

## 2019-08-04 ENCOUNTER — Ambulatory Visit: Payer: Medicare Other | Attending: Internal Medicine

## 2019-08-04 DIAGNOSIS — Z23 Encounter for immunization: Secondary | ICD-10-CM

## 2019-08-04 NOTE — Progress Notes (Signed)
   Covid-19 Vaccination Clinic  Name:  Briella Hobday    MRN: [id number] DOB: 25-Feb-1951  08/04/2019  Ms. Stolze was observed post Covid-19 immunization for 15 minutes without incident. She was provided with Vaccine Information Sheet and instruction to access the V-Safe system.   Ms. Boutin was instructed to call 911 with any severe reactions post vaccine: [name] Difficulty breathing  . Swelling of face and throat  . A fast heartbeat  . A bad rash all over body  . Dizziness and weakness   Immunizations Administered    Name Date Dose VIS Date Route   Pfizer COVID-19 Vaccine 08/04/2019  8:09 AM 0.3 mL 05/28/2018 Intramuscular   Manufacturer: [place]   Lot: [id number]   [place]: [id number]

## 2019-08-06 DIAGNOSIS — F99 Mental disorder, not otherwise specified: Secondary | ICD-10-CM | POA: Diagnosis not present

## 2019-08-13 DIAGNOSIS — F99 Mental disorder, not otherwise specified: Secondary | ICD-10-CM | POA: Diagnosis not present

## 2019-08-18 ENCOUNTER — Telehealth: Payer: Self-pay | Admitting: Internal Medicine

## 2019-08-18 NOTE — Telephone Encounter (Signed)
° ° °  1.Medication Requested:HYDROcodone-acetaminophen (NORCO/VICODIN) 5-325 MG tablet  2. Pharmacy (Name, Street, [place]):[facility], [place] [address]  3. On Med List: yes  4. Last Visit with PCP: 06/16/19  5. Next visit date with PCP:   Agent: Please be advised that RX refills may take up to 3 business days. We ask that you follow-up with your pharmacy.

## 2019-08-19 MED ORDER — HYDROCODONE-ACETAMINOPHEN 5-325 MG PO TABS: 1.0000 | ORAL_TABLET | Freq: Two times a day (BID) | ORAL | 0 refills | Status: DC | PRN

## 2019-08-20 NOTE — Telephone Encounter (Signed)
Refill sent to pharmacy on 08-19-2019

## 2019-08-21 DIAGNOSIS — F99 Mental disorder, not otherwise specified: Secondary | ICD-10-CM | POA: Diagnosis not present

## 2019-08-28 DIAGNOSIS — F99 Mental disorder, not otherwise specified: Secondary | ICD-10-CM | POA: Diagnosis not present

## 2019-09-04 DIAGNOSIS — F99 Mental disorder, not otherwise specified: Secondary | ICD-10-CM | POA: Diagnosis not present

## 2019-09-05 ENCOUNTER — Ambulatory Visit (INDEPENDENT_AMBULATORY_CARE_PROVIDER_SITE_OTHER): Payer: Medicare Other

## 2019-09-05 DIAGNOSIS — Z0000 Encounter for general adult medical examination without abnormal findings: Secondary | ICD-10-CM

## 2019-09-05 NOTE — Patient Instructions (Signed)
[name] , Thank you for taking time to come for your Medicare Wellness Visit. I appreciate your ongoing commitment to your health goals. Please review the following plan we discussed and let me know if I can assist you in the future.   Screening recommendations/referrals: Colonoscopy: 06/27/2019; due every 3 years Mammogram: 02/08/2011; needs to be scheduled with [facility]; Patient will talk with provider at next visit Bone Density: 12/22/2016 Recommended yearly ophthalmology/optometry visit for glaucoma screening and checkup Recommended yearly dental visit for hygiene and checkup  Vaccinations: Influenza vaccine: 12/05/2018 Pneumococcal vaccine: completed Tdap vaccine: 09/17/2014; due every 10 years Shingles vaccine: never done   Covid-19: completed  Advanced directives: No; denied paperwork  Conditions/risks identified: Yes  Next appointment: Please schedule your 1 year Medicare Wellness Visit with your Health Coach.   Preventive Care 2 Years and Older, Female Preventive care refers to lifestyle choices and visits with your health care provider that can promote health and wellness. What does preventive care include?  A yearly physical exam. This is also called an annual well check.  Dental exams once or twice a year.  Routine eye exams. Ask your health care provider how often you should have your eyes checked.  Personal lifestyle choices, including:  Daily care of your teeth and gums.  Regular physical activity.  Eating a healthy diet.  Avoiding tobacco and drug use.  Limiting alcohol use.  Practicing safe sex.  Taking low-dose aspirin every day.  Taking vitamin and mineral supplements as recommended by your health care provider. What happens during an annual well check? The services and screenings done by your health care provider during your annual well check will depend on your age, overall health, lifestyle risk factors, and family history of  disease. Counseling  Your health care provider may ask you questions about your:  Alcohol use.  Tobacco use.  Drug use.  Emotional well-being.  Home and relationship well-being.  Sexual activity.  Eating habits.  History of falls.  Memory and ability to understand (cognition).  Work and work Statistician.  Reproductive health. Screening  You may have the following tests or measurements:  Height, weight, and BMI.  Blood pressure.  Lipid and cholesterol levels. These may be checked every 5 years, or more frequently if you are over 40 years old.  Skin check.  Lung cancer screening. You may have this screening every year starting at age 23 if you have a 30-pack-year history of smoking and currently smoke or have quit within the past 15 years.  Fecal occult blood test (FOBT) of the stool. You may have this test every year starting at age 62.  Flexible sigmoidoscopy or colonoscopy. You may have a sigmoidoscopy every 5 years or a colonoscopy every 10 years starting at age 73.  Hepatitis C blood test.  Hepatitis B blood test.  Sexually transmitted disease (STD) testing.  Diabetes screening. This is done by checking your blood sugar (glucose) after you have not eaten for a while (fasting). You may have this done every 1-3 years.  Bone density scan. This is done to screen for osteoporosis. You may have this done starting at age 2.  Mammogram. This may be done every 1-2 years. Talk to your health care provider about how often you should have regular mammograms. Talk with your health care provider about your test results, treatment options, and if necessary, the need for more tests. Vaccines  Your health care provider may recommend certain vaccines, such as:  Influenza vaccine. This  is recommended every year.  Tetanus, diphtheria, and acellular pertussis (Tdap, Td) vaccine. You may need a Td booster every 10 years.  Zoster vaccine. You may need this after age  29.  Pneumococcal 13-valent conjugate (PCV13) vaccine. One dose is recommended after age 37.  Pneumococcal polysaccharide (PPSV23) vaccine. One dose is recommended after age 52. Talk to your health care provider about which screenings and vaccines you need and how often you need them. This information is not intended to replace advice given to you by your health care provider. Make sure you discuss any questions you have with your health care provider. Document Released: 04/16/2015 Document Revised: 12/08/2015 Document Reviewed: 01/19/2015 Elsevier Interactive Patient Education  2017 [place] Prevention in the Home Falls can cause injuries. They can happen to people of all ages. There are many things you can do to make your home safe and to help prevent falls. What can I do on the outside of my home?  Regularly fix the edges of walkways and driveways and fix any cracks.  Remove anything that might make you trip as you walk through a door, such as a raised step or threshold.  Trim any bushes or trees on the path to your home.  Use bright outdoor lighting.  Clear any walking paths of anything that might make someone trip, such as rocks or tools.  Regularly check to see if handrails are loose or broken. Make sure that both sides of any steps have handrails.  Any raised decks and porches should have guardrails on the edges.  Have any leaves, snow, or ice cleared regularly.  Use sand or salt on walking paths during winter.  Clean up any spills in your garage right away. This includes oil or grease spills. What can I do in the bathroom?  Use night lights.  Install grab bars by the toilet and in the tub and shower. Do not use towel bars as grab bars.  Use non-skid mats or decals in the tub or shower.  If you need to sit down in the shower, use a plastic, non-slip stool.  Keep the floor dry. Clean up any water that spills on the floor as soon as it happens.  Remove  soap buildup in the tub or shower regularly.  Attach bath mats securely with double-sided non-slip rug tape.  Do not have throw rugs and other things on the floor that can make you trip. What can I do in the bedroom?  Use night lights.  Make sure that you have a light by your bed that is easy to reach.  Do not use any sheets or blankets that are too big for your bed. They should not hang down onto the floor.  Have a firm chair that has side arms. You can use this for support while you get dressed.  Do not have throw rugs and other things on the floor that can make you trip. What can I do in the kitchen?  Clean up any spills right away.  Avoid walking on wet floors.  Keep items that you use a lot in easy-to-reach places.  If you need to reach something above you, use a strong step stool that has a grab bar.  Keep electrical cords out of the way.  Do not use floor polish or wax that makes floors slippery. If you must use wax, use non-skid floor wax.  Do not have throw rugs and other things on the floor that can make you  trip. What can I do with my stairs?  Do not leave any items on the stairs.  Make sure that there are handrails on both sides of the stairs and use them. Fix handrails that are broken or loose. Make sure that handrails are as long as the stairways.  Check any carpeting to make sure that it is firmly attached to the stairs. Fix any carpet that is loose or worn.  Avoid having throw rugs at the top or bottom of the stairs. If you do have throw rugs, attach them to the floor with carpet tape.  Make sure that you have a light switch at the top of the stairs and the bottom of the stairs. If you do not have them, ask someone to add them for you. What else can I do to help prevent falls?  Wear shoes that:  Do not have high heels.  Have rubber bottoms.  Are comfortable and fit you well.  Are closed at the toe. Do not wear sandals.  If you use a  stepladder:  Make sure that it is fully opened. Do not climb a closed stepladder.  Make sure that both sides of the stepladder are locked into place.  Ask someone to hold it for you, if possible.  Clearly mark and make sure that you can see:  Any grab bars or handrails.  First and last steps.  Where the edge of each step is.  Use tools that help you move around (mobility aids) if they are needed. These include:  Canes.  Walkers.  Scooters.  Crutches.  Turn on the lights when you go into a dark area. Replace any light bulbs as soon as they burn out.  Set up your furniture so you have a clear path. Avoid moving your furniture around.  If any of your floors are uneven, fix them.  If there are any pets around you, be aware of where they are.  Review your medicines with your doctor. Some medicines can make you feel dizzy. This can increase your chance of falling. Ask your doctor what other things that you can do to help prevent falls. This information is not intended to replace advice given to you by your health care provider. Make sure you discuss any questions you have with your health care provider. Document Released: 01/14/2009 Document Revised: 08/26/2015 Document Reviewed: 04/24/2014 Elsevier Interactive Patient Education  2017 [employer].

## 2019-09-05 NOTE — Progress Notes (Signed)
I connected with Erica [name] today by telephone and verified that I am speaking with the correct person using two identifiers. Location patient: home Location provider: work Persons participating in the virtual visit: Avy Barlett and Lisette Abu, LPN  I discussed the limitations, risks, security and privacy concerns of performing an evaluation and management service by telephone and the availability of in person appointments. I also discussed with the patient that there may be a patient responsible charge related to this service. The patient expressed understanding and verbally consented to this telephonic visit.    Interactive audio and video telecommunications were attempted between this provider and patient, however failed, due to patient having technical difficulties OR patient did not have access to video capability.  We continued and completed visit with audio only.  Some vital signs may be absent or patient reported.   Time Spent with patient on telephone encounter: 35 minutes  Subjective:   Erica [name] is a 69 y.o. female who presents for Medicare Annual (Subsequent) preventive examination.  Review of Systems:  No ROS. Medicare Wellness Visit Cardiac Risk Factors include: advanced age (>98mn, >>38women);dyslipidemia;family history of premature cardiovascular disease;smoking/ tobacco exposure     Objective:     Vitals: LMP  (LMP Unknown)   There is no height or weight on file to calculate BMI.  Advanced Directives 09/05/2019 04/24/2018 04/24/2018 11/14/2017 02/14/2017 10/18/2015 08/16/2015  Does Patient Have a Medical Advance Directive? No No No No No No No  Would patient like information on creating a medical advance directive? No - Patient declined No - Patient declined No - Patient declined No - Patient declined No - Patient declined - No - patient declined information    Tobacco Social History   Tobacco Use  Smoking Status Current Every Day Smoker  .  Packs/day: 1.00  . Years: 53.00  . Pack years: 53.00  . Types: Cigarettes  Smokeless Tobacco Never Used     Ready to quit: Not Answered Counseling given: Not Answered   Clinical Intake:  Pre-visit preparation completed: Yes  Pain : 0-10 Pain Score: 10-Worst pain ever Pain Type: Chronic pain Pain Location: Back Pain Descriptors / Indicators: Discomfort, Throbbing(spinal cord injury and Right shoulder rotator cuff) Pain Onset: More than a month ago Pain Frequency: Constant Pain Relieving Factors: Pain meds and Tylenol  Pain Relieving Factors: Pain meds and Tylenol  Nutritional Risks: None Diabetes: No  How often do you need to have someone help you when you read instructions, pamphlets, or other written materials from your doctor or pharmacy?: 1 - Never What is the last grade level you completed in school?: 2 years of college  Interpreter Needed?: No  Information entered by :: Lluvia Gwynne N. HLowell Guitar LPN  Past Medical History:  Diagnosis Date  . Acute duodenitis 04/24/2017  . Allergy   . ANEMIA   . Anxiety   . BACK PAIN, LUMBAR   . Cancer of ascending colon [id number] s/p colectomy 03/05/2009 02/18/2010   Qualifier: Diagnosis of  By: GCarlean PurlMD, FDimas Millin  . CHF (congestive heart failure) ([facility]   . Complete rotator cuff tear of left shoulder 11/27/2013   Ultrasound guided injection on November 27, 2013   . COPD (chronic obstructive pulmonary disease) with emphysema ([facility]   . [place]ation [place] 11/14/2017  . Crohn's  02/2010   ileal ulcers, intol of entercort--refuses treatment  . Emphysema of lung ([facility]   . GERD   . History of blood transfusion  w/ c/s surgery  . History of transfusion of whole blood   . HYPERLIPIDEMIA   . HYPERTENSION   . Microscopic hematuria    chronic  . Myocardial infarction ([place]) 2010  . Obstruction of intestine or colon (HCC)    adhesions  . OSTEOARTHRITIS   . Rectal fissure    Possible fissure  . Rotator cuff tear   . Takotsubo  syndrome 12/2008  . URINARY INCONTINENCE    Past Surgical History:  Procedure Laterality Date  . ABDOMINAL HYSTERECTOMY  1994  . APPENDECTOMY  1964  . BLADDER SUSPENSION    . CESAREAN SECTION     x 3  . CHOLECYSTECTOMY  1995  . COLONOSCOPY  2017  . COLONOSCOPY W/ BIOPSIES AND POLYPECTOMY  01/24/2011   (Crohn's)ileitis, internal hemorrhoids  . ECTOPIC PREGNANCY SURGERY    . ESOPHAGOGASTRODUODENOSCOPY    . FACIAL COSMETIC SURGERY    . HAND SURGERY Bilateral 1991   x 2  . KNEE SURGERY Right 1981  . LYSIS OF ADHESION  2010   ex lap/LOA for SBO Dr Excell Seltzer  . ORBITAL FRACTURE SURGERY  1990  . RIGHT COLECTOMY  03/2009   Right colectomy for colon CA.  Dr Donne Hazel  . TONSILLECTOMY  1952  . TOTAL KNEE ARTHROPLASTY  03/2010   Dr Alvan Dame.  Depuy  . UPPER GASTROINTESTINAL ENDOSCOPY  05/16/2010   normal  . UTERINE SUSPENSION    . VIDEO BRONCHOSCOPY Bilateral 12/18/2014   Procedure: VIDEO BRONCHOSCOPY WITHOUT FLUORO;  Surgeon: Tanda Rockers, MD;  Location: [facility];  Service: Cardiopulmonary;  Laterality: Bilateral;   Family History  Problem Relation Age of Onset  . Colon cancer Father 83  . Hypertension Father   . Heart disease Father   . Kidney disease Father   . Colon cancer Sister 65  . Liver cancer Sister   . Other Sister        amyloidosis  . Throat cancer Mother   . Arthritis Other        Parent, other relative  . Stomach cancer Neg Hx   . Rectal cancer Neg Hx    Social History   Socioeconomic History  . Marital status: Widowed    Spouse name: Not on file  . Number of children: 5  . Years of education: 27  . Highest education level: High school graduate  Occupational History  . Occupation: Retired     Fish farm manager: UNEMPLOYED  Tobacco Use  . Smoking status: Current Every Day Smoker    Packs/day: 1.00    Years: 53.00    Pack years: 53.00    Types: Cigarettes  . Smokeless tobacco: Never Used  Substance and Sexual Activity  . Alcohol use: Yes    Comment:  occasional mixed drink  . Drug use: No  . Sexual activity: Not Currently    Birth control/protection: Surgical    Comment: Hysterectomy  Other Topics Concern  . Not on file  Social History Narrative   Widowed; has five children, 8 grandchildren and 4 great-grandchildren.   Social Determinants of Health   Financial Resource Strain:   . Difficulty of Paying Living Expenses:   Food Insecurity:   . Worried About Charity fundraiser in the Last Year:   . Arboriculturist in the Last Year:   Transportation Needs:   . Film/video editor (Medical):   [name] Lack of Transportation (Non-Medical):   Physical Activity:   . Days of Exercise per Week:   . Minutes  of Exercise per Session:   Stress:   . Feeling of Stress :   Social Connections:   . Frequency of Communication with Friends and Family:   . Frequency of Social Gatherings with Friends and Family:   . Attends Religious Services:   . Active Member of Clubs or Organizations:   . Attends Archivist Meetings:   [name] Marital Status:     Outpatient Encounter Medications as of 09/05/2019  Medication Sig  . albuterol (PROVENTIL) (2.5 MG/3ML) 0.083% nebulizer solution Take 3 mLs (2.5 mg total) by nebulization every 6 (six) hours as needed for wheezing or shortness of breath.  [name] albuterol (VENTOLIN HFA) 108 (90 Base) MCG/ACT inhaler INHALE 2 PUFFS BY MOUTH EVERY 4 HOURS AS NEEDED FOR WHEEZING OR SHORTNESS OF BREATH  . aspirin EC 81 MG tablet Take 81 mg by mouth daily.  . cyclobenzaprine (FLEXERIL) 10 MG tablet Take 1 tablet by mouth three times daily as needed for muscle spasm  . HYDROcodone-acetaminophen (NORCO/VICODIN) 5-325 MG tablet Take 1 tablet by mouth 2 (two) times daily as needed for moderate pain (pain).  . metoprolol tartrate (LOPRESSOR) 25 MG tablet Take 1 tablet by mouth twice daily  . rosuvastatin (CRESTOR) 10 MG tablet Take 1 tablet by mouth five times a week Monday-Friday  . senna (SENOKOT) 8.6 MG TABS tablet Take 1  tablet (8.6 mg total) by mouth daily as needed for mild constipation.  . fluticasone (FLONASE) 50 MCG/ACT nasal spray Place 2 sprays into both nostrils daily.   No facility-administered encounter medications on file as of 09/05/2019.    Activities of Daily Living In your present state of health, do you have any difficulty performing the following activities: 09/05/2019 06/16/2019  Hearing? N N  Vision? N N  Difficulty concentrating or making decisions? N N  Walking or climbing stairs? N N  Dressing or bathing? N N  Doing errands, shopping? N N  Preparing Food and eating ? N -  Using the Toilet? N -  In the past six months, have you accidently leaked urine? N -  Do you have problems with loss of bowel control? N -  Managing your Medications? N -  Managing your Finances? N -  Housekeeping or managing your Housekeeping? N -  Some recent data might be hidden    Patient Care Team: [name], MD as PCP - General (Internal Medicine) Belva Crome, MD as PCP - Cardiology (Cardiology) Gatha Mayer, MD as Consulting Physician (Gastroenterology) Paralee Cancel, MD as Consulting Physician (Orthopedic Surgery) Melina Schools, MD as Consulting Physician (Orthopedic Surgery) Kristeen Miss, MD as Consulting Physician (Neurosurgery) Belva Crome, MD as Consulting Physician (Cardiology) Lyndal Pulley, DO as Consulting Physician (Pain Medicine) Bo Merino, MD as Consulting Physician (Rheumatology) Tanda Rockers, MD as Consulting Physician (Pulmonary Disease)    Assessment:   This is a routine wellness examination for Erica [name].  Exercise Activities and Dietary recommendations Current Exercise Habits: Home exercise routine, Type of exercise: walking(works in the garden and yard work), Time (Minutes): 30, Frequency (Times/Week): 5, Weekly Exercise (Minutes/Week): 150, Intensity: Mild, Exercise limited by: neurologic condition(s);orthopedic condition(s);respiratory  conditions(s)  Goals    . Client understands the importance of follow-up with providers by attending scheduled visits (pt-stated)     Would like to cut back on smoking, but not ready to quit yet.    [name] less pain (pt-stated)     Wants to get back to her regular activity May consider eating more  regular meals; monitor diet;   Heart healthy diet;  Fat free or low fat dairy products Fish high in omega-3 acids ( salmon, tuna, trout) Fruits, such as apples, bananas, oranges, pears, prunes Legumes, such as kidney beans, lentils, checkpeas, black-eyed peas and lima bean Vegetables; broccoli, cabbage, carrots Whole grains;   Plant fats are better; decrease "white" foods as pasta, rice, bread and desserts, sugar; Avoid red meat (limiting) palm and coconut oils; sugary foods and beverages  Two nutrients that raise blood chol levels are saturated fats and trans fat; in hydrogenated oils and fats, as stick margarine, baked goods (cookes, cakes, pies, crackers; frosting; and coffee creamers;   Some Fats lower cholesterol: Monounsaturated and polyunsaturated  Avocados Corn, sunflower, and soybean oils Nuts and seeds, such as walnuts Olive, canola, peanut, safflower, and sesame oils Peanut butter Salmon and trout Tofu         Fall Risk Fall Risk  09/05/2019 06/16/2019 11/14/2017 11/03/2016 10/13/2015  Falls in the past year? 0 0 No No No  Number falls in past yr: 0 0 - - -  Injury with Fall? 0 0 - - -  Risk for fall due to : No Fall Risks - - - -  Follow up Falls evaluation completed;Education provided - - - -   Is the patient's home free of loose throw rugs in walkways, pet beds, electrical cords, etc?   yes      Grab bars in the bathroom? no      Handrails on the stairs?   yes      Adequate lighting?   yes  Timed Get Up and Go performed: not indicated  Depression Screen PHQ 2/9 Scores 09/05/2019 06/16/2019 05/28/2018 02/22/2018  PHQ - 2 Score 0 0 0 0  PHQ- 9 Score - - 4 -     Cognitive  Function: not indicated        Immunization History  Administered Date(s) Administered  . Fluad Quad(high Dose 65+) 12/05/2018  . Influenza Split 01/06/2009, 02/27/2011  . Influenza Whole 02/18/2010  . Influenza, High Dose Seasonal PF 02/20/2017, 02/08/2018  . Influenza,inj,Quad PF,6+ Mos 01/22/2013  . PFIZER SARS-COV-2 Vaccination 07/10/2019, 08/04/2019  . Pneumococcal Conjugate-13 03/22/2015  . Pneumococcal Polysaccharide-23 04/03/2008, 09/18/2016  . Td 04/03/2004  . Tdap 09/17/2014  . Zoster 11/18/2010    Qualifies for Shingles Vaccine? Yes  Screening Tests Health Maintenance  Topic Date Due  . Hepatitis C Screening  Never done  . MAMMOGRAM  02/07/2013  . INFLUENZA VACCINE  11/02/2019  . COLONOSCOPY  06/27/2022  . TETANUS/TDAP  09/16/2024  . DEXA SCAN  Completed  . COVID-19 Vaccine  Completed  . PNA vac Low Risk Adult  Completed    Cancer Screenings: Lung: Low Dose CT Chest recommended if Age 63-80 years, 30 pack-year currently smoking OR have quit w/in 15years. Patient does qualify. Breast:  Up to date on Mammogram? No   Up to date of Bone Density/Dexa? No Colorectal: Yes  Additional Screenings: Hepatitis C Screening: never done     Plan:     Reviewed health maintenance screenings with patient today and relevant education, vaccines, and/or referrals were provided.    Continue doing brain stimulating activities (puzzles, reading, adult coloring books, staying active) to keep memory sharp.    Continue to eat heart healthy diet (full of fruits, vegetables, whole grains, lean protein, water--limit salt, fat, and sugar intake) and increase physical activity as tolerated.  I have personally reviewed and noted the following in the  patient's chart:   . Medical and social history . Use of alcohol, tobacco or illicit drugs  . Current medications and supplements . Functional ability and status . Nutritional status . Physical activity . Advanced directives . List  of other physicians . Hospitalizations, surgeries, and ER visits in previous 12 months . Vitals . Screenings to include cognitive, depression, and falls . Referrals and appointments  In addition, I have reviewed and discussed with patient certain preventive protocols, quality metrics, and best practice recommendations. A written personalized care plan for preventive services as well as general preventive health recommendations were provided to patient.     Sheral Flow, LPN  09/08/2895  Nurse Health Advisor  Nurse Notes: There were no vitals filed for this visit. There is no height or weight on file to calculate BMI.

## 2019-09-18 ENCOUNTER — Ambulatory Visit (INDEPENDENT_AMBULATORY_CARE_PROVIDER_SITE_OTHER): Payer: Medicare Other | Admitting: Internal Medicine

## 2019-09-18 ENCOUNTER — Other Ambulatory Visit: Payer: Self-pay

## 2019-09-18 ENCOUNTER — Encounter: Payer: Self-pay | Admitting: Internal Medicine

## 2019-09-18 VITALS — BP 124/84 | HR 77 | Temp 98.0°F | Ht 61.0 in | Wt 117.0 lb

## 2019-09-18 DIAGNOSIS — J439 Emphysema, unspecified: Secondary | ICD-10-CM | POA: Diagnosis not present

## 2019-09-18 DIAGNOSIS — I2510 Atherosclerotic heart disease of native coronary artery without angina pectoris: Secondary | ICD-10-CM | POA: Diagnosis not present

## 2019-09-18 DIAGNOSIS — R634 Abnormal weight loss: Secondary | ICD-10-CM

## 2019-09-18 DIAGNOSIS — E538 Deficiency of other specified B group vitamins: Secondary | ICD-10-CM

## 2019-09-18 DIAGNOSIS — E559 Vitamin D deficiency, unspecified: Secondary | ICD-10-CM

## 2019-09-18 DIAGNOSIS — M542 Cervicalgia: Secondary | ICD-10-CM

## 2019-09-18 DIAGNOSIS — F1120 Opioid dependence, uncomplicated: Secondary | ICD-10-CM

## 2019-09-18 DIAGNOSIS — Z720 Tobacco use: Secondary | ICD-10-CM

## 2019-09-18 LAB — CBC
HCT: 40.5 % (ref 36.0–46.0)
Hemoglobin: 13.5 g/dL (ref 12.0–15.0)
MCHC: 33.4 g/dL (ref 30.0–36.0)
MCV: 92.5 fl (ref 78.0–100.0)
Platelets: 233 10*3/uL (ref 150.0–400.0)
RBC: 4.38 Mil/uL (ref 3.87–5.11)
RDW: 13.5 % (ref 11.5–15.5)
WBC: 6.4 10*3/uL (ref 4.0–10.5)

## 2019-09-18 LAB — COMPREHENSIVE METABOLIC PANEL
ALT: 19 U/L (ref 0–35)
AST: 24 U/L (ref 0–37)
Albumin: 4.3 g/dL (ref 3.5–5.2)
Alkaline Phosphatase: 63 U/L (ref 39–117)
BUN: 15 mg/dL (ref 6–23)
CO2: 30 mEq/L (ref 19–32)
Calcium: 9.8 mg/dL (ref 8.4–10.5)
Chloride: 103 mEq/L (ref 96–112)
Creatinine, Ser: 0.75 mg/dL (ref 0.40–1.20)
GFR: 76.5 mL/min (ref >60.00)
Glucose, Bld: 97 mg/dL (ref 70–99)
Potassium: 4.6 mEq/L (ref 3.5–5.1)
Sodium: 139 mEq/L (ref 135–145)
Total Bilirubin: 0.5 mg/dL (ref 0.2–1.2)
Total Protein: 6.7 g/dL (ref 6.0–8.3)

## 2019-09-18 LAB — TSH: TSH: 0.64 u[IU]/mL (ref 0.35–4.50)

## 2019-09-18 LAB — VITAMIN D 25 HYDROXY (VIT D DEFICIENCY, FRACTURES): VITD: 51.42 ng/mL (ref 30.00–100.00)

## 2019-09-18 LAB — T4, FREE: Free T4: 0.96 ng/dL (ref 0.60–1.60)

## 2019-09-18 LAB — VITAMIN B12: Vitamin B-12: 269 pg/mL (ref 211–911)

## 2019-09-18 MED ORDER — HYDROCODONE-ACETAMINOPHEN 5-325 MG PO TABS: 1.0000 | ORAL_TABLET | Freq: Two times a day (BID) | ORAL | 0 refills | Status: DC | PRN

## 2019-09-18 MED ORDER — FLUTICASONE PROPIONATE 50 MCG/ACT NA SUSP: 2.0000 | Freq: Every day | NASAL | 3 refills | Status: DC

## 2019-09-18 NOTE — Assessment & Plan Note (Signed)
Checking thyroid, vitamin D, B12, CBC, CMP to rule out metabolic causes. It is possible that her current oral intake is not adequate as her activity level is dramatically higher.

## 2019-09-18 NOTE — Patient Instructions (Signed)
We will check the labs today and have refilled the medicine.

## 2019-09-18 NOTE — Assessment & Plan Note (Signed)
Stable, refilled hydrocodone 5/325 mg BID. She does take this appropriately and [place] controlled database reviewed today without inappropriate fills.

## 2019-09-18 NOTE — Assessment & Plan Note (Signed)
Does not feel able to make an attempt to quit smoking. Stable SOB on albuterol prn only at this time.

## 2019-09-18 NOTE — Assessment & Plan Note (Signed)
Refilled hydrocodone 5/325 mg BID #60 no refills. [place] narcotic database reviewed and no inappropriate or early fills. Last UDS appropriate. Stable functioning with pain medication.

## 2019-09-18 NOTE — Assessment & Plan Note (Signed)
Does not want to make attempt to quit today.

## 2019-09-18 NOTE — Progress Notes (Signed)
   Subjective:   Patient ID: Erica [name], female    DOB: [birth year], 69 y.o.   MRN: [id number]  HPI The patient is a 69 YO female coming in for follow up of chronic pain (using hydrocodone BID, denies overuse or injury, denies worsening neck pain, does stay active and has been more active in garden, this pain does limit QOL and pain medication makes it manageable for her) and COPD (still smoking, no desire to attempt to quit currently, denies progression of SOB uses inhalers as needed, need refill flonase), and weight loss (does admit to being more active outdoors since weather improving, denies change in eating and denies skipping meals, is concerned as she had off thyroid labs in the past, denies heat or cold intolerance, some fatigue, denies sleeping problems, denies chest pains or change in SOB, recent CT lung cancer screening 2/21 without findings and recent colonoscopy normal as well).   Review of Systems  Constitutional: Positive for unexpected weight change.  HENT: Negative.   Eyes: Negative.   Respiratory: Positive for shortness of breath. Negative for cough and chest tightness.   Cardiovascular: Negative for chest pain, palpitations and leg swelling.  Gastrointestinal: Negative for abdominal distention, abdominal pain, constipation, diarrhea, nausea and vomiting.  Musculoskeletal: Negative.   Skin: Negative.   Neurological: Negative.   Psychiatric/Behavioral: Negative.     Objective:  Physical Exam Constitutional:      Appearance: She is well-developed.     Comments: thin  HENT:     Head: Normocephalic and atraumatic.  Cardiovascular:     Rate and Rhythm: Normal rate and regular rhythm.  Pulmonary:     Effort: Pulmonary effort is normal. No respiratory distress.     Breath sounds: Normal breath sounds. No wheezing or rales.  Abdominal:     General: Bowel sounds are normal. There is no distension.     Palpations: Abdomen is soft.     Tenderness: There is no abdominal  tenderness. There is no rebound.  Musculoskeletal:     Cervical back: Normal range of motion.  Skin:    General: Skin is warm and dry.  Neurological:     Mental Status: She is alert and oriented to person, place, and time.     Coordination: Coordination normal.     Vitals:   09/18/19 0751  BP: 124/84  Pulse: 77  Temp: 98 F (36.7 C)  TempSrc: Oral  SpO2: 95%  Weight: 117 lb (53.1 kg)  Height: 5' 1"  (1.549 m)    This visit occurred during the SARS-CoV-2 public health emergency.  Safety protocols were in place, including screening questions prior to the visit, additional usage of staff PPE, and extensive cleaning of exam room while observing appropriate contact time as indicated for disinfecting solutions.   Assessment & Plan:

## 2019-09-23 ENCOUNTER — Telehealth: Payer: Self-pay

## 2019-09-23 NOTE — Telephone Encounter (Signed)
New message    Calling for test results

## 2019-09-24 NOTE — Telephone Encounter (Signed)
Pt has been informed of results and expressed understanding.

## 2019-09-24 NOTE — Telephone Encounter (Signed)
Called pt, LVM.   

## 2019-10-09 DIAGNOSIS — F99 Mental disorder, not otherwise specified: Secondary | ICD-10-CM | POA: Diagnosis not present

## 2019-10-10 ENCOUNTER — Other Ambulatory Visit: Payer: Self-pay

## 2019-10-10 ENCOUNTER — Other Ambulatory Visit: Payer: Medicare Other | Admitting: *Deleted

## 2019-10-10 DIAGNOSIS — E782 Mixed hyperlipidemia: Secondary | ICD-10-CM

## 2019-10-10 DIAGNOSIS — I2510 Atherosclerotic heart disease of native coronary artery without angina pectoris: Secondary | ICD-10-CM

## 2019-10-10 LAB — HEPATIC FUNCTION PANEL
ALT: 13 IU/L (ref 0–32)
AST: 17 IU/L (ref 0–40)
Albumin: 4.3 g/dL (ref 3.8–4.8)
Alkaline Phosphatase: 73 IU/L (ref 48–121)
Bilirubin Total: 0.3 mg/dL (ref 0.0–1.2)
Bilirubin, Direct: 0.11 mg/dL (ref 0.00–0.40)
Total Protein: 6.2 g/dL (ref 6.0–8.5)

## 2019-10-10 LAB — LIPID PANEL
Chol/HDL Ratio: 2.4 ratio (ref 0.0–4.4)
Cholesterol, Total: 138 mg/dL (ref 100–199)
HDL: 58 mg/dL (ref >39)
LDL Chol Calc (NIH): 57 mg/dL (ref 0–99)
Triglycerides: 133 mg/dL (ref 0–149)
VLDL Cholesterol Cal: 23 mg/dL (ref 5–40)

## 2019-10-15 DIAGNOSIS — F99 Mental disorder, not otherwise specified: Secondary | ICD-10-CM | POA: Diagnosis not present

## 2019-10-17 ENCOUNTER — Telehealth: Payer: Self-pay | Admitting: Internal Medicine

## 2019-10-17 MED ORDER — HYDROCODONE-ACETAMINOPHEN 5-325 MG PO TABS: 1.0000 | ORAL_TABLET | Freq: Two times a day (BID) | ORAL | 0 refills | Status: DC | PRN

## 2019-10-17 NOTE — Telephone Encounter (Signed)
LVM informing pt the med they requested has been refilled at the pharmacy on file; instructions to return phone call if ques/concerns.

## 2019-10-17 NOTE — Telephone Encounter (Signed)
HYDROcodone-acetaminophen (NORCO/VICODIN) 5-325 MG tablet  [facility], [address] Phone:  [phone]  Fax:  [phone]     Last visit: 6.17.21  Next visit: 9.17.21

## 2019-10-17 NOTE — Telephone Encounter (Signed)
Last refill: 09/18/19 Will forward to Dr Sharlet Salina.

## 2019-10-17 NOTE — Addendum Note (Signed)
Addended by: Pricilla Holm A on: 10/17/2019 10:54 AM   Modules accepted: Orders

## 2019-10-27 ENCOUNTER — Ambulatory Visit: Payer: Self-pay

## 2019-10-27 ENCOUNTER — Other Ambulatory Visit: Payer: Self-pay

## 2019-10-27 ENCOUNTER — Ambulatory Visit (INDEPENDENT_AMBULATORY_CARE_PROVIDER_SITE_OTHER): Payer: Medicare Other | Admitting: Family Medicine

## 2019-10-27 VITALS — BP 124/82 | HR 85 | Ht 61.0 in | Wt 116.0 lb

## 2019-10-27 DIAGNOSIS — G8929 Other chronic pain: Secondary | ICD-10-CM | POA: Diagnosis not present

## 2019-10-27 DIAGNOSIS — I2510 Atherosclerotic heart disease of native coronary artery without angina pectoris: Secondary | ICD-10-CM

## 2019-10-27 DIAGNOSIS — M25511 Pain in right shoulder: Secondary | ICD-10-CM | POA: Diagnosis not present

## 2019-10-27 DIAGNOSIS — R634 Abnormal weight loss: Secondary | ICD-10-CM

## 2019-10-27 DIAGNOSIS — M65331 Trigger finger, right middle finger: Secondary | ICD-10-CM

## 2019-10-27 DIAGNOSIS — M542 Cervicalgia: Secondary | ICD-10-CM | POA: Diagnosis not present

## 2019-10-27 DIAGNOSIS — M12811 Other specific arthropathies, not elsewhere classified, right shoulder: Secondary | ICD-10-CM

## 2019-10-27 DIAGNOSIS — M1712 Unilateral primary osteoarthritis, left knee: Secondary | ICD-10-CM | POA: Diagnosis not present

## 2019-10-27 DIAGNOSIS — M5010 Cervical disc disorder with radiculopathy, unspecified cervical region: Secondary | ICD-10-CM | POA: Diagnosis not present

## 2019-10-27 NOTE — Progress Notes (Signed)
[place] Phone: [phone] Subjective:   Erica [name], am serving as a scribe for Dr. Hulan Saas. This visit occurred during the SARS-CoV-2 public health emergency.  Safety protocols were in place, including screening questions prior to the visit, additional usage of staff PPE, and extensive cleaning of exam room while observing appropriate contact time as indicated for disinfecting solutions.  I'm seeing this patient by the request  of:  [name], MD  CC: Multiple complaints  [id number]  Erica [name] is a 69 y.o. female coming in with complaint of right shoulder pain, right hand, and knee pain. Patient is having pain in 2nd and 3rd fingers over PIP joints. Fingers are swollen. Patient has been dropping items.   Also having right trap pain that radiates into shoulder and neck.  Has had this in the past and has responded well to cervical radiculopathies.  Also has done well with trigger point  Bilateral knee pain with gardening and yard work. Pain with deep knee bending. Right knee replacement. Left knee compensating.      Past Medical History:  Diagnosis Date  . Acute duodenitis 04/24/2017  . Allergy   . ANEMIA   . Anxiety   . BACK PAIN, LUMBAR   . Cancer of ascending colon pTispN0 s/p colectomy 03/05/2009 02/18/2010   Qualifier: Diagnosis of  By: Carlean Purl MD, Dimas Millin   . CHF (congestive heart failure) ([place])   . Complete rotator cuff tear of left shoulder 11/27/2013   Ultrasound guided injection on November 27, 2013   . COPD (chronic obstructive pulmonary disease) with emphysema ([place])   . COPD exacerbation ([place]) 11/14/2017  . Crohn's  02/2010   ileal ulcers, intol of entercort--refuses treatment  . Emphysema of lung ([place])   . GERD   . History of blood transfusion    w/ c/s surgery  . History of transfusion of whole blood   . HYPERLIPIDEMIA   . HYPERTENSION   . Microscopic  hematuria    chronic  . Myocardial infarction ([place]) 2010  . Obstruction of intestine or colon ([facility])    adhesions  . OSTEOARTHRITIS   . Rectal fissure    Possible fissure  . Rotator cuff tear   . Takotsubo syndrome 12/2008  . URINARY INCONTINENCE    Past Surgical History:  Procedure Laterality Date  . ABDOMINAL HYSTERECTOMY  1994  . APPENDECTOMY  1964  . BLADDER SUSPENSION    . CESAREAN SECTION     x 3  . CHOLECYSTECTOMY  1995  . COLONOSCOPY  2017  . COLONOSCOPY W/ BIOPSIES AND POLYPECTOMY  01/24/2011   (Crohn's)ileitis, internal hemorrhoids  . ECTOPIC PREGNANCY SURGERY    . ESOPHAGOGASTRODUODENOSCOPY    . FACIAL COSMETIC SURGERY    . HAND SURGERY Bilateral 1991   x 2  . KNEE SURGERY Right 1981  . LYSIS OF ADHESION  2010   ex lap/LOA for SBO Dr Excell Seltzer  . ORBITAL FRACTURE SURGERY  1990  . RIGHT COLECTOMY  03/2009   Right colectomy for colon CA.  Dr Donne Hazel  . TONSILLECTOMY  1952  . TOTAL KNEE ARTHROPLASTY  03/2010   Dr Alvan Dame.  Depuy  . UPPER GASTROINTESTINAL ENDOSCOPY  05/16/2010   normal  . UTERINE SUSPENSION    . VIDEO BRONCHOSCOPY Bilateral 12/18/2014   Procedure: VIDEO BRONCHOSCOPY WITHOUT FLUORO;  Surgeon: Tanda Rockers, MD;  Location: [facility];  Service: Cardiopulmonary;  Laterality: Bilateral;  Social History   Socioeconomic History  . Marital status: Widowed    Spouse name: Not on file  . Number of children: 5  . Years of education: 62  . Highest education level: High school graduate  Occupational History  . Occupation: Retired     Fish farm manager: UNEMPLOYED  Tobacco Use  . Smoking status: Current Every Day Smoker    Packs/day: 1.00    Years: 53.00    Pack years: 53.00    Types: Cigarettes  . Smokeless tobacco: Never Used  Vaping Use  . Vaping Use: Never used  Substance and Sexual Activity  . Alcohol use: Yes    Comment: occasional mixed drink  . Drug use: [name]  . Sexual activity: Not Currently    Birth control/protection: Surgical    Comment:  Hysterectomy  Other Topics Concern  . Not on file  Social History Narrative   Widowed; has five children, 8 grandchildren and 4 great-grandchildren.   Social Determinants of Health   Financial Resource Strain:   . Difficulty of Paying Living Expenses:   Food Insecurity:   . Worried About Charity fundraiser in the Last Year:   . Arboriculturist in the Last Year:   Transportation Needs:   . Film/video editor (Medical):   [name] Lack of Transportation (Non-Medical):   Physical Activity:   . Days of Exercise per Week:   . Minutes of Exercise per Session:   Stress:   . Feeling of Stress :   Social Connections:   . Frequency of Communication with Friends and Family:   . Frequency of Social Gatherings with Friends and Family:   . Attends Religious Services:   . Active Member of Clubs or Organizations:   . Attends Archivist Meetings:   [name] Marital Status:    Allergies  Allergen Reactions  . Morphine Nausea And Vomiting   Family History  Problem Relation Age of Onset  . Colon cancer Father 40  . Hypertension Father   . Heart disease Father   . Kidney disease Father   . Colon cancer Sister 38  . Liver cancer Sister   . Other Sister        amyloidosis  . Throat cancer Mother   . Arthritis Other        Parent, other relative  . Stomach cancer Neg Hx   . Rectal cancer Neg Hx      Current Outpatient Medications (Cardiovascular):  .  metoprolol tartrate (LOPRESSOR) 25 MG tablet, Take 1 tablet by mouth twice daily .  rosuvastatin (CRESTOR) 10 MG tablet, Take 1 tablet by mouth five times a week Monday-Friday  Current Outpatient Medications (Respiratory):  .  albuterol (PROVENTIL) (2.5 MG/3ML) 0.083% nebulizer solution, Take 3 mLs (2.5 mg total) by nebulization every 6 (six) hours as needed for wheezing or shortness of breath. [name]  albuterol (VENTOLIN HFA) 108 (90 Base) MCG/ACT inhaler, INHALE 2 PUFFS BY MOUTH EVERY 4 HOURS AS NEEDED FOR WHEEZING OR SHORTNESS OF  BREATH .  fluticasone (FLONASE) 50 MCG/ACT nasal spray, Place 2 sprays into both nostrils daily.  Current Outpatient Medications (Analgesics):  .  aspirin EC 81 MG tablet, Take 81 mg by mouth daily. [name]  HYDROcodone-acetaminophen (NORCO/VICODIN) 5-325 MG tablet, Take 1 tablet by mouth 2 (two) times daily as needed for moderate pain (pain).   Current Outpatient Medications (Other):  .  cyclobenzaprine (FLEXERIL) 10 MG tablet, Take 1 tablet by mouth three times daily as needed for muscle  spasm .  senna (SENOKOT) 8.6 MG TABS tablet, Take 1 tablet (8.6 mg total) by mouth daily as needed for mild constipation.   Reviewed prior external information including notes and imaging from  primary care provider As well as notes that were available from care everywhere and other healthcare systems.  Past medical history, social, surgical and family history all reviewed in electronic medical record.  [name] pertanent information unless stated regarding to the chief complaint.   Review of Systems:  [name] headache, visual changes, nausea, vomiting, diarrhea, constipation, dizziness, abdominal pain, skin rash, fevers, chills, night sweats, weight loss, swollen lymph nodes, body aches, joint swelling, chest pain, shortness of breath, mood changes. POSITIVE muscle aches  Objective  Blood pressure 124/82, pulse 85, height 5' 1"  (1.549 m), weight 116 lb (52.6 kg), SpO2 95 %.   General: [name] apparent distress alert and oriented x3 mood and affect normal, dressed appropriately.  Appears significantly cachectic continue with weight loss Patient's right shoulder positive impingement noted.  More pain noted with Spurling's of the neck.  Loss of lordosis.  Radicular symptoms in the C7 distribution. Right hand does have trigger nodules noted to the flexor tendon sheath A2 of the second and third fingers seems to be worse on the third finger. Knee: Left valgus deformity noted.  Abnormal thigh to calf ratio.  Tender to  palpation over medial and PF joint line.  ROM mild loss of flexion and extension instability with valgus force.  painful patellar compression. Patellar glide with moderate crepitus. Patellar and quadriceps tendons unremarkable. Hamstring and quadriceps strength is normal. Contralateral knee shows replacement noted in good alignment  After informed written and verbal consent, patient was seated on exam table. Left knee was prepped with alcohol swab and utilizing anterolateral approach, patient's left knee space was injected with 4:1  marcaine 0.5%: Kenalog 29m/dL. Patient tolerated the procedure well without immediate complications.  Procedure: Real-time Ultrasound Guided Injection of right sided third flexor tendon sheath Device: GE Logiq Q7 Ultrasound guided injection is preferred based studies that show increased duration, increased effect, greater accuracy, decreased procedural pain, increased response rate, and decreased cost with ultrasound guided versus blind injection.  Verbal informed consent obtained.  Time-out conducted.  Noted [name] overlying erythema, induration, or other signs of local infection.  Skin prepped in a sterile fashion.  Local anesthesia: Topical Ethyl chloride.  With sterile technique and under real time ultrasound guidance: With a 25-gauge half inch needle injected with 0.5 cc of 0.5% Marcaine and 0.5 cc of Kenalog 40 mg/mL Completed without difficulty  Pain immediately resolved suggesting accurate placement of the medication.  Advised to call if fevers/chills, erythema, induration, drainage, or persistent bleeding.  Images permanently stored and available for review in the ultrasound unit.  Impression: Technically successful ultrasound guided injection.    Impression and Recommendations:     The above documentation has been reviewed and is accurate and complete ZLyndal Pulley DO       Note: This dictation was prepared with Dragon dictation along with  smaller phrase technology. Any transcriptional errors that result from this process are unintentional.

## 2019-10-27 NOTE — Patient Instructions (Signed)
[facility] for epidural [phone] See me in 6 weeks

## 2019-10-28 ENCOUNTER — Encounter: Payer: Self-pay | Admitting: Family Medicine

## 2019-10-28 DIAGNOSIS — M65331 Trigger finger, right middle finger: Secondary | ICD-10-CM | POA: Insufficient documentation

## 2019-10-28 NOTE — Assessment & Plan Note (Signed)
Injection given today.  Could be a candidate for viscosupplementation.  Has responded well to injections previously.  Follow-up again in 4 to 8 weeks

## 2019-10-28 NOTE — Assessment & Plan Note (Signed)
Worsening symptoms.  Chronic problem.  Discussed the Flexeril, home exercise, repeat epidural, follow-up 4 weeks after injection patient has had some weight loss again.  Encourage patient to talk to primary care provider.

## 2019-10-28 NOTE — Assessment & Plan Note (Addendum)
Chronic problem with exacerbation.  Likely more secondary to cervical radiculopathy and see how patient responds to the epidural first if no improvement consider other injections

## 2019-10-28 NOTE — Assessment & Plan Note (Signed)
Encourage follow-up with primary care provider with history of cancer

## 2019-10-28 NOTE — Assessment & Plan Note (Signed)
Patient responded well to the injection.  Discussed icing regimen and home exercises.  Discussed bracing at night.  Follow-up again in 4 to 8 weeks

## 2019-10-29 DIAGNOSIS — F99 Mental disorder, not otherwise specified: Secondary | ICD-10-CM | POA: Diagnosis not present

## 2019-11-07 ENCOUNTER — Other Ambulatory Visit: Payer: Medicare Other

## 2019-11-12 DIAGNOSIS — F99 Mental disorder, not otherwise specified: Secondary | ICD-10-CM | POA: Diagnosis not present

## 2019-11-17 ENCOUNTER — Telehealth: Payer: Self-pay | Admitting: Internal Medicine

## 2019-11-17 ENCOUNTER — Other Ambulatory Visit: Payer: Self-pay | Admitting: Internal Medicine

## 2019-11-17 NOTE — Telephone Encounter (Signed)
10/17/2019 Hydrocodone-Acetamin 5-325 Mg 60#  Last ov 09/18/19 Next ov 02/18/20

## 2019-11-17 NOTE — Telephone Encounter (Signed)
    1.Medication Requested:HYDROcodone-acetaminophen (NORCO/VICODIN) 5-325 MG tablet  2. Pharmacy (Name, Street, [place]): [facility] [address]  3. On Med List: yes  4. Last Visit with PCP: 09/18/19  5. Next visit date with PCP:   Agent: Please be advised that RX refills may take up to 3 business days. We ask that you follow-up with your pharmacy.

## 2019-11-18 NOTE — Telephone Encounter (Signed)
New Message:   Pt is calling to check on the status of her medication refill. Please advise.

## 2019-11-19 DIAGNOSIS — F99 Mental disorder, not otherwise specified: Secondary | ICD-10-CM | POA: Diagnosis not present

## 2019-11-19 MED ORDER — HYDROCODONE-ACETAMINOPHEN 5-325 MG PO TABS: 1.0000 | ORAL_TABLET | Freq: Two times a day (BID) | ORAL | 0 refills | Status: DC | PRN

## 2019-11-19 NOTE — Telephone Encounter (Signed)
lvm for pt informing of same

## 2019-11-19 NOTE — Addendum Note (Signed)
Addended by: Biagio Borg on: 11/19/2019 12:57 PM   Modules accepted: Orders

## 2019-11-19 NOTE — Telephone Encounter (Signed)
Done erx 

## 2019-11-21 ENCOUNTER — Ambulatory Visit
Admission: RE | Admit: 2019-11-21 | Discharge: 2019-11-21 | Disposition: A | Discharge status: Home / Self Care | Payer: Medicare Other | Source: Ambulatory Visit | Attending: Family Medicine | Admitting: Family Medicine

## 2019-11-21 ENCOUNTER — Other Ambulatory Visit: Payer: Self-pay

## 2019-11-21 DIAGNOSIS — M5412 Radiculopathy, cervical region: Secondary | ICD-10-CM | POA: Diagnosis not present

## 2019-11-21 DIAGNOSIS — M542 Cervicalgia: Secondary | ICD-10-CM

## 2019-11-21 MED ORDER — TRIAMCINOLONE ACETONIDE 40 MG/ML IJ SUSP (RADIOLOGY)
60.0000 mg | Freq: Once | INTRAMUSCULAR | Status: AC
  Administered 2019-11-21: 60 mg via EPIDURAL

## 2019-11-21 MED ORDER — IOPAMIDOL (ISOVUE-M 300) INJECTION 61%
1.0000 mL | Freq: Once | INTRAMUSCULAR | Status: AC | PRN
  Administered 2019-11-21: 1 mL via EPIDURAL

## 2019-11-21 NOTE — Discharge Instructions (Signed)

## 2019-11-26 DIAGNOSIS — F99 Mental disorder, not otherwise specified: Secondary | ICD-10-CM | POA: Diagnosis not present

## 2019-11-27 ENCOUNTER — Telehealth: Payer: Self-pay | Admitting: Family Medicine

## 2019-11-27 NOTE — Telephone Encounter (Signed)
Pt called, she knows that Dr. [name] is concerned is concerned with her weight loss. She thinks she could use some help with her choices and eating habits and would like to be referred to a nutritionist.

## 2019-11-28 ENCOUNTER — Other Ambulatory Visit: Payer: Self-pay

## 2019-11-28 DIAGNOSIS — R634 Abnormal weight loss: Secondary | ICD-10-CM

## 2019-11-28 NOTE — Telephone Encounter (Signed)
Order placed for nutrition and patient notified.

## 2019-12-08 NOTE — Progress Notes (Signed)
[facility] [address] [place] Phone: [phone] Subjective:   I Erica [name] am serving as a Education administrator for Dr. Hulan Saas.  This visit occurred during the SARS-CoV-2 public health emergency.  Safety protocols were in place, including screening questions prior to the visit, additional usage of staff PPE, and extensive cleaning of exam room while observing appropriate contact time as indicated for disinfecting solutions.   I'm seeing this patient by the request  of:  [name], MD  CC: Multiple complaint follow-up  GUR:[id number]   10/27/2019 Right shoulder - Chronic problem with exacerbation.  Likely more secondary to cervical radiculopathy and see how patient responds to the epidural first if no improvement consider other injections  Trigger finger - Patient responded well to the injection.  Discussed icing regimen and home exercises.  Discussed bracing at night.  Follow-up again in 4 to 8 weeks  Osteoarthrosis lower leg- Injection given today.  Could be a candidate for viscosupplementation.  Has responded well to injections previously.  Follow-up again in 4 to 8 weeks   Weight loss - Encourage follow-up with primary care provider with history of cancer  12/09/2019 Erica [name] is a 69 y.o. female coming in with complaint of right shoulder pain. Patient states she is doing. Back is not doing well. Patient states she is still in pain. Patient states she is wearing the splint at night for the trigger finger. Right ring finger giving her some issues.      cervical epidural done 11/21/19  Due for chest CT scan 2/22  Past Medical History:  Diagnosis Date  . Acute duodenitis 04/24/2017  . Allergy   . ANEMIA   . Anxiety   . BACK PAIN, LUMBAR   . Cancer of ascending colon pTispN0 s/p colectomy 03/05/2009 02/18/2010   Qualifier: Diagnosis of  By: Carlean Purl MD, Dimas Millin   . CHF (congestive heart failure) ([place])   .  Complete rotator cuff tear of left shoulder 11/27/2013   Ultrasound guided injection on November 27, 2013   . COPD (chronic obstructive pulmonary disease) with emphysema ([place])   . COPD exacerbation ([place]) 11/14/2017  . Crohn's  02/2010   ileal ulcers, intol of entercort--refuses treatment  . Emphysema of lung ([place])   . GERD   . History of blood transfusion    w/ c/s surgery  . History of transfusion of whole blood   . HYPERLIPIDEMIA   . HYPERTENSION   . Microscopic hematuria    chronic  . Myocardial infarction ([place]) 2010  . Obstruction of intestine or colon ([facility])    adhesions  . OSTEOARTHRITIS   . Rectal fissure    Possible fissure  . Rotator cuff tear   . Takotsubo syndrome 12/2008  . URINARY INCONTINENCE    Past Surgical History:  Procedure Laterality Date  . ABDOMINAL HYSTERECTOMY  1994  . APPENDECTOMY  1964  . BLADDER SUSPENSION    . CESAREAN SECTION     x 3  . CHOLECYSTECTOMY  1995  . COLONOSCOPY  2017  . COLONOSCOPY W/ BIOPSIES AND POLYPECTOMY  01/24/2011   (Crohn's)ileitis, internal hemorrhoids  . ECTOPIC PREGNANCY SURGERY    . ESOPHAGOGASTRODUODENOSCOPY    . FACIAL COSMETIC SURGERY    . HAND SURGERY Bilateral 1991   x 2  . KNEE SURGERY Right 1981  . LYSIS OF ADHESION  2010   ex lap/LOA for SBO Dr Excell Seltzer  . ORBITAL FRACTURE SURGERY  1990  .  RIGHT COLECTOMY  03/2009   Right colectomy for colon CA.  Dr Donne Hazel  . TONSILLECTOMY  1952  . TOTAL KNEE ARTHROPLASTY  03/2010   Dr Alvan Dame.  Depuy  . UPPER GASTROINTESTINAL ENDOSCOPY  05/16/2010   normal  . UTERINE SUSPENSION    . VIDEO BRONCHOSCOPY Bilateral 12/18/2014   Procedure: VIDEO BRONCHOSCOPY WITHOUT FLUORO;  Surgeon: Tanda Rockers, MD;  Location: [facility];  Service: Cardiopulmonary;  Laterality: Bilateral;   Social History   Socioeconomic History  . Marital status: Widowed    Spouse name: Not on file  . Number of children: 5  . Years of education: 42  . Highest education level: High school graduate    Occupational History  . Occupation: Retired     Fish farm manager: UNEMPLOYED  Tobacco Use  . Smoking status: Current Every Day Smoker    Packs/day: 1.00    Years: 53.00    Pack years: 53.00    Types: Cigarettes  . Smokeless tobacco: Never Used  Vaping Use  . Vaping Use: Never used  Substance and Sexual Activity  . Alcohol use: Yes    Comment: occasional mixed drink  . Drug use: No  . Sexual activity: Not Currently    Birth control/protection: Surgical    Comment: Hysterectomy  Other Topics Concern  . Not on file  Social History Narrative   Widowed; has five children, 8 grandchildren and 4 great-grandchildren.   Social Determinants of Health   Financial Resource Strain:   . Difficulty of Paying Living Expenses: Not on file  Food Insecurity:   . Worried About Charity fundraiser in the Last Year: Not on file  . Ran Out of Food in the Last Year: Not on file  Transportation Needs:   . Lack of Transportation (Medical): Not on file  . Lack of Transportation (Non-Medical): Not on file  Physical Activity:   . Days of Exercise per Week: Not on file  . Minutes of Exercise per Session: Not on file  Stress:   . Feeling of Stress : Not on file  Social Connections:   . Frequency of Communication with Friends and Family: Not on file  . Frequency of Social Gatherings with Friends and Family: Not on file  . Attends Religious Services: Not on file  . Active Member of Clubs or Organizations: Not on file  . Attends Archivist Meetings: Not on file  . Marital Status: Not on file   Allergies  Allergen Reactions  . Morphine Nausea And Vomiting   Family History  Problem Relation Age of Onset  . Colon cancer Father 47  . Hypertension Father   . Heart disease Father   . Kidney disease Father   . Colon cancer Sister 10  . Liver cancer Sister   . Other Sister        amyloidosis  . Throat cancer Mother   . Arthritis Other        Parent, other relative  . Stomach cancer Neg Hx    . Rectal cancer Neg Hx      Current Outpatient Medications (Cardiovascular):  .  metoprolol tartrate (LOPRESSOR) 25 MG tablet, Take 1 tablet by mouth twice daily .  rosuvastatin (CRESTOR) 10 MG tablet, Take 1 tablet by mouth five times a week Monday-Friday  Current Outpatient Medications (Respiratory):  .  albuterol (PROVENTIL) (2.5 MG/3ML) 0.083% nebulizer solution, Take 3 mLs (2.5 mg total) by nebulization every 6 (six) hours as needed for wheezing or shortness of breath. [name]  albuterol (VENTOLIN HFA) 108 (90 Base) MCG/ACT inhaler, INHALE 2 PUFFS BY MOUTH EVERY 4 HOURS AS NEEDED FOR WHEEZING OR SHORTNESS OF BREATH .  fluticasone (FLONASE) 50 MCG/ACT nasal spray, Place 2 sprays into both nostrils daily.  Current Outpatient Medications (Analgesics):  .  aspirin EC 81 MG tablet, Take 81 mg by mouth daily. [name]  HYDROcodone-acetaminophen (NORCO/VICODIN) 5-325 MG tablet, Take 1 tablet by mouth 2 (two) times daily as needed for moderate pain (pain).   Current Outpatient Medications (Other):  .  cyclobenzaprine (FLEXERIL) 10 MG tablet, Take 1 tablet by mouth three times daily as needed for muscle spasm .  senna (SENOKOT) 8.6 MG TABS tablet, Take 1 tablet (8.6 mg total) by mouth daily as needed for mild constipation.   Reviewed prior external information including notes and imaging from  primary care provider As well as notes that were available from care everywhere and other healthcare systems.  Past medical history, social, surgical and family history all reviewed in electronic medical record.  No pertanent information unless stated regarding to the chief complaint.   Review of Systems:  No headache, visual changes, nausea, vomiting, diarrhea, constipation, dizziness,  skin rash, fevers, chills, night sweats, weight loss, swollen lymph nodes,  joint swelling, chest pain, shortness of breath, mood changes. POSITIVE muscle aches, body aches, abdominal pain  Objective  Blood pressure 140/80,  pulse 71, height 5' 1"  (1.549 m), weight 119 lb (54 kg), SpO2 94 %.   General: No apparent distress alert and oriented x3 mood and affect normal, dressed appropriately.  HEENT: Pupils equal, extraocular movements intact  Respiratory: Patient's speak in full sentences and does not appear short of breath  Cardiovascular: No lower extremity edema, non tender, no erythema  Neuro: Cranial nerves II through XII are intact, Gait normal with good balance and coordination.  MSK: Significant arthritic changes of multiple joints  Right ring finger does have a trigger nodule noted at the A2 pulley.  Tender to palpation in this area.  Low back exam significant tightness noted.  Patient does have some loss of lordosis with some degenerative scoliosis, tightness with FABER test.  4-5 strength of lower extremities.   Procedure: Real-time Ultrasound Guided Injection of right flexor tendon sheath of the fourth finger Device: GE Logiq Q7 Ultrasound guided injection is preferred based studies that show increased duration, increased effect, greater accuracy, decreased procedural pain, increased response rate, and decreased cost with ultrasound guided versus blind injection.  Verbal informed consent obtained.  Time-out conducted.  Noted no overlying erythema, induration, or other signs of local infection.  Skin prepped in a sterile fashion.  Local anesthesia: Topical Ethyl chloride.  With sterile technique and under real time ultrasound guidance: With a 25-gauge half inch needle injected with 0.5 cc of 0.5% Marcaine and 0.5 cc of Kenalog 40 mg/mL Completed without difficulty  Pain immediately resolved suggesting accurate placement of the medication.  Advised to call if fevers/chills, erythema, induration, drainage, or persistent bleeding. .  Impression: Technically successful ultrasound guided injection. Impression and Recommendations:     The above documentation has been reviewed and is accurate and  complete Lyndal Pulley, DO       Note: This dictation was prepared with Dragon dictation along with smaller phrase technology. Any transcriptional errors that result from this process are unintentional.

## 2019-12-09 ENCOUNTER — Encounter: Payer: Self-pay | Admitting: Family Medicine

## 2019-12-09 ENCOUNTER — Ambulatory Visit (INDEPENDENT_AMBULATORY_CARE_PROVIDER_SITE_OTHER): Payer: Medicare Other | Admitting: Family Medicine

## 2019-12-09 ENCOUNTER — Other Ambulatory Visit: Payer: Self-pay

## 2019-12-09 ENCOUNTER — Ambulatory Visit: Payer: Self-pay

## 2019-12-09 VITALS — BP 140/80 | HR 71 | Ht 61.0 in | Wt 119.0 lb

## 2019-12-09 DIAGNOSIS — M545 Low back pain, unspecified: Secondary | ICD-10-CM

## 2019-12-09 DIAGNOSIS — M5416 Radiculopathy, lumbar region: Secondary | ICD-10-CM | POA: Diagnosis not present

## 2019-12-09 DIAGNOSIS — I2510 Atherosclerotic heart disease of native coronary artery without angina pectoris: Secondary | ICD-10-CM

## 2019-12-09 DIAGNOSIS — M65341 Trigger finger, right ring finger: Secondary | ICD-10-CM

## 2019-12-09 DIAGNOSIS — G8929 Other chronic pain: Secondary | ICD-10-CM

## 2019-12-09 DIAGNOSIS — M79641 Pain in right hand: Secondary | ICD-10-CM

## 2019-12-09 NOTE — Assessment & Plan Note (Signed)
Injection given today, tolerated the procedure well, discussed icing regimen and home exercises, patient does have the edition social determinants of health including patient's history of rheumatoid arthritis or positive anti-CCP.  Patient is to wear bracing at night, can repeat injections every 3 to 6 months if necessary.  Follow-up again 4 to 8 weeks

## 2019-12-09 NOTE — Patient Instructions (Addendum)
Good to see you Injected ring finger today Epidural for L4-L5 Call [facility] to schedule [phone] Brace at night See me again in 3 months

## 2019-12-09 NOTE — Assessment & Plan Note (Signed)
Patient has had significant tightness recently of the lower back and more of the radicular symptoms it appears.  Patient did respond very well to epidural back in L 2019.  Patient will give injection at this time.  History of Crohn's disease and will need to continue to be monitored otherwise that could be referred pain.  Follow-up again 4 weeks after the epidural.

## 2019-12-10 DIAGNOSIS — F99 Mental disorder, not otherwise specified: Secondary | ICD-10-CM | POA: Diagnosis not present

## 2019-12-15 ENCOUNTER — Telehealth: Payer: Self-pay | Admitting: Internal Medicine

## 2019-12-15 MED ORDER — HYDROCODONE-ACETAMINOPHEN 5-325 MG PO TABS: 1.0000 | ORAL_TABLET | Freq: Two times a day (BID) | ORAL | 0 refills | Status: DC | PRN

## 2019-12-15 NOTE — Telephone Encounter (Signed)
Dr. Jenny Reichmann refilled on 8/18; able to refill on 9/17; she is 4 days too soon.

## 2019-12-15 NOTE — Telephone Encounter (Signed)
Noted.  Pt has been informed.

## 2019-12-15 NOTE — Addendum Note (Signed)
Addended by: Sherlene Shams on: 12/15/2019 01:52 PM   Modules accepted: Orders

## 2019-12-15 NOTE — Telephone Encounter (Signed)
    1.Medication Requested:HYDROcodone-acetaminophen (NORCO/VICODIN) 5-325 MG tablet  2. Pharmacy (Name, Street, [place]):[place] [place] [address]  3. On Med List: yes  4. Last Visit with PCP: 06/17  5. Next visit date with PCP: 11/17   Agent: Please be advised that RX refills may take up to 3 business days. We ask that you follow-up with your pharmacy.

## 2019-12-15 NOTE — Progress Notes (Signed)
°  Chronic Care Management   Outreach Note  12/15/2019 Name: Erica [name] MRN: [id number] DOB: 1951/01/06  Referred by: [name], MD Reason for referral : No chief complaint on file.   An unsuccessful telephone outreach was attempted today. The patient was referred to the pharmacist for assistance with care management and care coordination.   Follow Up Plan:   Carley Perdue [employer] Scheduler

## 2019-12-17 DIAGNOSIS — F99 Mental disorder, not otherwise specified: Secondary | ICD-10-CM | POA: Diagnosis not present

## 2019-12-19 ENCOUNTER — Ambulatory Visit: Payer: Medicare Other | Admitting: Internal Medicine

## 2019-12-22 NOTE — Progress Notes (Signed)
Virtual Visit via Video Note  I connected with Erica [name] on 12/22/19 at  1:45 PM EDT by a video enabled telemedicine application and verified that I am speaking with the correct person using two identifiers.   I discussed the limitations of evaluation and management by telemedicine and the availability of in person appointments. The patient expressed understanding and agreed to proceed.  Present for the visit:  Myself, Dr Billey Gosling, Erica [name].  The patient is currently at home and I am in the office.    No referring provider.    History of Present Illness: She is here for an acute visit for cold symptoms.   Her symptoms started several days ago.  Two days ago her inhalers were not working as well.    She is experiencing cold symptoms that are typical of her COPD exacerbation and bronchitis.  She denies any possible Covid exposure.  T  She has tried taking flonase, neb treatment/inhalers and allergy medications  She states subjective fever, nasal congestion, sinus pressure, rhinorrhea, productive cough, shortness of breath and wheezing.   Review of Systems  Constitutional: Positive for fever (subjective).  HENT: Positive for congestion and sinus pain. Negative for sore throat.        Rhinorrhea  Respiratory: Positive for cough, sputum production, shortness of breath and wheezing.   Neurological: Negative for headaches.      Social History   Socioeconomic History  . Marital status: Widowed    Spouse name: Not on file  . Number of children: 5  . Years of education: 16  . Highest education level: High school graduate  Occupational History  . Occupation: Retired     Fish farm manager: UNEMPLOYED  Tobacco Use  . Smoking status: Current Every Day Smoker    Packs/day: 1.00    Years: 53.00    Pack years: 53.00    Types: Cigarettes  . Smokeless tobacco: Never Used  Vaping Use  . Vaping Use: Never used  Substance and Sexual Activity  . Alcohol use: Yes    Comment:  occasional mixed drink  . Drug use: No  . Sexual activity: Not Currently    Birth control/protection: Surgical    Comment: Hysterectomy  Other Topics Concern  . Not on file  Social History Narrative   Widowed; has five children, 8 grandchildren and 4 great-grandchildren.   Social Determinants of Health   Financial Resource Strain:   . Difficulty of Paying Living Expenses: Not on file  Food Insecurity:   . Worried About Charity fundraiser in the Last Year: Not on file  . Ran Out of Food in the Last Year: Not on file  Transportation Needs:   . Lack of Transportation (Medical): Not on file  . Lack of Transportation (Non-Medical): Not on file  Physical Activity:   . Days of Exercise per Week: Not on file  . Minutes of Exercise per Session: Not on file  Stress:   . Feeling of Stress : Not on file  Social Connections:   . Frequency of Communication with Friends and Family: Not on file  . Frequency of Social Gatherings with Friends and Family: Not on file  . Attends Religious Services: Not on file  . Active Member of Clubs or Organizations: Not on file  . Attends Archivist Meetings: Not on file  . Marital Status: Not on file     Observations/Objective: Appears well in NAD Breathing normally, intermittent cough Skin appears warm and dry  Assessment and  Plan:  See Problem List for Assessment and Plan of chronic medical problems.   Follow Up Instructions:    I discussed the assessment and treatment plan with the patient. The patient was provided an opportunity to ask questions and all were answered. The patient agreed with the plan and demonstrated an understanding of the instructions.   The patient was advised to call back or seek an in-person evaluation if the symptoms worsen or if the condition fails to improve as anticipated.    Binnie Rail, MD

## 2019-12-23 ENCOUNTER — Encounter: Payer: Self-pay | Admitting: Internal Medicine

## 2019-12-23 ENCOUNTER — Other Ambulatory Visit: Payer: Self-pay | Admitting: Internal Medicine

## 2019-12-23 ENCOUNTER — Telehealth (INDEPENDENT_AMBULATORY_CARE_PROVIDER_SITE_OTHER): Payer: Medicare Other | Admitting: Internal Medicine

## 2019-12-23 DIAGNOSIS — J40 Bronchitis, not specified as acute or chronic: Secondary | ICD-10-CM

## 2019-12-23 DIAGNOSIS — J441 Chronic obstructive pulmonary disease with (acute) exacerbation: Secondary | ICD-10-CM

## 2019-12-23 DIAGNOSIS — I2510 Atherosclerotic heart disease of native coronary artery without angina pectoris: Secondary | ICD-10-CM

## 2019-12-23 MED ORDER — DOXYCYCLINE HYCLATE 100 MG PO TABS: 100.0000 mg | ORAL_TABLET | Freq: Two times a day (BID) | ORAL | 0 refills | Status: DC

## 2019-12-23 MED ORDER — PREDNISONE 20 MG PO TABS: 40.0000 mg | ORAL_TABLET | Freq: Every day | ORAL | 0 refills | Status: DC

## 2019-12-23 NOTE — Assessment & Plan Note (Signed)
Acute Likely bacterial With associated COPD exacerbation Continue neb treatments and inhalers Start doxycycline and prednisone taper Continue over-the-counter cold and allergy medications She will call if there is no improvement

## 2019-12-23 NOTE — Assessment & Plan Note (Signed)
Acute COPD exacerbation related to acute bronchitis Continue neb treatments and inhalers Start doxycycline and prednisone taper Continue over-the-counter cold and allergy medications She will call if there is no improvement

## 2019-12-24 DIAGNOSIS — F99 Mental disorder, not otherwise specified: Secondary | ICD-10-CM | POA: Diagnosis not present

## 2019-12-26 ENCOUNTER — Other Ambulatory Visit: Payer: Medicare Other

## 2019-12-29 ENCOUNTER — Telehealth: Payer: Self-pay | Admitting: Internal Medicine

## 2019-12-29 NOTE — Progress Notes (Signed)
  Chronic Care Management   Outreach Note  12/29/2019 Name: Kieana Livesay MRN: [id number] DOB: Jul 17, 1950  Referred by: [name], MD Reason for referral : No chief complaint on file.   A second unsuccessful telephone outreach was attempted today. The patient was referred to pharmacist for assistance with care management and care coordination.  Follow Up Plan:   Carley Perdue [employer] Scheduler

## 2019-12-30 ENCOUNTER — Telehealth: Payer: Self-pay | Admitting: Internal Medicine

## 2019-12-30 NOTE — Progress Notes (Signed)
  Chronic Care Management   Note  12/30/2019 Name: Rhylie Stehr MRN: [id number] DOB: 1950/08/02  Ciela Mahajan is a 69 y.o. year old female who is a primary care patient of [name], MD. I reached out to [employer] by phone today in response to a referral sent by Ms. Sidda Pries's PCP, [name], MD.   Ms. Demario was given information about Chronic Care Management services today including:  1. CCM service includes personalized support from designated clinical staff supervised by her physician, including individualized plan of care and coordination with other care providers 2. 24/7 contact phone numbers for assistance for urgent and routine care needs. 3. Service will only be billed when office clinical staff spend 20 minutes or more in a month to coordinate care. 4. Only one practitioner may furnish and bill the service in a calendar month. 5. The patient may stop CCM services at any time (effective at the end of the month) by phone call to the office staff.   Patient agreed to services and verbal consent obtained.   Follow up plan:   Carley Perdue [employer] Scheduler

## 2019-12-31 DIAGNOSIS — F99 Mental disorder, not otherwise specified: Secondary | ICD-10-CM | POA: Diagnosis not present

## 2020-01-07 ENCOUNTER — Telehealth: Payer: Self-pay | Admitting: Internal Medicine

## 2020-01-07 DIAGNOSIS — F99 Mental disorder, not otherwise specified: Secondary | ICD-10-CM | POA: Diagnosis not present

## 2020-01-07 NOTE — Telephone Encounter (Signed)
Patient states she is 60% better but still having wheezing and congestion  Does she need to do another dose of meds?

## 2020-01-08 NOTE — Telephone Encounter (Signed)
Patient needs to be seen. Appointment left for her to contact office for appointment.

## 2020-01-08 NOTE — Telephone Encounter (Signed)
She should be re-evaluated

## 2020-01-10 ENCOUNTER — Other Ambulatory Visit: Payer: Self-pay | Admitting: Physician Assistant

## 2020-01-13 ENCOUNTER — Other Ambulatory Visit: Payer: Self-pay

## 2020-01-13 MED ORDER — ROSUVASTATIN CALCIUM 10 MG PO TABS: ORAL_TABLET | ORAL | 6 refills | Status: DC

## 2020-01-13 NOTE — Progress Notes (Signed)
Virtual Visit via Video Note  I connected with Erica [name] on 01/13/20 at  9:00 AM EDT by a video enabled telemedicine application and verified that I am speaking with the correct person using two identifiers.   I discussed the limitations of evaluation and management by telemedicine and the availability of in person appointments. The patient expressed understanding and agreed to proceed.  Present for the visit:  Myself, Dr Billey Gosling, Erica [name].  The patient is currently at home and I am in the office.    No referring provider.    History of Present Illness: This is an acute visit for persistent cough. I saw her 9/21 and dx her with a COPD exacerbation and bronchitis.    She completed the doxycycline and prednisone I prescribed on 9/21 and is using her inhalers, neb treatments, flonase and allergy medication.  The medication helped initially.  Then she started having an increase in SOB and was using her nebs/inhalers more.  She did a home covid test and it was negative.   She denies fever/chills   Review of Systems  Constitutional: Negative for chills and fever.  HENT: Positive for congestion and sinus pain. Negative for sore throat.   Respiratory: Positive for cough, sputum production (a little yellow at times), shortness of breath and wheezing.   Neurological: Negative for headaches.      Social History   Socioeconomic History  . Marital status: Widowed    Spouse name: Not on file  . Number of children: 5  . Years of education: 62  . Highest education level: High school graduate  Occupational History  . Occupation: Retired     Fish farm manager: UNEMPLOYED  Tobacco Use  . Smoking status: Current Every Day Smoker    Packs/day: 1.00    Years: 53.00    Pack years: 53.00    Types: Cigarettes  . Smokeless tobacco: Never Used  Vaping Use  . Vaping Use: Never used  Substance and Sexual Activity  . Alcohol use: Yes    Comment: occasional mixed drink  . Drug use: No   . Sexual activity: Not Currently    Birth control/protection: Surgical    Comment: Hysterectomy  Other Topics Concern  . Not on file  Social History Narrative   Widowed; has five children, 8 grandchildren and 4 great-grandchildren.   Social Determinants of Health   Financial Resource Strain:   . Difficulty of Paying Living Expenses: Not on file  Food Insecurity:   . Worried About Charity fundraiser in the Last Year: Not on file  . Ran Out of Food in the Last Year: Not on file  Transportation Needs:   . Lack of Transportation (Medical): Not on file  . Lack of Transportation (Non-Medical): Not on file  Physical Activity:   . Days of Exercise per Week: Not on file  . Minutes of Exercise per Session: Not on file  Stress:   . Feeling of Stress : Not on file  Social Connections:   . Frequency of Communication with Friends and Family: Not on file  . Frequency of Social Gatherings with Friends and Family: Not on file  . Attends Religious Services: Not on file  . Active Member of Clubs or Organizations: Not on file  . Attends Archivist Meetings: Not on file  . Marital Status: Not on file     Observations/Objective: Appears well in NAD Breathing normally, with coughing intermittently Skin appears and dry  Assessment and Plan:  See  Problem List for Assessment and Plan of chronic medical problems.   Follow Up Instructions:    I discussed the assessment and treatment plan with the patient. The patient was provided an opportunity to ask questions and all were answered. The patient agreed with the plan and demonstrated an understanding of the instructions.   The patient was advised to call back or seek an in-person evaluation if the symptoms worsen or if the condition fails to improve as anticipated.    Binnie Rail, MD

## 2020-01-14 ENCOUNTER — Encounter: Payer: Self-pay | Admitting: Internal Medicine

## 2020-01-14 ENCOUNTER — Telehealth (INDEPENDENT_AMBULATORY_CARE_PROVIDER_SITE_OTHER): Payer: Medicare Other | Admitting: Internal Medicine

## 2020-01-14 DIAGNOSIS — J441 Chronic obstructive pulmonary disease with (acute) exacerbation: Secondary | ICD-10-CM

## 2020-01-14 DIAGNOSIS — J40 Bronchitis, not specified as acute or chronic: Secondary | ICD-10-CM | POA: Diagnosis not present

## 2020-01-14 DIAGNOSIS — I2510 Atherosclerotic heart disease of native coronary artery without angina pectoris: Secondary | ICD-10-CM | POA: Diagnosis not present

## 2020-01-14 DIAGNOSIS — F99 Mental disorder, not otherwise specified: Secondary | ICD-10-CM | POA: Diagnosis not present

## 2020-01-14 MED ORDER — LEVOFLOXACIN 500 MG PO TABS
500.0000 mg | ORAL_TABLET | Freq: Every day | ORAL | 0 refills | Status: DC
Start: 2020-01-14 — End: 2020-02-18

## 2020-01-14 MED ORDER — PREDNISONE 10 MG PO TABS: ORAL_TABLET | ORAL | 0 refills | Status: DC

## 2020-01-14 NOTE — Assessment & Plan Note (Signed)
Subacute Still with COPD exacerbation and possible residual bronchitis Start levaquin 500 mg daily x 7 days Prednisone taper 40 mg x 3 days then taper Continue nebs, inhalers, and allergy medications Call if no improvement

## 2020-01-14 NOTE — Assessment & Plan Note (Signed)
Subacute Still with COPD exacerbation and possible residual bronchitis Start levaquin 500 mg daily x 7 days Prednisone taper 40 mg x 3 days then taper Continue nebs, inhalers, and allergy medications

## 2020-01-15 ENCOUNTER — Telehealth: Payer: Self-pay | Admitting: Internal Medicine

## 2020-01-15 MED ORDER — HYDROCODONE-ACETAMINOPHEN 5-325 MG PO TABS
1.0000 | ORAL_TABLET | Freq: Two times a day (BID) | ORAL | 0 refills | Status: DC | PRN
Start: 2020-01-15 — End: 2020-02-18

## 2020-01-15 NOTE — Telephone Encounter (Signed)
Done erx 

## 2020-01-15 NOTE — Addendum Note (Signed)
Addended by: Biagio Borg on: 01/15/2020 01:08 PM   Modules accepted: Orders

## 2020-01-15 NOTE — Telephone Encounter (Signed)
    Patient requesting refill for HYDROcodone-acetaminophen (NORCO/VICODIN) 5-325 MG tablet Pharmacy:[facility] [place], [place]

## 2020-01-19 ENCOUNTER — Other Ambulatory Visit: Payer: Medicare Other

## 2020-01-20 ENCOUNTER — Other Ambulatory Visit: Payer: Self-pay | Admitting: Internal Medicine

## 2020-01-21 DIAGNOSIS — F99 Mental disorder, not otherwise specified: Secondary | ICD-10-CM | POA: Diagnosis not present

## 2020-01-23 ENCOUNTER — Telehealth: Payer: Self-pay | Admitting: Internal Medicine

## 2020-01-23 ENCOUNTER — Ambulatory Visit (INDEPENDENT_AMBULATORY_CARE_PROVIDER_SITE_OTHER): Payer: Medicare Other

## 2020-01-23 DIAGNOSIS — R0602 Shortness of breath: Secondary | ICD-10-CM

## 2020-01-23 NOTE — Telephone Encounter (Signed)
Patient is still having some issues with sinuses and shortness of breath, she states she would just like a chest xray to make sure she was fine. Patient was seen on 01/14/20 by Dr. Quay Burow for same issue. Patient states this happens every year and she always gets pneumonia around this time of year.

## 2020-01-23 NOTE — Telephone Encounter (Signed)
Pt has been informed cxr was ordered.

## 2020-01-23 NOTE — Addendum Note (Signed)
Addended by: Binnie Rail on: 01/23/2020 09:05 AM   Modules accepted: Orders

## 2020-01-23 NOTE — Telephone Encounter (Signed)
cxr ordered.   If symptoms continue she needs an appt

## 2020-01-26 ENCOUNTER — Telehealth: Payer: Self-pay | Admitting: Internal Medicine

## 2020-01-26 NOTE — Telephone Encounter (Signed)
    Patient calling to discuss imaging results

## 2020-01-26 NOTE — Telephone Encounter (Signed)
Pt notified that chest xray shows no acute infection; mild airway thickening which is chronic and likely from COPD.  Pt verb understanding & has no further questions.

## 2020-01-28 DIAGNOSIS — F99 Mental disorder, not otherwise specified: Secondary | ICD-10-CM | POA: Diagnosis not present

## 2020-02-02 ENCOUNTER — Other Ambulatory Visit: Payer: Self-pay

## 2020-02-02 ENCOUNTER — Ambulatory Visit
Admission: RE | Admit: 2020-02-02 | Discharge: 2020-02-02 | Disposition: A | Discharge status: Home / Self Care | Payer: Medicare Other | Source: Ambulatory Visit | Attending: Family Medicine | Admitting: Family Medicine

## 2020-02-02 DIAGNOSIS — M5416 Radiculopathy, lumbar region: Secondary | ICD-10-CM | POA: Diagnosis not present

## 2020-02-02 DIAGNOSIS — M545 Low back pain, unspecified: Secondary | ICD-10-CM

## 2020-02-02 DIAGNOSIS — G8929 Other chronic pain: Secondary | ICD-10-CM

## 2020-02-02 MED ORDER — METHYLPREDNISOLONE ACETATE 40 MG/ML INJ SUSP (RADIOLOG
120.0000 mg | Freq: Once | INTRAMUSCULAR | Status: AC
  Administered 2020-02-02: 120 mg via EPIDURAL

## 2020-02-02 MED ORDER — IOPAMIDOL (ISOVUE-M 200) INJECTION 41%
1.0000 mL | Freq: Once | INTRAMUSCULAR | Status: AC
  Administered 2020-02-02: 1 mL via EPIDURAL

## 2020-02-02 NOTE — Discharge Instructions (Signed)

## 2020-02-04 DIAGNOSIS — F99 Mental disorder, not otherwise specified: Secondary | ICD-10-CM | POA: Diagnosis not present

## 2020-02-15 ENCOUNTER — Other Ambulatory Visit: Payer: Self-pay | Admitting: Interventional Cardiology

## 2020-02-16 ENCOUNTER — Telehealth: Payer: Self-pay | Admitting: Internal Medicine

## 2020-02-16 NOTE — Telephone Encounter (Signed)
HYDROcodone-acetaminophen (NORCO/VICODIN) 5-325 MG tablet [place], [address] Phone:  [phone]  Fax:  [phone]     Requesting a refill

## 2020-02-18 ENCOUNTER — Other Ambulatory Visit: Payer: Self-pay

## 2020-02-18 ENCOUNTER — Encounter: Payer: Self-pay | Admitting: Internal Medicine

## 2020-02-18 ENCOUNTER — Ambulatory Visit (INDEPENDENT_AMBULATORY_CARE_PROVIDER_SITE_OTHER): Payer: Medicare Other | Admitting: Internal Medicine

## 2020-02-18 VITALS — BP 138/78 | HR 86 | Temp 98.9°F | Ht 61.0 in | Wt 117.0 lb

## 2020-02-18 DIAGNOSIS — F1120 Opioid dependence, uncomplicated: Secondary | ICD-10-CM

## 2020-02-18 DIAGNOSIS — R7301 Impaired fasting glucose: Secondary | ICD-10-CM

## 2020-02-18 DIAGNOSIS — I10 Essential (primary) hypertension: Secondary | ICD-10-CM

## 2020-02-18 DIAGNOSIS — I2510 Atherosclerotic heart disease of native coronary artery without angina pectoris: Secondary | ICD-10-CM

## 2020-02-18 LAB — COMPREHENSIVE METABOLIC PANEL
ALT: 17 U/L (ref 0–35)
AST: 19 U/L (ref 0–37)
Albumin: 4.1 g/dL (ref 3.5–5.2)
Alkaline Phosphatase: 55 U/L (ref 39–117)
BUN: 13 mg/dL (ref 6–23)
CO2: 34 mEq/L — ABNORMAL HIGH (ref 19–32)
Calcium: 9.4 mg/dL (ref 8.4–10.5)
Chloride: 103 mEq/L (ref 96–112)
Creatinine, Ser: 0.71 mg/dL (ref 0.40–1.20)
GFR: 86.45 mL/min (ref >60.00)
Glucose, Bld: 85 mg/dL (ref 70–99)
Potassium: 4.2 mEq/L (ref 3.5–5.1)
Sodium: 141 mEq/L (ref 135–145)
Total Bilirubin: 0.4 mg/dL (ref 0.2–1.2)
Total Protein: 6.7 g/dL (ref 6.0–8.3)

## 2020-02-18 LAB — URINALYSIS, ROUTINE W REFLEX MICROSCOPIC
Bilirubin Urine: NEGATIVE
Ketones, ur: NEGATIVE
Leukocytes,Ua: NEGATIVE
Nitrite: NEGATIVE
Specific Gravity, Urine: 1.02 (ref 1.000–1.030)
Total Protein, Urine: NEGATIVE
Urine Glucose: NEGATIVE
Urobilinogen, UA: 0.2 (ref 0.0–1.0)
pH: 6 (ref 5.0–8.0)

## 2020-02-18 LAB — CBC
HCT: 42.8 % (ref 36.0–46.0)
Hemoglobin: 14.2 g/dL (ref 12.0–15.0)
MCHC: 33.2 g/dL (ref 30.0–36.0)
MCV: 93.5 fl (ref 78.0–100.0)
Platelets: 276 10*3/uL (ref 150.0–400.0)
RBC: 4.58 Mil/uL (ref 3.87–5.11)
RDW: 13.5 % (ref 11.5–15.5)
WBC: 9.6 10*3/uL (ref 4.0–10.5)

## 2020-02-18 LAB — HEMOGLOBIN A1C: Hgb A1c MFr Bld: 5.9 % (ref 4.6–6.5)

## 2020-02-18 MED ORDER — HYDROCODONE-ACETAMINOPHEN 5-325 MG PO TABS: 1.0000 | ORAL_TABLET | Freq: Two times a day (BID) | ORAL | 0 refills | Status: DC | PRN

## 2020-02-18 NOTE — Patient Instructions (Signed)
We will check out the labs and urine today and figure out what is going on.

## 2020-02-18 NOTE — Progress Notes (Signed)
   Subjective:   Patient ID: Erica [name], female    DOB: [birth year], 69 y.o.   MRN: [id number]  HPI The patient is a 69 YO female coming in for concerns about treatment while this provider was out on medical leave. She does take hydrocodone and gets #60 per month. The first time she tried to get this filled it was several days late and she feels if she had not called multiple times it would not have gotten filled. She then called another month and it was filled right away which was early and then the pharmacy tried to fill it for her and she was concerned about being accused of filling her meds early and getting in trouble so she had them cancel the fill and then fill on the correct date. She does have back pain which is her pain. This is about usual. She denies overuse of medications.   Review of Systems  Constitutional: Negative.   HENT: Negative.   Eyes: Negative.   Respiratory: Positive for shortness of breath. Negative for cough and chest tightness.   Cardiovascular: Negative for chest pain, palpitations and leg swelling.  Gastrointestinal: Negative for abdominal distention, abdominal pain, constipation, diarrhea, nausea and vomiting.  Musculoskeletal: Positive for arthralgias and back pain.  Skin: Negative.   Neurological: Negative.   Psychiatric/Behavioral: Negative.     Objective:  Physical Exam Constitutional:      Appearance: She is well-developed.  HENT:     Head: Normocephalic and atraumatic.  Cardiovascular:     Rate and Rhythm: Normal rate and regular rhythm.  Pulmonary:     Effort: Pulmonary effort is normal. No respiratory distress.     Breath sounds: Normal breath sounds. No wheezing or rales.     Comments: At baseline Abdominal:     General: Bowel sounds are normal. There is no distension.     Palpations: Abdomen is soft.     Tenderness: There is no abdominal tenderness. There is no rebound.  Musculoskeletal:        General: Tenderness present.      Cervical back: Normal range of motion.  Skin:    General: Skin is warm and dry.  Neurological:     Mental Status: She is alert and oriented to person, place, and time.     Coordination: Coordination normal.     Vitals:   02/18/20 0819  BP: 138/78  Pulse: 86  Temp: 98.9 F (37.2 C)  TempSrc: Oral  SpO2: 96%  Weight: 117 lb (53.1 kg)  Height: 5' 1"  (1.549 m)    This visit occurred during the SARS-CoV-2 public health emergency.  Safety protocols were in place, including screening questions prior to the visit, additional usage of staff PPE, and extensive cleaning of exam room while observing appropriate contact time as indicated for disinfecting solutions.   Assessment & Plan:  Visit time 30 minutes in face to face communication with patient and coordination of care, additional 5 minutes spent in record review, coordination or care, ordering tests, communicating/referring to other healthcare professionals, documenting in medical records all on the same day of the visit for total time 35 minutes spent on the visit.

## 2020-02-20 NOTE — Assessment & Plan Note (Signed)
Refilled hydrocodone #60 no refills and apologized for delay in refill. [place] narcotic database without inappropriate fills. Last UDS appropriate.

## 2020-02-20 NOTE — Assessment & Plan Note (Signed)
BP at goal, checking labs and adjust regimen as needed.

## 2020-02-25 ENCOUNTER — Ambulatory Visit: Payer: Medicare Other

## 2020-02-25 DIAGNOSIS — F99 Mental disorder, not otherwise specified: Secondary | ICD-10-CM | POA: Diagnosis not present

## 2020-03-01 ENCOUNTER — Other Ambulatory Visit: Payer: Self-pay | Admitting: Internal Medicine

## 2020-03-03 DIAGNOSIS — F99 Mental disorder, not otherwise specified: Secondary | ICD-10-CM | POA: Diagnosis not present

## 2020-03-08 NOTE — Progress Notes (Signed)
[place] [address] [place] Phone: [phone] Subjective:   I Erica [name] am serving as a Education administrator for Dr. Hulan Saas.  This visit occurred during the SARS-CoV-2 public health emergency.  Safety protocols were in place, including screening questions prior to the visit, additional usage of staff PPE, and extensive cleaning of exam room while observing appropriate contact time as indicated for disinfecting solutions.   I'm seeing this patient by the request  of:  [name], MD  CC: Back pain follow-up  [id number]   12/09/2019 Patient has had significant tightness recently of the lower back and more of the radicular symptoms it appears.  Patient did respond very well to epidural back in L 2019.  Patient will give injection at this time.  History of Crohn's disease and will need to continue to be monitored otherwise that could be referred pain.  Follow-up again 4 weeks after the epidural.  Injection given today, tolerated the procedure well, discussed icing regimen and home exercises, patient does have the edition social determinants of health including patient's history of rheumatoid arthritis or positive anti-CCP.  Patient is to wear bracing at night, can repeat injections every 3 to 6 months if necessary.  Follow-up again 4 to 8 weeks  Update 03/09/2020 Erica [name] is a 69 y.o. female coming in with complaint of right hand trigger finger and lower back pain. Had epidural on 02/02/2020. Patient states she has no relief after the epidural. Believes she may be worse. Hands are getting worse. States she can't open her hands sometimes. Taking 2 opioids a day. States that taking her opioid meds did not help this time. States she is has no relief. Pain takes her breath away.  Patient feels that the epidural just do not work as well as it has done in the past.  Has been sometime since she has had this.  Patient did not seem primary  care provider and did have laboratory work-up showing the patient did have some hemoglobin in her urine but patient states that she has had that long-term.  Has had a history of kidney stones which she states.  Patient has been recently sick with COPD exacerbations as well and delayed to the epidural.  Patient has had some stress recently as well with family members having Covid and being hospitalized  Wt Readings from Last 3 Encounters:  03/09/20 118 lb (53.5 kg)  02/18/20 117 lb (53.1 kg)  12/09/19 119 lb (54 kg)        Past Medical History:  Diagnosis Date  . Acute duodenitis 04/24/2017  . Allergy   . ANEMIA   . Anxiety   . BACK PAIN, LUMBAR   . Cancer of ascending colon pTispN0 s/p colectomy 03/05/2009 02/18/2010   Qualifier: Diagnosis of  By: Carlean Purl MD, Dimas Millin   . CHF (congestive heart failure) ([place])   . Complete rotator cuff tear of left shoulder 11/27/2013   Ultrasound guided injection on November 27, 2013   . COPD (chronic obstructive pulmonary disease) with emphysema ([place])   . COPD exacerbation ([place]) 11/14/2017  . Crohn's  02/2010   ileal ulcers, intol of entercort--refuses treatment  . Emphysema of lung ([place])   . GERD   . History of blood transfusion    w/ c/s surgery  . History of transfusion of whole blood   . HYPERLIPIDEMIA   . HYPERTENSION   . Microscopic hematuria    chronic  . Myocardial  infarction ([place]) 2010  . Obstruction of intestine or colon (H[facility])    adhesions  . OSTEOARTHRITIS   . Rectal fissure    Possible fissure  . Rotator cuff tear   . Takotsubo syndrome 12/2008  . URINARY INCONTINENCE    Past Surgical History:  Procedure Laterality Date  . ABDOMINAL HYSTERECTOMY  1994  . APPENDECTOMY  1964  . BLADDER SUSPENSION    . CESAREAN SECTION     x 3  . CHOLECYSTECTOMY  1995  . COLONOSCOPY  2017  . COLONOSCOPY W/ BIOPSIES AND POLYPECTOMY  01/24/2011   (Crohn's)ileitis, internal hemorrhoids  . ECTOPIC PREGNANCY SURGERY    .  ESOPHAGOGASTRODUODENOSCOPY    . FACIAL COSMETIC SURGERY    . HAND SURGERY Bilateral 1991   x 2  . KNEE SURGERY Right 1981  . LYSIS OF ADHESION  2010   ex lap/LOA for SBO Dr Excell Seltzer  . ORBITAL FRACTURE SURGERY  1990  . RIGHT COLECTOMY  03/2009   Right colectomy for colon CA.  Dr Donne Hazel  . TONSILLECTOMY  1952  . TOTAL KNEE ARTHROPLASTY  03/2010   Dr Alvan Dame.  Depuy  . UPPER GASTROINTESTINAL ENDOSCOPY  05/16/2010   normal  . UTERINE SUSPENSION    . VIDEO BRONCHOSCOPY Bilateral 12/18/2014   Procedure: VIDEO BRONCHOSCOPY WITHOUT FLUORO;  Surgeon: Tanda Rockers, MD;  Location: [facility];  Service: Cardiopulmonary;  Laterality: Bilateral;   Social History   Socioeconomic History  . Marital status: Widowed    Spouse name: Not on file  . Number of children: 5  . Years of education: 68  . Highest education level: High school graduate  Occupational History  . Occupation: Retired     Fish farm manager: UNEMPLOYED  Tobacco Use  . Smoking status: Current Every Day Smoker    Packs/day: 1.00    Years: 53.00    Pack years: 53.00    Types: Cigarettes  . Smokeless tobacco: Never Used  Vaping Use  . Vaping Use: Never used  Substance and Sexual Activity  . Alcohol use: Yes    Comment: occasional mixed drink  . Drug use: No  . Sexual activity: Not Currently    Birth control/protection: Surgical    Comment: Hysterectomy  Other Topics Concern  . Not on file  Social History Narrative   Widowed; has five children, 8 grandchildren and 4 great-grandchildren.   Social Determinants of Health   Financial Resource Strain:   . Difficulty of Paying Living Expenses: Not on file  Food Insecurity:   . Worried About Charity fundraiser in the Last Year: Not on file  . Ran Out of Food in the Last Year: Not on file  Transportation Needs:   . Lack of Transportation (Medical): Not on file  . Lack of Transportation (Non-Medical): Not on file  Physical Activity:   . Days of Exercise per Week: Not on file   . Minutes of Exercise per Session: Not on file  Stress:   . Feeling of Stress : Not on file  Social Connections:   . Frequency of Communication with Friends and Family: Not on file  . Frequency of Social Gatherings with Friends and Family: Not on file  . Attends Religious Services: Not on file  . Active Member of Clubs or Organizations: Not on file  . Attends Archivist Meetings: Not on file  . Marital Status: Not on file   Allergies  Allergen Reactions  . Morphine Nausea And Vomiting   Family History  Problem Relation Age of Onset  . Colon cancer Father 63  . Hypertension Father   . Heart disease Father   . Kidney disease Father   . Colon cancer Sister 62  . Liver cancer Sister   . Other Sister        amyloidosis  . Throat cancer Mother   . Arthritis Other        Parent, other relative  . Stomach cancer Neg Hx   . Rectal cancer Neg Hx      Current Outpatient Medications (Cardiovascular):  .  metoprolol tartrate (LOPRESSOR) 25 MG tablet, Take 1 tablet by mouth twice daily .  rosuvastatin (CRESTOR) 10 MG tablet, Take 1 tablet by mouth five times a week Monday-Friday  Current Outpatient Medications (Respiratory):  .  albuterol (PROVENTIL) (2.5 MG/3ML) 0.083% nebulizer solution, Take 3 mLs (2.5 mg total) by nebulization every 6 (six) hours as needed for wheezing or shortness of breath. [name]  albuterol (VENTOLIN HFA) 108 (90 Base) MCG/ACT inhaler, INHALE 2 PUFFS BY MOUTH EVERY 4 HOURS AS NEEDED FOR WHEEZING FOR SHORTNESS OF BREATH .  fluticasone (FLONASE) 50 MCG/ACT nasal spray, Place 2 sprays into both nostrils daily.  Current Outpatient Medications (Analgesics):  .  aspirin EC 81 MG tablet, Take 81 mg by mouth daily. [name]  HYDROcodone-acetaminophen (NORCO/VICODIN) 5-325 MG tablet, Take 1 tablet by mouth 2 (two) times daily as needed for moderate pain (pain).   Current Outpatient Medications (Other):  .  cyclobenzaprine (FLEXERIL) 10 MG tablet, Take 1 tablet by  mouth three times daily as needed for muscle spasm   Reviewed prior external information including notes and imaging from  primary care provider As well as notes that were available from care everywhere and other healthcare systems.  Past medical history, social, surgical and family history all reviewed in electronic medical record.  No pertanent information unless stated regarding to the chief complaint.   Review of Systems:  No headache, visual changes, nausea, vomiting, diarrhea, constipation, dizziness, abdominal pain, skin rash, fevers, chills, night sweats, weight loss, swollen lymph nodes, joint swelling, chest pain, shortness of breath, mood changes. POSITIVE muscle aches, body aches and fatigue  Objective  Blood pressure 130/84, pulse 74, height 5' 1"  (1.549 m), weight 118 lb (53.5 kg), SpO2 93 %.   General: No apparent distress alert and oriented x3 mood and affect normal, dressed appropriately.  HEENT: Pupils equal, extraocular movements intact  Respiratory: Patient's speak in full sentences.  Mild shortness of breath but seems to be patient's baseline Cardiovascular: No lower extremity edema, non tender, no erythema  Severe arthritic changes of multiple joints Patient back exam does have severe tenderness diffusely of the lumbar spine.  Mild positive straight leg test still noted on the left greater than the right.  Patient has 4+ out of 5 strength in lower extremities.  Atrophy of the lower extremities bilaterally.   Impression and Recommendations:     The above documentation has been reviewed and is accurate and complete Lyndal Pulley, DO

## 2020-03-09 ENCOUNTER — Ambulatory Visit (INDEPENDENT_AMBULATORY_CARE_PROVIDER_SITE_OTHER): Payer: Medicare Other

## 2020-03-09 ENCOUNTER — Encounter: Payer: Self-pay | Admitting: Family Medicine

## 2020-03-09 ENCOUNTER — Other Ambulatory Visit: Payer: Self-pay

## 2020-03-09 ENCOUNTER — Ambulatory Visit (INDEPENDENT_AMBULATORY_CARE_PROVIDER_SITE_OTHER): Payer: Medicare Other | Admitting: Family Medicine

## 2020-03-09 VITALS — BP 130/84 | HR 74 | Ht 61.0 in | Wt 118.0 lb

## 2020-03-09 DIAGNOSIS — I2510 Atherosclerotic heart disease of native coronary artery without angina pectoris: Secondary | ICD-10-CM

## 2020-03-09 DIAGNOSIS — M5416 Radiculopathy, lumbar region: Secondary | ICD-10-CM

## 2020-03-09 DIAGNOSIS — G8929 Other chronic pain: Secondary | ICD-10-CM | POA: Diagnosis not present

## 2020-03-09 DIAGNOSIS — M545 Low back pain, unspecified: Secondary | ICD-10-CM

## 2020-03-09 DIAGNOSIS — R109 Unspecified abdominal pain: Secondary | ICD-10-CM | POA: Diagnosis not present

## 2020-03-09 NOTE — Patient Instructions (Addendum)
Good to see you Xray today Epidural in back If not better we need labs and likely CT abdomen  See me again in 4 weeks after epidural

## 2020-03-09 NOTE — Assessment & Plan Note (Signed)
Chronic back pain with worsening symptoms.  We discussed again at this length.  Patient would like to try another epidural.  We discussed different medications patient is on the cyclobenzaprine at this moment.  Patient is getting narcotics from primary care provider.  We discussed Cymbalta I think being a highly good possibility of patient wants to try other medications.  Patient will consider it but did not want to start it today patient would like to try the epidural and see how patient responds.  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  Patient wants to hold at this time and see how the epidural does.  Patient knows that if any worsening symptoms she needs to seek medical attention immediately.  Total time with patient today 37 minutes.

## 2020-03-10 DIAGNOSIS — F70 Mild intellectual disabilities: Secondary | ICD-10-CM | POA: Diagnosis not present

## 2020-03-10 DIAGNOSIS — F99 Mental disorder, not otherwise specified: Secondary | ICD-10-CM | POA: Diagnosis not present

## 2020-03-13 DIAGNOSIS — Z23 Encounter for immunization: Secondary | ICD-10-CM | POA: Diagnosis not present

## 2020-03-17 ENCOUNTER — Other Ambulatory Visit: Payer: Self-pay | Admitting: Internal Medicine

## 2020-03-17 DIAGNOSIS — F99 Mental disorder, not otherwise specified: Secondary | ICD-10-CM | POA: Diagnosis not present

## 2020-03-17 NOTE — Telephone Encounter (Signed)
    Patient requesting refill for HYDROcodone-acetaminophen (NORCO/VICODIN) 5-325 MG tablet [place] [place]

## 2020-03-18 MED ORDER — HYDROCODONE-ACETAMINOPHEN 5-325 MG PO TABS: 1.0000 | ORAL_TABLET | Freq: Two times a day (BID) | ORAL | 0 refills | Status: DC | PRN

## 2020-03-18 NOTE — Telephone Encounter (Signed)
[place] Controlled Database Checked Last filled:  02/18/20 # 60 LOV w/you:  02/18/20  Next appt w/you:   05/20/20

## 2020-03-19 ENCOUNTER — Other Ambulatory Visit: Payer: Self-pay

## 2020-03-19 ENCOUNTER — Ambulatory Visit
Admission: RE | Admit: 2020-03-19 | Discharge: 2020-03-19 | Disposition: A | Discharge status: Home / Self Care | Payer: Medicare Other | Source: Ambulatory Visit | Attending: Family Medicine | Admitting: Family Medicine

## 2020-03-19 DIAGNOSIS — M5126 Other intervertebral disc displacement, lumbar region: Secondary | ICD-10-CM | POA: Diagnosis not present

## 2020-03-19 DIAGNOSIS — G8929 Other chronic pain: Secondary | ICD-10-CM

## 2020-03-19 DIAGNOSIS — M545 Low back pain, unspecified: Secondary | ICD-10-CM

## 2020-03-19 MED ORDER — IOPAMIDOL (ISOVUE-M 200) INJECTION 41%
1.0000 mL | Freq: Once | INTRAMUSCULAR | Status: AC
  Administered 2020-03-19: 1 mL via EPIDURAL

## 2020-03-19 MED ORDER — METHYLPREDNISOLONE ACETATE 40 MG/ML INJ SUSP (RADIOLOG
120.0000 mg | Freq: Once | INTRAMUSCULAR | Status: AC
  Administered 2020-03-19: 120 mg via EPIDURAL

## 2020-03-19 NOTE — Discharge Instructions (Signed)

## 2020-03-24 DIAGNOSIS — F99 Mental disorder, not otherwise specified: Secondary | ICD-10-CM | POA: Diagnosis not present

## 2020-04-07 DIAGNOSIS — F99 Mental disorder, not otherwise specified: Secondary | ICD-10-CM | POA: Diagnosis not present

## 2020-04-15 ENCOUNTER — Telehealth: Payer: Self-pay | Admitting: Internal Medicine

## 2020-04-15 NOTE — Telephone Encounter (Signed)
   1.Medication Requested: HYDROcodone-acetaminophen (NORCO/VICODIN) 5-325 MG tablet  2. Pharmacy (Name, Street, [place]):[facility] [address]  3. On Med List: yes  4. Last Visit with PCP: 02/18/20  5. Next visit date with PCP: 05/26/20   Agent: Please be advised that RX refills may take up to 3 business days. We ask that you follow-up with your pharmacy.

## 2020-04-16 MED ORDER — HYDROCODONE-ACETAMINOPHEN 5-325 MG PO TABS
1.0000 | ORAL_TABLET | Freq: Two times a day (BID) | ORAL | 0 refills | Status: DC | PRN
Start: 2020-04-16 — End: 2020-05-17

## 2020-04-16 NOTE — Telephone Encounter (Signed)
Sent in

## 2020-04-20 ENCOUNTER — Other Ambulatory Visit: Payer: Self-pay | Admitting: Internal Medicine

## 2020-04-20 NOTE — Telephone Encounter (Signed)
Ok to forward to PCP

## 2020-04-21 ENCOUNTER — Ambulatory Visit: Payer: Medicare Other | Admitting: Family Medicine

## 2020-04-21 DIAGNOSIS — F99 Mental disorder, not otherwise specified: Secondary | ICD-10-CM | POA: Diagnosis not present

## 2020-04-23 NOTE — Progress Notes (Signed)
[place] [address] [place] Phone: [phone] Subjective:   I Kandace Blitz am serving as a Education administrator for Dr. Hulan Saas.  This visit occurred during the SARS-CoV-2 public health emergency.  Safety protocols were in place, including screening questions prior to the visit, additional usage of staff PPE, and extensive cleaning of exam room while observing appropriate contact time as indicated for disinfecting solutions.   I'm seeing this patient by the request  of:  [name], MD  CC: Multiple joint complaints  [id number]   03/09/2020 Chronic back pain with worsening symptoms.  We discussed again at this length.  Patient would like to try another epidural.  We discussed different medications patient is on the cyclobenzaprine at this moment.  Patient is getting narcotics from primary care provider.  We discussed Cymbalta I think being a highly good possibility of patient wants to try other medications.  Patient will consider it but did not want to start it today patient would like to try the epidural and see how patient responds.  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  Patient wants to hold at this time and see how the epidural does.  Patient knows that if any worsening symptoms she needs to seek medical attention immediately.  Total time with patient today 37 minutes.  Update 04/26/2020 Nakeysha Pasqual is a 70 y.o. female coming in with complaint of low back pain. Epidural on 03/19/2020. Patient states that due to the weather and shoveling snow she is in pain. Left knee pain. States she had back neck pain about 5 days ago. Neck was very stiff at the time. Neck pain caused a headache.  Patient also has been caring for her son who recently had a inhalation injury where he has not been able to be as active.  Patient has been doing more up and down stairs that seems  to have aggravated the knee as well as the neck and back.      Past Medical History:  Diagnosis Date  . Acute duodenitis 04/24/2017  . Allergy   . ANEMIA   . Anxiety   . BACK PAIN, LUMBAR   . Cancer of ascending colon pTispN0 s/p colectomy 03/05/2009 02/18/2010   Qualifier: Diagnosis of  By: Carlean Purl MD, Dimas Millin   . CHF (congestive heart failure) ([place])   . Complete rotator cuff tear of left shoulder 11/27/2013   Ultrasound guided injection on November 27, 2013   . COPD (chronic obstructive pulmonary disease) with emphysema ([place])   . COPD exacerbation ([place]) 11/14/2017  . Crohn's  02/2010   ileal ulcers, intol of entercort--refuses treatment  . Emphysema of lung ([place])   . GERD   . History of blood transfusion    w/ c/s surgery  . History of transfusion of whole blood   . HYPERLIPIDEMIA   . HYPERTENSION   . Microscopic hematuria    chronic  . Myocardial infarction ([place]) 2010  . Obstruction of intestine or colon ([facility])    adhesions  . OSTEOARTHRITIS   . Rectal fissure    Possible fissure  . Rotator cuff tear   . Takotsubo syndrome 12/2008  . URINARY INCONTINENCE    Past Surgical History:  Procedure Laterality Date  . ABDOMINAL HYSTERECTOMY  1994  . APPENDECTOMY  1964  . BLADDER SUSPENSION    . CESAREAN SECTION     x 3  .  CHOLECYSTECTOMY  1995  . COLONOSCOPY  2017  . COLONOSCOPY W/ BIOPSIES AND POLYPECTOMY  01/24/2011   (Crohn's)ileitis, internal hemorrhoids  . ECTOPIC PREGNANCY SURGERY    . ESOPHAGOGASTRODUODENOSCOPY    . FACIAL COSMETIC SURGERY    . HAND SURGERY Bilateral 1991   x 2  . KNEE SURGERY Right 1981  . LYSIS OF ADHESION  2010   ex lap/LOA for SBO Dr Excell Seltzer  . ORBITAL FRACTURE SURGERY  1990  . RIGHT COLECTOMY  03/2009   Right colectomy for colon CA.  Dr Donne Hazel  . TONSILLECTOMY  1952  . TOTAL KNEE ARTHROPLASTY  03/2010   Dr Alvan Dame.  Depuy  . UPPER GASTROINTESTINAL ENDOSCOPY  05/16/2010   normal  . UTERINE SUSPENSION    . VIDEO BRONCHOSCOPY  Bilateral 12/18/2014   Procedure: VIDEO BRONCHOSCOPY WITHOUT FLUORO;  Surgeon: Tanda Rockers, MD;  Location: [facility];  Service: Cardiopulmonary;  Laterality: Bilateral;   Social History   Socioeconomic History  . Marital status: Widowed    Spouse name: Not on file  . Number of children: 5  . Years of education: 11  . Highest education level: High school graduate  Occupational History  . Occupation: Retired     Fish farm manager: UNEMPLOYED  Tobacco Use  . Smoking status: Current Every Day Smoker    Packs/day: 1.00    Years: 53.00    Pack years: 53.00    Types: Cigarettes  . Smokeless tobacco: Never Used  Vaping Use  . Vaping Use: Never used  Substance and Sexual Activity  . Alcohol use: Yes    Comment: occasional mixed drink  . Drug use: No  . Sexual activity: Not Currently    Birth control/protection: Surgical    Comment: Hysterectomy  Other Topics Concern  . Not on file  Social History Narrative   Widowed; has five children, 8 grandchildren and 4 great-grandchildren.   Social Determinants of Health   Financial Resource Strain: Not on file  Food Insecurity: Not on file  Transportation Needs: Not on file  Physical Activity: Not on file  Stress: Not on file  Social Connections: Not on file   Allergies  Allergen Reactions  . Morphine Nausea And Vomiting   Family History  Problem Relation Age of Onset  . Colon cancer Father 36  . Hypertension Father   . Heart disease Father   . Kidney disease Father   . Colon cancer Sister 64  . Liver cancer Sister   . Other Sister        amyloidosis  . Throat cancer Mother   . Arthritis Other        Parent, other relative  . Stomach cancer Neg Hx   . Rectal cancer Neg Hx      Current Outpatient Medications (Cardiovascular):  .  metoprolol tartrate (LOPRESSOR) 25 MG tablet, Take 1 tablet by mouth twice daily .  rosuvastatin (CRESTOR) 10 MG tablet, Take 1 tablet by mouth five times a week Monday-Friday  Current Outpatient  Medications (Respiratory):  .  albuterol (PROVENTIL) (2.5 MG/3ML) 0.083% nebulizer solution, Take 3 mLs (2.5 mg total) by nebulization every 6 (six) hours as needed for wheezing or shortness of breath. [name]  albuterol (VENTOLIN HFA) 108 (90 Base) MCG/ACT inhaler, INHALE 2 PUFFS BY MOUTH EVERY 4 HOURS AS NEEDED FOR WHEEZING OR SHORTNESS OF BREATH .  fluticasone (FLONASE) 50 MCG/ACT nasal spray, Place 2 sprays into both nostrils daily.  Current Outpatient Medications (Analgesics):  .  aspirin EC 81 MG tablet,  Take 81 mg by mouth daily. [name]  HYDROcodone-acetaminophen (NORCO/VICODIN) 5-325 MG tablet, Take 1 tablet by mouth 2 (two) times daily as needed for moderate pain (pain).   Current Outpatient Medications (Other):  .  cyclobenzaprine (FLEXERIL) 10 MG tablet, Take 1 tablet by mouth three times daily as needed for muscle spasm   Reviewed prior external information including notes and imaging from  primary care provider As well as notes that were available from care everywhere and other healthcare systems.  Past medical history, social, surgical and family history all reviewed in electronic medical record.  No pertanent information unless stated regarding to the chief complaint.   Review of Systems:  No , visual changes, nausea, vomiting, diarrhea, constipation, dizziness,  skin rash, fevers, chills, night sweats, weight loss, swollen lymph nodes,joint swelling, chest pain, shortness of breath, mood changes. POSITIVE muscle aches, body aches, abdominal pain, headache  Objective  Blood pressure 130/70, pulse 62, height 5' 1"  (1.549 m), weight 120 lb (54.4 kg), SpO2 95 %.   General: No apparent distress alert and oriented x3 mood and affect shows patient is somewhat more anxious than usual. 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  Left knee does have arthritic changes.  Mild instability noted with valgus and varus force.  Crepitus noted with range of motion exercises.  Patient lacks the last 2 degrees of extension.  Tender to palpation over the medial joint line.  Mild over the patellofemoral joint as well.  Neck exam just has more muscle tightness on the left side of the neck.  Seems to be more of the sternocleidomastoid.  No temporal pain noted.  Patient does have some mild limited range of motion of the neck in all planes.  Patient does have voluntary guarding noted.  Tightness of the trapezius is also noted.  No masses appreciated in the neck.  Low back tender to palpation seems to be neurovascularly intact of the lower extremities did not do further testing today.     Impression and Recommendations:     The above documentation has been reviewed and is accurate and complete Lyndal Pulley, DO

## 2020-04-26 ENCOUNTER — Encounter: Payer: Self-pay | Admitting: Family Medicine

## 2020-04-26 ENCOUNTER — Ambulatory Visit (INDEPENDENT_AMBULATORY_CARE_PROVIDER_SITE_OTHER): Payer: Medicare Other | Admitting: Family Medicine

## 2020-04-26 ENCOUNTER — Other Ambulatory Visit: Payer: Self-pay

## 2020-04-26 ENCOUNTER — Ambulatory Visit (INDEPENDENT_AMBULATORY_CARE_PROVIDER_SITE_OTHER): Payer: Medicare Other

## 2020-04-26 VITALS — BP 130/70 | HR 62 | Ht 61.0 in | Wt 120.0 lb

## 2020-04-26 DIAGNOSIS — F1120 Opioid dependence, uncomplicated: Secondary | ICD-10-CM | POA: Diagnosis not present

## 2020-04-26 DIAGNOSIS — M5010 Cervical disc disorder with radiculopathy, unspecified cervical region: Secondary | ICD-10-CM

## 2020-04-26 DIAGNOSIS — M25562 Pain in left knee: Secondary | ICD-10-CM | POA: Diagnosis not present

## 2020-04-26 DIAGNOSIS — G8929 Other chronic pain: Secondary | ICD-10-CM

## 2020-04-26 DIAGNOSIS — M1712 Unilateral primary osteoarthritis, left knee: Secondary | ICD-10-CM

## 2020-04-26 DIAGNOSIS — M542 Cervicalgia: Secondary | ICD-10-CM

## 2020-04-26 NOTE — Patient Instructions (Addendum)
Good to see you Xray of the knee Sorry about your Son Hinge brace for the knee For the neck try the voltaren gel or try lidocaine patch over the counter If severely worsening pain seek medical attention immediatly Someone else shovel next time See me again in 2-3 months

## 2020-04-26 NOTE — Assessment & Plan Note (Signed)
Receiving medication from primary care provider

## 2020-04-26 NOTE — Assessment & Plan Note (Addendum)
Seems to be more muscular this time.  Very difficult to assess with patient's other comorbidities.  We discussed with patient in great length and patient wanted to try the conservative approach first.  We discussed if any worsening of the headaches especially in the next few days or any radiation of pain in the extremities to call us or seek medical attention immediately.  Patient is in agreement with the plan and will try conservative approach first.

## 2020-04-26 NOTE — Assessment & Plan Note (Signed)
Patient has known arthritic changes.  Patient did do a lot of shoveling and I think patient has more of a muscle injury with tightness.  Patient does have a muscle relaxer from primary care provider and can take that.  We discussed with patient about topical anti-inflammatories, home exercises for range of motion and icing.  Patient wants to hold on any epidural of the neck which I think is a good idea.  Follow-up with me again 8 weeks but we did discuss if anything such as headache, nausea and vomiting occur she needs to seek medical attention immediately.

## 2020-04-26 NOTE — Assessment & Plan Note (Signed)
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  X-rays from 2018 showed moderate arthritic changes.  A hinged brace given today for stability.  Patient was doing a lot of shoveling that likely contributed to it.  Very mild instability with valgus and varus force.  Crepitus noted.  Increase activity slowly.  Follow-up again in 2 months.  Worsening pain can consider injection

## 2020-04-28 DIAGNOSIS — F99 Mental disorder, not otherwise specified: Secondary | ICD-10-CM | POA: Diagnosis not present

## 2020-04-30 ENCOUNTER — Telehealth: Payer: Self-pay | Admitting: Family Medicine

## 2020-04-30 NOTE — Telephone Encounter (Signed)
She can try it but it would be over the counter.  If pain worsens she needs to go to urgent care or ER

## 2020-04-30 NOTE — Telephone Encounter (Signed)
Left message for patient with MD response.

## 2020-04-30 NOTE — Telephone Encounter (Signed)
Patient called stating that she has tried the Voltaren gel and lidocaine patches but she has not had any relief on the pain in her neck. She asked if a neck brace or something similar would help to give her some relief. The pain seems to be very constant and makes it very hard for her to sleep at night.

## 2020-05-03 NOTE — Progress Notes (Unsigned)
[facility] Phone: [phone] Subjective:   [name], am serving as a scribe for Dr. Hulan Saas. This visit occurred during the SARS-CoV-2 public health emergency.  Safety protocols were in place, including screening questions prior to the visit, additional usage of staff PPE, and extensive cleaning of exam room while observing appropriate contact time as indicated for disinfecting solutions.   I'm seeing this patient by the request  of:  [name], MD  CC: Neck pain follow-up  AQT:[id number]   04/26/2020 History of replacement on the contralateral side.  X-rays from 2018 showed moderate arthritic changes.  A hinged brace given today for stability.  Patient was doing a lot of shoveling that likely contributed to it.  Very mild instability with valgus and varus force.  Crepitus noted.  Increase activity slowly.  Follow-up again in 2 months.  Worsening pain can consider injection   Receiving medication from primary care provider  Patient has known arthritic changes.  Patient did do a lot of shoveling and I think patient has more of a muscle injury with tightness.  Patient does have a muscle relaxer from primary care provider and can take that.  We discussed with patient about topical anti-inflammatories, home exercises for range of motion and icing.  Patient wants to hold on any epidural of the neck which I think is a good idea.  Follow-up with me again 8 weeks but we did discuss if anything such as headache, nausea and vomiting occur she needs to seek medical attention immediately.  Update 05/04/2020 Erica [name] is a 70 y.o. female coming in with complaint of neck pain. Patient notes pain in left shoulder that radiates into left side of neck. Having trouble going to sleep due to pain due to sharp pain. Patient has been having headaches due to pain.  Patient states that it seems to be worsening and is  giving her difficulty where patient has not been able to sleep appropriately.  Patient denies any significant radiation down the arm.  States that any type of sharp movements causes more discomfort.  Seems to be more related to the neck and the shoulder itself.  Patient is continuing to take her chronic narcotics and has changed timing of it so patient can get some sleep.      Past Medical History:  Diagnosis Date  . Acute duodenitis 04/24/2017  . Allergy   . ANEMIA   . Anxiety   . BACK PAIN, LUMBAR   . Cancer of ascending colon [id number] s/p colectomy 03/05/2009 02/18/2010   Qualifier: Diagnosis of  By: Carlean Purl MD, Dimas Millin   . CHF (congestive heart failure) ([place])   . Complete rotator cuff tear of left shoulder 11/27/2013   Ultrasound guided injection on November 27, 2013   . COPD (chronic obstructive pulmonary disease) with emphysema ([place])   . COPD exacerbation ([place]) 11/14/2017  . Crohn's  02/2010   ileal ulcers, intol of entercort--refuses treatment  . Emphysema of lung ([place])   . GERD   . History of blood transfusion    w/ c/s surgery  . History of transfusion of whole blood   . HYPERLIPIDEMIA   . HYPERTENSION   . Microscopic hematuria    chronic  . Myocardial infarction ([place]) 2010  . Obstruction of intestine or colon ([facility])    adhesions  . OSTEOARTHRITIS   . Rectal fissure    Possible fissure  . Rotator cuff  tear   . Takotsubo syndrome 12/2008  . URINARY INCONTINENCE    Past Surgical History:  Procedure Laterality Date  . ABDOMINAL HYSTERECTOMY  1994  . APPENDECTOMY  1964  . BLADDER SUSPENSION    . CESAREAN SECTION     x 3  . CHOLECYSTECTOMY  1995  . COLONOSCOPY  2017  . COLONOSCOPY W/ BIOPSIES AND POLYPECTOMY  01/24/2011   (Crohn's)ileitis, internal hemorrhoids  . ECTOPIC PREGNANCY SURGERY    . ESOPHAGOGASTRODUODENOSCOPY    . FACIAL COSMETIC SURGERY    . HAND SURGERY Bilateral 1991   x 2  . KNEE SURGERY Right 1981  . LYSIS OF ADHESION  2010   ex lap/LOA for  SBO Dr Excell Seltzer  . ORBITAL FRACTURE SURGERY  1990  . RIGHT COLECTOMY  03/2009   Right colectomy for colon CA.  Dr Donne Hazel  . TONSILLECTOMY  1952  . TOTAL KNEE ARTHROPLASTY  03/2010   Dr Alvan Dame.  Depuy  . UPPER GASTROINTESTINAL ENDOSCOPY  05/16/2010   normal  . UTERINE SUSPENSION    . VIDEO BRONCHOSCOPY Bilateral 12/18/2014   Procedure: VIDEO BRONCHOSCOPY WITHOUT FLUORO;  Surgeon: Tanda Rockers, MD;  Location: [facility];  Service: Cardiopulmonary;  Laterality: Bilateral;   Social History   Socioeconomic History  . Marital status: Widowed    Spouse name: Not on file  . Number of children: 5  . Years of education: 52  . Highest education level: High school graduate  Occupational History  . Occupation: Retired     Fish farm manager: UNEMPLOYED  Tobacco Use  . Smoking status: Current Every Day Smoker    Packs/day: 1.00    Years: 53.00    Pack years: 53.00    Types: Cigarettes  . Smokeless tobacco: Never Used  Vaping Use  . Vaping Use: Never used  Substance and Sexual Activity  . Alcohol use: Yes    Comment: occasional mixed drink  . Drug use: No  . Sexual activity: Not Currently    Birth control/protection: Surgical    Comment: Hysterectomy  Other Topics Concern  . Not on file  Social History Narrative   Widowed; has five children, 8 grandchildren and 4 great-grandchildren.   Social Determinants of Health   Financial Resource Strain: Not on file  Food Insecurity: Not on file  Transportation Needs: Not on file  Physical Activity: Not on file  Stress: Not on file  Social Connections: Not on file   Allergies  Allergen Reactions  . Morphine Nausea And Vomiting   Family History  Problem Relation Age of Onset  . Colon cancer Father 70  . Hypertension Father   . Heart disease Father   . Kidney disease Father   . Colon cancer Sister 67  . Liver cancer Sister   . Other Sister        amyloidosis  . Throat cancer Mother   . Arthritis Other        Parent, other relative   . Stomach cancer Neg Hx   . Rectal cancer Neg Hx     Current Outpatient Medications (Endocrine & Metabolic):  .  predniSONE (DELTASONE) 20 MG tablet, Take 1 tablet (20 mg total) by mouth daily with breakfast.  Current Outpatient Medications (Cardiovascular):  .  metoprolol tartrate (LOPRESSOR) 25 MG tablet, Take 1 tablet by mouth twice daily .  rosuvastatin (CRESTOR) 10 MG tablet, Take 1 tablet by mouth five times a week Monday-Friday  Current Outpatient Medications (Respiratory):  .  albuterol (PROVENTIL) (2.5 MG/3ML) 0.083% nebulizer  solution, Take 3 mLs (2.5 mg total) by nebulization every 6 (six) hours as needed for wheezing or shortness of breath. [name]  albuterol (VENTOLIN HFA) 108 (90 Base) MCG/ACT inhaler, INHALE 2 PUFFS BY MOUTH EVERY 4 HOURS AS NEEDED FOR WHEEZING OR SHORTNESS OF BREATH .  fluticasone (FLONASE) 50 MCG/ACT nasal spray, Place 2 sprays into both nostrils daily.  Current Outpatient Medications (Analgesics):  .  aspirin EC 81 MG tablet, Take 81 mg by mouth daily. [name]  HYDROcodone-acetaminophen (NORCO/VICODIN) 5-325 MG tablet, Take 1 tablet by mouth 2 (two) times daily as needed for moderate pain (pain).   Current Outpatient Medications (Other):  .  cyclobenzaprine (FLEXERIL) 10 MG tablet, Take 1 tablet by mouth three times daily as needed for muscle spasm   Reviewed prior external information including notes and imaging from  primary care provider As well as notes that were available from care everywhere and other healthcare systems.  Past medical history, social, surgical and family history all reviewed in electronic medical record.  No pertanent information unless stated regarding to the chief complaint.   Review of Systems:  No headache, visual changes, nausea, vomiting, diarrhea, constipation, dizziness, abdominal pain, skin rash, fevers, chills, night sweats, weight loss, swollen lymph nodes, body aches, joint swelling, chest pain, shortness of breath, mood  changes. POSITIVE muscle aches  Objective  Blood pressure 122/84, height 5' 1"  (1.549 m), weight 119 lb (54 kg).   General: No apparent distress alert and oriented x3 patient appears to be very uncomfortable. HEENT: Pupils equal, extraocular movements intact  Respiratory: Patient's speak in full sentences and does not appear short of breath  Cardiovascular: No lower extremity edema, non tender, no erythema  Gait normal with good balance and coordination.  MSK: Significant arthritic changes of multiple joints. Patient does have tightness noted of the right side of the neck that is quite severe.  Patient has limited rotation bilaterally.  Patient is severely tender to palpation in the musculature around C5-C7 on the left side.  No spinous process tenderness.  Patient does have arthritic changes in the hands good grip strength seems to be the same.  Patient has good range of motion of the shoulder with no significant impingement.  Rotator cuff appears to be intact but patient does have some mild crepitus.    Impression and Recommendations:     The above documentation has been reviewed and is accurate and complete Lyndal Pulley, DO

## 2020-05-04 ENCOUNTER — Other Ambulatory Visit: Payer: Self-pay

## 2020-05-04 ENCOUNTER — Ambulatory Visit (INDEPENDENT_AMBULATORY_CARE_PROVIDER_SITE_OTHER): Payer: Medicare Other | Admitting: Family Medicine

## 2020-05-04 ENCOUNTER — Encounter: Payer: Self-pay | Admitting: Family Medicine

## 2020-05-04 ENCOUNTER — Ambulatory Visit (INDEPENDENT_AMBULATORY_CARE_PROVIDER_SITE_OTHER): Payer: Medicare Other

## 2020-05-04 VITALS — BP 122/84 | Ht 61.0 in | Wt 119.0 lb

## 2020-05-04 DIAGNOSIS — M542 Cervicalgia: Secondary | ICD-10-CM

## 2020-05-04 DIAGNOSIS — M5010 Cervical disc disorder with radiculopathy, unspecified cervical region: Secondary | ICD-10-CM | POA: Diagnosis not present

## 2020-05-04 DIAGNOSIS — M2550 Pain in unspecified joint: Secondary | ICD-10-CM

## 2020-05-04 DIAGNOSIS — E559 Vitamin D deficiency, unspecified: Secondary | ICD-10-CM

## 2020-05-04 LAB — COMPREHENSIVE METABOLIC PANEL
ALT: 10 U/L (ref 0–35)
AST: 14 U/L (ref 0–37)
Albumin: 4.2 g/dL (ref 3.5–5.2)
Alkaline Phosphatase: 65 U/L (ref 39–117)
BUN: 12 mg/dL (ref 6–23)
CO2: 32 mEq/L (ref 19–32)
Calcium: 9.8 mg/dL (ref 8.4–10.5)
Chloride: 103 mEq/L (ref 96–112)
Creatinine, Ser: 0.67 mg/dL (ref 0.40–1.20)
GFR: 88.73 mL/min (ref >60.00)
Glucose, Bld: 66 mg/dL — ABNORMAL LOW (ref 70–99)
Potassium: 4.3 mEq/L (ref 3.5–5.1)
Sodium: 141 mEq/L (ref 135–145)
Total Bilirubin: 0.3 mg/dL (ref 0.2–1.2)
Total Protein: 6.9 g/dL (ref 6.0–8.3)

## 2020-05-04 LAB — CBC WITH DIFFERENTIAL/PLATELET
Basophils Absolute: 0.1 10*3/uL (ref 0.0–0.1)
Basophils Relative: 1.1 % (ref 0.0–3.0)
Eosinophils Absolute: 0.1 10*3/uL (ref 0.0–0.7)
Eosinophils Relative: 1.3 % (ref 0.0–5.0)
HCT: 42.4 % (ref 36.0–46.0)
Hemoglobin: 14.2 g/dL (ref 12.0–15.0)
Lymphocytes Relative: 33.4 % (ref 12.0–46.0)
Lymphs Abs: 2.4 10*3/uL (ref 0.7–4.0)
MCHC: 33.4 g/dL (ref 30.0–36.0)
MCV: 93.1 fl (ref 78.0–100.0)
Monocytes Absolute: 0.6 10*3/uL (ref 0.1–1.0)
Monocytes Relative: 8.6 % (ref 3.0–12.0)
Neutro Abs: 3.9 10*3/uL (ref 1.4–7.7)
Neutrophils Relative %: 55.6 % (ref 43.0–77.0)
Platelets: 274 10*3/uL (ref 150.0–400.0)
RBC: 4.56 Mil/uL (ref 3.87–5.11)
RDW: 13.2 % (ref 11.5–15.5)
WBC: 7.1 10*3/uL (ref 4.0–10.5)

## 2020-05-04 LAB — SEDIMENTATION RATE: Sed Rate: 21 mm/hr (ref 0–30)

## 2020-05-04 LAB — VITAMIN D 25 HYDROXY (VIT D DEFICIENCY, FRACTURES): VITD: 32.89 ng/mL (ref 30.00–100.00)

## 2020-05-04 MED ORDER — METHYLPREDNISOLONE ACETATE 40 MG/ML IJ SUSP
40.0000 mg | Freq: Once | INTRAMUSCULAR | Status: AC
  Administered 2020-05-04: 40 mg via INTRAMUSCULAR

## 2020-05-04 MED ORDER — KETOROLAC TROMETHAMINE 30 MG/ML IJ SOLN
30.0000 mg | Freq: Once | INTRAMUSCULAR | Status: AC
  Administered 2020-05-04: 30 mg via INTRAMUSCULAR

## 2020-05-04 MED ORDER — PREDNISONE 20 MG PO TABS: 20.0000 mg | ORAL_TABLET | Freq: Every day | ORAL | 0 refills | Status: DC

## 2020-05-04 NOTE — Patient Instructions (Addendum)
Pred 20 mg daily for 5 days Xray neck on way out Take Flexeril at night Labs today If not better by end of week, will order MRI Go to ED if pain worsens  Move appt to beginning of March

## 2020-05-04 NOTE — Assessment & Plan Note (Addendum)
Chronic problem with exacerbation.  Patient has known arthritic changes.  Patient was doing a lot of shoveling.  Patient does have the muscle relaxer.  Discussed such medications such as gabapentin or nortriptyline that could be beneficial but due to patient's age and comorbidities we would need to start at a low dose.  Patient has responded well to epidurals previously.  I would like repeat x-rays today patient's MRI is from 2016.  Patient given a short course of prednisone.  Patient due to stomach issues is unable to take oral anti-inflammatories but will give Toradol and Depo-Medrol today.  Patient we discussed watch her stomach at this point.  Patient is already on chronic narcotics.  Patient does have Flexeril and encouraged her to take it at night.  Worsening pain needs to be seen in the emergency room.  We will get laboratory work-up including ESR with patient having some mild associated headaches.  No significant weakness noted of the extremities at the moment.  I do feel that patient may need a new MRI if this continues and possibly repeating the epidurals.  Patient is in agreement with the plan and follow-up again in 3 to 4 weeks otherwise.

## 2020-05-05 DIAGNOSIS — F99 Mental disorder, not otherwise specified: Secondary | ICD-10-CM | POA: Diagnosis not present

## 2020-05-06 LAB — PTH, INTACT AND CALCIUM
Calcium: 9.8 mg/dL (ref 8.6–10.4)
PTH: 68 pg/mL — ABNORMAL HIGH (ref 14–64)

## 2020-05-06 LAB — CALCIUM, IONIZED: Calcium, Ion: 4.92 mg/dL (ref 4.8–5.6)

## 2020-05-07 ENCOUNTER — Telehealth: Payer: Self-pay

## 2020-05-07 NOTE — Telephone Encounter (Signed)
error 

## 2020-05-07 NOTE — Telephone Encounter (Signed)
I replied in my chart but overall normal

## 2020-05-07 NOTE — Telephone Encounter (Signed)
Patient calling because she had a few questions about her lab work and would like a call back to discuss.

## 2020-05-07 NOTE — Telephone Encounter (Signed)
Spoke with patient per results.

## 2020-05-12 DIAGNOSIS — F99 Mental disorder, not otherwise specified: Secondary | ICD-10-CM | POA: Diagnosis not present

## 2020-05-17 ENCOUNTER — Telehealth: Payer: Self-pay | Admitting: Internal Medicine

## 2020-05-17 MED ORDER — HYDROCODONE-ACETAMINOPHEN 5-325 MG PO TABS: 1.0000 | ORAL_TABLET | Freq: Two times a day (BID) | ORAL | 0 refills | Status: DC | PRN

## 2020-05-17 NOTE — Telephone Encounter (Signed)
1.Medication Requested: HYDROcodone-acetaminophen (NORCO/VICODIN) 5-325 MG tablet    2. Pharmacy (Name, Street, [place]): [facility]  3. On Med List: yes   4. Last Visit with PCP: 11.17.21  5. Next visit date with PCP:  2.23.22   Agent: Please be advised that RX refills may take up to 3 business days. We ask that you follow-up with your pharmacy.

## 2020-05-17 NOTE — Telephone Encounter (Signed)
See below

## 2020-05-18 ENCOUNTER — Telehealth: Payer: Self-pay | Admitting: Family Medicine

## 2020-05-18 ENCOUNTER — Other Ambulatory Visit: Payer: Self-pay

## 2020-05-18 DIAGNOSIS — M5010 Cervical disc disorder with radiculopathy, unspecified cervical region: Secondary | ICD-10-CM

## 2020-05-18 NOTE — Telephone Encounter (Signed)
Patient called asking if it would be possible for her to receive more injections for her shoulder and neck pain? She asked if you could call her to discuss this.

## 2020-05-18 NOTE — Telephone Encounter (Signed)
Spoke with patient who is going to call [facility] to schedule appt.

## 2020-05-18 NOTE — Progress Notes (Signed)
I, Erica [name], LAT, ATC, am serving as scribe for Dr. Lynne Leader.  Erica [name] is a 70 y.o. female who presents to [facility] [facility] today for f/u of neck and L shoulder pain and to get an injection.  She was last seen by Dr. [name] on 05/04/20 w/ con't neck/L shoulder pain and was given an IM Toradol/Depomedrol injection.  She was also prescribed a short course of prednisone. She was referred for an updated cervical MRI.  Since her last visit, pt reports L shoulder and neck pain started to get worse 2 days ago. Pt c/o difficulty sleeping because of the pain. Pt report reports pain radiates up into L-side of neck. Pt locates pain to the L side of C-spine and into trapz.  Additionally she notes pain in the left anterior shoulder but not much to the left upper arm.  Pain is worse with neck motion.  She does not have pain radiating down the arm past the level of the elbow.  Diagnostic imaging: C-spine XR- 05/04/20; C-spine and R shoulder MRI- 08/02/14  Pertinent review of systems: No fevers or chills  Relevant historical information: Takotsubo syndrome.  COPD.  Crohn's disease. History colon cancer.  Exam:  BP 130/79 (BP Location: Right Arm, Patient Position: Sitting, Cuff Size: Normal)   Ht 5' 1"  (1.549 m)   Wt 118 lb 6.4 oz (53.7 kg)   LMP  (LMP Unknown)   BMI 22.37 kg/m  General: Well Developed, well nourished, and in no acute distress.   MSK: C-spine normal-appearing Nontender midline. Tender palpation left trapezius and paraspinal musculature. Decreased cervical motion. Left shoulder normal-appearing mildly tender palpation anterior shoulder. Decreased abduction.  External/internal rotation range of motion are intact. Strength is intact abduction external and internal rotation. Pulses cap refill and sensation are intact distally.    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  AC DJD. Await formal radiology review  Patient was given IM injection of Depo-Medrol and Toradol as listed below prior to discharge.  Assessment and Plan: 70 y.o. female with left lateral neck pain and trapezius pain thought to be muscle related to DJD and muscle spasm and dysfunction.  She has an MRI of her cervical spine pending for March 6.  This should help determine future treatment.  In the meantime however we will also refer to physical therapy as this should be helpful for the muscular component of her pain.  Anticipate that she will also proceed with epidural steroid injection or facet injection following MRI.  Recommend schedule follow-up appointment with myself or Dr. [name] following MRI during the second week of March.  Left shoulder pain.  Thought to be more related to neck dysfunction than shoulder etiology.  Does have some glenohumeral DJD on x-ray.   PDMP not reviewed this encounter. Orders Placed This Encounter  Procedures  . DG Shoulder Left    Standing Status:   Future    Number of Occurrences:   1    Standing Expiration Date:   05/19/2021    Order Specific Question:   Reason for Exam (SYMPTOM  OR DIAGNOSIS REQUIRED)    Answer:   eval shoulder pain    Order Specific Question:   Preferred imaging location?    Answer:   [facility]  . Ambulatory referral to Physical Therapy    Referral Priority:   Routine  Referral Type:   Physical Medicine    Referral Reason:   Specialty Services Required    Requested Specialty:   Physical Therapy   Meds ordered this encounter  Medications  . ketorolac (TORADOL) 30 MG/ML injection 30 mg  . methylPREDNISolone acetate (DEPO-MEDROL) injection 80 mg     Discussed warning signs or symptoms. Please see discharge instructions. Patient expresses understanding.   The above documentation has been reviewed and is accurate and complete Lynne Leader, M.D.

## 2020-05-18 NOTE — Telephone Encounter (Signed)
Yes Just let me know when she is ready for the cocktail and I will come back there and take care of it. Just inform Dr. Georgina Snell of this message so he is in the loop

## 2020-05-18 NOTE — Telephone Encounter (Signed)
I think we need MRI of the neck

## 2020-05-19 ENCOUNTER — Other Ambulatory Visit: Payer: Self-pay

## 2020-05-19 ENCOUNTER — Ambulatory Visit (INDEPENDENT_AMBULATORY_CARE_PROVIDER_SITE_OTHER): Payer: Medicare Other

## 2020-05-19 ENCOUNTER — Ambulatory Visit (INDEPENDENT_AMBULATORY_CARE_PROVIDER_SITE_OTHER): Payer: Medicare Other | Admitting: Family Medicine

## 2020-05-19 VITALS — BP 130/79 | Ht 61.0 in | Wt 118.4 lb

## 2020-05-19 DIAGNOSIS — M5010 Cervical disc disorder with radiculopathy, unspecified cervical region: Secondary | ICD-10-CM

## 2020-05-19 DIAGNOSIS — M25512 Pain in left shoulder: Secondary | ICD-10-CM

## 2020-05-19 DIAGNOSIS — M19012 Primary osteoarthritis, left shoulder: Secondary | ICD-10-CM | POA: Diagnosis not present

## 2020-05-19 DIAGNOSIS — F99 Mental disorder, not otherwise specified: Secondary | ICD-10-CM | POA: Diagnosis not present

## 2020-05-19 MED ORDER — KETOROLAC TROMETHAMINE 30 MG/ML IJ SOLN
30.0000 mg | Freq: Once | INTRAMUSCULAR | Status: AC
  Administered 2020-05-19: 30 mg via INTRAMUSCULAR

## 2020-05-19 MED ORDER — METHYLPREDNISOLONE ACETATE 80 MG/ML IJ SUSP
80.0000 mg | Freq: Once | INTRAMUSCULAR | Status: AC
  Administered 2020-05-19: 40 mg via INTRAMUSCULAR

## 2020-05-19 NOTE — Patient Instructions (Addendum)
Thank you for coming in today.  Please get an Xray today before you leave  I've referred you to Physical Therapy.  Let us know if you don't hear from them in one week.  I think it is a good idea to schedule an appointment with Dr [name] or my self after the MRI to go over the results and plan for next steps including injection.   Ok to schedule that follow up appointment now if you like.

## 2020-05-20 ENCOUNTER — Ambulatory Visit: Payer: Medicare Other | Admitting: Internal Medicine

## 2020-05-21 NOTE — Progress Notes (Signed)
Left shoulder mild arthritis.

## 2020-05-26 ENCOUNTER — Ambulatory Visit (INDEPENDENT_AMBULATORY_CARE_PROVIDER_SITE_OTHER): Payer: Medicare Other | Admitting: Internal Medicine

## 2020-05-26 ENCOUNTER — Encounter: Payer: Self-pay | Admitting: Internal Medicine

## 2020-05-26 ENCOUNTER — Other Ambulatory Visit: Payer: Self-pay

## 2020-05-26 DIAGNOSIS — F1120 Opioid dependence, uncomplicated: Secondary | ICD-10-CM | POA: Diagnosis not present

## 2020-05-26 DIAGNOSIS — I700 Atherosclerosis of aorta: Secondary | ICD-10-CM

## 2020-05-26 DIAGNOSIS — M5010 Cervical disc disorder with radiculopathy, unspecified cervical region: Secondary | ICD-10-CM

## 2020-05-26 MED ORDER — ROSUVASTATIN CALCIUM 10 MG PO TABS
ORAL_TABLET | ORAL | 3 refills | Status: DC
Start: 2020-05-26 — End: 2020-11-29

## 2020-05-26 NOTE — Patient Instructions (Signed)
We have refilled the crestor (rosuvastatin) so keep taking this 5 days a week to help lower the heart attack and stroke risk.  Think about stopping smoking as this cuts your risk of heart attack and stroke in half.

## 2020-05-26 NOTE — Progress Notes (Signed)
   Subjective:   Patient ID: Erica [name], female    DOB: 10-15-1950, 70 y.o.   MRN: [id number]  HPI The patient is a 70 YO female coming in for follow up of ongoing neck and back pain. She is having worsening pain and is under workup of cause and potential treatments. She does take hydrocodone BID and this is generally effective to help her manage throughout the day. She is still in pain but tolerable. She denies injury. Some numbness which is stable. She is still smoking cigarettes and does not feel able to make an attempt to quit now.  Review of Systems  Constitutional: Negative.   HENT: Negative.   Eyes: Negative.   Respiratory: Negative for cough, chest tightness and shortness of breath.   Cardiovascular: Negative for chest pain, palpitations and leg swelling.  Gastrointestinal: Negative for abdominal distention, abdominal pain, constipation, diarrhea, nausea and vomiting.  Musculoskeletal: Positive for arthralgias, back pain and neck pain.  Skin: Negative.   Neurological: Negative.   Psychiatric/Behavioral: Negative.     Objective:  Physical Exam Constitutional:      Appearance: She is well-developed and well-nourished.  HENT:     Head: Normocephalic and atraumatic.  Eyes:     Extraocular Movements: EOM normal.  Cardiovascular:     Rate and Rhythm: Normal rate and regular rhythm.  Pulmonary:     Effort: Pulmonary effort is normal. No respiratory distress.     Breath sounds: Normal breath sounds. No wheezing or rales.  Abdominal:     General: Bowel sounds are normal. There is no distension.     Palpations: Abdomen is soft.     Tenderness: There is no abdominal tenderness. There is no rebound.  Musculoskeletal:        General: Tenderness present. No edema.     Cervical back: Normal range of motion.  Skin:    General: Skin is warm and dry.  Neurological:     Mental Status: She is alert and oriented to person, place, and time.     Coordination: Coordination normal.   Psychiatric:        Mood and Affect: Mood and affect normal.     Vitals:   05/26/20 1012  BP: 128/78  Pulse: 75  Resp: 18  Temp: 98 F (36.7 C)  TempSrc: Oral  SpO2: 99%  Weight: 115 lb (52.2 kg)  Height: 5' 1"  (1.549 m)    This visit occurred during the SARS-CoV-2 public health emergency.  Safety protocols were in place, including screening questions prior to the visit, additional usage of staff PPE, and extensive cleaning of exam room while observing appropriate contact time as indicated for disinfecting solutions.   Assessment & Plan:

## 2020-05-28 DIAGNOSIS — I700 Atherosclerosis of aorta: Secondary | ICD-10-CM | POA: Insufficient documentation

## 2020-05-28 NOTE — Assessment & Plan Note (Signed)
Refilled crestor 5 mg daily which she had stopped taking due to lack of refills. No side effects. She is at high risk due to smoking.

## 2020-05-28 NOTE — Assessment & Plan Note (Signed)
[place] narcotic database reviewed and appropriate. Refill hydrocodone when due #60 per month.

## 2020-05-28 NOTE — Assessment & Plan Note (Signed)
Undergoing workup for treatment options currently.

## 2020-06-01 ENCOUNTER — Ambulatory Visit: Payer: Medicare Other | Admitting: Family Medicine

## 2020-06-02 DIAGNOSIS — F99 Mental disorder, not otherwise specified: Secondary | ICD-10-CM | POA: Diagnosis not present

## 2020-06-03 DIAGNOSIS — F99 Mental disorder, not otherwise specified: Secondary | ICD-10-CM | POA: Diagnosis not present

## 2020-06-06 ENCOUNTER — Other Ambulatory Visit: Payer: Self-pay

## 2020-06-06 ENCOUNTER — Ambulatory Visit
Admission: RE | Admit: 2020-06-06 | Discharge: 2020-06-06 | Disposition: A | Discharge status: Home / Self Care | Payer: Medicare Other | Source: Ambulatory Visit | Attending: Family Medicine | Admitting: Family Medicine

## 2020-06-06 DIAGNOSIS — M5010 Cervical disc disorder with radiculopathy, unspecified cervical region: Secondary | ICD-10-CM

## 2020-06-06 DIAGNOSIS — M4802 Spinal stenosis, cervical region: Secondary | ICD-10-CM | POA: Diagnosis not present

## 2020-06-07 NOTE — Progress Notes (Signed)
Erica [name] Phone: [phone] Subjective:   Erica [name], am serving as a scribe for Dr. Hulan Saas. This visit occurred during the SARS-CoV-2 public health emergency.  Safety protocols were in place, including screening questions prior to the visit, additional usage of staff PPE, and extensive cleaning of exam room while observing appropriate contact time as indicated for disinfecting solutions.   I'm seeing this patient by the request  of:  [name], MD  CC: Neck pain follow-up  [id number]  Erica [name] is a 70 y.o. female coming in with complaint of cervical spine disorder. Patient has had xray previously. Is here for MRI results.   Cervical MRI 06/05/2020 MPRESSION: 1. Prominent reactive marrow edema about the left C2-3 facet due to facet arthritis. Finding could serve as a source for left posterior neck pain. 2. Degenerative spondylosis and facet arthrosis at C3-4 through C5-6 with resultant mild to moderate right C4 through C6 foraminal narrowing. 3. Broad central disc protrusion at C6-7 with resultant mild spinal stenosis.    Past Medical History:  Diagnosis Date  . Acute duodenitis 04/24/2017  . Allergy   . ANEMIA   . Anxiety   . BACK PAIN, LUMBAR   . Cancer of ascending colon pTispN0 s/p colectomy 03/05/2009 02/18/2010   Qualifier: Diagnosis of  By: Carlean Purl MD, Dimas Millin   . CHF (congestive heart failure) ([place])   . Complete rotator cuff tear of left shoulder 11/27/2013   Ultrasound guided injection on November 27, 2013   . COPD (chronic obstructive pulmonary disease) with emphysema ([place])   . COPD exacerbation ([place]) 11/14/2017  . Crohn's  02/2010   ileal ulcers, intol of entercort--refuses treatment  . Emphysema of lung ([place])   . GERD   . History of blood transfusion    w/ c/s surgery  . History of transfusion of whole blood   . HYPERLIPIDEMIA   . HYPERTENSION   .  Microscopic hematuria    chronic  . Myocardial infarction ([place]) 2010  . Obstruction of intestine or colon (HCC)    adhesions  . OSTEOARTHRITIS   . Rectal fissure    Possible fissure  . Rotator cuff tear   . Takotsubo syndrome 12/2008  . URINARY INCONTINENCE    Past Surgical History:  Procedure Laterality Date  . ABDOMINAL HYSTERECTOMY  1994  . APPENDECTOMY  1964  . BLADDER SUSPENSION    . CESAREAN SECTION     x 3  . CHOLECYSTECTOMY  1995  . COLONOSCOPY  2017  . COLONOSCOPY W/ BIOPSIES AND POLYPECTOMY  01/24/2011   (Crohn's)ileitis, internal hemorrhoids  . ECTOPIC PREGNANCY SURGERY    . ESOPHAGOGASTRODUODENOSCOPY    . FACIAL COSMETIC SURGERY    . HAND SURGERY Bilateral 1991   x 2  . KNEE SURGERY Right 1981  . LYSIS OF ADHESION  2010   ex lap/LOA for SBO Dr Excell Seltzer  . ORBITAL FRACTURE SURGERY  1990  . RIGHT COLECTOMY  03/2009   Right colectomy for colon CA.  Dr Donne Hazel  . TONSILLECTOMY  1952  . TOTAL KNEE ARTHROPLASTY  03/2010   Dr Alvan Dame.  Depuy  . UPPER GASTROINTESTINAL ENDOSCOPY  05/16/2010   normal  . UTERINE SUSPENSION    . VIDEO BRONCHOSCOPY Bilateral 12/18/2014   Procedure: VIDEO BRONCHOSCOPY WITHOUT FLUORO;  Surgeon: Tanda Rockers, MD;  Location: [facility];  Service: Cardiopulmonary;  Laterality: Bilateral;   Social History   Socioeconomic History  .  Marital status: Widowed    Spouse name: Not on file  . Number of children: 5  . Years of education: 88  . Highest education level: High school graduate  Occupational History  . Occupation: Retired     Fish farm manager: UNEMPLOYED  Tobacco Use  . Smoking status: Current Every Day Smoker    Packs/day: 1.00    Years: 53.00    Pack years: 53.00    Types: Cigarettes  . Smokeless tobacco: Never Used  Vaping Use  . Vaping Use: Never used  Substance and Sexual Activity  . Alcohol use: Yes    Comment: occasional mixed drink  . Drug use: [name]  . Sexual activity: Not Currently    Birth control/protection: Surgical     Comment: Hysterectomy  Other Topics Concern  . Not on file  Social History Narrative   Widowed; has five children, 8 grandchildren and 4 great-grandchildren.   Social Determinants of Health   Financial Resource Strain: Not on file  Food Insecurity: Not on file  Transportation Needs: Not on file  Physical Activity: Not on file  Stress: Not on file  Social Connections: Not on file   Allergies  Allergen Reactions  . Morphine Nausea And Vomiting   Family History  Problem Relation Age of Onset  . Colon cancer Father 81  . Hypertension Father   . Heart disease Father   . Kidney disease Father   . Colon cancer Sister 50  . Liver cancer Sister   . Other Sister        amyloidosis  . Throat cancer Mother   . Arthritis Other        Parent, other relative  . Stomach cancer Neg Hx   . Rectal cancer Neg Hx      Current Outpatient Medications (Cardiovascular):  .  metoprolol tartrate (LOPRESSOR) 25 MG tablet, Take 1 tablet by mouth twice daily .  rosuvastatin (CRESTOR) 10 MG tablet, Take 1 tablet by mouth five times a week Monday-Friday  Current Outpatient Medications (Respiratory):  .  albuterol (PROVENTIL) (2.5 MG/3ML) 0.083% nebulizer solution, Take 3 mLs (2.5 mg total) by nebulization every 6 (six) hours as needed for wheezing or shortness of breath. [name]  albuterol (VENTOLIN HFA) 108 (90 Base) MCG/ACT inhaler, INHALE 2 PUFFS BY MOUTH EVERY 4 HOURS AS NEEDED FOR WHEEZING OR SHORTNESS OF BREATH .  fluticasone (FLONASE) 50 MCG/ACT nasal spray, Place 2 sprays into both nostrils daily.  Current Outpatient Medications (Analgesics):  .  aspirin EC 81 MG tablet, Take 81 mg by mouth daily. [name]  HYDROcodone-acetaminophen (NORCO/VICODIN) 5-325 MG tablet, Take 1 tablet by mouth 2 (two) times daily as needed for moderate pain (pain).   Current Outpatient Medications (Other):  .  cyclobenzaprine (FLEXERIL) 10 MG tablet, Take 1 tablet by mouth three times daily as needed for muscle  spasm   Reviewed prior external information including notes and imaging from  primary care provider As well as notes that were available from care everywhere and other healthcare systems.  Past medical history, social, surgical and family history all reviewed in electronic medical record.  [name] pertanent information unless stated regarding to the chief complaint.   Review of Systems:  [name] headache, visual changes, nausea, vomiting, diarrhea, constipation, dizziness, abdominal pain, skin rash, fevers, chills, night sweats, weight loss, swollen lymph nodes, body aches, joint swelling, chest pain, shortness of breath, mood changes. POSITIVE muscle aches  Objective  Blood pressure 120/78, height 5' 1"  (1.549 m), weight 116 lb (52.6 kg).  General: [name] apparent distress alert and oriented x3 mood and affect normal, dressed appropriately.  HEENT: Pupils equal, extraocular movements intact  Respiratory: Patient's speak in full sentences and does not appear short of breath  Cardiovascular: [name] lower extremity edema, non tender, [name] erythema  Gait normal with good balance and coordination.  MSK: Arthritic changes of multiple joints. Neck exam does show that patient does have loss of lordosis.  Only has 5 degrees of extension of the neck.  Diffusely tender in the paraspinal musculature bilaterally.  Negative Spurling's though noted today which may be a mild improvement.  Decent strength of the upper extremities with 4 out of 5 but symmetric.    Impression and Recommendations:     The above documentation has been reviewed and is accurate and complete Erica Pulley, DO

## 2020-06-08 ENCOUNTER — Other Ambulatory Visit: Payer: Self-pay

## 2020-06-08 ENCOUNTER — Encounter: Payer: Self-pay | Admitting: Family Medicine

## 2020-06-08 ENCOUNTER — Ambulatory Visit (INDEPENDENT_AMBULATORY_CARE_PROVIDER_SITE_OTHER): Payer: Medicare Other | Admitting: Family Medicine

## 2020-06-08 VITALS — BP 120/78 | Ht 61.0 in | Wt 116.0 lb

## 2020-06-08 DIAGNOSIS — M5010 Cervical disc disorder with radiculopathy, unspecified cervical region: Secondary | ICD-10-CM | POA: Diagnosis not present

## 2020-06-08 DIAGNOSIS — K50012 Crohn's disease of small intestine with intestinal obstruction: Secondary | ICD-10-CM | POA: Diagnosis not present

## 2020-06-08 DIAGNOSIS — E278 Other specified disorders of adrenal gland: Secondary | ICD-10-CM | POA: Diagnosis not present

## 2020-06-08 MED ORDER — METHYLPREDNISOLONE ACETATE 40 MG/ML IJ SUSP
40.0000 mg | Freq: Once | INTRAMUSCULAR | Status: AC
  Administered 2020-06-08: 40 mg via INTRAMUSCULAR

## 2020-06-08 MED ORDER — KETOROLAC TROMETHAMINE 30 MG/ML IJ SOLN
30.0000 mg | Freq: Once | INTRAMUSCULAR | Status: AC
  Administered 2020-06-08: 30 mg via INTRAMUSCULAR

## 2020-06-08 NOTE — Assessment & Plan Note (Signed)
Patient has known arthritic changes of the neck noted.  MRI does show that there is edema noted of the facet joints of multiple areas.  Patient does have a broad central disc protrusion at C6-C7.  Patient wants to hold on any type of epidural.  Patient declined referral to neurosurgery.  Patient is on hydrocodone from primary care provider.  Given Toradol and Depo-Medrol for pain relief at this moment but do not see anything else that would be significantly helping when it comes to her pain otherwise.  If patient changes her mind on the epidural she can call.  Patient can follow-up with me in 28-monthintervals.

## 2020-06-08 NOTE — Patient Instructions (Signed)
Arthritis of the neck When pain gets worse write [place] for epidural Check in every 3-4 months Ask Dr. Sharlet Salina for CT of ab/pelvis

## 2020-06-08 NOTE — Assessment & Plan Note (Signed)
Patient has had some weight loss and is having difficulty gaining weight.  Encourage patient to ask primary care provider about the possibility of CT abdomen and pelvis with it now being greater than a year and a half since she has had any imaging.  History of cancer of the ascending colon but seem to be more secondary to potential Crohn's.  Patient does not feel like she is having a flare of Crohn's.

## 2020-06-14 ENCOUNTER — Telehealth: Payer: Self-pay | Admitting: Internal Medicine

## 2020-06-14 MED ORDER — HYDROCODONE-ACETAMINOPHEN 5-325 MG PO TABS: 1.0000 | ORAL_TABLET | Freq: Two times a day (BID) | ORAL | 0 refills | Status: DC | PRN

## 2020-06-14 NOTE — Telephone Encounter (Signed)
Patient requesting refill for HYDROcodone-acetaminophen (NORCO/VICODIN) 5-325 MG tablet  [place], [name]

## 2020-06-14 NOTE — Telephone Encounter (Signed)
See below

## 2020-06-15 ENCOUNTER — Ambulatory Visit: Payer: Medicare Other | Admitting: Physical Therapy

## 2020-06-16 DIAGNOSIS — F99 Mental disorder, not otherwise specified: Secondary | ICD-10-CM | POA: Diagnosis not present

## 2020-06-22 ENCOUNTER — Telehealth: Payer: Self-pay | Admitting: Family Medicine

## 2020-06-22 ENCOUNTER — Other Ambulatory Visit: Payer: Self-pay | Admitting: *Deleted

## 2020-06-22 ENCOUNTER — Other Ambulatory Visit: Payer: Self-pay

## 2020-06-22 DIAGNOSIS — F17210 Nicotine dependence, cigarettes, uncomplicated: Secondary | ICD-10-CM

## 2020-06-22 DIAGNOSIS — M5412 Radiculopathy, cervical region: Secondary | ICD-10-CM

## 2020-06-22 DIAGNOSIS — Z87891 Personal history of nicotine dependence: Secondary | ICD-10-CM

## 2020-06-22 NOTE — Telephone Encounter (Signed)
Epidural placed and patient notified.

## 2020-06-22 NOTE — Telephone Encounter (Signed)
Patient called asking if an order for an epidural could be sent in for her. She said that she is looking to schedule it around April 8th.  Please advise.

## 2020-06-22 NOTE — Telephone Encounter (Signed)
I am fine with it if it is the neck

## 2020-06-23 DIAGNOSIS — F99 Mental disorder, not otherwise specified: Secondary | ICD-10-CM | POA: Diagnosis not present

## 2020-06-29 ENCOUNTER — Ambulatory Visit: Payer: Medicare Other | Admitting: Family Medicine

## 2020-06-30 DIAGNOSIS — F99 Mental disorder, not otherwise specified: Secondary | ICD-10-CM | POA: Diagnosis not present

## 2020-07-02 ENCOUNTER — Other Ambulatory Visit: Payer: Self-pay | Admitting: Internal Medicine

## 2020-07-05 ENCOUNTER — Inpatient Hospital Stay
Admission: RE | Admit: 2020-07-05 | Discharge: 2020-07-05 | Disposition: A | Discharge status: Home / Self Care | Payer: Medicare Other | Source: Ambulatory Visit | Attending: Family Medicine | Admitting: Family Medicine

## 2020-07-05 ENCOUNTER — Ambulatory Visit
Admission: RE | Admit: 2020-07-05 | Discharge: 2020-07-05 | Disposition: A | Discharge status: Home / Self Care | Payer: Medicare Other | Source: Ambulatory Visit | Attending: Family Medicine | Admitting: Family Medicine

## 2020-07-05 DIAGNOSIS — M50222 Other cervical disc displacement at C5-C6 level: Secondary | ICD-10-CM | POA: Diagnosis not present

## 2020-07-05 DIAGNOSIS — M50223 Other cervical disc displacement at C6-C7 level: Secondary | ICD-10-CM | POA: Diagnosis not present

## 2020-07-05 DIAGNOSIS — M5412 Radiculopathy, cervical region: Secondary | ICD-10-CM

## 2020-07-05 MED ORDER — IOPAMIDOL (ISOVUE-M 300) INJECTION 61%
1.0000 mL | Freq: Once | INTRAMUSCULAR | Status: AC
  Administered 2020-07-05: 1 mL via EPIDURAL

## 2020-07-05 MED ORDER — TRIAMCINOLONE ACETONIDE 40 MG/ML IJ SUSP (RADIOLOGY)
60.0000 mg | Freq: Once | INTRAMUSCULAR | Status: AC
  Administered 2020-07-05: 60 mg via EPIDURAL

## 2020-07-05 NOTE — Discharge Instructions (Signed)

## 2020-07-07 DIAGNOSIS — F99 Mental disorder, not otherwise specified: Secondary | ICD-10-CM | POA: Diagnosis not present

## 2020-07-13 ENCOUNTER — Telehealth: Payer: Self-pay | Admitting: Internal Medicine

## 2020-07-13 NOTE — Telephone Encounter (Signed)
See below

## 2020-07-13 NOTE — Telephone Encounter (Signed)
HYDROcodone-acetaminophen (NORCO/VICODIN) 5-325 MG tablet [place], [place] [address] Phone:  [phone]  Fax:  [phone]     Last seen-02.23.22 Next apt- 05.27.22

## 2020-07-14 ENCOUNTER — Telehealth: Payer: Self-pay | Admitting: Internal Medicine

## 2020-07-14 MED ORDER — HYDROCODONE-ACETAMINOPHEN 5-325 MG PO TABS: 1.0000 | ORAL_TABLET | Freq: Two times a day (BID) | ORAL | 0 refills | Status: DC | PRN

## 2020-07-14 NOTE — Telephone Encounter (Signed)
[place] wants to know why the patient is taking HYDROcodone-acetaminophen (NORCO/VICODIN) 5-325 MG tablet   (a diagnosis)

## 2020-07-14 NOTE — Telephone Encounter (Signed)
Chronic low back pain

## 2020-07-14 NOTE — Telephone Encounter (Signed)
See below

## 2020-07-15 NOTE — Telephone Encounter (Signed)
Spoke with [facility] to give the diagnosis code. No other questions or concerns.

## 2020-07-28 DIAGNOSIS — F99 Mental disorder, not otherwise specified: Secondary | ICD-10-CM | POA: Diagnosis not present

## 2020-08-04 DIAGNOSIS — F99 Mental disorder, not otherwise specified: Secondary | ICD-10-CM | POA: Diagnosis not present

## 2020-08-08 NOTE — Progress Notes (Signed)
Cardiology Office Note:    Date:  08/09/2020   ID:  Erica [name], DOB 12/17/1950, MRN [id number]  PCP:  Crawford, Elizabeth A, MD  Cardiologist:   W  III, MD   Referring MD: Crawford, Elizabeth A, *   Chief Complaint  Patient presents with  . Coronary Artery Disease    History of Present Illness:    Erica [name] is a 69 y.o. female with a hx of stress-induced cardiomyopathy 2010, heavy tobacco use , hyperlipidemia, hypertension, an history of chronic diastolic heart failure.  She is not having angina.  She denies chest pain.  Does dyspnea on exertion.  Still smokes cigarettes.  Wants to get off of both Crestor and metoprolol.  No real reason other than she is tired of taking it.  No recent laboratory data.  Last  lipid panel was excellent.  Past Medical History:  Diagnosis Date  . Acute duodenitis 04/24/2017  . Allergy   . ANEMIA   . Anxiety   . BACK PAIN, LUMBAR   . Cancer of ascending colon pTispN0 s/p colectomy 03/05/2009 02/18/2010   Qualifier: Diagnosis of  By: Gessner MD, FACG, Carl E   . CHF (congestive heart failure) (HCC)   . Complete rotator cuff tear of left shoulder 11/27/2013   Ultrasound guided injection on November 27, 2013   . COPD (chronic obstructive pulmonary disease) with emphysema (HCC)   . COPD exacerbation (HCC) 11/14/2017  . Crohn's  02/2010   ileal ulcers, intol of entercort--refuses treatment  . Emphysema of lung (HCC)   . GERD   . History of blood transfusion    w/ c/s surgery  . History of transfusion of whole blood   . HYPERLIPIDEMIA   . HYPERTENSION   . Microscopic hematuria    chronic  . Myocardial infarction (HCC) 2010  . Obstruction of intestine or colon (HCC)    adhesions  . OSTEOARTHRITIS   . Rectal fissure    Possible fissure  . Rotator cuff tear   . Takotsubo syndrome 12/2008  . URINARY INCONTINENCE     Past Surgical History:  Procedure Laterality Date  . ABDOMINAL HYSTERECTOMY  1994  . APPENDECTOMY  1964   . BLADDER SUSPENSION    . CESAREAN SECTION     x 3  . CHOLECYSTECTOMY  1995  . COLONOSCOPY  2017  . COLONOSCOPY W/ BIOPSIES AND POLYPECTOMY  01/24/2011   (Crohn's)ileitis, internal hemorrhoids  . ECTOPIC PREGNANCY SURGERY    . ESOPHAGOGASTRODUODENOSCOPY    . FACIAL COSMETIC SURGERY    . HAND SURGERY Bilateral 1991   x 2  . KNEE SURGERY Right 1981  . LYSIS OF ADHESION  2010   ex lap/LOA for SBO Dr Hoxworth  . ORBITAL FRACTURE SURGERY  1990  . RIGHT COLECTOMY  03/2009   Right colectomy for colon CA.  Dr Wakefield  . TONSILLECTOMY  1952  . TOTAL KNEE ARTHROPLASTY  03/2010   Dr Olin.  Depuy  . UPPER GASTROINTESTINAL ENDOSCOPY  05/16/2010   normal  . UTERINE SUSPENSION    . VIDEO BRONCHOSCOPY Bilateral 12/18/2014   Procedure: VIDEO BRONCHOSCOPY WITHOUT FLUORO;  Surgeon: Michael B Wert, MD;  Location: [facility];  Service: Cardiopulmonary;  Laterality: Bilateral;    Current Medications: Current Meds  Medication Sig  . albuterol (PROVENTIL) (2.5 MG/3ML) 0.083% nebulizer solution Take 3 mLs (2.5 mg total) by nebulization every 6 (six) hours as needed for wheezing or shortness of breath.  . albuterol (VENTOLIN HFA) 108 (90 Base) MCG/ACT inhaler   INHALE 2 PUFFS BY MOUTH EVERY 4 HOURS AS NEEDED FOR WHEEZING FOR SHORTNESS OF BREATH  . aspirin EC 81 MG tablet Take 81 mg by mouth daily.  . cyclobenzaprine (FLEXERIL) 10 MG tablet Take 1 tablet by mouth three times daily as needed for muscle spasm  . fluticasone (FLONASE) 50 MCG/ACT nasal spray Place 2 sprays into both nostrils daily.  [name] HYDROcodone-acetaminophen (NORCO/VICODIN) 5-325 MG tablet Take 1 tablet by mouth 2 (two) times daily as needed for moderate pain (pain).  . metoprolol tartrate (LOPRESSOR) 25 MG tablet Take 1 tablet by mouth twice daily  . rosuvastatin (CRESTOR) 10 MG tablet Take 1 tablet by mouth five times a week Monday-Friday     Allergies:   Morphine   Social History   Socioeconomic History  . Marital status:  Widowed    Spouse name: Not on file  . Number of children: 5  . Years of education: 4  . Highest education level: High school graduate  Occupational History  . Occupation: Retired     Fish farm manager: UNEMPLOYED  Tobacco Use  . Smoking status: Current Every Day Smoker    Packs/day: 1.00    Years: 53.00    Pack years: 53.00    Types: Cigarettes  . Smokeless tobacco: Never Used  Vaping Use  . Vaping Use: Never used  Substance and Sexual Activity  . Alcohol use: Yes    Comment: occasional mixed drink  . Drug use: No  . Sexual activity: Not Currently    Birth control/protection: Surgical    Comment: Hysterectomy  Other Topics Concern  . Not on file  Social History Narrative   Widowed; has five children, 8 grandchildren and 4 great-grandchildren.   Social Determinants of Health   Financial Resource Strain: Not on file  Food Insecurity: Not on file  Transportation Needs: Not on file  Physical Activity: Not on file  Stress: Not on file  Social Connections: Not on file     Family History: The patient's family history includes Arthritis in an other family member; Colon cancer (age of onset: 66) in her sister; Colon cancer (age of onset: 73) in her father; Heart disease in her father; Hypertension in her father; Kidney disease in her father; Liver cancer in her sister; Other in her sister; Throat cancer in her mother. There is no history of Stomach cancer or Rectal cancer.  ROS:   Please see the history of present illness.    Weight is stable compared to last year.  Appetite is been poor and constituents of diet also unhealthy.  Continues to smoke cigarettes.  All other systems reviewed and are negative.  EKGs/Labs/Other Studies Reviewed:    The following studies were reviewed today: No new data.  Reviewed the most recent cancer screening CT that demonstrated aortic atherosclerosis and three-vessel coronary calcification.  EKG:  EKG normal sinus rhythm, low voltage, left axis  deviation, poor R wave progression, and when compared to 07/09/2019, no changes occurred.  Voltage is slightly lower on today's tracing.  Recent Labs: 09/18/2019: TSH 0.64 05/04/2020: ALT 10; BUN 12; Creatinine, Ser 0.67; Hemoglobin 14.2; Platelets 274.0; Potassium 4.3; Sodium 141  Recent Lipid Panel    Component Value Date/Time   CHOL 138 10/10/2019 0802   TRIG 133 10/10/2019 0802   HDL 58 10/10/2019 0802   CHOLHDL 2.4 10/10/2019 0802   CHOLHDL 3 12/05/2018 0805   VLDL 36.2 12/05/2018 0805   LDLCALC 57 10/10/2019 0802   LDLDIRECT 138.5 09/30/2012 1518  Physical Exam:    VS:  BP 122/84   Pulse 84   Ht 5' 1" (1.549 m)   Wt 116 lb (52.6 kg)   LMP  (LMP Unknown)   SpO2 93%   BMI 21.92 kg/m     Wt Readings from Last 3 Encounters:  08/09/20 116 lb (52.6 kg)  06/08/20 116 lb (52.6 kg)  05/26/20 115 lb (52.2 kg)     GEN: Slender and frail appearing. No acute distress HEENT: Normal NECK: No JVD. LYMPHATICS: No lymphadenopathy CARDIAC: No murmur. RRR no gallop, or edema. VASCULAR:  Normal Pulses. No bruits. RESPIRATORY:  Clear to auscultation without rales, wheezing or rhonchi  ABDOMEN: Soft, non-tender, non-distended, No pulsatile mass, MUSCULOSKELETAL: No deformity  SKIN: Warm and dry NEUROLOGIC:  Alert and oriented x 3 PSYCHIATRIC:  Normal affect   ASSESSMENT:    1. Coronary artery calcification seen on CAT scan   2. Takotsubo syndrome   3. Chronic diastolic HF (heart failure) (HCC)   4. Mixed hyperlipidemia   5. Essential hypertension   6. Tobacco abuse    PLAN:    In order of problems listed above:  1. Continue secondary prevention.  Discussed smoking cessation.  She is unable to commit.  I convinced her not to stop statin therapy.  Check lipid panel and c-Met and the next several days but it needs to be done fasting. 2. No recurrence 3. No evidence of volume overload 4. Continue rosuvastatin 10 mg/day 5. Blood pressure is excellent.  I would not advise  stopping metoprolol 6. Asked her to stop smoking but she cannot comply.  Overall education and awareness concerning primary/secondary risk prevention was discussed in detail: LDL less than 70, hemoglobin A1c less than 7, blood pressure target less than 130/80 mmHg, >150 minutes of moderate aerobic activity per week, avoidance of smoking, weight control (via diet and exercise), and continued surveillance/management of/for obstructive sleep apnea.    Medication Adjustments/Labs and Tests Ordered: Current medicines are reviewed at length with the patient today.  Concerns regarding medicines are outlined above.  Orders Placed This Encounter  Procedures  . EKG 12-Lead   No orders of the defined types were placed in this encounter.   There are no Patient Instructions on file for this visit.   Signed,  W  III, MD  08/09/2020 3:53 PM    [facility] 

## 2020-08-09 ENCOUNTER — Ambulatory Visit
Admission: RE | Admit: 2020-08-09 | Discharge: 2020-08-09 | Disposition: A | Discharge status: Home / Self Care | Payer: Medicare Other | Source: Ambulatory Visit | Attending: Acute Care | Admitting: Acute Care

## 2020-08-09 ENCOUNTER — Other Ambulatory Visit: Payer: Self-pay | Admitting: Internal Medicine

## 2020-08-09 ENCOUNTER — Other Ambulatory Visit: Payer: Self-pay

## 2020-08-09 ENCOUNTER — Ambulatory Visit (INDEPENDENT_AMBULATORY_CARE_PROVIDER_SITE_OTHER): Payer: Medicare Other | Admitting: Interventional Cardiology

## 2020-08-09 ENCOUNTER — Encounter: Payer: Self-pay | Admitting: Interventional Cardiology

## 2020-08-09 VITALS — BP 122/84 | HR 84 | Ht 61.0 in | Wt 116.0 lb

## 2020-08-09 DIAGNOSIS — Z720 Tobacco use: Secondary | ICD-10-CM

## 2020-08-09 DIAGNOSIS — E782 Mixed hyperlipidemia: Secondary | ICD-10-CM | POA: Diagnosis not present

## 2020-08-09 DIAGNOSIS — F17210 Nicotine dependence, cigarettes, uncomplicated: Secondary | ICD-10-CM | POA: Diagnosis not present

## 2020-08-09 DIAGNOSIS — I2510 Atherosclerotic heart disease of native coronary artery without angina pectoris: Secondary | ICD-10-CM | POA: Diagnosis not present

## 2020-08-09 DIAGNOSIS — I10 Essential (primary) hypertension: Secondary | ICD-10-CM

## 2020-08-09 DIAGNOSIS — I5181 Takotsubo syndrome: Secondary | ICD-10-CM | POA: Diagnosis not present

## 2020-08-09 DIAGNOSIS — I5032 Chronic diastolic (congestive) heart failure: Secondary | ICD-10-CM | POA: Diagnosis not present

## 2020-08-09 DIAGNOSIS — Z87891 Personal history of nicotine dependence: Secondary | ICD-10-CM

## 2020-08-09 NOTE — Patient Instructions (Signed)
Medication Instructions:  Your physician recommends that you continue on your current medications as directed. Please refer to the Current Medication list given to you today.  *If you need a refill on your cardiac medications before your next appointment, please call your pharmacy*   Lab Work: BMET, Liver and Lipid sometime before July 9th.  You will need to be fasting for these labs (nothing to eat or drink after midnight except water and black coffee).  If you have labs (blood work) drawn today and your tests are completely normal, you will receive your results only by: [name] MyChart Message (if you have MyChart) OR . A paper copy in the mail If you have any lab test that is abnormal or we need to change your treatment, we will call you to review the results.   Testing/Procedures: None   Follow-Up: At [facility], you and your health needs are our priority.  As part of our continuing mission to provide you with exceptional heart care, we have created designated Provider Care Teams.  These Care Teams include your primary Cardiologist (physician) and Advanced Practice Providers (APPs -  Physician Assistants and Nurse Practitioners) who all work together to provide you with the care you need, when you need it.  We recommend signing up for the patient portal called "MyChart".  Sign up information is provided on this After Visit Summary.  MyChart is used to connect with patients for Virtual Visits (Telemedicine).  Patients are able to view lab/test results, encounter notes, upcoming appointments, etc.  Non-urgent messages can be sent to your provider as well.   To learn more about what you can do with MyChart, go to [email or web address].    Your next appointment:   1 year(s)  The format for your next appointment:   In Person  Provider:   You may see Sinclair Grooms, MD or one of the following Advanced Practice Providers on your designated Care Team:    Kathyrn Drown,  NP    Other Instructions

## 2020-08-10 ENCOUNTER — Telehealth: Payer: Self-pay | Admitting: Family Medicine

## 2020-08-10 NOTE — Telephone Encounter (Signed)
Left VM for patient to call back to schedule with Dr. Georgina Snell tomorrow afternoon.

## 2020-08-10 NOTE — Telephone Encounter (Signed)
It would be fine to get it again. Can Dr. Georgina Snell or myself if there is an opening that would be fine

## 2020-08-10 NOTE — Telephone Encounter (Signed)
Patient called stating that she is having a lot of pain. She asked if she would be able to get another injection here at our office (she did not know what it was but said the assistant gives it to her) or if it was too soon?  Please advise.

## 2020-08-11 DIAGNOSIS — F99 Mental disorder, not otherwise specified: Secondary | ICD-10-CM | POA: Diagnosis not present

## 2020-08-11 NOTE — Telephone Encounter (Signed)
Patient called back. She only wants to see Dr [name].  She is scheduled for June 8th but I have put her on the cancellation list.

## 2020-08-12 ENCOUNTER — Other Ambulatory Visit: Payer: Self-pay

## 2020-08-12 ENCOUNTER — Other Ambulatory Visit: Payer: Medicare Other

## 2020-08-12 DIAGNOSIS — I2510 Atherosclerotic heart disease of native coronary artery without angina pectoris: Secondary | ICD-10-CM | POA: Diagnosis not present

## 2020-08-12 DIAGNOSIS — E782 Mixed hyperlipidemia: Secondary | ICD-10-CM | POA: Diagnosis not present

## 2020-08-12 LAB — BASIC METABOLIC PANEL
BUN/Creatinine Ratio: 18 (ref 12–28)
BUN: 14 mg/dL (ref 8–27)
CO2: 27 mmol/L (ref 20–29)
Calcium: 9.8 mg/dL (ref 8.7–10.3)
Chloride: 101 mmol/L (ref 96–106)
Creatinine, Ser: 0.78 mg/dL (ref 0.57–1.00)
Glucose: 96 mg/dL (ref 65–99)
Potassium: 4.9 mmol/L (ref 3.5–5.2)
Sodium: 141 mmol/L (ref 134–144)
eGFR: 82 mL/min/{1.73_m2} (ref >59)

## 2020-08-12 LAB — HEPATIC FUNCTION PANEL
ALT: 25 IU/L (ref 0–32)
AST: 24 IU/L (ref 0–40)
Albumin: 4.6 g/dL (ref 3.8–4.8)
Alkaline Phosphatase: 70 IU/L (ref 44–121)
Bilirubin Total: 0.5 mg/dL (ref 0.0–1.2)
Bilirubin, Direct: 0.14 mg/dL (ref 0.00–0.40)
Total Protein: 6.5 g/dL (ref 6.0–8.5)

## 2020-08-12 LAB — LIPID PANEL
Chol/HDL Ratio: 3 ratio (ref 0.0–4.4)
Cholesterol, Total: 183 mg/dL (ref 100–199)
HDL: 62 mg/dL (ref >39)
LDL Chol Calc (NIH): 99 mg/dL (ref 0–99)
Triglycerides: 128 mg/dL (ref 0–149)
VLDL Cholesterol Cal: 22 mg/dL (ref 5–40)

## 2020-08-14 DIAGNOSIS — F99 Mental disorder, not otherwise specified: Secondary | ICD-10-CM | POA: Diagnosis not present

## 2020-08-16 ENCOUNTER — Other Ambulatory Visit: Payer: Self-pay | Admitting: Internal Medicine

## 2020-08-16 NOTE — Telephone Encounter (Signed)
1.Medication Requested: HYDROcodone-acetaminophen (NORCO/VICODIN) 5-325 MG tablet    2. Pharmacy (Name, Street, [place]): [place], [address]  3. On Med List: yes   4. Last Visit with PCP: 05-26-20  5. Next visit date with PCP: 08-27-20   Agent: Please be advised that RX refills may take up to 3 business days. We ask that you follow-up with your pharmacy.

## 2020-08-17 MED ORDER — HYDROCODONE-ACETAMINOPHEN 5-325 MG PO TABS: 1.0000 | ORAL_TABLET | Freq: Two times a day (BID) | ORAL | 0 refills | Status: DC | PRN

## 2020-08-17 NOTE — Progress Notes (Signed)
Please call patient and let them  know their  low dose Ct was read as a Lung RADS 2: nodules that are benign in appearance and behavior with a very low likelihood of becoming a clinically active cancer due to size or lack of growth. Recommendation per radiology is for a repeat LDCT in 12 months. .Please let them  know we will order and schedule their  annual screening scan for 08/2021. Please let them  know there was notation of CAD on their  scan.  Please remind the patient  that this is a non-gated exam therefore degree or severity of disease  cannot be determined. Please have them  follow up with their PCP regarding potential risk factor modification, dietary therapy or pharmacologic therapy if clinically indicated. Pt.  is  currently on statin therapy. Please place order for annual  screening scan for  08/2021 and fax results to PCP. Thanks so much.

## 2020-08-17 NOTE — Telephone Encounter (Signed)
Spoke to patient, Hydrocodone-Aceta medication request sent to pharmacy this morning by Dr Sharlet Salina. Pharmacy will contact her when ready.

## 2020-08-18 DIAGNOSIS — F99 Mental disorder, not otherwise specified: Secondary | ICD-10-CM | POA: Diagnosis not present

## 2020-08-19 ENCOUNTER — Encounter: Payer: Self-pay | Admitting: *Deleted

## 2020-08-19 DIAGNOSIS — F17210 Nicotine dependence, cigarettes, uncomplicated: Secondary | ICD-10-CM

## 2020-08-19 DIAGNOSIS — Z87891 Personal history of nicotine dependence: Secondary | ICD-10-CM

## 2020-08-25 DIAGNOSIS — F99 Mental disorder, not otherwise specified: Secondary | ICD-10-CM | POA: Diagnosis not present

## 2020-08-26 ENCOUNTER — Other Ambulatory Visit: Payer: Self-pay

## 2020-08-27 ENCOUNTER — Ambulatory Visit (INDEPENDENT_AMBULATORY_CARE_PROVIDER_SITE_OTHER): Payer: Medicare Other | Admitting: Internal Medicine

## 2020-08-27 ENCOUNTER — Encounter: Payer: Self-pay | Admitting: Internal Medicine

## 2020-08-27 DIAGNOSIS — J439 Emphysema, unspecified: Secondary | ICD-10-CM | POA: Diagnosis not present

## 2020-08-27 DIAGNOSIS — I2510 Atherosclerotic heart disease of native coronary artery without angina pectoris: Secondary | ICD-10-CM

## 2020-08-27 DIAGNOSIS — J410 Simple chronic bronchitis: Secondary | ICD-10-CM | POA: Diagnosis not present

## 2020-08-27 DIAGNOSIS — F1120 Opioid dependence, uncomplicated: Secondary | ICD-10-CM | POA: Diagnosis not present

## 2020-08-27 DIAGNOSIS — M5010 Cervical disc disorder with radiculopathy, unspecified cervical region: Secondary | ICD-10-CM

## 2020-08-27 NOTE — Assessment & Plan Note (Signed)
Refill hydrocodone when due. [place] narcotic database and last UDS reviewed and appropriate.

## 2020-08-27 NOTE — Assessment & Plan Note (Signed)
Refill hydrocodone BID when due. She is appropriate and up to date on follow up.

## 2020-08-27 NOTE — Progress Notes (Signed)
   Subjective:   Patient ID: Erica [name], female    DOB: [birth year], 70 y.o.   MRN: [id number]  HPI The patient is a 69 YO female coming in for follow up chronic back and neck pain (taking hydrocodone BID and this is still allowing her to be very active, no new injuries or worsening, denies overuse, not due for refill today), some concerns about recent cardiology visit discussed.   Review of Systems  Constitutional: Negative for activity change, appetite change, chills, fatigue, fever and unexpected weight change.  HENT: Negative.   Eyes: Negative.   Respiratory: Negative.   Cardiovascular: Negative.   Gastrointestinal: Negative.   Musculoskeletal: Positive for arthralgias, back pain, myalgias and neck pain. Negative for gait problem and joint swelling.  Skin: Negative.   Neurological: Negative.   Psychiatric/Behavioral: Negative.     Objective:  Physical Exam Constitutional:      Appearance: She is well-developed.  HENT:     Head: Normocephalic and atraumatic.  Cardiovascular:     Rate and Rhythm: Normal rate and regular rhythm.  Pulmonary:     Effort: Pulmonary effort is normal. No respiratory distress.     Breath sounds: Rhonchi present. No wheezing or rales.     Comments: Coughing some during visit, stable from baseline Abdominal:     General: Bowel sounds are normal. There is no distension.     Palpations: Abdomen is soft.     Tenderness: There is no abdominal tenderness. There is no rebound.  Musculoskeletal:        General: Tenderness present.     Cervical back: Normal range of motion.  Skin:    General: Skin is warm and dry.  Neurological:     Mental Status: She is alert and oriented to person, place, and time.     Coordination: Coordination normal.     Vitals:   08/27/20 0906  BP: 124/78  Pulse: (!) 56  Resp: 18  Temp: 98.4 F (36.9 C)  TempSrc: Oral  SpO2: 98%  Weight: 114 lb (51.7 kg)  Height: 5' 1"  (1.549 m)    This visit occurred during  the SARS-CoV-2 public health emergency.  Safety protocols were in place, including screening questions prior to the visit, additional usage of staff PPE, and extensive cleaning of exam room while observing appropriate contact time as indicated for disinfecting solutions.   Assessment & Plan:

## 2020-08-27 NOTE — Assessment & Plan Note (Signed)
Stable overall, using albuterol prn. Very active. Still smoking.

## 2020-08-27 NOTE — Patient Instructions (Signed)
We will continue to monitor the kidneys and liver every 3 months.

## 2020-08-27 NOTE — Assessment & Plan Note (Addendum)
Does not feel able to quit. Reminded about risk and harm and CV risk from smoking.

## 2020-09-01 DIAGNOSIS — F99 Mental disorder, not otherwise specified: Secondary | ICD-10-CM | POA: Diagnosis not present

## 2020-09-07 NOTE — Progress Notes (Signed)
[place] Phone: [phone] Subjective:   Erica [name], am serving as a scribe for Dr. Hulan [name]. This visit occurred during the SARS-CoV-2 public health emergency.  Safety protocols were in place, including screening questions prior to the visit, additional usage of staff PPE, and extensive cleaning of exam room while observing appropriate contact time as indicated for disinfecting solutions.   I'm seeing this patient by the request  of:  [name], MD  CC: cervical neck pain , low back pain   [id number]   06/08/2020 Patient has known arthritic changes of the neck noted.  MRI does show that there is edema noted of the facet joints of multiple areas.  Patient does have a broad central disc protrusion at C6-C7.  Patient wants to hold on any type of epidural.  Patient declined referral to neurosurgery.  Patient is on hydrocodone from primary care provider.  Given Toradol and Depo-Medrol for pain relief at this moment but do not see anything else that would be significantly helping when it comes to her pain otherwise.  If patient changes her mind on the epidural she can call.  Patient can follow-up with me in 53-monthintervals.  Patient has had some weight loss and is having difficulty gaining weight.  Encourage patient to ask primary care provider about the possibility of CT abdomen and pelvis with it now being greater than a year and a half since she has had any imaging.  History of cancer of the ascending colon but seem to be more secondary to potential Crohn's.  Patient does not feel like she is having a flare of Crohn's.  Date 09/08/2020 CDayton Sherris a 70y.o. female coming in with complaint of cervical spine pain. Cervical epidural 07/05/2020. Patient states that she had some family at her house and she was cooking and cleaning which caused her back pain to increase with radiating pain in L leg.  Last epidural on lumbar spine was December 2021.  Patient is in more pain recently.  Finding it difficult to even do daily activities secondary to the discomfort.  Also experiencing locking of middle finger, R hand.  Patient did have an injection nearly a year ago.  Visit was July 2021.  Recently did do a lot of cooking and feels like that has aggravated it.  Also did a lot of yard work.       Past Medical History:  Diagnosis Date  . Acute duodenitis 04/24/2017  . Allergy   . ANEMIA   . Anxiety   . BACK PAIN, LUMBAR   . Cancer of ascending colon [id number] s/p colectomy 03/05/2009 02/18/2010   Qualifier: Diagnosis of  By: GCarlean PurlMD, FDimas Millin  . CHF (congestive heart failure) ([facility]   . Complete rotator cuff tear of left shoulder 11/27/2013   Ultrasound guided injection on November 27, 2013   . COPD (chronic obstructive pulmonary disease) with emphysema ([facility]   . COPD exacerbation ([facility] 11/14/2017  . Crohn's  02/2010   ileal ulcers, intol of entercort--refuses treatment  . Emphysema of lung ([facility]   . GERD   . History of blood transfusion    w/ c/s surgery  . History of transfusion of whole blood   . HYPERLIPIDEMIA   . HYPERTENSION   . Microscopic hematuria    chronic  . Myocardial infarction ([facility] 2010  . Obstruction of intestine or colon (H[facility])    adhesions  .  OSTEOARTHRITIS   . Rectal fissure    Possible fissure  . Rotator cuff tear   . Takotsubo syndrome 12/2008  . URINARY INCONTINENCE    Past Surgical History:  Procedure Laterality Date  . ABDOMINAL HYSTERECTOMY  1994  . APPENDECTOMY  1964  . BLADDER SUSPENSION    . CESAREAN SECTION     x 3  . CHOLECYSTECTOMY  1995  . COLONOSCOPY  2017  . COLONOSCOPY W/ BIOPSIES AND POLYPECTOMY  01/24/2011   (Crohn's)ileitis, internal hemorrhoids  . ECTOPIC PREGNANCY SURGERY    . ESOPHAGOGASTRODUODENOSCOPY    . FACIAL COSMETIC SURGERY    . HAND SURGERY Bilateral 1991   x 2  . KNEE SURGERY Right 1981  . LYSIS OF ADHESION  2010    ex lap/LOA for SBO Dr Excell Seltzer  . ORBITAL FRACTURE SURGERY  1990  . RIGHT COLECTOMY  03/2009   Right colectomy for colon CA.  Dr Donne Hazel  . TONSILLECTOMY  1952  . TOTAL KNEE ARTHROPLASTY  03/2010   Dr Alvan Dame.  Depuy  . UPPER GASTROINTESTINAL ENDOSCOPY  05/16/2010   normal  . UTERINE SUSPENSION    . VIDEO BRONCHOSCOPY Bilateral 12/18/2014   Procedure: VIDEO BRONCHOSCOPY WITHOUT FLUORO;  Surgeon: Tanda Rockers, MD;  Location: [facility];  Service: Cardiopulmonary;  Laterality: Bilateral;   Social History   Socioeconomic History  . Marital status: Widowed    Spouse name: Not on file  . Number of children: 5  . Years of education: 28  . Highest education level: High school graduate  Occupational History  . Occupation: Retired     Fish farm manager: UNEMPLOYED  Tobacco Use  . Smoking status: Current Every Day Smoker    Packs/day: 1.00    Years: 53.00    Pack years: 53.00    Types: Cigarettes  . Smokeless tobacco: Never Used  Vaping Use  . Vaping Use: Never used  Substance and Sexual Activity  . Alcohol use: Yes    Comment: occasional mixed drink  . Drug use: [name]  . Sexual activity: Not Currently    Birth control/protection: Surgical    Comment: Hysterectomy  Other Topics Concern  . Not on file  Social History Narrative   Widowed; has five children, 8 grandchildren and 4 great-grandchildren.   Social Determinants of Health   Financial Resource Strain: Not on file  Food Insecurity: Not on file  Transportation Needs: Not on file  Physical Activity: Not on file  Stress: Not on file  Social Connections: Not on file   Allergies  Allergen Reactions  . Morphine Nausea And Vomiting   Family History  Problem Relation Age of Onset  . Colon cancer Father 58  . Hypertension Father   . Heart disease Father   . Kidney disease Father   . Colon cancer Sister 9  . Liver cancer Sister   . Other Sister        amyloidosis  . Throat cancer Mother   . Arthritis Other         Parent, other relative  . Stomach cancer Neg Hx   . Rectal cancer Neg Hx      Current Outpatient Medications (Cardiovascular):  .  metoprolol tartrate (LOPRESSOR) 25 MG tablet, Take 1 tablet by mouth twice daily .  rosuvastatin (CRESTOR) 10 MG tablet, Take 1 tablet by mouth five times a week Monday-Friday  Current Outpatient Medications (Respiratory):  .  albuterol (PROVENTIL) (2.5 MG/3ML) 0.083% nebulizer solution, Take 3 mLs (2.5 mg total) by nebulization every  6 (six) hours as needed for wheezing or shortness of breath. [name]  albuterol (VENTOLIN HFA) 108 (90 Base) MCG/ACT inhaler, INHALE 2 PUFFS BY MOUTH EVERY 4 HOURS AS NEEDED FOR WHEEZING FOR SHORTNESS OF BREATH .  fluticasone (FLONASE) 50 MCG/ACT nasal spray, Place 2 sprays into both nostrils daily.  Current Outpatient Medications (Analgesics):  .  aspirin EC 81 MG tablet, Take 81 mg by mouth daily. [name]  HYDROcodone-acetaminophen (NORCO/VICODIN) 5-325 MG tablet, Take 1 tablet by mouth 2 (two) times daily as needed for moderate pain (pain).   Current Outpatient Medications (Other):  .  cyclobenzaprine (FLEXERIL) 10 MG tablet, Take 1 tablet by mouth three times daily as needed for muscle spasm   Reviewed prior external information including notes and imaging from  primary care provider As well as notes that were available from care everywhere and other healthcare systems.  Past medical history, social, surgical and family history all reviewed in electronic medical record.  [name] pertanent information unless stated regarding to the chief complaint.   Review of Systems:  [name] headache, visual changes, nausea, vomiting, diarrhea, constipation, dizziness, abdominal pain, skin rash, fevers, chills, night sweats, weight loss, swollen lymph nodes,  joint swelling, chest pain, shortness of breath, mood changes. POSITIVE muscle aches, body aches  Objective  Blood pressure 120/82, pulse 74, height 5' 1"  (1.549 m), weight 115 lb (52.2 kg), SpO2 95  %.   General: [name] apparent distress alert and oriented x3 mood and affect normal, dressed appropriately.  Patient does have mild jaundice noted of the eyes. HEENT: Pupils equal, extraocular movements intact once again jaundice noted of the eyes more than usual.  Mild jaundice may be of the skin as well Respiratory: Patient's speak in full sentences and does not appear short of breath does have a cough which is at patient's baseline Cardiovascular: [name] lower extremity edema, non tender, [name] erythema  Gait normal with good balance and coordination.  MSK: Severe arthritic changes of multiple joints.  Patient does have tenderness in the back.  Patient is somewhat uncomfortable with sitting.  Seems to be neurovascularly intact.  Patient does have a trigger nodule noted at the A2 pulley of the middle finger.  Severely tender to palpation.  Triggering noted.  Patient is able to straighten it though.  Neurovascularly intact distally.   Procedure: Real-time Ultrasound Guided Injection of right middle flexor tendon sheath Device: GE Logiq Q7 Ultrasound guided injection is preferred based studies that show increased duration, increased effect, greater accuracy, decreased procedural pain, increased response rate, and decreased cost with ultrasound guided versus blind injection.  Verbal informed consent obtained.  Time-out conducted.  Noted [name] overlying erythema, induration, or other signs of local infection.  Skin prepped in a sterile fashion.  Local anesthesia: Topical Ethyl chloride.  With sterile technique and under real time ultrasound guidance: With a 25-gauge half inch needle injected with 0.5 cc of 0.5% Marcaine and 0.5 cc of Kenalog 40 mg/mL.  [name] blood loss. Completed without difficulty  Pain immediately improved suggesting accurate placement of the medication.  Advised to call if fevers/chills, erythema, induration, drainage, or persistent bleeding.  Impression: Technically successful ultrasound  guided injection.   Impression and Recommendations:     The above documentation has been reviewed and is accurate and complete Lyndal Pulley, DO

## 2020-09-08 ENCOUNTER — Other Ambulatory Visit: Payer: Self-pay

## 2020-09-08 ENCOUNTER — Ambulatory Visit (INDEPENDENT_AMBULATORY_CARE_PROVIDER_SITE_OTHER): Payer: Medicare Other | Admitting: Family Medicine

## 2020-09-08 ENCOUNTER — Ambulatory Visit: Payer: Self-pay

## 2020-09-08 ENCOUNTER — Encounter: Payer: Self-pay | Admitting: Family Medicine

## 2020-09-08 VITALS — BP 120/82 | HR 74 | Ht 61.0 in | Wt 115.0 lb

## 2020-09-08 DIAGNOSIS — R748 Abnormal levels of other serum enzymes: Secondary | ICD-10-CM

## 2020-09-08 DIAGNOSIS — R17 Unspecified jaundice: Secondary | ICD-10-CM | POA: Diagnosis not present

## 2020-09-08 DIAGNOSIS — M79641 Pain in right hand: Secondary | ICD-10-CM | POA: Diagnosis not present

## 2020-09-08 DIAGNOSIS — M5416 Radiculopathy, lumbar region: Secondary | ICD-10-CM | POA: Diagnosis not present

## 2020-09-08 DIAGNOSIS — I2510 Atherosclerotic heart disease of native coronary artery without angina pectoris: Secondary | ICD-10-CM | POA: Diagnosis not present

## 2020-09-08 DIAGNOSIS — M2550 Pain in unspecified joint: Secondary | ICD-10-CM

## 2020-09-08 DIAGNOSIS — M65331 Trigger finger, right middle finger: Secondary | ICD-10-CM | POA: Diagnosis not present

## 2020-09-08 LAB — CBC WITH DIFFERENTIAL/PLATELET
Basophils Absolute: 0.1 10*3/uL (ref 0.0–0.1)
Basophils Relative: 0.7 % (ref 0.0–3.0)
Eosinophils Absolute: 0.2 10*3/uL (ref 0.0–0.7)
Eosinophils Relative: 2 % (ref 0.0–5.0)
HCT: 40.8 % (ref 36.0–46.0)
Hemoglobin: 13.6 g/dL (ref 12.0–15.0)
Lymphocytes Relative: 40.1 % (ref 12.0–46.0)
Lymphs Abs: 3 10*3/uL (ref 0.7–4.0)
MCHC: 33.3 g/dL (ref 30.0–36.0)
MCV: 93.5 fl (ref 78.0–100.0)
Monocytes Absolute: 0.7 10*3/uL (ref 0.1–1.0)
Monocytes Relative: 8.8 % (ref 3.0–12.0)
Neutro Abs: 3.6 10*3/uL (ref 1.4–7.7)
Neutrophils Relative %: 48.4 % (ref 43.0–77.0)
Platelets: 239 10*3/uL (ref 150.0–400.0)
RBC: 4.37 Mil/uL (ref 3.87–5.11)
RDW: 13.3 % (ref 11.5–15.5)
WBC: 7.5 10*3/uL (ref 4.0–10.5)

## 2020-09-08 LAB — COMPREHENSIVE METABOLIC PANEL
ALT: 18 U/L (ref 0–35)
AST: 20 U/L (ref 0–37)
Albumin: 4.2 g/dL (ref 3.5–5.2)
Alkaline Phosphatase: 51 U/L (ref 39–117)
BUN: 13 mg/dL (ref 6–23)
CO2: 31 mEq/L (ref 19–32)
Calcium: 9.7 mg/dL (ref 8.4–10.5)
Chloride: 103 mEq/L (ref 96–112)
Creatinine, Ser: 0.75 mg/dL (ref 0.40–1.20)
GFR: 80.63 mL/min (ref >60.00)
Glucose, Bld: 95 mg/dL (ref 70–99)
Potassium: 4.5 mEq/L (ref 3.5–5.1)
Sodium: 140 mEq/L (ref 135–145)
Total Bilirubin: 0.4 mg/dL (ref 0.2–1.2)
Total Protein: 6.6 g/dL (ref 6.0–8.3)

## 2020-09-08 LAB — PROTIME-INR
INR: 1 ratio (ref 0.8–1.0)
Prothrombin Time: 11 s (ref 9.6–13.1)

## 2020-09-08 LAB — SEDIMENTATION RATE: Sed Rate: 7 mm/hr (ref 0–30)

## 2020-09-08 MED ORDER — KETOROLAC TROMETHAMINE 30 MG/ML IJ SOLN
30.0000 mg | Freq: Once | INTRAMUSCULAR | Status: AC
  Administered 2020-09-08: 30 mg via INTRAMUSCULAR

## 2020-09-08 MED ORDER — METHYLPREDNISOLONE ACETATE 40 MG/ML IJ SUSP
40.0000 mg | Freq: Once | INTRAMUSCULAR | Status: AC
  Administered 2020-09-08: 40 mg via INTRAMUSCULAR

## 2020-09-08 NOTE — Assessment & Plan Note (Signed)
Worsening symptoms.  We will get patient to have another epidural.  Patient has responded previously to this and hopefully this will make significant difference in this.  Discussed icing regimen and home exercises, patient will continue to try to stay active on her own accord no change in medications but was given Toradol and Depo-Medrol today.  Secondary to the increasing jaundice we will get laboratory work-up to check of liver.  Patient does have follow-up with her primary care in the near future.

## 2020-09-08 NOTE — Assessment & Plan Note (Signed)
Repeat injection given today, tolerated the procedure well.  No significant difficulty.  Discussed icing regimen and home exercises, discussed avoiding certain activities and repetitive nature.  Patient did respond well to the injection previously and hopefully will continue to do well with conservative therapy.  Follow-up again 6 weeks

## 2020-09-08 NOTE — Patient Instructions (Addendum)
Labs today  Injected trigger finger [facility] [phone] for epidural See me again in 4 weeks after epidural

## 2020-09-08 NOTE — Assessment & Plan Note (Signed)
Noted today.  We will check labs including liver function.  History of Crohn's disease.  Recently did have labs checked by cardiologist and were in normal range 3 weeks ago but they are minorly elevated from patient's baseline

## 2020-09-09 ENCOUNTER — Telehealth: Payer: Self-pay | Admitting: Internal Medicine

## 2020-09-09 NOTE — Telephone Encounter (Signed)
See below

## 2020-09-09 NOTE — Telephone Encounter (Signed)
Patient is requesting an order be placed for imagining of the abdomin. She said that she has been losing wright and having some stomach issues. Her last OV was 5/27. She said that she when seeing Dr. [name] she discussed it with him and he told her to contact her PCP. Please advise

## 2020-09-09 NOTE — Telephone Encounter (Signed)
What kind of stomach issues? We will need more information.

## 2020-09-10 NOTE — Telephone Encounter (Signed)
Called patient and left a voicemail asking her to return my call here at the office to discuss. Office number was provided.

## 2020-09-14 ENCOUNTER — Telehealth: Payer: Self-pay | Admitting: Internal Medicine

## 2020-09-14 NOTE — Telephone Encounter (Signed)
1.Medication Requested: HYDROcodone-acetaminophen (NORCO/VICODIN) 5-325 MG tablet   2. Pharmacy (Name, Street, [place]): [place], [place] [address]  3. On Med List: yes   4. Last Visit with PCP: 08-27-20  5. Next visit date with PCP: 11-29-20   Agent: Please be advised that RX refills may take up to 3 business days. We ask that you follow-up with your pharmacy.

## 2020-09-14 NOTE — Telephone Encounter (Signed)
Last RF per controlled substance database: 08/17/20 Last OV 08/27/20 Next OV: 11/29/20

## 2020-09-15 DIAGNOSIS — F99 Mental disorder, not otherwise specified: Secondary | ICD-10-CM | POA: Diagnosis not present

## 2020-09-15 MED ORDER — HYDROCODONE-ACETAMINOPHEN 5-325 MG PO TABS: 1.0000 | ORAL_TABLET | Freq: Two times a day (BID) | ORAL | 0 refills | Status: DC | PRN

## 2020-09-15 NOTE — Addendum Note (Signed)
Addended by: Pricilla Holm A on: 09/15/2020 09:39 AM   Modules accepted: Orders

## 2020-09-15 NOTE — Telephone Encounter (Signed)
I have no idea, this is a pain medication they typically do not require codes.

## 2020-09-15 NOTE — Telephone Encounter (Signed)
Pt states pharmacy informed her that a code missing from prescription; that pharmacy sent [place] notification, however, unable to locate it electronically or via fax.

## 2020-09-16 ENCOUNTER — Other Ambulatory Visit: Payer: Self-pay

## 2020-09-16 ENCOUNTER — Telehealth: Payer: Self-pay | Admitting: Internal Medicine

## 2020-09-16 MED ORDER — ALBUTEROL SULFATE HFA 108 (90 BASE) MCG/ACT IN AERS: INHALATION_SPRAY | RESPIRATORY_TRACT | 0 refills | Status: DC

## 2020-09-16 NOTE — Telephone Encounter (Signed)
See phone note from 09/16/20

## 2020-09-16 NOTE — Telephone Encounter (Signed)
Venolin HFA reordered.

## 2020-09-16 NOTE — Telephone Encounter (Signed)
Erica [name] from [facility] called  Need a diagnostic code for the order HYDROcodone-acetaminophen (NORCO/VICODIN) 5-325 MG tablet  Please advise.   Best contact #: [phone]

## 2020-09-16 NOTE — Telephone Encounter (Signed)
Patient called again

## 2020-09-16 NOTE — Telephone Encounter (Signed)
   Please call pharmacy to clarify prescription for inhaler. There seems to be some confusion on ventolin versus proventil

## 2020-09-16 NOTE — Telephone Encounter (Signed)
Patient called and said that the pharmacy is needing a diagnosis code for her pain medication. Please advise

## 2020-09-16 NOTE — Telephone Encounter (Signed)
Diagnosis code of M50.10 given to pharmacist.  Pt notified that diagnosis code given to pharmacist.

## 2020-09-16 NOTE — Telephone Encounter (Addendum)
   Patient calling for status of refills, please call pharmacy, ask for [facility]

## 2020-09-16 NOTE — Telephone Encounter (Signed)
M50.10

## 2020-09-16 NOTE — Telephone Encounter (Signed)
1.Medication Requested: albuterol (VENTOLIN HFA) 108 (90 Base) MCG/ACT inhaler  2. Pharmacy (Name, Street, [place]): [place], [place] [address]  3. On Med List: yes   4. Last Visit with PCP: 08-27-20  5. Next visit date with PCP: 11-29-20   Agent: Please be advised that RX refills may take up to 3 business days. We ask that you follow-up with your pharmacy.

## 2020-09-17 NOTE — Telephone Encounter (Signed)
Patient called and said that her copay for the inhaler has increased to $40. She said that it was $24. She said that she did not pick up the prescription. She said that she is needing the generic. Patient is requesting a call back. Please advise

## 2020-09-20 NOTE — Telephone Encounter (Signed)
Left message for patient today. She can use the good rx card and we can send it to :  [facility]  $20.11  [place] $20.11  [facility] $23.23  She can let us know which pharmacy she wants [place] to send it to and she can use the good rx card to pick up script at discount price. She will need to pay for it out of pocket.

## 2020-09-24 ENCOUNTER — Ambulatory Visit
Admission: RE | Admit: 2020-09-24 | Discharge: 2020-09-24 | Disposition: A | Discharge status: Home / Self Care | Payer: Medicare Other | Source: Ambulatory Visit | Attending: Family Medicine | Admitting: Family Medicine

## 2020-09-24 DIAGNOSIS — M5416 Radiculopathy, lumbar region: Secondary | ICD-10-CM

## 2020-09-24 DIAGNOSIS — M5116 Intervertebral disc disorders with radiculopathy, lumbar region: Secondary | ICD-10-CM | POA: Diagnosis not present

## 2020-09-24 MED ORDER — IOPAMIDOL (ISOVUE-M 200) INJECTION 41%
1.0000 mL | Freq: Once | INTRAMUSCULAR | Status: AC
  Administered 2020-09-24: 1 mL via EPIDURAL

## 2020-09-24 MED ORDER — METHYLPREDNISOLONE ACETATE 40 MG/ML INJ SUSP (RADIOLOG
80.0000 mg | Freq: Once | INTRAMUSCULAR | Status: AC
  Administered 2020-09-24: 80 mg via EPIDURAL

## 2020-09-24 NOTE — Discharge Instructions (Signed)

## 2020-09-29 ENCOUNTER — Telehealth: Payer: Self-pay | Admitting: Internal Medicine

## 2020-09-29 ENCOUNTER — Encounter: Payer: Self-pay | Admitting: Internal Medicine

## 2020-09-29 ENCOUNTER — Other Ambulatory Visit: Payer: Self-pay

## 2020-09-29 ENCOUNTER — Ambulatory Visit (INDEPENDENT_AMBULATORY_CARE_PROVIDER_SITE_OTHER): Payer: Medicare Other | Admitting: Internal Medicine

## 2020-09-29 DIAGNOSIS — J439 Emphysema, unspecified: Secondary | ICD-10-CM

## 2020-09-29 DIAGNOSIS — I2510 Atherosclerotic heart disease of native coronary artery without angina pectoris: Secondary | ICD-10-CM | POA: Diagnosis not present

## 2020-09-29 DIAGNOSIS — F99 Mental disorder, not otherwise specified: Secondary | ICD-10-CM | POA: Diagnosis not present

## 2020-09-29 MED ORDER — ALBUTEROL SULFATE (2.5 MG/3ML) 0.083% IN NEBU: 2.5000 mg | INHALATION_SOLUTION | Freq: Four times a day (QID) | RESPIRATORY_TRACT | 1 refills | Status: DC | PRN

## 2020-09-29 MED ORDER — ALBUTEROL SULFATE HFA 108 (90 BASE) MCG/ACT IN AERS: INHALATION_SPRAY | RESPIRATORY_TRACT | 11 refills | Status: DC

## 2020-09-29 NOTE — Telephone Encounter (Signed)
[phone]- Drai , [place] requesting a call back to discuss the patients inhaler

## 2020-09-29 NOTE — Progress Notes (Signed)
   Subjective:   Patient ID: Erica [name], female    DOB: 1950/04/23, 70 y.o.   MRN: [id number]  Medication Refill Associated symptoms include arthralgias, coughing and neck pain. Pertinent negatives include no abdominal pain, chest pain, nausea or vomiting.  The patient is a 70 YO female coming in for concerns with recent medication communication problems with her pharmacy. She does need refill of proventil nebulizer solution and had some problems getting her albuterol inhaler. Denies current flare. Some chronic SOB still smoking overall stable cough stable.   Review of Systems  Constitutional: Negative.   HENT: Negative.    Eyes: Negative.   Respiratory:  Positive for cough and shortness of breath. Negative for chest tightness.   Cardiovascular:  Negative for chest pain, palpitations and leg swelling.  Gastrointestinal:  Negative for abdominal distention, abdominal pain, constipation, diarrhea, nausea and vomiting.  Musculoskeletal:  Positive for arthralgias, back pain and neck pain.  Skin: Negative.   Neurological: Negative.   Psychiatric/Behavioral: Negative.     Objective:  Physical Exam Constitutional:      Appearance: She is well-developed.     Comments: thin  HENT:     Head: Normocephalic and atraumatic.  Cardiovascular:     Rate and Rhythm: Normal rate and regular rhythm.  Pulmonary:     Effort: Pulmonary effort is normal. No respiratory distress.     Breath sounds: Wheezing present. No rales.     Comments: Lung exam stable from prior Abdominal:     General: Bowel sounds are normal. There is no distension.     Palpations: Abdomen is soft.     Tenderness: There is no abdominal tenderness. There is no rebound.  Musculoskeletal:        General: Tenderness present.     Cervical back: Normal range of motion.  Skin:    General: Skin is warm and dry.  Neurological:     Mental Status: She is alert and oriented to person, place, and time.     Coordination: Coordination  normal.    Vitals:   09/29/20 0937  BP: 122/70  Pulse: 78  Resp: 18  Temp: 98.5 F (36.9 C)  TempSrc: Oral  SpO2: 95%  Weight: 112 lb 3.2 oz (50.9 kg)  Height: 5' 1"  (1.549 m)    This visit occurred during the SARS-CoV-2 public health emergency.  Safety protocols were in place, including screening questions prior to the visit, additional usage of staff PPE, and extensive cleaning of exam room while observing appropriate contact time as indicated for disinfecting solutions.   Assessment & Plan:  Visit time 30 minutes in face to face communication with patient and coordination of care, additional 5 minutes spent in record review, coordination or care, ordering tests, communicating/referring to other healthcare professionals, documenting in medical records all on the same day of the visit for total time 35 minutes spent on the visit.

## 2020-09-29 NOTE — Assessment & Plan Note (Signed)
Refilled albuterol inhaler with refills so this problem will be resolved on future refills and refill of nebulizer today. She does not feel able to make smoking cessation attempt today.

## 2020-09-29 NOTE — Telephone Encounter (Signed)
Instructions given

## 2020-10-04 DIAGNOSIS — Z20822 Contact with and (suspected) exposure to covid-19: Secondary | ICD-10-CM | POA: Diagnosis not present

## 2020-10-05 NOTE — Telephone Encounter (Signed)
   Drai w/ [facility] is requesting a call back. [phone]

## 2020-10-09 ENCOUNTER — Other Ambulatory Visit: Payer: Self-pay | Admitting: Internal Medicine

## 2020-10-13 ENCOUNTER — Telehealth: Payer: Self-pay | Admitting: Internal Medicine

## 2020-10-13 NOTE — Telephone Encounter (Signed)
Patient requesting refill HYDROcodone-acetaminophen (NORCO/VICODIN) 5-325 MG tablet  [place], [place] [address]

## 2020-10-13 NOTE — Telephone Encounter (Signed)
See below

## 2020-10-14 MED ORDER — HYDROCODONE-ACETAMINOPHEN 5-325 MG PO TABS: 1.0000 | ORAL_TABLET | Freq: Two times a day (BID) | ORAL | 0 refills | Status: DC | PRN

## 2020-10-15 DIAGNOSIS — U071 COVID-19: Secondary | ICD-10-CM | POA: Diagnosis not present

## 2020-10-25 NOTE — Progress Notes (Signed)
Erica [name] [place] Phone: [phone] Subjective:   Erica [name], am serving as a scribe for Dr. Hulan Saas. This visit occurred during the SARS-CoV-2 public health emergency.  Safety protocols were in place, including screening questions prior to the visit, additional usage of staff PPE, and extensive cleaning of exam room while observing appropriate contact time as indicated for disinfecting solutions.   I'm seeing this patient by the request  of:  [name], MD  CC: Multiple joint pain  HTD:[id number]  09/08/2020 Noted today.  We will check labs including liver function.  History of Crohn's disease.  Recently did have labs checked by cardiologist and were in normal range 3 weeks ago but they are minorly elevated from patient's baseline  Worsening symptoms.  We will get patient to have another epidural.  Patient has responded previously to this and hopefully this will make significant difference in this.  Discussed icing regimen and home exercises, patient will continue to try to stay active on her own accord [name] change in medications but was given Toradol and Depo-Medrol today.  Secondary to the increasing jaundice we will get laboratory work-up to check of liver.  Patient does have follow-up with her primary care in the near future.  Repeat injection given today, tolerated the procedure well.  [name] significant difficulty.  Discussed icing regimen and home exercises, discussed avoiding certain activities and repetitive nature.  Patient did respond well to the injection previously and hopefully will continue to do well with conservative therapy.  Follow-up again 6 weeks  Update 10/26/2020 Erica [name] is a 70 y.o. female coming in with complaint of back pain. Epidural on 09/24/2020. Did have some relief from epidural.  Patient states that the back pain is not as severe.  Patient still though is aware of it.   Continues to have some discomfort on a daily basis but [name] radicular pain and not waking her up at night.  Patient states that injection in her hand also gave her some relief. Does still have pain and cramping in R hand. Noticed when she was painting that her pain increased.  Patient just had 1 episode of this.  Patient is wondering if this is more of her statin.  Cervical epidural back in April. Patient continues to have pain in cervical spine when lying in bed.  Patient states though [name] significant radicular symptoms.  Does notice a tightness at all times.      Past Medical History:  Diagnosis Date   Acute duodenitis 04/24/2017   Allergy    ANEMIA    Anxiety    BACK PAIN, LUMBAR    Cancer of ascending colon [id number] s/p colectomy 03/05/2009 02/18/2010   Qualifier: Diagnosis of  By: Carlean Purl MD, Dimas Millin    CHF (congestive heart failure) ([place])    Complete rotator cuff tear of left shoulder 11/27/2013   Ultrasound guided injection on November 27, 2013    COPD (chronic obstructive pulmonary disease) with emphysema ([place])    COPD exacerbation ([place]) 11/14/2017   Crohn's  02/2010   ileal ulcers, intol of entercort--refuses treatment   Emphysema of lung ([place])    GERD    History of blood transfusion    w/ c/s surgery   History of transfusion of whole blood    HYPERLIPIDEMIA    HYPERTENSION    Microscopic hematuria    chronic   Myocardial infarction ([place]) 2010   Obstruction of intestine  or colon ([place])    adhesions   OSTEOARTHRITIS    Rectal fissure    Possible fissure   Rotator cuff tear    Takotsubo syndrome 12/2008   URINARY INCONTINENCE    Past Surgical History:  Procedure Laterality Date   ABDOMINAL HYSTERECTOMY  1994   APPENDECTOMY  1964   BLADDER SUSPENSION     CESAREAN SECTION     x 3   CHOLECYSTECTOMY  1995   COLONOSCOPY  2017   COLONOSCOPY W/ BIOPSIES AND POLYPECTOMY  01/24/2011   (Crohn's)ileitis, internal hemorrhoids   ECTOPIC PREGNANCY SURGERY      ESOPHAGOGASTRODUODENOSCOPY     FACIAL COSMETIC SURGERY     HAND SURGERY Bilateral 1991   x 2   KNEE SURGERY Right 1981   LYSIS OF ADHESION  2010   ex lap/LOA for SBO Dr Excell Seltzer   ORBITAL FRACTURE SURGERY  1990   RIGHT COLECTOMY  03/2009   Right colectomy for colon CA.  Dr Donne Hazel   TONSILLECTOMY  1952   TOTAL KNEE ARTHROPLASTY  03/2010   Dr Alvan Dame.  Depuy   UPPER GASTROINTESTINAL ENDOSCOPY  05/16/2010   normal   UTERINE SUSPENSION     VIDEO BRONCHOSCOPY Bilateral 12/18/2014   Procedure: VIDEO BRONCHOSCOPY WITHOUT FLUORO;  Surgeon: Tanda Rockers, MD;  Location: [facility];  Service: Cardiopulmonary;  Laterality: Bilateral;   Social History   Socioeconomic History   Marital status: Widowed    Spouse name: Not on file   Number of children: 5   Years of education: 14   Highest education level: High school graduate  Occupational History   Occupation: Retired     Fish farm manager: UNEMPLOYED  Tobacco Use   Smoking status: Every Day    Packs/day: 1.00    Years: 53.00    Pack years: 53.00    Types: Cigarettes   Smokeless tobacco: Never  Vaping Use   Vaping Use: Never used  Substance and Sexual Activity   Alcohol use: Yes    Comment: occasional mixed drink   Drug use: [name]   Sexual activity: Not Currently    Birth control/protection: Surgical    Comment: Hysterectomy  Other Topics Concern   Not on file  Social History Narrative   Widowed; has five children, 8 grandchildren and 4 great-grandchildren.   Social Determinants of Health   Financial Resource Strain: Not on file  Food Insecurity: Not on file  Transportation Needs: Not on file  Physical Activity: Not on file  Stress: Not on file  Social Connections: Not on file   Allergies  Allergen Reactions   Morphine Nausea And Vomiting   Family History  Problem Relation Age of Onset   Colon cancer Father 5   Hypertension Father    Heart disease Father    Kidney disease Father    Colon cancer Sister 17   Liver cancer  Sister    Other Sister        amyloidosis   Throat cancer Mother    Arthritis Other        Parent, other relative   Stomach cancer Neg Hx    Rectal cancer Neg Hx      Current Outpatient Medications (Cardiovascular):    metoprolol tartrate (LOPRESSOR) 25 MG tablet, Take 1 tablet by mouth twice daily   rosuvastatin (CRESTOR) 10 MG tablet, Take 1 tablet by mouth five times a week Monday-Friday  Current Outpatient Medications (Respiratory):    albuterol (PROVENTIL) (2.5 MG/3ML) 0.083% nebulizer solution, Take 3 mLs (  2.5 mg total) by nebulization every 6 (six) hours as needed for wheezing or shortness of breath.   albuterol (VENTOLIN HFA) 108 (90 Base) MCG/ACT inhaler, INHALE 2 PUFFS BY MOUTH EVERY 4 HOURS AS NEEDED FOR WHEEZING FOR SHORTNESS OF BREATH   fluticasone (FLONASE) 50 MCG/ACT nasal spray, Use 2 spray(s) in each nostril once daily  Current Outpatient Medications (Analgesics):    aspirin EC 81 MG tablet, Take 81 mg by mouth daily.   HYDROcodone-acetaminophen (NORCO/VICODIN) 5-325 MG tablet, Take 1 tablet by mouth 2 (two) times daily as needed for moderate pain (pain).   Current Outpatient Medications (Other):    cyclobenzaprine (FLEXERIL) 10 MG tablet, Take 1 tablet by mouth three times daily as needed for muscle spasm   Reviewed prior external information including notes and imaging from  primary care provider As well as notes that were available from care everywhere and other healthcare systems.  Past medical history, social, surgical and family history all reviewed in electronic medical record.  [name] pertanent information unless stated regarding to the chief complaint.   Review of Systems:  [name] headache, visual changes, nausea, vomiting, diarrhea, constipation, dizziness, abdominal pain, skin rash, fevers, chills, night sweats, weight loss, swollen lymph nodes, , chest pain, , mood changes. POSITIVE muscle aches, body aches, joint swelling, shortness of breath  Objective   Blood pressure 130/82, pulse 84, height 5' 1"  (1.549 m), weight 111 lb (50.3 kg), SpO2 95 %.   General: [name] apparent distress alert and oriented x3 mood and affect normal, dressed appropriately.  Mild cachectic but seems at patient's baseline HEENT: Pupils equal, extraocular movements intact  Respiratory: Patient's speak in full sentences and does not appear short of breath  Cardiovascular: [name] lower extremity edema, non tender, [name] erythema  Gait mild antalgic Patient is severely tender in multiple areas.  Limited range of motion of the neck especially with extension and left-sided rotation.  Mild crepitus noted.  Patient has 4 out of 5 strength of the hands but symmetric.  Arthritic changes noted of the hands.  Low back does have the loss of lordosis.  Tenderness to palpation diffusely.   Impression and Recommendations:     The above documentation has been reviewed and is accurate and complete Lyndal Pulley, DO

## 2020-10-26 ENCOUNTER — Other Ambulatory Visit: Payer: Self-pay

## 2020-10-26 ENCOUNTER — Encounter: Payer: Self-pay | Admitting: Family Medicine

## 2020-10-26 ENCOUNTER — Ambulatory Visit (INDEPENDENT_AMBULATORY_CARE_PROVIDER_SITE_OTHER): Payer: Medicare Other | Admitting: Family Medicine

## 2020-10-26 DIAGNOSIS — M5010 Cervical disc disorder with radiculopathy, unspecified cervical region: Secondary | ICD-10-CM

## 2020-10-26 DIAGNOSIS — M5416 Radiculopathy, lumbar region: Secondary | ICD-10-CM

## 2020-10-26 DIAGNOSIS — I2510 Atherosclerotic heart disease of native coronary artery without angina pectoris: Secondary | ICD-10-CM

## 2020-10-26 DIAGNOSIS — M65331 Trigger finger, right middle finger: Secondary | ICD-10-CM

## 2020-10-26 NOTE — Patient Instructions (Signed)
Good to see you You do need to speak with Dr. Mallie Mussel about decreasing statin Continue to monitor neck-Can do another epidural It is a balancing act Keep doing things you enjoy See me again in 3 months

## 2020-10-26 NOTE — Assessment & Plan Note (Signed)
Mild worsening pain.  Discussed icing regimen and home exercises, discussed the possibility for other injections.  Secondary to other patients comorbidities very difficult to treat otherwise.  Patient is on hydrocodone for another provider.  Has Flexeril for breakthrough.  Follow-up with me 3 months and can call if wants an epidural which I think is a possibility.

## 2020-10-26 NOTE — Assessment & Plan Note (Signed)
Improvement from previous exam.  No longer having any radicular symptoms.  Epidural did help.  Continue to monitor and follow-up again in 3 months.  Discussed with patient on multiple different things including some of her other medications and encouraged her to follow-up with other physicians to discuss this.  Total time with patient today discussing with her and reviewing imaging 31 minutes

## 2020-10-26 NOTE — Assessment & Plan Note (Signed)
Stable at the moment, will monitor

## 2020-10-27 DIAGNOSIS — F99 Mental disorder, not otherwise specified: Secondary | ICD-10-CM | POA: Diagnosis not present

## 2020-11-03 DIAGNOSIS — F99 Mental disorder, not otherwise specified: Secondary | ICD-10-CM | POA: Diagnosis not present

## 2020-11-10 DIAGNOSIS — F99 Mental disorder, not otherwise specified: Secondary | ICD-10-CM | POA: Diagnosis not present

## 2020-11-11 ENCOUNTER — Telehealth: Payer: Self-pay | Admitting: Internal Medicine

## 2020-11-11 MED ORDER — HYDROCODONE-ACETAMINOPHEN 5-325 MG PO TABS: 1.0000 | ORAL_TABLET | Freq: Two times a day (BID) | ORAL | 0 refills | Status: DC | PRN

## 2020-11-11 NOTE — Telephone Encounter (Signed)
    Patient requesting refill for HYDROcodone-acetaminophen (NORCO/VICODIN) 5-325 MG tablet   [place], [place]

## 2020-11-11 NOTE — Telephone Encounter (Signed)
See below

## 2020-11-24 ENCOUNTER — Telehealth: Payer: Self-pay | Admitting: Family Medicine

## 2020-11-24 DIAGNOSIS — F99 Mental disorder, not otherwise specified: Secondary | ICD-10-CM | POA: Diagnosis not present

## 2020-11-24 NOTE — Telephone Encounter (Signed)
Patient called stating that she had her original two COVID vaccines and her first booster in November 2021. She wanted to know if it is too late now for her to get the second booster? And also, will it conflict with the Toradol/Depo injection that is scheduled for September 12th?

## 2020-11-24 NOTE — Telephone Encounter (Signed)
Informed patient of Dr Thompson Caul response.

## 2020-11-25 DIAGNOSIS — Z23 Encounter for immunization: Secondary | ICD-10-CM | POA: Diagnosis not present

## 2020-11-29 ENCOUNTER — Encounter: Payer: Self-pay | Admitting: Internal Medicine

## 2020-11-29 ENCOUNTER — Other Ambulatory Visit: Payer: Self-pay

## 2020-11-29 ENCOUNTER — Other Ambulatory Visit: Payer: Self-pay | Admitting: Interventional Cardiology

## 2020-11-29 ENCOUNTER — Ambulatory Visit (INDEPENDENT_AMBULATORY_CARE_PROVIDER_SITE_OTHER): Payer: Medicare Other | Admitting: Internal Medicine

## 2020-11-29 VITALS — BP 118/80 | HR 75 | Temp 98.4°F | Resp 18 | Ht 61.0 in | Wt 110.6 lb

## 2020-11-29 DIAGNOSIS — F1120 Opioid dependence, uncomplicated: Secondary | ICD-10-CM

## 2020-11-29 DIAGNOSIS — J410 Simple chronic bronchitis: Secondary | ICD-10-CM

## 2020-11-29 DIAGNOSIS — E782 Mixed hyperlipidemia: Secondary | ICD-10-CM | POA: Diagnosis not present

## 2020-11-29 DIAGNOSIS — I10 Essential (primary) hypertension: Secondary | ICD-10-CM | POA: Diagnosis not present

## 2020-11-29 DIAGNOSIS — I2510 Atherosclerotic heart disease of native coronary artery without angina pectoris: Secondary | ICD-10-CM | POA: Diagnosis not present

## 2020-11-29 LAB — COMPREHENSIVE METABOLIC PANEL
ALT: 16 U/L (ref 0–35)
AST: 22 U/L (ref 0–37)
Albumin: 4.3 g/dL (ref 3.5–5.2)
Alkaline Phosphatase: 53 U/L (ref 39–117)
BUN: 15 mg/dL (ref 6–23)
CO2: 30 mEq/L (ref 19–32)
Calcium: 9.9 mg/dL (ref 8.4–10.5)
Chloride: 103 mEq/L (ref 96–112)
Creatinine, Ser: 0.74 mg/dL (ref 0.40–1.20)
GFR: 81.81 mL/min (ref >60.00)
Glucose, Bld: 87 mg/dL (ref 70–99)
Potassium: 5 mEq/L (ref 3.5–5.1)
Sodium: 140 mEq/L (ref 135–145)
Total Bilirubin: 0.4 mg/dL (ref 0.2–1.2)
Total Protein: 6.9 g/dL (ref 6.0–8.3)

## 2020-11-29 LAB — CBC
HCT: 41.9 % (ref 36.0–46.0)
Hemoglobin: 14 g/dL (ref 12.0–15.0)
MCHC: 33.4 g/dL (ref 30.0–36.0)
MCV: 94 fl (ref 78.0–100.0)
Platelets: 231 10*3/uL (ref 150.0–400.0)
RBC: 4.46 Mil/uL (ref 3.87–5.11)
RDW: 13.7 % (ref 11.5–15.5)
WBC: 5.7 10*3/uL (ref 4.0–10.5)

## 2020-11-29 MED ORDER — ROSUVASTATIN CALCIUM 10 MG PO TABS: 10.0000 mg | ORAL_TABLET | ORAL | 3 refills | Status: DC

## 2020-11-29 NOTE — Progress Notes (Signed)
   Subjective:   Patient ID: Erica [name], female    DOB: [birth year], 70 y.o.   MRN: [id number]  HPI The patient is a 70 YO female coming in for follow up chronic conditions.   Review of Systems  Constitutional: Negative.   HENT: Negative.    Eyes: Negative.   Respiratory:  Negative for cough, chest tightness and shortness of breath.   Cardiovascular:  Negative for chest pain, palpitations and leg swelling.  Gastrointestinal:  Negative for abdominal distention, abdominal pain, constipation, diarrhea, nausea and vomiting.  Musculoskeletal:  Positive for arthralgias, back pain and neck pain.  Skin: Negative.   Neurological: Negative.   Psychiatric/Behavioral: Negative.     Objective:  Physical Exam Constitutional:      Appearance: She is well-developed.  HENT:     Head: Normocephalic and atraumatic.  Cardiovascular:     Rate and Rhythm: Normal rate and regular rhythm.  Pulmonary:     Effort: Pulmonary effort is normal. No respiratory distress.     Breath sounds: Normal breath sounds. No wheezing or rales.  Abdominal:     General: Bowel sounds are normal. There is no distension.     Palpations: Abdomen is soft.     Tenderness: There is no abdominal tenderness. There is no rebound.  Musculoskeletal:        General: Tenderness present.     Cervical back: Normal range of motion.  Skin:    General: Skin is warm and dry.  Neurological:     Mental Status: She is alert and oriented to person, place, and time.     Coordination: Coordination normal.    Vitals:   11/29/20 0920  BP: 118/80  Pulse: 75  Resp: 18  Temp: 98.4 F (36.9 C)  TempSrc: Oral  SpO2: 95%  Weight: 110 lb 9.6 oz (50.2 kg)  Height: 5' 1"  (1.549 m)    This visit occurred during the SARS-CoV-2 public health emergency.  Safety protocols were in place, including screening questions prior to the visit, additional usage of staff PPE, and extensive cleaning of exam room while observing appropriate contact  time as indicated for disinfecting solutions.   Assessment & Plan:

## 2020-11-29 NOTE — Patient Instructions (Addendum)
We have updated the prescription for the crestor.   We will check the labs today.

## 2020-11-30 NOTE — Assessment & Plan Note (Signed)
Review of [place] narcotic database without inappropriate fills, last UDS appropriate. Contract on file. She does not need progression. She is still working with sports medicine on alternate treatment as well and stays active to help reduce pain. Takes hydrocodone BID and refill when due.

## 2020-11-30 NOTE — Assessment & Plan Note (Signed)
Checking CMP and CBC today for stability. BP at goal on metoprolol 25 mg BID.

## 2020-11-30 NOTE — Assessment & Plan Note (Signed)
Unable to tolerate 5 times weekly crestor and is doing twice a week. We have updated prescription accordingly.

## 2020-11-30 NOTE — Assessment & Plan Note (Signed)
Still smoking and no desire to quit today.

## 2020-12-02 DIAGNOSIS — Z20822 Contact with and (suspected) exposure to covid-19: Secondary | ICD-10-CM | POA: Diagnosis not present

## 2020-12-07 ENCOUNTER — Ambulatory Visit (INDEPENDENT_AMBULATORY_CARE_PROVIDER_SITE_OTHER): Payer: Medicare Other | Admitting: Internal Medicine

## 2020-12-07 ENCOUNTER — Encounter: Payer: Self-pay | Admitting: Internal Medicine

## 2020-12-07 ENCOUNTER — Other Ambulatory Visit: Payer: Self-pay

## 2020-12-07 DIAGNOSIS — J441 Chronic obstructive pulmonary disease with (acute) exacerbation: Secondary | ICD-10-CM

## 2020-12-07 DIAGNOSIS — J410 Simple chronic bronchitis: Secondary | ICD-10-CM

## 2020-12-07 DIAGNOSIS — I2510 Atherosclerotic heart disease of native coronary artery without angina pectoris: Secondary | ICD-10-CM

## 2020-12-07 MED ORDER — PREDNISONE 20 MG PO TABS
40.0000 mg | ORAL_TABLET | Freq: Every day | ORAL | 0 refills | Status: DC
Start: 2020-12-07 — End: 2020-12-13

## 2020-12-07 MED ORDER — DOXYCYCLINE HYCLATE 100 MG PO TABS: 100.0000 mg | ORAL_TABLET | Freq: Two times a day (BID) | ORAL | 0 refills | Status: AC

## 2020-12-07 NOTE — Progress Notes (Signed)
   Subjective:   Patient ID: Erica [name], female    DOB: [birth year], 70 y.o.   MRN: [id number]  HPI The patient is a 70 YO female coming in for SOB and wheezing which she feels started after 2nd covid-19 booster shot. She is also having mild rash on the lower back. Denies significant itching but took benadryl for this last night and it helped. Breathing is not improving and she is concerned. She does not often get flare of COPD but when she does typically is severe.   Review of Systems  Constitutional: Negative.   HENT: Negative.    Eyes: Negative.   Respiratory:  Positive for cough, shortness of breath and wheezing. Negative for chest tightness.   Cardiovascular:  Negative for chest pain, palpitations and leg swelling.  Gastrointestinal:  Negative for abdominal distention, abdominal pain, constipation, diarrhea, nausea and vomiting.  Musculoskeletal: Negative.   Skin: Negative.   Neurological: Negative.   Psychiatric/Behavioral: Negative.     Objective:  Physical Exam Constitutional:      Appearance: She is well-developed.  HENT:     Head: Normocephalic and atraumatic.  Cardiovascular:     Rate and Rhythm: Normal rate and regular rhythm.  Pulmonary:     Effort: Pulmonary effort is normal. No respiratory distress.     Breath sounds: Wheezing present. No rales.     Comments: Good air flow bilaterally, no focal sounds to indicate pneumonia Abdominal:     General: Bowel sounds are normal. There is no distension.     Palpations: Abdomen is soft.     Tenderness: There is no abdominal tenderness. There is no rebound.  Musculoskeletal:     Cervical back: Normal range of motion.  Skin:    General: Skin is warm and dry.     Findings: Rash present.  Neurological:     Mental Status: She is alert and oriented to person, place, and time.     Coordination: Coordination normal.    Vitals:   12/07/20 1030  BP: 128/90  Pulse: 75  Resp: 18  Temp: 98.7 F (37.1 C)  TempSrc: Oral   SpO2: 99%  Weight: 113 lb (51.3 kg)  Height: 5' 1"  (1.549 m)    This visit occurred during the SARS-CoV-2 public health emergency.  Safety protocols were in place, including screening questions prior to the visit, additional usage of staff PPE, and extensive cleaning of exam room while observing appropriate contact time as indicated for disinfecting solutions.   Assessment & Plan:

## 2020-12-07 NOTE — Patient Instructions (Signed)
You can take zyrtec for allergies 1 pill daily. For itching up to 3 times a day.  We have sent in doxycycline to take 1 pill twice a day.   We have sent in prednisone to take 2 pills daily for 1 week.

## 2020-12-08 ENCOUNTER — Encounter: Payer: Self-pay | Admitting: Internal Medicine

## 2020-12-08 DIAGNOSIS — F99 Mental disorder, not otherwise specified: Secondary | ICD-10-CM | POA: Diagnosis not present

## 2020-12-08 NOTE — Assessment & Plan Note (Signed)
Worse due to COPD flare

## 2020-12-08 NOTE — Assessment & Plan Note (Signed)
With flare today and treating with prednisone and doxycycline. She is instructed to continue her normal albuterol inhaler and nebulizer and call back if no improvement.

## 2020-12-10 NOTE — Progress Notes (Signed)
Erica [name] Phone: [phone] Subjective:   Erica [name], am serving as a scribe for Dr. Hulan Saas.  This visit occurred during the SARS-CoV-2 public health emergency.  Safety protocols were in place, including screening questions prior to the visit, additional usage of staff PPE, and extensive cleaning of exam room while observing appropriate contact time as indicated for disinfecting solutions.   I'm seeing this patient by the request  of:  Erica Koch, MD  CC: Allover pain  [id number]  10/26/2020 Improvement from previous exam.  [name] longer having any radicular symptoms.  Epidural did help.  Continue to monitor and follow-up again in 3 months.  Discussed with patient on multiple different things including some of her other medications and encouraged her to follow-up with other physicians to discuss this.  Total time with patient today discussing with her and reviewing imaging 31 minutes  Mild worsening pain.  Discussed icing regimen and home exercises, discussed the possibility for other injections.  Secondary to other patients comorbidities very difficult to treat otherwise.  Patient is on hydrocodone for another provider.  Has Flexeril for breakthrough.  Follow-up with me 3 months and can call if wants an epidural which I think is a possibility.  Update 12/13/2020 Erica [name] is a 70 y.o. female coming in with complaint of neck, L trigger finger, middle, and low back pain. Patient states that her pain increased dramatically since having booster 11/25/2020. Developed burning sensation, skin irritation and pain. Is on prednisone Patient states that she only has 1 more day of the prednisone left.  Has been on antibiotics as well at this moment.  Denies any fevers or chills though at the moment.  Patient does does show some insect bites on the lower extremity.      Past Medical History:  Diagnosis  Date   Acute duodenitis 04/24/2017   Allergy    ANEMIA    Anxiety    BACK PAIN, LUMBAR    Cancer of ascending colon pTispN0 s/p colectomy 03/05/2009 02/18/2010   Qualifier: Diagnosis of  By: Carlean Purl MD, Tonna Boehringer E    CHF (congestive heart failure) ([place])    Complete rotator cuff tear of left shoulder 11/27/2013   Ultrasound guided injection on November 27, 2013    COPD (chronic obstructive pulmonary disease) with emphysema ([place])    COPD exacerbation ([place]) 11/14/2017   Crohn's  02/2010   ileal ulcers, intol of entercort--refuses treatment   Emphysema of lung ([place])    GERD    History of blood transfusion    w/ c/s surgery   History of transfusion of whole blood    HYPERLIPIDEMIA    HYPERTENSION    Microscopic hematuria    chronic   Myocardial infarction ([place]) 2010   Obstruction of intestine or colon ([place])    adhesions   OSTEOARTHRITIS    Rectal fissure    Possible fissure   Rotator cuff tear    Takotsubo syndrome 12/2008   URINARY INCONTINENCE    Past Surgical History:  Procedure Laterality Date   ABDOMINAL HYSTERECTOMY  1994   APPENDECTOMY  1964   BLADDER SUSPENSION     CESAREAN SECTION     x 3   CHOLECYSTECTOMY  1995   COLONOSCOPY  2017   COLONOSCOPY W/ BIOPSIES AND POLYPECTOMY  01/24/2011   (Crohn's)ileitis, internal hemorrhoids   ECTOPIC PREGNANCY SURGERY     ESOPHAGOGASTRODUODENOSCOPY     FACIAL COSMETIC  SURGERY     HAND SURGERY Bilateral 1991   x 2   KNEE SURGERY Right 1981   LYSIS OF ADHESION  2010   ex lap/LOA for SBO Dr Excell Seltzer   ORBITAL FRACTURE SURGERY  1990   RIGHT COLECTOMY  03/2009   Right colectomy for colon CA.  Dr Donne Hazel   TONSILLECTOMY  1952   TOTAL KNEE ARTHROPLASTY  03/2010   Dr Alvan Dame.  Depuy   UPPER GASTROINTESTINAL ENDOSCOPY  05/16/2010   normal   UTERINE SUSPENSION     VIDEO BRONCHOSCOPY Bilateral 12/18/2014   Procedure: VIDEO BRONCHOSCOPY WITHOUT FLUORO;  Surgeon: Tanda Rockers, MD;  Location: [facility];  Service: Cardiopulmonary;   Laterality: Bilateral;   Social History   Socioeconomic History   Marital status: Widowed    Spouse name: Not on file   Number of children: 5   Years of education: 14   Highest education level: High school graduate  Occupational History   Occupation: Retired     Fish farm manager: UNEMPLOYED  Tobacco Use   Smoking status: Every Day    Packs/day: 1.00    Years: 53.00    Pack years: 53.00    Types: Cigarettes   Smokeless tobacco: Never  Vaping Use   Vaping Use: Never used  Substance and Sexual Activity   Alcohol use: Yes    Comment: occasional mixed drink   Drug use: [name]   Sexual activity: Not Currently    Birth control/protection: Surgical    Comment: Hysterectomy  Other Topics Concern   Not on file  Social History Narrative   Widowed; has five children, 8 grandchildren and 4 great-grandchildren.   Social Determinants of Health   Financial Resource Strain: Not on file  Food Insecurity: Not on file  Transportation Needs: Not on file  Physical Activity: Not on file  Stress: Not on file  Social Connections: Not on file   Allergies  Allergen Reactions   Morphine Nausea And Vomiting   Family History  Problem Relation Age of Onset   Colon cancer Father 72   Hypertension Father    Heart disease Father    Kidney disease Father    Colon cancer Sister 73   Liver cancer Sister    Other Sister        amyloidosis   Throat cancer Mother    Arthritis Other        Parent, other relative   Stomach cancer Neg Hx    Rectal cancer Neg Hx     Current Outpatient Medications (Endocrine & Metabolic):    predniSONE (DELTASONE) 20 MG tablet, Take 2 tablets (40 mg total) by mouth daily with breakfast.  Current Outpatient Medications (Cardiovascular):    metoprolol tartrate (LOPRESSOR) 25 MG tablet, Take 1 tablet (25 mg total) by mouth 2 (two) times daily.   rosuvastatin (CRESTOR) 10 MG tablet, Take 1 tablet (10 mg total) by mouth 2 (two) times a week. Monday & Friday  Current  Outpatient Medications (Respiratory):    albuterol (PROVENTIL) (2.5 MG/3ML) 0.083% nebulizer solution, Take 3 mLs (2.5 mg total) by nebulization every 6 (six) hours as needed for wheezing or shortness of breath.   albuterol (VENTOLIN HFA) 108 (90 Base) MCG/ACT inhaler, INHALE 2 PUFFS BY MOUTH EVERY 4 HOURS AS NEEDED FOR WHEEZING FOR SHORTNESS OF BREATH   fluticasone (FLONASE) 50 MCG/ACT nasal spray, Use 2 spray(s) in each nostril once daily  Current Outpatient Medications (Analgesics):    aspirin EC 81 MG tablet, Take 81 mg by  mouth daily.   HYDROcodone-acetaminophen (NORCO/VICODIN) 5-325 MG tablet, Take 1 tablet by mouth 2 (two) times daily as needed for moderate pain (pain).   Current Outpatient Medications (Other):    cyclobenzaprine (FLEXERIL) 10 MG tablet, Take 1 tablet by mouth three times daily as needed for muscle spasm (Patient taking differently: Take 10 mg by mouth as needed.)   doxycycline (VIBRA-TABS) 100 MG tablet, Take 1 tablet (100 mg total) by mouth 2 (two) times daily for 7 days.   Vitamin D, Ergocalciferol, (DRISDOL) 1.25 MG (50000 UNIT) CAPS capsule, Take 1 capsule (50,000 Units total) by mouth every 7 (seven) days.   Reviewed prior external information including notes and imaging from  primary care provider As well as notes that were available from care everywhere and other healthcare systems.  Past medical history, social, surgical and family history all reviewed in electronic medical record.  [name] pertanent information unless stated regarding to the chief complaint.   Review of Systems:  [name] headache, visual changes, nausea, vomiting, diarrhea, constipation, dizziness, abdominal pain, skin rash, fevers, chills, night sweats, weight loss, swollen lymph nodes, chest pain, shortness of breath, mood changes. POSITIVE muscle aches, body aches, joint swelling  Objective  Blood pressure 110/78, pulse (!) 54, height 5' 1"  (1.549 m), weight 114 lb (51.7 kg), SpO2 99 %.    General: [name] apparent distress alert and oriented x3 mood and affect normal, dressed appropriately.  Patient does appear to be cachectic HEENT: Pupils equal, extraocular movements intact  Respiratory: Patient's speak in full sentences and does not appear short of breath  Cardiovascular: [name] lower extremity edema, non tender, [name] erythema  Gait normal with good balance and coordination.  MSK: Neck exam does have some loss of lordosis.  Patient does have trace fluid noted of the knees bilaterally.  Patient does have an area of the lower extremity on the left From a potential insect bite but [name] significant fluctuance noted or any erythema.  Patient is neurovascularly intact.  He has atrophy of the musculature of the extremities bilaterally.    Impression and Recommendations:     The above documentation has been reviewed and is accurate and complete Lyndal Pulley, DO

## 2020-12-13 ENCOUNTER — Ambulatory Visit (INDEPENDENT_AMBULATORY_CARE_PROVIDER_SITE_OTHER): Payer: Medicare Other | Admitting: Family Medicine

## 2020-12-13 ENCOUNTER — Other Ambulatory Visit: Payer: Self-pay

## 2020-12-13 ENCOUNTER — Telehealth: Payer: Self-pay | Admitting: Internal Medicine

## 2020-12-13 VITALS — BP 110/78 | HR 54 | Ht 61.0 in | Wt 114.0 lb

## 2020-12-13 DIAGNOSIS — M5412 Radiculopathy, cervical region: Secondary | ICD-10-CM | POA: Diagnosis not present

## 2020-12-13 DIAGNOSIS — I2510 Atherosclerotic heart disease of native coronary artery without angina pectoris: Secondary | ICD-10-CM

## 2020-12-13 DIAGNOSIS — M5010 Cervical disc disorder with radiculopathy, unspecified cervical region: Secondary | ICD-10-CM

## 2020-12-13 MED ORDER — METHYLPREDNISOLONE ACETATE 80 MG/ML IJ SUSP
80.0000 mg | Freq: Once | INTRAMUSCULAR | Status: AC
  Administered 2020-12-13: 40 mg via INTRAMUSCULAR

## 2020-12-13 MED ORDER — HYDROCODONE-ACETAMINOPHEN 5-325 MG PO TABS: 1.0000 | ORAL_TABLET | Freq: Two times a day (BID) | ORAL | 0 refills | Status: DC | PRN

## 2020-12-13 MED ORDER — KETOROLAC TROMETHAMINE 30 MG/ML IJ SOLN
30.0000 mg | Freq: Once | INTRAMUSCULAR | Status: AC
Start: 2020-12-13 — End: 2020-12-13
  Administered 2020-12-13: 30 mg via INTRAMUSCULAR

## 2020-12-13 MED ORDER — PREDNISONE 20 MG PO TABS: 40.0000 mg | ORAL_TABLET | Freq: Every day | ORAL | 0 refills | Status: DC

## 2020-12-13 MED ORDER — VITAMIN D (ERGOCALCIFEROL) 1.25 MG (50000 UNIT) PO CAPS: 50000.0000 [IU] | ORAL_CAPSULE | ORAL | 0 refills | Status: DC

## 2020-12-13 NOTE — Telephone Encounter (Signed)
See below

## 2020-12-13 NOTE — Assessment & Plan Note (Signed)
Patient has significant degenerative disc disease.  Multiple different muscle pains noted.  I discussed with patient that this could be potentially secondary to the booster which is the timing.  Patient recently has had some different insect bites as well and we would discuss potential laboratory work-up is necessary including ruling out Lyme disease.  Patient notes she had been doing relatively well hopefully.  We discussed potentially repeating the epidural of the neck.  Toradol and Depo-Medrol given today.  Patient is going out of town and feels like she may need some more prednisone and given another 5-day burst if necessary.  Patient has recently been on the doxycycline which should be covering her for any other type of potential tick bite.  With patient's other comorbidities may need to consider the laboratory work-up we discussed previously and follow-up with me again in 4 to 6 weeks

## 2020-12-13 NOTE — Patient Instructions (Signed)
Will consider labs if needed Prednisone for next 7 days Injections today See me 3-4 weeks

## 2020-12-13 NOTE — Telephone Encounter (Signed)
1.Medication Requested: HYDROcodone-acetaminophen (NORCO/VICODIN) 5-325 MG tablet  2. Pharmacy (Name, Street, [place]): [place], [address]  Phone:  [phone] Fax:  [phone]   3. On Med List: yes  4. Last Visit with PCP: 09.06.22  5. Next visit date with PCP: 11.29.22   Agent: Please be advised that RX refills may take up to 3 business days. We ask that you follow-up with your pharmacy.

## 2020-12-15 DIAGNOSIS — F99 Mental disorder, not otherwise specified: Secondary | ICD-10-CM | POA: Diagnosis not present

## 2020-12-23 DIAGNOSIS — F99 Mental disorder, not otherwise specified: Secondary | ICD-10-CM | POA: Diagnosis not present

## 2020-12-28 ENCOUNTER — Telehealth: Payer: Self-pay | Admitting: Internal Medicine

## 2020-12-28 NOTE — Telephone Encounter (Signed)
Patient says every time she comes back from [place] she is in respiratory distress.. says she recently gotten back & it is worse than ever  Wants provider to refill predniSONE (DELTASONE) 20 MG tablet  Pharmacy:  [place], [address] Phone:  [phone]  Fax:  [phone]

## 2020-12-29 DIAGNOSIS — F99 Mental disorder, not otherwise specified: Secondary | ICD-10-CM | POA: Diagnosis not present

## 2020-12-31 NOTE — Telephone Encounter (Signed)
Called patient. LVM asking her to call our office to schedule a follow up visit. Office number was provided.    If patient calls back please a schedule a follow up visit with Dr. Sharlet Salina.

## 2021-01-04 NOTE — Progress Notes (Signed)
[place] Phone: [phone] Subjective:   Erica [name], am serving as a scribe for Dr. Hulan Saas.  This visit occurred during the SARS-CoV-2 public health emergency.  Safety protocols were in place, including screening questions prior to the visit, additional usage of staff PPE, and extensive cleaning of exam room while observing appropriate contact time as indicated for disinfecting solutions.   I'm seeing this patient by the request  of:  [name], MD  CC: Neck pain follow-up  [id number]  12/13/2020 Patient has significant degenerative disc disease.  Multiple different muscle pains noted.  I discussed with patient that this could be potentially secondary to the booster which is the timing.  Patient recently has had some different insect bites as well and we would discuss potential laboratory work-up is necessary including ruling out Lyme disease.  Patient notes she had been doing relatively well hopefully.  We discussed potentially repeating the epidural of the neck.  Toradol and Depo-Medrol given today.  Patient is going out of town and feels like she may need some more prednisone and given another 5-day burst if necessary.  Patient has recently been on the doxycycline which should be covering her for any other type of potential tick bite.  With patient's other comorbidities may need to consider the laboratory work-up we discussed previously and follow-up with me again in 4 to 6 weeks  Updated 01/05/2021 Erica [name] is a 70 y.o. female coming in with complaint of neck pain. Patient has not had increase in neck pain since last visit. Patient went to [facility] recently. Patient did take prednisone and this did not help her pain in her chest that increased during her trip. Patient continues to have pain in chest.  Patient states that it hurts and seems to get worse with activity.  Having difficulty catching  her breath.  Patient does have a past medical history significant for COPD and may be having an exacerbation.  States that it did not happen until she traveled to [place].  Patient has done COVID test which have been negative.  Patient was attempting to get in with primary care physician but was unable to do so.  Patient had Follow-up scheduled so thought she could potentially talk about it now.    Past Medical History:  Diagnosis Date   Acute duodenitis 04/24/2017   Allergy    ANEMIA    Anxiety    BACK PAIN, LUMBAR    Cancer of ascending colon pTispN0 s/p colectomy 03/05/2009 02/18/2010   Qualifier: Diagnosis of  By: Carlean Purl MD, Dimas Millin    CHF (congestive heart failure) ([place])    Complete rotator cuff tear of left shoulder 11/27/2013   Ultrasound guided injection on November 27, 2013    COPD (chronic obstructive pulmonary disease) with emphysema ([place])    COPD exacerbation ([place]) 11/14/2017   Crohn's  02/2010   ileal ulcers, intol of entercort--refuses treatment   Emphysema of lung ([place])    GERD    History of blood transfusion    w/ c/s surgery   History of transfusion of whole blood    HYPERLIPIDEMIA    HYPERTENSION    Microscopic hematuria    chronic   Myocardial infarction ([place]) 2010   Obstruction of intestine or colon ([facility])    adhesions   OSTEOARTHRITIS    Rectal fissure    Possible fissure   Rotator cuff tear    Takotsubo  syndrome 12/2008   URINARY INCONTINENCE    Past Surgical History:  Procedure Laterality Date   ABDOMINAL HYSTERECTOMY  1994   APPENDECTOMY  1964   BLADDER SUSPENSION     CESAREAN SECTION     x 3   CHOLECYSTECTOMY  1995   COLONOSCOPY  2017   COLONOSCOPY W/ BIOPSIES AND POLYPECTOMY  01/24/2011   (Crohn's)ileitis, internal hemorrhoids   ECTOPIC PREGNANCY SURGERY     ESOPHAGOGASTRODUODENOSCOPY     FACIAL COSMETIC SURGERY     HAND SURGERY Bilateral 1991   x 2   KNEE SURGERY Right 1981   LYSIS OF ADHESION  2010   ex lap/LOA for SBO Dr Excell Seltzer    ORBITAL FRACTURE SURGERY  1990   RIGHT COLECTOMY  03/2009   Right colectomy for colon CA.  Dr Donne Hazel   TONSILLECTOMY  1952   TOTAL KNEE ARTHROPLASTY  03/2010   Dr Alvan Dame.  Depuy   UPPER GASTROINTESTINAL ENDOSCOPY  05/16/2010   normal   UTERINE SUSPENSION     VIDEO BRONCHOSCOPY Bilateral 12/18/2014   Procedure: VIDEO BRONCHOSCOPY WITHOUT FLUORO;  Surgeon: Tanda Rockers, MD;  Location: [facility];  Service: Cardiopulmonary;  Laterality: Bilateral;   Social History   Socioeconomic History   Marital status: Widowed    Spouse name: Not on file   Number of children: 5   Years of education: 14   Highest education level: High school graduate  Occupational History   Occupation: Retired     Fish farm manager: UNEMPLOYED  Tobacco Use   Smoking status: Every Day    Packs/day: 1.00    Years: 53.00    Pack years: 53.00    Types: Cigarettes   Smokeless tobacco: Never  Vaping Use   Vaping Use: Never used  Substance and Sexual Activity   Alcohol use: Yes    Comment: occasional mixed drink   Drug use: [name]   Sexual activity: Not Currently    Birth control/protection: Surgical    Comment: Hysterectomy  Other Topics Concern   Not on file  Social History Narrative   Widowed; has five children, 8 grandchildren and 4 great-grandchildren.   Social Determinants of Health   Financial Resource Strain: Not on file  Food Insecurity: Not on file  Transportation Needs: Not on file  Physical Activity: Not on file  Stress: Not on file  Social Connections: Not on file   Allergies  Allergen Reactions   Morphine Nausea And Vomiting   Family History  Problem Relation Age of Onset   Colon cancer Father 58   Hypertension Father    Heart disease Father    Kidney disease Father    Colon cancer Sister 74   Liver cancer Sister    Other Sister        amyloidosis   Throat cancer Mother    Arthritis Other        Parent, other relative   Stomach cancer Neg Hx    Rectal cancer Neg Hx     Current  Outpatient Medications (Endocrine & Metabolic):    predniSONE (DELTASONE) 20 MG tablet, Take 2 tablets (40 mg total) by mouth daily with breakfast.  Current Outpatient Medications (Cardiovascular):    metoprolol tartrate (LOPRESSOR) 25 MG tablet, Take 1 tablet (25 mg total) by mouth 2 (two) times daily.   rosuvastatin (CRESTOR) 10 MG tablet, Take 1 tablet (10 mg total) by mouth 2 (two) times a week. Monday & Friday  Current Outpatient Medications (Respiratory):    albuterol (PROVENTIL) (2.5  MG/3ML) 0.083% nebulizer solution, Take 3 mLs (2.5 mg total) by nebulization every 6 (six) hours as needed for wheezing or shortness of breath.   albuterol (VENTOLIN HFA) 108 (90 Base) MCG/ACT inhaler, INHALE 2 PUFFS BY MOUTH EVERY 4 HOURS AS NEEDED FOR WHEEZING FOR SHORTNESS OF BREATH   fluticasone (FLONASE) 50 MCG/ACT nasal spray, Use 2 spray(s) in each nostril once daily  Current Outpatient Medications (Analgesics):    aspirin EC 81 MG tablet, Take 81 mg by mouth daily.   HYDROcodone-acetaminophen (NORCO/VICODIN) 5-325 MG tablet, Take 1 tablet by mouth 2 (two) times daily as needed for moderate pain (pain).   Current Outpatient Medications (Other):    cyclobenzaprine (FLEXERIL) 10 MG tablet, Take 1 tablet by mouth three times daily as needed for muscle spasm (Patient taking differently: Take 10 mg by mouth as needed.)   Vitamin D, Ergocalciferol, (DRISDOL) 1.25 MG (50000 UNIT) CAPS capsule, Take 1 capsule (50,000 Units total) by mouth every 7 (seven) days.   Reviewed prior external information including notes and imaging from  primary care provider   Review of Systems:  [name] headache, visual changes, nausea, vomiting, diarrhea, constipation, dizziness, abdominal pain, skin rash, fevers, chills, night sweats, weight loss, swollen lymph nodes, body aches, joint swelling,  mood changes. POSITIVE muscle aches, chest pain, shortness of breath  Objective  Blood pressure 102/70, pulse 92, height 5' 1"   (1.549 m), weight 108 lb (49 kg), SpO2 91 %.   General: Alert and oriented x3 but patient does appear to be uncomfortable. HEENT: Pupils equal, extraocular movements intact  Respiratory: the patient is having difficulty catching her breath some.  Patient is even having difficulty speaking in full sentences secondary to some shortness of breath.  Patient does have significant rhonchi with listening but patient has movement in all lobes appreciated at the moment may be some mild decrease in sounds of the left lower lobe. Cardiovascular: Trace edema of the lower extremity but [name] significant tenderness more than patient's baseline in the calves. Gait mild antalgic     Impression and Recommendations:     The above documentation has been reviewed and is accurate and complete Lyndal Pulley, DO

## 2021-01-05 ENCOUNTER — Other Ambulatory Visit: Payer: Self-pay

## 2021-01-05 ENCOUNTER — Emergency Department (HOSPITAL_COMMUNITY)
Admission: EM | Admit: 2021-01-05 | Discharge: 2021-01-06 | Disposition: A | Discharge status: Home / Self Care | Payer: Medicare Other | Attending: Emergency Medicine | Admitting: Emergency Medicine

## 2021-01-05 ENCOUNTER — Emergency Department (HOSPITAL_COMMUNITY): Payer: Medicare Other

## 2021-01-05 ENCOUNTER — Ambulatory Visit (INDEPENDENT_AMBULATORY_CARE_PROVIDER_SITE_OTHER): Payer: Medicare Other | Admitting: Family Medicine

## 2021-01-05 ENCOUNTER — Encounter (HOSPITAL_COMMUNITY): Payer: Self-pay | Admitting: Oncology

## 2021-01-05 VITALS — BP 102/70 | HR 92 | Ht 61.0 in | Wt 108.0 lb

## 2021-01-05 DIAGNOSIS — J441 Chronic obstructive pulmonary disease with (acute) exacerbation: Secondary | ICD-10-CM

## 2021-01-05 DIAGNOSIS — I509 Heart failure, unspecified: Secondary | ICD-10-CM | POA: Diagnosis not present

## 2021-01-05 DIAGNOSIS — Z85038 Personal history of other malignant neoplasm of large intestine: Secondary | ICD-10-CM | POA: Insufficient documentation

## 2021-01-05 DIAGNOSIS — J189 Pneumonia, unspecified organism: Secondary | ICD-10-CM | POA: Diagnosis not present

## 2021-01-05 DIAGNOSIS — Z79899 Other long term (current) drug therapy: Secondary | ICD-10-CM | POA: Diagnosis not present

## 2021-01-05 DIAGNOSIS — Z96659 Presence of unspecified artificial knee joint: Secondary | ICD-10-CM | POA: Diagnosis not present

## 2021-01-05 DIAGNOSIS — Z7982 Long term (current) use of aspirin: Secondary | ICD-10-CM | POA: Insufficient documentation

## 2021-01-05 DIAGNOSIS — Z7952 Long term (current) use of systemic steroids: Secondary | ICD-10-CM | POA: Diagnosis not present

## 2021-01-05 DIAGNOSIS — I2510 Atherosclerotic heart disease of native coronary artery without angina pectoris: Secondary | ICD-10-CM | POA: Insufficient documentation

## 2021-01-05 DIAGNOSIS — R079 Chest pain, unspecified: Secondary | ICD-10-CM | POA: Diagnosis not present

## 2021-01-05 DIAGNOSIS — I700 Atherosclerosis of aorta: Secondary | ICD-10-CM | POA: Diagnosis not present

## 2021-01-05 DIAGNOSIS — F99 Mental disorder, not otherwise specified: Secondary | ICD-10-CM | POA: Diagnosis not present

## 2021-01-05 DIAGNOSIS — J449 Chronic obstructive pulmonary disease, unspecified: Secondary | ICD-10-CM | POA: Diagnosis not present

## 2021-01-05 DIAGNOSIS — I110 Hypertensive heart disease with heart failure: Secondary | ICD-10-CM | POA: Insufficient documentation

## 2021-01-05 DIAGNOSIS — R0602 Shortness of breath: Secondary | ICD-10-CM | POA: Diagnosis not present

## 2021-01-05 DIAGNOSIS — R000 Tachycardia, unspecified: Secondary | ICD-10-CM | POA: Diagnosis not present

## 2021-01-05 DIAGNOSIS — F17210 Nicotine dependence, cigarettes, uncomplicated: Secondary | ICD-10-CM | POA: Diagnosis not present

## 2021-01-05 DIAGNOSIS — R918 Other nonspecific abnormal finding of lung field: Secondary | ICD-10-CM | POA: Diagnosis not present

## 2021-01-05 DIAGNOSIS — R0789 Other chest pain: Secondary | ICD-10-CM | POA: Diagnosis not present

## 2021-01-05 LAB — CBC WITH DIFFERENTIAL/PLATELET
Abs Immature Granulocytes: 0.06 10*3/uL (ref 0.00–0.07)
Basophils Absolute: 0.1 10*3/uL (ref 0.0–0.1)
Basophils Relative: 1 %
Eosinophils Absolute: 0.2 10*3/uL (ref 0.0–0.5)
Eosinophils Relative: 2 %
HCT: 42.7 % (ref 36.0–46.0)
Hemoglobin: 13.7 g/dL (ref 12.0–15.0)
Immature Granulocytes: 1 %
Lymphocytes Relative: 32 %
Lymphs Abs: 2.6 10*3/uL (ref 0.7–4.0)
MCH: 30.7 pg (ref 26.0–34.0)
MCHC: 32.1 g/dL (ref 30.0–36.0)
MCV: 95.7 fL (ref 80.0–100.0)
Monocytes Absolute: 0.8 10*3/uL (ref 0.1–1.0)
Monocytes Relative: 10 %
Neutro Abs: 4.6 10*3/uL (ref 1.7–7.7)
Neutrophils Relative %: 54 %
Platelets: 250 10*3/uL (ref 150–400)
RBC: 4.46 MIL/uL (ref 3.87–5.11)
RDW: 12.4 % (ref 11.5–15.5)
WBC: 8.3 10*3/uL (ref 4.0–10.5)
nRBC: 0 % (ref 0.0–0.2)

## 2021-01-05 LAB — COMPREHENSIVE METABOLIC PANEL
ALT: 19 U/L (ref 0–44)
AST: 21 U/L (ref 15–41)
Albumin: 4 g/dL (ref 3.5–5.0)
Alkaline Phosphatase: 62 U/L (ref 38–126)
Anion gap: 7 (ref 5–15)
BUN: 22 mg/dL (ref 8–23)
CO2: 29 mmol/L (ref 22–32)
Calcium: 10.1 mg/dL (ref 8.9–10.3)
Chloride: 105 mmol/L (ref 98–111)
Creatinine, Ser: 0.86 mg/dL (ref 0.44–1.00)
GFR, Estimated: >60 mL/min (ref >60)
Glucose, Bld: 98 mg/dL (ref 70–99)
Potassium: 4.6 mmol/L (ref 3.5–5.1)
Sodium: 141 mmol/L (ref 135–145)
Total Bilirubin: 0.5 mg/dL (ref 0.3–1.2)
Total Protein: 7.3 g/dL (ref 6.5–8.1)

## 2021-01-05 LAB — TROPONIN I (HIGH SENSITIVITY): Troponin I (High Sensitivity): 5 ng/L (ref <18)

## 2021-01-05 LAB — MAGNESIUM: Magnesium: 1.7 mg/dL (ref 1.7–2.4)

## 2021-01-05 MED ORDER — IOHEXOL 350 MG/ML SOLN
80.0000 mL | Freq: Once | INTRAVENOUS | Status: AC | PRN
  Administered 2021-01-05: 80 mL via INTRAVENOUS

## 2021-01-05 NOTE — Assessment & Plan Note (Signed)
There is a good chance that patient is having just a COPD exacerbation but patient is seeming to be more short of breath than her baseline.  In addition to this patient has had a travel history and patient is at an increased risk of potential pulmonary embolism.  Discussed with patient that we do sports medicine and do feel that she should be further evaluated by another provider.  At this point secondary to how long it is going and patient stating that it is getting worse I would think that possibly being evaluated at the emergency room would be beneficial.  Patient does need a CT scan likely to rule out pulmonary embolism.  Chest x-ray would also be helpful to rule out such things as a COPD exacerbation or the possibility of pneumonia.  Patient is in agreement with the plan and will go over there herself.  Do believe that patient is okay to transfer self at this time.

## 2021-01-05 NOTE — ED Notes (Signed)
Pt O2 is around 92% while in bed. While ambulating pt O2 remained between 94-96%

## 2021-01-05 NOTE — ED Provider Notes (Signed)
[facility] DEPT Provider Note   CSN: [id number] Arrival date & time: 01/05/21  1549     History Chief Complaint  Patient presents with   Shortness of Breath    Erica [name] is a 70 y.o. female with PMHx HTN, HLD, GERD, COPD, CHF with EF 45% who presents to the ED today with complaint of gradual onset, constant, worsening, SOB that began 2 weeks ago.  She reports she visited [place] from 09/17 - 09/21 and every time she goes there she has a COPD exacerbation.  She states she is unsure if it is the weather there.  She reports that she tried to get into see her PCP today however was unable to.  She had an appointment with her sports medicine physician and therefore decided to discuss it with him.  She was advised to come to the ED for further evaluation and concern for possible PE given she has been complaining of chest pain as well.  Patient states that she typically will have chest pain with COPD exacerbations and this does not feel any different.  She also complains of productive cough.  She states she has tested twice for COVID in the past 2 weeks without any positive test.  She does report that while in [place] she did have a low-grade fever of 99.0.   The history is provided by the patient, medical records and a relative.      Past Medical History:  Diagnosis Date   Acute duodenitis 04/24/2017   Allergy    ANEMIA    Anxiety    BACK PAIN, LUMBAR    Cancer of ascending colon pTispN0 s/p colectomy 03/05/2009 02/18/2010   Qualifier: Diagnosis of  By: Carlean Purl MD, Dimas Millin    CHF (congestive heart failure) ([place])    Complete rotator cuff tear of left shoulder 11/27/2013   Ultrasound guided injection on November 27, 2013    COPD (chronic obstructive pulmonary disease) with emphysema ([place])    COPD exacerbation ([place]) 11/14/2017   Crohn's  02/2010   ileal ulcers, intol of entercort--refuses treatment   Emphysema of lung ([place])    GERD    History of blood  transfusion    w/ c/s surgery   History of transfusion of whole blood    HYPERLIPIDEMIA    HYPERTENSION    Microscopic hematuria    chronic   Myocardial infarction ([place]) 2010   Obstruction of intestine or colon (HCC)    adhesions   OSTEOARTHRITIS    Rectal fissure    Possible fissure   Rotator cuff tear    Takotsubo syndrome 12/2008   URINARY INCONTINENCE     Patient Active Problem List   Diagnosis Date Noted   Jaundice 09/08/2020   Aortic atherosclerosis ([place]) 05/28/2020   Degenerative joint disease of knee, left 04/26/2020   Trigger finger, right ring finger 12/09/2019   Acquired trigger finger of right middle finger 10/28/2019   Insomnia 07/23/2018   Crohn's ileitis, with intestinal obstruction ([place]) 04/24/2018   Adrenal mass, left ([place]) 04/24/2018   COPD with exacerbation ([place])    Rotator cuff arthropathy of right shoulder 04/18/2018   Trigger point of right shoulder region 08/29/2017   Encounter for chronic pain management 08/24/2017   Narcotic dependence ([place]) 05/25/2017   Smokers' cough ([place]) 02/14/2017   Crohn disease ([place]) 02/14/2017   Lumbar radiculopathy, acute 12/13/2016   Bronchitis 09/08/2016   Vitamin D deficiency 08/17/2016   Positive anti-CCP test 06/05/2016   Weight  loss 05/05/2016   Cervical disc disorder with radiculopathy of cervical region 06/08/2014   Right shoulder pain 04/20/2014   Subacromial bursitis 01/26/2014   Primary localized osteoarthrosis, lower leg 11/27/2013   Microscopic hematuria    Neck pain 01/13/2011   Cancer of ascending colon pTispN0 s/p colectomy 03/05/2009 02/18/2010   Hyperlipidemia 02/16/2010   GERD 02/16/2010   Osteoarthritis 02/16/2010   Essential hypertension 02/14/2010   Takotsubo syndrome 02/12/2009    Past Surgical History:  Procedure Laterality Date   [place]     x 3   CHOLECYSTECTOMY  1995   COLONOSCOPY  2017    COLONOSCOPY W/ BIOPSIES AND POLYPECTOMY  01/24/2011   (Crohn's)ileitis, internal hemorrhoids   ECTOPIC PREGNANCY SURGERY     ESOPHAGOGASTRODUODENOSCOPY     FACIAL COSMETIC SURGERY     HAND SURGERY Bilateral 1991   x 2   KNEE SURGERY Right 1981   LYSIS OF ADHESION  2010   ex lap/LOA for SBO Dr Excell Seltzer   ORBITAL FRACTURE SURGERY  1990   RIGHT COLECTOMY  03/2009   Right colectomy for colon CA.  Dr Donne Hazel   TONSILLECTOMY  1952   TOTAL KNEE ARTHROPLASTY  03/2010   Dr Alvan Dame.  [facility]   UPPER GASTROINTESTINAL ENDOSCOPY  05/16/2010   normal   UTERINE SUSPENSION     VIDEO BRONCHOSCOPY Bilateral 12/18/2014   Procedure: VIDEO BRONCHOSCOPY WITHOUT FLUORO;  Surgeon: Tanda Rockers, MD;  Location: [facility];  Service: Cardiopulmonary;  Laterality: Bilateral;     OB History   No obstetric history on file.     Family History  Problem Relation Age of Onset   Colon cancer Father 67   Hypertension Father    Heart disease Father    Kidney disease Father    Colon cancer Sister 79   Liver cancer Sister    Other Sister        amyloidosis   Throat cancer Mother    Arthritis Other        Parent, other relative   Stomach cancer Neg Hx    Rectal cancer Neg Hx     Social History   Tobacco Use   Smoking status: Every Day    Packs/day: 1.00    Years: 53.00    Pack years: 53.00    Types: Cigarettes   Smokeless tobacco: Never  Vaping Use   Vaping Use: Never used  Substance Use Topics   Alcohol use: Yes    Comment: occasional mixed drink   Drug use: No    Home Medications Prior to Admission medications   Medication Sig Start Date End Date Taking? Authorizing Provider  amoxicillin-clavulanate (AUGMENTIN) 875-125 MG tablet Take 1 tablet by mouth every 12 (twelve) hours. 01/05/21  Yes Sherrita Riederer, PA-C  doxycycline (VIBRAMYCIN) 100 MG capsule Take 1 capsule (100 mg total) by mouth 2 (two) times daily for 7 days. 01/06/21 01/13/21 Yes Ramzy Cappelletti, PA-C  albuterol (PROVENTIL)  (2.5 MG/3ML) 0.083% nebulizer solution Take 3 mLs (2.5 mg total) by nebulization every 6 (six) hours as needed for wheezing or shortness of breath. 09/29/20   [name], MD  albuterol (VENTOLIN HFA) 108 (90 Base) MCG/ACT inhaler INHALE 2 PUFFS BY MOUTH EVERY 4 HOURS AS NEEDED FOR WHEEZING FOR SHORTNESS OF BREATH 09/29/20   [name], MD  aspirin EC 81 MG tablet Take 81 mg by mouth daily.  [provider]  cyclobenzaprine (FLEXERIL) 10 MG tablet Take 1 tablet by mouth three times daily as needed for muscle spasm Patient taking differently: Take 10 mg by mouth as needed. 11/18/19   [name], MD  fluticasone Asencion Islam) 50 MCG/ACT nasal spray Use 2 spray(s) in each nostril once daily 10/11/20   [name], MD  HYDROcodone-acetaminophen (NORCO/VICODIN) 5-325 MG tablet Take 1 tablet by mouth 2 (two) times daily as needed for moderate pain (pain). 12/13/20   [name], MD  metoprolol tartrate (LOPRESSOR) 25 MG tablet Take 1 tablet (25 mg total) by mouth 2 (two) times daily. 11/29/20   Belva Crome, MD  predniSONE (DELTASONE) 20 MG tablet Take 2 tablets (40 mg total) by mouth daily with breakfast. 12/13/20   Lyndal Pulley, DO  rosuvastatin (CRESTOR) 10 MG tablet Take 1 tablet (10 mg total) by mouth 2 (two) times a week. Monday & Friday 11/29/20   [name], MD  Vitamin D, Ergocalciferol, (DRISDOL) 1.25 MG (50000 UNIT) CAPS capsule Take 1 capsule (50,000 Units total) by mouth every 7 (seven) days. 12/13/20   Lyndal Pulley, DO    Allergies    Morphine  Review of Systems   Review of Systems  Constitutional:  Positive for fever. Negative for chills and diaphoresis.  Respiratory:  Positive for cough, chest tightness and shortness of breath.   Cardiovascular:  Negative for palpitations and leg swelling.  Gastrointestinal:  Negative for nausea and vomiting.  All other systems reviewed and are negative.  Physical Exam Updated  Vital Signs BP (!) 154/84 (BP Location: Left Arm)   Pulse (!) 107   Temp 98.4 F (36.9 C) (Oral)   Resp 18   Ht 5' 1"  (1.549 m)   Wt 49 kg   LMP  (LMP Unknown)   SpO2 91%   BMI 20.41 kg/m   Physical Exam Vitals and nursing note reviewed.  Constitutional:      Appearance: She is not ill-appearing or diaphoretic.     Comments: Standing in room pacing  HENT:     Head: Normocephalic and atraumatic.  Eyes:     Conjunctiva/sclera: Conjunctivae normal.  Cardiovascular:     Rate and Rhythm: Regular rhythm. Tachycardia present.  Pulmonary:     Effort: Pulmonary effort is normal.     Breath sounds: Normal breath sounds. No wheezing, rhonchi or rales.     Comments: Speaking in full sentences without difficulty. Satting 95-97% on RA. LCTAB.  Abdominal:     Palpations: Abdomen is soft.     Tenderness: There is no abdominal tenderness.  Musculoskeletal:     Cervical back: Neck supple.     Right lower leg: No edema.     Left lower leg: No edema.  Skin:    General: Skin is warm and dry.  Neurological:     Mental Status: She is alert.    ED Results / Procedures / Treatments   Labs (all labs ordered are listed, but only abnormal results are displayed) Labs Reviewed  COMPREHENSIVE METABOLIC PANEL  CBC WITH DIFFERENTIAL/PLATELET  MAGNESIUM  TROPONIN I (HIGH SENSITIVITY)  TROPONIN I (HIGH SENSITIVITY)    EKG None  Radiology CT Angio Chest PE W and/or Wo Contrast  Result Date: 01/05/2021 CLINICAL DATA:  High probability of pulmonary embolus. EXAM: CT ANGIOGRAPHY CHEST WITH CONTRAST TECHNIQUE: Multidetector CT imaging of the chest was performed using the standard protocol during bolus administration of intravenous contrast. Multiplanar CT image reconstructions and MIPs  were obtained to evaluate the vascular anatomy. CONTRAST:  29m OMNIPAQUE IOHEXOL 350 MG/ML SOLN COMPARISON:  Aug 09, 2020. FINDINGS: Cardiovascular: Satisfactory opacification of the pulmonary arteries to the  segmental level. No evidence of pulmonary embolism. Normal heart size. No pericardial effusion. Atherosclerosis of thoracic aorta is noted without aneurysm. Mediastinum/Nodes: No enlarged mediastinal, hilar, or axillary lymph nodes. Thyroid gland, trachea, and esophagus demonstrate no significant findings. Lungs/Pleura: No pneumothorax or pleural effusion is noted. Right perihilar opacity is noted concerning for pneumonia. Right lower lobe opacity is noted concerning for pneumonia. Upper Abdomen: Stable left adrenal adenoma. Musculoskeletal: No chest wall abnormality. No acute or significant osseous findings. Review of the MIP images confirms the above findings. IMPRESSION: No definite evidence of pulmonary embolus. Ill-defined right perihilar and right lower lobe airspace opacities are noted concerning for multifocal pneumonia. Aortic Atherosclerosis (ICD10-I70.0). Electronically Signed   By: JMarijo ConceptionM.D.   On: 01/05/2021 19:16    Procedures Procedures   Medications Ordered in ED Medications  iohexol (OMNIPAQUE) 350 MG/ML injection 80 mL (80 mLs Intravenous Contrast Given 01/05/21 1856)    ED Course  I have reviewed the triage vital signs and the nursing notes.  Pertinent labs & imaging results that were available during my care of the patient were reviewed by me and considered in my medical decision making (see chart for details).    MDM Rules/Calculators/A&P                           70year old female who presents to the ED today for shortness of breath [place].  On arrival to the ED today patient was afebrile, mildly tachycardic at 107, nontachypneic.  Satting 92% on room air at rest.  She was seen by her sports medicine physician earlier today and advised to come to the ED for concern for PE.  She had a CTA prior to being seen which did not show any findings of PE.  She does have some ill-defined right  perihilar and right lower lobe airspace opacities concerning for multifocal pneumonia.  Her CBC is without elevation in her white blood cell count.  Hemoglobin stable at 13.7.  CMP without electrolyte abnormalities.  Magnesium of 1.7.  An EKG was ordered however has not been done.  We will plan for EKG as well as troponin given complaint of chest pain.  If troponin negative we will plan to discharge home with antibiotics for pneumonia.  I placed patient on pulse ox in the room and she stayed between 95 to 97% at rest.  We will plan to check pulse ox with ambulation.  Troponin of 5 Pt maintained O2 sats with ambulation. Will discharge home at this time with abx for pneumonia. She is in agreement with plan. Advised to follow up with PCP.   This note was prepared using Dragon voice recognition software and may include unintentional dictation errors due to the inherent limitations of voice recognition software.   Final Clinical Impression(s) / ED Diagnoses Final diagnoses:  Community acquired pneumonia of right lung, unspecified part of lung    Rx / DC Orders ED Discharge Orders          Ordered    doxycycline (VIBRAMYCIN) 100 MG capsule  2 times daily        01/06/21 0000    amoxicillin-clavulanate (AUGMENTIN) 875-125 MG tablet  Every 12 hours  01/06/21 0000             Discharge Instructions      Please pick up antibiotics and take as prescribed for your pneumonia  Follow up with your PCP regarding ED visit and for recheck. You will likely need a repeat chest xray in 4-6 weeks to ensure you have cleared the infection.   Return to the ED IMMEDIATELY for any new/worsening symptoms including worsening shortness of breath       Eustaquio Maize, PA-C 01/06/21 0001    [name], MD 01/19/21 [phone]

## 2021-01-05 NOTE — Patient Instructions (Signed)
Go to [facility] now Need work up for a PE Dr. Ethelene Hal or Wilfred Lacy Please follow up with me when you are better

## 2021-01-05 NOTE — ED Notes (Signed)
Pt states that she wanted to sit outside while waiting.

## 2021-01-05 NOTE — ED Notes (Signed)
Pt ambulatory in ED lobby.

## 2021-01-05 NOTE — ED Provider Notes (Signed)
Emergency Medicine Provider Triage Evaluation Note  Erica [name] , a 70 y.o. female  was evaluated in triage.  Pt complains of shortness of breath.  Patient has a history of COPD and states that when she visits [place] she will typically experience a COPD exacerbation.  She visited from September 17 through September 21.  States that she started prednisone prior to her trip but still developed shortness of breath.  Symptoms have been persistent since coming home.  She was reevaluated today by her PCP and was advised to come to the emergency department for PE rule out.  Denies any leg swelling or hemoptysis.  Physical Exam  BP 134/86 (BP Location: Left Arm)   Pulse (!) 107   Temp 98.4 F (36.9 C) (Oral)   Resp 18   Ht 5' 1"  (1.549 m)   Wt 49 kg   LMP  (LMP Unknown)   SpO2 92%   BMI 20.41 kg/m  Gen:   Awake, no distress   Resp:  Normal effort  MSK:   Moves extremities without difficulty  Other:    Medical Decision Making  Medically screening exam initiated at 4:19 PM.  Appropriate orders placed.  Erica [name] was informed that the remainder of the evaluation will be completed by another provider, this initial triage assessment does not replace that evaluation, and the importance of remaining in the ED until their evaluation is complete.   Erica Sexton, PA-C 01/05/21 1620    Erica Fuse, MD 01/05/21 1659

## 2021-01-05 NOTE — ED Triage Notes (Signed)
Pt reports that each time she returns home from [place] she is in respiratory distress.  States she saw her PCP who advised she come here to r/o PE.  Last trip to [place] was 9/29.  Has been on a prednisone.

## 2021-01-06 MED ORDER — AMOXICILLIN-POT CLAVULANATE 875-125 MG PO TABS: 1.0000 | ORAL_TABLET | Freq: Two times a day (BID) | ORAL | 0 refills | Status: DC

## 2021-01-06 MED ORDER — DOXYCYCLINE HYCLATE 100 MG PO CAPS: 100.0000 mg | ORAL_CAPSULE | Freq: Two times a day (BID) | ORAL | 0 refills | Status: AC

## 2021-01-06 NOTE — Discharge Instructions (Addendum)
Please pick up antibiotics and take as prescribed for your pneumonia  Follow up with your PCP regarding ED visit and for recheck. You will likely need a repeat chest xray in 4-6 weeks to ensure you have cleared the infection.   Return to the ED IMMEDIATELY for any new/worsening symptoms including worsening shortness of breath

## 2021-01-07 ENCOUNTER — Ambulatory Visit: Payer: Medicare Other

## 2021-01-10 ENCOUNTER — Telehealth: Payer: Self-pay

## 2021-01-10 MED ORDER — HYDROCODONE-ACETAMINOPHEN 5-325 MG PO TABS: 1.0000 | ORAL_TABLET | Freq: Two times a day (BID) | ORAL | 0 refills | Status: DC | PRN

## 2021-01-10 NOTE — Telephone Encounter (Signed)
Please advise as the pt has stated she needs a rx refill for HYDROcodone-acetaminophen (NORCO/VICODIN) 5-325 MG tablet   LOV: 12/07/2020.

## 2021-01-12 ENCOUNTER — Telehealth: Payer: Self-pay | Admitting: *Deleted

## 2021-01-12 DIAGNOSIS — F99 Mental disorder, not otherwise specified: Secondary | ICD-10-CM | POA: Diagnosis not present

## 2021-01-12 NOTE — Telephone Encounter (Signed)
Pt called stating she was recently seen in the ED & was dx with pneumonia. She stated that some tests were run to see if the pneumonia effected her heart but she has not received those results. Also, pt stated that when she was here last she was unable to receive the depo-medrol & toradol injections & she would like to know when she can come back in to get those injections.

## 2021-01-13 NOTE — Telephone Encounter (Signed)
Would wait until after treatment for the pneumonia

## 2021-01-19 DIAGNOSIS — F99 Mental disorder, not otherwise specified: Secondary | ICD-10-CM | POA: Diagnosis not present

## 2021-01-25 ENCOUNTER — Other Ambulatory Visit: Payer: Self-pay

## 2021-01-25 ENCOUNTER — Ambulatory Visit (INDEPENDENT_AMBULATORY_CARE_PROVIDER_SITE_OTHER): Payer: Medicare Other

## 2021-01-25 ENCOUNTER — Ambulatory Visit (HOSPITAL_COMMUNITY)
Admission: RE | Admit: 2021-01-25 | Discharge: 2021-01-25 | Disposition: A | Discharge status: Home / Self Care | Payer: Medicare Other | Source: Ambulatory Visit | Attending: Family Medicine | Admitting: Family Medicine

## 2021-01-25 ENCOUNTER — Encounter: Payer: Self-pay | Admitting: Family Medicine

## 2021-01-25 ENCOUNTER — Ambulatory Visit (INDEPENDENT_AMBULATORY_CARE_PROVIDER_SITE_OTHER): Payer: Medicare Other | Admitting: Family Medicine

## 2021-01-25 VITALS — BP 118/78 | Ht 61.0 in | Wt 107.0 lb

## 2021-01-25 DIAGNOSIS — I2510 Atherosclerotic heart disease of native coronary artery without angina pectoris: Secondary | ICD-10-CM

## 2021-01-25 DIAGNOSIS — M5412 Radiculopathy, cervical region: Secondary | ICD-10-CM | POA: Diagnosis not present

## 2021-01-25 DIAGNOSIS — R079 Chest pain, unspecified: Secondary | ICD-10-CM | POA: Diagnosis not present

## 2021-01-25 DIAGNOSIS — M5010 Cervical disc disorder with radiculopathy, unspecified cervical region: Secondary | ICD-10-CM | POA: Diagnosis not present

## 2021-01-25 DIAGNOSIS — J189 Pneumonia, unspecified organism: Secondary | ICD-10-CM | POA: Diagnosis not present

## 2021-01-25 MED ORDER — METHYLPREDNISOLONE ACETATE 40 MG/ML IJ SUSP
40.0000 mg | Freq: Once | INTRAMUSCULAR | Status: AC
  Administered 2021-01-25: 40 mg via INTRAMUSCULAR

## 2021-01-25 MED ORDER — DOXYCYCLINE HYCLATE 100 MG PO TABS
100.0000 mg | ORAL_TABLET | Freq: Two times a day (BID) | ORAL | 0 refills | Status: DC
Start: 2021-01-25 — End: 2021-03-01

## 2021-01-25 MED ORDER — KETOROLAC TROMETHAMINE 30 MG/ML IJ SOLN
30.0000 mg | Freq: Once | INTRAMUSCULAR | Status: AC
  Administered 2021-01-25: 30 mg via INTRAMUSCULAR

## 2021-01-25 NOTE — Assessment & Plan Note (Signed)
Degenerative disc disease.  Chronic problem with exacerbation.  Did not feel comfortable last time I saw patient to give her the medications and patient was in the emergency room.  Found to have more pneumonia.  Continuing to have some difficulty and is going to follow-up with primary care provider in the near future hopefully.  Given Toradol and Depo-Medrol today which will hopefully help with some of the pain and decrease in inflammation.  Did give a another prescription of the doxycycline in case patient is not completely over the potential pneumonia.  We will get the x-rays and see what it looks like before patient starts the medications.  Patient has had chronic bronchitis as well as COPD and could be more of an exacerbation.  Follow-up with me again in 6 to 8 weeks.  Total time patient 30 minutes

## 2021-01-25 NOTE — Patient Instructions (Addendum)
Xray today Please f/u with Dr. Sharlet Salina for a repeat CT scan See me again in 6-8 weeks If symptoms worsen discomfort and chest pain, please seek medical care at the emergency room

## 2021-01-25 NOTE — Progress Notes (Signed)
[place] Phone: [phone] Subjective:   [name], am serving as a scribe for Dr. Hulan Saas.  This visit occurred during the SARS-CoV-2 public health emergency.  Safety protocols were in place, including screening questions prior to the visit, additional usage of staff PPE, and extensive cleaning of exam room while observing appropriate contact time as indicated for disinfecting solutions.   I'm seeing this patient by the request  of:  [name], MD  CC: Neck pain follow-up  [id number]  01/05/2021 There is a good chance that patient is having just a COPD exacerbation but patient is seeming to be more short of breath than her baseline.  In addition to this patient has had a travel history and patient is at an increased risk of potential pulmonary embolism.  Discussed with patient that we do sports medicine and do feel that she should be further evaluated by another provider.  At this point secondary to how long it is going and patient stating that it is getting worse I would think that possibly being evaluated at the emergency room would be beneficial.  Patient does need a CT scan likely to rule out pulmonary embolism.  Chest x-ray would also be helpful to rule out such things as a COPD exacerbation or the possibility of pneumonia.  Patient is in agreement with the plan and will go over there herself.  Do believe that patient is okay to transfer self at this time.  Update 01/25/2021 Kyiesha Millward is a 70 y.o. female coming in with complaint of neck pain. Patient was seen in ED for pneumonia since last visit. Patient states that she is in pain due to not getting the injection last visit.  Patient states that he is having increasing neck pain.  Still does not feel like she is over the pneumonia.  The patient has been off the antibiotics still for some time.  Having to use her inhaler on a more  regular basis.  Has been taking Benadryl to try to help with clearing of the mucus but not noticing significant improvement.       Past Medical History:  Diagnosis Date   Acute duodenitis 04/24/2017   Allergy    ANEMIA    Anxiety    BACK PAIN, LUMBAR    Cancer of ascending colon pTispN0 s/p colectomy 03/05/2009 02/18/2010   Qualifier: Diagnosis of  By: Carlean Purl MD, Tonna Boehringer E    CHF (congestive heart failure) ([place])    Complete rotator cuff tear of left shoulder 11/27/2013   Ultrasound guided injection on November 27, 2013    COPD (chronic obstructive pulmonary disease) with emphysema ([place])    COPD exacerbation ([place]) 11/14/2017   Crohn's  02/2010   ileal ulcers, intol of entercort--refuses treatment   Emphysema of lung ([place])    GERD    History of blood transfusion    w/ c/s surgery   History of transfusion of whole blood    HYPERLIPIDEMIA    HYPERTENSION    Microscopic hematuria    chronic   Myocardial infarction ([place]) 2010   Obstruction of intestine or colon ([place])    adhesions   OSTEOARTHRITIS    Rectal fissure    Possible fissure   Rotator cuff tear    Takotsubo syndrome 12/2008   URINARY INCONTINENCE    Past Surgical History:  Procedure Laterality Date   [place]  BLADDER SUSPENSION     CESAREAN SECTION     x 3   CHOLECYSTECTOMY  1995   COLONOSCOPY  2017   COLONOSCOPY W/ BIOPSIES AND POLYPECTOMY  01/24/2011   (Crohn's)ileitis, internal hemorrhoids   ECTOPIC PREGNANCY SURGERY     ESOPHAGOGASTRODUODENOSCOPY     FACIAL COSMETIC SURGERY     HAND SURGERY Bilateral 1991   x 2   KNEE SURGERY Right 1981   LYSIS OF ADHESION  2010   ex lap/LOA for SBO Dr Excell Seltzer   ORBITAL FRACTURE SURGERY  1990   RIGHT COLECTOMY  03/2009   Right colectomy for colon CA.  Dr Donne Hazel   TONSILLECTOMY  1952   TOTAL KNEE ARTHROPLASTY  03/2010   Dr Alvan Dame.  Depuy   UPPER GASTROINTESTINAL ENDOSCOPY  05/16/2010   normal   UTERINE SUSPENSION      VIDEO BRONCHOSCOPY Bilateral 12/18/2014   Procedure: VIDEO BRONCHOSCOPY WITHOUT FLUORO;  Surgeon: Tanda Rockers, MD;  Location: [facility];  Service: Cardiopulmonary;  Laterality: Bilateral;   Social History   Socioeconomic History   Marital status: Widowed    Spouse name: Not on file   Number of children: 5   Years of education: 14   Highest education level: High school graduate  Occupational History   Occupation: Retired     Fish farm manager: UNEMPLOYED  Tobacco Use   Smoking status: Every Day    Packs/day: 1.00    Years: 53.00    Pack years: 53.00    Types: Cigarettes   Smokeless tobacco: Never  Vaping Use   Vaping Use: Never used  Substance and Sexual Activity   Alcohol use: Yes    Comment: occasional mixed drink   Drug use: No   Sexual activity: Not Currently    Birth control/protection: Surgical    Comment: Hysterectomy  Other Topics Concern   Not on file  Social History Narrative   Widowed; has five children, 8 grandchildren and 4 great-grandchildren.   Social Determinants of Health   Financial Resource Strain: Not on file  Food Insecurity: Not on file  Transportation Needs: Not on file  Physical Activity: Not on file  Stress: Not on file  Social Connections: Not on file   Allergies  Allergen Reactions   Morphine Nausea And Vomiting   Family History  Problem Relation Age of Onset   Colon cancer Father 81   Hypertension Father    Heart disease Father    Kidney disease Father    Colon cancer Sister 60   Liver cancer Sister    Other Sister        amyloidosis   Throat cancer Mother    Arthritis Other        Parent, other relative   Stomach cancer Neg Hx    Rectal cancer Neg Hx     Current Outpatient Medications (Endocrine & Metabolic):    predniSONE (DELTASONE) 20 MG tablet, Take 2 tablets (40 mg total) by mouth daily with breakfast.  Current Outpatient Medications (Cardiovascular):    metoprolol tartrate (LOPRESSOR) 25 MG tablet, Take 1 tablet (25 mg  total) by mouth 2 (two) times daily.   rosuvastatin (CRESTOR) 10 MG tablet, Take 1 tablet (10 mg total) by mouth 2 (two) times a week. Monday & Friday  Current Outpatient Medications (Respiratory):    albuterol (PROVENTIL) (2.5 MG/3ML) 0.083% nebulizer solution, Take 3 mLs (2.5 mg total) by nebulization every 6 (six) hours as needed for wheezing or shortness of breath.   albuterol (VENTOLIN HFA)  108 (90 Base) MCG/ACT inhaler, INHALE 2 PUFFS BY MOUTH EVERY 4 HOURS AS NEEDED FOR WHEEZING FOR SHORTNESS OF BREATH   fluticasone (FLONASE) 50 MCG/ACT nasal spray, Use 2 spray(s) in each nostril once daily  Current Outpatient Medications (Analgesics):    aspirin EC 81 MG tablet, Take 81 mg by mouth daily.   HYDROcodone-acetaminophen (NORCO/VICODIN) 5-325 MG tablet, Take 1 tablet by mouth 2 (two) times daily as needed for moderate pain (pain).   Current Outpatient Medications (Other):    amoxicillin-clavulanate (AUGMENTIN) 875-125 MG tablet, Take 1 tablet by mouth every 12 (twelve) hours.   cyclobenzaprine (FLEXERIL) 10 MG tablet, Take 1 tablet by mouth three times daily as needed for muscle spasm (Patient taking differently: Take 10 mg by mouth as needed.)   doxycycline (VIBRA-TABS) 100 MG tablet, Take 1 tablet (100 mg total) by mouth 2 (two) times daily.   Vitamin D, Ergocalciferol, (DRISDOL) 1.25 MG (50000 UNIT) CAPS capsule, Take 1 capsule (50,000 Units total) by mouth every 7 (seven) days.   Reviewed prior external information including notes and imaging from  primary care provider As well as notes that were available from care everywhere and other healthcare systems.  Reviewed patient's most recent ED visit including the CT scan that was unremarkable for pneumonia.  Reviewed suggestions as well from that that likely needs a study done again in the next 4 weeks.  Patient is scheduled with primary care provider.  Past medical history, social, surgical and family history all reviewed in electronic  medical record.  No pertanent information unless stated regarding to the chief complaint.   Review of Systems:  No headache, visual changes, nausea, vomiting, diarrhea, constipation, dizziness, abdominal pain, skin rash, fevers, chills, night sweats, weight loss, swollen lymph nodes, body aches, joint swelling, chest pain, , mood changes. POSITIVE muscle aches, body aches, shortness of breath  Objective  Blood pressure 118/78, height 5' 1"  (1.549 m), weight 107 lb (48.5 kg).   General: No apparent distress alert and oriented x3 mood and affect normal, dressed appropriately.  HEENT: Pupils equal, extraocular movements intact  Respiratory: Patient's speak in full sentences he does have a cough noted.  Patient does have limited air movement though noted in the left lower lobe.  Patient does have rhonchi noted everywhere else. Cardiovascular: No lower extremity edema, non tender, no erythema  Gait normal with good balance and coordination.  MSK: Neck exam does have significant loss of lordosis.  Significant tenderness to palpation in multiple different areas of the body.  Patient appears to be uncomfortable.  Crepitus of the neck noted with range of motion.    Impression and Recommendations:    The above documentation has been reviewed and is accurate and complete Lyndal Pulley, DO

## 2021-01-26 ENCOUNTER — Telehealth: Payer: Self-pay | Admitting: Family Medicine

## 2021-01-26 NOTE — Telephone Encounter (Signed)
Left message that she can take medication if she wants to be safe but he did not see any signs of infection.

## 2021-01-26 NOTE — Telephone Encounter (Signed)
Pt is having trouble with her MyChart and is not up for dealing with it.  Please CALL patient with imaging results from yesterday's visit. She is awaiting results before starting the Doxy.

## 2021-02-02 DIAGNOSIS — F99 Mental disorder, not otherwise specified: Secondary | ICD-10-CM | POA: Diagnosis not present

## 2021-02-08 ENCOUNTER — Telehealth: Payer: Self-pay | Admitting: Internal Medicine

## 2021-02-08 MED ORDER — HYDROCODONE-ACETAMINOPHEN 5-325 MG PO TABS: 1.0000 | ORAL_TABLET | Freq: Two times a day (BID) | ORAL | 0 refills | Status: DC | PRN

## 2021-02-08 NOTE — Telephone Encounter (Signed)
See below

## 2021-02-08 NOTE — Telephone Encounter (Signed)
1.Medication Requested: HYDROcodone-acetaminophen (NORCO/VICODIN) 5-325 MG tablet 2. Pharmacy (Name, Street, [place]): [facility], [place] Phone:  [phone]  Fax:  [phone]     3. On Med List: Y  4. Last Visit with PCP: 12/07/2020  5. Next visit date with PCP:03/01/2021   Agent: Please be advised that RX refills may take up to 3 business days. We ask that you follow-up with your pharmacy.

## 2021-02-08 NOTE — Addendum Note (Signed)
Addended by: Pricilla Holm A on: 02/08/2021 01:55 PM   Modules accepted: Orders

## 2021-02-15 ENCOUNTER — Telehealth: Payer: Self-pay | Admitting: Internal Medicine

## 2021-02-15 NOTE — Telephone Encounter (Signed)
Are there any available apts anywhere today? I would recommend she be seen today with anyone.

## 2021-02-15 NOTE — Telephone Encounter (Signed)
Pt. Connected to [facility].    Caller is having respiratory issues along with COPD and needing to be seen today -- she's having sharp pain in her right ear -- states she's having shortness of breath.   She's having sharp pain in both ears and states she's having shortness of breath and chest pressure. Two covid tests were negative. She also has sore throat. Inhaler and nebulizer are not helping much. S/ S started about a week ago. She does have allergies & antihistamines aren't helping. She is also having shortness of breath just sitting. Caller states she has a low-grade fever.   Advised to see HCP within 4 hours.

## 2021-02-15 NOTE — Telephone Encounter (Signed)
Patient states triage nurse recommended her to be seen in the next 4 hrs  Informed patient provider did not have an ov for that time frame; offered patient another appt w/ another provider  Patient declined the appoint stating she only wanted to see her provider  No information on symptoms was released by patient

## 2021-02-15 NOTE — Telephone Encounter (Signed)
No note needed 

## 2021-02-15 NOTE — Telephone Encounter (Signed)
Spoke with pt and was able to inform her of Dr. Nathanial Millman and offer her to be seen in office at 3:40 with Dr. Alain Marion. However, pt refused and stated she want's to be seen by her doctor only and don't have time to explain anything t another doctor.

## 2021-02-15 NOTE — Telephone Encounter (Signed)
I will need information about symptoms to make a clinical decision can we get that from [facility] or patient.

## 2021-02-16 DIAGNOSIS — F99 Mental disorder, not otherwise specified: Secondary | ICD-10-CM | POA: Diagnosis not present

## 2021-02-21 ENCOUNTER — Ambulatory Visit: Payer: Medicare Other | Admitting: Internal Medicine

## 2021-03-01 ENCOUNTER — Other Ambulatory Visit: Payer: Self-pay

## 2021-03-01 ENCOUNTER — Ambulatory Visit (INDEPENDENT_AMBULATORY_CARE_PROVIDER_SITE_OTHER): Payer: Medicare Other | Admitting: Internal Medicine

## 2021-03-01 ENCOUNTER — Encounter: Payer: Self-pay | Admitting: Internal Medicine

## 2021-03-01 DIAGNOSIS — J441 Chronic obstructive pulmonary disease with (acute) exacerbation: Secondary | ICD-10-CM

## 2021-03-01 DIAGNOSIS — I2510 Atherosclerotic heart disease of native coronary artery without angina pectoris: Secondary | ICD-10-CM

## 2021-03-01 DIAGNOSIS — J410 Simple chronic bronchitis: Secondary | ICD-10-CM

## 2021-03-01 DIAGNOSIS — F1120 Opioid dependence, uncomplicated: Secondary | ICD-10-CM

## 2021-03-01 MED ORDER — UMECLIDINIUM-VILANTEROL 62.5-25 MCG/ACT IN AEPB: 1.0000 | INHALATION_SPRAY | Freq: Every day | RESPIRATORY_TRACT | 6 refills | Status: DC

## 2021-03-01 MED ORDER — PREDNISONE 20 MG PO TABS: 40.0000 mg | ORAL_TABLET | Freq: Every day | ORAL | 0 refills | Status: DC

## 2021-03-01 NOTE — Progress Notes (Signed)
   Subjective:   Patient ID: Erica [name], female    DOB: 26-Nov-1950, 70 y.o.   MRN: [id number]  HPI The patient is a 70 YO female coming in for follow up and new breathing problems. Did have this the last month but did not want to come in due to having to see a different provider so she ended up at the ER. Using albuterol very often and inhaler lasting 16 days.  Review of Systems  Constitutional:  Positive for activity change and appetite change.  HENT: Negative.    Eyes: Negative.   Respiratory:  Positive for cough and shortness of breath. Negative for chest tightness.   Cardiovascular:  Negative for chest pain, palpitations and leg swelling.  Gastrointestinal:  Negative for abdominal distention, abdominal pain, constipation, diarrhea, nausea and vomiting.  Musculoskeletal:  Positive for arthralgias and myalgias.  Skin: Negative.   Neurological: Negative.   Psychiatric/Behavioral: Negative.     Objective:  Physical Exam Constitutional:      Appearance: She is well-developed.  HENT:     Head: Normocephalic and atraumatic.  Cardiovascular:     Rate and Rhythm: Normal rate and regular rhythm.  Pulmonary:     Effort: Pulmonary effort is normal. No respiratory distress.     Breath sounds: Rhonchi present. No wheezing or rales.     Comments: Poor air movement but no dyspnea with talking or walking Abdominal:     General: Bowel sounds are normal. There is no distension.     Palpations: Abdomen is soft.     Tenderness: There is no abdominal tenderness. There is no rebound.  Musculoskeletal:        General: Tenderness present.     Cervical back: Normal range of motion.  Skin:    General: Skin is warm and dry.  Neurological:     Mental Status: She is alert and oriented to person, place, and time.     Coordination: Coordination normal.    Vitals:   03/01/21 0919  BP: 124/70  Pulse: 89  Resp: 18  SpO2: 90%  Weight: 112 lb 6.4 oz (51 kg)  Height: 5' 1"  (1.549 m)     This visit occurred during the SARS-CoV-2 public health emergency.  Safety protocols were in place, including screening questions prior to the visit, additional usage of staff PPE, and extensive cleaning of exam room while observing appropriate contact time as indicated for disinfecting solutions.   Assessment & Plan:  Visit time 25 minutes in face to face communication with patient and coordination of care, additional 10 minutes spent in record review, coordination or care, ordering tests, communicating/referring to other healthcare professionals, documenting in medical records all on the same day of the visit for total time 35 minutes spent on the visit.

## 2021-03-01 NOTE — Patient Instructions (Addendum)
We have sent in the anoro to take 1 puff daily everyday.   You can still use the albuterol inhaler when needed when you can't breathe.  We will send in the prednisone to take 2 pills daily for 1 week to help the breathing.  Work on stopping smoking if you can as your lungs are getting to the point where they are having a lot of problems.

## 2021-03-02 DIAGNOSIS — F99 Mental disorder, not otherwise specified: Secondary | ICD-10-CM | POA: Diagnosis not present

## 2021-03-02 NOTE — Assessment & Plan Note (Signed)
Overall lung function is worsening. She is asked to make attempt to quit.

## 2021-03-02 NOTE — Assessment & Plan Note (Signed)
Rx prednisone for possible exacerbation versus worsening lung disease. She is still smoking and asked to stop. She needs controller medication as she is dyspneic all the time and using albuterol very often. Recent CXR reviewed and no pneumonia. She will continue albuterol nebulizer and inhaler as needed. Rx anoro to see if this can help with her chronic SOB. Advised to quit smoking.

## 2021-03-02 NOTE — Assessment & Plan Note (Signed)
She is also here for follow up chronic pain. Using hydrocodone/APAP 5/325 mg BID prn pain. Last UDS appropriate. [place] narcotic database reviewed and appropriate. Refill when due. Will continue with visits every 3 months.

## 2021-03-07 NOTE — Progress Notes (Signed)
Erica [name] [address] [place] Phone: [phone] Subjective:   Erica [name], am serving as a scribe for Dr. Hulan Saas. This visit occurred during the SARS-CoV-2 public health emergency.  Safety protocols were in place, including screening questions prior to the visit, additional usage of staff PPE, and extensive cleaning of exam room while observing appropriate contact time as indicated for disinfecting solutions.   I'm seeing this patient by the request  of:  [name], MD  CC: Neck pain follow-up  VOZ:[id number]  01/25/2021 Degenerative disc disease.  Chronic problem with exacerbation.  Did not feel comfortable last time I saw patient to give her the medications and patient was in the emergency room.  Found to have more pneumonia.  Continuing to have some difficulty and is going to follow-up with primary care provider in the near future hopefully.  Given Toradol and Depo-Medrol today which will hopefully help with some of the pain and decrease in inflammation.  Did give a another prescription of the doxycycline in case patient is not completely over the potential pneumonia.  We will get the x-rays and see what it looks like before patient starts the medications.  Patient has had chronic bronchitis as well as COPD and could be more of an exacerbation.  Follow-up with me again in 6 to 8 weeks.  Total time patient 30 minutes  Update 03/08/2021 Erica [name] is a 70 y.o. female coming in with complaint of cervical spine pain. Patient states second boost has lots of symptoms that have been ongoing for months. The right knee is hurting but the left is the worse. Neck pain has gotten a lot better since increase in medication.  Patient complains of them but does not want to do anything about the knees today.  Patient states that she still is having a lot of shortness of breath.  Patient recently has been getting prednisone by primary  care provider.  Does not know if it made any significant improvement.  Patient is just concerned because she still does not feel like herself.       Past Medical History:  Diagnosis Date   Acute duodenitis 04/24/2017   Allergy    ANEMIA    Anxiety    BACK PAIN, LUMBAR    Cancer of ascending colon pTispN0 s/p colectomy 03/05/2009 02/18/2010   Qualifier: Diagnosis of  By: Carlean Purl MD, Tonna Boehringer E    CHF (congestive heart failure) ([place])    Complete rotator cuff tear of left shoulder 11/27/2013   Ultrasound guided injection on November 27, 2013    COPD (chronic obstructive pulmonary disease) with emphysema ([place])    COPD exacerbation ([place]) 11/14/2017   Crohn's  02/2010   ileal ulcers, intol of entercort--refuses treatment   Emphysema of lung ([place])    GERD    History of blood transfusion    w/ c/s surgery   History of transfusion of whole blood    HYPERLIPIDEMIA    HYPERTENSION    Microscopic hematuria    chronic   Myocardial infarction ([place]) 2010   Obstruction of intestine or colon ([place])    adhesions   OSTEOARTHRITIS    Rectal fissure    Possible fissure   Rotator cuff tear    Takotsubo syndrome 12/2008   URINARY INCONTINENCE    Past Surgical History:  Procedure Laterality Date   [place]  CESAREAN SECTION     x 3   CHOLECYSTECTOMY  1995   COLONOSCOPY  2017   COLONOSCOPY W/ BIOPSIES AND POLYPECTOMY  01/24/2011   (Crohn's)ileitis, internal hemorrhoids   ECTOPIC PREGNANCY SURGERY     ESOPHAGOGASTRODUODENOSCOPY     FACIAL COSMETIC SURGERY     HAND SURGERY Bilateral 1991   x 2   KNEE SURGERY Right 1981   LYSIS OF ADHESION  2010   ex lap/LOA for SBO Dr Excell Seltzer   ORBITAL FRACTURE SURGERY  1990   RIGHT COLECTOMY  03/2009   Right colectomy for colon CA.  Dr Donne Hazel   TONSILLECTOMY  1952   TOTAL KNEE ARTHROPLASTY  03/2010   Dr Alvan Dame.  Depuy   UPPER GASTROINTESTINAL ENDOSCOPY  05/16/2010   normal    UTERINE SUSPENSION     VIDEO BRONCHOSCOPY Bilateral 12/18/2014   Procedure: VIDEO BRONCHOSCOPY WITHOUT FLUORO;  Surgeon: Tanda Rockers, MD;  Location: [facility];  Service: Cardiopulmonary;  Laterality: Bilateral;   Social History   Socioeconomic History   Marital status: Widowed    Spouse name: Not on file   Number of children: 5   Years of education: 14   Highest education level: High school graduate  Occupational History   Occupation: Retired     Fish farm manager: UNEMPLOYED  Tobacco Use   Smoking status: Every Day    Packs/day: 1.00    Years: 53.00    Pack years: 53.00    Types: Cigarettes   Smokeless tobacco: Never  Vaping Use   Vaping Use: Never used  Substance and Sexual Activity   Alcohol use: Yes    Comment: occasional mixed drink   Drug use: No   Sexual activity: Not Currently    Birth control/protection: Surgical    Comment: Hysterectomy  Other Topics Concern   Not on file  Social History Narrative   Widowed; has five children, 8 grandchildren and 4 great-grandchildren.   Social Determinants of Health   Financial Resource Strain: Not on file  Food Insecurity: Not on file  Transportation Needs: Not on file  Physical Activity: Not on file  Stress: Not on file  Social Connections: Not on file   Allergies  Allergen Reactions   Morphine Nausea And Vomiting   Family History  Problem Relation Age of Onset   Colon cancer Father 43   Hypertension Father    Heart disease Father    Kidney disease Father    Colon cancer Sister 71   Liver cancer Sister    Other Sister        amyloidosis   Throat cancer Mother    Arthritis Other        Parent, other relative   Stomach cancer Neg Hx    Rectal cancer Neg Hx     Current Outpatient Medications (Endocrine & Metabolic):    predniSONE (DELTASONE) 20 MG tablet, Take 2 tablets (40 mg total) by mouth daily with breakfast.  Current Outpatient Medications (Cardiovascular):    metoprolol tartrate (LOPRESSOR) 25 MG  tablet, Take 1 tablet (25 mg total) by mouth 2 (two) times daily.   rosuvastatin (CRESTOR) 10 MG tablet, Take 1 tablet (10 mg total) by mouth 2 (two) times a week. Monday & Friday  Current Outpatient Medications (Respiratory):    albuterol (PROVENTIL) (2.5 MG/3ML) 0.083% nebulizer solution, Take 3 mLs (2.5 mg total) by nebulization every 6 (six) hours as needed for wheezing or shortness of breath.   albuterol (VENTOLIN HFA) 108 (90 Base) MCG/ACT inhaler, INHALE  2 PUFFS BY MOUTH EVERY 4 HOURS AS NEEDED FOR WHEEZING FOR SHORTNESS OF BREATH   fluticasone (FLONASE) 50 MCG/ACT nasal spray, Use 2 spray(s) in each nostril once daily   umeclidinium-vilanterol (ANORO ELLIPTA) 62.5-25 MCG/ACT AEPB, Inhale 1 puff into the lungs daily at 6 (six) AM.  Current Outpatient Medications (Analgesics):    aspirin EC 81 MG tablet, Take 81 mg by mouth daily.   HYDROcodone-acetaminophen (NORCO/VICODIN) 5-325 MG tablet, Take 1 tablet by mouth 2 (two) times daily as needed for moderate pain (pain).   Current Outpatient Medications (Other):    cyclobenzaprine (FLEXERIL) 10 MG tablet, Take 1 tablet by mouth three times daily as needed for muscle spasm (Patient taking differently: Take 10 mg by mouth as needed.)   Vitamin D, Ergocalciferol, (DRISDOL) 1.25 MG (50000 UNIT) CAPS capsule, Take 1 capsule (50,000 Units total) by mouth every 7 (seven) days.    Review of Systems:  No headache, visual changes, nausea, vomiting, diarrhea, constipation, dizziness, abdominal pain, skin rash, fevers, chills, night sweats, weight loss, swollen lymph nodes, , mood changes. POSITIVE muscle aches, body aches, joint swelling, fatigue, shortness of breath and chest discomfort  Objective  Blood pressure 140/90, pulse 65, height 5' 1"  (1.549 m), weight 117 lb (53.1 kg), SpO2 95 %.   General: No apparent distress alert and oriented x3 mood and affect normal, dressed appropriately.  Cachectic HEENT: Pupils equal, extraocular movements  intact  Respiratory: Patient's speak in full sentences patient does have mild labored breathing.  Encourage patient to continue to back off on her smoking. Cardiovascular: No lower extremity edema, non tender, no erythema  Gait antalgic MSK: Patient does have arthritic changes of multiple joints.  He does have the replacement of the knee.  Patient does have tenderness diffusely of the upper back as well.  Still limited range of motion of the neck but is significantly less tender than previous exam. Patient does appears to be uncomfortable.   Impression and Recommendations:    The above documentation has been reviewed and is accurate and complete Lyndal Pulley, DO

## 2021-03-08 ENCOUNTER — Telehealth: Payer: Self-pay | Admitting: Internal Medicine

## 2021-03-08 ENCOUNTER — Other Ambulatory Visit: Payer: Self-pay

## 2021-03-08 ENCOUNTER — Encounter: Payer: Self-pay | Admitting: Family Medicine

## 2021-03-08 ENCOUNTER — Ambulatory Visit (INDEPENDENT_AMBULATORY_CARE_PROVIDER_SITE_OTHER): Payer: Medicare Other | Admitting: Family Medicine

## 2021-03-08 DIAGNOSIS — M5010 Cervical disc disorder with radiculopathy, unspecified cervical region: Secondary | ICD-10-CM

## 2021-03-08 DIAGNOSIS — E278 Other specified disorders of adrenal gland: Secondary | ICD-10-CM

## 2021-03-08 DIAGNOSIS — E559 Vitamin D deficiency, unspecified: Secondary | ICD-10-CM | POA: Diagnosis not present

## 2021-03-08 DIAGNOSIS — J410 Simple chronic bronchitis: Secondary | ICD-10-CM | POA: Diagnosis not present

## 2021-03-08 DIAGNOSIS — J441 Chronic obstructive pulmonary disease with (acute) exacerbation: Secondary | ICD-10-CM

## 2021-03-08 DIAGNOSIS — I2510 Atherosclerotic heart disease of native coronary artery without angina pectoris: Secondary | ICD-10-CM

## 2021-03-08 MED ORDER — VITAMIN D (ERGOCALCIFEROL) 1.25 MG (50000 UNIT) PO CAPS: 50000.0000 [IU] | ORAL_CAPSULE | ORAL | 0 refills | Status: DC

## 2021-03-08 NOTE — Assessment & Plan Note (Signed)
Patient has had COPD.  Encouraged her to follow-up with a pulmonologist.  States that she continues to have difficulty with getting her oxygen saturation at home.  He does states that she was short of breath.  We discussed worsening chest pain to seek medical attention immediately.  Do feel that even a sleep study could be beneficial in this individual.

## 2021-03-08 NOTE — Assessment & Plan Note (Signed)
Patient has responded well to the once weekly and would like to progress.  I think for the winter is not a bad idea do think that labs will be necessary at 3 months.

## 2021-03-08 NOTE — Assessment & Plan Note (Signed)
Encourage patient to continue to try to decrease the amount she is smoking.  Also encouraged her to follow-up with pulmonology again.

## 2021-03-08 NOTE — Assessment & Plan Note (Signed)
Encourage patient to ask primary again to see if any imaging is necessary.  Otherwise CT abdomen pelvis could be beneficial.  Patient likely has gained 5 pounds since our previous visit.

## 2021-03-08 NOTE — Patient Instructions (Signed)
Highly suggest you see pulmonology to discuss breathing and sleep study I'll ask Dr. Sharlet Salina what she thinks about another CT about adrenal glands and will leave that in her hands We can see you again in 6 weeks if injections help If worsening symptoms go to emergency room immediately

## 2021-03-08 NOTE — Telephone Encounter (Signed)
See below

## 2021-03-08 NOTE — Assessment & Plan Note (Signed)
Patient did have an exacerbation last time but is feeling better.  Patient did respond extremely well to the Toradol and Depo-Medrol.  Follow-up with me again in 6 weeks and can repeat if necessary.

## 2021-03-08 NOTE — Telephone Encounter (Signed)
1.Medication Requested: HYDROcodone-acetaminophen (NORCO/VICODIN) 5-325 MG tablet  2. Pharmacy (Name, Street, [place]): [facility], [address]  3. On Med List: yes   4. Last Visit with PCP: 11-29  5. Next visit date with PCP: n/a   Agent: Please be advised that RX refills may take up to 3 business days. We ask that you follow-up with your pharmacy.

## 2021-03-09 DIAGNOSIS — F99 Mental disorder, not otherwise specified: Secondary | ICD-10-CM | POA: Diagnosis not present

## 2021-03-11 ENCOUNTER — Telehealth: Payer: Self-pay | Admitting: Family Medicine

## 2021-03-11 MED ORDER — HYDROCODONE-ACETAMINOPHEN 5-325 MG PO TABS: 1.0000 | ORAL_TABLET | Freq: Two times a day (BID) | ORAL | 0 refills | Status: DC | PRN

## 2021-03-11 NOTE — Addendum Note (Signed)
Addended by: Pricilla Holm A on: 03/11/2021 03:40 PM   Modules accepted: Orders

## 2021-03-11 NOTE — Telephone Encounter (Signed)
Patient called asking the name and address of the preferred provider that Dr [name] talked to her about. She said that even if it was farther away, she would rather go there and would like to make a new patient appointment.  She did not wish to tell me what is was for and said that Dr [name] was aware.  Please advise.

## 2021-03-11 NOTE — Telephone Encounter (Signed)
See below

## 2021-03-11 NOTE — Telephone Encounter (Signed)
Patient calling to check status of refill request

## 2021-03-15 DIAGNOSIS — F99 Mental disorder, not otherwise specified: Secondary | ICD-10-CM | POA: Diagnosis not present

## 2021-03-15 NOTE — Telephone Encounter (Signed)
I would have her look at the [facility] location based on her address.  Tell her all providers there would be ok

## 2021-03-15 NOTE — Telephone Encounter (Signed)
Spoke to patient and she is aware.

## 2021-04-06 DIAGNOSIS — F99 Mental disorder, not otherwise specified: Secondary | ICD-10-CM | POA: Diagnosis not present

## 2021-04-07 ENCOUNTER — Telehealth: Payer: Self-pay | Admitting: Internal Medicine

## 2021-04-07 NOTE — Telephone Encounter (Signed)
1.Medication Requested: HYDROcodone-acetaminophen (NORCO/VICODIN) 5-325 MG tablet  2. Pharmacy (Name, Street, [place]): [facility], [place]  Phone:  [phone] Fax:  [phone]   3. On Med List: yes  4. Last Visit with PCP: 11.29.22  5. Next visit date with PCP: n/a   Agent: Please be advised that RX refills may take up to 3 business days. We ask that you follow-up with your pharmacy.

## 2021-04-08 MED ORDER — HYDROCODONE-ACETAMINOPHEN 5-325 MG PO TABS: 1.0000 | ORAL_TABLET | Freq: Two times a day (BID) | ORAL | 0 refills | Status: DC | PRN

## 2021-04-13 DIAGNOSIS — F99 Mental disorder, not otherwise specified: Secondary | ICD-10-CM | POA: Diagnosis not present

## 2021-04-18 NOTE — Progress Notes (Signed)
[place] Phone: [phone] Subjective:   [name], am serving as a scribe for Dr. Hulan Saas. This visit occurred during the SARS-CoV-2 public health emergency.  Safety protocols were in place, including screening questions prior to the visit, additional usage of staff PPE, and extensive cleaning of exam room while observing appropriate contact time as indicated for disinfecting solutions.  I'm seeing this patient by the request  of:  [name], MD  CC: Neck pain and allover pain follow-up  [id number]  03/08/2021 Patient did have an exacerbation last time but is feeling better.  Patient did respond extremely well to the Toradol and Depo-Medrol.  Follow-up with me again in 6 weeks and can repeat if necessary.  Patient has responded well to the once weekly and would like to progress.  I think for the winter is not a bad idea do think that labs will be necessary at 3 months.  Patient has had COPD.  Encouraged her to follow-up with a pulmonologist.  States that she continues to have difficulty with getting her oxygen saturation at home.  He does states that she was short of breath.  We discussed worsening chest pain to seek medical attention immediately.  Do feel that even a sleep study could be beneficial in this individual.  Encourage patient to continue to try to decrease the amount she is smoking.  Also encouraged her to follow-up with pulmonology again.  Update 04/19/2021 Emara Lichter is a 71 y.o. female coming in with complaint of neck pain. Epidural June 2022. Patient states that she does have relief from IM injections. Would like those today to help with her pain. States that she has residual pain in neck and shoulder after vaccination.  Patient still states that this is caused her to continue to have pain and weakness overall.  Still has fatigue.  Also complaining of nodule on R hand  between 3rd and 4th metacarpals.  Having locking of the fingers.  Has had trigger finger previously on the right.      Past Medical History:  Diagnosis Date   Acute duodenitis 04/24/2017   Allergy    ANEMIA    Anxiety    BACK PAIN, LUMBAR    Cancer of ascending colon [id number] s/p colectomy 03/05/2009 02/18/2010   Qualifier: Diagnosis of  By: Carlean Purl MD, Tonna Boehringer E    CHF (congestive heart failure) ([place])    Complete rotator cuff tear of left shoulder 11/27/2013   Ultrasound guided injection on November 27, 2013    COPD (chronic obstructive pulmonary disease) with emphysema ([place])    COPD exacerbation ([place]) 11/14/2017   Crohn's  02/2010   ileal ulcers, intol of entercort--refuses treatment   Emphysema of lung ([place])    GERD    History of blood transfusion    w/ c/s surgery   History of transfusion of whole blood    HYPERLIPIDEMIA    HYPERTENSION    Microscopic hematuria    chronic   Myocardial infarction ([place]) 2010   Obstruction of intestine or colon ([place])    adhesions   OSTEOARTHRITIS    Rectal fissure    Possible fissure   Rotator cuff tear    Takotsubo syndrome 12/2008   URINARY INCONTINENCE    Past Surgical History:  Procedure Laterality Date   ABDOMINAL HYSTERECTOMY  [place]  x 3   CHOLECYSTECTOMY  1995   COLONOSCOPY  2017   COLONOSCOPY W/ BIOPSIES AND POLYPECTOMY  01/24/2011   (Crohn's)ileitis, internal hemorrhoids   ECTOPIC PREGNANCY SURGERY     ESOPHAGOGASTRODUODENOSCOPY     FACIAL COSMETIC SURGERY     HAND SURGERY Bilateral 1991   x 2   KNEE SURGERY Right 1981   LYSIS OF ADHESION  2010   ex lap/LOA for SBO Dr Excell Seltzer   ORBITAL FRACTURE SURGERY  1990   RIGHT COLECTOMY  03/2009   Right colectomy for colon CA.  Dr Donne Hazel   TONSILLECTOMY  1952   TOTAL KNEE ARTHROPLASTY  03/2010   Dr Alvan Dame.  [place]   UPPER GASTROINTESTINAL ENDOSCOPY  05/16/2010   normal   UTERINE SUSPENSION     VIDEO  BRONCHOSCOPY Bilateral 12/18/2014   Procedure: VIDEO BRONCHOSCOPY WITHOUT FLUORO;  Surgeon: Tanda Rockers, MD;  Location: [facility];  Service: Cardiopulmonary;  Laterality: Bilateral;   Social History   Socioeconomic History   Marital status: Widowed    Spouse name: Not on file   Number of children: 5   Years of education: 14   Highest education level: High school graduate  Occupational History   Occupation: Retired     Fish farm manager: UNEMPLOYED  Tobacco Use   Smoking status: Every Day    Packs/day: 1.00    Years: 53.00    Pack years: 53.00    Types: Cigarettes   Smokeless tobacco: Never  Vaping Use   Vaping Use: Never used  Substance and Sexual Activity   Alcohol use: Yes    Comment: occasional mixed drink   Drug use: No   Sexual activity: Not Currently    Birth control/protection: Surgical    Comment: Hysterectomy  Other Topics Concern   Not on file  Social History Narrative   Widowed; has five children, 8 grandchildren and 4 great-grandchildren.   Social Determinants of Health   Financial Resource Strain: Not on file  Food Insecurity: Not on file  Transportation Needs: Not on file  Physical Activity: Not on file  Stress: Not on file  Social Connections: Not on file   Allergies  Allergen Reactions   Morphine Nausea And Vomiting   Family History  Problem Relation Age of Onset   Colon cancer Father 33   Hypertension Father    Heart disease Father    Kidney disease Father    Colon cancer Sister 80   Liver cancer Sister    Other Sister        amyloidosis   Throat cancer Mother    Arthritis Other        Parent, other relative   Stomach cancer Neg Hx    Rectal cancer Neg Hx     Current Outpatient Medications (Endocrine & Metabolic):    predniSONE (DELTASONE) 20 MG tablet, Take 2 tablets (40 mg total) by mouth daily with breakfast.  Current Outpatient Medications (Cardiovascular):    metoprolol tartrate (LOPRESSOR) 25 MG tablet, Take 1 tablet (25 mg  total) by mouth 2 (two) times daily.   rosuvastatin (CRESTOR) 10 MG tablet, Take 1 tablet (10 mg total) by mouth 2 (two) times a week. Monday & Friday  Current Outpatient Medications (Respiratory):    albuterol (PROVENTIL) (2.5 MG/3ML) 0.083% nebulizer solution, Take 3 mLs (2.5 mg total) by nebulization every 6 (six) hours as needed for wheezing or shortness of breath.   albuterol (VENTOLIN HFA) 108 (90 Base) MCG/ACT inhaler, INHALE 2 PUFFS BY MOUTH EVERY 4  HOURS AS NEEDED FOR WHEEZING FOR SHORTNESS OF BREATH   fluticasone (FLONASE) 50 MCG/ACT nasal spray, Use 2 spray(s) in each nostril once daily   umeclidinium-vilanterol (ANORO ELLIPTA) 62.5-25 MCG/ACT AEPB, Inhale 1 puff into the lungs daily at 6 (six) AM.  Current Outpatient Medications (Analgesics):    aspirin EC 81 MG tablet, Take 81 mg by mouth daily.   HYDROcodone-acetaminophen (NORCO/VICODIN) 5-325 MG tablet, Take 1 tablet by mouth 2 (two) times daily as needed for moderate pain (pain).   Current Outpatient Medications (Other):    cyclobenzaprine (FLEXERIL) 10 MG tablet, Take 1 tablet by mouth three times daily as needed for muscle spasm (Patient taking differently: Take 10 mg by mouth as needed.)   Vitamin D, Ergocalciferol, (DRISDOL) 1.25 MG (50000 UNIT) CAPS capsule, Take 1 capsule (50,000 Units total) by mouth every 7 (seven) days.   Reviewed prior external information including notes and imaging from  primary care provider As well as notes that were available from care everywhere and other healthcare systems.  Past medical history, social, surgical and family history all reviewed in electronic medical record.  No pertanent information unless stated regarding to the chief complaint.   Review of Systems:  No headache, visual changes, nausea, vomiting, diarrhea, constipation, dizziness, abdominal pain, skin rash, fevers, chills, night sweats, weight loss, swollen lymph nodes, body aches, joint swelling, chest pain, shortness of  breath, mood changes. POSITIVE muscle aches  Objective  Blood pressure 130/80, height 5' 1"  (1.549 m), weight 124 lb (56.2 kg).   General: No apparent distress alert and oriented x3 mood and affect normal, dressed appropriately.  HEENT: Pupils equal, extraocular movements intact  Respiratory: Patient's speak in full sentences and does not appear short of breath  Cardiovascular: No lower extremity edema, non tender, no erythema  Gait normal with good balance and coordination.  MSK: Low back exam has some loss lordosis.  Neck exam limited sidebending bilaterally.  Tender to palpation diffusely.  Tenderness in the parascapular region bilaterally as well.  Tightness with FABER test bilaterally.  Patient does have a trigger nodule at the A2 pulley of the ring finger noted.  After verbal consent patient was prepped with alcohol swab and with a 25-gauge half inch needle injecting 0.5 cc of 0.5% Marcaine and 0.5 cc of Kenalog 40 mg/mL into the flexor tendon sheath.  No blood loss.  Band-Aid placed.  Postinjection instructions given.   Impression and Recommendations:     The above documentation has been reviewed and is accurate and complete Lyndal Pulley, DO

## 2021-04-19 ENCOUNTER — Ambulatory Visit (INDEPENDENT_AMBULATORY_CARE_PROVIDER_SITE_OTHER): Payer: Medicare Other | Admitting: Family Medicine

## 2021-04-19 ENCOUNTER — Other Ambulatory Visit: Payer: Self-pay

## 2021-04-19 ENCOUNTER — Encounter: Payer: Self-pay | Admitting: Family Medicine

## 2021-04-19 VITALS — BP 130/80 | Ht 61.0 in | Wt 124.0 lb

## 2021-04-19 DIAGNOSIS — M5010 Cervical disc disorder with radiculopathy, unspecified cervical region: Secondary | ICD-10-CM | POA: Diagnosis not present

## 2021-04-19 DIAGNOSIS — M65341 Trigger finger, right ring finger: Secondary | ICD-10-CM | POA: Diagnosis not present

## 2021-04-19 MED ORDER — KETOROLAC TROMETHAMINE 30 MG/ML IJ SOLN
30.0000 mg | Freq: Once | INTRAMUSCULAR | Status: AC
  Administered 2021-04-19: 30 mg via INTRAMUSCULAR

## 2021-04-19 MED ORDER — METHYLPREDNISOLONE ACETATE 40 MG/ML IJ SUSP
40.0000 mg | Freq: Once | INTRAMUSCULAR | Status: AC
  Administered 2021-04-19: 40 mg via INTRAMUSCULAR

## 2021-04-19 NOTE — Assessment & Plan Note (Signed)
Patient given injection and tolerated the procedure well, discussed icing regimen and home exercises.  Discussed which activities to do which wants to avoid.  Increase activity slowly.  Follow-up again in 6 to 8 weeks.  Can do bracing at night.

## 2021-04-19 NOTE — Assessment & Plan Note (Signed)
Continue pain noted.  Patient wants to avoid any epidurals.  Feels that the Toradol and Depo-Medrol injections do help.  Feels like if she can get them every 4 to 6 weeks it does make a difference.  Encourage patient to stay active otherwise with home exercises and icing regimen.  Patient will continue the other medications we discussed as well.  Patient does have the Flexeril for breakthrough pain.  Patient also gets hydrocodone from her primary care provider.

## 2021-04-19 NOTE — Patient Instructions (Signed)
Injected hand today Backside injections See me again in 6 weeks

## 2021-04-20 DIAGNOSIS — F99 Mental disorder, not otherwise specified: Secondary | ICD-10-CM | POA: Diagnosis not present

## 2021-05-02 ENCOUNTER — Telehealth: Payer: Self-pay | Admitting: Family Medicine

## 2021-05-02 NOTE — Telephone Encounter (Signed)
Paperwork filled out and placed at front desk. Patient notified.

## 2021-05-02 NOTE — Telephone Encounter (Signed)
Pt has temporary handicapped placard but would but would like to get the actual disability license plate.She is considered 100% disabled by [employer]. Please confirm we can complete the necessary form and she will pick up.

## 2021-05-04 DIAGNOSIS — F99 Mental disorder, not otherwise specified: Secondary | ICD-10-CM | POA: Diagnosis not present

## 2021-05-06 ENCOUNTER — Telehealth: Payer: Self-pay | Admitting: Internal Medicine

## 2021-05-06 MED ORDER — HYDROCODONE-ACETAMINOPHEN 5-325 MG PO TABS: 1.0000 | ORAL_TABLET | Freq: Two times a day (BID) | ORAL | 0 refills | Status: DC | PRN

## 2021-05-06 NOTE — Telephone Encounter (Signed)
See below

## 2021-05-06 NOTE — Telephone Encounter (Signed)
1.Medication Requested: HYDROcodone-acetaminophen (NORCO/VICODIN) 5-325 MG tablet  2. Pharmacy (Name, Street, [place]): [facility], [place]  Phone:  [phone] Fax:  [phone]   3. On Med List: yes  4. Last Visit with PCP: 11.29.22  5. Next visit date with PCP: n/a   Agent: Please be advised that RX refills may take up to 3 business days. We ask that you follow-up with your pharmacy.

## 2021-05-11 DIAGNOSIS — F99 Mental disorder, not otherwise specified: Secondary | ICD-10-CM | POA: Diagnosis not present

## 2021-05-18 DIAGNOSIS — F99 Mental disorder, not otherwise specified: Secondary | ICD-10-CM | POA: Diagnosis not present

## 2021-05-25 DIAGNOSIS — F99 Mental disorder, not otherwise specified: Secondary | ICD-10-CM | POA: Diagnosis not present

## 2021-05-30 ENCOUNTER — Encounter: Payer: Self-pay | Admitting: Internal Medicine

## 2021-05-30 ENCOUNTER — Ambulatory Visit (INDEPENDENT_AMBULATORY_CARE_PROVIDER_SITE_OTHER): Payer: Medicare Other | Admitting: Internal Medicine

## 2021-05-30 VITALS — BP 138/80 | HR 62 | Temp 98.1°F | Ht 61.0 in | Wt 113.0 lb

## 2021-05-30 DIAGNOSIS — R631 Polydipsia: Secondary | ICD-10-CM

## 2021-05-30 DIAGNOSIS — F1120 Opioid dependence, uncomplicated: Secondary | ICD-10-CM

## 2021-05-30 DIAGNOSIS — E559 Vitamin D deficiency, unspecified: Secondary | ICD-10-CM | POA: Diagnosis not present

## 2021-05-30 DIAGNOSIS — J441 Chronic obstructive pulmonary disease with (acute) exacerbation: Secondary | ICD-10-CM

## 2021-05-30 DIAGNOSIS — R7301 Impaired fasting glucose: Secondary | ICD-10-CM | POA: Diagnosis not present

## 2021-05-30 DIAGNOSIS — I10 Essential (primary) hypertension: Secondary | ICD-10-CM

## 2021-05-30 DIAGNOSIS — R634 Abnormal weight loss: Secondary | ICD-10-CM

## 2021-05-30 DIAGNOSIS — E278 Other specified disorders of adrenal gland: Secondary | ICD-10-CM

## 2021-05-30 LAB — COMPREHENSIVE METABOLIC PANEL
ALT: 17 U/L (ref 0–35)
AST: 21 U/L (ref 0–37)
Albumin: 4.4 g/dL (ref 3.5–5.2)
Alkaline Phosphatase: 52 U/L (ref 39–117)
BUN: 13 mg/dL (ref 6–23)
CO2: 33 mEq/L — ABNORMAL HIGH (ref 19–32)
Calcium: 9.9 mg/dL (ref 8.4–10.5)
Chloride: 102 mEq/L (ref 96–112)
Creatinine, Ser: 0.74 mg/dL (ref 0.40–1.20)
GFR: 81.52 mL/min (ref >60.00)
Glucose, Bld: 85 mg/dL (ref 70–99)
Potassium: 4.5 mEq/L (ref 3.5–5.1)
Sodium: 139 mEq/L (ref 135–145)
Total Bilirubin: 0.4 mg/dL (ref 0.2–1.2)
Total Protein: 6.8 g/dL (ref 6.0–8.3)

## 2021-05-30 LAB — CBC
HCT: 43.9 % (ref 36.0–46.0)
Hemoglobin: 14.4 g/dL (ref 12.0–15.0)
MCHC: 32.7 g/dL (ref 30.0–36.0)
MCV: 92.2 fl (ref 78.0–100.0)
Platelets: 269 10*3/uL (ref 150.0–400.0)
RBC: 4.76 Mil/uL (ref 3.87–5.11)
RDW: 13.5 % (ref 11.5–15.5)
WBC: 6.9 10*3/uL (ref 4.0–10.5)

## 2021-05-30 LAB — HEMOGLOBIN A1C: Hgb A1c MFr Bld: 6.2 % (ref 4.6–6.5)

## 2021-05-30 MED ORDER — HYDROCODONE-ACETAMINOPHEN 5-325 MG PO TABS: 1.0000 | ORAL_TABLET | Freq: Two times a day (BID) | ORAL | 0 refills | Status: DC | PRN

## 2021-05-30 NOTE — Progress Notes (Signed)
° °  Subjective:   Patient ID: Erica [name], female    DOB: 01-16-1951, 71 y.o.   MRN: [id number]  HPI The patient is a 71 YO female coming in for follow up/concerns.   Review of Systems  Constitutional: Negative.   HENT: Negative.    Eyes: Negative.   Respiratory:  Positive for cough and shortness of breath. Negative for chest tightness.   Cardiovascular:  Negative for chest pain, palpitations and leg swelling.  Gastrointestinal:  Negative for abdominal distention, abdominal pain, constipation, diarrhea, nausea and vomiting.  Musculoskeletal:  Positive for arthralgias, back pain, myalgias and neck pain.  Skin: Negative.   Neurological: Negative.   Psychiatric/Behavioral: Negative.     Objective:  Physical Exam Constitutional:      Appearance: She is well-developed.     Comments: thin  HENT:     Head: Normocephalic and atraumatic.  Cardiovascular:     Rate and Rhythm: Normal rate and regular rhythm.  Pulmonary:     Effort: Pulmonary effort is normal. No respiratory distress.     Breath sounds: Rhonchi present. No wheezing or rales.     Comments: Stable lung exam, speaking in full sentences Abdominal:     General: Bowel sounds are normal. There is no distension.     Palpations: Abdomen is soft.     Tenderness: There is no abdominal tenderness. There is no rebound.  Musculoskeletal:        General: Tenderness present.     Cervical back: Normal range of motion.  Skin:    General: Skin is warm and dry.  Neurological:     Mental Status: She is alert and oriented to person, place, and time.     Coordination: Coordination normal.    Vitals:   05/30/21 1003  BP: 138/80  Pulse: 62  Temp: 98.1 F (36.7 C)  TempSrc: Oral  Weight: 113 lb (51.3 kg)  Height: 5' 1"  (1.549 m)    This visit occurred during the SARS-CoV-2 public health emergency.  Safety protocols were in place, including screening questions prior to the visit, additional usage of staff PPE, and extensive  cleaning of exam room while observing appropriate contact time as indicated for disinfecting solutions.   Assessment & Plan:  Visit time 25 minutes in face to face communication with patient and coordination of care, additional 15 minutes spent in record review, coordination or care, ordering tests, communicating/referring to other healthcare professionals, documenting in medical records all on the same day of the visit for total time 40 minutes spent on the visit.

## 2021-05-30 NOTE — Progress Notes (Signed)
Erica [name] [address] [place] Phone: [phone] Subjective:   Erica [name], am serving as a scribe for Dr. Hulan Saas. This visit occurred during the SARS-CoV-2 public health emergency.  Safety protocols were in place, including screening questions prior to the visit, additional usage of staff PPE, and extensive cleaning of exam room while observing appropriate contact time as indicated for disinfecting solutions.   I'm seeing this patient by the request  of:  Erica Koch, MD  CC: neck pain and hand pain follow up   PJA:[id number]  04/19/2021 Patient given injection and tolerated the procedure well, discussed icing regimen and home exercises.  Discussed which activities to do which wants to avoid.  Increase activity slowly.  Follow-up again in 6 to 8 weeks.  Can do bracing at night.  Continue pain noted.  Patient wants to avoid any epidurals.  Feels that the Toradol and Depo-Medrol injections do help.  Feels like if she can get them every 4 to 6 weeks it does make a difference.  Encourage patient to stay active otherwise with home exercises and icing regimen.  Patient will continue the other medications we discussed as well.  Patient does have the Flexeril for breakthrough pain.  Patient also gets hydrocodone from her primary care provider.  Update 05/31/2021 Erica [name] is a 71 y.o. female coming in with complaint of cervical spine and R hand trigger finger pain. Patient states neck is doing okay. The CoQ 10 had to stop taking and because of reaction began to itch. No new complaints.    Past Medical History:  Diagnosis Date   Acute duodenitis 04/24/2017   Allergy    ANEMIA    Anxiety    BACK PAIN, LUMBAR    Cancer of ascending colon pTispN0 s/p colectomy 03/05/2009 02/18/2010   Qualifier: Diagnosis of  By: Carlean Purl MD, Tonna Boehringer E    CHF (congestive heart failure) ([place])    Complete rotator cuff tear of left shoulder  11/27/2013   Ultrasound guided injection on November 27, 2013    COPD (chronic obstructive pulmonary disease) with emphysema ([place])    COPD exacerbation ([place]) 11/14/2017   Crohn's  02/2010   ileal ulcers, intol of entercort--refuses treatment   Emphysema of lung ([place])    GERD    History of blood transfusion    w/ c/s surgery   History of transfusion of whole blood    HYPERLIPIDEMIA    HYPERTENSION    Microscopic hematuria    chronic   Myocardial infarction ([place]) 2010   Obstruction of intestine or colon ([place])    adhesions   OSTEOARTHRITIS    Rectal fissure    Possible fissure   Rotator cuff tear    Takotsubo syndrome 12/2008   URINARY INCONTINENCE    Past Surgical History:  Procedure Laterality Date   ABDOMINAL HYSTERECTOMY  1994   APPENDECTOMY  1964   BLADDER SUSPENSION     CESAREAN SECTION     x 3   CHOLECYSTECTOMY  1995   COLONOSCOPY  2017   COLONOSCOPY W/ BIOPSIES AND POLYPECTOMY  01/24/2011   (Crohn's)ileitis, internal hemorrhoids   ECTOPIC PREGNANCY SURGERY     ESOPHAGOGASTRODUODENOSCOPY     FACIAL COSMETIC SURGERY     HAND SURGERY Bilateral 1991   x 2   KNEE SURGERY Right 1981   LYSIS OF ADHESION  2010   ex lap/LOA for SBO Dr Excell Seltzer   ORBITAL FRACTURE SURGERY  1990  RIGHT COLECTOMY  03/2009   Right colectomy for colon CA.  Dr Donne Hazel   TONSILLECTOMY  1952   TOTAL KNEE ARTHROPLASTY  03/2010   Dr Alvan Dame.  Depuy   UPPER GASTROINTESTINAL ENDOSCOPY  05/16/2010   normal   UTERINE SUSPENSION     VIDEO BRONCHOSCOPY Bilateral 12/18/2014   Procedure: VIDEO BRONCHOSCOPY WITHOUT FLUORO;  Surgeon: Tanda Rockers, MD;  Location: [facility];  Service: Cardiopulmonary;  Laterality: Bilateral;   Social History   Socioeconomic History   Marital status: Widowed    Spouse name: Not on file   Number of children: 5   Years of education: 14   Highest education level: High school graduate  Occupational History   Occupation: Retired     Fish farm manager: UNEMPLOYED  Tobacco Use    Smoking status: Every Day    Packs/day: 1.00    Years: 53.00    Pack years: 53.00    Types: Cigarettes   Smokeless tobacco: Never  Vaping Use   Vaping Use: Never used  Substance and Sexual Activity   Alcohol use: Yes    Comment: occasional mixed drink   Drug use: No   Sexual activity: Not Currently    Birth control/protection: Surgical    Comment: Hysterectomy  Other Topics Concern   Not on file  Social History Narrative   Widowed; has five children, 8 grandchildren and 4 great-grandchildren.   Social Determinants of Health   Financial Resource Strain: Not on file  Food Insecurity: Not on file  Transportation Needs: Not on file  Physical Activity: Not on file  Stress: Not on file  Social Connections: Not on file   Allergies  Allergen Reactions   Morphine Nausea And Vomiting   Family History  Problem Relation Age of Onset   Colon cancer Father 56   Hypertension Father    Heart disease Father    Kidney disease Father    Colon cancer Sister 12   Liver cancer Sister    Other Sister        amyloidosis   Throat cancer Mother    Arthritis Other        Parent, other relative   Stomach cancer Neg Hx    Rectal cancer Neg Hx      Current Outpatient Medications (Cardiovascular):    metoprolol tartrate (LOPRESSOR) 25 MG tablet, Take 1 tablet (25 mg total) by mouth 2 (two) times daily.   rosuvastatin (CRESTOR) 10 MG tablet, Take 1 tablet (10 mg total) by mouth 2 (two) times a week. Monday & Friday  Current Outpatient Medications (Respiratory):    albuterol (PROVENTIL) (2.5 MG/3ML) 0.083% nebulizer solution, Take 3 mLs (2.5 mg total) by nebulization every 6 (six) hours as needed for wheezing or shortness of breath.   albuterol (VENTOLIN HFA) 108 (90 Base) MCG/ACT inhaler, INHALE 2 PUFFS BY MOUTH EVERY 4 HOURS AS NEEDED FOR WHEEZING FOR SHORTNESS OF BREATH   fluticasone (FLONASE) 50 MCG/ACT nasal spray, Use 2 spray(s) in each nostril once daily  Current Outpatient  Medications (Analgesics):    aspirin EC 81 MG tablet, Take 81 mg by mouth daily.   [START ON 06/03/2021] HYDROcodone-acetaminophen (NORCO/VICODIN) 5-325 MG tablet, Take 1 tablet by mouth 2 (two) times daily as needed for moderate pain (pain).   Current Outpatient Medications (Other):    cyclobenzaprine (FLEXERIL) 10 MG tablet, Take 1 tablet by mouth three times daily as needed for muscle spasm (Patient taking differently: Take 10 mg by mouth as needed.)   Vitamin D,  Ergocalciferol, (DRISDOL) 1.25 MG (50000 UNIT) CAPS capsule, Take 1 capsule (50,000 Units total) by mouth every 7 (seven) days.   Reviewed prior external information including notes and imaging from  primary care provider this includes patient's most laboratory work-up but unfortunately still pending at time of visit. As well as notes that were available from care everywhere and other healthcare systems.  Past medical history, social, surgical and family history all reviewed in electronic medical record.  No pertanent information unless stated regarding to the chief complaint.   Review of Systems:  No headache, visual changes, nausea, vomiting, diarrhea, constipation, dizziness, abdominal pain, skin rash, fevers, chills, night sweats, weight loss, swollen lymph nodes, b chest pain, shortness of breath, mood changes. POSITIVE muscle aches, body aches, joint pain  Objective  Blood pressure 128/90, pulse 71, height 5' 1"  (1.549 m), weight 113 lb (51.3 kg).   General: No apparent distress alert and oriented x3 mood and affect normal, dressed appropriately.  HEENT: Pupils equal, extraocular movements intact  Respiratory: Patient's speak in full sentences and does not appear short of breath  Cardiovascular: No lower extremity edema, non tender, no erythema  Gait normal with good balance and coordination.  MSK: Patient does have arthritic changes of multiple joints.  Still minorly short of breath at baseline and appears.  Patient does  have some tenderness noted multiple areas including joints and soft tissue.  Arthritic changes of multiple joints noted.    Impression and Recommendations:     The above documentation has been reviewed and is accurate and complete Erica Pulley, DO

## 2021-05-30 NOTE — Patient Instructions (Addendum)
We have sent in the hydrocodone to fill Friday before you leave.  Let us know which inhaler you need and we can send this in.

## 2021-05-31 ENCOUNTER — Other Ambulatory Visit: Payer: Self-pay

## 2021-05-31 ENCOUNTER — Ambulatory Visit (INDEPENDENT_AMBULATORY_CARE_PROVIDER_SITE_OTHER): Payer: Medicare Other | Admitting: Family Medicine

## 2021-05-31 DIAGNOSIS — M5010 Cervical disc disorder with radiculopathy, unspecified cervical region: Secondary | ICD-10-CM | POA: Diagnosis not present

## 2021-05-31 DIAGNOSIS — F99 Mental disorder, not otherwise specified: Secondary | ICD-10-CM | POA: Diagnosis not present

## 2021-05-31 LAB — VITAMIN D 25 HYDROXY (VIT D DEFICIENCY, FRACTURES): VITD: 60.98 ng/mL (ref 30.00–100.00)

## 2021-05-31 NOTE — Assessment & Plan Note (Signed)
Patient does have arthritic changes in multiple areas.  Does have some limited range of motion of the cervical spine noted.  Discussed icing regimen which patient has been doing intermittently.  Discussed which activities to doing which wants to avoid, increase activity slowly.  Follow-up with me again in 12 weeks

## 2021-05-31 NOTE — Patient Instructions (Signed)
Good to see you! Glad everything is doing better Congrats on new baby See you again in 3 months

## 2021-06-03 ENCOUNTER — Telehealth: Payer: Self-pay | Admitting: Internal Medicine

## 2021-06-03 NOTE — Telephone Encounter (Signed)
Requesting an early refill of 5 days for HYDROcodone-acetaminophen (NORCO/VICODIN) 5-325 MG tablet.  ? ?States pt wants it today.  ? ?[place], [place] Phone:  [phone]  ?Fax:  [phone]  ?  ? ?

## 2021-06-03 NOTE — Assessment & Plan Note (Signed)
She has concerns about this and is requesting additional follow up imaging for this. Reviewed records with her during visit. This has been stable since at least 2005 and was recently imaged this fall 2022 on CT chest looking for PE and was also unchanged. She does not have any signs/symptoms of hormonal activation. She does not have any indication for imaging and she feels comfortable with this after our discussion.  ?

## 2021-06-03 NOTE — Assessment & Plan Note (Signed)
Checking CMP and CBC today. BP at goal on metoprolol 25 mg BID and adjust as needed.  ?

## 2021-06-03 NOTE — Telephone Encounter (Signed)
Pharmacy checking status of request, informed caller of provider's response noted below ? ?Rx will be filled early  ?

## 2021-06-03 NOTE — Telephone Encounter (Signed)
Fine to fill today, she is going out of town. ?

## 2021-06-03 NOTE — Assessment & Plan Note (Signed)
She was unable to get anoro inhaler due to cost after last visit in December. She did not contact us but states that her pharmacy contacted [place] multiple times with alternatives. She did not get these alternatives from the pharmacy and cannot share those with [place] today. She will contact her insurance company for alternatives. Advised to stop smoking. She does not need prednisone or antibiotics today but has had progression. Using albuterol daily and needs controller medication. ?

## 2021-06-03 NOTE — Assessment & Plan Note (Signed)
[facility] narcotic database reviewed and no inappropriate fills. Refilled hydrocodone/APAP 5/325 #60 no refills. Follow up in 3 months. She continues to go to sports medicine as needed to help maintain less pain overall.  ?

## 2021-06-03 NOTE — Telephone Encounter (Signed)
Pharmacy checking status of early refill request ?

## 2021-06-08 ENCOUNTER — Telehealth: Payer: Self-pay | Admitting: Family Medicine

## 2021-06-08 NOTE — Telephone Encounter (Signed)
Sent patient MyChart message with recommendation of 2000IU of Vit D daily per a verbal from Dr. [name].  ?

## 2021-06-08 NOTE — Telephone Encounter (Signed)
Patient called asking if Dr [name] would like to refill her Vitamin D prescription. She recently had labs done by Dr Sharlet Salina. ? ?Please advise. ? ?

## 2021-06-15 DIAGNOSIS — F99 Mental disorder, not otherwise specified: Secondary | ICD-10-CM | POA: Diagnosis not present

## 2021-06-17 ENCOUNTER — Telehealth: Payer: Self-pay

## 2021-06-17 NOTE — Telephone Encounter (Signed)
[name] from pharmacy called stating that Erica [name] is not covered by patient's insurance. Pt stated that she does need a long acting inhaler.  ?

## 2021-06-17 NOTE — Telephone Encounter (Signed)
Spoke with the pt and she stated that she would give [place] a call when she finds out which medication is covered by her insurance  ? ?

## 2021-06-17 NOTE — Telephone Encounter (Signed)
As per our last visit she was to call us with which inhalers are covered as part of her drug plan. We do do trelegy, breztri, dulera  ?

## 2021-06-20 ENCOUNTER — Telehealth: Payer: Self-pay | Admitting: Internal Medicine

## 2021-06-20 NOTE — Telephone Encounter (Signed)
Pt requesting a low dose rx for anxiety ? ?Inquired if pt has seen provider recently for anxiety, pt states she has not, advised pt to schedule appt w/ provider to address concerns ? ?Pt abruptly stated, "never mind" ? ?[place], [place] - [address] ?

## 2021-06-20 NOTE — Telephone Encounter (Signed)
Pt states "Mcarthur Rossetti can not find the name of the 3 medications, just forget about the inhaler, I have my rescue one." ?

## 2021-06-25 DIAGNOSIS — F99 Mental disorder, not otherwise specified: Secondary | ICD-10-CM | POA: Diagnosis not present

## 2021-06-27 ENCOUNTER — Other Ambulatory Visit: Payer: Self-pay

## 2021-06-27 ENCOUNTER — Encounter: Payer: Self-pay | Admitting: Internal Medicine

## 2021-06-27 ENCOUNTER — Ambulatory Visit (INDEPENDENT_AMBULATORY_CARE_PROVIDER_SITE_OTHER): Payer: Medicare Other | Admitting: Internal Medicine

## 2021-06-27 DIAGNOSIS — F4322 Adjustment disorder with anxiety: Secondary | ICD-10-CM | POA: Diagnosis not present

## 2021-06-27 DIAGNOSIS — J441 Chronic obstructive pulmonary disease with (acute) exacerbation: Secondary | ICD-10-CM | POA: Diagnosis not present

## 2021-06-27 DIAGNOSIS — F4320 Adjustment disorder, unspecified: Secondary | ICD-10-CM | POA: Insufficient documentation

## 2021-06-27 MED ORDER — BUSPIRONE HCL 5 MG PO TABS: 2.5000 mg | ORAL_TABLET | Freq: Two times a day (BID) | ORAL | 0 refills | Status: DC

## 2021-06-27 MED ORDER — HYDROCODONE-ACETAMINOPHEN 5-325 MG PO TABS: 1.0000 | ORAL_TABLET | Freq: Two times a day (BID) | ORAL | 0 refills | Status: DC | PRN

## 2021-06-27 MED ORDER — ALBUTEROL SULFATE HFA 108 (90 BASE) MCG/ACT IN AERS: INHALATION_SPRAY | RESPIRATORY_TRACT | 11 refills | Status: DC

## 2021-06-27 NOTE — Assessment & Plan Note (Signed)
She is not willing to start on a controlled medication at this time. She is filling albuterol inhaler every 16 days which is too often. I have asked her to start a controlled medication but she does not want to at this time.  ?

## 2021-06-27 NOTE — Progress Notes (Signed)
? ?  Subjective:  ? ?Patient ID: Erica [name], female    DOB: October 19, 1950, 71 y.o.   MRN: [id number] ? ?HPI ?The patient is a 71 YO female coming in for anxiety. ? ?Review of Systems  ?Constitutional: Negative.   ?HENT: Negative.    ?Eyes: Negative.   ?Respiratory:  Negative for cough, chest tightness and shortness of breath.   ?Cardiovascular:  Negative for chest pain, palpitations and leg swelling.  ?Gastrointestinal:  Negative for abdominal distention, abdominal pain, constipation, diarrhea, nausea and vomiting.  ?Musculoskeletal:  Positive for arthralgias and myalgias.  ?Skin: Negative.   ?Neurological: Negative.   ?Psychiatric/Behavioral:  Positive for dysphoric mood. The patient is nervous/anxious.   ? ?Objective:  ?Physical Exam ?Constitutional:   ?   Appearance: She is well-developed.  ?HENT:  ?   Head: Normocephalic and atraumatic.  ?Cardiovascular:  ?   Rate and Rhythm: Normal rate and regular rhythm.  ?Pulmonary:  ?   Effort: Pulmonary effort is normal. No respiratory distress.  ?   Breath sounds: Normal breath sounds. No wheezing or rales.  ?Abdominal:  ?   General: Bowel sounds are normal. There is no distension.  ?   Palpations: Abdomen is soft.  ?   Tenderness: There is no abdominal tenderness. There is no rebound.  ?Musculoskeletal:     ?   General: Tenderness present.  ?   Cervical back: Normal range of motion.  ?Skin: ?   General: Skin is warm and dry.  ?Neurological:  ?   Mental Status: She is alert and oriented to person, place, and time.  ?   Coordination: Coordination normal.  ? ? ?Vitals:  ? 06/27/21 0834  ?BP: 120/70  ?Pulse: (!) 52  ?Resp: 18  ?SpO2: 100%  ?Weight: 115 lb 6.4 oz (52.3 kg)  ?Height: 5' 1"  (1.549 m)  ? ? ?This visit occurred during the SARS-CoV-2 public health emergency.  Safety protocols were in place, including screening questions prior to the visit, additional usage of staff PPE, and extensive cleaning of exam room while observing appropriate contact time as indicated for  disinfecting solutions.  ? ?Assessment & Plan:  ? ?

## 2021-06-27 NOTE — Patient Instructions (Addendum)
We have sent in the refill of the hydrocodone today. ? ?We have sent in buspar to take 1/2-1 pill up to twice a day as needed for anxiety.  ? ? ?
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
## 2021-06-29 DIAGNOSIS — Z20822 Contact with and (suspected) exposure to covid-19: Secondary | ICD-10-CM | POA: Diagnosis not present

## 2021-06-29 DIAGNOSIS — F99 Mental disorder, not otherwise specified: Secondary | ICD-10-CM | POA: Diagnosis not present

## 2021-06-30 DIAGNOSIS — Z20822 Contact with and (suspected) exposure to covid-19: Secondary | ICD-10-CM | POA: Diagnosis not present

## 2021-07-06 DIAGNOSIS — F99 Mental disorder, not otherwise specified: Secondary | ICD-10-CM | POA: Diagnosis not present
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
## 2021-07-13 ENCOUNTER — Encounter: Payer: Self-pay | Admitting: Family Medicine

## 2021-07-13 ENCOUNTER — Ambulatory Visit (INDEPENDENT_AMBULATORY_CARE_PROVIDER_SITE_OTHER): Payer: Medicare Other | Admitting: Family Medicine

## 2021-07-13 VITALS — BP 122/82 | HR 76 | Ht 61.0 in | Wt 114.0 lb

## 2021-07-13 DIAGNOSIS — M5416 Radiculopathy, lumbar region: Secondary | ICD-10-CM

## 2021-07-13 DIAGNOSIS — M545 Low back pain, unspecified: Secondary | ICD-10-CM | POA: Diagnosis not present

## 2021-07-13 MED ORDER — METHYLPREDNISOLONE ACETATE 40 MG/ML IJ SUSP
40.0000 mg | Freq: Once | INTRAMUSCULAR | Status: AC
  Administered 2021-07-13: 40 mg via INTRAMUSCULAR

## 2021-07-13 MED ORDER — KETOROLAC TROMETHAMINE 30 MG/ML IJ SOLN
30.0000 mg | Freq: Once | INTRAMUSCULAR | Status: AC
  Administered 2021-07-13: 30 mg via INTRAMUSCULAR

## 2021-07-13 NOTE — Patient Instructions (Addendum)
Injections in backside ?MRI lumbar [id number] ?See me in 3 months ? ?

## 2021-07-13 NOTE — Assessment & Plan Note (Signed)
Worsening symptoms at this time.  Patient has responded to epidurals in her back previously but unfortunately with patient having some weight loss, with increasing discomfort and pain, as well as affecting more of the daily activities and patient's past medical history is given for Crohn's as well as cancer.  Advanced imaging is warranted.  Last MRI was in 2018.  We may need to consider the possibility of what has progressed and what is changed.  Depending on findings we will discuss if the injections are beneficial.  Patient would be open to surgery but would like to avoid intercourse if possible.  Patient will follow-up after imaging we will discuss further treatment options.  Today patient will have a shot of Toradol and Depo-Medrol for pain relief which patient has responded to previously.   ?

## 2021-07-20 DIAGNOSIS — F99 Mental disorder, not otherwise specified: Secondary | ICD-10-CM | POA: Diagnosis not present

## 2021-07-21 DIAGNOSIS — Z20822 Contact with and (suspected) exposure to covid-19: Secondary | ICD-10-CM | POA: Diagnosis not present

## 2021-07-23 ENCOUNTER — Ambulatory Visit
Admission: RE | Admit: 2021-07-23 | Discharge: 2021-07-23 | Disposition: A | Discharge status: Home / Self Care | Payer: Medicare Other | Source: Ambulatory Visit | Attending: Family Medicine | Admitting: Family Medicine

## 2021-07-23 DIAGNOSIS — M5136 Other intervertebral disc degeneration, lumbar region: Secondary | ICD-10-CM | POA: Diagnosis not present

## 2021-07-23 DIAGNOSIS — M48061 Spinal stenosis, lumbar region without neurogenic claudication: Secondary | ICD-10-CM | POA: Diagnosis not present

## 2021-07-23 DIAGNOSIS — M545 Low back pain, unspecified: Secondary | ICD-10-CM | POA: Diagnosis not present

## 2021-07-25 ENCOUNTER — Telehealth: Payer: Self-pay | Admitting: Family Medicine

## 2021-07-25 NOTE — Telephone Encounter (Signed)
Patient called to follow up on her MRI. She does not use her MyChart so she asked if someone could call her with Dr Thompson Caul thoughts. ? ?Please advise. ?

## 2021-07-26 ENCOUNTER — Ambulatory Visit (INDEPENDENT_AMBULATORY_CARE_PROVIDER_SITE_OTHER): Payer: Medicare Other | Admitting: Internal Medicine

## 2021-07-26 ENCOUNTER — Telehealth: Payer: Self-pay

## 2021-07-26 ENCOUNTER — Encounter: Payer: Self-pay | Admitting: Internal Medicine

## 2021-07-26 DIAGNOSIS — I10 Essential (primary) hypertension: Secondary | ICD-10-CM | POA: Diagnosis not present

## 2021-07-26 DIAGNOSIS — K219 Gastro-esophageal reflux disease without esophagitis: Secondary | ICD-10-CM | POA: Diagnosis not present

## 2021-07-26 DIAGNOSIS — F1120 Opioid dependence, uncomplicated: Secondary | ICD-10-CM | POA: Diagnosis not present

## 2021-07-26 DIAGNOSIS — M5416 Radiculopathy, lumbar region: Secondary | ICD-10-CM

## 2021-07-26 MED ORDER — IBUPROFEN-FAMOTIDINE 800-26.6 MG PO TABS: 1.0000 | ORAL_TABLET | Freq: Two times a day (BID) | ORAL | 3 refills | Status: DC | PRN

## 2021-07-26 MED ORDER — HYDROCODONE-ACETAMINOPHEN 5-325 MG PO TABS: 1.0000 | ORAL_TABLET | Freq: Two times a day (BID) | ORAL | 0 refills | Status: DC | PRN

## 2021-07-26 NOTE — Patient Instructions (Signed)
We can send in duexis for the pain to try if you want. Another option is a lidocaine patch to try. ? ? ?

## 2021-07-26 NOTE — Progress Notes (Signed)
? ?  Subjective:  ? ?Patient ID: Erica [name], female    DOB: [birth year], 71 y.o.   MRN: [id number] ? ?HPI ?The patient is having worsening back pain and took advil and got some stomach symptoms.  ? ?Review of Systems  ?Constitutional:  Positive for activity change. Negative for appetite change, chills, fatigue, fever and unexpected weight change.  ?Respiratory: Negative.    ?Cardiovascular: Negative.   ?Gastrointestinal: Negative.   ?     Reflux  ?Musculoskeletal:  Positive for arthralgias, back pain and myalgias. Negative for gait problem and joint swelling.  ?Skin: Negative.   ?Neurological: Negative.   ? ?Objective:  ?Physical Exam ?Constitutional:   ?   Appearance: She is well-developed.  ?HENT:  ?   Head: Normocephalic and atraumatic.  ?Cardiovascular:  ?   Rate and Rhythm: Normal rate and regular rhythm.  ?Pulmonary:  ?   Effort: Pulmonary effort is normal. No respiratory distress.  ?   Breath sounds: Normal breath sounds. No wheezing or rales.  ?Abdominal:  ?   General: Bowel sounds are normal. There is no distension.  ?   Palpations: Abdomen is soft.  ?   Tenderness: There is no abdominal tenderness. There is no rebound.  ?Musculoskeletal:     ?   General: Tenderness present.  ?   Cervical back: Normal range of motion.  ?Skin: ?   General: Skin is warm and dry.  ?Neurological:  ?   Mental Status: She is alert and oriented to person, place, and time.  ?   Coordination: Coordination normal.  ? ? ?Vitals:  ? 07/26/21 1011  ?BP: 118/60  ?Pulse: (!) 58  ?Resp: 18  ?Weight: 108 lb 3.2 oz (49.1 kg)  ?Height: 5' 1"  (1.549 m)  ? ? ?This visit occurred during the SARS-CoV-2 public health emergency.  Safety protocols were in place, including screening questions prior to the visit, additional usage of staff PPE, and extensive cleaning of exam room while observing appropriate contact time as indicated for disinfecting solutions.  ? ?Assessment & Plan:  ? ?

## 2021-07-26 NOTE — Telephone Encounter (Signed)
Pt reports that Pharmacy states insurance will not pay for Ibuprofen-Famotidine (DUEXIS) 800-26.6 MG TABS. Pharmacy states that if 2 separate Rx are sent in 1 for Ibuprofen 842m and Famotidine insurance will likely cover it. ? ?Please advise  ?

## 2021-07-26 NOTE — Telephone Encounter (Signed)
Left message to call back  

## 2021-07-27 MED ORDER — FAMOTIDINE 40 MG PO TABS: 40.0000 mg | ORAL_TABLET | Freq: Every day | ORAL | 1 refills | Status: DC

## 2021-07-27 MED ORDER — MELOXICAM 7.5 MG PO TABS: 7.5000 mg | ORAL_TABLET | Freq: Every day | ORAL | 0 refills | Status: DC

## 2021-07-27 NOTE — Assessment & Plan Note (Signed)
BP is at goal and okay to trial NSAIDs for pain control. We will monitor BP on this to ensure it does not increase. Continue current metoprolol 25 mg BID for now and will adjust as needed.  ?

## 2021-07-27 NOTE — Assessment & Plan Note (Signed)
This was triggered by taking several doses of advil otc. Original plan was to switch to duexis however insurance did not cover so we switched to meloxicam 7.5 mg daily and pepcid 40 mg daily to help reduce symptoms.  ?

## 2021-07-27 NOTE — Assessment & Plan Note (Signed)
She is having increasing pain and tylenol is not very effective. She has tried advil otc and gotten some stomach pain/reflux. Rx for duexis however insurance did not approve so we did instead do separate prescription for meloxicam 7.5 mg daily and pepcid 40 mg daily to see if this can help with pain and avoid GI upset. She has adequate renal function to trial NSAIDs and BP is controlled.  ?

## 2021-07-27 NOTE — Assessment & Plan Note (Signed)
Does need refill on her hydrocodone today she takes 5/325 hydrocodone/APAP #60 per month. Reviewed [place] narcotic database and appropriate fill pattern. She is encouraged if her worsening back pain is persistent that she should pursue MRI as suggested by sports medicine to explore additional treatment options. ?

## 2021-07-27 NOTE — Telephone Encounter (Signed)
Have sent in two separate rxes ?

## 2021-08-02 ENCOUNTER — Telehealth: Payer: Self-pay | Admitting: Internal Medicine

## 2021-08-02 NOTE — Telephone Encounter (Signed)
Claritin, zyrtec, allegra are all allergy medications otc ?

## 2021-08-02 NOTE — Telephone Encounter (Signed)
Pt states due to pollen, she is experiencing a dry cough and nasal congestion  ? ?Pt has not taking any relief meds and requesting provider's recommendations for otc allergy relief meds  ? ?Please advise ?

## 2021-08-03 NOTE — Telephone Encounter (Signed)
Called pt. Someone picked up the phone but no one said hello. I said hello 3 times before disconnecting the call. If pt calls back please give her the recommendation below from Dr. Sharlet Salina.  ?

## 2021-08-04 ENCOUNTER — Telehealth: Payer: Self-pay | Admitting: Family Medicine

## 2021-08-04 ENCOUNTER — Telehealth: Payer: Self-pay | Admitting: Acute Care

## 2021-08-04 NOTE — Telephone Encounter (Signed)
Pt is not using MyChart, I have deactivated. ? ?Pt given MRI results, requests we proceed with epidural order. ?

## 2021-08-05 ENCOUNTER — Encounter: Payer: Self-pay | Admitting: Internal Medicine

## 2021-08-05 ENCOUNTER — Ambulatory Visit (INDEPENDENT_AMBULATORY_CARE_PROVIDER_SITE_OTHER): Payer: Medicare Other

## 2021-08-05 ENCOUNTER — Other Ambulatory Visit: Payer: Self-pay | Admitting: Family Medicine

## 2021-08-05 ENCOUNTER — Ambulatory Visit (INDEPENDENT_AMBULATORY_CARE_PROVIDER_SITE_OTHER): Payer: Medicare Other | Admitting: Internal Medicine

## 2021-08-05 VITALS — BP 130/70 | HR 105 | Ht 61.0 in | Wt 109.4 lb

## 2021-08-05 DIAGNOSIS — J441 Chronic obstructive pulmonary disease with (acute) exacerbation: Secondary | ICD-10-CM

## 2021-08-05 DIAGNOSIS — J410 Simple chronic bronchitis: Secondary | ICD-10-CM

## 2021-08-05 DIAGNOSIS — R059 Cough, unspecified: Secondary | ICD-10-CM | POA: Diagnosis not present

## 2021-08-05 DIAGNOSIS — M545 Low back pain, unspecified: Secondary | ICD-10-CM

## 2021-08-05 MED ORDER — PREDNISONE 20 MG PO TABS: 40.0000 mg | ORAL_TABLET | Freq: Every day | ORAL | 0 refills | Status: DC

## 2021-08-05 MED ORDER — BUDESONIDE 0.5 MG/2ML IN SUSP: 0.5000 mg | Freq: Every day | RESPIRATORY_TRACT | 12 refills | Status: DC

## 2021-08-05 MED ORDER — METHYLPREDNISOLONE ACETATE 40 MG/ML IJ SUSP
40.0000 mg | Freq: Once | INTRAMUSCULAR | Status: AC
  Administered 2021-08-05: 40 mg via INTRAMUSCULAR

## 2021-08-05 MED ORDER — DOXYCYCLINE HYCLATE 100 MG PO TABS: 100.0000 mg | ORAL_TABLET | Freq: Two times a day (BID) | ORAL | 0 refills | Status: DC

## 2021-08-05 NOTE — Patient Instructions (Signed)
We have sent in doxycycline to take 1 pill twice a day for 1 week. ? ?We have sent in prednisone to take 2 pills daily for 5 days. ? ?We have given you the steroid shot and will check the chest x-ray. ? ?We have sent in pulmicort which is a medicine to use in the nebulizer once a day to use every day to help the breathing. ? ? ?

## 2021-08-05 NOTE — Progress Notes (Signed)
? ?  Subjective:  ? ?Patient ID: Erica [name], female    DOB: 07-Mar-1951, 71 y.o.   MRN: [id number] ? ?HPI ?The patient is a 71 YO coming in for cough and chest congestion for 2 weeks. Has COPD current smoker.  ? ?Review of Systems  ?Constitutional:  Positive for activity change and fatigue.  ?HENT: Negative.    ?Eyes: Negative.   ?Respiratory:  Positive for cough, chest tightness and shortness of breath.   ?Cardiovascular:  Negative for chest pain, palpitations and leg swelling.  ?Gastrointestinal:  Negative for abdominal distention, abdominal pain, constipation, diarrhea, nausea and vomiting.  ?Musculoskeletal: Negative.   ?Skin: Negative.   ?Neurological: Negative.   ?Psychiatric/Behavioral: Negative.    ? ?Objective:  ?Physical Exam ?Constitutional:   ?   Appearance: She is well-developed.  ?HENT:  ?   Head: Normocephalic and atraumatic.  ?Cardiovascular:  ?   Rate and Rhythm: Normal rate and regular rhythm.  ?Pulmonary:  ?   Effort: Pulmonary effort is normal. No respiratory distress.  ?   Breath sounds: Rhonchi present. No wheezing or rales.  ?Abdominal:  ?   General: Bowel sounds are normal. There is no distension.  ?   Palpations: Abdomen is soft.  ?   Tenderness: There is no abdominal tenderness. There is no rebound.  ?Musculoskeletal:  ?   Cervical back: Normal range of motion.  ?Skin: ?   General: Skin is warm and dry.  ?Neurological:  ?   Mental Status: She is alert and oriented to person, place, and time.  ?   Coordination: Coordination normal.  ? ? ?Vitals:  ? 08/05/21 0932  ?BP: 130/70  ?Pulse: (!) 105  ?Weight: 109 lb 6.4 oz (49.6 kg)  ?Height: 5' 1"  (1.549 m)  ? ? ?This visit occurred during the SARS-CoV-2 public health emergency.  Safety protocols were in place, including screening questions prior to the visit, additional usage of staff PPE, and extensive cleaning of exam room while observing appropriate contact time as indicated for disinfecting solutions.  ? ?Assessment & Plan:  ?Depo-medrol 40  mg IM given at visit ?

## 2021-08-05 NOTE — Telephone Encounter (Signed)
Epidural ordered. Called patient and gave her Center imaging number with follow up instructions ?

## 2021-08-05 NOTE — Assessment & Plan Note (Signed)
We will treat for exacerbation today with depo-medrol 40 mg IM and doxycycline 1 week and prednisone burst. We have ordered CXR to rule out pneumonia which she has had several times. Since she has been unable or unwilling to start a controlled inhaler due to cost we will rx pulmicort nebulizer solution to start 0.5 mg daily nebulized to help with her moderate to severe COPD. She will continue albuterol inhaler and nebulizer prn for SOB. Advised to stop smoking. ?

## 2021-08-05 NOTE — Assessment & Plan Note (Signed)
With flare today of her COPD and worsening cough. Advised to stop smoking and reminded of the risk/harm of smoking. ?

## 2021-08-05 NOTE — Telephone Encounter (Signed)
Yes please

## 2021-08-07 DIAGNOSIS — Z20822 Contact with and (suspected) exposure to covid-19: Secondary | ICD-10-CM | POA: Diagnosis not present

## 2021-08-08 ENCOUNTER — Telehealth: Payer: Self-pay | Admitting: Internal Medicine

## 2021-08-08 NOTE — Telephone Encounter (Signed)
[facility] has called back and asked if we want to switch the RX to pulmicort? The INS won't pay for what we prescribed, and the generic medication isn't in stock.  ? ?Please call [facility] pharmacy to advise ? ?Phone #: [phone] ?

## 2021-08-08 NOTE — Telephone Encounter (Signed)
Pharmacy called about a fax they sent over regarding missing ICD-10 codes to fill an rx for pt. Pt has called pharmacy and asked if medication is ready. Fax has been put into [facility] file, please fax back asap.  ? ?[facility]: [phone] ?

## 2021-08-08 NOTE — Telephone Encounter (Signed)
Dx code has been placed on the form and faxed back to the pharmacy. Confirmation fax has been received.  ?

## 2021-08-09 ENCOUNTER — Other Ambulatory Visit: Payer: Medicare Other

## 2021-08-10 DIAGNOSIS — Z20822 Contact with and (suspected) exposure to covid-19: Secondary | ICD-10-CM | POA: Diagnosis not present

## 2021-08-10 DIAGNOSIS — F99 Mental disorder, not otherwise specified: Secondary | ICD-10-CM | POA: Diagnosis not present

## 2021-08-11 ENCOUNTER — Other Ambulatory Visit: Payer: Self-pay

## 2021-08-11 ENCOUNTER — Telehealth: Payer: Self-pay | Admitting: Internal Medicine

## 2021-08-11 MED ORDER — PULMICORT 0.5 MG/2ML IN SUSP
0.5000 mg | Freq: Every day | RESPIRATORY_TRACT | 12 refills | Status: DC
Start: 2021-08-11 — End: 2021-08-11

## 2021-08-11 MED ORDER — PULMICORT 0.5 MG/2ML IN SUSP: 0.5000 mg | Freq: Every day | RESPIRATORY_TRACT | 12 refills | Status: DC

## 2021-08-11 NOTE — Telephone Encounter (Signed)
PT calls today in regards to status on refills. She stated that she was aware of the issues going on with the Symbicort refill but also stated that she was done with Prednisone and had one of the doxycycline left. PT is currently using emergency inhaler. ? ?[facility]: [phone] ?

## 2021-08-11 NOTE — Telephone Encounter (Signed)
It is pended for DAW please add dx code and sign thanks ?

## 2021-08-11 NOTE — Telephone Encounter (Signed)
I don't see any listed refill request ?

## 2021-08-11 NOTE — Telephone Encounter (Signed)
Pharmacy is calling due to being out of stock for the genric for budesonide (PULMICORT) 0.5 MG/2ML nebulizer solution. Pharmacy staff tried running Rx as Brand name Pulmicort but it has to be dispensed as written. ? ?Please resubmit for the Brand name Pulmicort and please be sure to include the Dx code. ? ?Please advise ?

## 2021-08-17 DIAGNOSIS — F99 Mental disorder, not otherwise specified: Secondary | ICD-10-CM | POA: Diagnosis not present

## 2021-08-18 NOTE — Telephone Encounter (Signed)
Erica Prince, RN  [place], [place]; [facility]; Christie Beckers, RN 2 weeks ago   Spoke with the patient.  Our [facility] had scheduled her and left a VM for her with details of appt.  Patient states her VM isn't working and she did not receive the message.  Patient was given our direct line to call when she is ready to make her [facility] appt.  She is unable to make it today due to some other appts she has.

## 2021-08-24 ENCOUNTER — Telehealth: Payer: Self-pay | Admitting: Internal Medicine

## 2021-08-24 DIAGNOSIS — F99 Mental disorder, not otherwise specified: Secondary | ICD-10-CM | POA: Diagnosis not present

## 2021-08-24 NOTE — Telephone Encounter (Signed)
1.Medication Requested: HYDROcodone-acetaminophen (NORCO/VICODIN) 5-325 MG tablet 2. Pharmacy (Name, Street, [place]): [place], [place] Phone:  [phone]  Fax:  [phone]     3. On Med List: yes   4. Last Visit with PCP:  5. Next visit date with PCP:   Agent: Please be advised that RX refills may take up to 3 business days. We ask that you follow-up with your pharmacy.

## 2021-08-25 MED ORDER — HYDROCODONE-ACETAMINOPHEN 5-325 MG PO TABS: 1.0000 | ORAL_TABLET | Freq: Two times a day (BID) | ORAL | 0 refills | Status: DC | PRN

## 2021-08-30 ENCOUNTER — Ambulatory Visit: Payer: Medicare Other | Admitting: Family Medicine

## 2021-08-30 ENCOUNTER — Ambulatory Visit (INDEPENDENT_AMBULATORY_CARE_PROVIDER_SITE_OTHER): Payer: Medicare Other | Admitting: Internal Medicine

## 2021-08-30 ENCOUNTER — Encounter: Payer: Self-pay | Admitting: Internal Medicine

## 2021-08-30 VITALS — BP 124/76 | HR 65 | Resp 18 | Ht 61.0 in | Wt 109.6 lb

## 2021-08-30 DIAGNOSIS — J439 Emphysema, unspecified: Secondary | ICD-10-CM | POA: Diagnosis not present

## 2021-08-30 DIAGNOSIS — J410 Simple chronic bronchitis: Secondary | ICD-10-CM | POA: Diagnosis not present

## 2021-08-30 DIAGNOSIS — I700 Atherosclerosis of aorta: Secondary | ICD-10-CM | POA: Diagnosis not present

## 2021-08-30 DIAGNOSIS — R7303 Prediabetes: Secondary | ICD-10-CM | POA: Diagnosis not present

## 2021-08-30 DIAGNOSIS — Z85038 Personal history of other malignant neoplasm of large intestine: Secondary | ICD-10-CM | POA: Diagnosis not present

## 2021-08-30 DIAGNOSIS — K5000 Crohn's disease of small intestine without complications: Secondary | ICD-10-CM

## 2021-08-30 LAB — CBC
HCT: 42.1 % (ref 36.0–46.0)
Hemoglobin: 13.8 g/dL (ref 12.0–15.0)
MCHC: 32.8 g/dL (ref 30.0–36.0)
MCV: 93.8 fl (ref 78.0–100.0)
Platelets: 249 10*3/uL (ref 150.0–400.0)
RBC: 4.49 Mil/uL (ref 3.87–5.11)
RDW: 13.7 % (ref 11.5–15.5)
WBC: 6.4 10*3/uL (ref 4.0–10.5)

## 2021-08-30 LAB — LIPID PANEL
Cholesterol: 168 mg/dL (ref 0–200)
HDL: 67.6 mg/dL (ref >39.00)
LDL Cholesterol: 76 mg/dL (ref 0–99)
NonHDL: 100.86
Total CHOL/HDL Ratio: 2
Triglycerides: 124 mg/dL (ref 0.0–149.0)
VLDL: 24.8 mg/dL (ref 0.0–40.0)

## 2021-08-30 LAB — COMPREHENSIVE METABOLIC PANEL
ALT: 20 U/L (ref 0–35)
AST: 23 U/L (ref 0–37)
Albumin: 4.4 g/dL (ref 3.5–5.2)
Alkaline Phosphatase: 60 U/L (ref 39–117)
BUN: 16 mg/dL (ref 6–23)
CO2: 33 mEq/L — ABNORMAL HIGH (ref 19–32)
Calcium: 10 mg/dL (ref 8.4–10.5)
Chloride: 100 mEq/L (ref 96–112)
Creatinine, Ser: 0.77 mg/dL (ref 0.40–1.20)
GFR: 77.59 mL/min (ref >60.00)
Glucose, Bld: 87 mg/dL (ref 70–99)
Potassium: 4.8 mEq/L (ref 3.5–5.1)
Sodium: 138 mEq/L (ref 135–145)
Total Bilirubin: 0.6 mg/dL (ref 0.2–1.2)
Total Protein: 6.8 g/dL (ref 6.0–8.3)

## 2021-08-30 LAB — HEMOGLOBIN A1C: Hgb A1c MFr Bld: 5.9 % (ref 4.6–6.5)

## 2021-08-30 NOTE — Assessment & Plan Note (Signed)
Still smoking about the same and feels unable to quit. SOB is more of a problem for her lately.

## 2021-08-30 NOTE — Assessment & Plan Note (Signed)
Without complication currently. Had some diarrhea with recent pulmicort which has resolved now that she stopped the medication.She is not taking medication currently.

## 2021-08-30 NOTE — Assessment & Plan Note (Signed)
Taking aspirin 81 mg daily and crestor 10 mg daily. Will continue.

## 2021-08-30 NOTE — Progress Notes (Signed)
   Subjective:   Patient ID: Erica [name], female    DOB: 1950/08/12, 71 y.o.   MRN: [id number]  HPI The patient is a 71 YO female coming in for follow up. Pulmicort nebulizer gave her diarrhea so she stopped after a few days. Did not notice improvement in breathing.  Review of Systems  Constitutional: Negative.   HENT: Negative.    Eyes: Negative.   Respiratory:  Positive for shortness of breath. Negative for cough and chest tightness.   Cardiovascular:  Negative for chest pain, palpitations and leg swelling.  Gastrointestinal:  Negative for abdominal distention, abdominal pain, constipation, diarrhea, nausea and vomiting.  Musculoskeletal:  Positive for arthralgias and back pain.  Skin: Negative.   Neurological: Negative.   Psychiatric/Behavioral: Negative.     Objective:  Physical Exam Constitutional:      Appearance: She is well-developed.  HENT:     Head: Normocephalic and atraumatic.  Cardiovascular:     Rate and Rhythm: Normal rate and regular rhythm.  Pulmonary:     Effort: Pulmonary effort is normal. No respiratory distress.     Breath sounds: Normal breath sounds. No wheezing or rales.  Abdominal:     General: Bowel sounds are normal. There is no distension.     Palpations: Abdomen is soft.     Tenderness: There is no abdominal tenderness. There is no rebound.  Musculoskeletal:     Cervical back: Normal range of motion.  Skin:    General: Skin is warm and dry.  Neurological:     Mental Status: She is alert and oriented to person, place, and time.     Coordination: Coordination normal.    Vitals:   08/30/21 0934  BP: 124/76  Pulse: 65  Resp: 18  SpO2: 96%  Weight: 109 lb 9.6 oz (49.7 kg)  Height: 5' 1"  (1.549 m)    Assessment & Plan:

## 2021-08-30 NOTE — Patient Instructions (Signed)
We have updated the medication list.

## 2021-08-30 NOTE — Assessment & Plan Note (Signed)
New diagnosis last visit and suspect repeated prednisone courses has caused this. Checking HgA1c today and adjust as needed. She does admit to having a sweet tooth and baking more than she should.

## 2021-08-30 NOTE — Assessment & Plan Note (Signed)
Due colon cancer screening 2024 reminded and she keeps up with these.

## 2021-08-30 NOTE — Assessment & Plan Note (Signed)
She was unable to tolerate pulmicort and did not notice benefit. She is unable to afford inhaler for COPD. She does struggle with breathing. Uses albuterol inhaler and nebulizer. She is still smoking and reminded to consider quitting.

## 2021-08-31 DIAGNOSIS — F99 Mental disorder, not otherwise specified: Secondary | ICD-10-CM | POA: Diagnosis not present

## 2021-09-05 ENCOUNTER — Other Ambulatory Visit: Payer: Self-pay | Admitting: Interventional Cardiology

## 2021-09-07 ENCOUNTER — Other Ambulatory Visit: Payer: Medicare Other

## 2021-09-07 DIAGNOSIS — F99 Mental disorder, not otherwise specified: Secondary | ICD-10-CM | POA: Diagnosis not present

## 2021-09-14 DIAGNOSIS — F99 Mental disorder, not otherwise specified: Secondary | ICD-10-CM | POA: Diagnosis not present

## 2021-09-20 ENCOUNTER — Telehealth: Payer: Self-pay | Admitting: Internal Medicine

## 2021-09-20 NOTE — Telephone Encounter (Signed)
Caller & Relationship to patient: Erica [name]  Call back number: [phone]  Date of last office visit: 08/30/21  Date of next office visit: 11/29/21  Medication(s) to be refilled:  HYDROcodone-acetaminophen (NORCO/VICODIN) 5-325 MG tablet      Preferred Pharmacy:  [facility] [place] Phone:  [phone]  Fax:  [phone]

## 2021-09-21 NOTE — Telephone Encounter (Signed)
Pt called in and states pharmacy has still not received rx for this medication.   Please send in ASAP.

## 2021-09-22 NOTE — Telephone Encounter (Signed)
Pt called requesting a f/u on HYDROcodone-acetaminophen (NORCO/VICODIN) 5-325 MG tablet.  FYI

## 2021-09-23 MED ORDER — HYDROCODONE-ACETAMINOPHEN 5-325 MG PO TABS: 1.0000 | ORAL_TABLET | Freq: Two times a day (BID) | ORAL | 0 refills | Status: DC | PRN

## 2021-09-23 NOTE — Telephone Encounter (Signed)
Spoke with the patient and she stated that she is not out of hydrocodone. She got her due dates missed up with her hydrocodone and her rescue inhaler. She also wanted me to make you aware that she is not taking her Mobic due to side effects that she read online.

## 2021-09-26 ENCOUNTER — Ambulatory Visit
Admission: RE | Admit: 2021-09-26 | Discharge: 2021-09-26 | Disposition: A | Discharge status: Home / Self Care | Payer: Medicare Other | Source: Ambulatory Visit | Attending: Family Medicine | Admitting: Family Medicine

## 2021-09-26 DIAGNOSIS — M5116 Intervertebral disc disorders with radiculopathy, lumbar region: Secondary | ICD-10-CM | POA: Diagnosis not present

## 2021-09-26 DIAGNOSIS — M545 Low back pain, unspecified: Secondary | ICD-10-CM

## 2021-09-26 MED ORDER — IOPAMIDOL (ISOVUE-M 200) INJECTION 41%
1.0000 mL | Freq: Once | INTRAMUSCULAR | Status: AC
  Administered 2021-09-26: 1 mL via EPIDURAL

## 2021-09-26 MED ORDER — METHYLPREDNISOLONE ACETATE 40 MG/ML INJ SUSP (RADIOLOG
80.0000 mg | Freq: Once | INTRAMUSCULAR | Status: AC
  Administered 2021-09-26: 80 mg via EPIDURAL

## 2021-09-28 DIAGNOSIS — F99 Mental disorder, not otherwise specified: Secondary | ICD-10-CM | POA: Diagnosis not present

## 2021-09-30 NOTE — Progress Notes (Deleted)
[place] Phone: [phone] Subjective:    I'm seeing this patient by the request  of:  [name], MD  CC:   [id number]  07/13/2021 Worsening symptoms at this time.  Patient has responded to epidurals in her back previously but unfortunately with patient having some weight loss, with increasing discomfort and pain, as well as affecting more of the daily activities and patient's past medical history is given for Crohn's as well as cancer.  Advanced imaging is warranted.  Last MRI was in 2018.  We may need to consider the possibility of what has progressed and what is changed.  Depending on findings we will discuss if the injections are beneficial.  Patient would be open to surgery but would like to avoid intercourse if possible.  Patient will follow-up after imaging we will discuss further treatment options.  Today patient will have a shot of Toradol and Depo-Medrol for pain relief which patient has responded to previously.    Update 10/12/2021 Erica [name] is a 71 y.o. female coming in with complaint of lumbar spine pain. Epidural 09/26/2021. Patient states   MRI lumbar 07/23/2021 IMPRESSION: Multilevel degenerative changes of the lumbar spine, most significant at L3-L4, L4-L5, and L5-S1, as summarized below:   L3-L4: Trace, grade 1 anterolisthesis. Moderate spinal canal and bilateral subarticular stenosis. Moderate-severe bilateral neural foraminal stenosis. Potential impingement of the bilateral descending and exiting nerve roots.   L4-L5: Grade 1 anterolisthesis. Moderate bilateral subarticular stenosis. Moderate-severe bilateral neural foraminal stenosis. Potential impingement of the bilateral descending and exiting nerve roots.   L5-S1: Moderate-severe bilateral neural foraminal stenosis, potentially impinging the exiting nerve roots.   Unchanged left adrenal adenoma measuring up to 3.0  cm.      Past Medical History:  Diagnosis Date   Acute duodenitis 04/24/2017   Allergy    ANEMIA    Anxiety    BACK PAIN, LUMBAR    Cancer of ascending colon pTispN0 s/p colectomy 03/05/2009 02/18/2010   Qualifier: Diagnosis of  By: Carlean Purl MD, Tonna Boehringer E    CHF (congestive heart failure) ([place])    Complete rotator cuff tear of left shoulder 11/27/2013   Ultrasound guided injection on November 27, 2013    COPD (chronic obstructive pulmonary disease) with emphysema ([place])    COPD exacerbation ([place]) 11/14/2017   Crohn's  02/2010   ileal ulcers, intol of entercort--refuses treatment   Emphysema of lung ([place])    GERD    History of blood transfusion    w/ c/s surgery   History of transfusion of whole blood    HYPERLIPIDEMIA    HYPERTENSION    Microscopic hematuria    chronic   Myocardial infarction ([place]) 2010   Obstruction of intestine or colon ([place])    adhesions   OSTEOARTHRITIS    Rectal fissure    Possible fissure   Rotator cuff tear    Takotsubo syndrome 12/2008   URINARY INCONTINENCE    Past Surgical History:  Procedure Laterality Date   ABDOMINAL HYSTERECTOMY  [place]     x 3   CHOLECYSTECTOMY  1995   COLONOSCOPY  2017   COLONOSCOPY W/ BIOPSIES AND POLYPECTOMY  01/24/2011   (Crohn's)ileitis, internal hemorrhoids   ECTOPIC PREGNANCY SURGERY     ESOPHAGOGASTRODUODENOSCOPY     FACIAL COSMETIC SURGERY     HAND SURGERY Bilateral 1991  x 2   KNEE SURGERY Right 1981   LYSIS OF ADHESION  2010   ex lap/LOA for SBO Dr Excell Seltzer   ORBITAL FRACTURE SURGERY  1990   RIGHT COLECTOMY  03/2009   Right colectomy for colon CA.  Dr Donne Hazel   TONSILLECTOMY  1952   TOTAL KNEE ARTHROPLASTY  03/2010   Dr Alvan Dame.  Depuy   UPPER GASTROINTESTINAL ENDOSCOPY  05/16/2010   normal   UTERINE SUSPENSION     VIDEO BRONCHOSCOPY Bilateral 12/18/2014   Procedure: VIDEO BRONCHOSCOPY WITHOUT FLUORO;  Surgeon: Tanda Rockers, MD;   Location: [facility];  Service: Cardiopulmonary;  Laterality: Bilateral;   Social History   Socioeconomic History   Marital status: Widowed    Spouse name: Not on file   Number of children: 5   Years of education: 14   Highest education level: High school graduate  Occupational History   Occupation: Retired     Fish farm manager: UNEMPLOYED  Tobacco Use   Smoking status: Every Day    Packs/day: 1.00    Years: 53.00    Total pack years: 53.00    Types: Cigarettes   Smokeless tobacco: Never  Vaping Use   Vaping Use: Never used  Substance and Sexual Activity   Alcohol use: Yes    Comment: occasional mixed drink   Drug use: No   Sexual activity: Not Currently    Birth control/protection: Surgical    Comment: Hysterectomy  Other Topics Concern   Not on file  Social History Narrative   Widowed; has five children, 8 grandchildren and 4 great-grandchildren.   Social Determinants of Health   Financial Resource Strain: Low Risk  (11/14/2017)   Overall Financial Resource Strain (CARDIA)    Difficulty of Paying Living Expenses: Not hard at all  Food Insecurity: No Food Insecurity (11/14/2017)   Hunger Vital Sign    Worried About Running Out of Food in the Last Year: Never true    Ran Out of Food in the Last Year: Never true  Transportation Needs: No Transportation Needs (11/14/2017)   PRAPARE - Hydrologist (Medical): No    Lack of Transportation (Non-Medical): No  Physical Activity: Unknown (11/14/2017)   Exercise Vital Sign    Days of Exercise per Week: 7 days    Minutes of Exercise per Session: Patient refused  Stress: No Stress Concern Present (11/14/2017)   [place]    Feeling of Stress : Not at all  Social Connections: [place] (11/14/2017)   Social Connection and Isolation Panel [NHANES]    Frequency of Communication with Friends and Family: More than three times a week     Frequency of Social Gatherings with Friends and Family: More than three times a week    Attends Religious Services: More than 4 times per year    Active Member of [employer] or Organizations: Yes    Attends Music therapist: More than 4 times per year    Marital Status: Married   Allergies  Allergen Reactions   Morphine Nausea And Vomiting   Family History  Problem Relation Age of Onset   Colon cancer Father 57   Hypertension Father    Heart disease Father    Kidney disease Father    Colon cancer Sister 21   Liver cancer Sister    Other Sister        amyloidosis   Throat cancer Mother    Arthritis  Other        Parent, other relative   Stomach cancer Neg Hx    Rectal cancer Neg Hx      Current Outpatient Medications (Cardiovascular):    metoprolol tartrate (LOPRESSOR) 25 MG tablet, Take 1 tablet (25 mg total) by mouth 2 (two) times daily. Pt. Needs to make an appt. With Dr. [name] in order to receive future refills. Thank You. 1st Attempt.   rosuvastatin (CRESTOR) 10 MG tablet, Take 1 tablet (10 mg total) by mouth 2 (two) times a week. Monday & Friday  Current Outpatient Medications (Respiratory):    albuterol (PROVENTIL) (2.5 MG/3ML) 0.083% nebulizer solution, Take 3 mLs (2.5 mg total) by nebulization every 6 (six) hours as needed for wheezing or shortness of breath.   albuterol (VENTOLIN HFA) 108 (90 Base) MCG/ACT inhaler, INHALE 2 PUFFS BY MOUTH EVERY 4 HOURS AS NEEDED FOR WHEEZING FOR SHORTNESS OF BREATH   fluticasone (FLONASE) 50 MCG/ACT nasal spray, Use 2 spray(s) in each nostril once daily  Current Outpatient Medications (Analgesics):    aspirin EC 81 MG tablet, Take 81 mg by mouth daily.   HYDROcodone-acetaminophen (NORCO/VICODIN) 5-325 MG tablet, Take 1 tablet by mouth 2 (two) times daily as needed for moderate pain (pain).   Current Outpatient Medications (Other):    cyclobenzaprine (FLEXERIL) 10 MG tablet, Take 1 tablet by mouth three times daily as needed  for muscle spasm (Patient taking differently: Take 5 mg by mouth as needed.)   famotidine (PEPCID) 40 MG tablet, Take 1 tablet (40 mg total) by mouth daily.   Reviewed prior external information including notes and imaging from  primary care provider As well as notes that were available from care everywhere and other healthcare systems.  Past medical history, social, surgical and family history all reviewed in electronic medical record.  No pertanent information unless stated regarding to the chief complaint.   Review of Systems:  No headache, visual changes, nausea, vomiting, diarrhea, constipation, dizziness, abdominal pain, skin rash, fevers, chills, night sweats, weight loss, swollen lymph nodes, body aches, joint swelling, chest pain, shortness of breath, mood changes. POSITIVE muscle aches  Objective  There were no vitals taken for this visit.   General: No apparent distress alert and oriented x3 mood and affect normal, dressed appropriately.  HEENT: Pupils equal, extraocular movements intact  Respiratory: Patient's speak in full sentences and does not appear short of breath  Cardiovascular: No lower extremity edema, non tender, no erythema      Impression and Recommendations:

## 2021-10-03 ENCOUNTER — Other Ambulatory Visit: Payer: Self-pay

## 2021-10-03 MED ORDER — METOPROLOL TARTRATE 25 MG PO TABS: 25.0000 mg | ORAL_TABLET | Freq: Two times a day (BID) | ORAL | 1 refills | Status: DC

## 2021-10-05 ENCOUNTER — Ambulatory Visit: Payer: Medicare Other | Admitting: Internal Medicine

## 2021-10-12 ENCOUNTER — Ambulatory Visit: Payer: Medicare Other | Admitting: Family Medicine

## 2021-10-12 DIAGNOSIS — F99 Mental disorder, not otherwise specified: Secondary | ICD-10-CM | POA: Diagnosis not present

## 2021-10-18 ENCOUNTER — Telehealth: Payer: Self-pay | Admitting: Internal Medicine

## 2021-10-18 NOTE — Telephone Encounter (Signed)
Caller & Relationship to patient: Zyon Grout  Call back number: [phone]  Date of last office visit: 08/30/21  Date of next office visit: 11/29/21  Medication(s) to be refilled: HYDROcodone-acetaminophen (NORCO/VICODIN) 5-325 MG tablet       Preferred Pharmacy:  [facility], [place] Phone:  [phone]  Fax:  [phone]

## 2021-10-18 NOTE — Telephone Encounter (Signed)
Too early request, is patient out of meds? Last 2 months has filled 4 and 3 days early.

## 2021-10-19 DIAGNOSIS — F99 Mental disorder, not otherwise specified: Secondary | ICD-10-CM | POA: Diagnosis not present

## 2021-10-20 ENCOUNTER — Other Ambulatory Visit: Payer: Self-pay | Admitting: Internal Medicine

## 2021-10-21 ENCOUNTER — Telehealth: Payer: Self-pay | Admitting: Internal Medicine

## 2021-10-21 MED ORDER — HYDROCODONE-ACETAMINOPHEN 5-325 MG PO TABS: 1.0000 | ORAL_TABLET | Freq: Two times a day (BID) | ORAL | 0 refills | Status: DC | PRN

## 2021-10-21 NOTE — Telephone Encounter (Signed)
Pt stated concerns about refills that will be out on Sunday for HYDROcodone-acetaminophen (NORCO/VICODIN) 5-325 MG tablet.       [place], [place] Phone:  [phone]  Fax:  [phone]

## 2021-10-21 NOTE — Telephone Encounter (Signed)
Spoke with the patient and she stated that she calls "3-4 days earlier due to it being a state rule". She is not out of medication. She was unaware that she is filling her medication early.

## 2021-10-25 DIAGNOSIS — F99 Mental disorder, not otherwise specified: Secondary | ICD-10-CM | POA: Diagnosis not present

## 2021-11-03 DIAGNOSIS — F99 Mental disorder, not otherwise specified: Secondary | ICD-10-CM | POA: Diagnosis not present

## 2021-11-05 NOTE — Progress Notes (Signed)
Cardiology Office Note:    Date:  11/07/2021   ID:  Erica [name], DOB 1951/02/01, MRN [id number]  PCP:  [name], MD  Cardiologist:  Sinclair Grooms, MD   Referring MD: [name], *   No chief complaint on file.   History of Present Illness:    Erica [name] is a 71 y.o. female with a hx of stress-induced cardiomyopathy 2010, heavy tobacco use , hyperlipidemia, hypertension, an history of chronic diastolic heart failure.   No anginal complaints.  Chronic shortness of breath.  No orthopnea or PND.  Continues to smoke cigarettes.  Has significant degenerative disc disease with chronic severe back pain.  She he is preparing to sell her house.  She is doing a lot of the work herself despite her back.  She continues to use aspirin and Crestor for prevention.  Metoprolol tartrate 25 mg twice daily is also being used.    Past Medical History:  Diagnosis Date   Acute duodenitis 04/24/2017   Allergy    ANEMIA    Anxiety    BACK PAIN, LUMBAR    Cancer of ascending colon pTispN0 s/p colectomy 03/05/2009 02/18/2010   Qualifier: Diagnosis of  By: Carlean Purl MD, Tonna Boehringer E    CHF (congestive heart failure) ([place])    Complete rotator cuff tear of left shoulder 11/27/2013   Ultrasound guided injection on November 27, 2013    COPD (chronic obstructive pulmonary disease) with emphysema ([place])    COPD exacerbation ([place]) 11/14/2017   Crohn's  02/2010   ileal ulcers, intol of entercort--refuses treatment   Emphysema of lung ([place])    GERD    History of blood transfusion    w/ c/s surgery   History of transfusion of whole blood    HYPERLIPIDEMIA    HYPERTENSION    Microscopic hematuria    chronic   Myocardial infarction ([place]) 2010   Obstruction of intestine or colon ([place])    adhesions   OSTEOARTHRITIS    Rectal fissure    Possible fissure   Rotator cuff tear    Takotsubo syndrome 12/2008   URINARY INCONTINENCE     Past Surgical History:  Procedure  Laterality Date   ABDOMINAL HYSTERECTOMY  1994   APPENDECTOMY  1964   BLADDER SUSPENSION     CESAREAN SECTION     x 3   CHOLECYSTECTOMY  1995   COLONOSCOPY  2017   COLONOSCOPY W/ BIOPSIES AND POLYPECTOMY  01/24/2011   (Crohn's)ileitis, internal hemorrhoids   ECTOPIC PREGNANCY SURGERY     ESOPHAGOGASTRODUODENOSCOPY     FACIAL COSMETIC SURGERY     HAND SURGERY Bilateral 1991   x 2   KNEE SURGERY Right 1981   LYSIS OF ADHESION  2010   ex lap/LOA for SBO Dr Excell Seltzer   ORBITAL FRACTURE SURGERY  1990   RIGHT COLECTOMY  03/2009   Right colectomy for colon CA.  Dr Donne Hazel   TONSILLECTOMY  1952   TOTAL KNEE ARTHROPLASTY  03/2010   Dr Alvan Dame.  Depuy   UPPER GASTROINTESTINAL ENDOSCOPY  05/16/2010   normal   UTERINE SUSPENSION     VIDEO BRONCHOSCOPY Bilateral 12/18/2014   Procedure: VIDEO BRONCHOSCOPY WITHOUT FLUORO;  Surgeon: Tanda Rockers, MD;  [facility];  Service: Cardiopulmonary;  Laterality: Bilateral;    Current Medications: Current Meds  Medication Sig   albuterol (PROVENTIL) (2.5 MG/3ML) 0.083% nebulizer solution Take 3 mLs (2.5 mg total) by nebulization every 6 (six) hours as needed for  wheezing or shortness of breath.   albuterol (VENTOLIN HFA) 108 (90 Base) MCG/ACT inhaler INHALE 2 PUFFS BY MOUTH EVERY 4 HOURS AS NEEDED FOR WHEEZING FOR SHORTNESS OF BREATH   aspirin EC 81 MG tablet Take 81 mg by mouth daily.   cyclobenzaprine (FLEXERIL) 10 MG tablet Take 1 tablet by mouth three times daily as needed for muscle spasm (Patient taking differently: Take 5 mg by mouth as needed.)   fluticasone (FLONASE) 50 MCG/ACT nasal spray Use 2 spray(s) in each nostril once daily   HYDROcodone-acetaminophen (NORCO/VICODIN) 5-325 MG tablet Take 1 tablet by mouth 2 (two) times daily as needed for moderate pain (pain).   rosuvastatin (CRESTOR) 10 MG tablet TAKE ONE TABLET BY MOUTH FIVE TIMES A WEEK ON MONDAY-FRIDAY   [DISCONTINUED] metoprolol tartrate (LOPRESSOR) 25 MG tablet Take 1  tablet (25 mg total) by mouth 2 (two) times daily.     Allergies:   Morphine   Social History   Socioeconomic History   Marital status: Widowed    Spouse name: Not on file   Number of children: 5   Years of education: 14   Highest education level: High school graduate  Occupational History   Occupation: Retired     Fish farm manager: UNEMPLOYED  Tobacco Use   Smoking status: Every Day    Packs/day: 1.00    Years: 53.00    Total pack years: 53.00    Types: Cigarettes   Smokeless tobacco: Never  Vaping Use   Vaping Use: Never used  Substance and Sexual Activity   Alcohol use: Yes    Comment: occasional mixed drink   Drug use: No   Sexual activity: Not Currently    Birth control/protection: Surgical    Comment: Hysterectomy  Other Topics Concern   Not on file  Social History Narrative   Widowed; has five children, 8 grandchildren and 4 great-grandchildren.   Social Determinants of Health   Financial Resource Strain: Low Risk  (11/14/2017)   Overall Financial Resource Strain (CARDIA)    Difficulty of Paying Living Expenses: Not hard at all  Food Insecurity: No Food Insecurity (11/14/2017)   Hunger Vital Sign    Worried About Running Out of Food in the Last Year: Never true    Ran Out of Food in the Last Year: Never true  Transportation Needs: No Transportation Needs (11/14/2017)   PRAPARE - Hydrologist (Medical): No    Lack of Transportation (Non-Medical): No  Physical Activity: Unknown (11/14/2017)   Exercise Vital Sign    Days of Exercise per Week: 7 days    Minutes of Exercise per Session: Patient refused  Stress: No Stress Concern Present (11/14/2017)   [place]    Feeling of Stress : Not at all  Social Connections: [place] (11/14/2017)   Social Connection and Isolation Panel [NHANES]    Frequency of Communication with Friends and Family: More than three times a  week    Frequency of Social Gatherings with Friends and Family: More than three times a week    Attends Religious Services: More than 4 times per year    Active Member of [employer] or Organizations: Yes    Attends Music therapist: More than 4 times per year    Marital Status: Married     Family History: The patient's family history includes Arthritis in an other family member; Colon cancer (age of onset: 49) in her sister; Colon cancer (  age of onset: 72) in her father; Heart disease in her father; Hypertension in her father; Kidney disease in her father; Liver cancer in her sister; Other in her sister; Throat cancer in her mother. There is no history of Stomach cancer or Rectal cancer.  ROS:   Please see the history of present illness.    No claudication.  No cough.  No bleeding from Crohn's.  All other systems reviewed and are negative.  EKGs/Labs/Other Studies Reviewed:    The following studies were reviewed today:  ECHOCARDIOGRAM 2015: Study Conclusions   - Left ventricle: The cavity size was normal. Systolic function was    normal. The estimated ejection fraction was in the range of 60%    to 65%. Doppler parameters are consistent with abnormal left    ventricular relaxation (grade 1 diastolic dysfunction).   EKG:  EKG performed on 01/05/2021 demonstrated sinus rhythm, low voltage, left atrial abnormality.  Recent Labs: 01/05/2021: Magnesium 1.7 08/30/2021: ALT 20; BUN 16; Creatinine, Ser 0.77; Hemoglobin 13.8; Platelets 249.0; Potassium 4.8; Sodium 138  Recent Lipid Panel    Component Value Date/Time   CHOL 168 08/30/2021 1003   CHOL 183 08/12/2020 0901   TRIG 124.0 08/30/2021 1003   HDL 67.60 08/30/2021 1003   HDL 62 08/12/2020 0901   CHOLHDL 2 08/30/2021 1003   VLDL 24.8 08/30/2021 1003   LDLCALC 76 08/30/2021 1003   LDLCALC 99 08/12/2020 0901   LDLDIRECT 138.5 09/30/2012 1518    Physical Exam:    VS:  BP 130/76   Pulse 79   Ht 5' 1"  (1.549 m)   Wt  109 lb (49.4 kg)   LMP  (LMP Unknown)   SpO2 94%   BMI 20.60 kg/m     Wt Readings from Last 3 Encounters:  11/07/21 109 lb (49.4 kg)  08/30/21 109 lb 9.6 oz (49.7 kg)  08/05/21 109 lb 6.4 oz (49.6 kg)     GEN: Weight is stable BMI is 20.6.[name] No acute distress HEENT: Normal NECK: No JVD. LYMPHATICS: No lymphadenopathy CARDIAC: No murmur. RRR no gallop, or edema. VASCULAR:  Normal Pulses. No bruits. RESPIRATORY:  Clear to auscultation without rales, wheezing or rhonchi  ABDOMEN: Soft, non-tender, non-distended, No pulsatile mass, MUSCULOSKELETAL: No deformity  SKIN: Warm and dry NEUROLOGIC:  Alert and oriented x 3 PSYCHIATRIC:  Normal affect   ASSESSMENT:    1. Coronary artery calcification seen on CAT scan   2. Chronic diastolic HF (heart failure) ([place])   3. Mixed hyperlipidemia   4. Essential hypertension   5. Tobacco abuse    PLAN:    In order of problems listed above:  Secondary prevention.  Her acute ischemic event was felt to be stress cardiomyopathy.  No recurrence. No volume overload. Continue moderate intensity statin therapy.  LDL 76 in May. Blood pressure control is excellent.  Continue metoprolol. Encouraged smoking cessation.  Overall education and awareness concerning /secondary risk prevention was discussed in detail: LDL less than 70, hemoglobin A1c less than 7, blood pressure target less than 130/80 mmHg, >150 minutes of moderate aerobic activity per week, avoidance of smoking, weight control (via diet and exercise), and continued surveillance/management of/for obstructive sleep apnea.    Medication Adjustments/Labs and Tests Ordered: Current medicines are reviewed at length with the patient today.  Concerns regarding medicines are outlined above.  No orders of the defined types were placed in this encounter.  Meds ordered this encounter  Medications   metoprolol tartrate (LOPRESSOR) 25 MG tablet  Sig: Take 1 tablet (25 mg total) by mouth 2 (two)  times daily.    Dispense:  90 tablet    Refill:  3    Pt must keep upcoming appt in August 2023 with Dr. [name] before anymore refills. Thank you Final Attempt    Patient Instructions  Medication Instructions:  Your physician recommends that you continue on your current medications as directed. Please refer to the Current Medication list given to you today.  *If you need a refill on your cardiac medications before your next appointment, please call your pharmacy*  Lab Work: NONE  Testing/Procedures: NONE  Follow-Up: At [facility], you and your health needs are our priority.  As part of our continuing mission to provide you with exceptional heart care, we have created designated Provider Care Teams.  These Care Teams include your primary Cardiologist (physician) and Advanced Practice Providers (APPs -  Physician Assistants and Nurse Practitioners) who all work together to provide you with the care you need, when you need it.  Your next appointment:   1 year(s)  The format for your next appointment:   In Person  Provider:   Sinclair Grooms, MD {   Important Information About Sugar         Signed, Sinclair Grooms, MD  11/07/2021 5:02 PM    [place]

## 2021-11-07 ENCOUNTER — Encounter: Payer: Self-pay | Admitting: Interventional Cardiology

## 2021-11-07 ENCOUNTER — Ambulatory Visit (INDEPENDENT_AMBULATORY_CARE_PROVIDER_SITE_OTHER): Payer: Medicare Other | Admitting: Interventional Cardiology

## 2021-11-07 VITALS — BP 130/76 | HR 79 | Ht 61.0 in | Wt 109.0 lb

## 2021-11-07 DIAGNOSIS — E782 Mixed hyperlipidemia: Secondary | ICD-10-CM

## 2021-11-07 DIAGNOSIS — I10 Essential (primary) hypertension: Secondary | ICD-10-CM | POA: Diagnosis not present

## 2021-11-07 DIAGNOSIS — I5032 Chronic diastolic (congestive) heart failure: Secondary | ICD-10-CM

## 2021-11-07 DIAGNOSIS — I2510 Atherosclerotic heart disease of native coronary artery without angina pectoris: Secondary | ICD-10-CM | POA: Diagnosis not present

## 2021-11-07 DIAGNOSIS — Z720 Tobacco use: Secondary | ICD-10-CM

## 2021-11-07 MED ORDER — METOPROLOL TARTRATE 25 MG PO TABS: 25.0000 mg | ORAL_TABLET | Freq: Two times a day (BID) | ORAL | 3 refills | Status: DC

## 2021-11-07 NOTE — Patient Instructions (Signed)
Medication Instructions:  Your physician recommends that you continue on your current medications as directed. Please refer to the Current Medication list given to you today.  *If you need a refill on your cardiac medications before your next appointment, please call your pharmacy*  Lab Work: NONE  Testing/Procedures: NONE  Follow-Up: At [facility], you and your health needs are our priority.  As part of our continuing mission to provide you with exceptional heart care, we have created designated Provider Care Teams.  These Care Teams include your primary Cardiologist (physician) and Advanced Practice Providers (APPs -  Physician Assistants and Nurse Practitioners) who all work together to provide you with the care you need, when you need it.  Your next appointment:   1 year(s)  The format for your next appointment:   In Person  Provider:   Sinclair Grooms, MD {   Important Information About Sugar

## 2021-11-10 DIAGNOSIS — F99 Mental disorder, not otherwise specified: Secondary | ICD-10-CM | POA: Diagnosis not present

## 2021-11-14 ENCOUNTER — Ambulatory Visit (INDEPENDENT_AMBULATORY_CARE_PROVIDER_SITE_OTHER): Payer: Medicare Other

## 2021-11-14 DIAGNOSIS — Z0000 Encounter for general adult medical examination without abnormal findings: Secondary | ICD-10-CM | POA: Diagnosis not present

## 2021-11-14 DIAGNOSIS — Z1231 Encounter for screening mammogram for malignant neoplasm of breast: Secondary | ICD-10-CM | POA: Diagnosis not present

## 2021-11-14 NOTE — Patient Instructions (Signed)
Erica [name] , Thank you for taking time to come for your Medicare Wellness Visit. I appreciate your ongoing commitment to your health goals. Please review the following plan we discussed and let me know if I can assist you in the future.   Screening recommendations/referrals: Colonoscopy: 06/27/2019 Mammogram: referral 11/14/2021 Bone Density: 12/22/2016 Recommended yearly ophthalmology/optometry visit for glaucoma screening and checkup Recommended yearly dental visit for hygiene and checkup  Vaccinations: Influenza vaccine: completed  Pneumococcal vaccine: completed  Tdap vaccine: 09/17/2014 Shingles vaccine: will consider     Advanced directives: yes   Conditions/risks identified: none   Next appointment: none    Preventive Care 16 Years and Older, Female Preventive care refers to lifestyle choices and visits with your health care provider that can promote health and wellness. What does preventive care include? A yearly physical exam. This is also called an annual well check. Dental exams once or twice a year. Routine eye exams. Ask your health care provider how often you should have your eyes checked. Personal lifestyle choices, including: Daily care of your teeth and gums. Regular physical activity. Eating a healthy diet. Avoiding tobacco and drug use. Limiting alcohol use. Practicing safe sex. Taking low-dose aspirin every day. Taking vitamin and mineral supplements as recommended by your health care provider. What happens during an annual well check? The services and screenings done by your health care provider during your annual well check will depend on your age, overall health, lifestyle risk factors, and family history of disease. Counseling  Your health care provider may ask you questions about your: Alcohol use. Tobacco use. Drug use. Emotional well-being. Home and relationship well-being. Sexual activity. Eating habits. History of falls. Memory and  ability to understand (cognition). Work and work Statistician. Reproductive health. Screening  You may have the following tests or measurements: Height, weight, and BMI. Blood pressure. Lipid and cholesterol levels. These may be checked every 5 years, or more frequently if you are over 13 years old. Skin check. Lung cancer screening. You may have this screening every year starting at age 38 if you have a 30-pack-year history of smoking and currently smoke or have quit within the past 15 years. Fecal occult blood test (FOBT) of the stool. You may have this test every year starting at age 43. Flexible sigmoidoscopy or colonoscopy. You may have a sigmoidoscopy every 5 years or a colonoscopy every 10 years starting at age 2. Hepatitis C blood test. Hepatitis B blood test. Sexually transmitted disease (STD) testing. Diabetes screening. This is done by checking your blood sugar (glucose) after you have not eaten for a while (fasting). You may have this done every 1-3 years. Bone density scan. This is done to screen for osteoporosis. You may have this done starting at age 49. Mammogram. This may be done every 1-2 years. Talk to your health care provider about how often you should have regular mammograms. Talk with your health care provider about your test results, treatment options, and if necessary, the need for more tests. Vaccines  Your health care provider may recommend certain vaccines, such as: Influenza vaccine. This is recommended every year. Tetanus, diphtheria, and acellular pertussis (Tdap, Td) vaccine. You may need a Td booster every 10 years. Zoster vaccine. You may need this after age 110. Pneumococcal 13-valent conjugate (PCV13) vaccine. One dose is recommended after age 65. Pneumococcal polysaccharide (PPSV23) vaccine. One dose is recommended after age 81. Talk to your health care provider about which screenings and vaccines you need and how  often you need them. This information is  not intended to replace advice given to you by your health care provider. Make sure you discuss any questions you have with your health care provider. Document Released: 04/16/2015 Document Revised: 12/08/2015 Document Reviewed: 01/19/2015 Elsevier Interactive Patient Education  2017 [place] Prevention in the Home Falls can cause injuries. They can happen to people of all ages. There are many things you can do to make your home safe and to help prevent falls. What can I do on the outside of my home? Regularly fix the edges of walkways and driveways and fix any cracks. Remove anything that might make you trip as you walk through a door, such as a raised step or threshold. Trim any bushes or trees on the path to your home. Use bright outdoor lighting. Clear any walking paths of anything that might make someone trip, such as rocks or tools. Regularly check to see if handrails are loose or broken. Make sure that both sides of any steps have handrails. Any raised decks and porches should have guardrails on the edges. Have any leaves, snow, or ice cleared regularly. Use sand or salt on walking paths during winter. Clean up any spills in your garage right away. This includes oil or grease spills. What can I do in the bathroom? Use night lights. Install grab bars by the toilet and in the tub and shower. Do not use towel bars as grab bars. Use non-skid mats or decals in the tub or shower. If you need to sit down in the shower, use a plastic, non-slip stool. Keep the floor dry. Clean up any water that spills on the floor as soon as it happens. Remove soap buildup in the tub or shower regularly. Attach bath mats securely with double-sided non-slip rug tape. Do not have throw rugs and other things on the floor that can make you trip. What can I do in the bedroom? Use night lights. Make sure that you have a light by your bed that is easy to reach. Do not use any sheets or blankets that  are too big for your bed. They should not hang down onto the floor. Have a firm chair that has side arms. You can use this for support while you get dressed. Do not have throw rugs and other things on the floor that can make you trip. What can I do in the kitchen? Clean up any spills right away. Avoid walking on wet floors. Keep items that you use a lot in easy-to-reach places. If you need to reach something above you, use a strong step stool that has a grab bar. Keep electrical cords out of the way. Do not use floor polish or wax that makes floors slippery. If you must use wax, use non-skid floor wax. Do not have throw rugs and other things on the floor that can make you trip. What can I do with my stairs? Do not leave any items on the stairs. Make sure that there are handrails on both sides of the stairs and use them. Fix handrails that are broken or loose. Make sure that handrails are as long as the stairways. Check any carpeting to make sure that it is firmly attached to the stairs. Fix any carpet that is loose or worn. Avoid having throw rugs at the top or bottom of the stairs. If you do have throw rugs, attach them to the floor with carpet tape. Make sure that you have a  light switch at the top of the stairs and the bottom of the stairs. If you do not have them, ask someone to add them for you. What else can I do to help prevent falls? Wear shoes that: Do not have high heels. Have rubber bottoms. Are comfortable and fit you well. Are closed at the toe. Do not wear sandals. If you use a stepladder: Make sure that it is fully opened. Do not climb a closed stepladder. Make sure that both sides of the stepladder are locked into place. Ask someone to hold it for you, if possible. Clearly mark and make sure that you can see: Any grab bars or handrails. First and last steps. Where the edge of each step is. Use tools that help you move around (mobility aids) if they are needed. These  include: Canes. Walkers. Scooters. Crutches. Turn on the lights when you go into a dark area. Replace any light bulbs as soon as they burn out. Set up your furniture so you have a clear path. Avoid moving your furniture around. If any of your floors are uneven, fix them. If there are any pets around you, be aware of where they are. Review your medicines with your doctor. Some medicines can make you feel dizzy. This can increase your chance of falling. Ask your doctor what other things that you can do to help prevent falls. This information is not intended to replace advice given to you by your health care provider. Make sure you discuss any questions you have with your health care provider. Document Released: 01/14/2009 Document Revised: 08/26/2015 Document Reviewed: 04/24/2014 Elsevier Interactive Patient Education  2017 [employer].

## 2021-11-14 NOTE — Progress Notes (Signed)
Subjective:   Erica [name] is a 71 y.o. female who presents for Medicare Annual (Subsequent) preventive examination.   I connected with [name]  today by telephone and verified that I am speaking with the correct person using two identifiers. Location patient: home Location provider: work Persons participating in the virtual visit: patient, provider.   I discussed the limitations, risks, security and privacy concerns of performing an evaluation and management service by telephone and the availability of in person appointments. I also discussed with the patient that there may be a patient responsible charge related to this service. The patient expressed understanding and verbally consented to this telephonic visit.    Interactive audio and video telecommunications were attempted between this provider and patient, however failed, due to patient having technical difficulties OR patient did not have access to video capability.  We continued and completed visit with audio only.    Review of Systems     Cardiac Risk Factors include: advanced age (>66mn, >>37women)     Objective:    Today's Vitals   There is no height or weight on file to calculate BMI.     11/14/2021    9:20 AM 01/05/2021    4:09 PM 09/05/2019   10:31 AM 04/24/2018    8:33 PM 04/24/2018    4:39 PM 11/14/2017    4:10 PM 02/14/2017    1:39 PM  Advanced Directives  Does Patient Have a Medical Advance Directive? Yes No No No No No No  Type of [place]        Copy of [place] Chart? No - copy requested        Would patient like information on creating a medical advance directive?  No - Patient declined No - Patient declined No - Patient declined No - Patient declined No - Patient declined No - Patient declined    Current Medications (verified) Outpatient Encounter Medications as of 11/14/2021  Medication Sig   albuterol (PROVENTIL)  (2.5 MG/3ML) 0.083% nebulizer solution Take 3 mLs (2.5 mg total) by nebulization every 6 (six) hours as needed for wheezing or shortness of breath.   albuterol (VENTOLIN HFA) 108 (90 Base) MCG/ACT inhaler INHALE 2 PUFFS BY MOUTH EVERY 4 HOURS AS NEEDED FOR WHEEZING FOR SHORTNESS OF BREATH   aspirin EC 81 MG tablet Take 81 mg by mouth daily.   cyclobenzaprine (FLEXERIL) 10 MG tablet Take 1 tablet by mouth three times daily as needed for muscle spasm (Patient taking differently: Take 5 mg by mouth as needed.)   fluticasone (FLONASE) 50 MCG/ACT nasal spray Use 2 spray(s) in each nostril once daily   HYDROcodone-acetaminophen (NORCO/VICODIN) 5-325 MG tablet Take 1 tablet by mouth 2 (two) times daily as needed for moderate pain (pain).   metoprolol tartrate (LOPRESSOR) 25 MG tablet Take 1 tablet (25 mg total) by mouth 2 (two) times daily.   rosuvastatin (CRESTOR) 10 MG tablet TAKE ONE TABLET BY MOUTH FIVE TIMES A WEEK ON MONDAY-FRIDAY   No facility-administered encounter medications on file as of 11/14/2021.    Allergies (verified) Morphine   History: Past Medical History:  Diagnosis Date   Acute duodenitis 04/24/2017   Allergy    ANEMIA    Anxiety    BACK PAIN, LUMBAR    Cancer of ascending colon [id number] s/p colectomy 03/05/2009 02/18/2010   Qualifier: Diagnosis of  By: GCarlean PurlMD, FTonna BoehringerE    [place]estive heart [place]  Complete rotator cuff tear of left shoulder 11/27/2013   Ultrasound guided injection on November 27, 2013    COPD (chronic obstructive pulmonary disease) with emphysema ([place])    COPD exacerbation ([place]) 11/14/2017   Crohn's  02/2010   ileal ulcers, intol of entercort--refuses treatment   Emphysema of lung ([place])    GERD    History of blood transfusion    w/ c/s surgery   History of transfusion of whole blood    HYPERLIPIDEMIA    HYPERTENSION    Microscopic hematuria    chronic   Myocardial infarction ([place]) 2010   Obstruction of intestine or colon ([place])     adhesions   OSTEOARTHRITIS    Rectal fissure    Possible fissure   Rotator cuff tear    Takotsubo syndrome 12/2008   URINARY INCONTINENCE    Past Surgical History:  Procedure Laterality Date   ABDOMINAL HYSTERECTOMY  1994   APPENDECTOMY  1964   BLADDER SUSPENSION     CESAREAN SECTION     x 3   CHOLECYSTECTOMY  1995   COLONOSCOPY  2017   COLONOSCOPY W/ BIOPSIES AND POLYPECTOMY  01/24/2011   (Crohn's)ileitis, internal hemorrhoids   ECTOPIC PREGNANCY SURGERY     ESOPHAGOGASTRODUODENOSCOPY     FACIAL COSMETIC SURGERY     HAND SURGERY Bilateral 1991   x 2   KNEE SURGERY Right 1981   LYSIS OF ADHESION  2010   ex lap/LOA for SBO Dr Excell Seltzer   ORBITAL FRACTURE SURGERY  1990   RIGHT COLECTOMY  03/2009   Right colectomy for colon CA.  Dr Donne Hazel   TONSILLECTOMY  1952   TOTAL KNEE ARTHROPLASTY  03/2010   Dr Alvan Dame.  Depuy   UPPER GASTROINTESTINAL ENDOSCOPY  05/16/2010   normal   UTERINE SUSPENSION     VIDEO BRONCHOSCOPY Bilateral 12/18/2014   Procedure: VIDEO BRONCHOSCOPY WITHOUT FLUORO;  Surgeon: Tanda Rockers, MD;  Location: [facility];  Service: Cardiopulmonary;  Laterality: Bilateral;   Family History  Problem Relation Age of Onset   Colon cancer Father 62   Hypertension Father    Heart disease Father    Kidney disease Father    Colon cancer Sister 26   Liver cancer Sister    Other Sister        amyloidosis   Throat cancer Mother    Arthritis Other        Parent, other relative   Stomach cancer Neg Hx    Rectal cancer Neg Hx    Social History   Socioeconomic History   Marital status: Widowed    Spouse name: Not on file   Number of children: 5   Years of education: 14   Highest education level: High school graduate  Occupational History   Occupation: Retired     Fish farm manager: UNEMPLOYED  Tobacco Use   Smoking status: Every Day    Packs/day: 1.00    Years: 53.00    Total pack years: 53.00    Types: Cigarettes   Smokeless tobacco: Never  Vaping Use    Vaping Use: Never used  Substance and Sexual Activity   Alcohol use: Yes    Comment: occasional mixed drink   Drug use: No   Sexual activity: Not Currently    Birth control/protection: Surgical    Comment: Hysterectomy  Other Topics Concern   Not on file  Social History Narrative   Widowed; has five children, 8 grandchildren and 4 great-grandchildren.   Social Determinants of Health  Financial Resource Strain: Low Risk  (11/14/2021)   Overall Financial Resource Strain (CARDIA)    Difficulty of Paying Living Expenses: Not hard at all  Food Insecurity: No Food Insecurity (11/14/2021)   Hunger Vital Sign    Worried About Running Out of Food in the Last Year: Never true    Ran Out of Food in the Last Year: Never true  Transportation Needs: No Transportation Needs (11/14/2021)   PRAPARE - Hydrologist (Medical): No    Lack of Transportation (Non-Medical): No  Physical Activity: Sufficiently Active (11/14/2021)   Exercise Vital Sign    Days of Exercise per Week: 5 days    Minutes of Exercise per Session: 30 min  Stress: No Stress Concern Present (11/14/2021)   [place]    Feeling of Stress : Not at all  Social Connections: Moderately Integrated (11/14/2021)   Social Connection and Isolation Panel [NHANES]    Frequency of Communication with Friends and Family: Three times a week    Frequency of Social Gatherings with Friends and Family: Three times a week    Attends Religious Services: More than 4 times per year    Active Member of Clubs or Organizations: Yes    Attends Archivist Meetings: More than 4 times per year    Marital Status: Widowed    Tobacco Counseling Ready to quit: Not Answered Counseling given: Not Answered   Clinical Intake:  Pre-visit preparation completed: Yes  Pain : No/denies pain     Nutritional Risks: None Diabetes: No  How often do you need  to have someone help you when you read instructions, pamphlets, or other written materials from your doctor or pharmacy?: 1 - Never What is the last grade level you completed in school?: college  Diabetic?no   Interpreter Needed?: No  Information entered by :: L.Wilson,LPN   Activities of Daily Living    11/14/2021    9:26 AM  In your present state of health, do you have any difficulty performing the following activities:  Hearing? 0  Vision? 0  Difficulty concentrating or making decisions? 0  Walking or climbing stairs? 0  Dressing or bathing? 0  Doing errands, shopping? 0  Preparing Food and eating ? N  Using the Toilet? N  In the past six months, have you accidently leaked urine? N  Do you have problems with loss of bowel control? N  Managing your Medications? N  Managing your Finances? N  Housekeeping or managing your Housekeeping? N    Patient Care Team: [name], MD as PCP - General (Internal Medicine) Belva Crome, MD as PCP - Cardiology (Cardiology) Gatha Mayer, MD as Consulting Physician (Gastroenterology) Paralee Cancel, MD as Consulting Physician (Orthopedic Surgery) Melina Schools, MD as Consulting Physician (Orthopedic Surgery) Kristeen Miss, MD as Consulting Physician (Neurosurgery) Belva Crome, MD as Consulting Physician (Cardiology) Lyndal Pulley, DO as Consulting Physician (Pain Medicine) Bo Merino, MD as Consulting Physician (Rheumatology) Tanda Rockers, MD as Consulting Physician (Pulmonary Disease) Charlton Haws, [facility] as Pharmacist (Pharmacist)  Indicate any recent Medical Services you may have received from other than [facility] providers in the past year (date may be approximate).     Assessment:   This is a routine wellness examination for Erica [name].  Hearing/Vision screen Vision Screening - Comments:: Annual eye exams wear glasses   Dietary issues and exercise activities discussed: Current Exercise Habits:  Home exercise  routine, Type of exercise: walking, Time (Minutes): 30, Frequency (Times/Week): 5, Weekly Exercise (Minutes/Week): 150, Intensity: Mild, Exercise limited by: None identified   Goals Addressed   None    Depression Screen    11/14/2021    9:21 AM 11/14/2021    9:17 AM 07/26/2021   10:17 AM 05/30/2021   10:10 AM 09/29/2020    9:38 AM 09/05/2019   10:33 AM 06/16/2019    8:53 AM  PHQ 2/9 Scores  PHQ - 2 Score 0 0 0 0 0 0 0  PHQ- 9 Score   0        Fall Risk    11/14/2021    9:21 AM 07/26/2021   10:18 AM 05/30/2021   10:10 AM 09/29/2020    9:39 AM 09/05/2019   10:32 AM  Fall Risk   Falls in the past year? 0 0 0 0 0  Number falls in past yr: 0 0 0 0 0  Injury with Fall? 0 0 0 0 0  Risk for fall due to :     No Fall Risks  Follow up Falls evaluation completed;Education provided    Falls evaluation completed;Education provided    FALL RISK PREVENTION PERTAINING TO THE HOME:  Any stairs in or around the home? Yes  If so, are there any without handrails? No  Home free of loose throw rugs in walkways, pet beds, electrical cords, etc? Yes  Adequate lighting in your home to reduce risk of falls? Yes   ASSISTIVE DEVICES UTILIZED TO PREVENT FALLS:  Life alert? No  Use of a cane, walker or w/c? No  Grab bars in the bathroom? No  Shower chair or bench in shower? No  Elevated toilet seat or a handicapped toilet? No    Cognitive Function:    Normal cognitive status assessed by telephone conversation  by this Nurse Health Advisor. No abnormalities found.      Immunizations Immunization History  Administered Date(s) Administered   Fluad Quad(high Dose 65+) 12/05/2018   Influenza Split 01/06/2009, 02/27/2011   Influenza Whole 02/18/2010   Influenza, High Dose Seasonal PF 02/20/2017, 02/08/2018   Influenza,inj,Quad PF,6+ Mos 01/22/2013   PFIZER(Purple Top)SARS-COV-2 Vaccination 07/10/2019, 08/04/2019, 03/13/2020, 11/25/2020   Pneumococcal Conjugate-13 03/22/2015    Pneumococcal Polysaccharide-23 04/03/2008, 09/18/2016   Td 04/03/2004   Tdap 09/17/2014   Zoster, Live 11/18/2010    TDAP status: Up to date  Flu Vaccine status: Up to date  Pneumococcal vaccine status: Up to date  Covid-19 vaccine status: Completed vaccines  Qualifies for Shingles Vaccine? Yes   Zostavax completed No   Shingrix Completed?: No.    Education has been provided regarding the importance of this vaccine. Patient has been advised to call insurance company to determine out of pocket expense if they have not yet received this vaccine. Advised may also receive vaccine at local pharmacy or Health Dept. Verbalized acceptance and understanding.  Screening Tests Health Maintenance  Topic Date Due   Hepatitis C Screening  Never done   Zoster Vaccines- Shingrix (1 of 2) Never done   MAMMOGRAM  02/07/2013   COVID-19 Vaccine (5 - Pfizer series) 01/20/2021   INFLUENZA VACCINE  11/01/2021   COLONOSCOPY (Pts 45-48yr Insurance coverage will need to be confirmed)  06/27/2022   TETANUS/TDAP  09/16/2024   Pneumonia Vaccine 72 Years old  Completed   DEXA SCAN  Completed   HPV VACCINES  Aged Out    Health Maintenance  Health Maintenance Due  Topic Date Due  Hepatitis C Screening  Never done   Zoster Vaccines- Shingrix (1 of 2) Never done   MAMMOGRAM  02/07/2013   COVID-19 Vaccine (5 - Pfizer series) 01/20/2021   INFLUENZA VACCINE  11/01/2021    Colorectal cancer screening: Type of screening: Colonoscopy. Completed 06/27/2019. Repeat every 3 years  Mammogram status: Ordered 11/14/2021. Pt provided with contact info and advised to call to schedule appt.   Bone Density status: Completed 12/22/2016. Results reflect: Bone density results: OSTEOPENIA. Repeat every 5 years.  Lung Cancer Screening: (Low Dose CT Chest recommended if Age 87-80 years, 30 pack-year currently smoking OR have quit w/in 15years.) does not qualify.   Lung Cancer Screening Referral: n/a  Additional  Screening:  Hepatitis C Screening: does not qualify;   Vision Screening: Recommended annual ophthalmology exams for early detection of glaucoma and other disorders of the eye. Is the patient up to date with their annual eye exam?  Yes  Who is the provider or what is the name of the office in which the patient attends annual eye exams? [facility]  If pt is not established with a provider, would they like to be referred to a provider to establish care? No .   Dental Screening: Recommended annual dental exams for proper oral hygiene  Community Resource Referral / Chronic Care Management: CRR required this visit?  No   CCM required this visit?  No      Plan:     I have personally reviewed and noted the following in the patient's chart:   Medical and social history Use of alcohol, tobacco or illicit drugs  Current medications and supplements including opioid prescriptions.  Functional ability and status Nutritional status Physical activity Advanced directives List of other physicians Hospitalizations, surgeries, and ER visits in previous 12 months Vitals Screenings to include cognitive, depression, and falls Referrals and appointments  In addition, I have reviewed and discussed with patient certain preventive protocols, quality metrics, and best practice recommendations. A written personalized care plan for preventive services as well as general preventive health recommendations were provided to patient.     Daphane Shepherd, LPN   05/17/863   Nurse Notes: none

## 2021-11-17 ENCOUNTER — Telehealth: Payer: Self-pay | Admitting: Internal Medicine

## 2021-11-17 DIAGNOSIS — M5010 Cervical disc disorder with radiculopathy, unspecified cervical region: Secondary | ICD-10-CM

## 2021-11-17 DIAGNOSIS — J439 Emphysema, unspecified: Secondary | ICD-10-CM

## 2021-11-17 NOTE — Telephone Encounter (Signed)
This is not an optional request she has agreed to UDS monitoring when required. This request was for UDS within 24 hours she will need to fulfill.

## 2021-11-17 NOTE — Telephone Encounter (Signed)
Pt is requesting a refill of her HYDROcodone-acetaminophen (NORCO/VICODIN) 5-325 MG tablet.  Please send RX to [place]  Phone:  [phone]  Fax:  [phone]

## 2021-11-17 NOTE — Telephone Encounter (Signed)
Recently is filling early every month. Please call back needs UDS within 24 hours. Order placed can get today or tomorrow.

## 2021-11-21 NOTE — Telephone Encounter (Signed)
Called pt and and informed her that she will need to come in for UDS monitoring and that our lab closes at 4:30 pm today. Pt verbalized frustration as she is upset she has to do this every month and states that she is very busy at this time and will not be coming in for the screening. Pt states that if she is unable to get her medication because of this, then to cancel her appt on 11/29/21.   Please advise and I can cancel appt if needed.

## 2021-11-21 NOTE — Telephone Encounter (Signed)
Called pt to let her know medication will not be sent in without UDS and that I will be cancelling her appt per her request. Pt verbalized understanding.

## 2021-11-21 NOTE — Telephone Encounter (Signed)
Her last UDS was 2020 so this is clearly due. It is standard to do urine monitoring at least yearly so if she does not want to participate in this I feel comfortable not prescribing this medication any longer.

## 2021-11-28 ENCOUNTER — Ambulatory Visit (INDEPENDENT_AMBULATORY_CARE_PROVIDER_SITE_OTHER): Payer: Medicare Other | Admitting: Sports Medicine

## 2021-11-28 VITALS — BP 110/80 | Ht 61.0 in | Wt 110.0 lb

## 2021-11-28 DIAGNOSIS — M545 Low back pain, unspecified: Secondary | ICD-10-CM | POA: Diagnosis not present

## 2021-11-28 DIAGNOSIS — M542 Cervicalgia: Secondary | ICD-10-CM

## 2021-11-28 MED ORDER — METHYLPREDNISOLONE ACETATE 80 MG/ML IJ SUSP
80.0000 mg | Freq: Once | INTRAMUSCULAR | Status: AC
  Administered 2021-11-28: 80 mg via INTRAMUSCULAR

## 2021-11-28 MED ORDER — KETOROLAC TROMETHAMINE 60 MG/2ML IM SOLN
60.0000 mg | Freq: Once | INTRAMUSCULAR | Status: AC
  Administered 2021-11-28: 60 mg via INTRAMUSCULAR
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
## 2021-11-28 NOTE — Patient Instructions (Signed)
Good to see you

## 2021-11-29 ENCOUNTER — Ambulatory Visit: Payer: Medicare Other | Admitting: Internal Medicine

## 2021-12-01 ENCOUNTER — Ambulatory Visit: Payer: Medicare Other | Admitting: Internal Medicine

## 2021-12-06 NOTE — Progress Notes (Signed)
Erica [name] [place] [address] [name] Phone: [phone] Subjective:   [name], am serving as a scribe for Dr. Hulan Saas.  I'm seeing this patient by the request  of:  [name], MD  CC: Back pain and neck pain follow-up  Erica [name]  07/13/2021 Worsening symptoms at this time.  Patient has responded to epidurals in her back previously but unfortunately with patient having some weight loss, with increasing discomfort and pain, as well as affecting more of the daily activities and patient's past medical history is given for Crohn's as well as cancer.  Advanced imaging is warranted.  Last MRI was in 2018.  We may need to consider the possibility of what has progressed and what is changed.  Depending on findings we will discuss if the injections are beneficial.  Patient would be open to surgery but would like to avoid intercourse if possible.  Patient will follow-up after imaging we will discuss further treatment options.  Today patient will have a shot of Toradol and Depo-Medrol for pain relief which patient has responded to previously.    Saw Dr. Glennon Mac on 11/28/2021  Updated 12/09/2021 Erica [name] is a 71 y.o. female coming in with complaint of back pain. Epi on 09/26/2021. Doing well. Dr. Rowe Clack said she needs to get epidurals on a regular bases. No new issues. Patient recently did move out of her house.  With some of this patient was noticing some exacerbation of her neck and low back pain.  Starting to affect daily activities again.      Past Medical History:  Diagnosis Date   Acute duodenitis 04/24/2017   Allergy    ANEMIA    Anxiety    BACK PAIN, LUMBAR    Cancer of ascending colon pTispN0 s/p colectomy 03/05/2009 02/18/2010   Qualifier: Diagnosis of  By: Carlean Purl MD, Tonna Boehringer E    CHF (congestive heart failure) ([place])    Complete rotator cuff tear of left shoulder 11/27/2013   Ultrasound guided injection on  November 27, 2013    COPD (chronic obstructive pulmonary disease) with emphysema ([place])    COPD exacerbation ([place]) 11/14/2017   Crohn's  02/2010   ileal ulcers, intol of entercort--refuses treatment   Emphysema of lung ([place])    GERD    History of blood transfusion    w/ c/s surgery   History of transfusion of whole blood    HYPERLIPIDEMIA    HYPERTENSION    Microscopic hematuria    chronic   Myocardial infarction ([place]) 2010   Obstruction of intestine or colon ([place])    adhesions   OSTEOARTHRITIS    Rectal fissure    Possible fissure   Rotator cuff tear    Takotsubo syndrome 12/2008   URINARY INCONTINENCE    Past Surgical History:  Procedure Laterality Date   ABDOMINAL HYSTERECTOMY  1994   APPENDECTOMY  1964   BLADDER SUSPENSION     CESAREAN SECTION     x 3   CHOLECYSTECTOMY  1995   COLONOSCOPY  2017   COLONOSCOPY W/ BIOPSIES AND POLYPECTOMY  01/24/2011   (Crohn's)ileitis, internal hemorrhoids   ECTOPIC PREGNANCY SURGERY     ESOPHAGOGASTRODUODENOSCOPY     FACIAL COSMETIC SURGERY     HAND SURGERY Bilateral 1991   x 2   KNEE SURGERY Right 1981   LYSIS OF ADHESION  2010   ex lap/LOA for SBO Dr Excell Seltzer   ORBITAL FRACTURE SURGERY  1990  RIGHT COLECTOMY  03/2009   Right colectomy for colon CA.  Dr Donne Hazel   TONSILLECTOMY  1952   TOTAL KNEE ARTHROPLASTY  03/2010   Dr Alvan Dame.  Depuy   UPPER GASTROINTESTINAL ENDOSCOPY  05/16/2010   normal   UTERINE SUSPENSION     VIDEO BRONCHOSCOPY Bilateral 12/18/2014   Procedure: VIDEO BRONCHOSCOPY WITHOUT FLUORO;  Surgeon: Tanda Rockers, MD;  Location: [facility];  Service: Cardiopulmonary;  Laterality: Bilateral;   Social History   Socioeconomic History   Marital status: Widowed    Spouse name: Not on file   Number of children: 5   Years of education: 14   Highest education level: High school graduate  Occupational History   Occupation: Retired     Fish farm manager: UNEMPLOYED  Tobacco Use   Smoking status: Every Day    Packs/day:  1.00    Years: 53.00    Total pack years: 53.00    Types: Cigarettes   Smokeless tobacco: Never  Vaping Use   Vaping Use: Never used  Substance and Sexual Activity   Alcohol use: Yes    Comment: occasional mixed drink   Drug use: No   Sexual activity: Not Currently    Birth control/protection: Surgical    Comment: Hysterectomy  Other Topics Concern   Not on file  Social History Narrative   Widowed; has five children, 8 grandchildren and 4 great-grandchildren.   Social Determinants of Health   Financial Resource Strain: Low Risk  (11/14/2021)   Overall Financial Resource Strain (CARDIA)    Difficulty of Paying Living Expenses: Not hard at all  Food Insecurity: No Food Insecurity (11/14/2021)   Hunger Vital Sign    Worried About Running Out of Food in the Last Year: Never true    Ran Out of Food in the Last Year: Never true  Transportation Needs: No Transportation Needs (11/14/2021)   PRAPARE - Hydrologist (Medical): No    Lack of Transportation (Non-Medical): No  Physical Activity: Sufficiently Active (11/14/2021)   Exercise Vital Sign    Days of Exercise per Week: 5 days    Minutes of Exercise per Session: 30 min  Stress: No Stress Concern Present (11/14/2021)   [name]    Feeling of Stress : Not at all  Social Connections: Moderately Integrated (11/14/2021)   Social Connection and Isolation Panel [NHANES]    Frequency of Communication with Friends and Family: Three times a week    Frequency of Social Gatherings with Friends and Family: Three times a week    Attends Religious Services: More than 4 times per year    Active Member of Clubs or Organizations: Yes    Attends Archivist Meetings: More than 4 times per year    Marital Status: Widowed   Allergies  Allergen Reactions   Morphine Nausea And Vomiting   Family History  Problem Relation Age of Onset   Colon  cancer Father 34   Hypertension Father    Heart disease Father    Kidney disease Father    Colon cancer Sister 46   Liver cancer Sister    Other Sister        amyloidosis   Throat cancer Mother    Arthritis Other        Parent, other relative   Stomach cancer Neg Hx    Rectal cancer Neg Hx      Current Outpatient Medications (Cardiovascular):  metoprolol tartrate (LOPRESSOR) 25 MG tablet, Take 1 tablet (25 mg total) by mouth 2 (two) times daily.   rosuvastatin (CRESTOR) 10 MG tablet, TAKE ONE TABLET BY MOUTH FIVE TIMES A WEEK ON MONDAY-FRIDAY  Current Outpatient Medications (Respiratory):    albuterol (PROVENTIL) (2.5 MG/3ML) 0.083% nebulizer solution, Take 3 mLs (2.5 mg total) by nebulization every 6 (six) hours as needed for wheezing or shortness of breath.   albuterol (VENTOLIN HFA) 108 (90 Base) MCG/ACT inhaler, INHALE 2 PUFFS BY MOUTH EVERY 4 HOURS AS NEEDED FOR WHEEZING FOR SHORTNESS OF BREATH   fluticasone (FLONASE) 50 MCG/ACT nasal spray, Use 2 spray(s) in each nostril once daily  Current Outpatient Medications (Analgesics):    aspirin EC 81 MG tablet, Take 81 mg by mouth daily.   HYDROcodone-acetaminophen (NORCO/VICODIN) 5-325 MG tablet, Take 1 tablet by mouth 2 (two) times daily as needed for moderate pain (pain).   Current Outpatient Medications (Other):    cyclobenzaprine (FLEXERIL) 10 MG tablet, Take 1 tablet by mouth three times daily as needed for muscle spasm (Patient taking differently: Take 5 mg by mouth as needed.)   Reviewed prior external information including notes and imaging from  primary care provider As well as notes that were available from care everywhere and other healthcare systems.  Past medical history, social, surgical and family history all reviewed in electronic medical record.  No pertanent information unless stated regarding to the chief complaint.   Review of Systems:  No headache, visual changes, nausea, vomiting, diarrhea,  constipation, dizziness, abdominal pain, skin rash, fevers, chills, night sweats, weight loss, swollen lymph nodes, joint swelling, chest pain, shortness of breath, mood changes. POSITIVE muscle aches, body aches  Objective  Blood pressure 130/74, pulse 88, height 5' 1"  (1.549 m), weight 108 lb (49 kg), SpO2 92 %.   General: No apparent distress alert and oriented x3 mood and affect normal, dressed appropriately.  Cachectic HEENT: Pupils equal, extraocular movements intact  Respiratory: Patient's speak in full sentences and does not appear short of breath  Cardiovascular: No lower extremity edema, non tender, no erythema  Has lost some more weight since we have seen her previously.  Patient does have arthritic changes of multiple joints noted.  Limited sidebending of the neck bilaterally.  Positive radicular symptoms noted with straight leg test and more seems to be bilaterally than usual.  Patient has difficulty getting out of a chair.    Impression and Recommendations:     The above documentation has been reviewed and is accurate and complete Lyndal Pulley, DO

## 2021-12-09 ENCOUNTER — Encounter: Payer: Self-pay | Admitting: Family Medicine

## 2021-12-09 ENCOUNTER — Ambulatory Visit (INDEPENDENT_AMBULATORY_CARE_PROVIDER_SITE_OTHER): Payer: Medicare Other | Admitting: Family Medicine

## 2021-12-09 VITALS — BP 130/74 | HR 88 | Ht 61.0 in | Wt 108.0 lb

## 2021-12-09 DIAGNOSIS — M545 Low back pain, unspecified: Secondary | ICD-10-CM

## 2021-12-09 DIAGNOSIS — I2510 Atherosclerotic heart disease of native coronary artery without angina pectoris: Secondary | ICD-10-CM

## 2021-12-09 DIAGNOSIS — M5416 Radiculopathy, lumbar region: Secondary | ICD-10-CM

## 2021-12-09 MED ORDER — KETOROLAC TROMETHAMINE 30 MG/ML IJ SOLN
30.0000 mg | Freq: Once | INTRAMUSCULAR | Status: AC
  Administered 2021-12-09: 30 mg via INTRAMUSCULAR

## 2021-12-09 MED ORDER — METHYLPREDNISOLONE ACETATE 40 MG/ML IJ SUSP
40.0000 mg | Freq: Once | INTRAMUSCULAR | Status: AC
  Administered 2021-12-09: 40 mg via INTRAMUSCULAR

## 2021-12-09 NOTE — Patient Instructions (Addendum)
Epidural every 3 months if needed [facility] [phone] Injection today Congrats on selling the house See you again in 3-6 months for a check in

## 2021-12-09 NOTE — Assessment & Plan Note (Signed)
Some worsening lumbar radiculopathy.  Has responded to epidurals previously.  Last one was 10 months ago.  I do feel that patient could respond again and we could consider every 3 to 6 months needing injections.  Due to patient's other health issues do not want to do significantly increase medications if possible.  Toradol and Depo-Medrol given today as well.  Discussed with patient to continue to follow-up with her other specialist.  Patient is off her pain medications and did not notice any significant change which is a relatively good sign now.  Total time with patient 31 minutes

## 2021-12-12 DIAGNOSIS — F99 Mental disorder, not otherwise specified: Secondary | ICD-10-CM | POA: Diagnosis not present

## 2022-02-07 NOTE — Progress Notes (Unsigned)
[place] Phone: [phone] Subjective:   [name], am serving as a scribe for Dr. Hulan Saas.  I'm seeing this patient by the request  of:  [name], MD  CC: Neck pain follow-up  [id number]  12/09/2021 Some worsening lumbar radiculopathy.  Has responded to epidurals previously.  Last one was 10 months ago.  I do feel that patient could respond again and we could consider every 3 to 6 months needing injections.  Due to patient's other health issues do not want to do significantly increase medications if possible.  Toradol and Depo-Medrol given today as well.  Discussed with patient to continue to follow-up with her other specialist.  Patient is off her pain medications and did not notice any significant change which is a relatively good sign now.  Total time with patient 31 minutes Some worsening lumbar radiculopathy.  Has responded to epidurals previously.  Last one was 10 months ago.  I do feel that patient could respond again and we could consider every 3 to 6 months needing injections.  Due to patient's other health issues do not want to do significantly increase medications if possible.  Toradol and Depo-Medrol given today as well.  Discussed with patient to continue to follow-up with her other specialist.  Patient is off her pain medications and did not notice any significant change which is a relatively good sign now.  Total time with patient 31 minutes   Update 02/09/2022 Erica [name] is a 71 y.o. female coming in with complaint of lumbar spine pain. Has not had another epidural since last visit. Patient states that her back pain is worse today due to moving into apartment.  Patient is moved away at the moment and had do a lot of different moving.  Patient would like to discuss new primary care provider [facility].        Past Medical History:  Diagnosis Date   Acute  duodenitis 04/24/2017   Allergy    ANEMIA    Anxiety    BACK PAIN, LUMBAR    Cancer of ascending colon pTispN0 s/p colectomy 03/05/2009 02/18/2010   Qualifier: Diagnosis of  By: Carlean Purl MD, Tonna Boehringer E    CHF (congestive heart failure) ([place])    Complete rotator cuff tear of left shoulder 11/27/2013   Ultrasound guided injection on November 27, 2013    COPD (chronic obstructive pulmonary disease) with emphysema ([place])    COPD exacerbation ([place]) 11/14/2017   Crohn's  02/2010   ileal ulcers, intol of entercort--refuses treatment   Emphysema of lung ([place])    GERD    History of blood transfusion    w/ c/s surgery   History of transfusion of whole blood    HYPERLIPIDEMIA    HYPERTENSION    Microscopic hematuria    chronic   Myocardial infarction ([place]) 2010   Obstruction of intestine or colon ([place])    adhesions   OSTEOARTHRITIS    Rectal fissure    Possible fissure   Rotator cuff tear    Takotsubo syndrome 12/2008   URINARY INCONTINENCE    Past Surgical History:  Procedure Laterality Date   ABDOMINAL HYSTERECTOMY  1994   APPENDECTOMY  1964   BLADDER SUSPENSION     CESAREAN SECTION     x 3   CHOLECYSTECTOMY  1995   COLONOSCOPY  2017   COLONOSCOPY W/ BIOPSIES AND POLYPECTOMY  01/24/2011   (Crohn's)ileitis,  internal hemorrhoids   ECTOPIC PREGNANCY SURGERY     ESOPHAGOGASTRODUODENOSCOPY     FACIAL COSMETIC SURGERY     HAND SURGERY Bilateral 1991   x 2   KNEE SURGERY Right 1981   LYSIS OF ADHESION  2010   ex lap/LOA for SBO Dr Excell Seltzer   ORBITAL FRACTURE SURGERY  1990   RIGHT COLECTOMY  03/2009   Right colectomy for colon CA.  Dr Donne Hazel   TONSILLECTOMY  1952   TOTAL KNEE ARTHROPLASTY  03/2010   Dr Alvan Dame.  Depuy   UPPER GASTROINTESTINAL ENDOSCOPY  05/16/2010   normal   UTERINE SUSPENSION     VIDEO BRONCHOSCOPY Bilateral 12/18/2014   Procedure: VIDEO BRONCHOSCOPY WITHOUT FLUORO;  Surgeon: Tanda Rockers, MD;  Location: [facility];  Service: Cardiopulmonary;  Laterality:  Bilateral;   Social History   Socioeconomic History   Marital status: Widowed    Spouse name: Not on file   Number of children: 5   Years of education: 14   Highest education level: High school graduate  Occupational History   Occupation: Retired     Fish farm manager: UNEMPLOYED  Tobacco Use   Smoking status: Every Day    Packs/day: 1.00    Years: 53.00    Total pack years: 53.00    Types: Cigarettes   Smokeless tobacco: Never  Vaping Use   Vaping Use: Never used  Substance and Sexual Activity   Alcohol use: Yes    Comment: occasional mixed drink   Drug use: No   Sexual activity: Not Currently    Birth control/protection: Surgical    Comment: Hysterectomy  Other Topics Concern   Not on file  Social History Narrative   Widowed; has five children, 8 grandchildren and 4 great-grandchildren.   Social Determinants of Health   Financial Resource Strain: Low Risk  (11/14/2021)   Overall Financial Resource Strain (CARDIA)    Difficulty of Paying Living Expenses: Not hard at all  Food Insecurity: No Food Insecurity (11/14/2021)   Hunger Vital Sign    Worried About Running Out of Food in the Last Year: Never true    Ran Out of Food in the Last Year: Never true  Transportation Needs: No Transportation Needs (11/14/2021)   PRAPARE - Hydrologist (Medical): No    Lack of Transportation (Non-Medical): No  Physical Activity: Sufficiently Active (11/14/2021)   Exercise Vital Sign    Days of Exercise per Week: 5 days    Minutes of Exercise per Session: 30 min  Stress: No Stress Concern Present (11/14/2021)   [place]    Feeling of Stress : Not at all  Social Connections: Moderately Integrated (11/14/2021)   Social Connection and Isolation Panel [NHANES]    Frequency of Communication with Friends and Family: Three times a week    Frequency of Social Gatherings with Friends and Family: Three times  a week    Attends Religious Services: More than 4 times per year    Active Member of Clubs or Organizations: Yes    Attends Archivist Meetings: More than 4 times per year    Marital Status: Widowed   Allergies  Allergen Reactions   Morphine Nausea And Vomiting   Family History  Problem Relation Age of Onset   Colon cancer Father 8   Hypertension Father    Heart disease Father    Kidney disease Father    Colon cancer Sister 62  Liver cancer Sister    Other Sister        amyloidosis   Throat cancer Mother    Arthritis Other        Parent, other relative   Stomach cancer Neg Hx    Rectal cancer Neg Hx      Current Outpatient Medications (Cardiovascular):    metoprolol tartrate (LOPRESSOR) 25 MG tablet, Take 1 tablet (25 mg total) by mouth 2 (two) times daily.   rosuvastatin (CRESTOR) 10 MG tablet, TAKE ONE TABLET BY MOUTH FIVE TIMES A WEEK ON MONDAY-FRIDAY  Current Outpatient Medications (Respiratory):    albuterol (PROVENTIL) (2.5 MG/3ML) 0.083% nebulizer solution, Take 3 mLs (2.5 mg total) by nebulization every 6 (six) hours as needed for wheezing or shortness of breath.   albuterol (VENTOLIN HFA) 108 (90 Base) MCG/ACT inhaler, INHALE 2 PUFFS BY MOUTH EVERY 4 HOURS AS NEEDED FOR WHEEZING FOR SHORTNESS OF BREATH   fluticasone (FLONASE) 50 MCG/ACT nasal spray, Use 2 spray(s) in each nostril once daily  Current Outpatient Medications (Analgesics):    aspirin EC 81 MG tablet, Take 81 mg by mouth daily.   HYDROcodone-acetaminophen (NORCO/VICODIN) 5-325 MG tablet, Take 1 tablet by mouth 2 (two) times daily as needed for moderate pain (pain).   Current Outpatient Medications (Other):    cyclobenzaprine (FLEXERIL) 10 MG tablet, Take 1 tablet by mouth three times daily as needed for muscle spasm (Patient taking differently: Take 5 mg by mouth as needed.)   Reviewed prior external information including notes and imaging from  primary care provider As well as notes  that were available from care everywhere and other healthcare systems.  Past medical history, social, surgical and family history all reviewed in electronic medical record.  No pertanent information unless stated regarding to the chief complaint.   Review of Systems:  No headache, visual changes, nausea, vomiting, diarrhea, constipation, dizziness, abdominal pain, skin rash, fevers, chills, night sweats, weight loss, swollen lymph nodes, , joint swelling, chest pain, shortness of breath, mood changes. POSITIVE muscle aches, body aches  Objective  Blood pressure 136/82, pulse 81, height 5' 1"  (1.549 m), weight 108 lb (49 kg), SpO2 94 %.   General: No apparent distress alert and oriented x3 mood and affect normal, dressed appropriately.  HEENT: Pupils equal, extraocular movements intact  Respiratory: Patient's speak in full sentences and does not appear short of breath  Cardiovascular: No lower extremity edema, non tender, no erythema  Patient does appear to be somewhat cachectic.  Patient does have limited range of motion of the neck.  Patient has only 5 degrees of even extension.  Does cause some radicular symptoms down the left hand.  Weakness noted with the left hand especially in the C7 and C8 distributions at the moment.    Impression and Recommendations:    The above documentation has been reviewed and is accurate and complete Lyndal Pulley, DO

## 2022-02-09 ENCOUNTER — Ambulatory Visit (INDEPENDENT_AMBULATORY_CARE_PROVIDER_SITE_OTHER): Payer: Medicare Other | Admitting: Family Medicine

## 2022-02-09 ENCOUNTER — Encounter: Payer: Self-pay | Admitting: Family Medicine

## 2022-02-09 VITALS — BP 136/82 | HR 81 | Ht 61.0 in | Wt 108.0 lb

## 2022-02-09 DIAGNOSIS — M5010 Cervical disc disorder with radiculopathy, unspecified cervical region: Secondary | ICD-10-CM | POA: Diagnosis not present

## 2022-02-09 DIAGNOSIS — I2510 Atherosclerotic heart disease of native coronary artery without angina pectoris: Secondary | ICD-10-CM

## 2022-02-09 DIAGNOSIS — M5416 Radiculopathy, lumbar region: Secondary | ICD-10-CM

## 2022-02-09 MED ORDER — METHYLPREDNISOLONE ACETATE 80 MG/ML IJ SUSP
80.0000 mg | Freq: Once | INTRAMUSCULAR | Status: AC
  Administered 2022-02-09: 80 mg via INTRAMUSCULAR

## 2022-02-09 MED ORDER — KETOROLAC TROMETHAMINE 60 MG/2ML IM SOLN
60.0000 mg | Freq: Once | INTRAMUSCULAR | Status: AC
  Administered 2022-02-09: 60 mg via INTRAMUSCULAR

## 2022-02-09 NOTE — Patient Instructions (Addendum)
Wilfred Lacy at [employer] See me in 2 months

## 2022-02-09 NOTE — Assessment & Plan Note (Addendum)
Continues to have chronic pain overall.  Patient continues to get pain medication from other providers.  Patient has multiple different medical problems including significant arthritic changes, Crohn's disease, COPD as well.  We discussed with patient that this is contributing to social determinants of health.  Recently did move into a new apartment and has been doing more lifting.  Likely contributing to some of the increase in the discomfort.  Toradol and Depo-Medrol given 1 time that I do think will be beneficial.  Patient does seem to respond to this.  We discussed with patient that due to her other medical ailments this is difficult to treat completely and can follow-up with me again in 2 months.  Total time with patient today not including the procedures 34 minutes

## 2022-02-27 ENCOUNTER — Telehealth: Payer: Self-pay | Admitting: Internal Medicine

## 2022-02-27 NOTE — Telephone Encounter (Signed)
pt advised TOC approved by Ander Purpura and Dr. [name], pt has moved closer to [facility] pt that [facility] scheduled for 03/23/2022 and would notify Dr. Sharlet Salina in the event she has not been made aware.

## 2022-02-28 NOTE — Telephone Encounter (Signed)
Fine with me

## 2022-02-28 NOTE — Telephone Encounter (Signed)
Erica [name] has advised she did not have conversation with Dr. [name] or yourself for TOC.   Pt is requesting TOC to [facility] as she has moved to an apartment nearby the office. She stated she had to downsize due to her health.  Please advise.

## 2022-03-08 DIAGNOSIS — F99 Mental disorder, not otherwise specified: Secondary | ICD-10-CM | POA: Diagnosis not present

## 2022-03-15 DIAGNOSIS — F99 Mental disorder, not otherwise specified: Secondary | ICD-10-CM | POA: Diagnosis not present

## 2022-03-18 ENCOUNTER — Other Ambulatory Visit: Payer: Self-pay | Admitting: Internal Medicine

## 2022-03-23 ENCOUNTER — Ambulatory Visit (INDEPENDENT_AMBULATORY_CARE_PROVIDER_SITE_OTHER): Payer: Medicare Other | Admitting: Nurse Practitioner

## 2022-03-23 ENCOUNTER — Encounter: Payer: Self-pay | Admitting: Nurse Practitioner

## 2022-03-23 VITALS — BP 110/76 | HR 76 | Temp 97.8°F | Ht 61.0 in | Wt 112.2 lb

## 2022-03-23 DIAGNOSIS — J439 Emphysema, unspecified: Secondary | ICD-10-CM

## 2022-03-23 DIAGNOSIS — Z23 Encounter for immunization: Secondary | ICD-10-CM

## 2022-03-23 DIAGNOSIS — K5000 Crohn's disease of small intestine without complications: Secondary | ICD-10-CM

## 2022-03-23 DIAGNOSIS — M150 Primary generalized (osteo)arthritis: Secondary | ICD-10-CM

## 2022-03-23 DIAGNOSIS — E782 Mixed hyperlipidemia: Secondary | ICD-10-CM | POA: Diagnosis not present

## 2022-03-23 DIAGNOSIS — I5181 Takotsubo syndrome: Secondary | ICD-10-CM

## 2022-03-23 DIAGNOSIS — R7303 Prediabetes: Secondary | ICD-10-CM | POA: Diagnosis not present

## 2022-03-23 DIAGNOSIS — I10 Essential (primary) hypertension: Secondary | ICD-10-CM

## 2022-03-23 DIAGNOSIS — M5010 Cervical disc disorder with radiculopathy, unspecified cervical region: Secondary | ICD-10-CM | POA: Diagnosis not present

## 2022-03-23 DIAGNOSIS — M159 Polyosteoarthritis, unspecified: Secondary | ICD-10-CM

## 2022-03-23 MED ORDER — ALBUTEROL SULFATE (2.5 MG/3ML) 0.083% IN NEBU: 2.5000 mg | INHALATION_SOLUTION | Freq: Four times a day (QID) | RESPIRATORY_TRACT | 1 refills | Status: DC | PRN

## 2022-03-23 MED ORDER — FLUTICASONE PROPIONATE 50 MCG/ACT NA SUSP: 2.0000 | Freq: Every day | NASAL | 3 refills | Status: DC

## 2022-03-23 MED ORDER — ALBUTEROL SULFATE HFA 108 (90 BASE) MCG/ACT IN AERS: 2.0000 | INHALATION_SPRAY | Freq: Four times a day (QID) | RESPIRATORY_TRACT | 0 refills | Status: DC | PRN

## 2022-03-23 NOTE — Assessment & Plan Note (Signed)
Chronic, stable.  She states that she is following with GI, however unable to see any notes.  She is not currently taking any medication for this.  Continue collaboration recommendations from GI.

## 2022-03-23 NOTE — Assessment & Plan Note (Signed)
Chronic, stable.  Continue rosuvastatin Monday Wednesday Friday.  Will check labs next visit.

## 2022-03-23 NOTE — Assessment & Plan Note (Signed)
She is currently following with Dr. Daneen Schick of cardiology.  She can continue metoprolol 25 mg twice a day, aspirin 81 mg daily, and rosuvastatin 10 mg on Monday Wednesday Friday.  Will check labs next visit.

## 2022-03-23 NOTE — Assessment & Plan Note (Signed)
She has a history of prediabetes, last A1c was 5.9%.  Will check labs next visit.

## 2022-03-23 NOTE — Progress Notes (Signed)
New Patient Visit  BP 110/76 (BP Location: Right Arm, Patient Position: Sitting, Cuff Size: Large)   Pulse 76   Temp 97.8 F (36.6 C) (Temporal)   Ht 5' 1"  (1.549 m)   Wt 112 lb 3.2 oz (50.9 kg)   LMP  (LMP Unknown)   SpO2 98%   BMI 21.20 kg/m    Subjective:    Patient ID: Erica [name], female    DOB: Jul 07, 1950, 71 y.o.   MRN: [id number]  CC: Chief Complaint  Patient presents with   Establish Care    Rx refill on albuterol Sulfate inhalation aerosol and flonase nasal spray    HPI: Erica [name] is a 71 y.o. female presents to transfer care to a new provider.  Introduced to Designer, jewellery role and practice setting.  All questions answered.  Discussed provider/patient relationship and expectations.  Arvetta recently moved and is looking to establish care with a provider closer to her house.   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  She is currently following with Dr. [name] with sports medicine.  She was taking hydrocodone, however states that her prior PCP would not refill it and feels that it was not helping anyway.  She is currently taking cyclobenzaprine 10 mg as needed.  She has a history of COPD and is currently using an albuterol inhaler or nebulizer as needed for shortness of breath and wheezing.  She is still smoking about a pack per day.  She had tried Pulmicort, however it gave her diarrhea and she was unable to tolerate it.  She also has been unable to afford other maintenance inhalers.  She states that she is following with Dr. Melvyn Novas with pulmonology.  She has had CT scans in the past for lung cancer screening.  She has a history of hyperlipidemia and is taking rosuvastatin 10 mg on Monday Wednesdays and Fridays.  She is following with cardiology and is also taking metoprolol 25 mg twice a day and aspirin 81 mg daily.  She has a history of MI and states that this was due to to stress and Takotsubo cardiomyopathy.   She denies chest pain and shortness of breath currently.  She has a history of Crohn's disease and is not taking any medication for this.  She states that she does not follow the diet suggested by her GI and so sometimes her symptoms will flareup.  She states that she follows regularly with Dr. Carlean Purl.  Depression Screen done today:      03/23/2022    9:19 PM 11/14/2021    9:21 AM 11/14/2021    9:17 AM 07/26/2021   10:17 AM 05/30/2021   10:10 AM  Depression screen PHQ 2/9  Decreased Interest 0 0 0 0 0  Down, Depressed, Hopeless 0 0 0 0 0  PHQ - 2 Score 0 0 0 0 0  Altered sleeping    0   Tired, decreased energy    0   Change in appetite    0   Feeling bad or failure about yourself     0   Trouble concentrating    0   Moving slowly or fidgety/restless    0   Suicidal thoughts    0   PHQ-9 Score    0   Difficult doing work/chores    Not difficult at all       Past Medical History:  Diagnosis Date   Acute duodenitis 04/24/2017  Allergy    ANEMIA    Anxiety    BACK PAIN, LUMBAR    Cancer of ascending colon pTispN0 s/p colectomy 03/05/2009 02/18/2010   Qualifier: Diagnosis of  By: Carlean Purl MD, Tonna Boehringer E    CHF (congestive heart failure) ([place])    Complete rotator cuff tear of left shoulder 11/27/2013   Ultrasound guided injection on November 27, 2013    COPD (chronic obstructive pulmonary disease) with emphysema ([place])    COPD exacerbation ([place]) 11/14/2017   Crohn's  02/2010   ileal ulcers, intol of entercort--refuses treatment   Emphysema of lung ([place])    GERD    History of blood transfusion    w/ c/s surgery   History of transfusion of whole blood    HYPERLIPIDEMIA    HYPERTENSION    Microscopic hematuria    chronic   Myocardial infarction ([place]) 2010   Obstruction of intestine or colon ([place])    adhesions   OSTEOARTHRITIS    Rectal fissure    Possible fissure   Rotator cuff tear    Takotsubo syndrome 12/2008   URINARY INCONTINENCE     Past Surgical History:   Procedure Laterality Date   ABDOMINAL HYSTERECTOMY  1994   APPENDECTOMY  1964   BLADDER SUSPENSION     CESAREAN SECTION     x 3   CHOLECYSTECTOMY  1995   COLONOSCOPY  2017   COLONOSCOPY W/ BIOPSIES AND POLYPECTOMY  01/24/2011   (Crohn's)ileitis, internal hemorrhoids   ECTOPIC PREGNANCY SURGERY     ESOPHAGOGASTRODUODENOSCOPY     FACIAL COSMETIC SURGERY     HAND SURGERY Bilateral 1991   x 2   KNEE SURGERY Right 1981   LYSIS OF ADHESION  2010   ex lap/LOA for SBO Dr Excell Seltzer   ORBITAL FRACTURE SURGERY  1990   RIGHT COLECTOMY  03/2009   Right colectomy for colon CA.  Dr Donne Hazel   TONSILLECTOMY  1952   TOTAL KNEE ARTHROPLASTY  03/2010   Dr Alvan Dame.  Depuy   UPPER GASTROINTESTINAL ENDOSCOPY  05/16/2010   normal   UTERINE SUSPENSION     VIDEO BRONCHOSCOPY Bilateral 12/18/2014   Procedure: VIDEO BRONCHOSCOPY WITHOUT FLUORO;  Surgeon: Tanda Rockers, MD;  Location: [facility];  Service: Cardiopulmonary;  Laterality: Bilateral;    Family History  Problem Relation Age of Onset   Colon cancer Father 67   Hypertension Father    Heart disease Father    Kidney disease Father    Colon cancer Sister 65   Liver cancer Sister    Other Sister        amyloidosis   Throat cancer Mother    Arthritis Other        Parent, other relative   Stomach cancer Neg Hx    Rectal cancer Neg Hx      Social History   Tobacco Use   Smoking status: Every Day    Packs/day: 1.00    Years: 53.00    Total pack years: 53.00    Types: Cigarettes    Passive exposure: Never   Smokeless tobacco: Never  Vaping Use   Vaping Use: Never used  Substance Use Topics   Alcohol use: Yes    Comment: occasional mixed drink   Drug use: No    Current Outpatient Medications on File Prior to Visit  Medication Sig Dispense Refill   aspirin EC 81 MG tablet Take 81 mg by mouth daily.     cyclobenzaprine (FLEXERIL) 10 MG tablet  Take 1 tablet by mouth three times daily as needed for muscle spasm (Patient taking  differently: Take 5 mg by mouth as needed.) 90 tablet 0   metoprolol tartrate (LOPRESSOR) 25 MG tablet Take 1 tablet (25 mg total) by mouth 2 (two) times daily. 90 tablet 3   rosuvastatin (CRESTOR) 10 MG tablet TAKE ONE TABLET BY MOUTH FIVE TIMES A WEEK ON MONDAY-FRIDAY (Patient taking differently: Patient reports that she takes medication on M,W,F) 61 tablet 0   No current facility-administered medications on file prior to visit.     Review of Systems  Constitutional:  Positive for fatigue. Negative for fever.  HENT: Negative.    Respiratory: Negative.    Cardiovascular: Negative.   Gastrointestinal:  Positive for diarrhea. Negative for abdominal pain and nausea.  Genitourinary: Negative.   Musculoskeletal:  Positive for arthralgias and back pain.  Skin: Negative.   Neurological: Negative.   Psychiatric/Behavioral: Negative.        Objective:    BP 110/76 (BP Location: Right Arm, Patient Position: Sitting, Cuff Size: Large)   Pulse 76   Temp 97.8 F (36.6 C) (Temporal)   Ht 5' 1"  (1.549 m)   Wt 112 lb 3.2 oz (50.9 kg)   LMP  (LMP Unknown)   SpO2 98%   BMI 21.20 kg/m   Wt Readings from Last 3 Encounters:  03/23/22 112 lb 3.2 oz (50.9 kg)  02/09/22 108 lb (49 kg)  12/09/21 108 lb (49 kg)    BP Readings from Last 3 Encounters:  03/23/22 110/76  02/09/22 136/82  12/09/21 130/74    Physical Exam Vitals and nursing note reviewed.  Constitutional:      General: She is not in acute distress.    Appearance: Normal appearance.  HENT:     Head: Normocephalic.  Eyes:     Conjunctiva/sclera: Conjunctivae normal.  Cardiovascular:     Rate and Rhythm: Normal rate and regular rhythm.     Pulses: Normal pulses.     Heart sounds: Normal heart sounds.  Pulmonary:     Effort: Pulmonary effort is normal.     Breath sounds: Normal breath sounds.  Abdominal:     Palpations: Abdomen is soft.     Tenderness: There is no abdominal tenderness.  Musculoskeletal:     Cervical back:  Normal range of motion.  Skin:    General: Skin is warm.  Neurological:     General: No focal deficit present.     Mental Status: She is alert and oriented to person, place, and time.  Psychiatric:        Mood and Affect: Mood normal.        Behavior: Behavior normal.        Thought Content: Thought content normal.        Judgment: Judgment normal.       Assessment & Plan:   Problem List Items Addressed This Visit       Cardiovascular and Mediastinum   Essential hypertension - Primary    Chronic, stable.  BP today 110/76.  Continue metoprolol 25 mg twice daily.  Will check labs next visit.      Takotsubo syndrome    She is currently following with Dr. Daneen Schick of cardiology.  She can continue metoprolol 25 mg twice a day, aspirin 81 mg daily, and rosuvastatin 10 mg on Monday Wednesday Friday.  Will check labs next visit.        Respiratory   COPD (chronic obstructive pulmonary disease) (  HCC)    Chronic, stable.  She is currently using albuterol inhaler or nebulizer as needed for shortness of breath or wheezing.  She states that her symptoms are overall well-controlled.  She states that she was following with Dr. Melvyn Novas, however do not see any recent notes from him.  She has tried Pulmicort in the past, however it caused diarrhea and she was unable to tolerate it.  Will have her continue current regimen, refill sent to the pharmacy.      Relevant Medications   albuterol (PROVENTIL) (2.5 MG/3ML) 0.083% nebulizer solution   albuterol (VENTOLIN HFA) 108 (90 Base) MCG/ACT inhaler   fluticasone (FLONASE) 50 MCG/ACT nasal spray     Nervous and Auditory   Cervical disc disorder with radiculopathy of cervical region    Chronic, stable.  She is currently following with Dr. Hulan Saas with sports medicine.  She is taking cyclobenzaprine 5 mg as needed for symptoms.  She was taking hydrocodone, however her prior doctor did not refill this due to not coming in for appointments or  urine drug screen.  She states that her pain is the same with taking the medication or not, will not continue the hydrocodone at this point.  Continue collaboration recommendations from Dr. [name].         Musculoskeletal and Integument   Osteoarthritis    Chronic, stable.  She states that she is following with Dr. [name] with sports medicine and is taking cyclobenzaprine 5 mg as needed for pain.  She was taking hydrocodone, however she stopped this as noted above and states that she has not noticed a difference in her pain.  Will hold off on hydrocodone at this point.  Follow-up as symptoms worsen or with any concerns.        Other   Hyperlipidemia    Chronic, stable.  Continue rosuvastatin Monday Wednesday Friday.  Will check labs next visit.      Crohn disease (HCC)    Chronic, stable.  She states that she is following with GI, however unable to see any notes.  She is not currently taking any medication for this.  Continue collaboration recommendations from GI.      Pre-diabetes    She has a history of prediabetes, last A1c was 5.9%.  Will check labs next visit.      Other Visit Diagnoses     Need for influenza vaccination       Flu vaccine given today.   Relevant Orders   Flu Vaccine QUAD High Dose(Fluad) (Completed)        Follow up plan: Return in about 3 months (around 06/22/2022) for HLD, prediabetes, fasting.

## 2022-03-23 NOTE — Patient Instructions (Signed)
It was great to see you!  I have placed an order for screening mammogram, they should call you to schedule. If you do not hear from them in the next week, please call:  [facility] [place], [place], [place]  Let's follow-up in 3 months, sooner if you have concerns.  If a referral was placed today, you will be contacted for an appointment. Please note that routine referrals can sometimes take up to 3-4 weeks to process. Please call our office if you haven't heard anything after this time frame.  Take care,  Vance Peper, NP

## 2022-03-23 NOTE — Assessment & Plan Note (Signed)
Chronic, stable.  She states that she is following with Dr. [name] with sports medicine and is taking cyclobenzaprine 5 mg as needed for pain.  She was taking hydrocodone, however she stopped this as noted above and states that she has not noticed a difference in her pain.  Will hold off on hydrocodone at this point.  Follow-up as symptoms worsen or with any concerns.

## 2022-03-23 NOTE — Assessment & Plan Note (Signed)
Chronic, stable.  She is currently using albuterol inhaler or nebulizer as needed for shortness of breath or wheezing.  She states that her symptoms are overall well-controlled.  She states that she was following with Dr. Melvyn Novas, however do not see any recent notes from him.  She has tried Pulmicort in the past, however it caused diarrhea and she was unable to tolerate it.  Will have her continue current regimen, refill sent to the pharmacy.

## 2022-03-23 NOTE — Assessment & Plan Note (Signed)
Chronic, stable.  BP today 110/76.  Continue metoprolol 25 mg twice daily.  Will check labs next visit.

## 2022-03-23 NOTE — Assessment & Plan Note (Signed)
Chronic, stable.  She is currently following with Dr. Hulan Saas with sports medicine.  She is taking cyclobenzaprine 5 mg as needed for symptoms.  She was taking hydrocodone, however her prior doctor did not refill this due to not coming in for appointments or urine drug screen.  She states that her pain is the same with taking the medication or not, will not continue the hydrocodone at this point.  Continue collaboration recommendations from Dr. [name].

## 2022-03-31 DIAGNOSIS — F99 Mental disorder, not otherwise specified: Secondary | ICD-10-CM | POA: Diagnosis not present

## 2022-04-05 DIAGNOSIS — F99 Mental disorder, not otherwise specified: Secondary | ICD-10-CM | POA: Diagnosis not present

## 2022-04-12 DIAGNOSIS — F99 Mental disorder, not otherwise specified: Secondary | ICD-10-CM | POA: Diagnosis not present

## 2022-04-12 NOTE — Progress Notes (Signed)
[name] [place] Phone: [phone] Subjective:   Erica [name], am serving as a scribe for Dr. Hulan Saas.  I'm seeing this patient by the request  of:  McElwee, [name], NP  CC: neck pain follow up   [id number]  02/09/2022 Continues to have chronic pain overall.  Patient continues to get pain medication from other providers.  Patient has multiple different medical problems including significant arthritic changes, Crohn's disease, COPD as well.  We discussed with patient that this is contributing to social determinants of health.  Recently did move into a new apartment and has been doing more lifting.  Likely contributing to some of the increase in the discomfort.  Toradol and Depo-Medrol given 1 time that I do think will be beneficial.  Patient does seem to respond to this.  We discussed with patient that due to her other medical ailments this is difficult to treat completely and can follow-up with me again in 2 months.  Total time with patient today not including the procedures 34 minutes    Update 04/18/2022 Erica [name] is a 72 y.o. female coming in with complaint of cervical radiculopathy. Patient states that she is ready for her injections today. Patient brought the new placard for [place] to sign. Patient states she saw her new PCP and she has been great.     Past Medical History:  Diagnosis Date   Acute duodenitis 04/24/2017   Allergy    ANEMIA    Anxiety    BACK PAIN, LUMBAR    Cancer of ascending colon pTispN0 s/p colectomy 03/05/2009 02/18/2010   Qualifier: Diagnosis of  By: Carlean Purl MD, Tonna Boehringer E    CHF (congestive heart failure) ([place])    Complete rotator cuff tear of left shoulder 11/27/2013   Ultrasound guided injection on November 27, 2013    COPD (chronic obstructive pulmonary disease) with emphysema ([place])    COPD exacerbation ([place]) 11/14/2017   Crohn's  02/2010   ileal ulcers, intol of  entercort--refuses treatment   Emphysema of lung ([place])    GERD    History of blood transfusion    w/ c/s surgery   History of transfusion of whole blood    HYPERLIPIDEMIA    HYPERTENSION    Microscopic hematuria    chronic   Myocardial infarction ([place]) 2010   Obstruction of intestine or colon ([place])    adhesions   OSTEOARTHRITIS    Rectal fissure    Possible fissure   Rotator cuff tear    Takotsubo syndrome 12/2008   URINARY INCONTINENCE    Past Surgical History:  Procedure Laterality Date   ABDOMINAL HYSTERECTOMY  1994   APPENDECTOMY  1964   BLADDER SUSPENSION     CESAREAN SECTION     x 3   CHOLECYSTECTOMY  1995   COLONOSCOPY  2017   COLONOSCOPY W/ BIOPSIES AND POLYPECTOMY  01/24/2011   (Crohn's)ileitis, internal hemorrhoids   ECTOPIC PREGNANCY SURGERY     ESOPHAGOGASTRODUODENOSCOPY     FACIAL COSMETIC SURGERY     HAND SURGERY Bilateral 1991   x 2   KNEE SURGERY Right 1981   LYSIS OF ADHESION  2010   ex lap/LOA for SBO Dr Excell Seltzer   ORBITAL FRACTURE SURGERY  1990   RIGHT COLECTOMY  03/2009   Right colectomy for colon CA.  Dr Donne Hazel   TONSILLECTOMY  1952   TOTAL KNEE ARTHROPLASTY  03/2010   Dr Alvan Dame.  Depuy  UPPER GASTROINTESTINAL ENDOSCOPY  05/16/2010   normal   UTERINE SUSPENSION     VIDEO BRONCHOSCOPY Bilateral 12/18/2014   Procedure: VIDEO BRONCHOSCOPY WITHOUT FLUORO;  Surgeon: Tanda Rockers, MD;  Location: [facility];  Service: Cardiopulmonary;  Laterality: Bilateral;   Social History   Socioeconomic History   Marital status: Widowed    Spouse name: Not on file   Number of children: 5   Years of education: 14   Highest education level: High school graduate  Occupational History   Occupation: Retired     Fish farm manager: UNEMPLOYED  Tobacco Use   Smoking status: Every Day    Packs/day: 1.00    Years: 53.00    Total pack years: 53.00    Types: Cigarettes    Passive exposure: Never   Smokeless tobacco: Never  Vaping Use   Vaping Use: Never used   Substance and Sexual Activity   Alcohol use: Yes    Comment: occasional mixed drink   Drug use: No   Sexual activity: Not Currently    Birth control/protection: Surgical    Comment: Hysterectomy  Other Topics Concern   Not on file  Social History Narrative   Not on file   Social Determinants of Health   Financial Resource Strain: Low Risk  (11/14/2021)   Overall Financial Resource Strain (CARDIA)    Difficulty of Paying Living Expenses: Not hard at all  Food Insecurity: No Food Insecurity (11/14/2021)   Hunger Vital Sign    Worried About Running Out of Food in the Last Year: Never true    Ran Out of Food in the Last Year: Never true  Transportation Needs: No Transportation Needs (11/14/2021)   PRAPARE - Hydrologist (Medical): No    Lack of Transportation (Non-Medical): No  Physical Activity: Sufficiently Active (11/14/2021)   Exercise Vital Sign    Days of Exercise per Week: 5 days    Minutes of Exercise per Session: 30 min  Stress: No Stress Concern Present (11/14/2021)   [place]    Feeling of Stress : Not at all  Social Connections: Moderately Integrated (11/14/2021)   Social Connection and Isolation Panel [NHANES]    Frequency of Communication with Friends and Family: Three times a week    Frequency of Social Gatherings with Friends and Family: Three times a week    Attends Religious Services: More than 4 times per year    Active Member of Clubs or Organizations: Yes    Attends Archivist Meetings: More than 4 times per year    Marital Status: Widowed   Allergies  Allergen Reactions   Morphine Nausea And Vomiting   Family History  Problem Relation Age of Onset   Colon cancer Father 41   Hypertension Father    Heart disease Father    Kidney disease Father    Colon cancer Sister 36   Liver cancer Sister    Other Sister        amyloidosis   Throat cancer  Mother    Arthritis Other        Parent, other relative   Stomach cancer Neg Hx    Rectal cancer Neg Hx      Current Outpatient Medications (Cardiovascular):    metoprolol tartrate (LOPRESSOR) 25 MG tablet, Take 1 tablet (25 mg total) by mouth 2 (two) times daily.   rosuvastatin (CRESTOR) 10 MG tablet, TAKE ONE TABLET BY MOUTH FIVE TIMES A  WEEK ON MONDAY-FRIDAY (Patient taking differently: Patient reports that she takes medication on M,W,F)  Current Outpatient Medications (Respiratory):    albuterol (PROVENTIL) (2.5 MG/3ML) 0.083% nebulizer solution, Take 3 mLs (2.5 mg total) by nebulization every 6 (six) hours as needed for wheezing or shortness of breath.   albuterol (VENTOLIN HFA) 108 (90 Base) MCG/ACT inhaler, Inhale 2 puffs into the lungs every 6 (six) hours as needed for wheezing or shortness of breath.   fluticasone (FLONASE) 50 MCG/ACT nasal spray, Place 2 sprays into both nostrils daily.  Current Outpatient Medications (Analgesics):    aspirin EC 81 MG tablet, Take 81 mg by mouth daily.   Current Outpatient Medications (Other):    cyclobenzaprine (FLEXERIL) 10 MG tablet, Take 1 tablet by mouth three times daily as needed for muscle spasm (Patient taking differently: Take 5 mg by mouth as needed.)   Reviewed prior external information including notes and imaging from  primary care provider As well as notes that were available from care everywhere and other healthcare systems.  Past medical history, social, surgical and family history all reviewed in electronic medical record.  No pertanent information unless stated regarding to the chief complaint.   Review of Systems:  No headache, visual changes, nausea, vomiting, diarrhea, constipation, dizziness, abdominal pain, skin rash, fevers, chills, night sweats, weight loss, swollen lymph nodes,  chest pain, shortness of breath, mood changes. POSITIVE muscle aches, joint swelling, body aches  Objective  Blood pressure 102/70,  pulse 70, height '5\' 1"'$  (1.549 m), weight 110 lb (49.9 kg), SpO2 97 %.   General: No apparent distress alert and oriented x3 mood and affect normal, dressed appropriately.  HEENT: Pupils equal, extraocular movements intact  Respiratory: Patient's speak in full sentences and does not appear short of breath but does have a cough noted. Cardiovascular: No lower extremity edema, non tender, no erythema  Neck exam has only 5 degrees of extension.  Significant limitation with sidebending and rotation.  Significant cachectic appearance and does have more atrophy of the shoulder girdle bilaterally.    Impression and Recommendations:     The above documentation has been reviewed and is accurate and complete Lyndal Pulley, DO

## 2022-04-18 ENCOUNTER — Ambulatory Visit (INDEPENDENT_AMBULATORY_CARE_PROVIDER_SITE_OTHER): Payer: Medicare Other | Admitting: Family Medicine

## 2022-04-18 VITALS — BP 102/70 | HR 70 | Ht 61.0 in | Wt 110.0 lb

## 2022-04-18 DIAGNOSIS — M5010 Cervical disc disorder with radiculopathy, unspecified cervical region: Secondary | ICD-10-CM

## 2022-04-18 DIAGNOSIS — M5416 Radiculopathy, lumbar region: Secondary | ICD-10-CM | POA: Diagnosis not present

## 2022-04-18 MED ORDER — KETOROLAC TROMETHAMINE 60 MG/2ML IM SOLN
60.0000 mg | Freq: Once | INTRAMUSCULAR | Status: AC
  Administered 2022-04-18: 60 mg via INTRAMUSCULAR

## 2022-04-18 MED ORDER — METHYLPREDNISOLONE ACETATE 80 MG/ML IJ SUSP
80.0000 mg | Freq: Once | INTRAMUSCULAR | Status: AC
  Administered 2022-04-18: 80 mg via INTRAMUSCULAR

## 2022-04-18 NOTE — Patient Instructions (Addendum)
Good to see you  Injections in backside today Placard paperwork filled out Follow up in 3 months

## 2022-04-18 NOTE — Assessment & Plan Note (Signed)
Chronic problem with exacerbation.  Still limitation of range of motion.  Due to patient's other to treat.  Patient continues to use tobacco.  Continues to have shortness of breath patient cannot be quite as active as she would like to be.  Toradol and Depo-Medrol is helpful.  Patient is being followed by multiple other providers for her other chronic conditions.  With the pain we can do these injections every follow-up again for further evaluation in 3 months

## 2022-04-19 DIAGNOSIS — F99 Mental disorder, not otherwise specified: Secondary | ICD-10-CM | POA: Diagnosis not present

## 2022-04-26 DIAGNOSIS — F99 Mental disorder, not otherwise specified: Secondary | ICD-10-CM | POA: Diagnosis not present

## 2022-04-28 ENCOUNTER — Telehealth: Payer: Self-pay

## 2022-04-28 NOTE — Telephone Encounter (Signed)
Called to get pt scheduled for [facility]. Pt declined. States she is not feeling well.

## 2022-05-03 DIAGNOSIS — F99 Mental disorder, not otherwise specified: Secondary | ICD-10-CM | POA: Diagnosis not present

## 2022-05-04 ENCOUNTER — Other Ambulatory Visit: Payer: Self-pay | Admitting: Internal Medicine

## 2022-05-05 ENCOUNTER — Other Ambulatory Visit: Payer: Self-pay | Admitting: Nurse Practitioner

## 2022-05-15 ENCOUNTER — Other Ambulatory Visit: Payer: Self-pay | Admitting: Nurse Practitioner

## 2022-05-15 MED ORDER — CYCLOBENZAPRINE HCL 10 MG PO TABS: 10.0000 mg | ORAL_TABLET | Freq: Three times a day (TID) | ORAL | 0 refills | Status: DC | PRN

## 2022-05-17 DIAGNOSIS — F99 Mental disorder, not otherwise specified: Secondary | ICD-10-CM | POA: Diagnosis not present

## 2022-05-24 DIAGNOSIS — F99 Mental disorder, not otherwise specified: Secondary | ICD-10-CM | POA: Diagnosis not present

## 2022-05-25 ENCOUNTER — Other Ambulatory Visit: Payer: Self-pay | Admitting: Nurse Practitioner

## 2022-06-01 DIAGNOSIS — F99 Mental disorder, not otherwise specified: Secondary | ICD-10-CM | POA: Diagnosis not present

## 2022-06-14 DIAGNOSIS — F99 Mental disorder, not otherwise specified: Secondary | ICD-10-CM | POA: Diagnosis not present

## 2022-06-15 ENCOUNTER — Other Ambulatory Visit: Payer: Self-pay

## 2022-06-15 MED ORDER — METOPROLOL TARTRATE 25 MG PO TABS: 25.0000 mg | ORAL_TABLET | Freq: Two times a day (BID) | ORAL | 1 refills | Status: DC

## 2022-06-16 ENCOUNTER — Other Ambulatory Visit: Payer: Self-pay | Admitting: Nurse Practitioner

## 2022-06-22 ENCOUNTER — Encounter: Payer: Self-pay | Admitting: Nurse Practitioner

## 2022-06-22 ENCOUNTER — Ambulatory Visit (INDEPENDENT_AMBULATORY_CARE_PROVIDER_SITE_OTHER): Payer: Medicare Other | Admitting: Nurse Practitioner

## 2022-06-22 VITALS — BP 132/80 | HR 60 | Temp 98.6°F | Ht 61.0 in | Wt 118.6 lb

## 2022-06-22 DIAGNOSIS — I10 Essential (primary) hypertension: Secondary | ICD-10-CM

## 2022-06-22 DIAGNOSIS — R7303 Prediabetes: Secondary | ICD-10-CM

## 2022-06-22 DIAGNOSIS — M150 Primary generalized (osteo)arthritis: Secondary | ICD-10-CM

## 2022-06-22 DIAGNOSIS — I700 Atherosclerosis of aorta: Secondary | ICD-10-CM | POA: Diagnosis not present

## 2022-06-22 DIAGNOSIS — M159 Polyosteoarthritis, unspecified: Secondary | ICD-10-CM

## 2022-06-22 DIAGNOSIS — I5181 Takotsubo syndrome: Secondary | ICD-10-CM | POA: Diagnosis not present

## 2022-06-22 DIAGNOSIS — K5000 Crohn's disease of small intestine without complications: Secondary | ICD-10-CM

## 2022-06-22 DIAGNOSIS — E559 Vitamin D deficiency, unspecified: Secondary | ICD-10-CM

## 2022-06-22 DIAGNOSIS — E278 Other specified disorders of adrenal gland: Secondary | ICD-10-CM

## 2022-06-22 DIAGNOSIS — F17218 Nicotine dependence, cigarettes, with other nicotine-induced disorders: Secondary | ICD-10-CM

## 2022-06-22 DIAGNOSIS — J439 Emphysema, unspecified: Secondary | ICD-10-CM | POA: Diagnosis not present

## 2022-06-22 DIAGNOSIS — E782 Mixed hyperlipidemia: Secondary | ICD-10-CM

## 2022-06-22 DIAGNOSIS — Z720 Tobacco use: Secondary | ICD-10-CM | POA: Insufficient documentation

## 2022-06-22 LAB — VITAMIN D 25 HYDROXY (VIT D DEFICIENCY, FRACTURES): VITD: 17.25 ng/mL — ABNORMAL LOW (ref 30.00–100.00)

## 2022-06-22 LAB — CBC
HCT: 43.2 % (ref 36.0–46.0)
Hemoglobin: 14.3 g/dL (ref 12.0–15.0)
MCHC: 33.1 g/dL (ref 30.0–36.0)
MCV: 95.1 fl (ref 78.0–100.0)
Platelets: 268 10*3/uL (ref 150.0–400.0)
RBC: 4.54 Mil/uL (ref 3.87–5.11)
RDW: 13.5 % (ref 11.5–15.5)
WBC: 6.6 10*3/uL (ref 4.0–10.5)

## 2022-06-22 LAB — COMPREHENSIVE METABOLIC PANEL
ALT: 20 U/L (ref 0–35)
AST: 19 U/L (ref 0–37)
Albumin: 4.5 g/dL (ref 3.5–5.2)
Alkaline Phosphatase: 71 U/L (ref 39–117)
BUN: 22 mg/dL (ref 6–23)
CO2: 31 mEq/L (ref 19–32)
Calcium: 10.1 mg/dL (ref 8.4–10.5)
Chloride: 100 mEq/L (ref 96–112)
Creatinine, Ser: 0.7 mg/dL (ref 0.40–1.20)
GFR: 86.5 mL/min (ref >60.00)
Glucose, Bld: 90 mg/dL (ref 70–99)
Potassium: 4.5 mEq/L (ref 3.5–5.1)
Sodium: 139 mEq/L (ref 135–145)
Total Bilirubin: 0.6 mg/dL (ref 0.2–1.2)
Total Protein: 7 g/dL (ref 6.0–8.3)

## 2022-06-22 LAB — LIPID PANEL
Cholesterol: 205 mg/dL — ABNORMAL HIGH (ref 0–200)
HDL: 75.7 mg/dL (ref >39.00)
LDL Cholesterol: 95 mg/dL (ref 0–99)
NonHDL: 129.48
Total CHOL/HDL Ratio: 3
Triglycerides: 171 mg/dL — ABNORMAL HIGH (ref 0.0–149.0)
VLDL: 34.2 mg/dL (ref 0.0–40.0)

## 2022-06-22 LAB — HEMOGLOBIN A1C: Hgb A1c MFr Bld: 5.9 % (ref 4.6–6.5)

## 2022-06-22 NOTE — Progress Notes (Signed)
Established Patient Office Visit  Subjective   Patient ID: Erica [name], female    DOB: [birth year]  Age: 72 y.o. MRN: [id number]  Chief Complaint  Patient presents with   Hypertension    Discuss the Shingrix Vaccine and arthritis    HPI  Erica [name] is here to follow-up on chronic disease management.   She is having ongoing arthrtitis pain in her back, neck, shoulders and hands. She will take tylenol as needed for pain. She is following with Dr. [name]. She has an appointment with him next month.  HYPERTENSION / HYPERLIPIDEMIA  Satisfied with current treatment? yes Duration of hypertension: chronic BP medication side effects: no Past BP meds: metoprolol Duration of hyperlipidemia: chronic Cholesterol medication side effects: no Cholesterol supplements: none Past cholesterol medications: rosuvastatin Medication compliance: excellent compliance Aspirin: yes Recent stressors: no Recurrent headaches: no Visual changes: no Palpitations: no Dyspnea: no Chest pain: no Lower extremity edema: no Dizzy/lightheaded: no     ROS See pertinent positives and negatives per HPI.    Objective:     BP 132/80 (BP Location: Left Arm, Cuff Size: Normal)   Pulse 60   Temp 98.6 F (37 C)   Ht 5\' 1"  (1.549 m)   Wt 118 lb 9.6 oz (53.8 kg)   LMP  (LMP Unknown)   SpO2 98%   BMI 22.41 kg/m    Physical Exam Vitals and nursing note reviewed.  Constitutional:      General: She is not in acute distress.    Appearance: Normal appearance.  HENT:     Head: Normocephalic.  Eyes:     Conjunctiva/sclera: Conjunctivae normal.  Cardiovascular:     Rate and Rhythm: Normal rate and regular rhythm.     Pulses: Normal pulses.     Heart sounds: Normal heart sounds.  Pulmonary:     Effort: Pulmonary effort is normal.     Breath sounds: Normal breath sounds.  Musculoskeletal:     Cervical back: Normal range of motion.  Skin:    General: Skin is warm.  Neurological:      General: No focal deficit present.     Mental Status: She is alert and oriented to person, place, and time.  Psychiatric:        Mood and Affect: Mood normal.        Behavior: Behavior normal.        Thought Content: Thought content normal.        Judgment: Judgment normal.      Results for orders placed or performed in visit on 06/22/22  CBC  Result Value Ref Range   WBC 6.6 4.0 - 10.5 K/uL   RBC 4.54 3.87 - 5.11 Mil/uL   Platelets 268.0 150.0 - 400.0 K/uL   Hemoglobin 14.3 12.0 - 15.0 g/dL   HCT 43.2 36.0 - 46.0 %   MCV 95.1 78.0 - 100.0 fl   MCHC 33.1 30.0 - 36.0 g/dL   RDW 13.5 11.5 - 15.5 %  Comprehensive metabolic panel  Result Value Ref Range   Sodium 139 135 - 145 mEq/L   Potassium 4.5 3.5 - 5.1 mEq/L   Chloride 100 96 - 112 mEq/L   CO2 31 19 - 32 mEq/L   Glucose, Bld 90 70 - 99 mg/dL   BUN 22 6 - 23 mg/dL   Creatinine, Ser 0.70 0.40 - 1.20 mg/dL   Total Bilirubin 0.6 0.2 - 1.2 mg/dL   Alkaline Phosphatase 71 39 - 117 U/L   AST  19 0 - 37 U/L   ALT 20 0 - 35 U/L   Total Protein 7.0 6.0 - 8.3 g/dL   Albumin 4.5 3.5 - 5.2 g/dL   GFR 86.50 >60.00 mL/min   Calcium 10.1 8.4 - 10.5 mg/dL  Lipid panel  Result Value Ref Range   Cholesterol 205 (H) 0 - 200 mg/dL   Triglycerides 171.0 (H) 0.0 - 149.0 mg/dL   HDL 75.70 >39.00 mg/dL   VLDL 34.2 0.0 - 40.0 mg/dL   LDL Cholesterol 95 0 - 99 mg/dL   Total CHOL/HDL Ratio 3    NonHDL 129.48   Hemoglobin A1c  Result Value Ref Range   Hgb A1c MFr Bld 5.9 4.6 - 6.5 %  VITAMIN D 25 Hydroxy (Vit-D Deficiency, Fractures)  Result Value Ref Range   VITD 17.25 (L) 30.00 - 100.00 ng/mL      The 10-year ASCVD risk score (Arnett DK, et al., 2019) is: 24%    Assessment & Plan:   Problem List Items Addressed This Visit       Cardiovascular and Mediastinum   Essential hypertension    Chronic, stable.  BP today 132-80.  Continue metoprolol 25 mg twice daily.  Check CMP, CBC today.       Relevant Orders   CBC (Completed)    Comprehensive metabolic panel (Completed)   Lipid panel (Completed)   Takotsubo syndrome    She is currently following with Dr. Daneen Schick of cardiology.  She can continue metoprolol 25 mg twice a day, aspirin 81 mg daily, and rosuvastatin 10 mg on Monday Wednesday Friday.  Will check labs next visit.      Relevant Orders   CBC (Completed)   Comprehensive metabolic panel (Completed)   Lipid panel (Completed)   Aortic atherosclerosis ([place]) - Primary    Continue aspirin 81mg  daily and rosuvastatin Monday, Wednesday, Friday.         Respiratory   COPD (chronic obstructive pulmonary disease) (HCC)    Chronic, stable.  She is currently using albuterol inhaler or nebulizer as needed for shortness of breath or wheezing.  She states that her symptoms are overall well-controlled.  She states that she was following with Dr. Melvyn Novas, however do not see any recent notes from him.  She has tried Pulmicort in the past, however it caused diarrhea and she was unable to tolerate it.  Will have her continue current regimen.        Musculoskeletal and Integument   Osteoarthritis    Chronic, stable.  She states that she is following with Dr. [name] with sports medicine and is taking cyclobenzaprine 5 mg as needed for pain.  She was taking hydrocodone, however she stopped this as noted above and states that she has not noticed a difference in her pain.  Continue lidocaine patches and tylenol arthritis.         Other   Hyperlipidemia    Chronic, stable.  Continue rosuvastatin Monday Wednesday Friday.  Check CMP, CBC, lipid panel today.       Vitamin D deficiency    Check vitamin D levels today and treat based on results .      Relevant Orders   VITAMIN D 25 Hydroxy (Vit-D Deficiency, Fractures) (Completed)   Crohn disease (HCC)    Chronic, stable.  She states that she is following with GI, however unable to see any notes.  She is not currently taking any medication for this.  Continue collaboration  recommendations from GI.  Nicotine dependence    Check CT lung for lung cancer screening.       Relevant Orders   CT CHEST LUNG CANCER SCREENING LOW DOSE WO CONTRAST   Adrenal mass, left ([place])    MRI of lumbar spine on 07/23/21 re-showed unchanged left adrenal adenoma measuring up to 3.0 cm. No follow up needed.       Pre-diabetes    Check A1c and treat based on results.       Relevant Orders   Hemoglobin A1c (Completed)    Return in about 6 months (around 12/23/2022) for chronic disease management.    Charyl Dancer, NP

## 2022-06-22 NOTE — Patient Instructions (Addendum)
It was great to see you!  We are checking your labs today and will let you know the results via mychart/phone.   Let me know if you need any refills  Keep taking tylenol arthritis as needed for pain and using lidocaine patches.   If you decide to get your shingles vaccine, you can get this at your pharmacy.   Let's follow-up in 6 months, sooner if you have concerns.  If a referral was placed today, you will be contacted for an appointment. Please note that routine referrals can sometimes take up to 3-4 weeks to process. Please call our office if you haven't heard anything after this time frame.  Take care,  Vance Peper, NP

## 2022-06-23 MED ORDER — VITAMIN D (ERGOCALCIFEROL) 1.25 MG (50000 UNIT) PO CAPS: 50000.0000 [IU] | ORAL_CAPSULE | ORAL | 0 refills | Status: DC

## 2022-06-25 NOTE — Assessment & Plan Note (Signed)
Chronic, stable.  BP today 132-80.  Continue metoprolol 25 mg twice daily.  Check CMP, CBC today.

## 2022-06-25 NOTE — Assessment & Plan Note (Signed)
Check vitamin D levels today and treat based on results. 

## 2022-06-25 NOTE — Assessment & Plan Note (Signed)
Check A1c and treat based on results. 

## 2022-06-25 NOTE — Assessment & Plan Note (Signed)
She is currently following with Dr. Henry Smith of cardiology.  She can continue metoprolol 25 mg twice a day, aspirin 81 mg daily, and rosuvastatin 10 mg on Monday Wednesday Friday.  Will check labs next visit. 

## 2022-06-25 NOTE — Assessment & Plan Note (Signed)
Chronic, stable.  She is currently using albuterol inhaler or nebulizer as needed for shortness of breath or wheezing.  She states that her symptoms are overall well-controlled.  She states that she was following with Dr. Melvyn Novas, however do not see any recent notes from him.  She has tried Pulmicort in the past, however it caused diarrhea and she was unable to tolerate it.  Will have her continue current regimen.

## 2022-06-25 NOTE — Assessment & Plan Note (Signed)
MRI of lumbar spine on 07/23/21 re-showed unchanged left adrenal adenoma measuring up to 3.0 cm. No follow up needed.

## 2022-06-25 NOTE — Assessment & Plan Note (Signed)
Check CT lung for lung cancer screening.

## 2022-06-25 NOTE — Assessment & Plan Note (Signed)
Continue aspirin 81mg  daily and rosuvastatin Monday, Wednesday, Friday.

## 2022-06-25 NOTE — Assessment & Plan Note (Signed)
Chronic, stable.  She states that she is following with GI, however unable to see any notes.  She is not currently taking any medication for this.  Continue collaboration recommendations from GI. 

## 2022-06-25 NOTE — Assessment & Plan Note (Signed)
Chronic, stable.  Continue rosuvastatin Monday Wednesday Friday.  Check CMP, CBC, lipid panel today.

## 2022-06-25 NOTE — Assessment & Plan Note (Signed)
Chronic, stable.  She states that she is following with Dr. [name] with sports medicine and is taking cyclobenzaprine 5 mg as needed for pain.  She was taking hydrocodone, however she stopped this as noted above and states that she has not noticed a difference in her pain.  Continue lidocaine patches and tylenol arthritis.

## 2022-07-05 DIAGNOSIS — F99 Mental disorder, not otherwise specified: Secondary | ICD-10-CM | POA: Diagnosis not present

## 2022-07-09 ENCOUNTER — Other Ambulatory Visit: Payer: Self-pay | Admitting: Nurse Practitioner

## 2022-07-11 DIAGNOSIS — F99 Mental disorder, not otherwise specified: Secondary | ICD-10-CM | POA: Diagnosis not present

## 2022-07-12 DIAGNOSIS — F99 Mental disorder, not otherwise specified: Secondary | ICD-10-CM | POA: Diagnosis not present

## 2022-07-17 NOTE — Progress Notes (Unsigned)
[name] [address] [place] [address] Phone: [phone] Subjective:   Erica [name], am serving as a scribe for Dr. Antoine Primas.  I'm seeing this patient by the request  of:  McElwee, [name], NP  CC: cervical and lumbar pain and all over pain   [id number]  04/18/2022 Chronic problem with exacerbation. Still limitation of range of motion. Due to patient's other to treat. Patient continues to use tobacco. Continues to have shortness of breath patient cannot be quite as active as she would like to be. Toradol and Depo-Medrol is helpful. Patient is being followed by multiple other providers for her other chronic conditions. With the pain we can do these injections every follow-up again for further evaluation in 3 months   Update 07/18/2022 Erica [name] is a 72 y.o. female coming in with complaint of cervical and lumbar spine pain. Patient states that her pain is worsening due to the weather. Has pain everywhere.        Past Medical History:  Diagnosis Date   Acute duodenitis 04/24/2017   Allergy    ANEMIA    Anxiety    BACK PAIN, LUMBAR    Cancer of ascending colon [id number] s/p colectomy 03/05/2009 02/18/2010   Qualifier: Diagnosis of  By: Leone Payor MD, Alfonse Ras E    CHF (congestive heart failure) (HCC)    Complete rotator cuff tear of left shoulder 11/27/2013   Ultrasound guided injection on November 27, 2013    COPD (chronic obstructive pulmonary disease) with emphysema (HCC)    COPD exacerbation (HCC) 11/14/2017   Crohn's 02/2010   ileal ulcers, intol of entercort--refuses treatment   Emphysema of lung (HCC)    GERD    History of blood transfusion    w/ c/s surgery   History of transfusion of whole blood    HYPERLIPIDEMIA    HYPERTENSION    Microscopic hematuria    chronic   Myocardial infarction (HCC) 2010   Narcotic dependence (HCC) 05/25/2017   Obstruction of intestine or colon (HCC)    adhesions    OSTEOARTHRITIS    Rectal fissure    Possible fissure   Rotator cuff tear    Takotsubo syndrome 12/2008   URINARY INCONTINENCE    Past Surgical History:  Procedure Laterality Date   ABDOMINAL HYSTERECTOMY  1994   APPENDECTOMY  1964   BLADDER SUSPENSION     CESAREAN SECTION     x 3   CHOLECYSTECTOMY  1995   COLONOSCOPY  2017   COLONOSCOPY W/ BIOPSIES AND POLYPECTOMY  01/24/2011   (Crohn's)ileitis, internal hemorrhoids   ECTOPIC PREGNANCY SURGERY     ESOPHAGOGASTRODUODENOSCOPY     FACIAL COSMETIC SURGERY     HAND SURGERY Bilateral 1991   x 2   KNEE SURGERY Right 1981   LYSIS OF ADHESION  2010   ex lap/LOA for SBO Dr Johna Sheriff   ORBITAL FRACTURE SURGERY  1990   RIGHT COLECTOMY  03/2009   Right colectomy for colon CA.  Dr Dwain Sarna   TONSILLECTOMY  1952   TOTAL KNEE ARTHROPLASTY  03/2010   Dr Charlann Boxer.  Depuy   UPPER GASTROINTESTINAL ENDOSCOPY  05/16/2010   normal   UTERINE SUSPENSION     VIDEO BRONCHOSCOPY Bilateral 12/18/2014   Procedure: VIDEO BRONCHOSCOPY WITHOUT FLUORO;  Surgeon: Nyoka Cowden, MD;  Location: [facility];  Service: Cardiopulmonary;  Laterality: Bilateral;   Social History   Socioeconomic History   Marital status: Widowed  Spouse name: Not on file   Number of children: 5   Years of education: 14   Highest education level: High school graduate  Occupational History   Occupation: Retired     Associate Professor: UNEMPLOYED  Tobacco Use   Smoking status: Every Day    Packs/day: 1.00    Years: 53.00    Additional pack years: 0.00    Total pack years: 53.00    Types: Cigarettes    Passive exposure: Never   Smokeless tobacco: Never  Vaping Use   Vaping Use: Never used  Substance and Sexual Activity   Alcohol use: Yes    Comment: occasional mixed drink   Drug use: No   Sexual activity: Not Currently    Birth control/protection: Surgical    Comment: Hysterectomy  Other Topics Concern   Not on file  Social History Narrative   Not on file   Social  Determinants of Health   Financial Resource Strain: Low Risk  (11/14/2021)   Overall Financial Resource Strain (CARDIA)    Difficulty of Paying Living Expenses: Not hard at all  Food Insecurity: No Food Insecurity (11/14/2021)   Hunger Vital Sign    Worried About Running Out of Food in the Last Year: Never true    Ran Out of Food in the Last Year: Never true  Transportation Needs: No Transportation Needs (11/14/2021)   PRAPARE - Administrator, Civil Service (Medical): No    Lack of Transportation (Non-Medical): No  Physical Activity: Sufficiently Active (11/14/2021)   Exercise Vital Sign    Days of Exercise per Week: 5 days    Minutes of Exercise per Session: 30 min  Stress: No Stress Concern Present (11/14/2021)   [employer] of Occupational Health - Occupational Stress Questionnaire    Feeling of Stress : Not at all  Social Connections: Moderately Integrated (11/14/2021)   Social Connection and Isolation Panel [NHANES]    Frequency of Communication with Friends and Family: Three times a week    Frequency of Social Gatherings with Friends and Family: Three times a week    Attends Religious Services: More than 4 times per year    Active Member of Clubs or Organizations: Yes    Attends Banker Meetings: More than 4 times per year    Marital Status: Widowed   Allergies  Allergen Reactions   Morphine Nausea And Vomiting   Family History  Problem Relation Age of Onset   Colon cancer Father 53   Hypertension Father    Heart disease Father    Kidney disease Father    Colon cancer Sister 20   Liver cancer Sister    Other Sister        amyloidosis   Throat cancer Mother    Arthritis Other        Parent, other relative   Stomach cancer Neg Hx    Rectal cancer Neg Hx      Current Outpatient Medications (Cardiovascular):    metoprolol tartrate (LOPRESSOR) 25 MG tablet, Take 1 tablet (25 mg total) by mouth 2 (two) times daily.   rosuvastatin (CRESTOR)  10 MG tablet, TAKE 1 TABLET BY MOUTH 5 TIMES A WEEK ON  MONDAY-FRIDAY  Current Outpatient Medications (Respiratory):    albuterol (PROVENTIL) (2.5 MG/3ML) 0.083% nebulizer solution, Take 3 mLs (2.5 mg total) by nebulization every 6 (six) hours as needed for wheezing or shortness of breath.   albuterol (VENTOLIN HFA) 108 (90 Base) MCG/ACT inhaler, INHALE 2 PUFFS  BY MOUTH EVERY 6 HOURS AS NEEDED FOR WHEEZING FOR SHORTNESS OF BREATH   fluticasone (FLONASE) 50 MCG/ACT nasal spray, Place 2 sprays into both nostrils daily.  Current Outpatient Medications (Analgesics):    aspirin EC 81 MG tablet, Take 81 mg by mouth daily.   Current Outpatient Medications (Other):    cyclobenzaprine (FLEXERIL) 10 MG tablet, Take 1 tablet (10 mg total) by mouth 3 (three) times daily as needed. for muscle spams   Vitamin D, Ergocalciferol, (DRISDOL) 1.25 MG (50000 UNIT) CAPS capsule, Take 1 capsule (50,000 Units total) by mouth every 7 (seven) days.   Reviewed prior external information including notes and imaging from  primary care provider As well as notes that were available from care everywhere and other healthcare systems.  Past medical history, social, surgical and family history all reviewed in electronic medical record.  No pertanent information unless stated regarding to the chief complaint.   Review of Systems:  No headache, visual changes, nausea, vomiting, diarrhea, constipation, dizziness, abdominal pain, skin rash, fevers, chills, night sweats, weight loss, swollen lymph nodes, joint swelling, chest pain, shortness of breath, mood changes. POSITIVE muscle aches, body aches  Objective  Blood pressure 120/78, height  (1.549 m), weight 122 lb (55.3 kg).   General: No apparent distress alert and oriented x3 mood and affect normal, dressed appropriately.  HEENT: Pupils equal, extraocular movements intact  Respiratory: Patient's speak in full sentences but does have a mild cough at  baseline. Cardiovascular: No lower extremity edema, non tender, no erythema  Low back has loss of lordosis patient also has loss of lordosis of the cervical spine.  Patient has weakness noted of more of the axial skeleton.  Some atrophy noted of the arms and legs.  Seems worse than usual.      Impression and Recommendations:    The above documentation has been reviewed and is accurate and complete Judi Saa, DO

## 2022-07-18 ENCOUNTER — Ambulatory Visit (INDEPENDENT_AMBULATORY_CARE_PROVIDER_SITE_OTHER): Payer: Medicare Other | Admitting: Family Medicine

## 2022-07-18 VITALS — BP 120/78 | Ht 61.0 in | Wt 122.0 lb

## 2022-07-18 DIAGNOSIS — M5416 Radiculopathy, lumbar region: Secondary | ICD-10-CM | POA: Diagnosis not present

## 2022-07-18 DIAGNOSIS — M5010 Cervical disc disorder with radiculopathy, unspecified cervical region: Secondary | ICD-10-CM | POA: Diagnosis not present

## 2022-07-18 DIAGNOSIS — M12811 Other specific arthropathies, not elsewhere classified, right shoulder: Secondary | ICD-10-CM

## 2022-07-18 MED ORDER — KETOROLAC TROMETHAMINE 30 MG/ML IJ SOLN
30.0000 mg | Freq: Once | INTRAMUSCULAR | Status: AC
  Administered 2022-07-18: 30 mg via INTRAMUSCULAR

## 2022-07-18 MED ORDER — METHYLPREDNISOLONE ACETATE 40 MG/ML IJ SUSP
40.0000 mg | Freq: Once | INTRAMUSCULAR | Status: AC
  Administered 2022-07-18: 40 mg via INTRAMUSCULAR

## 2022-07-18 NOTE — Assessment & Plan Note (Signed)
Patient does not want any injections.  Wants to continue with conservative therapy.  Discussed potential need for replacement if this continues but she would not want any surgical intervention

## 2022-07-18 NOTE — Assessment & Plan Note (Signed)
Continues to have chronic pain.  Worsening symptoms.  Affecting daily activities.  Patient has this as well as multiple different other modalities that is contributing.  We discussed with patient that she does need to follow-up with other specialist including gastroenterology as well as pulmonology.  Social determinants of health including patient continues to smoke.  Patient continues to worsen over the course of time.  Discussed different treatment options again about physical therapy and medications.  Patient wants to continue with just the Toradol and Depo-Medrol injections but would like to see if we could potentially have more frequent prep.  Discussed with her that I would like to see her again in 2 months.  Encouraged her to follow-up and have the scheduled imaging to see if there is any other reasons for worsening symptoms.  Patient knows also if acutely worsening to seek medical attention immediately.

## 2022-07-18 NOTE — Patient Instructions (Signed)
Keep doing once weekly Vit D Injection today See you again in 2 months

## 2022-07-19 DIAGNOSIS — F99 Mental disorder, not otherwise specified: Secondary | ICD-10-CM | POA: Diagnosis not present

## 2022-07-26 DIAGNOSIS — F99 Mental disorder, not otherwise specified: Secondary | ICD-10-CM | POA: Diagnosis not present

## 2022-08-01 ENCOUNTER — Other Ambulatory Visit: Payer: Self-pay | Admitting: Nurse Practitioner

## 2022-08-01 NOTE — Telephone Encounter (Signed)
Requesting: Albuterol Sulfate HFA 108 (90 Base) MCG/ACT Inhalation Aerosol Solution  Last Visit: 06/22/2022 Next Visit: 12/25/2022 Last Refill: 07/11/2022  Please Advise

## 2022-08-02 ENCOUNTER — Other Ambulatory Visit: Payer: Self-pay | Admitting: Internal Medicine

## 2022-08-02 DIAGNOSIS — F99 Mental disorder, not otherwise specified: Secondary | ICD-10-CM | POA: Diagnosis not present

## 2022-08-08 ENCOUNTER — Ambulatory Visit
Admission: RE | Admit: 2022-08-08 | Discharge: 2022-08-08 | Disposition: A | Discharge status: Home / Self Care | Payer: Medicare Other | Source: Ambulatory Visit | Attending: Nurse Practitioner | Admitting: Nurse Practitioner

## 2022-08-08 DIAGNOSIS — F17218 Nicotine dependence, cigarettes, with other nicotine-induced disorders: Secondary | ICD-10-CM | POA: Diagnosis not present

## 2022-08-08 DIAGNOSIS — F17200 Nicotine dependence, unspecified, uncomplicated: Secondary | ICD-10-CM | POA: Diagnosis not present

## 2022-08-08 DIAGNOSIS — Z122 Encounter for screening for malignant neoplasm of respiratory organs: Secondary | ICD-10-CM | POA: Diagnosis not present

## 2022-08-09 DIAGNOSIS — F99 Mental disorder, not otherwise specified: Secondary | ICD-10-CM | POA: Diagnosis not present

## 2022-08-10 ENCOUNTER — Telehealth: Payer: Self-pay | Admitting: Nurse Practitioner

## 2022-08-10 NOTE — Telephone Encounter (Signed)
Patient wanted her CT scan results. I told patient that the results have not been released but as soon as they result I will call her.

## 2022-08-10 NOTE — Telephone Encounter (Signed)
Caller Name: Remonia Call back phone #: [phone]  Reason for Call: Pt does not have mychart so she would like a call with her results.

## 2022-08-11 ENCOUNTER — Other Ambulatory Visit: Payer: Self-pay | Admitting: Nurse Practitioner

## 2022-08-11 ENCOUNTER — Telehealth: Payer: Self-pay

## 2022-08-11 DIAGNOSIS — R911 Solitary pulmonary nodule: Secondary | ICD-10-CM

## 2022-08-11 NOTE — Telephone Encounter (Signed)
Chest CT scan report from [facility] called to clarify that the provider see report received and viewed by provider.

## 2022-08-11 NOTE — Telephone Encounter (Signed)
Noted, called patient with results.  

## 2022-08-16 DIAGNOSIS — F99 Mental disorder, not otherwise specified: Secondary | ICD-10-CM | POA: Diagnosis not present

## 2022-08-21 ENCOUNTER — Ambulatory Visit
Admission: RE | Admit: 2022-08-21 | Discharge: 2022-08-21 | Disposition: A | Discharge status: Home / Self Care | Payer: Medicare Other | Source: Ambulatory Visit | Attending: Nurse Practitioner | Admitting: Nurse Practitioner

## 2022-08-21 ENCOUNTER — Telehealth: Payer: Self-pay | Admitting: Nurse Practitioner

## 2022-08-21 DIAGNOSIS — Z1231 Encounter for screening mammogram for malignant neoplasm of breast: Secondary | ICD-10-CM

## 2022-08-21 MED ORDER — HYDROXYZINE HCL 10 MG PO TABS: 10.0000 mg | ORAL_TABLET | Freq: Three times a day (TID) | ORAL | 0 refills | Status: DC | PRN

## 2022-08-21 NOTE — Addendum Note (Signed)
Addended by: Rodman Pickle A on: 08/21/2022 10:22 AM   Modules accepted: Orders

## 2022-08-21 NOTE — Telephone Encounter (Signed)
I called and spoke with patient and she said that her appointment tomorrow is to discuss the nodule in her lungs. She said to thank you for sending in the medication and she wanted to prepare herself.

## 2022-08-21 NOTE — Telephone Encounter (Signed)
Pt has an appt tomorrow to talk about a suspicious lump in her breast. Asking if you will prescribe something for her anxiety about the results. Has Mamm appt today in bus.

## 2022-08-22 ENCOUNTER — Ambulatory Visit (INDEPENDENT_AMBULATORY_CARE_PROVIDER_SITE_OTHER): Payer: Medicare Other | Admitting: Emergency Medicine

## 2022-08-22 ENCOUNTER — Encounter: Payer: Self-pay | Admitting: Emergency Medicine

## 2022-08-22 VITALS — BP 128/74 | HR 89 | Temp 98.3°F | Ht 60.0 in | Wt 118.6 lb

## 2022-08-22 DIAGNOSIS — F17210 Nicotine dependence, cigarettes, uncomplicated: Secondary | ICD-10-CM

## 2022-08-22 DIAGNOSIS — J439 Emphysema, unspecified: Secondary | ICD-10-CM

## 2022-08-22 DIAGNOSIS — R911 Solitary pulmonary nodule: Secondary | ICD-10-CM | POA: Diagnosis not present

## 2022-08-22 NOTE — Progress Notes (Signed)
Subjective:    Patient ID: Erica [name], female    DOB: Apr 19, 1950, 72 y.o.   MRN: [id number]  HPI 72 year old woman, active smoker (53 pack years, smokes 1 pack daily), carries a diagnosis of COPD.  She has hypertension, hyperlipidemia, remote history of colon cancer (2010), osteoarthritis, crohn's disease.  She has been seen in our office in the past by Dr. Sherene Sires.  She has participated in the lung cancer screening program, most recent scan done on 08/08/2022.  She is here today to discuss that scan. She is on albuterol - has nebs that she uses every several days. She will have dyspnea with stairs especially when carrying groceries. Minimal cough. She does get congestion and allergies. She can occasionally hear wheeze.    Lung cancer screening CT scan of the chest 08/08/2022 reviewed by me shows centrilobular emphysema and evidence for respiratory bronchiolitis, multiple small stable pulmonary nodules, a new 14 mm spiculated medial right lower lobe nodule concerning for malignancy.  Pulmonary function testing 07/18/2013 reviewed by me showed severe obstruction without a bronchodilator response.  FEV1 1.35 L (58% predicted), lung volumes hyperinflated, decreased diffusion capacity.   Review of Systems As per HPI  Past Medical History:  Diagnosis Date   Acute duodenitis 04/24/2017   Allergy    ANEMIA    Anxiety    BACK PAIN, LUMBAR    Cancer of ascending colon pTispN0 s/p colectomy 03/05/2009 02/18/2010   Qualifier: Diagnosis of  By: Leone Payor MD, Alfonse Ras E    CHF (congestive heart failure) (HCC)    Complete rotator cuff tear of left shoulder 11/27/2013   Ultrasound guided injection on November 27, 2013    COPD (chronic obstructive pulmonary disease) with emphysema (HCC)    COPD exacerbation (HCC) 11/14/2017   Crohn's 02/2010   ileal ulcers, intol of entercort--refuses treatment   Emphysema of lung (HCC)    GERD    History of blood transfusion    w/ c/s surgery   History of  transfusion of whole blood    HYPERLIPIDEMIA    HYPERTENSION    Microscopic hematuria    chronic   Myocardial infarction (HCC) 2010   Narcotic dependence (HCC) 05/25/2017   Obstruction of intestine or colon (HCC)    adhesions   OSTEOARTHRITIS    Rectal fissure    Possible fissure   Rotator cuff tear    Takotsubo syndrome 12/2008   URINARY INCONTINENCE      Family History  Problem Relation Age of Onset   Throat cancer Mother    Colon cancer Father 60   Hypertension Father    Heart disease Father    Kidney disease Father    Colon cancer Sister 7   Liver cancer Sister    Other Sister        amyloidosis   Arthritis Other        Parent, other relative   Stomach cancer Neg Hx    Rectal cancer Neg Hx    Breast cancer Neg Hx     No family hx lung CA  Social History   Socioeconomic History   Marital status: Widowed    Spouse name: Not on file   Number of children: 5   Years of education: 14   Highest education level: High school graduate  Occupational History   Occupation: Retired     Associate Professor: UNEMPLOYED  Tobacco Use   Smoking status: Every Day    Packs/day: 1.00    Years: 53.00  Additional pack years: 0.00    Total pack years: 53.00    Types: Cigarettes    Passive exposure: Never   Smokeless tobacco: Never   Tobacco comments:    Smokes 0.5 packs of cigarettes daily [facility]J 08/22/22  Vaping Use   Vaping Use: Never used  Substance and Sexual Activity   Alcohol use: Yes    Comment: occasional mixed drink   Drug use: No   Sexual activity: Not Currently    Birth control/protection: Surgical    Comment: Hysterectomy  Other Topics Concern   Not on file  Social History Narrative   Not on file   Social Determinants of Health   Financial Resource Strain: Low Risk  (11/14/2021)   Overall Financial Resource Strain (CARDIA)    Difficulty of Paying Living Expenses: Not hard at all  Food Insecurity: No Food Insecurity (11/14/2021)   Hunger Vital Sign    Worried  About Running Out of Food in the Last Year: Never true    Ran Out of Food in the Last Year: Never true  Transportation Needs: No Transportation Needs (11/14/2021)   PRAPARE - Administrator, Civil Service (Medical): No    Lack of Transportation (Non-Medical): No  Physical Activity: Sufficiently Active (11/14/2021)   Exercise Vital Sign    Days of Exercise per Week: 5 days    Minutes of Exercise per Session: 30 min  Stress: No Stress Concern Present (11/14/2021)   [employer] of Occupational Health - Occupational Stress Questionnaire    Feeling of Stress : Not at all  Social Connections: Moderately Integrated (11/14/2021)   Social Connection and Isolation Panel [NHANES]    Frequency of Communication with Friends and Family: Three times a week    Frequency of Social Gatherings with Friends and Family: Three times a week    Attends Religious Services: More than 4 times per year    Active Member of Clubs or Organizations: Yes    Attends Banker Meetings: More than 4 times per year    Marital Status: Widowed  Intimate Partner Violence: Not At Risk (11/14/2021)   Humiliation, Afraid, Rape, and Kick questionnaire    Fear of Current or Ex-Partner: No    Emotionally Abused: No    Physically Abused: No    Sexually Abused: No    She caters and cooks 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 540 mL 1   albuterol (VENTOLIN HFA) 108 (90 Base) MCG/ACT inhaler INHALE 2 PUFFS BY MOUTH EVERY 6 HOURS AS NEEDED FOR WHEEZING FOR SHORTNESS OF BREATH 9 g 1   aspirin EC 81 MG tablet Take 81 mg by mouth daily.     fluticasone (FLONASE) 50 MCG/ACT nasal spray Place  2 sprays into both nostrils daily. 16 g 3   hydrOXYzine (ATARAX) 10 MG tablet Take 1 tablet (10 mg total) by mouth 3 (three) times daily as needed for anxiety. 30 tablet 0   metoprolol tartrate (LOPRESSOR) 25 MG tablet Take 1 tablet (25 mg total) by mouth 2 (two) times daily. 90 tablet 1   rosuvastatin (CRESTOR) 10 MG tablet TAKE 1 TABLET BY MOUTH 5 TIMES A  WEEK ON  MONDAY-FRIDAY 61 tablet 0   Vitamin D, Ergocalciferol, (DRISDOL) 1.25 MG (50000 UNIT) CAPS capsule Take 1 capsule (50,000 Units total) by mouth every 7 (seven) days. 12 capsule 0   cyclobenzaprine (FLEXERIL) 10 MG tablet Take 1 tablet (10 mg total) by mouth 3 (three) times daily as needed. for muscle spams 90 tablet 0   No facility-administered medications prior to visit.        Objective:   Physical Exam Vitals:   08/22/22 1403  BP: 128/74  Pulse: 89  Temp: 98.3 F (36.8 C)  TempSrc: Oral  SpO2: 93%  Weight: 118 lb 9.6 oz (53.8 kg)  Height: 5' (1.524 m)   Gen: Pleasant, thin woman, in no distress,  normal affect  ENT: No lesions,  mouth clear,  oropharynx clear, no postnasal drip  Neck: No JVD, no stridor  Lungs: No use of accessory muscles, distant with a few scattered rhonchi.  No wheezes or crackles  Cardiovascular: RRR, heart sounds normal, no murmur or gallops, no peripheral edema  Musculoskeletal: No deformities, no cyanosis or clubbing  Neuro: alert, awake, non focal  Skin: Warm, no lesions or rash      Assessment & Plan:  COPD (chronic obstructive pulmonary disease) (HCC) And is only on albuterol as needed.  She tells me that she has been on multiple long-acting BD in the past, has not always tolerated.  Wants to avoid starting at this time.  We can revisit going forward once I determine which medication she has tried and failed.  Nicotine dependence Will need to address cessation going forward.  Pulmonary nodule Irregular right lower lobe pulmonary nodule and a high risk patient.  Discussed  with her possible bronchoscopy.  We will perform a PET scan, gauge index of suspicion and if indicated plan for navigational bronchoscopy.  Based on her pulmonary function testing from 2015 I do not think she is a candidate for primary resection.   Levy Pupa, MD, PhD 08/22/2022, 2:37 PM [facility] [phone] or if no answer before 7:00PM call [phone] For any issues after 7:00PM please call eLink [phone]

## 2022-08-22 NOTE — Assessment & Plan Note (Signed)
Irregular right lower lobe pulmonary nodule and a high risk patient.  Discussed with her possible bronchoscopy.  We will perform a PET scan, gauge index of suspicion and if indicated plan for navigational bronchoscopy.  Based on her pulmonary function testing from 2015 I do not think she is a candidate for primary resection.

## 2022-08-22 NOTE — Assessment & Plan Note (Signed)
And is only on albuterol as needed.  She tells me that she has been on multiple long-acting BD in the past, has not always tolerated.  Wants to avoid starting at this time.  We can revisit going forward once I determine which medication she has tried and failed.

## 2022-08-22 NOTE — Assessment & Plan Note (Signed)
Will need to address cessation going forward.

## 2022-08-22 NOTE — Patient Instructions (Signed)
We reviewed your CT scan of the chest today. We will arrange for a PET scan Keep your albuterol available to use either 1 nebulizer treatment or 2 puffs when needed for shortness of breath, chest tightness, wheezing. We will consider starting an every day inhaled medication at some point going forward.  Will hold off for now. Follow Dr. Delton Coombes next available after the PET scan is performed.

## 2022-08-23 DIAGNOSIS — F99 Mental disorder, not otherwise specified: Secondary | ICD-10-CM | POA: Diagnosis not present

## 2022-08-29 ENCOUNTER — Telehealth: Payer: Self-pay | Admitting: Nurse Practitioner

## 2022-08-29 NOTE — Telephone Encounter (Signed)
Pt called and wanted to know have you received the results from her mammogram on 5.20.24

## 2022-08-29 NOTE — Telephone Encounter (Signed)
I called patient to let her know that I will forward message to Lauren and that she may get a call from Dr. Lawana Chambers office for results as well.

## 2022-08-29 NOTE — Telephone Encounter (Signed)
Results have been routed to [facility].

## 2022-08-30 DIAGNOSIS — F99 Mental disorder, not otherwise specified: Secondary | ICD-10-CM | POA: Diagnosis not present

## 2022-08-30 NOTE — Telephone Encounter (Signed)
Results given. Pt expresses understanding. No further questions or concerns.  

## 2022-09-04 ENCOUNTER — Other Ambulatory Visit: Payer: Self-pay | Admitting: Nurse Practitioner

## 2022-09-06 DIAGNOSIS — F99 Mental disorder, not otherwise specified: Secondary | ICD-10-CM | POA: Diagnosis not present

## 2022-09-09 ENCOUNTER — Other Ambulatory Visit: Payer: Self-pay | Admitting: Physician Assistant

## 2022-09-11 ENCOUNTER — Ambulatory Visit (HOSPITAL_COMMUNITY)
Admission: RE | Admit: 2022-09-11 | Discharge: 2022-09-11 | Disposition: A | Discharge status: Home / Self Care | Payer: Medicare Other | Source: Ambulatory Visit | Attending: Emergency Medicine | Admitting: Emergency Medicine

## 2022-09-11 DIAGNOSIS — R911 Solitary pulmonary nodule: Secondary | ICD-10-CM | POA: Insufficient documentation

## 2022-09-11 LAB — GLUCOSE, CAPILLARY: Glucose-Capillary: 82 mg/dL (ref 70–99)

## 2022-09-11 MED ORDER — FLUDEOXYGLUCOSE F - 18 (FDG) INJECTION
5.8000 | Freq: Once | INTRAVENOUS | Status: AC
  Administered 2022-09-11: 5.94 via INTRAVENOUS

## 2022-09-12 ENCOUNTER — Ambulatory Visit (INDEPENDENT_AMBULATORY_CARE_PROVIDER_SITE_OTHER): Payer: Medicare Other | Admitting: Internal Medicine

## 2022-09-12 ENCOUNTER — Encounter: Payer: Self-pay | Admitting: Internal Medicine

## 2022-09-12 VITALS — BP 142/80 | HR 76 | Ht 61.0 in | Wt 121.0 lb

## 2022-09-12 DIAGNOSIS — K5000 Crohn's disease of small intestine without complications: Secondary | ICD-10-CM | POA: Diagnosis not present

## 2022-09-12 DIAGNOSIS — Z85038 Personal history of other malignant neoplasm of large intestine: Secondary | ICD-10-CM | POA: Diagnosis not present

## 2022-09-12 NOTE — Patient Instructions (Signed)
You have been scheduled for a colonoscopy. Please follow written instructions given to you at your visit today.  Please pick up your prep supplies at the pharmacy within the next 1-3 days. If you use inhalers (even only as needed), please bring them with you on the day of your procedure.  _______________________________________________________  If your blood pressure at your visit was 140/90 or greater, please contact your primary care physician to follow up on this.  _______________________________________________________  If you are age 73 or older, your body mass index should be between 23-30. Your Body mass index is 22.86 kg/m. If this is out of the aforementioned range listed, please consider follow up with your Primary Care Provider.  If you are age 22 or younger, your body mass index should be between 19-25. Your Body mass index is 22.86 kg/m. If this is out of the aformentioned range listed, please consider follow up with your Primary Care Provider.   ________________________________________________________  The [name] providers would like to encourage you to use [facility] to communicate with providers for non-urgent requests or questions.  Due to long hold times on the telephone, sending your provider a message by [facility] may be a faster and more efficient way to get a response.  Please allow 48 business hours for a response.  Please remember that this is for non-urgent requests.  _______________________________________________________   I appreciate the opportunity to care for you. Stan Head, MD, [facility]

## 2022-09-12 NOTE — Progress Notes (Signed)
Erica [name] 72 y.o. November 10, 1950 [id number]  Assessment & Plan:   Encounter Diagnoses  Name Primary?   Crohn's disease of small intestine without complication (HCC) Yes   Hx of colon cancer, stage I     1.Schedule colonoscopy for surveillance of Crohn's and hx CRCA  The risks and benefits as well as alternatives of endoscopic procedure(s) have been discussed and reviewed. All questions answered. The patient agrees to proceed.     Subjective:   Chief Complaint: History of Crohn's ileitis and colon cancer  HPI 72 year old white woman with a personal history of colon cancer and Crohn's ileitis, in observation, declines therapy.  Last contact was March 2021 colonoscopy as outlined below. PET scan yesterday to evaluate suspicious lung nodule.  Not yet resulted.  She has symptoms intolerances to dairy, but overall does not have symptoms from her Crohn's disease and continues to decline any therapy.  By Dr. Delton Coombes for COPD and lung nodule.  He does not think she would be an operative candidate based upon her pulmonary parameters.   Wt Readings from Last 3 Encounters:  09/12/22 121 lb (54.9 kg)  08/22/22 118 lb 9.6 oz (53.8 kg)  07/18/22 122 lb (55.3 kg)    Colonoscopy 06/27/19 Impression:               - Patent end-to-side ileo-colonic anastomosis.                           - Crohn's disease with ileitis. Inflammation was                            found.                           - Diverticulosis in the sigmoid colon.                           - The examination was otherwise normal on direct                            and retroflexion views.                           - No specimens collected  Lab Results  Component Value Date   WBC 6.6 06/22/2022   HGB 14.3 06/22/2022   HCT 43.2 06/22/2022   MCV 95.1 06/22/2022   PLT 268.0 06/22/2022    Allergies  Allergen Reactions   Morphine Nausea And Vomiting   Current Meds  Medication Sig   albuterol (PROVENTIL) (2.5  MG/3ML) 0.083% nebulizer solution Take 3 mLs (2.5 mg total) by nebulization every 6 (six) hours as needed for wheezing or shortness of breath.   albuterol (VENTOLIN HFA) 108 (90 Base) MCG/ACT inhaler INHALE 2 PUFFS BY MOUTH EVERY 6 HOURS AS NEEDED FOR WHEEZING FOR SHORTNESS OF BREATH   aspirin EC 81 MG tablet Take 81 mg by mouth daily.   cyclobenzaprine (FLEXERIL) 10 MG tablet Take 1 tablet (10 mg total) by mouth 3 (three) times daily as needed. for muscle spams   fluticasone (FLONASE) 50 MCG/ACT nasal spray Place 2 sprays into both nostrils daily.   hydrOXYzine (ATARAX) 10 MG tablet Take 1 tablet (10 mg total) by mouth 3 (three) times daily as  needed for anxiety.   metoprolol tartrate (LOPRESSOR) 25 MG tablet Take 1 tablet (25 mg total) by mouth 2 (two) times daily. Take 1 tablet by mouth 2 times daily ( Patient needs to schedule ov with new cardiologist for further refills)   rosuvastatin (CRESTOR) 10 MG tablet TAKE 1 TABLET BY MOUTH 5 TIMES A WEEK ON  MONDAY-FRIDAY (Patient taking differently: Take 10 mg by mouth 2 (two) times a week. Patient taking 2 times weekly)   Vitamin D, Ergocalciferol, (DRISDOL) 1.25 MG (50000 UNIT) CAPS capsule Take 1 capsule by mouth once a week   Past Medical History:  Diagnosis Date   Acute duodenitis 04/24/2017   Allergy    ANEMIA    Anxiety    BACK PAIN, LUMBAR    Cancer of ascending colon pTispN0 s/p colectomy 03/05/2009 02/18/2010   Qualifier: Diagnosis of  By: Leone Payor MD, Alfonse Ras E    CHF (congestive heart failure) (HCC)    Complete rotator cuff tear of left shoulder 11/27/2013   Ultrasound guided injection on November 27, 2013    COPD (chronic obstructive pulmonary disease) with emphysema (HCC)    COPD exacerbation (HCC) 11/14/2017   Crohn's 02/2010   ileal ulcers, intol of entercort--refuses treatment   Emphysema of lung (HCC)    GERD    History of blood transfusion    w/ c/s surgery   History of transfusion of whole blood    HYPERLIPIDEMIA     HYPERTENSION    Microscopic hematuria    chronic   Myocardial infarction (HCC) 2010   Narcotic dependence (HCC) 05/25/2017   Obstruction of intestine or colon (HCC)    adhesions   OSTEOARTHRITIS    Rectal fissure    Possible fissure   Rotator cuff tear    Takotsubo syndrome 12/2008   URINARY INCONTINENCE    Past Surgical History:  Procedure Laterality Date   ABDOMINAL HYSTERECTOMY  1994   APPENDECTOMY  1964   BLADDER SUSPENSION     CESAREAN SECTION     x 3   CHOLECYSTECTOMY  1995   COLONOSCOPY  2017   COLONOSCOPY W/ BIOPSIES AND POLYPECTOMY  01/24/2011   (Crohn's)ileitis, internal hemorrhoids   ECTOPIC PREGNANCY SURGERY     ESOPHAGOGASTRODUODENOSCOPY     FACIAL COSMETIC SURGERY     HAND SURGERY Bilateral 1991   x 2   KNEE SURGERY Right 1981   LYSIS OF ADHESION  2010   ex lap/LOA for SBO Dr Johna Sheriff   ORBITAL FRACTURE SURGERY  1990   RIGHT COLECTOMY  03/2009   Right colectomy for colon CA.  Dr Dwain Sarna   TONSILLECTOMY  1952   TOTAL KNEE ARTHROPLASTY  03/2010   Dr Charlann Boxer.  Depuy   UPPER GASTROINTESTINAL ENDOSCOPY  05/16/2010   normal   UTERINE SUSPENSION     VIDEO BRONCHOSCOPY Bilateral 12/18/2014   Procedure: VIDEO BRONCHOSCOPY WITHOUT FLUORO;  Surgeon: Nyoka Cowden, MD;  Location: [facility];  Service: Cardiopulmonary;  Laterality: Bilateral;   Social History   Social History Narrative   Patient is a widow.   Lives in an Apt. 1 son lives in [place] the other 4 adult children live in [place].   Continues to smoke daily, occasional alcohol no drug use   family history includes Arthritis in an other family member; Colon cancer (age of onset: 64) in her sister; Colon cancer (age of onset: 95) in her father; Heart disease in her father; Hypertension in her father; Kidney disease in her father; Liver cancer  in her sister; Other in her sister; Throat cancer in her mother.   Review of Systems As per HPI  Objective:   Physical Exam BP (!) 142/80   Pulse 76    Ht 5\' 1"  (1.549 m)   Wt 121 lb (54.9 kg)   LMP  (LMP Unknown)   BMI 22.86 kg/m  NAD Lungs sl dec BS bilat and some kyphosis Ht NL S1S2 no rmg Abd soft NT Alert and oriented x 3

## 2022-09-13 ENCOUNTER — Telehealth: Payer: Self-pay | Admitting: Emergency Medicine

## 2022-09-13 DIAGNOSIS — F99 Mental disorder, not otherwise specified: Secondary | ICD-10-CM | POA: Diagnosis not present

## 2022-09-13 NOTE — Progress Notes (Signed)
[name] [address] [place] [address] Phone: [phone] Subjective:   Erica [name], am serving as a scribe for Dr. Antoine Primas.  I'm seeing this patient by the request  of:  McElwee, [name], NP  CC: Neck and low back pain follow-up  [id number]  07/18/2022 Patient does not want any injections. Wants to continue with conservative therapy. Discussed potential need for replacement if this continues but she would not want any surgical intervention   Continues to have chronic pain.  Worsening symptoms.  Affecting daily activities.  Patient has this as well as multiple different other modalities that is contributing.  We discussed with patient that she does need to follow-up with other specialist including gastroenterology as well as pulmonology.  Social determinants of health including patient continues to smoke.  Patient continues to worsen over the course of time.  Discussed different treatment options again about physical therapy and medications.  Patient wants to continue with just the Toradol and Depo-Medrol injections but would like to see if we could potentially have more frequent prep.  Discussed with her that I would like to see her again in 2 months.  Encouraged her to follow-up and have the scheduled imaging to see if there is any other reasons for worsening symptoms.  Patient knows also if acutely worsening to seek medical attention immediately.      Update 09/14/2022 Erica [name] is a 72 y.o. female coming in with complaint of neck and lumbar spine pain. Patient states nothing is really touching the pain. Everything is hurting. Feet go numb and now sometimes the same with the finger tips. Patient continues to have pain that affects daily activities.  Unable to do any increasing in activity secondary to the discomfort.    Past Medical History:  Diagnosis Date   Acute duodenitis 04/24/2017   Allergy    ANEMIA    Anxiety     BACK PAIN, LUMBAR    Cancer of ascending colon pTispN0 s/p colectomy 03/05/2009 02/18/2010   Qualifier: Diagnosis of  By: Leone Payor MD, Alfonse Ras E    CHF (congestive heart failure) (HCC)    Complete rotator cuff tear of left shoulder 11/27/2013   Ultrasound guided injection on November 27, 2013    COPD (chronic obstructive pulmonary disease) with emphysema (HCC)    COPD exacerbation (HCC) 11/14/2017   Crohn's 02/2010   ileal ulcers, intol of entercort--refuses treatment   Emphysema of lung (HCC)    GERD    History of blood transfusion    w/ c/s surgery   History of transfusion of whole blood    HYPERLIPIDEMIA    HYPERTENSION    Microscopic hematuria    chronic   Myocardial infarction (HCC) 2010   Narcotic dependence (HCC) 05/25/2017   Obstruction of intestine or colon (HCC)    adhesions   OSTEOARTHRITIS    Rectal fissure    Possible fissure   Rotator cuff tear    Takotsubo syndrome 12/2008   URINARY INCONTINENCE    Past Surgical History:  Procedure Laterality Date   ABDOMINAL HYSTERECTOMY  1994   APPENDECTOMY  1964   BLADDER SUSPENSION     CESAREAN SECTION     x 3   CHOLECYSTECTOMY  1995   COLONOSCOPY  2017   COLONOSCOPY W/ BIOPSIES AND POLYPECTOMY  01/24/2011   (Crohn's)ileitis, internal hemorrhoids   ECTOPIC PREGNANCY SURGERY     ESOPHAGOGASTRODUODENOSCOPY     FACIAL COSMETIC SURGERY  HAND SURGERY Bilateral 1991   x 2   KNEE SURGERY Right 1981   LYSIS OF ADHESION  2010   ex lap/LOA for SBO Dr Johna Sheriff   ORBITAL FRACTURE SURGERY  1990   RIGHT COLECTOMY  03/2009   Right colectomy for colon CA.  Dr Dwain Sarna   TONSILLECTOMY  1952   TOTAL KNEE ARTHROPLASTY  03/2010   Dr Charlann Boxer.  Depuy   UPPER GASTROINTESTINAL ENDOSCOPY  05/16/2010   normal   UTERINE SUSPENSION     VIDEO BRONCHOSCOPY Bilateral 12/18/2014   Procedure: VIDEO BRONCHOSCOPY WITHOUT FLUORO;  Surgeon: Nyoka Cowden, MD;  Location: [facility];  Service: Cardiopulmonary;  Laterality: Bilateral;    Social History   Socioeconomic History   Marital status: Widowed    Spouse name: Not on file   Number of children: 5   Years of education: 14   Highest education level: High school graduate  Occupational History   Occupation: Retired     Associate Professor: UNEMPLOYED  Tobacco Use   Smoking status: Every Day    Packs/day: 1.00    Years: 53.00    Additional pack years: 0.00    Total pack years: 53.00    Types: Cigarettes    Passive exposure: Never   Smokeless tobacco: Never   Tobacco comments:    Smokes 0.5 packs of cigarettes daily ARJ 08/22/22  Vaping Use   Vaping Use: Never used  Substance and Sexual Activity   Alcohol use: Yes    Comment: occasional mixed drink   Drug use: No   Sexual activity: Not Currently    Birth control/protection: Surgical    Comment: Hysterectomy  Other Topics Concern   Not on file  Social History Narrative   Patient is a widow.   Lives in an Apt. 1 son lives in [place] the other 4 adult children live in [place].   Continues to smoke daily, occasional alcohol no drug use   Social Determinants of Health   Financial Resource Strain: Low Risk  (11/14/2021)   Overall Financial Resource Strain (CARDIA)    Difficulty of Paying Living Expenses: Not hard at all  Food Insecurity: No Food Insecurity (11/14/2021)   Hunger Vital Sign    Worried About Running Out of Food in the Last Year: Never true    Ran Out of Food in the Last Year: Never true  Transportation Needs: No Transportation Needs (11/14/2021)   PRAPARE - Administrator, Civil Service (Medical): No    Lack of Transportation (Non-Medical): No  Physical Activity: Sufficiently Active (11/14/2021)   Exercise Vital Sign    Days of Exercise per Week: 5 days    Minutes of Exercise per Session: 30 min  Stress: No Stress Concern Present (11/14/2021)   [employer] of Occupational Health - Occupational Stress Questionnaire    Feeling of Stress : Not at all  Social Connections:  Moderately Integrated (11/14/2021)   Social Connection and Isolation Panel [NHANES]    Frequency of Communication with Friends and Family: Three times a week    Frequency of Social Gatherings with Friends and Family: Three times a week    Attends Religious Services: More than 4 times per year    Active Member of Clubs or Organizations: Yes    Attends Banker Meetings: More than 4 times per year    Marital Status: Widowed   Allergies  Allergen Reactions   Morphine Nausea And Vomiting   Family History  Problem Relation Age of Onset  Throat cancer Mother    Colon cancer Father 62   Hypertension Father    Heart disease Father    Kidney disease Father    Colon cancer Sister 71   Liver cancer Sister    Other Sister        amyloidosis   Arthritis Other        Parent, other relative   Stomach cancer Neg Hx    Rectal cancer Neg Hx    Breast cancer Neg Hx      Current Outpatient Medications (Cardiovascular):    metoprolol tartrate (LOPRESSOR) 25 MG tablet, Take 1 tablet (25 mg total) by mouth 2 (two) times daily. Take 1 tablet by mouth 2 times daily ( Patient needs to schedule ov with new cardiologist for further refills)   rosuvastatin (CRESTOR) 10 MG tablet, TAKE 1 TABLET BY MOUTH 5 TIMES A WEEK ON  MONDAY-FRIDAY (Patient taking differently: Take 10 mg by mouth 2 (two) times a week. Patient taking 2 times weekly)  Current Outpatient Medications (Respiratory):    albuterol (PROVENTIL) (2.5 MG/3ML) 0.083% nebulizer solution, Take 3 mLs (2.5 mg total) by nebulization every 6 (six) hours as needed for wheezing or shortness of breath.   albuterol (VENTOLIN HFA) 108 (90 Base) MCG/ACT inhaler, INHALE 2 PUFFS BY MOUTH EVERY 6 HOURS AS NEEDED FOR WHEEZING FOR SHORTNESS OF BREATH   fluticasone (FLONASE) 50 MCG/ACT nasal spray, Place 2 sprays into both nostrils daily.  Current Outpatient Medications (Analgesics):    aspirin EC 81 MG tablet, Take 81 mg by mouth daily.   Current  Outpatient Medications (Other):    cyclobenzaprine (FLEXERIL) 10 MG tablet, Take 1 tablet (10 mg total) by mouth 3 (three) times daily as needed. for muscle spams   hydrOXYzine (ATARAX) 10 MG tablet, Take 1 tablet (10 mg total) by mouth 3 (three) times daily as needed for anxiety.   Vitamin D, Ergocalciferol, (DRISDOL) 1.25 MG (50000 UNIT) CAPS capsule, Take 1 capsule by mouth once a week   Reviewed prior external information including notes and imaging from  primary care provider As well as notes that were available from care everywhere and other healthcare systems.  Past medical history, social, surgical and family history all reviewed in electronic medical record.  No pertanent information unless stated regarding to the chief complaint.   Review of Systems:  No headache, visual changes, nausea, vomiting, diarrhea, constipation, dizziness, abdominal pain, skin rash, fevers, chills, night sweats, weight loss, swollen lymph nodes,  chest pain, shortness of breath, mood changes. POSITIVE muscle aches, cough, body aches, joint swelling  Objective  Blood pressure 124/82, pulse 73, height 5\' 1"  (1.549 m), weight 120 lb (54.4 kg), SpO2 93 %.   General: No apparent distress alert and oriented x3 mood and affect normal, dressed appropriately.  HEENT: Pupils equal, extraocular movements intact  Respiratory: Patient's speak in full sentences and does not appear short of breath  Cardiovascular: No lower extremity edema, non tender, no erythema  Patient does have arthritic changes noted.  Does have some pain all over.  Patient does have a chronic cough still noted.    Impression and Recommendations:      The above documentation has been reviewed and is accurate and complete Judi Saa, DO

## 2022-09-13 NOTE — Telephone Encounter (Signed)
Patient calling in for PET Scan results. Explained to her we are short staffed and we may not get in contact with her before her appt

## 2022-09-14 ENCOUNTER — Encounter: Payer: Self-pay | Admitting: Family Medicine

## 2022-09-14 ENCOUNTER — Ambulatory Visit (INDEPENDENT_AMBULATORY_CARE_PROVIDER_SITE_OTHER): Payer: Medicare Other | Admitting: Family Medicine

## 2022-09-14 VITALS — BP 124/82 | HR 73 | Ht 61.0 in | Wt 120.0 lb

## 2022-09-14 DIAGNOSIS — M159 Polyosteoarthritis, unspecified: Secondary | ICD-10-CM

## 2022-09-14 DIAGNOSIS — M2550 Pain in unspecified joint: Secondary | ICD-10-CM

## 2022-09-14 MED ORDER — METHYLPREDNISOLONE ACETATE 80 MG/ML IJ SUSP
80.0000 mg | Freq: Once | INTRAMUSCULAR | Status: AC
Start: 2022-09-14 — End: 2022-09-14
  Administered 2022-09-14: 80 mg via INTRAMUSCULAR

## 2022-09-14 MED ORDER — KETOROLAC TROMETHAMINE 60 MG/2ML IM SOLN
60.0000 mg | Freq: Once | INTRAMUSCULAR | Status: AC
Start: 2022-09-14 — End: 2022-09-14
  Administered 2022-09-14: 60 mg via INTRAMUSCULAR

## 2022-09-14 NOTE — Patient Instructions (Addendum)
Dr. Storm Frisk office will call you Injections is all I have for you See me again in 3 months

## 2022-09-14 NOTE — Assessment & Plan Note (Signed)
Significant osteoarthritic changes of multiple joints.  Pain is fairly severe.  Patient's stress is also significantly up secondary to wanting to know more about her pulmonary nodule.  Patient is following up with pulmonology in the near future.  Toradol and Depo-Medrol injections given today.  Referred patient to pain management to see if she would be potentially a candidate from naltrexone.  Has been on chronic pain medications previously but has not been on narcotics for some time now.  Can follow-up with me again in 2 to 3 months if she would like Not changing management significantly.

## 2022-09-15 ENCOUNTER — Ambulatory Visit (INDEPENDENT_AMBULATORY_CARE_PROVIDER_SITE_OTHER): Payer: Medicare Other | Admitting: Emergency Medicine

## 2022-09-15 ENCOUNTER — Encounter: Payer: Self-pay | Admitting: Emergency Medicine

## 2022-09-15 VITALS — BP 126/72 | HR 74 | Ht 61.0 in | Wt 122.0 lb

## 2022-09-15 DIAGNOSIS — R911 Solitary pulmonary nodule: Secondary | ICD-10-CM

## 2022-09-15 NOTE — Patient Instructions (Signed)
We reviewed your PET scan today. We will plan to repeat a CT scan of the chest in September 2024.   Follow Dr. Delton Coombes in September after the CT so we can review the results together.  Depending on those results we will decide whether any other workup is necessary including a possible bronchoscopy.

## 2022-09-15 NOTE — Assessment & Plan Note (Signed)
I reviewed her PET scan.  The official read is not yet back but it looks like there is some very subtle hypermetabolism associated with the right lower lobe nodule.  That said the nodule appears to be slightly smaller than it was on the screening CT just 1 month prior.  Discussed this with her today.  I think will be reasonable to plan to continue to follow serial imaging.  Next CT chest in 3 months.  If there is an interval increase in size or if there is increase suspicion then we will plan for navigational bronchoscopy.  I do not think she is a surgical candidate for primary resection.

## 2022-09-15 NOTE — Telephone Encounter (Signed)
Closing encounter since patient had f/u apt today

## 2022-09-15 NOTE — Progress Notes (Signed)
Subjective:    Patient ID: Erica [name], female    DOB: [birth year], 72 y.o.   MRN: [id number]  HPI 72 year old woman, active smoker (53 pack years, smokes 1 pack daily), carries a diagnosis of COPD.  She has hypertension, hyperlipidemia, remote history of colon cancer (2010), osteoarthritis, crohn's disease.  She has been seen in our office in the past by Dr. Sherene Sires.  She has participated in the lung cancer screening program, most recent scan done on 08/08/2022.  She is here today to discuss that scan. She is on albuterol - has nebs that she uses every several days. She will have dyspnea with stairs especially when carrying groceries. Minimal cough. She does get congestion and allergies. She can occasionally hear wheeze.    Lung cancer screening CT scan of the chest 08/08/2022 reviewed by me shows centrilobular emphysema and evidence for respiratory bronchiolitis, multiple small stable pulmonary nodules, a new 14 mm spiculated medial right lower lobe nodule concerning for malignancy.  Pulmonary function testing 07/18/2013 reviewed by me showed severe obstruction without a bronchodilator response.  FEV1 1.35 L (58% predicted), lung volumes hyperinflated, decreased diffusion capacity.  ROV 09/15/2022 --follow-up visit for 72 year old woman with a history of tobacco use, COPD, hypertension, remote colon cancer, Crohn's disease.  I saw her in late May to review her lung cancer screening CT chest.  This showed multiple stable small nodules but a new 14 mm spiculated medial RLL nodule concerning for possible malignancy.  Her PFT likely preclude surgical candidacy  PET scan performed 09/11/2022 reviewed by me, shows that the RLL nodule may be slightly smaller. There is mild hypermetabolism associated with the nodule. No other concerning findings    Review of Systems As per HPI  Past Medical History:  Diagnosis Date   Acute duodenitis 04/24/2017   Allergy    ANEMIA    Anxiety    BACK PAIN, LUMBAR     Cancer of ascending colon pTispN0 s/p colectomy 03/05/2009 02/18/2010   Qualifier: Diagnosis of  By: Leone Payor MD, Alfonse Ras E    CHF (congestive heart failure) (HCC)    Complete rotator cuff tear of left shoulder 11/27/2013   Ultrasound guided injection on November 27, 2013    COPD (chronic obstructive pulmonary disease) with emphysema (HCC)    COPD exacerbation (HCC) 11/14/2017   Crohn's 02/2010   ileal ulcers, intol of entercort--refuses treatment   Emphysema of lung (HCC)    GERD    History of blood transfusion    w/ c/s surgery   History of transfusion of whole blood    HYPERLIPIDEMIA    HYPERTENSION    Microscopic hematuria    chronic   Myocardial infarction (HCC) 2010   Narcotic dependence (HCC) 05/25/2017   Obstruction of intestine or colon (HCC)    adhesions   OSTEOARTHRITIS    Rectal fissure    Possible fissure   Rotator cuff tear    Takotsubo syndrome 12/2008   URINARY INCONTINENCE      Family History  Problem Relation Age of Onset   Throat cancer Mother    Colon cancer Father 40   Hypertension Father    Heart disease Father    Kidney disease Father    Colon cancer Sister 46   Liver cancer Sister    Other Sister        amyloidosis   Arthritis Other        Parent, other relative   Stomach cancer Neg Hx    Rectal cancer  Neg Hx    Breast cancer Neg Hx     No family hx lung CA  Social History   Socioeconomic History   Marital status: Widowed    Spouse name: Not on file   Number of children: 5   Years of education: 14   Highest education level: High school graduate  Occupational History   Occupation: Retired     Associate Professor: UNEMPLOYED  Tobacco Use   Smoking status: Every Day    Packs/day: 1.00    Years: 53.00    Additional pack years: 0.00    Total pack years: 53.00    Types: Cigarettes    Passive exposure: Never   Smokeless tobacco: Never   Tobacco comments:    Smokes 0.5 packs of cigarettes daily ARJ 08/22/22  Vaping Use   Vaping Use: Never  used  Substance and Sexual Activity   Alcohol use: Yes    Comment: occasional mixed drink   Drug use: No   Sexual activity: Not Currently    Birth control/protection: Surgical    Comment: Hysterectomy  Other Topics Concern   Not on file  Social History Narrative   Patient is a widow.   Lives in an Apt. 1 son lives in [place] the other 4 adult children live in [place].   Continues to smoke daily, occasional alcohol no drug use   Social Determinants of Health   Financial Resource Strain: Low Risk  (11/14/2021)   Overall Financial Resource Strain (CARDIA)    Difficulty of Paying Living Expenses: Not hard at all  Food Insecurity: No Food Insecurity (11/14/2021)   Hunger Vital Sign    Worried About Running Out of Food in the Last Year: Never true    Ran Out of Food in the Last Year: Never true  Transportation Needs: No Transportation Needs (11/14/2021)   PRAPARE - Administrator, Civil Service (Medical): No    Lack of Transportation (Non-Medical): No  Physical Activity: Sufficiently Active (11/14/2021)   Exercise Vital Sign    Days of Exercise per Week: 5 days    Minutes of Exercise per Session: 30 min  Stress: No Stress Concern Present (11/14/2021)   [employer] of Occupational Health - Occupational Stress Questionnaire    Feeling of Stress : Not at all  Social Connections: Moderately Integrated (11/14/2021)   Social Connection and Isolation Panel [NHANES]    Frequency of Communication with Friends and Family: Three times a week    Frequency of Social Gatherings with Friends and Family: Three times a week    Attends Religious Services: More than 4 times per year    Active Member of Clubs or Organizations: Yes    Attends Banker Meetings: More than 4 times per year    Marital Status: Widowed  Intimate Partner Violence: Not At Risk (11/14/2021)   Humiliation, Afraid, Rape, and Kick questionnaire    Fear of Current or Ex-Partner: No    Emotionally  Abused: No    Physically Abused: No    Sexually Abused: No    She caters and cooks 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 540 mL 1  albuterol (VENTOLIN HFA) 108 (90 Base) MCG/ACT inhaler INHALE 2 PUFFS BY MOUTH EVERY 6 HOURS AS NEEDED FOR WHEEZING FOR SHORTNESS OF BREATH 9 g 1   aspirin EC 81 MG tablet Take 81 mg by mouth daily.     cyclobenzaprine (FLEXERIL) 10 MG tablet Take 1 tablet (10 mg total) by mouth 3 (three) times daily as needed. for muscle spams (Patient taking differently: Take 10 mg by mouth daily as needed for muscle spasms. for muscle spams) 90 tablet 0   fluticasone (FLONASE) 50 MCG/ACT nasal spray Place 2 sprays into both nostrils daily. 16 g 3   hydrOXYzine (ATARAX) 10 MG tablet Take 1 tablet (10 mg total) by mouth 3 (three) times daily as needed for anxiety. 30 tablet 0   metoprolol tartrate (LOPRESSOR) 25 MG tablet Take 1 tablet (25 mg total) by mouth 2 (two) times daily. Take 1 tablet by mouth 2 times daily ( Patient needs to schedule ov with new cardiologist for further refills) 90 tablet 0   rosuvastatin (CRESTOR) 10 MG tablet TAKE 1 TABLET BY MOUTH 5 TIMES A WEEK ON  MONDAY-FRIDAY (Patient taking differently: Take 10 mg by mouth 2 (two) times a week. Patient takes 10mg  on Mondays and 10mg  on Fridays.) 61 tablet 0   Vitamin D, Ergocalciferol, (DRISDOL) 1.25 MG (50000 UNIT) CAPS capsule Take 1 capsule by mouth once a week 12 capsule 0   No facility-administered medications prior to visit.        Objective:   Physical Exam Vitals:   09/15/22 1053  BP: 126/72  Pulse: 74  SpO2: 96%  Weight: 122 lb (55.3 kg)  Height: 5\' 1"  (1.549 m)   Gen:  Pleasant, thin woman, in no distress,  normal affect  ENT: No lesions,  mouth clear,  oropharynx clear, no postnasal drip  Neck: No JVD, no stridor  Lungs: No use of accessory muscles, distant with a few scattered rhonchi.  No wheezes or crackles  Cardiovascular: RRR, heart sounds normal, no murmur or gallops, no peripheral edema  Musculoskeletal: No deformities, no cyanosis or clubbing  Neuro: alert, awake, non focal  Skin: Warm, no lesions or rash      Assessment & Plan:  Pulmonary nodule I reviewed her PET scan.  The official read is not yet back but it looks like there is some very subtle hypermetabolism associated with the right lower lobe nodule.  That said the nodule appears to be slightly smaller than it was on the screening CT just 1 month prior.  Discussed this with her today.  I think will be reasonable to plan to continue to follow serial imaging.  Next CT chest in 3 months.  If there is an interval increase in size or if there is increase suspicion then we will plan for navigational bronchoscopy.  I do not think she is a surgical candidate for primary resection.   Levy Pupa, MD, PhD 09/15/2022, 11:34 AM [facility] [phone] or if no answer before 7:00PM call [phone] For any issues after 7:00PM please call eLink [phone]

## 2022-09-20 ENCOUNTER — Other Ambulatory Visit: Payer: Self-pay | Admitting: Nurse Practitioner

## 2022-09-20 DIAGNOSIS — F99 Mental disorder, not otherwise specified: Secondary | ICD-10-CM | POA: Diagnosis not present

## 2022-09-20 NOTE — Telephone Encounter (Signed)
Requesting: Albuterol Sulfate HFA 108 (90 Base) MCG/ACT Inhalation Aerosol Solution  Last Visit: 06/22/2022 Next Visit: 12/25/2022 Last Refill: 08/01/2022  Please Advise

## 2022-09-27 DIAGNOSIS — F99 Mental disorder, not otherwise specified: Secondary | ICD-10-CM | POA: Diagnosis not present

## 2022-09-29 ENCOUNTER — Telehealth: Payer: Self-pay | Admitting: Internal Medicine

## 2022-09-29 NOTE — Telephone Encounter (Signed)
Patient's questions answered about her diet and medicine to take for her upcoming procedure.

## 2022-09-29 NOTE — Telephone Encounter (Signed)
Inbound call from patient stating she has some questions regarding her medications she can take before her colonoscopy scheduled for 7/3. Requesting a call back to discuss and to be advised. Please advise, thank you.

## 2022-10-04 ENCOUNTER — Ambulatory Visit (AMBULATORY_SURGERY_CENTER): Payer: Medicare Other | Admitting: Internal Medicine

## 2022-10-04 ENCOUNTER — Encounter: Payer: Self-pay | Admitting: Internal Medicine

## 2022-10-04 VITALS — BP 123/64 | HR 70 | Temp 98.9°F | Resp 22 | Ht 61.0 in | Wt 121.0 lb

## 2022-10-04 DIAGNOSIS — D128 Benign neoplasm of rectum: Secondary | ICD-10-CM | POA: Diagnosis not present

## 2022-10-04 DIAGNOSIS — Z85038 Personal history of other malignant neoplasm of large intestine: Secondary | ICD-10-CM

## 2022-10-04 DIAGNOSIS — Z08 Encounter for follow-up examination after completed treatment for malignant neoplasm: Secondary | ICD-10-CM

## 2022-10-04 DIAGNOSIS — K5000 Crohn's disease of small intestine without complications: Secondary | ICD-10-CM

## 2022-10-04 DIAGNOSIS — K621 Rectal polyp: Secondary | ICD-10-CM | POA: Diagnosis not present

## 2022-10-04 MED ORDER — SODIUM CHLORIDE 0.9 % IV SOLN
500.0000 mL | Freq: Once | INTRAVENOUS | Status: DC
Start: 2022-10-04 — End: 2022-10-04

## 2022-10-04 NOTE — Progress Notes (Signed)
Sedate, gd SR, tolerated procedure well, VSS, report to RN 

## 2022-10-04 NOTE — Op Note (Signed)
[facility] Patient Name: Erica [name] Procedure Date: 10/04/2022 2:42 PM MRN: [id number] Endoscopist: Iva Boop , MD, [id number] Age: 72 Referring MD:  Date of Birth: [birth year] Gender: Female Account #: [id number] Procedure:                Colonoscopy Indications:              High risk colon cancer surveillance: Personal                            history of colon cancer, Last colonoscopy: 2021 Medicines:                Monitored Anesthesia Care Procedure:                Pre-Anesthesia Assessment:                           - Prior to the procedure, a History and Physical                            was performed, and patient medications and                            allergies were reviewed. The patient's tolerance of                            previous anesthesia was also reviewed. The risks                            and benefits of the procedure and the sedation                            options and risks were discussed with the patient.                            All questions were answered, and informed consent                            was obtained. Prior Anticoagulants: The patient has                            taken no anticoagulant or antiplatelet agents. ASA                            Grade Assessment: III - A patient with severe                            systemic disease. After reviewing the risks and                            benefits, the patient was deemed in satisfactory                            condition to undergo the procedure.  After obtaining informed consent, the colonoscope                            was passed under direct vision. Throughout the                            procedure, the patient's blood pressure, pulse, and                            oxygen saturations were monitored continuously. The                            Olympus PCF-H190DL (#[id number]) Colonoscope was                            introduced  through the anus and advanced to the the                            terminal ileum. The colonoscopy was performed                            without difficulty. The patient tolerated the                            procedure well. The quality of the bowel                            preparation was good. The terminal ileum, the                            rectum and Ileocolonic anastomsis areas were                            photographed. Scope In: 2:48:36 PM Scope Out: 3:00:05 PM Scope Withdrawal Time: 0 hours 7 minutes 43 seconds  Total Procedure Duration: 0 hours 11 minutes 29 seconds  Findings:                 Hemorrhoids were found on perianal exam.                           Two sessile polyps were found in the rectum. The                            polyps were diminutive in size. These polyps were                            removed with a cold snare. Resection and retrieval                            were complete. Verification of patient                            identification for the specimen was done. Estimated  blood loss was minimal.                           Multiple diverticula were found in the sigmoid                            colon.                           There was evidence of a prior end-to-side                            ileo-colonic anastomosis in the transverse colon.                            This was patent. The anastomosis was traversed.                           Diffuse inflammation characterized by congestion                            (edema), erosions, erythema and friability was                            found in the neo-terminal Ileum. The inflammation                            was mild in severity.                           The exam was otherwise without abnormality on                            direct and retroflexion views. Complications:            No immediate complications. Estimated Blood Loss:     Estimated blood loss  was minimal. Impression:               - Hemorrhoids found on perianal exam.                           - Two diminutive polyps in the rectum, removed with                            a cold snare. Resected and retrieved.                           - Diverticulosis in the sigmoid colon.                           - Patent end-to-side ileo-colonic anastomosis.                           - Crohn's disease with ileitis. Inflammation was                            found. This  was mild in severity.                           - The examination was otherwise normal on direct                            and retroflexion views. Recommendation:           - Patient has a contact number available for                            emergencies. The signs and symptoms of potential                            delayed complications were discussed with the                            patient. Return to normal activities tomorrow.                            Written discharge instructions were provided to the                            patient.                           - Resume previous diet.                           - Continue present medications.                           - Await pathology results.                           - Repeat colonoscopy is recommended. The                            colonoscopy date will be determined after pathology                            results from today's exam become available for                            review. Iva Boop, MD 10/04/2022 3:09:43 PM This report has been signed electronically.

## 2022-10-04 NOTE — Progress Notes (Signed)
Called to room to assist during endoscopic procedure.  Patient ID and intended procedure confirmed with present staff. Received instructions for my participation in the procedure from the performing physician.  

## 2022-10-04 NOTE — Progress Notes (Signed)
History and Physical Interval Note:  10/04/2022 2:39 PM  Erica [name]  has presented today for endoscopic procedure(s), with the diagnosis of  Encounter Diagnosis  Name Primary?   Crohn's disease of small intestine without complication (HCC) Yes  .  The various methods of evaluation and treatment have been discussed with the patient and/or family. After consideration of risks, benefits and other options for treatment, the patient has consented to  the endoscopic procedure(s).   The patient's history has been reviewed, patient examined, no change in status, stable for endoscopic procedure(s).  I have reviewed the patient's chart and labs.  Questions were answered to the patient's satisfaction.     Iva Boop, MD, [facility]

## 2022-10-04 NOTE — Patient Instructions (Addendum)
I found and removed 2 tiny polyps - also still have diverticulosis and the Crohn's.  Will have the polyps checked - sure they are benign  I appreciate the opportunity to care for you. Iva Boop, MD, [facility]  Resume previous diet Continue present medications Await pathology results  Handouts/information given for polyps, diverticulosis and hemorrhoids  YOU HAD AN ENDOSCOPIC PROCEDURE TODAY AT THE [facility]:   Refer to the procedure report that was given to you for any specific questions about what was found during the examination.  If the procedure report does not answer your questions, please call your gastroenterologist to clarify.  If you requested that your care partner not be given the details of your procedure findings, then the procedure report has been included in a sealed envelope for you to review at your convenience later.  YOU SHOULD EXPECT: Some feelings of bloating in the abdomen. Passage of more gas than usual.  Walking can help get rid of the air that was put into your GI tract during the procedure and reduce the bloating. If you had a lower endoscopy (such as a colonoscopy or flexible sigmoidoscopy) you may notice spotting of blood in your stool or on the toilet paper. If you underwent a bowel prep for your procedure, you may not have a normal bowel movement for a few days.  Please Note:  You might notice some irritation and congestion in your nose or some drainage.  This is from the oxygen used during your procedure.  There is no need for concern and it should clear up in a day or so.  SYMPTOMS TO REPORT IMMEDIATELY:  Following lower endoscopy (colonoscopy):  Excessive amounts of blood in the stool  Significant tenderness or worsening of abdominal pains  Swelling of the abdomen that is new, acute  Fever of 100F or higher  For urgent or emergent issues, a gastroenterologist can be reached at any hour by calling [phone]. Do not use MyChart  messaging for urgent concerns.   DIET:  We do recommend a small meal at first, but then you may proceed to your regular diet.  Drink plenty of fluids but you should avoid alcoholic beverages for 24 hours.  ACTIVITY:  You should plan to take it easy for the rest of today and you should NOT DRIVE or use heavy machinery until tomorrow (because of the sedation medicines used during the test).    FOLLOW UP: Our staff will call the number listed on your records the next business day following your procedure.  We will call around 7:15- 8:00 am to check on you and address any questions or concerns that you may have regarding the information given to you following your procedure. If we do not reach you, we will leave a message.     If any biopsies were taken you will be contacted by phone or by letter within the next 1-3 weeks.  Please call us at [phone] if you have not heard about the biopsies in 3 weeks.    SIGNATURES/CONFIDENTIALITY: You and/or your care partner have signed paperwork which will be entered into your electronic medical record.  These signatures attest to the fact that that the information above on your After Visit Summary has been reviewed and is understood.  Full responsibility of the confidentiality of this discharge information lies with you and/or your care-partner.

## 2022-10-04 NOTE — Progress Notes (Signed)
Pt's states no medical or surgical changes since previsit or office visit. 

## 2022-10-06 ENCOUNTER — Telehealth: Payer: Self-pay

## 2022-10-06 NOTE — Telephone Encounter (Signed)
[facility]  Follow up Call-     10/04/2022    1:40 PM  Call back number  Post procedure Call Back phone  # [phone]  Permission to leave phone message Yes     Patient questions:  Do you have a fever, pain , or abdominal swelling? No. Pain Score  0 *  Have you tolerated food without any problems? Yes.    Have you been able to return to your normal activities? Yes.    Do you have any questions about your discharge instructions: Diet   No. Medications  No. Follow up visit  No.  Do you have questions or concerns about your Care? No.  Actions: * If pain score is 4 or above: No action needed, pain <4.

## 2022-10-12 ENCOUNTER — Encounter: Payer: Self-pay | Admitting: Physical Medicine and Rehabilitation

## 2022-10-14 ENCOUNTER — Other Ambulatory Visit: Payer: Self-pay | Admitting: Nurse Practitioner

## 2022-10-16 ENCOUNTER — Encounter: Payer: Self-pay | Admitting: Internal Medicine

## 2022-10-17 ENCOUNTER — Telehealth: Payer: Self-pay | Admitting: Nurse Practitioner

## 2022-10-17 NOTE — Telephone Encounter (Signed)
Requesting: Albuterol HFA Last Visit: 06/22/2022 Next Visit: 10/17/2022 Last Refill: 09/20/2022  Please Advise    I was going to refill and had a pop-up from pharmacy about the cost and days supply change

## 2022-10-17 NOTE — Telephone Encounter (Signed)
Patient notified that refill request approved and sent to pharmacy.

## 2022-10-17 NOTE — Telephone Encounter (Signed)
Prescription Request  10/17/2022  LOV: 06/22/2022  What is the name of the medication or equipment? albuterol (VENTOLIN HFA) 108 (90 Base) MCG/ACT inhaler [[id number]]   Have you contacted your pharmacy to request a refill? Yes   Which pharmacy would you like this sent to?  [facility] [address] [place], [place] - [address] [place] [address] Phone: [phone] Fax: [phone]    Patient notified that their request is being sent to the clinical staff for review and that they should receive a response within 2 business days.   Please advise at Mobile [phone] (mobile)   Pt want know couple refills at a time . Please give the pt a call

## 2022-10-18 DIAGNOSIS — F99 Mental disorder, not otherwise specified: Secondary | ICD-10-CM | POA: Diagnosis not present

## 2022-10-22 ENCOUNTER — Other Ambulatory Visit: Payer: Self-pay | Admitting: Cardiology

## 2022-10-25 DIAGNOSIS — F99 Mental disorder, not otherwise specified: Secondary | ICD-10-CM | POA: Diagnosis not present

## 2022-11-01 DIAGNOSIS — F99 Mental disorder, not otherwise specified: Secondary | ICD-10-CM | POA: Diagnosis not present

## 2022-11-08 DIAGNOSIS — F99 Mental disorder, not otherwise specified: Secondary | ICD-10-CM | POA: Diagnosis not present

## 2022-11-15 DIAGNOSIS — F99 Mental disorder, not otherwise specified: Secondary | ICD-10-CM | POA: Diagnosis not present

## 2022-11-20 ENCOUNTER — Encounter
Payer: Medicare Other | Attending: Physical Medicine and Rehabilitation | Admitting: Physical Medicine and Rehabilitation

## 2022-11-20 ENCOUNTER — Other Ambulatory Visit: Payer: Self-pay | Admitting: Physician Assistant

## 2022-11-20 ENCOUNTER — Other Ambulatory Visit: Payer: Self-pay | Admitting: Nurse Practitioner

## 2022-11-20 VITALS — BP 146/86 | HR 82 | Ht 61.0 in | Wt 122.0 lb

## 2022-11-20 DIAGNOSIS — G8929 Other chronic pain: Secondary | ICD-10-CM | POA: Diagnosis not present

## 2022-11-20 DIAGNOSIS — M5010 Cervical disc disorder with radiculopathy, unspecified cervical region: Secondary | ICD-10-CM | POA: Diagnosis not present

## 2022-11-20 DIAGNOSIS — M158 Other polyosteoarthritis: Secondary | ICD-10-CM | POA: Diagnosis not present

## 2022-11-20 DIAGNOSIS — M5416 Radiculopathy, lumbar region: Secondary | ICD-10-CM | POA: Diagnosis not present

## 2022-11-20 DIAGNOSIS — Z79899 Other long term (current) drug therapy: Secondary | ICD-10-CM | POA: Diagnosis not present

## 2022-11-20 DIAGNOSIS — G894 Chronic pain syndrome: Secondary | ICD-10-CM | POA: Diagnosis not present

## 2022-11-20 NOTE — Patient Instructions (Signed)
Today, we discussed multiple options for pain control, including return to physical therapy, returning to Dr. Rubye Oaks to try medial branch blocks, and starting low-dose naltrexone.  While we have not opted to do any of these interventions at this time, I will let Dr. Katrinka Blazing know about our discussion and you can follow-up with him regarding returning to Dr. Rubye Oaks.  Today, we are getting a urine drug screen and filling out a pain contract.  I will also need you to fill out a records request form from Dr. Frutoso Chase office so we can get your last visit appointment and lab records with her.  I will call you once we have these items together, and discuss whether or not you would be appropriate for long-term opiate pain management.  This is not a guarantee of medication prescription  Continue your home exercises, heat, and Tylenol as needed.  I will not schedule a follow-up with you until we have an opportunity to talk over the phone in approximately 1 week.

## 2022-11-20 NOTE — Progress Notes (Signed)
Subjective:    Patient ID: Erica [name], female    DOB: 1950-07-11, 72 y.o.   MRN: [id number]  HPI HPI  Erica [name] is a 72 y.o. year old female  who  has a past medical history of Acute duodenitis (04/24/2017), Allergy, ANEMIA, Anxiety, BACK PAIN, LUMBAR, Cancer of ascending colon pTispN0 s/p colectomy 03/05/2009 (02/18/2010), CHF (congestive heart failure) (HCC), Complete rotator cuff tear of left shoulder (11/27/2013), COPD (chronic obstructive pulmonary disease) with emphysema (HCC), COPD exacerbation (HCC) (11/14/2017), Crohn's (02/2010), Emphysema of lung (HCC), GERD, History of blood transfusion, History of transfusion of whole blood, HYPERLIPIDEMIA, HYPERTENSION, Microscopic hematuria, Myocardial infarction (HCC) (2010), Narcotic dependence (HCC) (05/25/2017), Obstruction of intestine or colon (HCC), OSTEOARTHRITIS, Rectal fissure, Rotator cuff tear, Takotsubo syndrome (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   They are presenting to PM&R clinic as a new patient for pain management evaluation. They were referred by Dr. Katrinka Blazing for treatment of lumbar pain and polyarthralgia.   Per his last note: Significant osteoarthritic changes of multiple joints. Pain is fairly severe... Toradol and Depo-Medrol injections given today. Referred patient to pain management to see if she would be potentially a candidate from naltrexone. Has been on chronic pain medications previously but has not been on narcotics for some time now. Can follow-up with me again in 2 to 3 months if she would like Not changing management significantly.   Source: "All over the back", mostly her low back but also cervical. Also has R rotator cuff tear.   Inciting incident: none 40 years ago had a rock climbing accident where she was trying to rescue another climber and was swung into a wall. She worked at [employer] and at that time recommended surgery; she refused and moved.    Description of pain:  Started in her low back and goes down her legs bilaterally, burning and aching  Exacerbating factors: bending forward, laying flat, sitting, and rest Remitting factors: pain medication Red flag symptoms: No red flags for back pain endorsed in Hx or ROS  Medications tried: Topical medications (mild effect) : Used specialty compounded cream through Dr. Katrinka Blazing Nsaids ( cannot take ) : Avoids NSAIDs; was told to not take, "it was the same thing where they  Tylenol  (mild effect) : Takes 2 tabs in the morning and 3 at nighttime.   Opiates  (moderate effect) : Has been on tramadol and oxycodone in the past, had  ?stomach issues with Tramadol.   Was getting Norco 5 mg TID with Dr. Hillard Danker through 10/21/21. She stopped because she was switching doctors and "I called for my medication a day early and they said I needed to come in before I could get more, even though I had an appointment the 29th and it was the 20th. So I stopped them because, what, they think I'm selling my medication? I'm not going to deal with that." It "took the edge off my pain" and she feels like overall was beneficial.   Gabapentin / Lyrica  (unsure of effect) : Was on gabapentin in the past, caused a side effect, unsure what. Never tried lyrica.   TCAs  (never tried) :   SNRIs  (never tried) :   Other  (mild effect) : Has hot packs that she uses. Takes cyclobenzaprine 10 mg and takes rarely 1 pill every 2-3 months, avoids because it makes her tired and "I don't like the feeling".   Other treatments: PT/OT  (no effect) : Tried in the past multiple  times, most recently in 2021 after her knee replacement, but usually makes her pain worse.  Accupuncture/chiropractor/massage  (never tried) :  TENs unit (no effect) :  Injections (moderate effect) : Was getting epidural injections by Dr. Rubye Oaks in her low back; he stopped because she was needing them closer together.   With Dr. Katrinka Blazing, gets Depo-Medrol injections and  Toradol injections which helps, gets every 3 months and can do every 2 months if needed.   Surgery (never tried) : No, she has declined and then was told she was not a candidate.   Goals for pain control: "I would just like to be able to go up and down stairs, walk, clean in my house with less pain". 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   Recently moved out of a large home, but has to go up 19 steps to get in/out of her apartment, which is very tough. She has a good support system with family living nearby and neighbors who help.   Prior UDS results: No results found for: "LABOPIA", "COCAINSCRNUR", "LABBENZ", "AMPHETMU", "THCU", "LABBARB"     Pain Inventory Average Pain 8 Pain Right Now 8 My pain is constant and tingling  In the last 24 hours, has pain interfered with the following? General activity 0 Relation with others 0 Enjoyment of life 0 What TIME of day is your pain at its worst? varies Sleep (in general) Fair  Pain is worse with: walking, bending, standing, some activites, and getting up out of chair  Pain improves with: medication Relief from Meds: 4  use a cane  disabled: date disabled 2014  spasms  New pt  New pt    Family History  Problem Relation Age of Onset   Throat cancer Mother    Colon cancer Father 89   Hypertension Father    Heart disease Father    Kidney disease Father    Colon cancer Sister 58   Liver cancer Sister    Other Sister        amyloidosis   Arthritis Other        Parent, other relative   Stomach cancer Neg Hx    Rectal cancer Neg Hx    Breast cancer Neg Hx    Social History   Socioeconomic History   Marital status: Widowed    Spouse name: Not on file   Number of children: 5   Years of education: 14   Highest education level: High school graduate  Occupational History   Occupation: Retired     Associate Professor: UNEMPLOYED  Tobacco Use    Smoking status: Every Day    Current packs/day: 1.00    Average packs/day: 1 pack/day for 53.0 years (53.0 ttl pk-yrs)    Types: Cigarettes    Passive exposure: Never   Smokeless tobacco: Never   Tobacco comments:    Smokes 0.5 packs of cigarettes daily ARJ 08/22/22  Vaping Use   Vaping status: Never Used  Substance and Sexual Activity   Alcohol use: Yes    Comment: occasional mixed drink   Drug use: No   Sexual activity: Not Currently    Birth control/protection: Surgical    Comment: Hysterectomy  Other Topics Concern   Not on file  Social History Narrative   Patient is a widow.   Lives in an Apt. 1 son lives in [place] the other 4 adult children live in [place].  Continues to smoke daily, occasional alcohol no drug use   Social Determinants of Health   Financial Resource Strain: Low Risk  (11/14/2021)   Overall Financial Resource Strain (CARDIA)    Difficulty of Paying Living Expenses: Not hard at all  Food Insecurity: No Food Insecurity (11/14/2021)   Hunger Vital Sign    Worried About Running Out of Food in the Last Year: Never true    Ran Out of Food in the Last Year: Never true  Transportation Needs: No Transportation Needs (11/14/2021)   PRAPARE - Administrator, Civil Service (Medical): No    Lack of Transportation (Non-Medical): No  Physical Activity: Sufficiently Active (11/14/2021)   Exercise Vital Sign    Days of Exercise per Week: 5 days    Minutes of Exercise per Session: 30 min  Stress: No Stress Concern Present (11/14/2021)   [employer] of Occupational Health - Occupational Stress Questionnaire    Feeling of Stress : Not at all  Social Connections: Moderately Integrated (11/14/2021)   Social Connection and Isolation Panel [NHANES]    Frequency of Communication with Friends and Family: Three times a week    Frequency of Social Gatherings with Friends and Family: Three times a week    Attends Religious Services: More than 4 times per year     Active Member of Clubs or Organizations: Yes    Attends Banker Meetings: More than 4 times per year    Marital Status: Widowed   Past Surgical History:  Procedure Laterality Date   ABDOMINAL HYSTERECTOMY  1994   APPENDECTOMY  1964   BLADDER SUSPENSION     CESAREAN SECTION     x 3   CHOLECYSTECTOMY  1995   COLONOSCOPY  2017   COLONOSCOPY W/ BIOPSIES AND POLYPECTOMY  01/24/2011   (Crohn's)ileitis, internal hemorrhoids   ECTOPIC PREGNANCY SURGERY     ESOPHAGOGASTRODUODENOSCOPY     FACIAL COSMETIC SURGERY     HAND SURGERY Bilateral 1991   x 2   KNEE SURGERY Right 1981   LYSIS OF ADHESION  2010   ex lap/LOA for SBO Dr Johna Sheriff   ORBITAL FRACTURE SURGERY  1990   RIGHT COLECTOMY  03/2009   Right colectomy for colon CA.  Dr Dwain Sarna   TONSILLECTOMY  1952   TOTAL KNEE ARTHROPLASTY  03/2010   Dr Charlann Boxer.  Depuy   UPPER GASTROINTESTINAL ENDOSCOPY  05/16/2010   normal   UTERINE SUSPENSION     VIDEO BRONCHOSCOPY Bilateral 12/18/2014   Procedure: VIDEO BRONCHOSCOPY WITHOUT FLUORO;  Surgeon: Nyoka Cowden, MD;  Location: [facility];  Service: Cardiopulmonary;  Laterality: Bilateral;   Past Medical History:  Diagnosis Date   Acute duodenitis 04/24/2017   Allergy    ANEMIA    Anxiety    BACK PAIN, LUMBAR    Cancer of ascending colon pTispN0 s/p colectomy 03/05/2009 02/18/2010   Qualifier: Diagnosis of  By: Leone Payor MD, Charlyne Quale    CHF (congestive heart failure) (HCC)    Complete rotator cuff tear of left shoulder 11/27/2013   Ultrasound guided injection on November 27, 2013    COPD (chronic obstructive pulmonary disease) with emphysema (HCC)    COPD exacerbation (HCC) 11/14/2017   Crohn's 02/2010   ileal ulcers, intol of entercort--refuses treatment   Emphysema of lung (HCC)    GERD    History of blood transfusion    w/ c/s surgery   History of transfusion of whole blood    HYPERLIPIDEMIA  HYPERTENSION    Microscopic hematuria    chronic   Myocardial  infarction [facility]) 2010   Narcotic dependence (HCC) 05/25/2017   Obstruction of intestine or colon (HCC)    adhesions   OSTEOARTHRITIS    Rectal fissure    Possible fissure   Rotator cuff tear    Takotsubo syndrome 12/2008   URINARY INCONTINENCE    BP (!) 146/86   Pulse 82   Ht 5\' 1"  (1.549 m)   Wt 122 lb (55.3 kg)   LMP  (LMP Unknown)   SpO2 93%   BMI 23.05 kg/m   Opioid Risk Score:   Fall Risk Score:  `1  Depression screen PHQ 2/9     11/20/2022    2:10 PM 03/23/2022    9:19 PM 11/14/2021    9:21 AM 11/14/2021    9:17 AM 07/26/2021   10:17 AM 05/30/2021   10:10 AM 09/29/2020    9:38 AM  Depression screen PHQ 2/9  Decreased Interest 0 0 0 0 0 0 0  Down, Depressed, Hopeless 0 0 0 0 0 0 0  PHQ - 2 Score 0 0 0 0 0 0 0  Altered sleeping 1    0    Tired, decreased energy 0    0    Change in appetite 0    0    Feeling bad or failure about yourself  0    0    Trouble concentrating 0    0    Moving slowly or fidgety/restless 0    0    Suicidal thoughts 0    0    PHQ-9 Score 1    0    Difficult doing work/chores Not difficult at all    Not difficult at all        Review of Systems  Respiratory:  Positive for shortness of breath and wheezing.   Musculoskeletal:  Positive for myalgias.       Spasms Joint pain  All other systems reviewed and are negative.      Objective:   Physical Exam PE: Constitution: Appropriate appearance for age. No apparent distress   Resp: No respiratory distress. No accessory muscle usage. on RA Cardio: Well perfused appearance. No peripheral edema. Abdomen: Nondistended. Nontender.   Psych: Appropriate mood and affect. Neuro: AAOx4. No apparent cognitive deficits   Neurologic Exam:   DTRs: Reflexes were 2+ in bilateral achilles, patella, biceps, BR and triceps. Babinsky: flexor responses b/l.   Hoffmans: negative b/l Sensory exam: revealed normal sensation in all dermatomal regions in bilateral upper extremities and bilateral lower  extremities Motor exam: strength 5/5 throughout bilateral upper extremities and bilateral lower extremities Coordination: Fine motor coordination was normal.   Gait: normal  MSK: Neck and back No apparent deformity, minimal TTP along cervical paraspinals  Negative Spurling's bilaterally, negative Lhermitte's AROM neck limited in bilateral sidebending and rotation, R>L       Assessment & Plan:   Erica [name] is a 72 y.o. year old female  who  has a past medical history of Acute duodenitis (04/24/2017), Allergy, ANEMIA, Anxiety, BACK PAIN, LUMBAR, Cancer of ascending colon pTispN0 s/p colectomy 03/05/2009 (02/18/2010), CHF (congestive heart failure) (HCC), Complete rotator cuff tear of left shoulder (11/27/2013), COPD (chronic obstructive pulmonary disease) with emphysema (HCC), COPD exacerbation (HCC) (11/14/2017), Crohn's (02/2010), Emphysema of lung (HCC), GERD, History of blood transfusion, History of transfusion of whole blood, HYPERLIPIDEMIA, HYPERTENSION, Microscopic hematuria, Myocardial infarction (HCC) (2010), 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   They are presenting to PM&R clinic as a new patient for treatment of chronic neck and back pain.    Chronic pain syndrome Other osteoarthritis involving multiple joints  Patient referred for trial of Low-Dose naltrexone; patient refuses at this time. Current regimen is rare use of cyclobenzaprine. Has failed gabapentin (? Reason) in the past. Left chronic pain management Dr. Hillard Danker after 10/21/21 due to poor experience with clinic; was on Norco 5 mg TID.   Today, we discussed multiple options for pain control, including: return to physical therapy returning to Dr. Rubye Oaks to try medial branch blocks for cervical arthritis seen on imaging given local nature of current  symptoms starting low-dose naltrexone.    While we have not opted to do any of these interventions at this time, I will let Dr. Katrinka Blazing know about our discussion and you can follow-up with him regarding returning to Dr. Rubye Oaks.  Today, we are getting a urine drug screen and filling out a pain contract.  I will also need you to fill out a records request form from Dr. Frutoso Chase office so we can get your last visit appointment and lab records with her.  I will call you once we have these items together, and discuss whether or not you would be appropriate for long-term opiate pain management.  This is not a guarantee of medication prescription  Continue your home exercises, heat, and Tylenol as needed.  I will not schedule a follow-up with you until we have an opportunity to talk over the phone in approximately 1 week.  Cervical disc disorder with radiculopathy of cervical region Management as above; recommended trial of medial branch blocks   Reviewed MRI C spine 06/06/2020: 1. Prominent reactive marrow edema about the left C2-3 facet due to facet arthritis. Finding could serve as a source for left posterior neck pain. 2. Degenerative spondylosis and facet arthrosis at C3-4 through C5-6 with resultant mild to moderate right C4 through C6 foraminal narrowing. 3. Broad central disc protrusion at C6-7 with resultant mild spinal stenosis.  Lumbar radiculopathy, acute Management as above  Last ESI with Dr. Rubye Oaks 09/26/21 L4/5; with benefit, although did not last long  prior MRI L Spine 07/23/21  L3-L4: Trace, grade 1 anterolisthesis. Moderate spinal canal and bilateral subarticular stenosis. Moderate-severe bilateral neural foraminal stenosis. Potential impingement of the bilateral descending and exiting nerve roots.   L4-L5: Grade 1 anterolisthesis. Moderate bilateral subarticular stenosis. Moderate-severe bilateral neural foraminal stenosis. Potential impingement of the bilateral  descending and exiting nerve roots.   L5-S1: Moderate-severe bilateral neural foraminal stenosis, potentially impinging the exiting nerve roots.   Unchanged left adrenal adenoma measuring up to 3.0 cm.

## 2022-11-21 ENCOUNTER — Telehealth: Payer: Self-pay | Admitting: Family Medicine

## 2022-11-21 NOTE — Telephone Encounter (Signed)
Patient called stating that she saw Dr Shearon Stalls. She recommended a nerve block from Dr Rubye Oaks. She wanted to get Dr Michaelle Copas thoughts on this? She was in favor of this idea but said "I know what he was looking for and it's not going to turn out that way".  Please advise.

## 2022-11-22 ENCOUNTER — Other Ambulatory Visit: Payer: Self-pay

## 2022-11-22 DIAGNOSIS — M5416 Radiculopathy, lumbar region: Secondary | ICD-10-CM

## 2022-11-22 DIAGNOSIS — M5412 Radiculopathy, cervical region: Secondary | ICD-10-CM

## 2022-11-22 NOTE — Telephone Encounter (Signed)
Spoke with patient. She would like to proceed with lumbar and cervical epidurals. Per verbal from Dr. Katrinka Blazing both injections ordered.

## 2022-11-24 LAB — TOXASSURE SELECT,+ANTIDEPR,UR

## 2022-11-27 NOTE — Progress Notes (Incomplete)
Subjective:    Patient ID: Erica [name], female    DOB: February 23, 1951, 72 y.o.   MRN: [id number]  HPI HPI  Erica [name] is a 72 y.o. year old female  who  has a past medical history of Acute duodenitis (04/24/2017), Allergy, ANEMIA, Anxiety, BACK PAIN, LUMBAR, Cancer of ascending colon pTispN0 s/p colectomy 03/05/2009 (02/18/2010), CHF (congestive heart failure) (HCC), Complete rotator cuff tear of left shoulder (11/27/2013), COPD (chronic obstructive pulmonary disease) with emphysema (HCC), COPD exacerbation (HCC) (11/14/2017), Crohn's (02/2010), Emphysema of lung (HCC), GERD, History of blood transfusion, History of transfusion of whole blood, HYPERLIPIDEMIA, HYPERTENSION, Microscopic hematuria, Myocardial infarction (HCC) (2010), Narcotic dependence (HCC) (05/25/2017), Obstruction of intestine or colon (HCC), OSTEOARTHRITIS, Rectal fissure, Rotator cuff tear, Takotsubo syndrome (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   They are presenting to PM&R clinic as a new patient for pain management evaluation. They were referred by Dr. Katrinka Blazing for treatment of lumbar pain and polyarthralgia.   Per his last note: Significant osteoarthritic changes of multiple joints. Pain is fairly severe... Toradol and Depo-Medrol injections given today. Referred patient to pain management to see if she would be potentially a candidate from naltrexone. Has been on chronic pain medications previously but has not been on narcotics for some time now. Can follow-up with me again in 2 to 3 months if she would like Not changing management significantly.   Source: "All over the back", mostly her low back but also cervical. Also has R rotator cuff tear.   Inciting incident: none 40 years ago had a rock climbing accident where she was trying to rescue another climber and was swung into a wall. She worked at [employer] and at that time recommended surgery; she refused and moved.    Description of pain:  Started in her low back and goes down her legs bilaterally, burning and aching  Exacerbating factors: bending forward, laying flat, sitting, and rest Remitting factors: pain medication Red flag symptoms: No red flags for back pain endorsed in Hx or ROS  Medications tried: Topical medications (mild effect) : Used specialty compounded cream through Dr. Katrinka Blazing Nsaids ( cannot take ) : Avoids NSAIDs; was told to not take, "it was the same thing where they  Tylenol  (mild effect) : Takes 2 tabs in the morning and 3 at nighttime.   Opiates  (moderate effect) : Has been on tramadol and oxycodone in the past, had  ?stomach issues with Tramadol.   Was getting Norco 5 mg TID with Dr. Hillard Danker through 10/21/21. She stopped because she was switching doctors and "I called for my medication a day early and they said I needed to come in before I could get more, even though I had an appointment the 29th and it was the 20th. So I stopped them because, what, they think I'm selling my medication? I'm not going to deal with that." It "took the edge off my pain" and she feels like overall was beneficial.   Gabapentin / Lyrica  (unsure of effect) : Was on gabapentin in the past, caused a side effect, unsure what. Never tried lyrica.   TCAs  (never tried) :   SNRIs  (never tried) :   Other  (mild effect) : Has hot packs that she uses. Takes cyclobenzaprine 10 mg and takes rarely 1 pill every 2-3 months, avoids because it makes her tired and "I don't like the feeling".   Other treatments: PT/OT  (no effect) : Tried in the past multiple  times, most recently in 2021 after her knee replacement, but usually makes her pain worse.  Accupuncture/chiropractor/massage  (never tried) :  TENs unit (no effect) :  Injections (moderate effect) : Was getting epidural injections by Dr. Rubye Oaks in her low back; he stopped because she was needing them closer together.   With Dr. Katrinka Blazing, gets Depo-Medrol injections and  Toradol injections which helps, gets every 3 months and can do every 2 months if needed.   Surgery (never tried) : No, she has declined and then was told she was not a candidate.   Goals for pain control: "I would just like to be able to go up and down stairs, walk, clean in my house with less pain". 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   Recently moved out of a large home, but has to go up 19 steps to get in/out of her apartment, which is very tough. She has a good support system with family living nearby and neighbors who help.   Prior UDS results: No results found for: "LABOPIA", "COCAINSCRNUR", "LABBENZ", "AMPHETMU", "THCU", "LABBARB"     Pain Inventory Average Pain 8 Pain Right Now 8 My pain is constant and tingling  In the last 24 hours, has pain interfered with the following? General activity 0 Relation with others 0 Enjoyment of life 0 What TIME of day is your pain at its worst? varies Sleep (in general) Fair  Pain is worse with: walking, bending, standing, some activites, and getting up out of chair  Pain improves with: medication Relief from Meds: 4  use a cane  disabled: date disabled 2014  spasms  New pt  New pt    Family History  Problem Relation Age of Onset  . Throat cancer Mother   . Colon cancer Father 85  . Hypertension Father   . Heart disease Father   . Kidney disease Father   . Colon cancer Sister 57  . Liver cancer Sister   . Other Sister        amyloidosis  . Arthritis Other        Parent, other relative  . Stomach cancer Neg Hx   . Rectal cancer Neg Hx   . Breast cancer Neg Hx    Social History   Socioeconomic History  . Marital status: Widowed    Spouse name: Not on file  . Number of children: 5  . Years of education: 57  . Highest education level: High school graduate  Occupational History  . Occupation: Retired     Associate Professor: UNEMPLOYED   Tobacco Use  . Smoking status: Every Day    Current packs/day: 1.00    Average packs/day: 1 pack/day for 53.0 years (53.0 ttl pk-yrs)    Types: Cigarettes    Passive exposure: Never  . Smokeless tobacco: Never  . Tobacco comments:    Smokes 0.5 packs of cigarettes daily ARJ 08/22/22  Vaping Use  . Vaping status: Never Used  Substance and Sexual Activity  . Alcohol use: Yes    Comment: occasional mixed drink  . Drug use: No  . Sexual activity: Not Currently    Birth control/protection: Surgical    Comment: Hysterectomy  Other Topics Concern  . Not on file  Social History Narrative   Patient is a widow.   Lives in an Apt. 1 son lives in [place] the other 4 adult children live in [place].  Continues to smoke daily, occasional alcohol no drug use   Social Determinants of Health   Financial Resource Strain: Low Risk  (11/14/2021)   Overall Financial Resource Strain (CARDIA)   . Difficulty of Paying Living Expenses: Not hard at all  Food Insecurity: No Food Insecurity (11/14/2021)   Hunger Vital Sign   . Worried About Programme researcher, broadcasting/film/video in the Last Year: Never true   . Ran Out of Food in the Last Year: Never true  Transportation Needs: No Transportation Needs (11/14/2021)   PRAPARE - Transportation   . Lack of Transportation (Medical): No   . Lack of Transportation (Non-Medical): No  Physical Activity: Sufficiently Active (11/14/2021)   Exercise Vital Sign   . Days of Exercise per Week: 5 days   . Minutes of Exercise per Session: 30 min  Stress: No Stress Concern Present (11/14/2021)   [employer] of Occupational Health - Occupational Stress Questionnaire   . Feeling of Stress : Not at all  Social Connections: Moderately Integrated (11/14/2021)   Social Connection and Isolation Panel [NHANES]   . Frequency of Communication with Friends and Family: Three times a week   . Frequency of Social Gatherings with Friends and Family: Three times a week   . Attends Religious  Services: More than 4 times per year   . Active Member of Clubs or Organizations: Yes   . Attends Banker Meetings: More than 4 times per year   . Marital Status: Widowed   Past Surgical History:  Procedure Laterality Date  . ABDOMINAL HYSTERECTOMY  1994  . APPENDECTOMY  1964  . BLADDER SUSPENSION    . CESAREAN SECTION     x 3  . CHOLECYSTECTOMY  1995  . COLONOSCOPY  2017  . COLONOSCOPY W/ BIOPSIES AND POLYPECTOMY  01/24/2011   (Crohn's)ileitis, internal hemorrhoids  . ECTOPIC PREGNANCY SURGERY    . ESOPHAGOGASTRODUODENOSCOPY    . FACIAL COSMETIC SURGERY    . HAND SURGERY Bilateral 1991   x 2  . KNEE SURGERY Right 1981  . LYSIS OF ADHESION  2010   ex lap/LOA for SBO Dr Johna Sheriff  . ORBITAL FRACTURE SURGERY  1990  . RIGHT COLECTOMY  03/2009   Right colectomy for colon CA.  Dr Dwain Sarna  . TONSILLECTOMY  1952  . TOTAL KNEE ARTHROPLASTY  03/2010   Dr Charlann Boxer.  Depuy  . UPPER GASTROINTESTINAL ENDOSCOPY  05/16/2010   normal  . UTERINE SUSPENSION    . VIDEO BRONCHOSCOPY Bilateral 12/18/2014   Procedure: VIDEO BRONCHOSCOPY WITHOUT FLUORO;  Surgeon: Nyoka Cowden, MD;  Location: [facility];  Service: Cardiopulmonary;  Laterality: Bilateral;   Past Medical History:  Diagnosis Date  . Acute duodenitis 04/24/2017  . Allergy   . ANEMIA   . Anxiety   . BACK PAIN, LUMBAR   . Cancer of ascending colon pTispN0 s/p colectomy 03/05/2009 02/18/2010   Qualifier: Diagnosis of  By: Leone Payor MD, Charlyne Quale   . CHF (congestive heart failure) (HCC)   . Complete rotator cuff tear of left shoulder 11/27/2013   Ultrasound guided injection on November 27, 2013   . COPD (chronic obstructive pulmonary disease) with emphysema (HCC)   . COPD exacerbation (HCC) 11/14/2017  . Crohn's 02/2010   ileal ulcers, intol of entercort--refuses treatment  . Emphysema of lung (HCC)   . GERD   . History of blood transfusion    w/ c/s surgery  . History of transfusion of whole blood   .  HYPERLIPIDEMIA   .  HYPERTENSION   . Microscopic hematuria    chronic  . Myocardial infarction (HCC) 2010  . Narcotic dependence (HCC) 05/25/2017  . Obstruction of intestine or colon (HCC)    adhesions  . OSTEOARTHRITIS   . Rectal fissure    Possible fissure  . Rotator cuff tear   . Takotsubo syndrome 12/2008  . URINARY INCONTINENCE    BP (!) 146/86   Pulse 82   Ht 5\' 1"  (1.549 m)   Wt 122 lb (55.3 kg)   LMP  (LMP Unknown)   SpO2 93%   BMI 23.05 kg/m   Opioid Risk Score:   Fall Risk Score:  `1  Depression screen PHQ 2/9     11/20/2022    2:10 PM 03/23/2022    9:19 PM 11/14/2021    9:21 AM 11/14/2021    9:17 AM 07/26/2021   10:17 AM 05/30/2021   10:10 AM 09/29/2020    9:38 AM  Depression screen PHQ 2/9  Decreased Interest 0 0 0 0 0 0 0  Down, Depressed, Hopeless 0 0 0 0 0 0 0  PHQ - 2 Score 0 0 0 0 0 0 0  Altered sleeping 1    0    Tired, decreased energy 0    0    Change in appetite 0    0    Feeling bad or failure about yourself  0    0    Trouble concentrating 0    0    Moving slowly or fidgety/restless 0    0    Suicidal thoughts 0    0    PHQ-9 Score 1    0    Difficult doing work/chores Not difficult at all    Not difficult at all        Review of Systems  Respiratory:  Positive for shortness of breath and wheezing.   Musculoskeletal:  Positive for myalgias.       Spasms Joint pain  All other systems reviewed and are negative.      Objective:   Physical Exam PE: Constitution: Appropriate appearance for age. No apparent distress   Resp: No respiratory distress. No accessory muscle usage. on RA Cardio: Well perfused appearance. No peripheral edema. Abdomen: Nondistended. Nontender.   Psych: Appropriate mood and affect. Neuro: AAOx4. No apparent cognitive deficits   Neurologic Exam:   DTRs: Reflexes were 2+ in bilateral achilles, patella, biceps, BR and triceps. Babinsky: flexor responses b/l.   Hoffmans: negative b/l Sensory exam: revealed  normal sensation in all dermatomal regions in bilateral upper extremities and bilateral lower extremities Motor exam: strength 5/5 throughout bilateral upper extremities and bilateral lower extremities Coordination: Fine motor coordination was normal.   Gait: normal  MSK: Neck and back No apparent deformity, minimal TTP along cervical paraspinals  Negative Spurling's bilaterally, negative Lhermitte's AROM neck limited in bilateral sidebending and rotation, R>L       Assessment & Plan:   Erica [name] is a 72 y.o. year old female  who  has a past medical history of Acute duodenitis (04/24/2017), Allergy, ANEMIA, Anxiety, BACK PAIN, LUMBAR, Cancer of ascending colon pTispN0 s/p colectomy 03/05/2009 (02/18/2010), CHF (congestive heart failure) (HCC), Complete rotator cuff tear of left shoulder (11/27/2013), COPD (chronic obstructive pulmonary disease) with emphysema (HCC), COPD exacerbation (HCC) (11/14/2017), Crohn's (02/2010), Emphysema of lung (HCC), GERD, History of blood transfusion, History of transfusion of whole blood, HYPERLIPIDEMIA, HYPERTENSION, Microscopic hematuria, Myocardial infarction (HCC) (2010), Narcotic dependence (HCC) (05/25/2017), Obstruction of  intestine or colon (HCC), OSTEOARTHRITIS, Rectal fissure, Rotator cuff tear, Takotsubo syndrome (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   They are presenting to PM&R clinic as a new patient for treatment of chronic neck and back pain.   Encounter for chronic pain management -     [facility],+Antidepr,UR  Chronic pain syndrome -     Environmental consultant for medication management -     [facility],+Antidepr,UR      IMPRESSION: Multilevel degenerative changes of the lumbar spine, most significant at L3-L4, L4-L5, and L5-S1, as summarized below:   L3-L4: Trace, grade 1 anterolisthesis. Moderate spinal canal and bilateral subarticular stenosis. Moderate-severe bilateral neural foraminal stenosis.  Potential impingement of the bilateral descending and exiting nerve roots.   L4-L5: Grade 1 anterolisthesis. Moderate bilateral subarticular stenosis. Moderate-severe bilateral neural foraminal stenosis. Potential impingement of the bilateral descending and exiting nerve roots.   L5-S1: Moderate-severe bilateral neural foraminal stenosis, potentially impinging the exiting nerve roots.   Unchanged left adrenal adenoma measuring up to 3.0 cm.

## 2022-11-27 NOTE — Progress Notes (Deleted)
Urine drug screen as expected  Received records from [place] [facility].  Last records for pain follow-up were 07-26-2021.  No assessment and plan documented at that visit., or most of the visits per record.

## 2022-11-29 DIAGNOSIS — F99 Mental disorder, not otherwise specified: Secondary | ICD-10-CM | POA: Diagnosis not present

## 2022-12-01 ENCOUNTER — Telehealth: Payer: Self-pay | Admitting: Physical Medicine and Rehabilitation

## 2022-12-01 DIAGNOSIS — M158 Other polyosteoarthritis: Secondary | ICD-10-CM

## 2022-12-01 DIAGNOSIS — Z79899 Other long term (current) drug therapy: Secondary | ICD-10-CM

## 2022-12-01 DIAGNOSIS — G894 Chronic pain syndrome: Secondary | ICD-10-CM

## 2022-12-01 DIAGNOSIS — M5010 Cervical disc disorder with radiculopathy, unspecified cervical region: Secondary | ICD-10-CM

## 2022-12-01 DIAGNOSIS — M5416 Radiculopathy, lumbar region: Secondary | ICD-10-CM

## 2022-12-01 DIAGNOSIS — G8929 Other chronic pain: Secondary | ICD-10-CM

## 2022-12-01 MED ORDER — HYDROCODONE-ACETAMINOPHEN 5-325 MG PO TABS: 1.0000 | ORAL_TABLET | Freq: Two times a day (BID) | ORAL | 0 refills | Status: DC | PRN

## 2022-12-01 NOTE — Telephone Encounter (Deleted)
-----   Message from Erica [name] sent at 11/27/2022 12:27 AM EDT ----- Call with records, UDS for pain management discussion

## 2022-12-01 NOTE — Telephone Encounter (Signed)
Received records from Dr. Frutoso Chase office, unfortunately did not include assessment and plan portion but no notable medication concerns noted in subjective histories.  UDS as expected.  Discussed patient with Dr. Katrinka Blazing, agreed that she may be a good candidate for chronic pain management.  Called patient back and discussed treatment plan.  She is scheduled to get both cervical and lumbar epidural steroid injections soon, and is hopeful that these help with her pain; I encouraged her to keep these appointments and of also hopeful that they help with her symptoms.  We discussed resuming prior regimen of Norco 5 mg twice daily as needed, versus transitioning to a Butrans patch for longer acting medication.  Patient states that with Norco twice daily, she is able to be functional, and that it allows her to control her pain early in the morning to get moving and at nighttime to get adequate rest.  She denies medication wearing off between doses.  She states it "takes the edge off", does not cause side effects, and does not completely alleviate her pain but does improve her functional mobility and quality of life.  After discussion of risks versus benefits with the patient, we agreed on resuming her prior regimen of Norco 5 mg twice daily as needed.  She has already completed a chronic pain contract with our clinic, and completed urine drug screen as required for chronic pain management.  I prescribed her 3 months worth of medication, and we will have our clinic call her early next week to set up a 12-month follow-up appointment with myself or our nurse practitioner Riley Lam.  Patient was extremely grateful of the care received by Dr. Katrinka Blazing in our office, and was agreeable with this plan.   Angelina Sheriff, DO 12/01/2022

## 2022-12-06 DIAGNOSIS — F99 Mental disorder, not otherwise specified: Secondary | ICD-10-CM | POA: Diagnosis not present

## 2022-12-06 NOTE — Discharge Instructions (Signed)

## 2022-12-07 ENCOUNTER — Other Ambulatory Visit: Payer: Self-pay | Admitting: Family Medicine

## 2022-12-07 ENCOUNTER — Ambulatory Visit
Admission: RE | Admit: 2022-12-07 | Discharge: 2022-12-07 | Disposition: A | Discharge status: Home / Self Care | Payer: Medicare Other | Source: Ambulatory Visit | Attending: Family Medicine | Admitting: Family Medicine

## 2022-12-07 DIAGNOSIS — M5416 Radiculopathy, lumbar region: Secondary | ICD-10-CM

## 2022-12-07 DIAGNOSIS — M5412 Radiculopathy, cervical region: Secondary | ICD-10-CM

## 2022-12-07 DIAGNOSIS — M5116 Intervertebral disc disorders with radiculopathy, lumbar region: Secondary | ICD-10-CM | POA: Diagnosis not present

## 2022-12-07 MED ORDER — METHYLPREDNISOLONE ACETATE 40 MG/ML INJ SUSP (RADIOLOG
80.0000 mg | Freq: Once | INTRAMUSCULAR | Status: AC
  Administered 2022-12-07: 80 mg via EPIDURAL

## 2022-12-07 MED ORDER — IOPAMIDOL (ISOVUE-M 200) INJECTION 41%
1.0000 mL | Freq: Once | INTRAMUSCULAR | Status: AC
  Administered 2022-12-07: 1 mL via EPIDURAL

## 2022-12-11 NOTE — Progress Notes (Unsigned)
[name] [address] [place] [address] Phone: [phone] Subjective:   [name], am serving as a scribe for Dr. Antoine Primas.  I'm seeing this patient by the request  of:  McElwee, [name], NP  CC: Hand pain follow-up.  ION:[id number]  09/14/2022 Significant osteoarthritic changes of multiple joints.  Pain is fairly severe.  Patient's stress is also significantly up secondary to wanting to know more about her pulmonary nodule.  Patient is following up with pulmonology in the near future.  Toradol and Depo-Medrol injections given today.  Referred patient to pain management to see if she would be potentially a candidate from naltrexone.  Has been on chronic pain medications previously but has not been on narcotics for some time now.  Can follow-up with me again in 2 to 3 months if she would like Not changing management significantly.     Update 12/12/2022 Kimiye Barman is a 72 y.o. female coming in with complaint of polyarthralgia. Patient states just here for 3 month check in. Thinks she had an allergic reaction after last epidural, started itching. Not sure about cocktail injections today. R knee is hurting a lot more, has steps at new apartment. R hand losing strength and pain in hand in the morning when trying to extend fingers in the morning. Middle finger is the worse.    Past Medical History:  Diagnosis Date   Acute duodenitis 04/24/2017   Allergy    ANEMIA    Anxiety    BACK PAIN, LUMBAR    Cancer of ascending colon [id number] s/p colectomy 03/05/2009 02/18/2010   Qualifier: Diagnosis of  By: Leone Payor MD, Alfonse Ras E    CHF (congestive heart failure) (HCC)    Complete rotator cuff tear of left shoulder 11/27/2013   Ultrasound guided injection on November 27, 2013    COPD (chronic obstructive pulmonary disease) with emphysema (HCC)    COPD exacerbation (HCC) 11/14/2017   Crohn's 02/2010   ileal ulcers, intol of entercort--refuses  treatment   Emphysema of lung (HCC)    GERD    History of blood transfusion    w/ c/s surgery   History of transfusion of whole blood    HYPERLIPIDEMIA    HYPERTENSION    Microscopic hematuria    chronic   Myocardial infarction (HCC) 2010   Narcotic dependence (HCC) 05/25/2017   Obstruction of intestine or colon (HCC)    adhesions   OSTEOARTHRITIS    Rectal fissure    Possible fissure   Rotator cuff tear    Takotsubo syndrome 12/2008   URINARY INCONTINENCE    Past Surgical History:  Procedure Laterality Date   ABDOMINAL HYSTERECTOMY  1994   APPENDECTOMY  1964   BLADDER SUSPENSION     CESAREAN SECTION     x 3   CHOLECYSTECTOMY  1995   COLONOSCOPY  2017   COLONOSCOPY W/ BIOPSIES AND POLYPECTOMY  01/24/2011   (Crohn's)ileitis, internal hemorrhoids   ECTOPIC PREGNANCY SURGERY     ESOPHAGOGASTRODUODENOSCOPY     FACIAL COSMETIC SURGERY     HAND SURGERY Bilateral 1991   x 2   KNEE SURGERY Right 1981   LYSIS OF ADHESION  2010   ex lap/LOA for SBO Dr Johna Sheriff   ORBITAL FRACTURE SURGERY  1990   RIGHT COLECTOMY  03/2009   Right colectomy for colon CA.  Dr Dwain Sarna   TONSILLECTOMY  1952   TOTAL KNEE ARTHROPLASTY  03/2010   Dr Charlann Boxer.  Depuy   UPPER GASTROINTESTINAL ENDOSCOPY  05/16/2010   normal   UTERINE SUSPENSION     VIDEO BRONCHOSCOPY Bilateral 12/18/2014   Procedure: VIDEO BRONCHOSCOPY WITHOUT FLUORO;  Surgeon: Nyoka Cowden, MD;  Location: [facility];  Service: Cardiopulmonary;  Laterality: Bilateral;   Social History   Socioeconomic History   Marital status: Widowed    Spouse name: Not on file   Number of children: 5   Years of education: 14   Highest education level: High school graduate  Occupational History   Occupation: Retired     Associate Professor: UNEMPLOYED  Tobacco Use   Smoking status: Every Day    Current packs/day: 1.00    Average packs/day: 1 pack/day for 53.0 years (53.0 ttl pk-yrs)    Types: Cigarettes    Passive exposure: Never   Smokeless  tobacco: Never   Tobacco comments:    Smokes 0.5 packs of cigarettes daily [facility] 08/22/22  Vaping Use   Vaping status: Never Used  Substance and Sexual Activity   Alcohol use: Yes    Comment: occasional mixed drink   Drug use: No   Sexual activity: Not Currently    Birth control/protection: Surgical    Comment: Hysterectomy  Other Topics Concern   Not on file  Social History Narrative   Patient is a widow.   Lives in an Apt. 1 son lives in [place] the other 4 adult children live in [place].   Continues to smoke daily, occasional alcohol no drug use   Social Determinants of Health   Financial Resource Strain: Low Risk  (11/14/2021)   Overall Financial Resource Strain (CARDIA)    Difficulty of Paying Living Expenses: Not hard at all  Food Insecurity: No Food Insecurity (11/14/2021)   Hunger Vital Sign    Worried About Running Out of Food in the Last Year: Never true    Ran Out of Food in the Last Year: Never true  Transportation Needs: No Transportation Needs (11/14/2021)   PRAPARE - Administrator, Civil Service (Medical): No    Lack of Transportation (Non-Medical): No  Physical Activity: Sufficiently Active (11/14/2021)   Exercise Vital Sign    Days of Exercise per Week: 5 days    Minutes of Exercise per Session: 30 min  Stress: No Stress Concern Present (11/14/2021)   [employer] of Occupational Health - Occupational Stress Questionnaire    Feeling of Stress : Not at all  Social Connections: Moderately Integrated (11/14/2021)   Social Connection and Isolation Panel [NHANES]    Frequency of Communication with Friends and Family: Three times a week    Frequency of Social Gatherings with Friends and Family: Three times a week    Attends Religious Services: More than 4 times per year    Active Member of Clubs or Organizations: Yes    Attends Banker Meetings: More than 4 times per year    Marital Status: Widowed   Allergies  Allergen Reactions    Morphine Nausea And Vomiting   Family History  Problem Relation Age of Onset   Throat cancer Mother    Colon cancer Father 69   Hypertension Father    Heart disease Father    Kidney disease Father    Colon cancer Sister 66   Liver cancer Sister    Other Sister        amyloidosis   Arthritis Other        Parent, other relative   Stomach cancer Neg Hx  Rectal cancer Neg Hx    Breast cancer Neg Hx      Current Outpatient Medications (Cardiovascular):    metoprolol tartrate (LOPRESSOR) 25 MG tablet, TAKE 1 TABLET BY MOUTH TWICE DAILY --PLEASE  CALL  OUR  OFFICE  TO  SCHEDULE  AN  APPT  FOR  FUTURE  REFILLS  --2ND  ATTEMPT   rosuvastatin (CRESTOR) 10 MG tablet, TAKE 1 TABLET BY MOUTH 5 TIMES A WEEK ON  MONDAY-FRIDAY (Patient taking differently: Take 10 mg by mouth 2 (two) times a week. Patient takes 10mg  on Mondays and 10mg  on Fridays.)  Current Outpatient Medications (Respiratory):    albuterol (PROVENTIL) (2.5 MG/3ML) 0.083% nebulizer solution, Take 3 mLs (2.5 mg total) by nebulization every 6 (six) hours as needed for wheezing or shortness of breath.   albuterol (VENTOLIN HFA) 108 (90 Base) MCG/ACT inhaler, INHALE 2 PUFFS BY MOUTH EVERY 6 HOURS AS NEEDED FOR WHEEZING FOR SHORTNESS OF BREATH   fluticasone (FLONASE) 50 MCG/ACT nasal spray, Place 2 sprays into both nostrils daily.  Current Outpatient Medications (Analgesics):    aspirin EC 81 MG tablet, Take 81 mg by mouth daily.   HYDROcodone-acetaminophen (NORCO) 5-325 MG tablet, Take 1 tablet by mouth 2 (two) times daily as needed for severe pain.   [START ON 12/30/2022] HYDROcodone-acetaminophen (NORCO) 5-325 MG tablet, Take 1 tablet by mouth 2 (two) times daily as needed for severe pain.   [START ON 01/29/2023] HYDROcodone-acetaminophen (NORCO) 5-325 MG tablet, Take 1 tablet by mouth 2 (two) times daily as needed for severe pain.   Current Outpatient Medications (Other):    cyclobenzaprine (FLEXERIL) 10 MG tablet, Take 1 tablet  by mouth three times daily as needed for muscle spasm   hydrOXYzine (ATARAX) 10 MG tablet, Take 1 tablet (10 mg total) by mouth 3 (three) times daily as needed for anxiety.   Vitamin D, Ergocalciferol, (DRISDOL) 1.25 MG (50000 UNIT) CAPS capsule, Take 1 capsule by mouth once a week   Reviewed prior external information including notes and imaging from  primary care provider As well as notes that were available from care everywhere and other healthcare systems.  Past medical history, social, surgical and family history all reviewed in electronic medical record.  No pertanent information unless stated regarding to the chief complaint.   Review of Systems:  No headache, visual changes, nausea, vomiting, diarrhea, constipation, dizziness, abdominal pain, skin rash, fevers, chills, night sweats, weight loss, swollen lymph nodes, body aches, joint swelling, chest pain, shortness of breath, mood changes. POSITIVE muscle aches  Objective  Blood pressure 112/74, pulse 70, height 5\' 1"  (1.549 m), weight 119 lb (54 kg), SpO2 98%.   General: No apparent distress alert and oriented x3 mood and affect normal, dressed appropriately.  HEENT: Pupils equal, extraocular movements intact  Respiratory: Patient's speak in full sentences and does not appear short of breath  Cardiovascular: No lower extremity edema, non tender, no erythema  Patient does have a chronic cough noted.  Patient does have tenderness to palpation in the hand.  Does seem to have a trigger nodule noted at the A2 pulley.  This is of the third flexor tendon sheath.  Procedure: Real-time Ultrasound Guided Injection of right middle flexor tendon sheath Device: GE Logiq Q7 Ultrasound guided injection is preferred based studies that show increased duration, increased effect, greater accuracy, decreased procedural pain, increased response rate, and decreased cost with ultrasound guided versus blind injection.  Verbal informed consent obtained.   Time-out conducted.  Noted no overlying  erythema, induration, or other signs of local infection.  Skin prepped in a sterile fashion.  Local anesthesia: Topical Ethyl chloride.  With sterile technique and under real time ultrasound guidance: With a 25-gauge half inch needle injecting 0.5 cc of 0.5% Marcaine and 0.5 cc of Kenalog 40 mg/mL Completed without difficulty  Pain immediately resolved suggesting accurate placement of the medication.  Advised to call if fevers/chills, erythema, induration, drainage, or persistent bleeding.  Impression: Technically successful ultrasound guided injection.    Impression and Recommendations:     The above documentation has been reviewed and is accurate and complete Judi Saa, DO

## 2022-12-12 ENCOUNTER — Other Ambulatory Visit: Payer: Self-pay

## 2022-12-12 ENCOUNTER — Ambulatory Visit (INDEPENDENT_AMBULATORY_CARE_PROVIDER_SITE_OTHER): Payer: Medicare Other | Admitting: Family Medicine

## 2022-12-12 ENCOUNTER — Encounter: Payer: Self-pay | Admitting: Family Medicine

## 2022-12-12 VITALS — BP 112/74 | HR 70 | Ht 61.0 in | Wt 119.0 lb

## 2022-12-12 DIAGNOSIS — M65331 Trigger finger, right middle finger: Secondary | ICD-10-CM | POA: Diagnosis not present

## 2022-12-12 NOTE — Assessment & Plan Note (Addendum)
Patient given injection and tolerated the procedure well, discussed with patient bracing at night.  Do believe that based on multiple different comorbidities that is contributing including potentially cervical radiculopathy as well.  Patient is seeing another provider now for pain management which I think will be beneficial.  Can help with other injections and some of her other chronic pains when needed.  Follow-up with me again in 3 months after reviewing the chart as well as discussing with patient total time 32 minutes.

## 2022-12-12 NOTE — Patient Instructions (Signed)
Injected hand today Hinged brace See me again in 3 months

## 2022-12-13 DIAGNOSIS — F99 Mental disorder, not otherwise specified: Secondary | ICD-10-CM | POA: Diagnosis not present

## 2022-12-20 NOTE — Discharge Instructions (Signed)
Post Procedure Spinal Discharge Instruction Sheet  You may resume a regular diet and any medications that you routinely take (including pain medications) unless otherwise noted by MD.  No driving day of procedure.  Light activity throughout the rest of the day.  Do not do any strenuous work, exercise, bending or lifting.  The day following the procedure, you can resume normal physical activity but you should refrain from exercising or physical therapy for at least three days thereafter.  You may apply ice to the injection site, 20 minutes on, 20 minutes off, as needed. Do not apply ice directly to skin.    Common Side Effects:  Headaches- take your usual medications as directed by your physician.  Increase your fluid intake.  Caffeinated beverages may be helpful.  Lie flat in bed until your headache resolves.  Restlessness or inability to sleep- you may have trouble sleeping for the next few days.  Ask your referring physician if you need any medication for sleep.  Facial flushing or redness- should subside within a few days.  Increased pain- a temporary increase in pain a day or two following your procedure is not unusual.  Take your pain medication as prescribed by your referring physician.  Leg cramps  Please contact our office at [phone] for the following symptoms: Fever greater than 100 degrees. Headaches unresolved with medication after 2-3 days. Increased swelling, pain, or redness at injection site.   Thank you for visiting [facility] today.    YOU MAY RESUME YOUR ASPIRIN TODAY, POST PROCEDURE.

## 2022-12-21 ENCOUNTER — Ambulatory Visit
Admission: RE | Admit: 2022-12-21 | Discharge: 2022-12-21 | Disposition: A | Discharge status: Home / Self Care | Payer: Medicare Other | Source: Ambulatory Visit | Attending: Family Medicine | Admitting: Family Medicine

## 2022-12-21 ENCOUNTER — Encounter: Payer: Self-pay | Admitting: Radiology

## 2022-12-21 DIAGNOSIS — M5412 Radiculopathy, cervical region: Secondary | ICD-10-CM

## 2022-12-21 DIAGNOSIS — M50223 Other cervical disc displacement at C6-C7 level: Secondary | ICD-10-CM | POA: Diagnosis not present

## 2022-12-21 MED ORDER — IOPAMIDOL (ISOVUE-M 300) INJECTION 61%
1.0000 mL | Freq: Once | INTRAMUSCULAR | Status: AC
  Administered 2022-12-21: 1 mL via EPIDURAL

## 2022-12-21 MED ORDER — TRIAMCINOLONE ACETONIDE 40 MG/ML IJ SUSP (RADIOLOGY)
60.0000 mg | Freq: Once | INTRAMUSCULAR | Status: AC
  Administered 2022-12-21: 60 mg via EPIDURAL

## 2022-12-25 ENCOUNTER — Encounter: Payer: Self-pay | Admitting: Nurse Practitioner

## 2022-12-25 ENCOUNTER — Ambulatory Visit (INDEPENDENT_AMBULATORY_CARE_PROVIDER_SITE_OTHER): Payer: Medicare Other | Admitting: Nurse Practitioner

## 2022-12-25 VITALS — BP 138/80 | HR 77 | Temp 97.4°F | Ht 61.0 in | Wt 120.4 lb

## 2022-12-25 DIAGNOSIS — E782 Mixed hyperlipidemia: Secondary | ICD-10-CM

## 2022-12-25 DIAGNOSIS — R7303 Prediabetes: Secondary | ICD-10-CM

## 2022-12-25 DIAGNOSIS — Z23 Encounter for immunization: Secondary | ICD-10-CM | POA: Diagnosis not present

## 2022-12-25 DIAGNOSIS — E559 Vitamin D deficiency, unspecified: Secondary | ICD-10-CM | POA: Diagnosis not present

## 2022-12-25 DIAGNOSIS — R5383 Other fatigue: Secondary | ICD-10-CM | POA: Diagnosis not present

## 2022-12-25 DIAGNOSIS — I10 Essential (primary) hypertension: Secondary | ICD-10-CM | POA: Diagnosis not present

## 2022-12-25 DIAGNOSIS — R911 Solitary pulmonary nodule: Secondary | ICD-10-CM

## 2022-12-25 DIAGNOSIS — G894 Chronic pain syndrome: Secondary | ICD-10-CM

## 2022-12-25 DIAGNOSIS — J439 Emphysema, unspecified: Secondary | ICD-10-CM

## 2022-12-25 LAB — LIPID PANEL
Cholesterol: 177 mg/dL (ref 0–200)
HDL: 65.8 mg/dL (ref >39.00)
LDL Cholesterol: 84 mg/dL (ref 0–99)
NonHDL: 110.85
Total CHOL/HDL Ratio: 3
Triglycerides: 136 mg/dL (ref 0.0–149.0)
VLDL: 27.2 mg/dL (ref 0.0–40.0)

## 2022-12-25 LAB — COMPREHENSIVE METABOLIC PANEL
ALT: 14 U/L (ref 0–35)
AST: 14 U/L (ref 0–37)
Albumin: 4.3 g/dL (ref 3.5–5.2)
Alkaline Phosphatase: 55 U/L (ref 39–117)
BUN: 18 mg/dL (ref 6–23)
CO2: 28 mEq/L (ref 19–32)
Calcium: 9.9 mg/dL (ref 8.4–10.5)
Chloride: 106 mEq/L (ref 96–112)
Creatinine, Ser: 0.65 mg/dL (ref 0.40–1.20)
GFR: 87.74 mL/min (ref >60.00)
Glucose, Bld: 95 mg/dL (ref 70–99)
Potassium: 4.9 mEq/L (ref 3.5–5.1)
Sodium: 143 mEq/L (ref 135–145)
Total Bilirubin: 0.4 mg/dL (ref 0.2–1.2)
Total Protein: 6.5 g/dL (ref 6.0–8.3)

## 2022-12-25 LAB — CBC WITH DIFFERENTIAL/PLATELET
Basophils Absolute: 0 10*3/uL (ref 0.0–0.1)
Basophils Relative: 0.3 % (ref 0.0–3.0)
Eosinophils Absolute: 0 10*3/uL (ref 0.0–0.7)
Eosinophils Relative: 0.3 % (ref 0.0–5.0)
HCT: 40.8 % (ref 36.0–46.0)
Hemoglobin: 13.2 g/dL (ref 12.0–15.0)
Lymphocytes Relative: 22.8 % (ref 12.0–46.0)
Lymphs Abs: 1.8 10*3/uL (ref 0.7–4.0)
MCHC: 32.3 g/dL (ref 30.0–36.0)
MCV: 95.3 fl (ref 78.0–100.0)
Monocytes Absolute: 0.7 10*3/uL (ref 0.1–1.0)
Monocytes Relative: 9.1 % (ref 3.0–12.0)
Neutro Abs: 5.2 10*3/uL (ref 1.4–7.7)
Neutrophils Relative %: 67.5 % (ref 43.0–77.0)
Platelets: 241 10*3/uL (ref 150.0–400.0)
RBC: 4.29 Mil/uL (ref 3.87–5.11)
RDW: 13.3 % (ref 11.5–15.5)
WBC: 7.7 10*3/uL (ref 4.0–10.5)

## 2022-12-25 LAB — HEMOGLOBIN A1C: Hgb A1c MFr Bld: 5.9 % (ref 4.6–6.5)

## 2022-12-25 LAB — TSH: TSH: 0.35 u[IU]/mL (ref 0.35–5.50)

## 2022-12-25 LAB — VITAMIN D 25 HYDROXY (VIT D DEFICIENCY, FRACTURES): VITD: 52.91 ng/mL (ref 30.00–100.00)

## 2022-12-25 MED ORDER — FLUTICASONE PROPIONATE 50 MCG/ACT NA SUSP: 2.0000 | Freq: Every day | NASAL | 3 refills | Status: DC

## 2022-12-25 MED ORDER — ROSUVASTATIN CALCIUM 10 MG PO TABS: 10.0000 mg | ORAL_TABLET | ORAL | 3 refills | Status: DC

## 2022-12-25 NOTE — Assessment & Plan Note (Signed)
Chronic, stable.  Continue rosuvastatin Monday and Friday.  Check CMP, CBC, lipid panel today. Follow- up in 6 months.

## 2022-12-25 NOTE — Assessment & Plan Note (Signed)
Chronic, stable.  Continue metoprolol 25 mg twice daily.  Check CMP, CBC today.

## 2022-12-25 NOTE — Assessment & Plan Note (Signed)
Chronic, stable. Continue albuterol as needed. She has not been able to tolerate long acting inhalers in the past.

## 2022-12-25 NOTE — Assessment & Plan Note (Signed)
She is currently following with pulmonology. She had a PET scan which showed that the nodule has decreased in size. She is still following with pulmonology and going to have repeat CT scan.

## 2022-12-25 NOTE — Progress Notes (Signed)
Established Patient Office Visit  Subjective   Patient ID: Erica [name], female    DOB: [birth year]  Age: 72 y.o. MRN: [id number]  Chief Complaint  Patient presents with   Hypertension    follow up, fatigue, itching a lot on back and arms   HPI:  Erica [name] is here to follow-up on chronic disease management. She also has been experiencing fatigue and itching on her back and arms.   She states the itching started for the last few weeks. She states that she can't take benadryl. She also has been experiencing fatigue which is unusual for her. She has not tried anything. She has been using sensitive skin laundry detergent and soaps. She states that her sleeping is stable and while she states up late, she is sleeping.   HYPERTENSION / HYPERLIPIDEMIA  Satisfied with current treatment? yes Duration of hypertension: chronic BP monitoring frequency: not checking BP range: n/a BP medication side effects: no Past BP meds: metoprolol Duration of hyperlipidemia: chronic Cholesterol medication side effects: no Cholesterol supplements: none Past cholesterol medications: rosuvastatin Medication compliance: excellent compliance Aspirin: yes Recent stressors: no Recurrent headaches: no Visual changes: no Palpitations: no Dyspnea: no Chest pain: no Lower extremity edema: no Dizzy/lightheaded: no     ROS See pertinent positives and negatives per HPI.    Objective:     BP 138/80 (BP Location: Left Arm)   Pulse 77   Temp (!) 97.4 F (36.3 C)   Ht 5\' 1"  (1.549 m)   Wt 120 lb 6.4 oz (54.6 kg)   LMP  (LMP Unknown)   SpO2 96%   BMI 22.75 kg/m    Physical Exam Vitals and nursing note reviewed.  Constitutional:      General: She is not in acute distress.    Appearance: Normal appearance.  HENT:     Head: Normocephalic.  Eyes:     Conjunctiva/sclera: Conjunctivae normal.  Cardiovascular:     Rate and Rhythm: Normal rate and regular rhythm.     Pulses: Normal  pulses.     Heart sounds: Normal heart sounds.  Pulmonary:     Effort: Pulmonary effort is normal.     Breath sounds: Normal breath sounds.  Musculoskeletal:     Cervical back: Normal range of motion.  Skin:    General: Skin is warm and dry.     Findings: No rash.  Neurological:     General: No focal deficit present.     Mental Status: She is alert and oriented to person, place, and time.  Psychiatric:        Mood and Affect: Mood normal.        Behavior: Behavior normal.        Thought Content: Thought content normal.        Judgment: Judgment normal.    The 10-year ASCVD risk score (Arnett DK, et al., 2019) is: 25.9%    Assessment & Plan:   Problem List Items Addressed This Visit       Cardiovascular and Mediastinum   Essential hypertension - Primary    Chronic, stable.  Continue metoprolol 25 mg twice daily.  Check CMP, CBC today.       Relevant Medications   rosuvastatin (CRESTOR) 10 MG tablet   Other Relevant Orders   CBC with Differential/Platelet   Comprehensive metabolic panel     Respiratory   COPD (chronic obstructive pulmonary disease) (HCC)    Chronic, stable. Continue albuterol as needed. She has not been able  to tolerate long acting inhalers in the past.       Relevant Medications   fluticasone (FLONASE) 50 MCG/ACT nasal spray   Lung nodule    She is currently following with pulmonology. She had a PET scan which showed that the nodule has decreased in size. She is still following with pulmonology and going to have repeat CT scan.         Other   Hyperlipidemia    Chronic, stable.  Continue rosuvastatin Monday and Friday.  Check CMP, CBC, lipid panel today. Follow- up in 6 months.       Relevant Medications   rosuvastatin (CRESTOR) 10 MG tablet   Other Relevant Orders   CBC with Differential/Platelet   Comprehensive metabolic panel   Lipid panel   Vitamin D deficiency    She is currently taking vitamin D 50,000 units weekly. Will check  vitamin D levels and adjust regimen based on results.       Relevant Orders   VITAMIN D 25 Hydroxy (Vit-D Deficiency, Fractures)   Pre-diabetes    Check A1c and treat based on results.       Relevant Orders   Hemoglobin A1c   Chronic pain syndrome    Chronic, stable. She is currently following with pain management and was started on hydrocodone 5-325mg  twice a day as needed. Continue collaboration and recommendations from pain management.       Other Visit Diagnoses     Fatigue, unspecified type       Check CMP, CBC, TSH, vitamin D today. Recommend regular sleep and exercise.   Relevant Orders   TSH   Immunization due       Flu vaccine given today   Relevant Orders   Flu Vaccine Trivalent High Dose (Fluad)       Return in about 6 months (around 06/24/2023) for chronic disease management .    Gerre Scull, NP

## 2022-12-25 NOTE — Assessment & Plan Note (Signed)
She is currently taking vitamin D 50,000 units weekly. Will check vitamin D levels and adjust regimen based on results.

## 2022-12-25 NOTE — Assessment & Plan Note (Signed)
Check A1c and treat based on results.

## 2022-12-25 NOTE — Patient Instructions (Signed)
It was great to see you!  We are checking your labs today and will let you know the results via mychart/phone.   Start using the aveeno lotion every day, especially after getting out of the shower   You can take hydroxyzine as needed for anxiety or itching.   Make sure you are drinking plenty of water  I have refilled your medications.   Let's follow-up in 6 months, sooner if you have concerns.  If a referral was placed today, you will be contacted for an appointment. Please note that routine referrals can sometimes take up to 3-4 weeks to process. Please call our office if you haven't heard anything after this time frame.  Take care,  Rodman Pickle, NP

## 2022-12-25 NOTE — Assessment & Plan Note (Addendum)
Chronic, stable. She is currently following with pain management and was started on hydrocodone 5-325mg  twice a day as needed. Continue collaboration and recommendations from pain management.

## 2022-12-26 ENCOUNTER — Ambulatory Visit: Payer: Medicare Other | Admitting: Emergency Medicine

## 2022-12-27 ENCOUNTER — Inpatient Hospital Stay (HOSPITAL_COMMUNITY): Payer: Medicare Other

## 2022-12-27 ENCOUNTER — Encounter (HOSPITAL_COMMUNITY): Payer: Self-pay | Admitting: Emergency Medicine

## 2022-12-27 ENCOUNTER — Inpatient Hospital Stay (HOSPITAL_COMMUNITY)
Admission: EM | Admit: 2022-12-27 | Discharge: 2022-12-29 | DRG: 388 | Disposition: A | Discharge status: Home / Self Care | Payer: Medicare Other | Attending: Internal Medicine | Admitting: Internal Medicine

## 2022-12-27 ENCOUNTER — Emergency Department (HOSPITAL_COMMUNITY): Payer: Medicare Other

## 2022-12-27 ENCOUNTER — Other Ambulatory Visit: Payer: Self-pay

## 2022-12-27 DIAGNOSIS — Z885 Allergy status to narcotic agent status: Secondary | ICD-10-CM | POA: Diagnosis not present

## 2022-12-27 DIAGNOSIS — Z7982 Long term (current) use of aspirin: Secondary | ICD-10-CM

## 2022-12-27 DIAGNOSIS — I5030 Unspecified diastolic (congestive) heart failure: Secondary | ICD-10-CM | POA: Diagnosis present

## 2022-12-27 DIAGNOSIS — F99 Mental disorder, not otherwise specified: Secondary | ICD-10-CM | POA: Diagnosis not present

## 2022-12-27 DIAGNOSIS — Z4682 Encounter for fitting and adjustment of non-vascular catheter: Secondary | ICD-10-CM | POA: Diagnosis not present

## 2022-12-27 DIAGNOSIS — G894 Chronic pain syndrome: Secondary | ICD-10-CM | POA: Diagnosis not present

## 2022-12-27 DIAGNOSIS — E559 Vitamin D deficiency, unspecified: Secondary | ICD-10-CM | POA: Diagnosis present

## 2022-12-27 DIAGNOSIS — I2510 Atherosclerotic heart disease of native coronary artery without angina pectoris: Secondary | ICD-10-CM | POA: Diagnosis not present

## 2022-12-27 DIAGNOSIS — Z8719 Personal history of other diseases of the digestive system: Secondary | ICD-10-CM

## 2022-12-27 DIAGNOSIS — R030 Elevated blood-pressure reading, without diagnosis of hypertension: Secondary | ICD-10-CM

## 2022-12-27 DIAGNOSIS — J9601 Acute respiratory failure with hypoxia: Secondary | ICD-10-CM

## 2022-12-27 DIAGNOSIS — Z808 Family history of malignant neoplasm of other organs or systems: Secondary | ICD-10-CM | POA: Diagnosis not present

## 2022-12-27 DIAGNOSIS — K6389 Other specified diseases of intestine: Secondary | ICD-10-CM | POA: Diagnosis not present

## 2022-12-27 DIAGNOSIS — K565 Intestinal adhesions [bands], unspecified as to partial versus complete obstruction: Principal | ICD-10-CM | POA: Diagnosis present

## 2022-12-27 DIAGNOSIS — I110 Hypertensive heart disease with heart failure: Secondary | ICD-10-CM | POA: Diagnosis not present

## 2022-12-27 DIAGNOSIS — Z9049 Acquired absence of other specified parts of digestive tract: Secondary | ICD-10-CM

## 2022-12-27 DIAGNOSIS — J440 Chronic obstructive pulmonary disease with acute lower respiratory infection: Secondary | ICD-10-CM | POA: Diagnosis present

## 2022-12-27 DIAGNOSIS — F17210 Nicotine dependence, cigarettes, uncomplicated: Secondary | ICD-10-CM | POA: Diagnosis not present

## 2022-12-27 DIAGNOSIS — K56609 Unspecified intestinal obstruction, unspecified as to partial versus complete obstruction: Principal | ICD-10-CM | POA: Diagnosis present

## 2022-12-27 DIAGNOSIS — Z90710 Acquired absence of both cervix and uterus: Secondary | ICD-10-CM

## 2022-12-27 DIAGNOSIS — K50012 Crohn's disease of small intestine with intestinal obstruction: Secondary | ICD-10-CM | POA: Diagnosis not present

## 2022-12-27 DIAGNOSIS — J189 Pneumonia, unspecified organism: Secondary | ICD-10-CM | POA: Diagnosis not present

## 2022-12-27 DIAGNOSIS — R911 Solitary pulmonary nodule: Secondary | ICD-10-CM | POA: Diagnosis present

## 2022-12-27 DIAGNOSIS — Z8249 Family history of ischemic heart disease and other diseases of the circulatory system: Secondary | ICD-10-CM

## 2022-12-27 DIAGNOSIS — K2900 Acute gastritis without bleeding: Secondary | ICD-10-CM | POA: Diagnosis not present

## 2022-12-27 DIAGNOSIS — E785 Hyperlipidemia, unspecified: Secondary | ICD-10-CM | POA: Diagnosis not present

## 2022-12-27 DIAGNOSIS — R7303 Prediabetes: Secondary | ICD-10-CM | POA: Diagnosis present

## 2022-12-27 DIAGNOSIS — J439 Emphysema, unspecified: Secondary | ICD-10-CM | POA: Diagnosis present

## 2022-12-27 DIAGNOSIS — I10 Essential (primary) hypertension: Secondary | ICD-10-CM | POA: Diagnosis not present

## 2022-12-27 DIAGNOSIS — R1084 Generalized abdominal pain: Secondary | ICD-10-CM | POA: Diagnosis not present

## 2022-12-27 DIAGNOSIS — F411 Generalized anxiety disorder: Secondary | ICD-10-CM

## 2022-12-27 DIAGNOSIS — I5032 Chronic diastolic (congestive) heart failure: Secondary | ICD-10-CM | POA: Diagnosis present

## 2022-12-27 DIAGNOSIS — I700 Atherosclerosis of aorta: Secondary | ICD-10-CM | POA: Diagnosis not present

## 2022-12-27 DIAGNOSIS — J449 Chronic obstructive pulmonary disease, unspecified: Secondary | ICD-10-CM | POA: Diagnosis present

## 2022-12-27 DIAGNOSIS — K5730 Diverticulosis of large intestine without perforation or abscess without bleeding: Secondary | ICD-10-CM | POA: Diagnosis not present

## 2022-12-27 DIAGNOSIS — K50019 Crohn's disease of small intestine with unspecified complications: Secondary | ICD-10-CM | POA: Diagnosis not present

## 2022-12-27 DIAGNOSIS — K50018 Crohn's disease of small intestine with other complication: Secondary | ICD-10-CM | POA: Diagnosis not present

## 2022-12-27 DIAGNOSIS — Z91041 Radiographic dye allergy status: Secondary | ICD-10-CM | POA: Diagnosis not present

## 2022-12-27 DIAGNOSIS — I252 Old myocardial infarction: Secondary | ICD-10-CM

## 2022-12-27 DIAGNOSIS — R61 Generalized hyperhidrosis: Secondary | ICD-10-CM | POA: Diagnosis not present

## 2022-12-27 DIAGNOSIS — Z841 Family history of disorders of kidney and ureter: Secondary | ICD-10-CM

## 2022-12-27 DIAGNOSIS — Z85038 Personal history of other malignant neoplasm of large intestine: Secondary | ICD-10-CM | POA: Diagnosis present

## 2022-12-27 DIAGNOSIS — Z800 Family history of malignant neoplasm of digestive organs: Secondary | ICD-10-CM

## 2022-12-27 DIAGNOSIS — Z79899 Other long term (current) drug therapy: Secondary | ICD-10-CM

## 2022-12-27 DIAGNOSIS — J168 Pneumonia due to other specified infectious organisms: Secondary | ICD-10-CM | POA: Diagnosis not present

## 2022-12-27 DIAGNOSIS — Z8679 Personal history of other diseases of the circulatory system: Secondary | ICD-10-CM

## 2022-12-27 HISTORY — DX: Acute respiratory failure with hypoxia: J96.01

## 2022-12-27 LAB — URINALYSIS, ROUTINE W REFLEX MICROSCOPIC
Bilirubin Urine: NEGATIVE
Glucose, UA: NEGATIVE mg/dL
Ketones, ur: NEGATIVE mg/dL
Nitrite: NEGATIVE
Protein, ur: NEGATIVE mg/dL
Specific Gravity, Urine: 1.021 (ref 1.005–1.030)
pH: 5 (ref 5.0–8.0)

## 2022-12-27 LAB — CBC WITH DIFFERENTIAL/PLATELET
Abs Immature Granulocytes: 0.15 10*3/uL — ABNORMAL HIGH (ref 0.00–0.07)
Basophils Absolute: 0.1 10*3/uL (ref 0.0–0.1)
Basophils Relative: 0 %
Eosinophils Absolute: 0.1 10*3/uL (ref 0.0–0.5)
Eosinophils Relative: 1 %
HCT: 45.9 % (ref 36.0–46.0)
Hemoglobin: 14.5 g/dL (ref 12.0–15.0)
Immature Granulocytes: 1 %
Lymphocytes Relative: 15 %
Lymphs Abs: 2 10*3/uL (ref 0.7–4.0)
MCH: 31.4 pg (ref 26.0–34.0)
MCHC: 31.6 g/dL (ref 30.0–36.0)
MCV: 99.4 fL (ref 80.0–100.0)
Monocytes Absolute: 1 10*3/uL (ref 0.1–1.0)
Monocytes Relative: 7 %
Neutro Abs: 10.1 10*3/uL — ABNORMAL HIGH (ref 1.7–7.7)
Neutrophils Relative %: 76 %
Platelets: 245 10*3/uL (ref 150–400)
RBC: 4.62 MIL/uL (ref 3.87–5.11)
RDW: 12.9 % (ref 11.5–15.5)
WBC: 13.4 10*3/uL — ABNORMAL HIGH (ref 4.0–10.5)
nRBC: 0 % (ref 0.0–0.2)

## 2022-12-27 LAB — COMPREHENSIVE METABOLIC PANEL
ALT: 19 U/L (ref 0–44)
AST: 22 U/L (ref 15–41)
Albumin: 3.5 g/dL (ref 3.5–5.0)
Alkaline Phosphatase: 49 U/L (ref 38–126)
Anion gap: 8 (ref 5–15)
BUN: 16 mg/dL (ref 8–23)
CO2: 24 mmol/L (ref 22–32)
Calcium: 8.3 mg/dL — ABNORMAL LOW (ref 8.9–10.3)
Chloride: 107 mmol/L (ref 98–111)
Creatinine, Ser: 0.56 mg/dL (ref 0.44–1.00)
GFR, Estimated: >60 mL/min (ref >60)
Glucose, Bld: 97 mg/dL (ref 70–99)
Potassium: 4.1 mmol/L (ref 3.5–5.1)
Sodium: 139 mmol/L (ref 135–145)
Total Bilirubin: 0.8 mg/dL (ref 0.3–1.2)
Total Protein: 6.3 g/dL — ABNORMAL LOW (ref 6.5–8.1)

## 2022-12-27 LAB — PROCALCITONIN: Procalcitonin: <0.1 ng/mL

## 2022-12-27 LAB — LIPASE, BLOOD: Lipase: 23 U/L (ref 11–51)

## 2022-12-27 MED ORDER — SODIUM CHLORIDE 0.9 % IV SOLN: 1.0000 g | INTRAVENOUS | Status: DC

## 2022-12-27 MED ORDER — IPRATROPIUM-ALBUTEROL 0.5-2.5 (3) MG/3ML IN SOLN: 3.0000 mL | RESPIRATORY_TRACT | Status: DC | PRN

## 2022-12-27 MED ORDER — HYDROMORPHONE HCL 1 MG/ML IJ SOLN: 1.0000 mg | INTRAMUSCULAR | Status: DC | PRN

## 2022-12-27 MED ORDER — ONDANSETRON HCL 4 MG/2ML IJ SOLN: 4.0000 mg | Freq: Four times a day (QID) | INTRAMUSCULAR | Status: DC | PRN

## 2022-12-27 MED ORDER — SODIUM CHLORIDE 0.9 % IV SOLN
100.0000 mg | Freq: Once | INTRAVENOUS | Status: AC
  Administered 2022-12-27: 100 mg via INTRAVENOUS
  Filled 2022-12-27: qty 100

## 2022-12-27 MED ORDER — FENTANYL CITRATE PF 50 MCG/ML IJ SOSY
25.0000 ug | PREFILLED_SYRINGE | Freq: Once | INTRAMUSCULAR | Status: AC
  Administered 2022-12-27: 25 ug via INTRAVENOUS
  Filled 2022-12-27: qty 1

## 2022-12-27 MED ORDER — SODIUM CHLORIDE 0.9 % IV SOLN: 250.0000 mL | INTRAVENOUS | Status: DC | PRN

## 2022-12-27 MED ORDER — ONDANSETRON HCL 4 MG/2ML IJ SOLN
4.0000 mg | Freq: Once | INTRAMUSCULAR | Status: AC
  Administered 2022-12-27: 4 mg via INTRAVENOUS
  Filled 2022-12-27: qty 2

## 2022-12-27 MED ORDER — SODIUM CHLORIDE 0.9 % IV SOLN
1.0000 g | Freq: Once | INTRAVENOUS | Status: AC
  Administered 2022-12-27: 1 g via INTRAVENOUS
  Filled 2022-12-27: qty 10

## 2022-12-27 MED ORDER — HYDROMORPHONE HCL 1 MG/ML IJ SOLN
0.5000 mg | INTRAMUSCULAR | Status: DC | PRN
  Administered 2022-12-27: 1 mg via INTRAVENOUS
  Filled 2022-12-27: qty 1

## 2022-12-27 MED ORDER — FENTANYL CITRATE PF 50 MCG/ML IJ SOSY
50.0000 ug | PREFILLED_SYRINGE | Freq: Once | INTRAMUSCULAR | Status: AC
  Administered 2022-12-27: 50 ug via INTRAVENOUS
  Filled 2022-12-27: qty 1

## 2022-12-27 MED ORDER — SODIUM CHLORIDE 0.9 % IV BOLUS
1000.0000 mL | Freq: Once | INTRAVENOUS | Status: AC
  Administered 2022-12-27: 1000 mL via INTRAVENOUS

## 2022-12-27 MED ORDER — PANTOPRAZOLE SODIUM 40 MG IV SOLR
40.0000 mg | INTRAVENOUS | Status: DC
  Administered 2022-12-27 – 2022-12-28 (×2): 40 mg via INTRAVENOUS
  Filled 2022-12-27 (×2): qty 10

## 2022-12-27 MED ORDER — DIATRIZOATE MEGLUMINE & SODIUM 66-10 % PO SOLN
90.0000 mL | Freq: Once | ORAL | Status: AC
  Administered 2022-12-27: 90 mL via NASOGASTRIC
  Filled 2022-12-27: qty 90

## 2022-12-27 MED ORDER — ONDANSETRON HCL 4 MG PO TABS: 4.0000 mg | ORAL_TABLET | Freq: Four times a day (QID) | ORAL | Status: DC | PRN

## 2022-12-27 MED ORDER — ACETAMINOPHEN 650 MG RE SUPP: 650.0000 mg | Freq: Four times a day (QID) | RECTAL | Status: DC | PRN

## 2022-12-27 MED ORDER — FENTANYL CITRATE PF 50 MCG/ML IJ SOSY: 25.0000 ug | PREFILLED_SYRINGE | Freq: Once | INTRAMUSCULAR | Status: DC

## 2022-12-27 MED ORDER — SODIUM CHLORIDE 0.9 % IV SOLN
100.0000 mg | Freq: Two times a day (BID) | INTRAVENOUS | Status: DC
  Administered 2022-12-28 – 2022-12-29 (×3): 100 mg via INTRAVENOUS
  Filled 2022-12-27 (×5): qty 100

## 2022-12-27 MED ORDER — ACETAMINOPHEN 325 MG PO TABS: 650.0000 mg | ORAL_TABLET | Freq: Four times a day (QID) | ORAL | Status: DC | PRN

## 2022-12-27 MED ORDER — GUAIFENESIN ER 600 MG PO TB12
600.0000 mg | ORAL_TABLET | Freq: Two times a day (BID) | ORAL | Status: DC
  Administered 2022-12-27 – 2022-12-28 (×3): 600 mg via ORAL
  Filled 2022-12-27 (×3): qty 1

## 2022-12-27 MED ORDER — SODIUM CHLORIDE 0.9% FLUSH: 3.0000 mL | INTRAVENOUS | Status: DC | PRN

## 2022-12-27 MED ORDER — SODIUM CHLORIDE 0.9% FLUSH
3.0000 mL | Freq: Two times a day (BID) | INTRAVENOUS | Status: DC
  Administered 2022-12-27 – 2022-12-29 (×4): 3 mL via INTRAVENOUS

## 2022-12-27 MED ORDER — HYDROMORPHONE HCL 1 MG/ML IJ SOLN
0.5000 mg | Freq: Once | INTRAMUSCULAR | Status: AC
  Administered 2022-12-27: 0.5 mg via INTRAVENOUS
  Filled 2022-12-27: qty 1

## 2022-12-27 MED ORDER — HYDRALAZINE HCL 20 MG/ML IJ SOLN: 10.0000 mg | Freq: Four times a day (QID) | INTRAMUSCULAR | Status: DC | PRN

## 2022-12-27 MED ORDER — HYDROMORPHONE HCL 1 MG/ML IJ SOLN
1.0000 mg | INTRAMUSCULAR | Status: DC | PRN
  Administered 2022-12-27 – 2022-12-29 (×9): 1 mg via INTRAVENOUS
  Filled 2022-12-27 (×9): qty 1

## 2022-12-27 MED ORDER — LACTATED RINGERS IV SOLN: INTRAVENOUS | Status: AC

## 2022-12-27 MED ORDER — SODIUM CHLORIDE 0.9 % IV SOLN
1.0000 g | INTRAVENOUS | Status: DC
  Administered 2022-12-28: 1 g via INTRAVENOUS
  Filled 2022-12-27 (×2): qty 10

## 2022-12-27 NOTE — ED Triage Notes (Signed)
Patient presents due to sharp, left abdominal pain which started around 1000 this am. When EMS arrived they noted that pain was diaphoretic and attempting to find a position of comfort. Nothing helped the patient's pain at home, but after 100 mcg of Fentanyl, the pain decreased from an 8/10 to a 7/10.      HX: obstruction (surgery)  EMS vitals: 99% SPO2 on room air 58 HR 140 systolic BP 145 CBG

## 2022-12-27 NOTE — Progress Notes (Signed)
  Patient reported constant abdominal pain.  She has history of chronic pain syndrome takes Norco 5 mg - 325 mg twice daily as needed at home.  While patient in the ED around 4:30 PM received fentanyl 50 mcg once and Dilaudid 0.5 mg once.  I have ordered Dilaudid 1 mg every 2 hours as needed for moderate/severe pain for pain scale above 4 and also for breakthrough pain.  Patient received 1 mg of Dilaudid still complaining about abdominal pain and asking for something else.  Patient reported allergy to morphine as it causes her nausea. Giving patient 1 dose of fentanyl 25 mcg at 9:25 PM 12/27/22.   Tereasa Coop, MD [facility] 12/27/2022, 9:25 PM

## 2022-12-27 NOTE — ED Provider Notes (Signed)
[place] EMERGENCY DEPARTMENT AT [facility] Provider Note   CSN: [id number] Arrival date & time: 12/27/22  1609     History  Chief Complaint  Patient presents with   Abdominal Pain    Erica [name] is a 72 y.o. female.  72 year old female with past medical history of COPD, coronary artery disease, hypertension, and hyperlipidemia as well as recurrent bowel obstructions in the past presenting to the emergency department today with abdominal pain.  The patient states that this began earlier today.  She reports that it has been waxing and waning in intensity.  She states that she did try some Pepto-Bismol and tried to have a bowel movement and was unsuccessful.  She reports she has not really passed flatus today.  She does report nausea without any vomiting.  Denies any fevers.  She states this does feel similar to when she has had bowel obstructions in the past.  She states that she has not had anything to eat or drink today and is feeling generally weak.   Abdominal Pain Associated symptoms: nausea        Home Medications Prior to Admission medications   Medication Sig Start Date End Date Taking? Authorizing Provider  albuterol (PROVENTIL) (2.5 MG/3ML) 0.083% nebulizer solution Take 3 mLs (2.5 mg total) by nebulization every 6 (six) hours as needed for wheezing or shortness of breath. 03/23/22   McElwee, [name], NP  albuterol (VENTOLIN HFA) 108 (90 Base) MCG/ACT inhaler INHALE 2 PUFFS BY MOUTH EVERY 6 HOURS AS NEEDED FOR WHEEZING FOR SHORTNESS OF BREATH 10/17/22   McElwee, [name], NP  aspirin EC 81 MG tablet Take 81 mg by mouth daily.    [provider]  cyclobenzaprine (FLEXERIL) 10 MG tablet Take 1 tablet by mouth three times daily as needed for muscle spasm 11/21/22   McElwee, [name], NP  fluticasone (FLONASE) 50 MCG/ACT nasal spray Place 2 sprays into both nostrils daily. 12/25/22   McElwee, Jake Church, NP  HYDROcodone-acetaminophen (NORCO) 5-325 MG  tablet Take 1 tablet by mouth 2 (two) times daily as needed for severe pain. 12/01/22 12/31/22  Angelina Sheriff, DO  HYDROcodone-acetaminophen (NORCO) 5-325 MG tablet Take 1 tablet by mouth 2 (two) times daily as needed for severe pain. 12/30/22 01/29/23  Angelina Sheriff, DO  HYDROcodone-acetaminophen (NORCO) 5-325 MG tablet Take 1 tablet by mouth 2 (two) times daily as needed for severe pain. 01/29/23 02/28/23  Angelina Sheriff, DO  hydrOXYzine (ATARAX) 10 MG tablet Take 1 tablet (10 mg total) by mouth 3 (three) times daily as needed for anxiety. 08/21/22   McElwee, [name], NP  metoprolol tartrate (LOPRESSOR) 25 MG tablet TAKE 1 TABLET BY MOUTH TWICE DAILY --PLEASE  CALL  OUR  OFFICE  TO  SCHEDULE  AN  APPT  FOR  FUTURE  REFILLS  --2ND  ATTEMPT 11/22/22   Tereso Newcomer T, PA-C  rosuvastatin (CRESTOR) 10 MG tablet Take 1 tablet (10 mg total) by mouth 2 (two) times a week. Patient takes 10mg  on Mondays and 10mg  on Fridays. 12/25/22   McElwee, Jake Church, NP  Vitamin D, Ergocalciferol, (DRISDOL) 1.25 MG (50000 UNIT) CAPS capsule Take 1 capsule by mouth once a week 11/21/22   McElwee, [name], NP      Allergies    Iodinated contrast media and Morphine    Review of Systems   Review of Systems  Gastrointestinal:  Positive for abdominal pain and nausea.  All other systems reviewed and are  negative.   Physical Exam Updated Vital Signs BP (!) 154/93   Pulse 83   Temp 97.6 F (36.4 C) (Oral)   Resp 19   LMP  (LMP Unknown)   SpO2 (!) 89%  Physical Exam Vitals and nursing note reviewed.   Gen: Appears uncomfortable Eyes: PERRL, EOMI HEENT: no oropharyngeal swelling, dry mucous membranes Neck: trachea midline Resp: clear to auscultation bilaterally Card: RRR, no murmurs, rubs, or gallops Abd: Fusilli tender with maximal tenderness over the left periumbilical region with guarding to palpation Extremities: no calf tenderness, no edema Vascular: 2+ radial pulses bilaterally, 2+ DP pulses  bilaterally Skin: no rashes Psyc: acting appropriately   ED Results / Procedures / Treatments   Labs (all labs ordered are listed, but only abnormal results are displayed) Labs Reviewed  CBC WITH DIFFERENTIAL/PLATELET - Abnormal; Notable for the following components:      Result Value   WBC 13.4 (*)    Neutro Abs 10.1 (*)    Abs Immature Granulocytes 0.15 (*)    All other components within normal limits  COMPREHENSIVE METABOLIC PANEL - Abnormal; Notable for the following components:   Calcium 8.3 (*)    Total Protein 6.3 (*)    All other components within normal limits  URINALYSIS, ROUTINE W REFLEX MICROSCOPIC - Abnormal; Notable for the following components:   Hgb urine dipstick MODERATE (*)    Leukocytes,Ua TRACE (*)    Bacteria, UA MANY (*)    All other components within normal limits  CULTURE, BLOOD (ROUTINE X 2)  CULTURE, BLOOD (ROUTINE X 2)  RESPIRATORY PANEL BY PCR  EXPECTORATED SPUTUM ASSESSMENT W GRAM STAIN, RFLX TO RESP C  LIPASE, BLOOD  PROCALCITONIN  LEGIONELLA PNEUMOPHILA SEROGP 1 UR AG  STREP PNEUMONIAE URINARY ANTIGEN  CBC  COMPREHENSIVE METABOLIC PANEL    EKG None  Radiology DG Abd Portable 1V-Small Bowel Protocol-Position Verification  Result Date: 12/27/2022 CLINICAL DATA:  NG tube placement. EXAM: PORTABLE ABDOMEN - 1 VIEW COMPARISON:  12/27/2022. FINDINGS: The bowel gas pattern is normal. An enteric tube terminates in the stomach and contrast is present at the gastric fundus. Surgical clips are noted in the right upper quadrant. There is atherosclerotic calcification of the aorta. No radio-opaque calculi or other acute radiographic abnormality are seen. IMPRESSION: Enteric tube terminates in the stomach and contrast is present at the gastric fundus. Electronically Signed   By: Thornell Sartorius M.D.   On: 12/27/2022 22:52   CT ABDOMEN PELVIS WO CONTRAST  Result Date: 12/27/2022 CLINICAL DATA:  Acute onset left abdominal pain EXAM: CT ABDOMEN AND PELVIS  WITHOUT CONTRAST TECHNIQUE: Multidetector CT imaging of the abdomen and pelvis was performed following the standard protocol without IV contrast. RADIATION DOSE REDUCTION: This exam was performed according to the departmental dose-optimization program which includes automated exposure control, adjustment of the mA and/or kV according to patient size and/or use of iterative reconstruction technique. COMPARISON:  CT chest dated 08/08/2022, CT abdomen and pelvis dated 04/24/2018 FINDINGS: Lower chest: Bibasilar subsegmental atelectasis. Spiculated right lower lobe nodule is decreased in size, measuring 10 x 8 mm (5:10), previously 15 x 10 mm. Additional right lower lobe tree-in-bud nodules. Right lower lobe subsegmental mucous plugging. No pleural effusion or pneumothorax demonstrated. Partially imaged heart size is normal. Hepatobiliary: No focal hepatic lesions. No intra or extrahepatic biliary ductal dilation. Cholecystectomy. Pancreas: No focal lesions or main ductal dilation. Spleen: Normal in size without focal abnormality. Adrenals/Urinary Tract: Unchanged 2.4 cm left adrenal nodule measures 0 HU in  keeping with adenoma. No specific follow-up imaging recommended. No right adrenal nodule. No suspicious renal mass, calculi or hydronephrosis. No focal bladder wall thickening. Bladder contains a single focus of intraluminal gas. Stomach/Bowel: Mural thickening of the gastric antrum. Ileocolic anastomosis. Diffuse small bowel dilation the small bowel immediately proximal to the ileocolonic anastomosis appears decompressed. A few loops in the right lower quadrant demonstrate fecalization of intraluminal contents. Colonic diverticulosis without acute diverticulitis. Vascular/Lymphatic: Aortic atherosclerosis. No enlarged abdominal or pelvic lymph nodes. Reproductive: No adnexal masses. Other: Small volume free fluid.  No fluid collection or free air. Musculoskeletal: No acute or abnormal lytic or blastic osseous  lesions. Multilevel degenerative changes of the partially imaged thoracic and lumbar spine. Bilateral gluteal subcutaneous granulomas. IMPRESSION: 1. Diffuse small bowel dilation with fecalization of intraluminal contents in the right lower quadrant. While a transition point is not confidently identified on this noncontrast-enhanced examination, given history of prior bowel obstruction and surgery, findings are again suspicious for small-bowel obstruction. 2. Mural thickening of the gastric antrum, which may be seen in the setting of gastritis. 3. Spiculated right lower lobe nodule is decreased in size, measuring 10 x 8 mm, previously 15 x 10 mm. Additional right lower lobe tree-in-bud nodules and subsegmental mucous plugging, likely infectious/inflammatory. 4. Colonic diverticulosis without acute diverticulitis. 5. Single focus of intraluminal gas in the bladder. Recommend correlation with recent instrumentation. 6.  Aortic Atherosclerosis (ICD10-I70.0). Electronically Signed   By: Agustin Cree M.D.   On: 12/27/2022 18:54    Procedures Procedures    Medications Ordered in ED Medications  sodium chloride flush (NS) 0.9 % injection 3 mL (3 mLs Intravenous Given 12/27/22 2330)  sodium chloride flush (NS) 0.9 % injection 3 mL (has no administration in time range)  0.9 %  sodium chloride infusion (has no administration in time range)  lactated ringers infusion ( Intravenous New Bag/Given 12/27/22 2040)  acetaminophen (TYLENOL) tablet 650 mg (has no administration in time range)    Or  acetaminophen (TYLENOL) suppository 650 mg (has no administration in time range)  hydrALAZINE (APRESOLINE) injection 10 mg (has no administration in time range)  doxycycline (VIBRAMYCIN) 100 mg in sodium chloride 0.9 % 250 mL IVPB (has no administration in time range)  cefTRIAXone (ROCEPHIN) 1 g in sodium chloride 0.9 % 100 mL IVPB (has no administration in time range)  ipratropium-albuterol (DUONEB) 0.5-2.5 (3) MG/3ML  nebulizer solution 3 mL (has no administration in time range)  HYDROmorphone (DILAUDID) injection 1 mg (1 mg Intravenous Given 12/27/22 2229)  guaiFENesin (MUCINEX) 12 hr tablet 600 mg (600 mg Oral Given 12/27/22 2329)  pantoprazole (PROTONIX) injection 40 mg (40 mg Intravenous Given 12/27/22 2328)  ondansetron (ZOFRAN) injection 4 mg (has no administration in time range)  fentaNYL (SUBLIMAZE) injection 50 mcg (50 mcg Intravenous Given 12/27/22 1643)  ondansetron (ZOFRAN) injection 4 mg (4 mg Intravenous Given 12/27/22 1643)  sodium chloride 0.9 % bolus 1,000 mL (0 mLs Intravenous Stopped 12/27/22 1958)  HYDROmorphone (DILAUDID) injection 0.5 mg (0.5 mg Intravenous Given 12/27/22 1831)  cefTRIAXone (ROCEPHIN) 1 g in sodium chloride 0.9 % 100 mL IVPB (0 g Intravenous Stopped 12/27/22 2049)  doxycycline (VIBRAMYCIN) 100 mg in sodium chloride 0.9 % 250 mL IVPB (0 mg Intravenous Stopped 12/27/22 2258)  diatrizoate meglumine-sodium (GASTROGRAFIN) 66-10 % solution 90 mL (90 mLs Per NG tube Given 12/27/22 2130)  fentaNYL (SUBLIMAZE) injection 25 mcg (25 mcg Intravenous Given 12/27/22 2131)    ED Course/ Medical Decision Making/ A&P  Medical Decision Making 72 year old female with past medical history of hypertension, hyperlipidemia, COPD, coronary artery disease presenting to the emergency department today with abdominal pain.  I will further evaluate patient here with basic labs including LFTs and a lipase to evaluate for hepatobiliary pathology or pancreatitis.  I will obtain a CT scan of her abdomen without contrast that she has a contrast allergy to evaluate for obstruction.  Will give the patient fentanyl and Zofran for symptoms as well as IV fluids does appear very dry here on exam.  I will reevaluate for ultimate disposition.  Patient was found to have a bowel obstruction.  I did call and discuss her case with surgery.  They recommended NG tube and admission.  I did discuss  patient's case with the hospitalist service.  He is admitted for further evaluation management.  Amount and/or Complexity of Data Reviewed Labs: ordered. Radiology: ordered.  Risk Prescription drug management. Decision regarding hospitalization.           Final Clinical Impression(s) / ED Diagnoses Final diagnoses:  Small bowel obstruction (HCC)  Pneumonia due to infectious organism, unspecified laterality, unspecified part of lung    Rx / DC Orders ED Discharge Orders     None         Durwin Glaze, MD 12/28/22 0003

## 2022-12-27 NOTE — Progress Notes (Signed)
  Obtaining EKG to evaluate if patient has any QT prolongation or not as I am treating patient with Zofran as needed for nausea in the context of SBO.  Tereasa Coop, MD [facility] 12/27/2022, 9:15 PM

## 2022-12-27 NOTE — ED Notes (Signed)
.ED TO INPATIENT HANDOFF REPORT  ED Nurse Name and Phone #: Lyndel Safe Name/Age/Gender Erica [name] 72 y.o. female Room/Bed: WA25/WA25  Code Status   Code Status: Full Code  Home/SNF/Other Home Patient oriented to: self, place, time, and situation Is this baseline? Yes   Triage Complete: Triage complete  Chief Complaint SBO (small bowel obstruction) (HCC) [K56.609]  Triage Note Patient presents due to sharp, left abdominal pain which started around 1000 this am. When EMS arrived they noted that pain was diaphoretic and attempting to find a position of comfort. Nothing helped the patient's pain at home, but after 100 mcg of Fentanyl, the pain decreased from an 8/10 to a 7/10.      HX: obstruction (surgery)  EMS vitals: 99% SPO2 on room air 58 HR 140 systolic BP 145 CBG     Allergies Allergies  Allergen Reactions   Iodinated Contrast Media Itching    She had some itching after lumbar epidural injection in ipsilateral lower extremity 12/07/22.  Today she had itching in her ipsilateral upper extremity after a cervical epidural injection.   Morphine Nausea And Vomiting    Level of Care/Admitting Diagnosis ED Disposition     ED Disposition  Admit   Condition  --   Comment  Hospital Area: [facility] [[id number]]  Level of Care: Telemetry [5]  Admit to tele based on following criteria: Other see comments  Comments: Elevate blood pressure  May admit patient to [facility] or [facility] if equivalent level of care is available:: No  Covid Evaluation: Symptomatic Person Under Investigation (PUI) or recent exposure (last 10 days) *Testing Required*  Diagnosis: SBO (small bowel obstruction) [facility]) [[id number]]  Admitting Physician: Tereasa Coop [[id number]]  Attending Physician: Tereasa Coop [[id number]]  Certification:: I certify this patient will need inpatient services for at least 2 midnights  Expected Medical Readiness: 01/01/2023           B Medical/Surgery History Past Medical History:  Diagnosis Date   Acute duodenitis 04/24/2017   Allergy    ANEMIA    Anxiety    BACK PAIN, LUMBAR    Cancer of ascending colon [id number] s/p colectomy 03/05/2009 02/18/2010   Qualifier: Diagnosis of  By: Leone Payor MD, Alfonse Ras E    CHF (congestive heart failure) (HCC)    Complete rotator cuff tear of left shoulder 11/27/2013   Ultrasound guided injection on November 27, 2013    COPD (chronic obstructive pulmonary disease) with emphysema (HCC)    COPD exacerbation (HCC) 11/14/2017   Crohn's 02/2010   ileal ulcers, intol of entercort--refuses treatment   Emphysema of lung (HCC)    GERD    History of blood transfusion    w/ c/s surgery   History of transfusion of whole blood    HYPERLIPIDEMIA    HYPERTENSION    Microscopic hematuria    chronic   Myocardial infarction (HCC) 2010   Narcotic dependence (HCC) 05/25/2017   Obstruction of intestine or colon (HCC)    adhesions   OSTEOARTHRITIS    Rectal fissure    Possible fissure   Rotator cuff tear    Takotsubo syndrome 12/2008   URINARY INCONTINENCE    Past Surgical History:  Procedure Laterality Date   ABDOMINAL HYSTERECTOMY  1994   APPENDECTOMY  1964   BLADDER SUSPENSION     CESAREAN SECTION     x 3   CHOLECYSTECTOMY  1995   COLONOSCOPY  2017   COLONOSCOPY W/ BIOPSIES AND  POLYPECTOMY  01/24/2011   (Crohn's)ileitis, internal hemorrhoids   ECTOPIC PREGNANCY SURGERY     ESOPHAGOGASTRODUODENOSCOPY     FACIAL COSMETIC SURGERY     HAND SURGERY Bilateral 1991   x 2   KNEE SURGERY Right 1981   LYSIS OF ADHESION  2010   ex lap/LOA for SBO Dr Johna Sheriff   ORBITAL FRACTURE SURGERY  1990   RIGHT COLECTOMY  03/2009   Right colectomy for colon CA.  Dr Dwain Sarna   TONSILLECTOMY  1952   TOTAL KNEE ARTHROPLASTY  03/2010   Dr Charlann Boxer.  Depuy   UPPER GASTROINTESTINAL ENDOSCOPY  05/16/2010   normal   UTERINE SUSPENSION     VIDEO BRONCHOSCOPY Bilateral 12/18/2014   Procedure: VIDEO  BRONCHOSCOPY WITHOUT FLUORO;  Surgeon: Nyoka Cowden, MD;  Location: [facility];  Service: Cardiopulmonary;  Laterality: Bilateral;     A IV Location/Drains/Wounds Patient Lines/Drains/Airways Status     Active Line/Drains/Airways     Name Placement date Placement time Site Days   Peripheral IV 12/27/22 18 G Anterior;Left Forearm 12/27/22  1633  Forearm  less than 1   Peripheral IV 12/27/22 20 G Right Antecubital 12/27/22  2038  Antecubital  less than 1            Intake/Output Last 24 hours  Intake/Output Summary (Last 24 hours) at 12/27/2022 2331 Last data filed at 12/27/2022 2258 Gross per 24 hour  Intake 250 ml  Output --  Net 250 ml    Labs/Imaging Results for orders placed or performed during the hospital encounter of 12/27/22 (from the past 48 hour(s))  CBC with Differential     Status: Abnormal   Collection Time: 12/27/22  4:27 PM  Result Value Ref Range   WBC 13.4 (H) 4.0 - 10.5 K/uL   RBC 4.62 3.87 - 5.11 MIL/uL   Hemoglobin 14.5 12.0 - 15.0 g/dL   HCT 16.1 09.6 - 04.5 %   MCV 99.4 80.0 - 100.0 fL   MCH 31.4 26.0 - 34.0 pg   MCHC 31.6 30.0 - 36.0 g/dL   RDW 40.9 81.1 - 91.4 %   Platelets 245 150 - 400 K/uL   nRBC 0.0 0.0 - 0.2 %   Neutrophils Relative % 76 %   Neutro Abs 10.1 (H) 1.7 - 7.7 K/uL   Lymphocytes Relative 15 %   Lymphs Abs 2.0 0.7 - 4.0 K/uL   Monocytes Relative 7 %   Monocytes Absolute 1.0 0.1 - 1.0 K/uL   Eosinophils Relative 1 %   Eosinophils Absolute 0.1 0.0 - 0.5 K/uL   Basophils Relative 0 %   Basophils Absolute 0.1 0.0 - 0.1 K/uL   Immature Granulocytes 1 %   Abs Immature Granulocytes 0.15 (H) 0.00 - 0.07 K/uL    Comment: Performed at [facility], [address], [place], [place] [address]  Comprehensive metabolic panel     Status: Abnormal   Collection Time: 12/27/22  4:27 PM  Result Value Ref Range   Sodium 139 135 - 145 mmol/L   Potassium 4.1 3.5 - 5.1 mmol/L    Comment: HEMOLYSIS AT THIS LEVEL MAY  AFFECT RESULT   Chloride 107 98 - 111 mmol/L   CO2 24 22 - 32 mmol/L   Glucose, Bld 97 70 - 99 mg/dL    Comment: Glucose reference range applies only to samples taken after fasting for at least 8 hours.   BUN 16 8 - 23 mg/dL   Creatinine, Ser 6.21 0.44 - 1.00  mg/dL   Calcium 8.3 (L) 8.9 - 10.3 mg/dL   Total Protein 6.3 (L) 6.5 - 8.1 g/dL   Albumin 3.5 3.5 - 5.0 g/dL   AST 22 15 - 41 U/L    Comment: HEMOLYSIS AT THIS LEVEL MAY AFFECT RESULT   ALT 19 0 - 44 U/L    Comment: HEMOLYSIS AT THIS LEVEL MAY AFFECT RESULT   Alkaline Phosphatase 49 38 - 126 U/L   Total Bilirubin 0.8 0.3 - 1.2 mg/dL    Comment: HEMOLYSIS AT THIS LEVEL MAY AFFECT RESULT   GFR, Estimated >60 >60 mL/min    Comment: (NOTE) Calculated using the CKD-EPI Creatinine Equation (2021)    Anion gap 8 5 - 15    Comment: Performed at [facility], [address] [place], [place] [address]  Lipase, blood     Status: None   Collection Time: 12/27/22  4:27 PM  Result Value Ref Range   Lipase 23 11 - 51 U/L    Comment: Performed at [facility], [address], [place], [place] [address]  Procalcitonin     Status: None   Collection Time: 12/27/22  4:27 PM  Result Value Ref Range   Procalcitonin <0.10 ng/mL    Comment:        Interpretation: PCT (Procalcitonin) <= 0.5 ng/mL: Systemic infection (sepsis) is not likely. Local bacterial infection is possible. (NOTE)       Sepsis PCT Algorithm           Lower Respiratory Tract                                      Infection PCT Algorithm    ----------------------------     ----------------------------         PCT < 0.25 ng/mL                PCT < 0.10 ng/mL          Strongly encourage             Strongly discourage   discontinuation of antibiotics    initiation of antibiotics    ----------------------------     -----------------------------       PCT 0.25 - 0.50 ng/mL            PCT 0.10 - 0.25 ng/mL               OR       >80%  decrease in PCT            Discourage initiation of                                            antibiotics      Encourage discontinuation           of antibiotics    ----------------------------     -----------------------------         PCT >= 0.50 ng/mL              PCT 0.26 - 0.50 ng/mL               AND        <80% decrease in PCT             Encourage initiation of  antibiotics       Encourage continuation           of antibiotics    ----------------------------     -----------------------------        PCT >= 0.50 ng/mL                  PCT > 0.50 ng/mL               AND         increase in PCT                  Strongly encourage                                      initiation of antibiotics    Strongly encourage escalation           of antibiotics                                     -----------------------------                                           PCT <= 0.25 ng/mL                                                 OR                                        > 80% decrease in PCT                                      Discontinue / Do not initiate                                             antibiotics  Performed at [facility], [address], [place], [place] [address]   Urinalysis, Routine w reflex microscopic -Urine, Clean Catch     Status: Abnormal   Collection Time: 12/27/22  6:17 PM  Result Value Ref Range   Color, Urine YELLOW YELLOW   APPearance CLEAR CLEAR   Specific Gravity, Urine 1.021 1.005 - 1.030   pH 5.0 5.0 - 8.0   Glucose, UA NEGATIVE NEGATIVE mg/dL   Hgb urine dipstick MODERATE (A) NEGATIVE   Bilirubin Urine NEGATIVE NEGATIVE   Ketones, ur NEGATIVE NEGATIVE mg/dL   Protein, ur NEGATIVE NEGATIVE mg/dL   Nitrite NEGATIVE NEGATIVE   Leukocytes,Ua TRACE (A) NEGATIVE   RBC / HPF 6-10 0 - 5 RBC/hpf   WBC, UA 0-5 0 - 5 WBC/hpf   Bacteria, UA MANY (A) NONE SEEN   Squamous Epithelial / HPF 0-5 0  - 5 /HPF   Mucus PRESENT    Hyaline Casts, UA PRESENT    Ca Oxalate Crys,  UA PRESENT     Comment: Performed at [facility], [address] [place], [place] [address]  Culture, blood (Routine X 2) w Reflex to ID Panel     Status: None (Preliminary result)   Collection Time: 12/27/22  8:43 PM   Specimen: BLOOD LEFT ARM  Result Value Ref Range   Specimen Description      BLOOD LEFT ARM Performed at [facility], [address] [place], [place] [address]    Special Requests      BOTTLES DRAWN AEROBIC AND ANAEROBIC Blood Culture adequate volume Performed at [facility], [address], [place], [place] [address]    Culture PENDING    Report Status PENDING    DG Abd Portable 1V-Small Bowel Protocol-Position Verification  Result Date: 12/27/2022 CLINICAL DATA:  NG tube placement. EXAM: PORTABLE ABDOMEN - 1 VIEW COMPARISON:  12/27/2022. FINDINGS: The bowel gas pattern is normal. An enteric tube terminates in the stomach and contrast is present at the gastric fundus. Surgical clips are noted in the right upper quadrant. There is atherosclerotic calcification of the aorta. No radio-opaque calculi or other acute radiographic abnormality are seen. IMPRESSION: Enteric tube terminates in the stomach and contrast is present at the gastric fundus. Electronically Signed   By: Thornell Sartorius M.D.   On: 12/27/2022 22:52   CT ABDOMEN PELVIS WO CONTRAST  Result Date: 12/27/2022 CLINICAL DATA:  Acute onset left abdominal pain EXAM: CT ABDOMEN AND PELVIS WITHOUT CONTRAST TECHNIQUE: Multidetector CT imaging of the abdomen and pelvis was performed following the standard protocol without IV contrast. RADIATION DOSE REDUCTION: This exam was performed according to the departmental dose-optimization program which includes automated exposure control, adjustment of the mA and/or kV according to patient size and/or use of iterative reconstruction technique. COMPARISON:  CT  chest dated 08/08/2022, CT abdomen and pelvis dated 04/24/2018 FINDINGS: Lower chest: Bibasilar subsegmental atelectasis. Spiculated right lower lobe nodule is decreased in size, measuring 10 x 8 mm (5:10), previously 15 x 10 mm. Additional right lower lobe tree-in-bud nodules. Right lower lobe subsegmental mucous plugging. No pleural effusion or pneumothorax demonstrated. Partially imaged heart size is normal. Hepatobiliary: No focal hepatic lesions. No intra or extrahepatic biliary ductal dilation. Cholecystectomy. Pancreas: No focal lesions or main ductal dilation. Spleen: Normal in size without focal abnormality. Adrenals/Urinary Tract: Unchanged 2.4 cm left adrenal nodule measures 0 HU in keeping with adenoma. No specific follow-up imaging recommended. No right adrenal nodule. No suspicious renal mass, calculi or hydronephrosis. No focal bladder wall thickening. Bladder contains a single focus of intraluminal gas. Stomach/Bowel: Mural thickening of the gastric antrum. Ileocolic anastomosis. Diffuse small bowel dilation the small bowel immediately proximal to the ileocolonic anastomosis appears decompressed. A few loops in the right lower quadrant demonstrate fecalization of intraluminal contents. Colonic diverticulosis without acute diverticulitis. Vascular/Lymphatic: Aortic atherosclerosis. No enlarged abdominal or pelvic lymph nodes. Reproductive: No adnexal masses. Other: Small volume free fluid.  No fluid collection or free air. Musculoskeletal: No acute or abnormal lytic or blastic osseous lesions. Multilevel degenerative changes of the partially imaged thoracic and lumbar spine. Bilateral gluteal subcutaneous granulomas. IMPRESSION: 1. Diffuse small bowel dilation with fecalization of intraluminal contents in the right lower quadrant. While a transition point is not confidently identified on this noncontrast-enhanced examination, given history of prior bowel obstruction and surgery, findings are again  suspicious for small-bowel obstruction. 2. Mural thickening of the gastric antrum, which may be seen in the setting of gastritis. 3. Spiculated  right lower lobe nodule is decreased in size, measuring 10 x 8 mm, previously 15 x 10 mm. Additional right lower lobe tree-in-bud nodules and subsegmental mucous plugging, likely infectious/inflammatory. 4. Colonic diverticulosis without acute diverticulitis. 5. Single focus of intraluminal gas in the bladder. Recommend correlation with recent instrumentation. 6.  Aortic Atherosclerosis (ICD10-I70.0). Electronically Signed   By: Agustin Cree M.D.   On: 12/27/2022 18:54    Pending Labs Unresulted Labs (From admission, onward)     Start     Ordered   12/28/22 0500  CBC  Daily,   R      12/27/22 2113   12/28/22 0500  Comprehensive metabolic panel  Daily,   R      12/27/22 2113   12/27/22 2032  Legionella Pneumophila Serogp 1 Ur Ag  Once,   R        12/27/22 2031   12/27/22 2032  Strep pneumoniae urinary antigen  Once,   R        12/27/22 2031   12/27/22 2006  Respiratory (~20 pathogens) panel by PCR  (Respiratory panel by PCR (~20 pathogens, ~24 hr TAT)  w precautions)  Once,   R        12/27/22 2005   12/27/22 2006  Expectorated Sputum Assessment w Gram Stain, Rflx to Resp Cult  Once,   R       Question Answer Comment  Patient immune status Normal   Release to patient Immediate      12/27/22 2005   12/27/22 2005  Culture, blood (Routine X 2) w Reflex to ID Panel  BLOOD CULTURE X 2,   R (with TIMED occurrences)     Question Answer Comment  Patient immune status Normal   Release to patient Immediate      12/27/22 2004            Vitals/Pain Today's Vitals   12/27/22 2008 12/27/22 2208 12/27/22 2308 12/27/22 2331  BP:   (!) 144/86   Pulse:   79   Resp:  19 15   Temp: 97.6 F (36.4 C)     TempSrc: Oral     SpO2:   91%   PainSc:    2     Isolation Precautions Droplet precaution  Medications Medications  sodium chloride flush (NS)  0.9 % injection 3 mL (3 mLs Intravenous Given 12/27/22 2330)  sodium chloride flush (NS) 0.9 % injection 3 mL (has no administration in time range)  0.9 %  sodium chloride infusion (has no administration in time range)  lactated ringers infusion ( Intravenous New Bag/Given 12/27/22 2040)  acetaminophen (TYLENOL) tablet 650 mg (has no administration in time range)    Or  acetaminophen (TYLENOL) suppository 650 mg (has no administration in time range)  hydrALAZINE (APRESOLINE) injection 10 mg (has no administration in time range)  doxycycline (VIBRAMYCIN) 100 mg in sodium chloride 0.9 % 250 mL IVPB (has no administration in time range)  cefTRIAXone (ROCEPHIN) 1 g in sodium chloride 0.9 % 100 mL IVPB (has no administration in time range)  ipratropium-albuterol (DUONEB) 0.5-2.5 (3) MG/3ML nebulizer solution 3 mL (has no administration in time range)  HYDROmorphone (DILAUDID) injection 1 mg (1 mg Intravenous Given 12/27/22 2229)  guaiFENesin (MUCINEX) 12 hr tablet 600 mg (600 mg Oral Given 12/27/22 2329)  pantoprazole (PROTONIX) injection 40 mg (40 mg Intravenous Given 12/27/22 2328)  ondansetron (ZOFRAN) injection 4 mg (has no administration in time range)  fentaNYL (SUBLIMAZE) injection 50 mcg (50 mcg  Intravenous Given 12/27/22 1643)  ondansetron (ZOFRAN) injection 4 mg (4 mg Intravenous Given 12/27/22 1643)  sodium chloride 0.9 % bolus 1,000 mL (0 mLs Intravenous Stopped 12/27/22 1958)  HYDROmorphone (DILAUDID) injection 0.5 mg (0.5 mg Intravenous Given 12/27/22 1831)  cefTRIAXone (ROCEPHIN) 1 g in sodium chloride 0.9 % 100 mL IVPB (0 g Intravenous Stopped 12/27/22 2049)  doxycycline (VIBRAMYCIN) 100 mg in sodium chloride 0.9 % 250 mL IVPB (0 mg Intravenous Stopped 12/27/22 2258)  diatrizoate meglumine-sodium (GASTROGRAFIN) 66-10 % solution 90 mL (90 mLs Per NG tube Given 12/27/22 2130)  fentaNYL (SUBLIMAZE) injection 25 mcg (25 mcg Intravenous Given 12/27/22 2131)    Mobility non-ambulatory      Focused Assessments     R Recommendations: See Admitting Provider Note  Report given to:   Additional Notes:

## 2022-12-27 NOTE — H&P (Addendum)
History and Physical    Erica [name] AOZ:[id number] DOB: May 29, 1950 DOA: 12/27/2022  PCP: Gerre Scull, NP   Patient coming from: Home   Chief Complaint:  Chief Complaint  Patient presents with   Abdominal Pain    HPI:  Erica [name] is a 72 y.o. female with medical history significant of stress-induced cardiomyopathy, CAD calcification, grade 1 diastolic heart failure preserved EF 60 to 65%, small bowel obstruction, Crohn's disease (declined therapy in the past), colon cancer, COPD, chronic lung nodule, hyperlipidemia, chronically smoker, vitamin D deficiency, prediabetic and chronic pain syndrome, presented to emergency department with complaining of abdominal pain.  Patient reported abdominal pain started earlier today with associated some nausea.  Patient is unable to pass gas and she had a small bowel movement earlier today 12/27/2022.  Patient reported history of gastric surgery in the past includes right-sided hemicolectomy due for colon cancer.  Patient denies any fever, vomiting, melena, and hematemesis.   During my evaluation patient reported generalized abdominal pain 7 out of 10 intensity.  Patient is restless due to abdominal pain.  Patient also complaining about productive cough for last 4 to 5 days with associated shortness of breath..  She denies any fever, chill, orthopnea PND,, chest pain, palpitation, headache, anorexia and diarrhea.  Denies any known sick contact and known exposure to COVID.  Of note, patient follow with outpatient GI Dr. Leone Payor.  Per chart review of Dr. Stan Head note patient has history of colon cancer and Crohn's ileitis declines therapy in the past.  Colonoscopy from 06/2019 showed Crohn's disease with ileitis, diverticulosis in the sigmoid colon. Patient underwent colonoscopy on 10/05/2022 findings following:     ED Course:  At ED on initial presentation patient's vitals were stable except found to have elevated blood pressure 202/179  also patient became hypoxic O2 sat dropped 89% now maintaining 93% on 3 L. CBC showing leukocytosis 13.4, RBC 4.62, hemoglobin 14.5, hematocrit 45.9 and platelet count 245. CMP grossly unremarkable except low calcium 8.3 and low protein 6.3. Lipase 23 WNL. UA dipstick hemoglobin positive, trace leukocyte esterase and many bacteria.  CT abdomen pelvis: IMPRESSION: 1. Diffuse small bowel dilation with fecalization of intraluminal contents in the right lower quadrant. While a transition point is not confidently identified on this noncontrast-enhanced examination, given history of prior bowel obstruction and surgery, findings are again suspicious for small-bowel obstruction. 2. Mural thickening of the gastric antrum, which may be seen in the setting of gastritis. 3. Spiculated right lower lobe nodule is decreased in size, measuring 10 x 8 mm, previously 15 x 10 mm. Additional right lower lobe tree-in-bud nodules and subsegmental mucous plugging, likely infectious/inflammatory. 4. Colonic diverticulosis without acute diverticulitis. 5. Single focus of intraluminal gas in the bladder. Recommend correlation with recent instrumentation. 6.  Aortic Atherosclerosis (ICD10-I70.0).  ER physician Dr. Beckey Downing spoke with general surgery Dr. Chevis Pretty recommended small bowel protocol and place an NG tube.  General surgery will see patient in the morning.  Also based on CT finding there is concern for right lower lobe infectious process and with the concern for pneumonia patient has been started treating with ceftriaxone and doxycycline while in the ED.  Hospitalist team has been contacted for management of SBO and pneumonia.  Review of Systems:  Review of Systems  Constitutional:  Negative for chills, fever, malaise/fatigue and weight loss.  Respiratory:  Positive for sputum production. Negative for cough and shortness of breath.   Cardiovascular:  Negative for chest pain, orthopnea and  leg  swelling.  Gastrointestinal:  Positive for abdominal pain and nausea. Negative for blood in stool, constipation, diarrhea, heartburn, melena and vomiting.  Genitourinary:  Negative for dysuria, frequency and urgency.  Musculoskeletal:  Negative for myalgias.  Skin:  Negative for itching and rash.  Neurological:  Negative for dizziness and headaches.  Psychiatric/Behavioral:  The patient is not nervous/anxious.     Past Medical History:  Diagnosis Date   Acute duodenitis 04/24/2017   Allergy    ANEMIA    Anxiety    BACK PAIN, LUMBAR    Cancer of ascending colon [id number] s/p colectomy 03/05/2009 02/18/2010   Qualifier: Diagnosis of  By: Leone Payor MD, Alfonse Ras E    CHF (congestive heart failure) (HCC)    Complete rotator cuff tear of left shoulder 11/27/2013   Ultrasound guided injection on November 27, 2013    COPD (chronic obstructive pulmonary disease) with emphysema (HCC)    COPD exacerbation (HCC) 11/14/2017   Crohn's 02/2010   ileal ulcers, intol of entercort--refuses treatment   Emphysema of lung (HCC)    GERD    History of blood transfusion    w/ c/s surgery   History of transfusion of whole blood    HYPERLIPIDEMIA    HYPERTENSION    Microscopic hematuria    chronic   Myocardial infarction (HCC) 2010   Narcotic dependence (HCC) 05/25/2017   Obstruction of intestine or colon (HCC)    adhesions   OSTEOARTHRITIS    Rectal fissure    Possible fissure   Rotator cuff tear    Takotsubo syndrome 12/2008   URINARY INCONTINENCE     Past Surgical History:  Procedure Laterality Date   ABDOMINAL HYSTERECTOMY  1994   APPENDECTOMY  1964   BLADDER SUSPENSION     CESAREAN SECTION     x 3   CHOLECYSTECTOMY  1995   COLONOSCOPY  2017   COLONOSCOPY W/ BIOPSIES AND POLYPECTOMY  01/24/2011   (Crohn's)ileitis, internal hemorrhoids   ECTOPIC PREGNANCY SURGERY     ESOPHAGOGASTRODUODENOSCOPY     FACIAL COSMETIC SURGERY     HAND SURGERY Bilateral 1991   x 2   KNEE SURGERY Right  1981   LYSIS OF ADHESION  2010   ex lap/LOA for SBO Dr Johna Sheriff   ORBITAL FRACTURE SURGERY  1990   RIGHT COLECTOMY  03/2009   Right colectomy for colon CA.  Dr Dwain Sarna   TONSILLECTOMY  1952   TOTAL KNEE ARTHROPLASTY  03/2010   Dr Charlann Boxer.  Depuy   UPPER GASTROINTESTINAL ENDOSCOPY  05/16/2010   normal   UTERINE SUSPENSION     VIDEO BRONCHOSCOPY Bilateral 12/18/2014   Procedure: VIDEO BRONCHOSCOPY WITHOUT FLUORO;  Surgeon: Nyoka Cowden, MD;  Location: [facility];  Service: Cardiopulmonary;  Laterality: Bilateral;     reports that she has been smoking cigarettes. She has a 53 pack-year smoking history. She has never been exposed to tobacco smoke. She has never used smokeless tobacco. She reports current alcohol use. She reports that she does not use drugs.  Allergies  Allergen Reactions   Iodinated Contrast Media Itching    She had some itching after lumbar epidural injection in ipsilateral lower extremity 12/07/22.  Today she had itching in her ipsilateral upper extremity after a cervical epidural injection.   Morphine Nausea And Vomiting    Family History  Problem Relation Age of Onset   Throat cancer Mother    Colon cancer Father 28   Hypertension Father    Heart disease  Father    Kidney disease Father    Colon cancer Sister 80   Liver cancer Sister    Other Sister        amyloidosis   Arthritis Other        Parent, other relative   Stomach cancer Neg Hx    Rectal cancer Neg Hx    Breast cancer Neg Hx     Prior to Admission medications   Medication Sig Start Date End Date Taking? Authorizing Provider  albuterol (PROVENTIL) (2.5 MG/3ML) 0.083% nebulizer solution Take 3 mLs (2.5 mg total) by nebulization every 6 (six) hours as needed for wheezing or shortness of breath. 03/23/22   McElwee, [name], NP  albuterol (VENTOLIN HFA) 108 (90 Base) MCG/ACT inhaler INHALE 2 PUFFS BY MOUTH EVERY 6 HOURS AS NEEDED FOR WHEEZING FOR SHORTNESS OF BREATH 10/17/22   McElwee, [name], NP   aspirin EC 81 MG tablet Take 81 mg by mouth daily.    [provider]  cyclobenzaprine (FLEXERIL) 10 MG tablet Take 1 tablet by mouth three times daily as needed for muscle spasm 11/21/22   McElwee, [name], NP  fluticasone (FLONASE) 50 MCG/ACT nasal spray Place 2 sprays into both nostrils daily. 12/25/22   McElwee, Jake Church, NP  HYDROcodone-acetaminophen (NORCO) 5-325 MG tablet Take 1 tablet by mouth 2 (two) times daily as needed for severe pain. 12/01/22 12/31/22  Angelina Sheriff, DO  HYDROcodone-acetaminophen (NORCO) 5-325 MG tablet Take 1 tablet by mouth 2 (two) times daily as needed for severe pain. 12/30/22 01/29/23  Angelina Sheriff, DO  HYDROcodone-acetaminophen (NORCO) 5-325 MG tablet Take 1 tablet by mouth 2 (two) times daily as needed for severe pain. 01/29/23 02/28/23  Angelina Sheriff, DO  hydrOXYzine (ATARAX) 10 MG tablet Take 1 tablet (10 mg total) by mouth 3 (three) times daily as needed for anxiety. 08/21/22   McElwee, [name], NP  metoprolol tartrate (LOPRESSOR) 25 MG tablet TAKE 1 TABLET BY MOUTH TWICE DAILY --PLEASE  CALL  OUR  OFFICE  TO  SCHEDULE  AN  APPT  FOR  FUTURE  REFILLS  --2ND  ATTEMPT 11/22/22   Tereso Newcomer T, PA-C  rosuvastatin (CRESTOR) 10 MG tablet Take 1 tablet (10 mg total) by mouth 2 (two) times a week. Patient takes 10mg  on Mondays and 10mg  on Fridays. 12/25/22   McElwee, Jake Church, NP  Vitamin D, Ergocalciferol, (DRISDOL) 1.25 MG (50000 UNIT) CAPS capsule Take 1 capsule by mouth once a week 11/21/22   Gerre Scull, NP     Physical Exam: Vitals:   12/27/22 1836 12/27/22 1840 12/27/22 1900 12/27/22 2008  BP: (!) 141/72  (!) 156/88   Pulse: 76  73   Resp:   20   Temp:    97.6 F (36.4 C)  TempSrc:    Oral  SpO2: (!) 89% 93% 99%     Physical Exam Constitutional:      General: She is not in acute distress.    Appearance: She is ill-appearing.  Cardiovascular:     Rate and Rhythm: Normal rate and regular rhythm.     Heart sounds: Normal heart  sounds. No murmur heard. Pulmonary:     Effort: Pulmonary effort is normal. No respiratory distress.     Breath sounds: Normal breath sounds. No wheezing, rhonchi or rales.  Abdominal:     General: Bowel sounds are decreased. There is no distension.     Palpations: Abdomen is rigid. There is no mass.  Tenderness: There is generalized abdominal tenderness. There is no guarding or rebound.     Hernia: No hernia is present.  Skin:    General: Skin is dry.     Capillary Refill: Capillary refill takes less than 2 seconds.  Neurological:     Mental Status: She is alert and oriented to person, place, and time.  Psychiatric:        Mood and Affect: Mood is anxious. Mood is not depressed.      Labs on Admission: I have personally reviewed following labs and imaging studies  CBC: Recent Labs  Lab 12/25/22 1049 12/27/22 1627  WBC 7.7 13.4*  NEUTROABS 5.2 10.1*  HGB 13.2 14.5  HCT 40.8 45.9  MCV 95.3 99.4  PLT 241.0 245   Basic Metabolic Panel: Recent Labs  Lab 12/25/22 1049 12/27/22 1627  NA 143 139  K 4.9 4.1  CL 106 107  CO2 28 24  GLUCOSE 95 97  BUN 18 16  CREATININE 0.65 0.56  CALCIUM 9.9 8.3*   GFR: Estimated Creatinine Clearance: 48 mL/min (by C-G formula based on SCr of 0.56 mg/dL). Liver Function Tests: Recent Labs  Lab 12/25/22 1049 12/27/22 1627  AST 14 22  ALT 14 19  ALKPHOS 55 49  BILITOT 0.4 0.8  PROT 6.5 6.3*  ALBUMIN 4.3 3.5   Recent Labs  Lab 12/27/22 1627  LIPASE 23   No results for input(s): "AMMONIA" in the last 168 hours. Coagulation Profile: No results for input(s): "INR", "PROTIME" in the last 168 hours. Cardiac Enzymes: No results for input(s): "CKTOTAL", "CKMB", "CKMBINDEX", "TROPONINI", "TROPONINIHS" in the last 168 hours. BNP (last 3 results) No results for input(s): "BNP" in the last 8760 hours. HbA1C: Recent Labs    12/25/22 1049  HGBA1C 5.9   CBG: No results for input(s): "GLUCAP" in the last 168 hours. Lipid  Profile: Recent Labs    12/25/22 1049  CHOL 177  HDL 65.80  LDLCALC 84  TRIG 136.0  CHOLHDL 3   Thyroid Function Tests: Recent Labs    12/25/22 1049  TSH 0.35   Anemia Panel: No results for input(s): "VITAMINB12", "FOLATE", "FERRITIN", "TIBC", "IRON", "RETICCTPCT" in the last 72 hours. Urine analysis:    Component Value Date/Time   COLORURINE YELLOW 12/27/2022 1817   APPEARANCEUR CLEAR 12/27/2022 1817   LABSPEC 1.021 12/27/2022 1817   PHURINE 5.0 12/27/2022 1817   GLUCOSEU NEGATIVE 12/27/2022 1817   GLUCOSEU NEGATIVE 02/18/2020 0909   HGBUR MODERATE (A) 12/27/2022 1817   BILIRUBINUR NEGATIVE 12/27/2022 1817   BILIRUBINUR negative 04/17/2014 1016   KETONESUR NEGATIVE 12/27/2022 1817   PROTEINUR NEGATIVE 12/27/2022 1817   UROBILINOGEN 0.2 02/18/2020 0909   NITRITE NEGATIVE 12/27/2022 1817   LEUKOCYTESUR TRACE (A) 12/27/2022 1817    Radiological Exams on Admission: I have personally reviewed images CT ABDOMEN PELVIS WO CONTRAST  Result Date: 12/27/2022 CLINICAL DATA:  Acute onset left abdominal pain EXAM: CT ABDOMEN AND PELVIS WITHOUT CONTRAST TECHNIQUE: Multidetector CT imaging of the abdomen and pelvis was performed following the standard protocol without IV contrast. RADIATION DOSE REDUCTION: This exam was performed according to the departmental dose-optimization program which includes automated exposure control, adjustment of the mA and/or kV according to patient size and/or use of iterative reconstruction technique. COMPARISON:  CT chest dated 08/08/2022, CT abdomen and pelvis dated 04/24/2018 FINDINGS: Lower chest: Bibasilar subsegmental atelectasis. Spiculated right lower lobe nodule is decreased in size, measuring 10 x 8 mm (5:10), previously 15 x 10 mm. Additional right lower  lobe tree-in-bud nodules. Right lower lobe subsegmental mucous plugging. No pleural effusion or pneumothorax demonstrated. Partially imaged heart size is normal. Hepatobiliary: No focal hepatic  lesions. No intra or extrahepatic biliary ductal dilation. Cholecystectomy. Pancreas: No focal lesions or main ductal dilation. Spleen: Normal in size without focal abnormality. Adrenals/Urinary Tract: Unchanged 2.4 cm left adrenal nodule measures 0 HU in keeping with adenoma. No specific follow-up imaging recommended. No right adrenal nodule. No suspicious renal mass, calculi or hydronephrosis. No focal bladder wall thickening. Bladder contains a single focus of intraluminal gas. Stomach/Bowel: Mural thickening of the gastric antrum. Ileocolic anastomosis. Diffuse small bowel dilation the small bowel immediately proximal to the ileocolonic anastomosis appears decompressed. A few loops in the right lower quadrant demonstrate fecalization of intraluminal contents. Colonic diverticulosis without acute diverticulitis. Vascular/Lymphatic: Aortic atherosclerosis. No enlarged abdominal or pelvic lymph nodes. Reproductive: No adnexal masses. Other: Small volume free fluid.  No fluid collection or free air. Musculoskeletal: No acute or abnormal lytic or blastic osseous lesions. Multilevel degenerative changes of the partially imaged thoracic and lumbar spine. Bilateral gluteal subcutaneous granulomas. IMPRESSION: 1. Diffuse small bowel dilation with fecalization of intraluminal contents in the right lower quadrant. While a transition point is not confidently identified on this noncontrast-enhanced examination, given history of prior bowel obstruction and surgery, findings are again suspicious for small-bowel obstruction. 2. Mural thickening of the gastric antrum, which may be seen in the setting of gastritis. 3. Spiculated right lower lobe nodule is decreased in size, measuring 10 x 8 mm, previously 15 x 10 mm. Additional right lower lobe tree-in-bud nodules and subsegmental mucous plugging, likely infectious/inflammatory. 4. Colonic diverticulosis without acute diverticulitis. 5. Single focus of intraluminal gas in the  bladder. Recommend correlation with recent instrumentation. 6.  Aortic Atherosclerosis (ICD10-I70.0). Electronically Signed   By: Agustin Cree M.D.   On: 12/27/2022 18:54    EKG: none   Assessment/Plan: Principal Problem:   SBO (small bowel obstruction) (HCC) Active Problems:   Right lower lobe pneumonia   Hyperlipidemia   History of colon cancer   History of Crohn's disease   Vitamin D deficiency   COPD (chronic obstructive pulmonary disease) (HCC)   Lung nodule   Chronic pain syndrome   Chronic diastolic CHF (congestive heart failure) (HCC)   Acute hypoxic respiratory failure (HCC)   History of CAD (coronary artery disease)   GAD (generalized anxiety disorder)   Acute gastritis without bleeding    Assessment and Plan: Small bowel obstruction History of previous SBO History of bowel surgery due to colon cancer History of Crohn's disease -Patient presenting with complaining of generalized abdominal pain and nausea since last 18 to 19-hours.  Patient has last bowel movement earlier this morning.  She is unable to pass flatus.  Physical exam showed diffuse abdominal pain 7-8 out of intensity and generalized abdominal rigidity due to pain.  Decreased bowel movement.  Patient denies any vomiting, melena, hematochezia and noticing any frank blood in her stool. - Patient reported history of small bowel obstruction.  She has history of right-sided hemicolectomy due to colon cancer.  Unable to verify which year it was taken place. - Currently patient is afebrile. leukocytosis 13.5. -CT abdomen pelvis showed  Diffuse small bowel dilation with fecalization of intraluminal contents in the right lower quadrant. While a transition point is not confidently identified on this noncontrast-enhanced examination, given history of prior bowel obstruction and surgery, findings are again suspicious for small-bowel obstruction. -ER physician consulted general surgery Dr. Chevis Pretty recommended  NG tube  placement and small bowel protocol. - Planning to place NG tube and ordered SBO protocol. - Patient is allergic to morphine.  Continue Dilaudid 1 mg every 2 hour as needed for moderate, severe and breakthrough pain. -Continue Zofran as needed. -Keep patient n.p.o. and holding any oral medication. - Continue LR 100 cc/h for 1 day. -General Surgery continue to follow and see patient in the morning. -Consulted [place] gastro[facility]. -Will follow-up with general surgery and gastroenterology for further recommendation.  Acute hypoxic respiratory failure due to pneumonia Right lobe pneumonia/community-acquired pneumonia -Patient presenting with productive cough for last 4 to 5 days with associated shortness of breath.  She does not have any fever, has mild leukocytosis however patient become hypoxic O2 sat dropped to 89% on room air currently maintaining 93% on 3 L.  Given patient has history of COPD and sudden drop of O2 sat on room air in the setting of leukocytosis and CT abdomen showed evidence of right lower lobe nodule/subsegmental mucous plugging likely infectious process.  Concern for right lower lobe pneumonia. - Continue IV ceftriaxone 1 g daily and IV doxycycline 100 mg twice daily. - Will follow-up with respiratory panel, urine Legionella antigen, urine strep antigen, blood culture and sputum culture for appropriate antibiotic guidance. -If cultures remains negative and patient remains afebrile in that case we will consider to DC the antibiotics. -Continue Mucinex, spirometry and flutter valve. - Continue supplemental oxygen to keep O2 sat above 92%.  Wean down oxygen to room air at patient tolerates to keep O2 sat above 92%.  Gastritis - CT abdomen pelvis showed mural thickening of the gastric antrum in the setting of gastritis.  Patient is planing about heartburn and nausea.  Patient denies any use of ibuprofen, Motrin, Goody and BC powder. - Continue Protonix 40 mg IV  daily. -Checking H. pylori stool antigen screening test.   COPD without acute exacerbation -Patient has history of COPD.  At home she is on albuterol as needed - Continue DuoNeb every 4 hours as needed for wheezing shortness of breath. - Continue spirometry and flutter valve. - Continue supplemental oxygen.  Chronic lung nodule -Patient has history of chronic lung nodule.  CT abdomen showed right lower lobe nodule decreased in size measuring 10 x 8 mm previously from 15/10 mm.  Patient needs outpatient follow-up with pulmonology on DC.   Chronic pain syndrome - Holding Flexeril and Norco as patient is n.p.o.  Grade 1 diastolic heart failure with preserved EF 60 to 65% History of stress-induced cardiomyopathy History of CAD  -Patient has history of stress-induced cardiomyopathy.  Patient has been last evaluated by cardiology Dr. Verdis Prime on 11/2021 for management of diastolic heart failure and stress-induced cardiomyopathy 2010.  Currently on aspirin 81 mg daily and Lopressor 25 mg twice daily. - On presentation to ER patient blood pressure found elevated 202/179 which has been improved to 156/88 without any intervention.  Elevated blood pressure likely in the setting of abdominal pain in in the context of SBO. -Holding aspirin and metoprolol as patient is NPO. - Continue hydralazine as needed.  Hyperlipidemia - Holding Crestor as patient is n.p.o.  Generalized anxiety disorder - Holding home Atarax as patient is NPO.  DVT prophylaxis:  SCDs.  Deferring pharmacological prophylaxis in case patient need any surgery in the morning. Code Status:  Full Code.  I have verified with patient at the bedside. Diet: Currently n.p.o. Family Communication: No family member at bedside now Disposition Plan: Pending clinical improvement.  Pending improvement of SBO.  Ruling out pneumonia to decide further antibiotic guidance. Consults: General Surgery Admission status:   Inpatient, Telemetry  bed  Severity of Illness: The appropriate patient status for this patient is INPATIENT. Inpatient status is judged to be reasonable and necessary in order to provide the required intensity of service to ensure the patient's safety. The patient's presenting symptoms, physical exam findings, and initial radiographic and laboratory data in the context of their chronic comorbidities is felt to place them at high risk for further clinical deterioration. Furthermore, it is not anticipated that the patient will be medically stable for discharge from the hospital within 2 midnights of admission.   * I certify that at the point of admission it is my clinical judgment that the patient will require inpatient hospital care spanning beyond 2 midnights from the point of admission due to high intensity of service, high risk for further deterioration and high frequency of surveillance required.[name]    Tereasa Coop, MD [facility]  How to contact the [facility] Attending or Consulting provider 7A - 7P or covering provider during after hours 7P -7A, for this patient.  Check the care team in [facility] and look for a) attending/consulting [facility] provider listed and b) the [facility] team listed Log into [email or web address] and use 's universal password to access. If you do not have the password, please contact the hospital operator. Locate the [facility] provider you are looking for under [facility] and page to a number that you can be directly reached. If you still have difficulty reaching the provider, please page the [facility] (Director on Call) for the Hospitalists listed on amion for assistance.  12/27/2022, 8:58 PM

## 2022-12-28 ENCOUNTER — Inpatient Hospital Stay (HOSPITAL_COMMUNITY): Payer: Medicare Other

## 2022-12-28 DIAGNOSIS — K56609 Unspecified intestinal obstruction, unspecified as to partial versus complete obstruction: Secondary | ICD-10-CM | POA: Diagnosis not present

## 2022-12-28 DIAGNOSIS — F411 Generalized anxiety disorder: Secondary | ICD-10-CM

## 2022-12-28 DIAGNOSIS — J9601 Acute respiratory failure with hypoxia: Secondary | ICD-10-CM | POA: Diagnosis not present

## 2022-12-28 DIAGNOSIS — K50018 Crohn's disease of small intestine with other complication: Secondary | ICD-10-CM | POA: Diagnosis not present

## 2022-12-28 DIAGNOSIS — J439 Emphysema, unspecified: Secondary | ICD-10-CM

## 2022-12-28 DIAGNOSIS — J189 Pneumonia, unspecified organism: Secondary | ICD-10-CM | POA: Diagnosis not present

## 2022-12-28 DIAGNOSIS — Z8719 Personal history of other diseases of the digestive system: Secondary | ICD-10-CM | POA: Diagnosis not present

## 2022-12-28 LAB — COMPREHENSIVE METABOLIC PANEL
ALT: 24 U/L (ref 0–44)
AST: 23 U/L (ref 15–41)
Albumin: 3.6 g/dL (ref 3.5–5.0)
Alkaline Phosphatase: 63 U/L (ref 38–126)
Anion gap: 10 (ref 5–15)
BUN: 18 mg/dL (ref 8–23)
CO2: 21 mmol/L — ABNORMAL LOW (ref 22–32)
Calcium: 8.9 mg/dL (ref 8.9–10.3)
Chloride: 106 mmol/L (ref 98–111)
Creatinine, Ser: 0.51 mg/dL (ref 0.44–1.00)
GFR, Estimated: >60 mL/min (ref >60)
Glucose, Bld: 133 mg/dL — ABNORMAL HIGH (ref 70–99)
Potassium: 4.4 mmol/L (ref 3.5–5.1)
Sodium: 137 mmol/L (ref 135–145)
Total Bilirubin: 0.7 mg/dL (ref 0.3–1.2)
Total Protein: 6.6 g/dL (ref 6.5–8.1)

## 2022-12-28 LAB — CBC
HCT: 50.7 % — ABNORMAL HIGH (ref 36.0–46.0)
Hemoglobin: 15.8 g/dL — ABNORMAL HIGH (ref 12.0–15.0)
MCH: 30.6 pg (ref 26.0–34.0)
MCHC: 31.2 g/dL (ref 30.0–36.0)
MCV: 98.1 fL (ref 80.0–100.0)
Platelets: 235 10*3/uL (ref 150–400)
RBC: 5.17 MIL/uL — ABNORMAL HIGH (ref 3.87–5.11)
RDW: 12.9 % (ref 11.5–15.5)
WBC: 8.2 10*3/uL (ref 4.0–10.5)
nRBC: 0 % (ref 0.0–0.2)

## 2022-12-28 LAB — RESPIRATORY PANEL BY PCR

## 2022-12-28 LAB — CULTURE, BLOOD (ROUTINE X 2): Special Requests: ADEQUATE

## 2022-12-28 MED ORDER — ACETAMINOPHEN 160 MG/5ML PO SOLN: 650.0000 mg | Freq: Four times a day (QID) | ORAL | Status: DC | PRN

## 2022-12-28 MED ORDER — ACETAMINOPHEN 650 MG RE SUPP: 650.0000 mg | Freq: Four times a day (QID) | RECTAL | Status: DC | PRN

## 2022-12-28 MED ORDER — PHENOL 1.4 % MT LIQD
1.0000 | OROMUCOSAL | Status: DC | PRN
  Administered 2022-12-28: 1 via OROMUCOSAL
  Filled 2022-12-28: qty 177

## 2022-12-28 MED ORDER — DIATRIZOATE MEGLUMINE & SODIUM 66-10 % PO SOLN
90.0000 mL | Freq: Once | ORAL | Status: AC
  Administered 2022-12-28: 90 mL via NASOGASTRIC
  Filled 2022-12-28: qty 90

## 2022-12-28 MED ORDER — MENTHOL 3 MG MT LOZG: 1.0000 | LOZENGE | OROMUCOSAL | Status: DC | PRN

## 2022-12-28 MED ORDER — GUAIFENESIN 100 MG/5ML PO LIQD
15.0000 mL | Freq: Four times a day (QID) | ORAL | Status: DC
  Administered 2022-12-29 (×2): 15 mL
  Filled 2022-12-28 (×2): qty 20

## 2022-12-28 NOTE — Assessment & Plan Note (Signed)
Prior EGD done few years ago was normal.  No NSAID use. -GI is recommending continuing Protonix 40 mg IV daily. -Pending H. pylori stool antigen screening test

## 2022-12-28 NOTE — Assessment & Plan Note (Signed)
S/p hemicolectomy. GI is on board

## 2022-12-28 NOTE — Assessment & Plan Note (Signed)
History of stress-induced cardiomyopathy History of CAD  Currently appears euvolemic. -Continue home aspirin and Lopressor-once able to take p.o.

## 2022-12-28 NOTE — Assessment & Plan Note (Signed)
Imaging concerning for right lower lobe pneumonia, patient with some leukocytosis but that can be due to SBO.  Procalcitonin and respiratory viral panel negative.  Per patient she do get 4-5 times pneumonia each year.  Currently on 2 L of oxygen with no baseline oxygen use. -Continue ceftriaxone and doxycycline for now -Follow-up strep pneumo and Legionella antigen

## 2022-12-28 NOTE — Assessment & Plan Note (Signed)
Holding home Atarax as she is n.p.o.

## 2022-12-28 NOTE — Assessment & Plan Note (Signed)
History of previous SBO History of bowel surgery due to colon cancer History of Crohn's disease. S/p NG tube placement.  No bowel movement or passing flatus at this time. General surgery is on board and they will try conservative management first-ordered SBO protocol GI series. -Continue with supportive care and NG tube -Continue with pain management

## 2022-12-28 NOTE — Assessment & Plan Note (Signed)
No concern of exacerbation. -Continue with bronchodilator

## 2022-12-28 NOTE — Assessment & Plan Note (Signed)
Currently on 2 L of oxygen, no baseline oxygen use. -Continue with supplemental oxygen-wean as tolerated

## 2022-12-28 NOTE — Assessment & Plan Note (Signed)
Takes Flexeril and Norco at home. -Currently holding as she is n.p.o.

## 2022-12-28 NOTE — Consult Note (Addendum)
Referring Provider: EDP Primary Care Physician:  Gerre Scull, NP Primary Gastroenterologist:  Dr. Leone Payor  Reason for Consultation: Crohn's and small bowel obstruction  HPI: Erica [name] is a 72 y.o. female with medical history significant of stress-induced cardiomyopathy, CAD calcification, grade 1 diastolic heart failure preserved EF 60 to 65%, small bowel obstructions requiring surgery in the past, Crohn's disease (declined therapy in the past), colon cancer s/p right hemi-colectomy, COPD, chronic lung nodule, hyperlipidemia, chronically smoker, vitamin D deficiency, prediabetic, and chronic pain syndrome on narcotics, presented to emergency department with complaining of abdominal pain.  Says that she always has chronic abdominal pain, but yesterday morning she noticed worsening from her baseline abdominal pain.  Her abdomen became hard like a rock.  No vomiting.  She was not passing flatus, but did have a very small bowel movement yesterday morning.  Still not passing flatus or no bowel movement today.  She does have an NG tube in place with a small amount of green liquid in her canister.  Surgery has just seen her consult as well.  Labs fairly unremarkable.  She is being evaluated and treated for pneumonia as well.  CT scan of the abdomen and pelvis without contrast 12/27/2022 IMPRESSION: 1. Diffuse small bowel dilation with fecalization of intraluminal contents in the right lower quadrant. While a transition point is not confidently identified on this noncontrast-enhanced examination, given history of prior bowel obstruction and surgery, findings are again suspicious for small-bowel obstruction. 2. Mural thickening of the gastric antrum, which may be seen in the setting of gastritis. 3. Spiculated right lower lobe nodule is decreased in size, measuring 10 x 8 mm, previously 15 x 10 mm. Additional right lower lobe tree-in-bud nodules and subsegmental mucous plugging,  likely infectious/inflammatory. 4. Colonic diverticulosis without acute diverticulitis. 5. Single focus of intraluminal gas in the bladder. Recommend correlation with recent instrumentation. 6.  Aortic Atherosclerosis (ICD10-I70.0).  Colonoscopy 10/31/2022 Impression: - Hemorrhoids found on perianal exam. - Two diminutive polyps in the rectum, removed with a cold snare. Resected and retrieved. - Diverticulosis in the sigmoid colon. - Patent end- to- side ileo- colonic anastomosis. - Crohn' s disease with ileitis. Inflammation was found. This was mild in severity. - The examination was otherwise normal on direct and retroflexion views.  Colonoscopy 06/27/19 Impression:               - Patent end-to-side ileo-colonic anastomosis.                           - Crohn's disease with ileitis. Inflammation was                            found.                           - Diverticulosis in the sigmoid colon.                           - The examination was otherwise normal on direct                            and retroflexion views.                           - No  specimens collected  EGD 03/2017 - Mucosal changes in the duodenum. Biopsied. A soft, smooth enlarged fold that was easily compressed with forceps. - The examination was otherwise normal. No chnages that correlate with thickened stomach wall seen on CT in setting of SBO.  Past Medical History:  Diagnosis Date   Acute duodenitis 04/24/2017   Allergy    ANEMIA    Anxiety    BACK PAIN, LUMBAR    Cancer of ascending colon [id number] s/p colectomy 03/05/2009 02/18/2010   Qualifier: Diagnosis of  By: Leone Payor MD, Alfonse Ras E    CHF (congestive heart failure) (HCC)    Complete rotator cuff tear of left shoulder 11/27/2013   Ultrasound guided injection on November 27, 2013    COPD (chronic obstructive pulmonary disease) with emphysema (HCC)    COPD exacerbation (HCC) 11/14/2017   Crohn's 02/2010   ileal ulcers, intol of entercort--refuses  treatment   Emphysema of lung (HCC)    GERD    History of blood transfusion    w/ c/s surgery   History of transfusion of whole blood    HYPERLIPIDEMIA    HYPERTENSION    Microscopic hematuria    chronic   Myocardial infarction (HCC) 2010   Narcotic dependence (HCC) 05/25/2017   Obstruction of intestine or colon (HCC)    adhesions   OSTEOARTHRITIS    Rectal fissure    Possible fissure   Rotator cuff tear    Takotsubo syndrome 12/2008   URINARY INCONTINENCE     Past Surgical History:  Procedure Laterality Date   ABDOMINAL HYSTERECTOMY  1994   APPENDECTOMY  1964   BLADDER SUSPENSION     CESAREAN SECTION     x 3   CHOLECYSTECTOMY  1995   COLONOSCOPY  2017   COLONOSCOPY W/ BIOPSIES AND POLYPECTOMY  01/24/2011   (Crohn's)ileitis, internal hemorrhoids   ECTOPIC PREGNANCY SURGERY     ESOPHAGOGASTRODUODENOSCOPY     FACIAL COSMETIC SURGERY     HAND SURGERY Bilateral 1991   x 2   KNEE SURGERY Right 1981   LYSIS OF ADHESION  2010   ex lap/LOA for SBO Dr Johna Sheriff   ORBITAL FRACTURE SURGERY  1990   RIGHT COLECTOMY  03/2009   Right colectomy for colon CA.  Dr Dwain Sarna   TONSILLECTOMY  1952   TOTAL KNEE ARTHROPLASTY  03/2010   Dr Charlann Boxer.  Depuy   UPPER GASTROINTESTINAL ENDOSCOPY  05/16/2010   normal   UTERINE SUSPENSION     VIDEO BRONCHOSCOPY Bilateral 12/18/2014   Procedure: VIDEO BRONCHOSCOPY WITHOUT FLUORO;  Surgeon: Nyoka Cowden, MD;  Location: [facility];  Service: Cardiopulmonary;  Laterality: Bilateral;    Prior to Admission medications   Medication Sig Start Date End Date Taking? Authorizing Provider  albuterol (PROVENTIL) (2.5 MG/3ML) 0.083% nebulizer solution Take 3 mLs (2.5 mg total) by nebulization every 6 (six) hours as needed for wheezing or shortness of breath. 03/23/22   McElwee, [name], NP  albuterol (VENTOLIN HFA) 108 (90 Base) MCG/ACT inhaler INHALE 2 PUFFS BY MOUTH EVERY 6 HOURS AS NEEDED FOR WHEEZING FOR SHORTNESS OF BREATH 10/17/22   McElwee, Lauren  A, NP  aspirin EC 81 MG tablet Take 81 mg by mouth daily.    [provider]  cyclobenzaprine (FLEXERIL) 10 MG tablet Take 1 tablet by mouth three times daily as needed for muscle spasm 11/21/22   McElwee, [name], NP  fluticasone (FLONASE) 50 MCG/ACT nasal spray Place 2 sprays into both nostrils daily. 12/25/22  McElwee, [name], NP  HYDROcodone-acetaminophen (NORCO) 5-325 MG tablet Take 1 tablet by mouth 2 (two) times daily as needed for severe pain. 12/01/22 12/31/22  Angelina Sheriff, DO  HYDROcodone-acetaminophen (NORCO) 5-325 MG tablet Take 1 tablet by mouth 2 (two) times daily as needed for severe pain. 12/30/22 01/29/23  Angelina Sheriff, DO  HYDROcodone-acetaminophen (NORCO) 5-325 MG tablet Take 1 tablet by mouth 2 (two) times daily as needed for severe pain. 01/29/23 02/28/23  Angelina Sheriff, DO  hydrOXYzine (ATARAX) 10 MG tablet Take 1 tablet (10 mg total) by mouth 3 (three) times daily as needed for anxiety. 08/21/22   McElwee, [name], NP  metoprolol tartrate (LOPRESSOR) 25 MG tablet TAKE 1 TABLET BY MOUTH TWICE DAILY --PLEASE  CALL  OUR  OFFICE  TO  SCHEDULE  AN  APPT  FOR  FUTURE  REFILLS  --2ND  ATTEMPT 11/22/22   Tereso Newcomer T, PA-C  rosuvastatin (CRESTOR) 10 MG tablet Take 1 tablet (10 mg total) by mouth 2 (two) times a week. Patient takes 10mg  on Mondays and 10mg  on Fridays. 12/25/22   McElwee, Jake Church, NP  Vitamin D, Ergocalciferol, (DRISDOL) 1.25 MG (50000 UNIT) CAPS capsule Take 1 capsule by mouth once a week 11/21/22   Gerre Scull, NP    Current Facility-Administered Medications  Medication Dose Route Frequency Provider Last Rate Last Admin   0.9 %  sodium chloride infusion  250 mL Intravenous PRN Janalyn Shy, Subrina, MD       acetaminophen (TYLENOL) tablet 650 mg  650 mg Oral Q6H PRN Janalyn Shy, Subrina, MD       Or   acetaminophen (TYLENOL) suppository 650 mg  650 mg Rectal Q6H PRN Janalyn Shy, Subrina, MD       cefTRIAXone (ROCEPHIN) 1 g in sodium chloride 0.9 % 100 mL  IVPB  1 g Intravenous Q24H Sundil, Subrina, MD       doxycycline (VIBRAMYCIN) 100 mg in sodium chloride 0.9 % 250 mL IVPB  100 mg Intravenous Q12H Sundil, Subrina, MD 125 mL/hr at 12/28/22 0847 100 mg at 12/28/22 0847   guaiFENesin (MUCINEX) 12 hr tablet 600 mg  600 mg Oral BID Sundil, Subrina, MD   600 mg at 12/27/22 2329   hydrALAZINE (APRESOLINE) injection 10 mg  10 mg Intravenous Q6H PRN Sundil, Subrina, MD       HYDROmorphone (DILAUDID) injection 1 mg  1 mg Intravenous Q2H PRN Sundil, Subrina, MD   1 mg at 12/28/22 0751   ipratropium-albuterol (DUONEB) 0.5-2.5 (3) MG/3ML nebulizer solution 3 mL  3 mL Nebulization Q4H PRN Sundil, Subrina, MD       lactated ringers infusion   Intravenous Continuous Sundil, Subrina, MD 100 mL/hr at 12/28/22 0451 New Bag at 12/28/22 0451   ondansetron (ZOFRAN) injection 4 mg  4 mg Intravenous Q6H PRN Sundil, Subrina, MD       pantoprazole (PROTONIX) injection 40 mg  40 mg Intravenous Q24H Sundil, Subrina, MD   40 mg at 12/27/22 2328   sodium chloride flush (NS) 0.9 % injection 3 mL  3 mL Intravenous Q12H Sundil, Subrina, MD   3 mL at 12/27/22 2330   sodium chloride flush (NS) 0.9 % injection 3 mL  3 mL Intravenous PRN Tereasa Coop, MD        Allergies as of 12/27/2022 - Review Complete 12/27/2022  Allergen Reaction Noted   Iodinated contrast media Itching 12/21/2022   Morphine Nausea And Vomiting     Family History  Problem Relation  Age of Onset   Throat cancer Mother    Colon cancer Father 52   Hypertension Father    Heart disease Father    Kidney disease Father    Colon cancer Sister 35   Liver cancer Sister    Other Sister        amyloidosis   Arthritis Other        Parent, other relative   Stomach cancer Neg Hx    Rectal cancer Neg Hx    Breast cancer Neg Hx     Social History   Socioeconomic History   Marital status: Widowed    Spouse name: Not on file   Number of children: 5   Years of education: 14   Highest education level:  High school graduate  Occupational History   Occupation: Retired     Associate Professor: UNEMPLOYED  Tobacco Use   Smoking status: Every Day    Current packs/day: 1.00    Average packs/day: 1 pack/day for 53.0 years (53.0 ttl pk-yrs)    Types: Cigarettes    Passive exposure: Never   Smokeless tobacco: Never   Tobacco comments:    Smokes 0.5 packs of cigarettes daily [facility] 08/22/22  Vaping Use   Vaping status: Never Used  Substance and Sexual Activity   Alcohol use: Yes    Comment: occasional mixed drink   Drug use: No   Sexual activity: Not Currently    Birth control/protection: Surgical    Comment: Hysterectomy  Other Topics Concern   Not on file  Social History Narrative   Patient is a widow.   Lives in an Apt. 1 son lives in [place] the other 4 adult children live in [place].   Continues to smoke daily, occasional alcohol no drug use   Social Determinants of Health   Financial Resource Strain: Low Risk  (11/14/2021)   Overall Financial Resource Strain (CARDIA)    Difficulty of Paying Living Expenses: Not hard at all  Food Insecurity: No Food Insecurity (12/28/2022)   Hunger Vital Sign    Worried About Running Out of Food in the Last Year: Never true    Ran Out of Food in the Last Year: Never true  Transportation Needs: No Transportation Needs (12/28/2022)   PRAPARE - Administrator, Civil Service (Medical): No    Lack of Transportation (Non-Medical): No  Physical Activity: Sufficiently Active (11/14/2021)   Exercise Vital Sign    Days of Exercise per Week: 5 days    Minutes of Exercise per Session: 30 min  Stress: No Stress Concern Present (11/14/2021)   [employer] of Occupational Health - Occupational Stress Questionnaire    Feeling of Stress : Not at all  Social Connections: Moderately Integrated (11/14/2021)   Social Connection and Isolation Panel [NHANES]    Frequency of Communication with Friends and Family: Three times a week    Frequency of Social  Gatherings with Friends and Family: Three times a week    Attends Religious Services: More than 4 times per year    Active Member of Clubs or Organizations: Yes    Attends Banker Meetings: More than 4 times per year    Marital Status: Widowed  Intimate Partner Violence: Not At Risk (12/28/2022)   Humiliation, Afraid, Rape, and Kick questionnaire    Fear of Current or Ex-Partner: No    Emotionally Abused: No    Physically Abused: No    Sexually Abused: No    Review of Systems: ROS  is O/W negative except as mentioned in HPI.  Physical Exam: Vital signs in last 24 hours: Temp:  [97.5 F (36.4 C)-98 F (36.7 C)] 98 F (36.7 C) (09/26 0447) Pulse Rate:  [68-87] 87 (09/26 0005) Resp:  [15-20] 18 (09/26 0447) BP: (135-202)/(72-179) 151/88 (09/26 0447) SpO2:  [89 %-99 %] 92 % (09/26 0447) Last BM Date : 12/27/22 General:  Alert, Well-developed, well-nourished, pleasant and cooperative in NAD Head:  Normocephalic and atraumatic. Eyes:  Sclera clear, no icterus.  Conjunctiva pink. Ears:  Normal auditory acuity. Nose:  NGT in place with a small amount of green material in tubing and canister. Mouth:  No deformity or lesions.   Lungs:  Some crackles noted. Heart:  Regular rate and rhythm; no murmurs, clicks, rubs, or gallops. Abdomen:  Soft, slightly distended.  BS are present.  Mild diffuse TTP. Msk:  Symmetrical without gross deformities. Pulses:  Normal pulses noted. Extremities:  Without clubbing or edema. Neurologic:  Alert and oriented x 4;  grossly normal neurologically. Skin:  Intact without significant lesions or rashes. Psych:  Alert and cooperative. Normal mood and affect.  Intake/Output from previous day: 09/25 0701 - 09/26 0700 In: 971.2 [I.V.:721.2; IV Piggyback:250] Out: 600 [Emesis/NG output:600]  Lab Results: Recent Labs    12/25/22 1049 12/27/22 1627 12/28/22 0041  WBC 7.7 13.4* 8.2  HGB 13.2 14.5 15.8*  HCT 40.8 45.9 50.7*  PLT 241.0 245  235   BMET Recent Labs    12/25/22 1049 12/27/22 1627 12/28/22 0041  NA 143 139 137  K 4.9 4.1 4.4  CL 106 107 106  CO2 28 24 21*  GLUCOSE 95 97 133*  BUN 18 16 18   CREATININE 0.65 0.56 0.51  CALCIUM 9.9 8.3* 8.9   LFT Recent Labs    12/28/22 0041  PROT 6.6  ALBUMIN 3.6  AST 23  ALT 24  ALKPHOS 63  BILITOT 0.7    Studies/Results: DG Abd Portable 1V-Small Bowel Obstruction Protocol-initial, 8 hr delay  Result Date: 12/28/2022 CLINICAL DATA:  73 year old female undergoing evaluation for possible small bowel obstruction. Post 8 hour oral contrast administration. EXAM: PORTABLE ABDOMEN - 1 VIEW COMPARISON:  12/27/2022 radiographs and CT Abdomen and Pelvis. FINDINGS: AP view at 0516 hours. Enteric tube is looped in the proximal stomach. Oral contrast has mostly emptied from the stomach, but is difficult to identify in the abdomen or pelvis now. Unchanged nonspecific bowel gas pattern when compared to the scout view of the CT yesterday. There is some ascending and descending, rectal gas present. Stable cholecystectomy clips. Over penetrated lung bases. Stable visualized osseous structures. IMPRESSION: Stable enteric tube looped in the stomach. Oral contrast mostly exited the stomach, but now difficult to identify in the abdomen or pelvis. Unchanged nonspecific bowel gas pattern from the scout view of the CT yesterday. Electronically Signed   By: Odessa Fleming M.D.   On: 12/28/2022 06:18   DG Abd Portable 1V-Small Bowel Protocol-Position Verification  Result Date: 12/27/2022 CLINICAL DATA:  NG tube placement. EXAM: PORTABLE ABDOMEN - 1 VIEW COMPARISON:  12/27/2022. FINDINGS: The bowel gas pattern is normal. An enteric tube terminates in the stomach and contrast is present at the gastric fundus. Surgical clips are noted in the right upper quadrant. There is atherosclerotic calcification of the aorta. No radio-opaque calculi or other acute radiographic abnormality are seen. IMPRESSION: Enteric  tube terminates in the stomach and contrast is present at the gastric fundus. Electronically Signed   By: Charlestine Night.D.  On: 12/27/2022 22:52   CT ABDOMEN PELVIS WO CONTRAST  Result Date: 12/27/2022 CLINICAL DATA:  Acute onset left abdominal pain EXAM: CT ABDOMEN AND PELVIS WITHOUT CONTRAST TECHNIQUE: Multidetector CT imaging of the abdomen and pelvis was performed following the standard protocol without IV contrast. RADIATION DOSE REDUCTION: This exam was performed according to the departmental dose-optimization program which includes automated exposure control, adjustment of the mA and/or kV according to patient size and/or use of iterative reconstruction technique. COMPARISON:  CT chest dated 08/08/2022, CT abdomen and pelvis dated 04/24/2018 FINDINGS: Lower chest: Bibasilar subsegmental atelectasis. Spiculated right lower lobe nodule is decreased in size, measuring 10 x 8 mm (5:10), previously 15 x 10 mm. Additional right lower lobe tree-in-bud nodules. Right lower lobe subsegmental mucous plugging. No pleural effusion or pneumothorax demonstrated. Partially imaged heart size is normal. Hepatobiliary: No focal hepatic lesions. No intra or extrahepatic biliary ductal dilation. Cholecystectomy. Pancreas: No focal lesions or main ductal dilation. Spleen: Normal in size without focal abnormality. Adrenals/Urinary Tract: Unchanged 2.4 cm left adrenal nodule measures 0 HU in keeping with adenoma. No specific follow-up imaging recommended. No right adrenal nodule. No suspicious renal mass, calculi or hydronephrosis. No focal bladder wall thickening. Bladder contains a single focus of intraluminal gas. Stomach/Bowel: Mural thickening of the gastric antrum. Ileocolic anastomosis. Diffuse small bowel dilation the small bowel immediately proximal to the ileocolonic anastomosis appears decompressed. A few loops in the right lower quadrant demonstrate fecalization of intraluminal contents. Colonic diverticulosis  without acute diverticulitis. Vascular/Lymphatic: Aortic atherosclerosis. No enlarged abdominal or pelvic lymph nodes. Reproductive: No adnexal masses. Other: Small volume free fluid.  No fluid collection or free air. Musculoskeletal: No acute or abnormal lytic or blastic osseous lesions. Multilevel degenerative changes of the partially imaged thoracic and lumbar spine. Bilateral gluteal subcutaneous granulomas. IMPRESSION: 1. Diffuse small bowel dilation with fecalization of intraluminal contents in the right lower quadrant. While a transition point is not confidently identified on this noncontrast-enhanced examination, given history of prior bowel obstruction and surgery, findings are again suspicious for small-bowel obstruction. 2. Mural thickening of the gastric antrum, which may be seen in the setting of gastritis. 3. Spiculated right lower lobe nodule is decreased in size, measuring 10 x 8 mm, previously 15 x 10 mm. Additional right lower lobe tree-in-bud nodules and subsegmental mucous plugging, likely infectious/inflammatory. 4. Colonic diverticulosis without acute diverticulitis. 5. Single focus of intraluminal gas in the bladder. Recommend correlation with recent instrumentation. 6.  Aortic Atherosclerosis (ICD10-I70.0). Electronically Signed   By: Agustin Cree M.D.   On: 12/27/2022 18:54    IMPRESSION:  *Crohn's disease of the small intestine/Crohn's ileitis: Not on therapy as she has declined for years, only on observation.  Presenting with abdominal pain/distention and CT scan showing diffuse small bowel dilatation and fecalization of intraluminal contents in the right lower quadrant, no transition point is confidently identified on the noncontrast exam but given history of prior bowel obstruction findings suspicious for small bowel obstruction. *History of colon cancer status post right hemicolectomy *Cough with possible right-sided pneumonia seen on CT scan/COPD:  Says that she usually gets  pneumonia a few times a year. *Hypertension *Finding of possible gastritis on CT scan: CT scan report showed normal thickening of gastric antrum.  EGD in 2018 showed a normal stomach.  EGD performed at that time for findings of thickened appearing stomach on CT scan in the setting of small bowel obstruction at that time as well.  PLAN: -Agree with NG tube for small bowel  decompression. -Limit narcotics. -Supportive care with IV hydration, antiemetics, etc. -Appreciate surgery's input as well. -Pantoprazole 40 mg IV daily. -Could consider adding IV steroids if thought to be due to active Crohn's although specifically no bowel wall inflammation or thickening noted on noncontrast CT scan.  Would probably hold off for couple days to see if she turns around with conservative measures first.  Princella Pellegrini. Zehr  12/28/2022, 9:09 AM    [facility] Attending   I have taken an interval history, reviewed the chart and examined the patient. I agree with the Advanced Practitioner's note, impression and recommendations with the following additions:  Patient well-known to me with a history of colon cancer status post right hemicolectomy and Crohn's disease of the ileum.  She has really done well in observation mode with respect to the Crohn's disease.  At this point we all suspect that this is an adhesion related small bowel obstruction though Crohn's disease is possible.  Continue current care with nasogastric suction, appreciate surgery input.  Depending upon clinical course we may need to consider steroids as Ms. Cristi Loron mentioned above.  Not yet.  Iva Boop, MD, [facility] See Loretha Stapler on call - gastroenterology for best contact person 12/28/2022 3:58 PM

## 2022-12-28 NOTE — Hospital Course (Addendum)
Taken from H&P.  Erica [name] is a 72 y.o. female with medical history significant of stress-induced cardiomyopathy, CAD calcification, grade 1 diastolic heart failure preserved EF 60 to 65%, small bowel obstruction, Crohn's disease (declined therapy in the past), colon cancer, COPD, chronic lung nodule, hyperlipidemia, chronically smoker, vitamin D deficiency, prediabetic and chronic pain syndrome, presented to emergency department with complaining of abdominal pain and nausea.  Not passing any flatus, had a small BM in the morning of 9/25. Patient also having productive cough with some associated shortness of breath for the past 4 to 5 days. Patient reported history of gastric surgery in the past includes right-sided hemicolectomy due for colon cancer.  Of note, patient follow with outpatient GI Dr. Leone Payor, most recent colonoscopy on 7//24 with removal of 2 small polyps, stable and 2 and ileocolonic anastomosis and chronic ileitis with mild inflammation.  On initial presentation patient was stable vitals but then later became little hypoxic requiring 2 to 3 L liter of oxygen.  Labs pertinent for leukocytosis at 13.4, rest unremarkable.  UA with dipstick hemoglobin positive, trace leukocytes and many bacteria.  CT abdomen and pelvis with diffuse small bowel dilation and fecalization of intraluminal content in the right lower quadrant, no prominent transition point, based on prior history suspicious for SBO.  Spiculated right lower lobe nodule and lung with some decrease in size with some additional right lower lobe tree-in-bud nodules and subsequent mucous plugging, likely infectious/inflammatory.  General surgery was consulted, they ordered NG tube placement with SBO protocol. Patient was also started on ceftriaxone and doxycycline due to concern of pneumonia.  9/26: Vital stable, leukocytosis resolved, preliminary blood culture negative, respiratory viral panel negative, procalcitonin  negative.  General surgery initially will try conservative management, SBO protocol imaging ordered.  GI was also consulted but they do not think that this is due to Crohn's exacerbation as there was no bowel wall inflammation or thickening noted.  9/27: Hemodynamically stable, hemoglobin 12.8 on repeat, no obvious bleeding, SBO protocol imaging with contrast reached the colon and rectum and nonspecific air distention of bowel.  NG tube was removed by general surgery and patient was tolerating diet.  Had multiple bowel movements.  Patient is being discharged on 3 more days of Augmentin and doxycycline for concern of right lower lobe pneumonia.  She will continue the rest of her home medications and need to have a close follow-up with her providers for further recommendations.

## 2022-12-28 NOTE — Consult Note (Signed)
Erica [name] December 23, 1950  [id number].    Requesting MD: Rhae Hammock, MD Chief Complaint/Reason for Consult: SBO  HPI:  Erica [name] is a 72 y/o F with a PMH including, but not limited to, CAD, stress-induced cardiomyopathy, COPD, adenocarcinoma of the colon, crohn's disease (Dr. Leone Payor), back pain, chronic narcotic use, and recurrent SBO who presents with acute on chronic abdominal pain. She has chronic abdominal pain that she attributes to chron's but had acute onset of different pain yesterday, described as severe, lower abdominal pain without radiation. She reports very small BM yeserday AM described as "pellets" and flatus 1-2 days ago. She denies vomiting.  Substances: 1/2 ppd cigaretes, occasiontal EtOH, she is prescribed hydrocodone for chronic pain Surgical history: abdominal hysterectomy, c-section x3, R colectomy 2010 for adenocarcinoma (Dr. Dwain Sarna), ex lap, LOA for SBO 2010 (Dr. Johna Sheriff)   ROS: Review of Systems  All other systems reviewed and are negative.   Family History  Problem Relation Age of Onset   Throat cancer Mother    Colon cancer Father 27   Hypertension Father    Heart disease Father    Kidney disease Father    Colon cancer Sister 13   Liver cancer Sister    Other Sister        amyloidosis   Arthritis Other        Parent, other relative   Stomach cancer Neg Hx    Rectal cancer Neg Hx    Breast cancer Neg Hx     Past Medical History:  Diagnosis Date   Acute duodenitis 04/24/2017   Allergy    ANEMIA    Anxiety    BACK PAIN, LUMBAR    Cancer of ascending colon [id number] s/p colectomy 03/05/2009 02/18/2010   Qualifier: Diagnosis of  By: Leone Payor MD, Alfonse Ras E    CHF (congestive heart failure) (HCC)    Complete rotator cuff tear of left shoulder 11/27/2013   Ultrasound guided injection on November 27, 2013    COPD (chronic obstructive pulmonary disease) with emphysema (HCC)    COPD exacerbation (HCC) 11/14/2017   Crohn's 02/2010   ileal  ulcers, intol of entercort--refuses treatment   Emphysema of lung (HCC)    GERD    History of blood transfusion    w/ c/s surgery   History of transfusion of whole blood    HYPERLIPIDEMIA    HYPERTENSION    Microscopic hematuria    chronic   Myocardial infarction (HCC) 2010   Narcotic dependence (HCC) 05/25/2017   Obstruction of intestine or colon (HCC)    adhesions   OSTEOARTHRITIS    Rectal fissure    Possible fissure   Rotator cuff tear    Takotsubo syndrome 12/2008   URINARY INCONTINENCE     Past Surgical History:  Procedure Laterality Date   ABDOMINAL HYSTERECTOMY  1994   APPENDECTOMY  1964   BLADDER SUSPENSION     CESAREAN SECTION     x 3   CHOLECYSTECTOMY  1995   COLONOSCOPY  2017   COLONOSCOPY W/ BIOPSIES AND POLYPECTOMY  01/24/2011   (Crohn's)ileitis, internal hemorrhoids   ECTOPIC PREGNANCY SURGERY     ESOPHAGOGASTRODUODENOSCOPY     FACIAL COSMETIC SURGERY     HAND SURGERY Bilateral 1991   x 2   KNEE SURGERY Right 1981   LYSIS OF ADHESION  2010   ex lap/LOA for SBO Dr Johna Sheriff   ORBITAL FRACTURE SURGERY  1990   RIGHT COLECTOMY  03/2009   Right colectomy  for colon CA.  Dr Dwain Sarna   TONSILLECTOMY  1952   TOTAL KNEE ARTHROPLASTY  03/2010   Dr Charlann Boxer.  Depuy   UPPER GASTROINTESTINAL ENDOSCOPY  05/16/2010   normal   UTERINE SUSPENSION     VIDEO BRONCHOSCOPY Bilateral 12/18/2014   Procedure: VIDEO BRONCHOSCOPY WITHOUT FLUORO;  Surgeon: Nyoka Cowden, MD;  Location: [facility];  Service: Cardiopulmonary;  Laterality: Bilateral;    Social History:  reports that she has been smoking cigarettes. She has a 53 pack-year smoking history. She has never been exposed to tobacco smoke. She has never used smokeless tobacco. She reports current alcohol use. She reports that she does not use drugs.  Allergies:  Allergies  Allergen Reactions   Iodinated Contrast Media Itching    She had some itching after lumbar epidural injection in ipsilateral lower extremity  12/07/22.  Today she had itching in her ipsilateral upper extremity after a cervical epidural injection.   Morphine Nausea And Vomiting    Medications Prior to Admission  Medication Sig Dispense Refill   albuterol (PROVENTIL) (2.5 MG/3ML) 0.083% nebulizer solution Take 3 mLs (2.5 mg total) by nebulization every 6 (six) hours as needed for wheezing or shortness of breath. 540 mL 1   albuterol (VENTOLIN HFA) 108 (90 Base) MCG/ACT inhaler INHALE 2 PUFFS BY MOUTH EVERY 6 HOURS AS NEEDED FOR WHEEZING FOR SHORTNESS OF BREATH 8 g 3   aspirin EC 81 MG tablet Take 81 mg by mouth daily.     cyclobenzaprine (FLEXERIL) 10 MG tablet Take 1 tablet by mouth three times daily as needed for muscle spasm 90 tablet 0   fluticasone (FLONASE) 50 MCG/ACT nasal spray Place 2 sprays into both nostrils daily. 16 g 3   HYDROcodone-acetaminophen (NORCO) 5-325 MG tablet Take 1 tablet by mouth 2 (two) times daily as needed for severe pain. 60 tablet 0   [START ON 12/30/2022] HYDROcodone-acetaminophen (NORCO) 5-325 MG tablet Take 1 tablet by mouth 2 (two) times daily as needed for severe pain. 60 tablet 0   [START ON 01/29/2023] HYDROcodone-acetaminophen (NORCO) 5-325 MG tablet Take 1 tablet by mouth 2 (two) times daily as needed for severe pain. 60 tablet 0   hydrOXYzine (ATARAX) 10 MG tablet Take 1 tablet (10 mg total) by mouth 3 (three) times daily as needed for anxiety. 30 tablet 0   metoprolol tartrate (LOPRESSOR) 25 MG tablet TAKE 1 TABLET BY MOUTH TWICE DAILY --PLEASE  CALL  OUR  OFFICE  TO  SCHEDULE  AN  APPT  FOR  FUTURE  REFILLS  --2ND  ATTEMPT 60 tablet 1   rosuvastatin (CRESTOR) 10 MG tablet Take 1 tablet (10 mg total) by mouth 2 (two) times a week. Patient takes 10mg  on Mondays and 10mg  on Fridays. 24 tablet 3   Vitamin D, Ergocalciferol, (DRISDOL) 1.25 MG (50000 UNIT) CAPS capsule Take 1 capsule by mouth once a week 12 capsule 0     Physical Exam: Blood pressure (!) 151/88, pulse 87, temperature 98 F (36.7 C),  temperature source Oral, resp. rate 18, SpO2 92%. General: cooperative, chronically ill appearing female, standing in her room, appears stated age, NAD. HEENT: head -normocephalic, atraumatic; Eyes: PERRLA, no conjunctival injection; anicteric sclerae Neck- Trachea is midline CV- RRR, normal S1/S2, no M/R/G, no lower extremity edema  Pulm- breathing is non-labored on nasal cannula, CTABL, wet cough with coarse upper airway sounds  Abd- soft, overal non-tender and non-distended, previous lower midline laparotomy site noted. NG in place draining bilious effluent.(Per RN ~600  mL overnight) GU- deferred  MSK- UE/LE symmetrica Neuro- CN II-XII grossly in tact, no paresthesias. Psych- Alert and Oriented x3 with appropriate affect Skin: warm and dry, no rashes or lesions   Results for orders placed or performed during the hospital encounter of 12/27/22 (from the past 48 hour(s))  CBC with Differential     Status: Abnormal   Collection Time: 12/27/22  4:27 PM  Result Value Ref Range   WBC 13.4 (H) 4.0 - 10.5 K/uL   RBC 4.62 3.87 - 5.11 MIL/uL   Hemoglobin 14.5 12.0 - 15.0 g/dL   HCT 16.1 09.6 - 04.5 %   MCV 99.4 80.0 - 100.0 fL   MCH 31.4 26.0 - 34.0 pg   MCHC 31.6 30.0 - 36.0 g/dL   RDW 40.9 81.1 - 91.4 %   Platelets 245 150 - 400 K/uL   nRBC 0.0 0.0 - 0.2 %   Neutrophils Relative % 76 %   Neutro Abs 10.1 (H) 1.7 - 7.7 K/uL   Lymphocytes Relative 15 %   Lymphs Abs 2.0 0.7 - 4.0 K/uL   Monocytes Relative 7 %   Monocytes Absolute 1.0 0.1 - 1.0 K/uL   Eosinophils Relative 1 %   Eosinophils Absolute 0.1 0.0 - 0.5 K/uL   Basophils Relative 0 %   Basophils Absolute 0.1 0.0 - 0.1 K/uL   Immature Granulocytes 1 %   Abs Immature Granulocytes 0.15 (H) 0.00 - 0.07 K/uL    Comment: Performed at [facility], [address], [place], [place] [address]  Comprehensive metabolic panel     Status: Abnormal   Collection Time: 12/27/22  4:27 PM  Result Value Ref Range   Sodium  139 135 - 145 mmol/L   Potassium 4.1 3.5 - 5.1 mmol/L    Comment: HEMOLYSIS AT THIS LEVEL MAY AFFECT RESULT   Chloride 107 98 - 111 mmol/L   CO2 24 22 - 32 mmol/L   Glucose, Bld 97 70 - 99 mg/dL    Comment: Glucose reference range applies only to samples taken after fasting for at least 8 hours.   BUN 16 8 - 23 mg/dL   Creatinine, Ser 6.21 0.44 - 1.00 mg/dL   Calcium 8.3 (L) 8.9 - 10.3 mg/dL   Total Protein 6.3 (L) 6.5 - 8.1 g/dL   Albumin 3.5 3.5 - 5.0 g/dL   AST 22 15 - 41 U/L    Comment: HEMOLYSIS AT THIS LEVEL MAY AFFECT RESULT   ALT 19 0 - 44 U/L    Comment: HEMOLYSIS AT THIS LEVEL MAY AFFECT RESULT   Alkaline Phosphatase 49 38 - 126 U/L   Total Bilirubin 0.8 0.3 - 1.2 mg/dL    Comment: HEMOLYSIS AT THIS LEVEL MAY AFFECT RESULT   GFR, Estimated >60 >60 mL/min    Comment: (NOTE) Calculated using the CKD-EPI Creatinine Equation (2021)    Anion gap 8 5 - 15    Comment: Performed at [facility], [address] [place], [place] [address]  Lipase, blood     Status: None   Collection Time: 12/27/22  4:27 PM  Result Value Ref Range   Lipase 23 11 - 51 U/L    Comment: Performed at [facility], [address], [place], [place] [address]  Procalcitonin     Status: None   Collection Time: 12/27/22  4:27 PM  Result Value Ref Range   Procalcitonin <0.10 ng/mL    Comment:  Interpretation: PCT (Procalcitonin) <= 0.5 ng/mL: Systemic infection (sepsis) is not likely. Local bacterial infection is possible. (NOTE)       Sepsis PCT Algorithm           Lower Respiratory Tract                                      Infection PCT Algorithm    ----------------------------     ----------------------------         PCT < 0.25 ng/mL                PCT < 0.10 ng/mL          Strongly encourage             Strongly discourage   discontinuation of antibiotics    initiation of antibiotics    ----------------------------      -----------------------------       PCT 0.25 - 0.50 ng/mL            PCT 0.10 - 0.25 ng/mL               OR       >80% decrease in PCT            Discourage initiation of                                            antibiotics      Encourage discontinuation           of antibiotics    ----------------------------     -----------------------------         PCT >= 0.50 ng/mL              PCT 0.26 - 0.50 ng/mL               AND        <80% decrease in PCT             Encourage initiation of                                             antibiotics       Encourage continuation           of antibiotics    ----------------------------     -----------------------------        PCT >= 0.50 ng/mL                  PCT > 0.50 ng/mL               AND         increase in PCT                  Strongly encourage                                      initiation of antibiotics    Strongly encourage escalation           of antibiotics                                     -----------------------------  PCT <= 0.25 ng/mL                                                 OR                                        > 80% decrease in PCT                                      Discontinue / Do not initiate                                             antibiotics  Performed at [facility], [address] [place], [place] [address]   Urinalysis, Routine w reflex microscopic -Urine, Clean Catch     Status: Abnormal   Collection Time: 12/27/22  6:17 PM  Result Value Ref Range   Color, Urine YELLOW YELLOW   APPearance CLEAR CLEAR   Specific Gravity, Urine 1.021 1.005 - 1.030   pH 5.0 5.0 - 8.0   Glucose, UA NEGATIVE NEGATIVE mg/dL   Hgb urine dipstick MODERATE (A) NEGATIVE   Bilirubin Urine NEGATIVE NEGATIVE   Ketones, ur NEGATIVE NEGATIVE mg/dL   Protein, ur NEGATIVE NEGATIVE mg/dL   Nitrite NEGATIVE NEGATIVE   Leukocytes,Ua TRACE (A) NEGATIVE   RBC /  HPF 6-10 0 - 5 RBC/hpf   WBC, UA 0-5 0 - 5 WBC/hpf   Bacteria, UA MANY (A) NONE SEEN   Squamous Epithelial / HPF 0-5 0 - 5 /HPF   Mucus PRESENT    Hyaline Casts, UA PRESENT    Ca Oxalate Crys, UA PRESENT     Comment: Performed at [facility], [address], [place], [place] [address]  Culture, blood (Routine X 2) w Reflex to ID Panel     Status: None (Preliminary result)   Collection Time: 12/27/22  8:43 PM   Specimen: BLOOD LEFT ARM  Result Value Ref Range   Specimen Description      BLOOD LEFT ARM Performed at [facility], [address], [place], [place] [address]    Special Requests      BOTTLES DRAWN AEROBIC AND ANAEROBIC Blood Culture adequate volume Performed at [facility], [address] [place], [place] [address]    Culture      NO GROWTH < 12 HOURS Performed at [facility], [address] [place], [place] [address]    Report Status PENDING   Respiratory (~20 pathogens) panel by PCR     Status: None   Collection Time: 12/27/22  9:33 PM   Specimen: Nasopharyngeal Swab; Respiratory  Result Value Ref Range   Adenovirus NOT DETECTED NOT DETECTED   Coronavirus 229E NOT DETECTED NOT DETECTED    Comment: (NOTE) The Coronavirus on the Respiratory Panel, DOES NOT test for the novel  Coronavirus (2019 nCoV)    Coronavirus HKU1 NOT DETECTED NOT DETECTED   Coronavirus NL63 NOT DETECTED NOT DETECTED   Coronavirus OC43 NOT DETECTED NOT DETECTED  Metapneumovirus NOT DETECTED NOT DETECTED   Rhinovirus / Enterovirus NOT DETECTED NOT DETECTED   Influenza A NOT DETECTED NOT DETECTED   Influenza B NOT DETECTED NOT DETECTED   Parainfluenza Virus 1 NOT DETECTED NOT DETECTED   Parainfluenza Virus 2 NOT DETECTED NOT DETECTED   Parainfluenza Virus 3 NOT DETECTED NOT DETECTED   Parainfluenza Virus 4 NOT DETECTED NOT DETECTED   Respiratory Syncytial Virus NOT DETECTED NOT DETECTED   Bordetella pertussis NOT DETECTED NOT DETECTED    Bordetella Parapertussis NOT DETECTED NOT DETECTED   Chlamydophila pneumoniae NOT DETECTED NOT DETECTED   Mycoplasma pneumoniae NOT DETECTED NOT DETECTED    Comment: Performed at [facility], [address] [place], [place] [address]  CBC     Status: Abnormal   Collection Time: 12/28/22 12:41 AM  Result Value Ref Range   WBC 8.2 4.0 - 10.5 K/uL   RBC 5.17 (H) 3.87 - 5.11 MIL/uL   Hemoglobin 15.8 (H) 12.0 - 15.0 g/dL   HCT 60.4 (H) 54.0 - 98.1 %   MCV 98.1 80.0 - 100.0 fL   MCH 30.6 26.0 - 34.0 pg   MCHC 31.2 30.0 - 36.0 g/dL   RDW 19.1 47.8 - 29.5 %   Platelets 235 150 - 400 K/uL   nRBC 0.0 0.0 - 0.2 %    Comment: Performed at [facility], [address], [place], [place] [address]  Comprehensive metabolic panel     Status: Abnormal   Collection Time: 12/28/22 12:41 AM  Result Value Ref Range   Sodium 137 135 - 145 mmol/L   Potassium 4.4 3.5 - 5.1 mmol/L   Chloride 106 98 - 111 mmol/L   CO2 21 (L) 22 - 32 mmol/L   Glucose, Bld 133 (H) 70 - 99 mg/dL    Comment: Glucose reference range applies only to samples taken after fasting for at least 8 hours.   BUN 18 8 - 23 mg/dL   Creatinine, Ser 8.65 0.44 - 1.00 mg/dL   Calcium 8.9 8.9 - 78.4 mg/dL   Total Protein 6.6 6.5 - 8.1 g/dL   Albumin 3.6 3.5 - 5.0 g/dL   AST 23 15 - 41 U/L   ALT 24 0 - 44 U/L   Alkaline Phosphatase 63 38 - 126 U/L   Total Bilirubin 0.7 0.3 - 1.2 mg/dL   GFR, Estimated >69 >62 mL/min    Comment: (NOTE) Calculated using the CKD-EPI Creatinine Equation (2021)    Anion gap 10 5 - 15    Comment: Performed at [facility], [address], [place], [place] [address]   DG Abd Portable 1V-Small Bowel Obstruction Protocol-initial, 8 hr delay  Result Date: 12/28/2022 CLINICAL DATA:  72 year old female undergoing evaluation for possible small bowel obstruction. Post 8 hour oral contrast administration. EXAM: PORTABLE ABDOMEN - 1 VIEW COMPARISON:  12/27/2022  radiographs and CT Abdomen and Pelvis. FINDINGS: AP view at 0516 hours. Enteric tube is looped in the proximal stomach. Oral contrast has mostly emptied from the stomach, but is difficult to identify in the abdomen or pelvis now. Unchanged nonspecific bowel gas pattern when compared to the scout view of the CT yesterday. There is some ascending and descending, rectal gas present. Stable cholecystectomy clips. Over penetrated lung bases. Stable visualized osseous structures. IMPRESSION: Stable enteric tube looped in the stomach. Oral contrast mostly exited the stomach, but now difficult to identify in the abdomen or pelvis. Unchanged nonspecific bowel gas pattern from the scout  view of the CT yesterday. Electronically Signed   By: Odessa Fleming M.D.   On: 12/28/2022 06:18   DG Abd Portable 1V-Small Bowel Protocol-Position Verification  Result Date: 12/27/2022 CLINICAL DATA:  NG tube placement. EXAM: PORTABLE ABDOMEN - 1 VIEW COMPARISON:  12/27/2022. FINDINGS: The bowel gas pattern is normal. An enteric tube terminates in the stomach and contrast is present at the gastric fundus. Surgical clips are noted in the right upper quadrant. There is atherosclerotic calcification of the aorta. No radio-opaque calculi or other acute radiographic abnormality are seen. IMPRESSION: Enteric tube terminates in the stomach and contrast is present at the gastric fundus. Electronically Signed   By: Thornell Sartorius M.D.   On: 12/27/2022 22:52   CT ABDOMEN PELVIS WO CONTRAST  Result Date: 12/27/2022 CLINICAL DATA:  Acute onset left abdominal pain EXAM: CT ABDOMEN AND PELVIS WITHOUT CONTRAST TECHNIQUE: Multidetector CT imaging of the abdomen and pelvis was performed following the standard protocol without IV contrast. RADIATION DOSE REDUCTION: This exam was performed according to the departmental dose-optimization program which includes automated exposure control, adjustment of the mA and/or kV according to patient size and/or use of  iterative reconstruction technique. COMPARISON:  CT chest dated 08/08/2022, CT abdomen and pelvis dated 04/24/2018 FINDINGS: Lower chest: Bibasilar subsegmental atelectasis. Spiculated right lower lobe nodule is decreased in size, measuring 10 x 8 mm (5:10), previously 15 x 10 mm. Additional right lower lobe tree-in-bud nodules. Right lower lobe subsegmental mucous plugging. No pleural effusion or pneumothorax demonstrated. Partially imaged heart size is normal. Hepatobiliary: No focal hepatic lesions. No intra or extrahepatic biliary ductal dilation. Cholecystectomy. Pancreas: No focal lesions or main ductal dilation. Spleen: Normal in size without focal abnormality. Adrenals/Urinary Tract: Unchanged 2.4 cm left adrenal nodule measures 0 HU in keeping with adenoma. No specific follow-up imaging recommended. No right adrenal nodule. No suspicious renal mass, calculi or hydronephrosis. No focal bladder wall thickening. Bladder contains a single focus of intraluminal gas. Stomach/Bowel: Mural thickening of the gastric antrum. Ileocolic anastomosis. Diffuse small bowel dilation the small bowel immediately proximal to the ileocolonic anastomosis appears decompressed. A few loops in the right lower quadrant demonstrate fecalization of intraluminal contents. Colonic diverticulosis without acute diverticulitis. Vascular/Lymphatic: Aortic atherosclerosis. No enlarged abdominal or pelvic lymph nodes. Reproductive: No adnexal masses. Other: Small volume free fluid.  No fluid collection or free air. Musculoskeletal: No acute or abnormal lytic or blastic osseous lesions. Multilevel degenerative changes of the partially imaged thoracic and lumbar spine. Bilateral gluteal subcutaneous granulomas. IMPRESSION: 1. Diffuse small bowel dilation with fecalization of intraluminal contents in the right lower quadrant. While a transition point is not confidently identified on this noncontrast-enhanced examination, given history of prior  bowel obstruction and surgery, findings are again suspicious for small-bowel obstruction. 2. Mural thickening of the gastric antrum, which may be seen in the setting of gastritis. 3. Spiculated right lower lobe nodule is decreased in size, measuring 10 x 8 mm, previously 15 x 10 mm. Additional right lower lobe tree-in-bud nodules and subsegmental mucous plugging, likely infectious/inflammatory. 4. Colonic diverticulosis without acute diverticulitis. 5. Single focus of intraluminal gas in the bladder. Recommend correlation with recent instrumentation. 6.  Aortic Atherosclerosis (ICD10-I70.0). Electronically Signed   By: Agustin Cree M.D.   On: 12/27/2022 18:54      Assessment/Plan Recurrent SBO in the setting of multiple abdominal surgeries and Crohn's disease. Of note, she had active crohn's on her colonoscopy in 10/2022 her ileocolonic anastomosis appeared patent and in tact.  -  afebrile, WBC 8.2 from 13.4 s/p IV abx for pneumonia  - CT yesterday w/ out contrast showed small bowel dilation with fecalization of small bowel contents and possible transition zone in the RLQ. - today her exam is overnight benign and she has an NG tube in place. There is no emergent indication for surgery. She was given SBO protocol but I cannot see any contrast on her X-ray. Will re-order the protocol. Continue NG to LIWS, ok for ice chips.    I reviewed nursing notes, hospitalist notes, last 24 h vitals and pain scores, last 48 h intake and output, last 24 h labs and trends, and last 24 h imaging results.  Adam Phenix, [facility] 12/28/2022, 9:34 AM Please see Amion for pager number during day hours 7:00am-4:30pm or 7:00am -11:30am on weekends

## 2022-12-28 NOTE — Assessment & Plan Note (Signed)
Patient has a longstanding history of Crohn's disease and has refused treatment for the past many year and remained on observation. GI was consulted but they do not think that there is any concern of exacerbation at this time as CT imaging did not show any colonic wall thickening or sign of inflammation.  They will hold on starting IV steroid for now. -Continue to monitor

## 2022-12-28 NOTE — Progress Notes (Signed)
Mobility Specialist - Progress Note   12/28/22 0937  Oxygen Therapy  SpO2 (!) 89 %  O2 Device Nasal Cannula  O2 Flow Rate (L/min) 2 L/min  Patient Activity (if Appropriate) Ambulating  Mobility  Activity Ambulated with assistance in hallway  Level of Assistance Standby assist, set-up cues, supervision of patient - no hands on  Assistive Device Front wheel walker  Distance Ambulated (ft) 480 ft  Activity Response Tolerated well  Mobility Referral Yes  $Mobility charge 1 Mobility  Mobility Specialist Start Time (ACUTE ONLY) 0914  Mobility Specialist Stop Time (ACUTE ONLY) 0934  Mobility Specialist Time Calculation (min) (ACUTE ONLY) 20 min   Pt received in bed and agreeable to mobility. No complaints during session. Pt to recliner after session with all needs met.    Pre-mobility: 98% SpO2 (2L [place]) During mobility: 89% SpO2 (2L )   Chief Technology Officer

## 2022-12-28 NOTE — Assessment & Plan Note (Signed)
-  Holding home statin as patient is n.p.o.

## 2022-12-28 NOTE — TOC Initial Note (Signed)
Transition of Care [facility]) - Initial/Assessment Note    Patient Details  Name: Erica [name] MRN: [id number] Date of Birth: [birth year]  [facility]) CM/SW Contact:    Adrian Prows, RN Phone Number: 12/28/2022, 1:03 PM  Clinical Narrative:                 TOC for d/c planning; spoke w/ pt in room; pt says she is from home and plans to return at d/c; she identified POC Heavan Durnil (son) [phone]; pt says she has glasses and upper dentures; she also has a cane; pt says she does not have HH services or home oxygen; TOC will follow.  Expected Discharge Plan: Home/Self Care Barriers to Discharge: Continued Medical Work up   Patient Goals and CMS Choice Patient states their goals for this hospitalization and ongoing recovery are:: home          Expected Discharge Plan and Services   Discharge Planning Services: CM Consult   Living arrangements for the past 2 months: Apartment                                      Prior Living Arrangements/Services Living arrangements for the past 2 months: Apartment Lives with:: Self Patient language and need for interpreter reviewed:: Yes Do you feel safe going back to the place where you live?: Yes        Care giver support system in place?: Yes (comment) Current home services: DME (cane) Criminal Activity/Legal Involvement Pertinent to Current Situation/Hospitalization: No - Comment as needed  Activities of Daily Living   ADL Screening (condition at time of admission) Does the patient have a NEW difficulty with bathing/dressing/toileting/self-feeding that is expected to last >3 days?: Yes (Initiates electronic notice to provider for possible OT consult) (pain) Does the patient have a NEW difficulty with getting in/out of bed, walking, or climbing stairs that is expected to last >3 days?: Yes (Initiates electronic notice to provider for possible PT consult) (pain) Does the patient have a NEW  difficulty with communication that is expected to last >3 days?: No Is the patient deaf or have difficulty hearing?: No Does the patient have difficulty seeing, even when wearing glasses/contacts?: No Does the patient have difficulty concentrating, remembering, or making decisions?: No  Permission Sought/Granted Permission sought to share information with : Case Manager Permission granted to share information with : Yes, Verbal Permission Granted  Share Information with NAME: Case Manager     Permission granted to share info w Relationship: Tranita Terry (son) [phone]     Emotional Assessment Appearance:: Appears stated age Attitude/Demeanor/Rapport: Gracious Affect (typically observed): Accepting Orientation: : Oriented to Self, Oriented to Place, Oriented to  Time, Oriented to Situation Alcohol / Substance Use: Not Applicable Psych Involvement: No (comment)  Admission diagnosis:  Small bowel obstruction (HCC) [K56.609] SBO (small bowel obstruction) (HCC) [K56.609] Pneumonia due to infectious organism, unspecified laterality, unspecified part of lung [J18.9] Patient Active Problem List   Diagnosis Date Noted   Chronic diastolic CHF (congestive heart failure) (HCC) 12/27/2022   Acute hypoxic respiratory failure (HCC) 12/27/2022   History of CAD (coronary artery disease) 12/27/2022   GAD (generalized anxiety disorder) 12/27/2022   Acute gastritis without bleeding 12/27/2022   Chronic pain syndrome 11/20/2022   Lung nodule 08/22/2022   Pre-diabetes 08/30/2021   Adjustment disorder 06/27/2021   Aortic atherosclerosis (HCC) 05/28/2020   Degenerative joint disease  of knee, left 04/26/2020   Trigger finger, right ring finger 12/09/2019   Acquired trigger finger of right middle finger 10/28/2019   Insomnia 07/23/2018   Adrenal mass, left (HCC) 04/24/2018   COPD (chronic obstructive pulmonary disease) (HCC)    Rotator cuff arthropathy of right shoulder 04/18/2018   Trigger  point of right shoulder region 08/29/2017   Encounter for chronic pain management 08/24/2017   Nicotine dependence 05/25/2017   SBO (small bowel obstruction) (HCC) 02/14/2017   Smokers' cough (HCC) 02/14/2017   Crohn disease (HCC) 02/14/2017   Lumbar radiculopathy, acute 12/13/2016   Vitamin D deficiency 08/17/2016   Positive anti-CCP test 06/05/2016   Weight loss 05/05/2016   Right lower lobe pneumonia 09/18/2014   Cervical disc disorder with radiculopathy of cervical region 06/08/2014   Right shoulder pain 04/20/2014   Subacromial bursitis 01/26/2014   Primary localized osteoarthrosis, lower leg 11/27/2013   Microscopic hematuria    Neck pain 01/13/2011   History of Crohn's disease 05/05/2010   History of colon cancer 02/18/2010   Hyperlipidemia 02/16/2010   GERD 02/16/2010   Osteoarthritis 02/16/2010   Essential hypertension 02/14/2010   Takotsubo syndrome 02/12/2009   PCP:  Gerre Scull, NP Pharmacy:   [facility] [address], [place] - [address] [place] [address] Phone: [phone] Fax: [phone]     Social Determinants of Health (SDOH) Social History: SDOH Screenings   Food Insecurity: No Food Insecurity (12/28/2022)  Housing: Low Risk  (12/28/2022)  Transportation Needs: No Transportation Needs (12/28/2022)  Utilities: Not At Risk (12/28/2022)  Alcohol Screen: Low Risk  (11/14/2021)  Depression (PHQ2-9): Low Risk  (11/20/2022)  Financial Resource Strain: Low Risk  (11/14/2021)  Physical Activity: Sufficiently Active (11/14/2021)  Social Connections: Moderately Integrated (11/14/2021)  Stress: No Stress Concern Present (11/14/2021)  Tobacco Use: High Risk (12/27/2022)   SDOH Interventions: Food Insecurity Interventions: Intervention Not Indicated, Inpatient TOC Housing Interventions: Intervention Not Indicated, Inpatient TOC Transportation Interventions: Intervention Not Indicated, Inpatient TOC Utilities  Interventions: Intervention Not Indicated, Inpatient TOC   Readmission Risk Interventions     No data to display

## 2022-12-28 NOTE — Progress Notes (Signed)
Progress Note   Patient: Erica [name] ZSW:[id number] DOB: November 25, 1950 DOA: 12/27/2022     1 DOS: the patient was seen and examined on 12/28/2022   Brief hospital course: Taken from H&P.  Synethia Takach is a 72 y.o. female with medical history significant of stress-induced cardiomyopathy, CAD calcification, grade 1 diastolic heart failure preserved EF 60 to 65%, small bowel obstruction, Crohn's disease (declined therapy in the past), colon cancer, COPD, chronic lung nodule, hyperlipidemia, chronically smoker, vitamin D deficiency, prediabetic and chronic pain syndrome, presented to emergency department with complaining of abdominal pain and nausea.  Not passing any flatus, had a small BM in the morning of 9/25. Patient also having productive cough with some associated shortness of breath for the past 4 to 5 days. Patient reported history of gastric surgery in the past includes right-sided hemicolectomy due for colon cancer.  Of note, patient follow with outpatient GI Dr. Leone Payor, most recent colonoscopy on 7//24 with removal of 2 small polyps, stable and 2 and ileocolonic anastomosis and chronic ileitis with mild inflammation.  On initial presentation patient was stable vitals but then later became little hypoxic requiring 2 to 3 L liter of oxygen.  Labs pertinent for leukocytosis at 13.4, rest unremarkable.  UA with dipstick hemoglobin positive, trace leukocytes and many bacteria.  CT abdomen and pelvis with diffuse small bowel dilation and fecalization of intraluminal content in the right lower quadrant, no prominent transition point, based on prior history suspicious for SBO.  Spiculated right lower lobe nodule and lung with some decrease in size with some additional right lower lobe tree-in-bud nodules and subsequent mucous plugging, likely infectious/inflammatory.  General surgery was consulted, they ordered NG tube placement with SBO protocol. Patient was also started on ceftriaxone and  doxycycline due to concern of pneumonia.  9/26: Vital stable, leukocytosis resolved, preliminary blood culture negative, respiratory viral panel negative, procalcitonin negative.  General surgery initially will try conservative management, SBO protocol imaging ordered.  GI was also consulted but they do not think that this is due to Crohn's exacerbation as there was no bowel wall inflammation or thickening noted.      Assessment and Plan: * SBO (small bowel obstruction) (HCC) History of previous SBO History of bowel surgery due to colon cancer History of Crohn's disease. S/p NG tube placement.  No bowel movement or passing flatus at this time. General surgery is on board and they will try conservative management first-ordered SBO protocol GI series. -Continue with supportive care and NG tube -Continue with pain management  Acute hypoxic respiratory failure (HCC) Currently on 2 L of oxygen, no baseline oxygen use. -Continue with supplemental oxygen-wean as tolerated  Right lower lobe pneumonia Imaging concerning for right lower lobe pneumonia, patient with some leukocytosis but that can be due to SBO.  Procalcitonin and respiratory viral panel negative.  Per patient she do get 4-5 times pneumonia each year.  Currently on 2 L of oxygen with no baseline oxygen use. -Continue ceftriaxone and doxycycline for now -Follow-up strep pneumo and Legionella antigen  History of Crohn's disease Patient has a longstanding history of Crohn's disease and has refused treatment for the past many year and remained on observation. GI was consulted but they do not think that there is any concern of exacerbation at this time as CT imaging did not show any colonic wall thickening or sign of inflammation.  They will hold on starting IV steroid for now. -Continue to monitor  Acute gastritis without bleeding Prior EGD done few  years ago was normal.  No NSAID use. -GI is recommending continuing Protonix 40 mg  IV daily. -Pending H. pylori stool antigen screening test  Chronic diastolic CHF (congestive heart failure) (HCC) History of stress-induced cardiomyopathy History of CAD  Currently appears euvolemic. -Continue home aspirin and Lopressor-once able to take p.o.  History of colon cancer S/p hemicolectomy. GI is on board  Chronic pain syndrome Takes Flexeril and Norco at home. -Currently holding as she is n.p.o.  Lung nodule Patient has history of chronic lung nodule. CT abdomen showed right lower lobe nodule decreased in size measuring 10 x 8 mm previously from 15/10 mm. Patient needs outpatient follow-up with pulmonology on DC.   COPD (chronic obstructive pulmonary disease) (HCC) No concern of exacerbation. -Continue with bronchodilator  Hyperlipidemia -Holding home statin as patient is n.p.o.  GAD (generalized anxiety disorder) Holding home Atarax as she is n.p.o.   Subjective: Patient continued to have 8/10 abdominal pain and cough.  NG tube in place with intermittent suctioning.  Physical Exam: Vitals:   12/28/22 0005 12/28/22 0447 12/28/22 0937 12/28/22 1315  BP: (!) 157/89 (!) 151/88  132/76  Pulse: 87   95  Resp: 17 18  14   Temp: (!) 97.5 F (36.4 C) 98 F (36.7 C)  98.2 F (36.8 C)  TempSrc: Oral Oral  Oral  SpO2: 99% 92% (!) 89% 97%   General.  Frail and malnourished elderly lady, in no acute distress.  NG tube in place Pulmonary.  Lungs clear bilaterally, normal respiratory effort. CV.  Regular rate and rhythm, no JVD, rub or murmur. Abdomen.  Soft, nontender, nondistended, BS positive. CNS.  Alert and oriented .  No focal neurologic deficit. Extremities.  No edema, no cyanosis, pulses intact and symmetrical. Psychiatry.  Judgment and insight appears normal.   Data Reviewed: Prior data reviewed  Family Communication:   Disposition: Status is: Inpatient Remains inpatient appropriate because: Severity of illness  Planned Discharge Destination:  Home  Time spent: 45 minutes  This record has been created using Conservation officer, historic buildings. Errors have been sought and corrected,but may not always be located. Such creation errors do not reflect on the standard of care.   Author: Arnetha Courser, MD 12/28/2022 2:58 PM  For on call review [email or web address].

## 2022-12-28 NOTE — Assessment & Plan Note (Signed)
Patient has history of chronic lung nodule. CT abdomen showed right lower lobe nodule decreased in size measuring 10 x 8 mm previously from 15/10 mm. Patient needs outpatient follow-up with pulmonology on DC.

## 2022-12-29 ENCOUNTER — Telehealth: Payer: Self-pay | Admitting: *Deleted

## 2022-12-29 DIAGNOSIS — J9601 Acute respiratory failure with hypoxia: Secondary | ICD-10-CM | POA: Diagnosis not present

## 2022-12-29 DIAGNOSIS — Z8719 Personal history of other diseases of the digestive system: Secondary | ICD-10-CM | POA: Diagnosis not present

## 2022-12-29 DIAGNOSIS — J189 Pneumonia, unspecified organism: Secondary | ICD-10-CM | POA: Diagnosis not present

## 2022-12-29 DIAGNOSIS — K50019 Crohn's disease of small intestine with unspecified complications: Secondary | ICD-10-CM | POA: Diagnosis not present

## 2022-12-29 DIAGNOSIS — E559 Vitamin D deficiency, unspecified: Secondary | ICD-10-CM

## 2022-12-29 DIAGNOSIS — K56609 Unspecified intestinal obstruction, unspecified as to partial versus complete obstruction: Secondary | ICD-10-CM | POA: Diagnosis not present

## 2022-12-29 LAB — CBC
HCT: 34.9 % — ABNORMAL LOW (ref 36.0–46.0)
HCT: 41 % (ref 36.0–46.0)
Hemoglobin: 10.8 g/dL — ABNORMAL LOW (ref 12.0–15.0)
Hemoglobin: 12.4 g/dL (ref 12.0–15.0)
MCH: 30.8 pg (ref 26.0–34.0)
MCH: 30.6 pg (ref 26.0–34.0)
MCHC: 30.9 g/dL (ref 30.0–36.0)
MCHC: 30.2 g/dL (ref 30.0–36.0)
MCV: 99.4 fL (ref 80.0–100.0)
MCV: 101.2 fL — ABNORMAL HIGH (ref 80.0–100.0)
Platelets: 177 10*3/uL (ref 150–400)
Platelets: 234 10*3/uL (ref 150–400)
RBC: 3.51 MIL/uL — ABNORMAL LOW (ref 3.87–5.11)
RBC: 4.05 MIL/uL (ref 3.87–5.11)
RDW: 13.1 % (ref 11.5–15.5)
RDW: 13 % (ref 11.5–15.5)
WBC: 7.8 10*3/uL (ref 4.0–10.5)
WBC: 11 10*3/uL — ABNORMAL HIGH (ref 4.0–10.5)
nRBC: 0 % (ref 0.0–0.2)
nRBC: 0 % (ref 0.0–0.2)

## 2022-12-29 LAB — COMPREHENSIVE METABOLIC PANEL
ALT: 15 U/L (ref 0–44)
AST: 18 U/L (ref 15–41)
Albumin: 2.9 g/dL — ABNORMAL LOW (ref 3.5–5.0)
Alkaline Phosphatase: 43 U/L (ref 38–126)
Anion gap: 9 (ref 5–15)
BUN: 24 mg/dL — ABNORMAL HIGH (ref 8–23)
CO2: 26 mmol/L (ref 22–32)
Calcium: 8.7 mg/dL — ABNORMAL LOW (ref 8.9–10.3)
Chloride: 108 mmol/L (ref 98–111)
Creatinine, Ser: 0.61 mg/dL (ref 0.44–1.00)
GFR, Estimated: >60 mL/min (ref >60)
Glucose, Bld: 94 mg/dL (ref 70–99)
Potassium: 4.4 mmol/L (ref 3.5–5.1)
Sodium: 143 mmol/L (ref 135–145)
Total Bilirubin: 0.7 mg/dL (ref 0.3–1.2)
Total Protein: 5.2 g/dL — ABNORMAL LOW (ref 6.5–8.1)

## 2022-12-29 MED ORDER — DOXYCYCLINE HYCLATE 100 MG PO TABS: 100.0000 mg | ORAL_TABLET | Freq: Two times a day (BID) | ORAL | 0 refills | Status: AC

## 2022-12-29 MED ORDER — ESOMEPRAZOLE MAGNESIUM 20 MG PO CPDR: 20.0000 mg | DELAYED_RELEASE_CAPSULE | Freq: Every day | ORAL | 1 refills | Status: DC

## 2022-12-29 MED ORDER — ACETAMINOPHEN 160 MG/5ML PO SOLN: 650.0000 mg | Freq: Four times a day (QID) | ORAL | Status: DC | PRN

## 2022-12-29 MED ORDER — ACETAMINOPHEN 650 MG RE SUPP: 650.0000 mg | Freq: Four times a day (QID) | RECTAL | Status: DC | PRN

## 2022-12-29 MED ORDER — BOOST / RESOURCE BREEZE PO LIQD CUSTOM
1.0000 | Freq: Three times a day (TID) | ORAL | Status: DC
  Administered 2022-12-29: 1 via ORAL

## 2022-12-29 MED ORDER — HYDROCODONE-ACETAMINOPHEN 5-325 MG PO TABS
1.0000 | ORAL_TABLET | Freq: Four times a day (QID) | ORAL | Status: DC | PRN
  Administered 2022-12-29: 1 via ORAL
  Filled 2022-12-29: qty 1

## 2022-12-29 MED ORDER — GUAIFENESIN 100 MG/5ML PO LIQD: 15.0000 mL | Freq: Four times a day (QID) | ORAL | Status: DC

## 2022-12-29 MED ORDER — AMOXICILLIN-POT CLAVULANATE 875-125 MG PO TABS: 1.0000 | ORAL_TABLET | Freq: Two times a day (BID) | ORAL | 0 refills | Status: AC

## 2022-12-29 NOTE — Progress Notes (Signed)
[facility] Progress Note     Subjective: CC:  Has had over 8 BMs last 24 hours. Feels her abdominal pain is improving, more like her baseline pain. Also c/o back discomfort. NG low output  Objective: Vital signs in last 24 hours: Temp:  [98.1 F (36.7 C)-98.2 F (36.8 C)] 98.1 F (36.7 C) (09/27 0547) Pulse Rate:  [80-103] 80 (09/27 0547) Resp:  [14-18] 18 (09/27 0547) BP: (130-138)/(76-89) 138/76 (09/27 0547) SpO2:  [80 %-97 %] 92 % (09/27 0547) Last BM Date : 12/29/22  Intake/Output from previous day: 09/26 0701 - 09/27 0700 In: 1682.1 [I.V.:1082; IV Piggyback:600] Out: 100 [Emesis/NG output:100] Intake/Output this shift: No intake/output data recorded.  PE: Gen:  Alert, NAD, pleasant and cooperative  Card:  Regular rate and rhythm, pedal pulses 2+ BL Pulm:  Normal effort, clear to auscultation bilaterally Abd: Soft, non-tender, mild distention, lower laparotomy scar noted  Skin: warm and dry, no rashes  Psych: A&Ox3   Lab Results:  Recent Labs    12/28/22 0041 12/29/22 0406  WBC 8.2 7.8  HGB 15.8* 10.8*  HCT 50.7* 34.9*  PLT 235 177   BMET Recent Labs    12/28/22 0041 12/29/22 0406  NA 137 143  K 4.4 4.4  CL 106 108  CO2 21* 26  GLUCOSE 133* 94  BUN 18 24*  CREATININE 0.51 0.61  CALCIUM 8.9 8.7*   PT/INR No results for input(s): "LABPROT", "INR" in the last 72 hours. CMP     Component Value Date/Time   NA 143 12/29/2022 0406   NA 141 08/12/2020 0901   K 4.4 12/29/2022 0406   CL 108 12/29/2022 0406   CO2 26 12/29/2022 0406   GLUCOSE 94 12/29/2022 0406   BUN 24 (H) 12/29/2022 0406   BUN 14 08/12/2020 0901   CREATININE 0.61 12/29/2022 0406   CALCIUM 8.7 (L) 12/29/2022 0406   PROT 5.2 (L) 12/29/2022 0406   PROT 6.5 08/12/2020 0901   ALBUMIN 2.9 (L) 12/29/2022 0406   ALBUMIN 4.6 08/12/2020 0901   AST 18 12/29/2022 0406   ALT 15 12/29/2022 0406   ALKPHOS 43 12/29/2022 0406   BILITOT 0.7 12/29/2022 0406   BILITOT 0.5  08/12/2020 0901   GFRNONAA >60 12/29/2022 0406   GFRAA >60 04/28/2018 0359   Lipase     Component Value Date/Time   LIPASE 23 12/27/2022 1627       Studies/Results: DG Abd Portable 1V-Small Bowel Obstruction Protocol-initial, 8 hr delay  Result Date: 12/28/2022 CLINICAL DATA:  Small-bowel obstruction 8 hour delay EXAM: PORTABLE ABDOMEN - 1 VIEW COMPARISON:  12/28/2022, CT 12/27/2022 FINDINGS: Enteric tube tip at the proximal stomach. Similar nonspecific air distension of bowel. Radiopaque contrast within the colon and rectum. IMPRESSION: Similar nonspecific air distension of bowel. Contrast material has reached the colon and rectum Electronically Signed   By: Jasmine Pang M.D.   On: 12/28/2022 23:29   DG Abd Portable 1V-Small Bowel Obstruction Protocol-initial, 8 hr delay  Result Date: 12/28/2022 CLINICAL DATA:  72 year old female undergoing evaluation for possible small bowel obstruction. Post 8 hour oral contrast administration. EXAM: PORTABLE ABDOMEN - 1 VIEW COMPARISON:  12/27/2022 radiographs and CT Abdomen and Pelvis. FINDINGS: AP view at 0516 hours. Enteric tube is looped in the proximal stomach. Oral contrast has mostly emptied from the stomach, but is difficult to identify in the abdomen or pelvis now. Unchanged nonspecific bowel gas pattern when compared to the scout view of the CT yesterday. There is some ascending  and descending, rectal gas present. Stable cholecystectomy clips. Over penetrated lung bases. Stable visualized osseous structures. IMPRESSION: Stable enteric tube looped in the stomach. Oral contrast mostly exited the stomach, but now difficult to identify in the abdomen or pelvis. Unchanged nonspecific bowel gas pattern from the scout view of the CT yesterday. Electronically Signed   By: Odessa Fleming M.D.   On: 12/28/2022 06:18   DG Abd Portable 1V-Small Bowel Protocol-Position Verification  Result Date: 12/27/2022 CLINICAL DATA:  NG tube placement. EXAM: PORTABLE ABDOMEN  - 1 VIEW COMPARISON:  12/27/2022. FINDINGS: The bowel gas pattern is normal. An enteric tube terminates in the stomach and contrast is present at the gastric fundus. Surgical clips are noted in the right upper quadrant. There is atherosclerotic calcification of the aorta. No radio-opaque calculi or other acute radiographic abnormality are seen. IMPRESSION: Enteric tube terminates in the stomach and contrast is present at the gastric fundus. Electronically Signed   By: Thornell Sartorius M.D.   On: 12/27/2022 22:52   CT ABDOMEN PELVIS WO CONTRAST  Result Date: 12/27/2022 CLINICAL DATA:  Acute onset left abdominal pain EXAM: CT ABDOMEN AND PELVIS WITHOUT CONTRAST TECHNIQUE: Multidetector CT imaging of the abdomen and pelvis was performed following the standard protocol without IV contrast. RADIATION DOSE REDUCTION: This exam was performed according to the departmental dose-optimization program which includes automated exposure control, adjustment of the mA and/or kV according to patient size and/or use of iterative reconstruction technique. COMPARISON:  CT chest dated 08/08/2022, CT abdomen and pelvis dated 04/24/2018 FINDINGS: Lower chest: Bibasilar subsegmental atelectasis. Spiculated right lower lobe nodule is decreased in size, measuring 10 x 8 mm (5:10), previously 15 x 10 mm. Additional right lower lobe tree-in-bud nodules. Right lower lobe subsegmental mucous plugging. No pleural effusion or pneumothorax demonstrated. Partially imaged heart size is normal. Hepatobiliary: No focal hepatic lesions. No intra or extrahepatic biliary ductal dilation. Cholecystectomy. Pancreas: No focal lesions or main ductal dilation. Spleen: Normal in size without focal abnormality. Adrenals/Urinary Tract: Unchanged 2.4 cm left adrenal nodule measures 0 HU in keeping with adenoma. No specific follow-up imaging recommended. No right adrenal nodule. No suspicious renal mass, calculi or hydronephrosis. No focal bladder wall thickening.  Bladder contains a single focus of intraluminal gas. Stomach/Bowel: Mural thickening of the gastric antrum. Ileocolic anastomosis. Diffuse small bowel dilation the small bowel immediately proximal to the ileocolonic anastomosis appears decompressed. A few loops in the right lower quadrant demonstrate fecalization of intraluminal contents. Colonic diverticulosis without acute diverticulitis. Vascular/Lymphatic: Aortic atherosclerosis. No enlarged abdominal or pelvic lymph nodes. Reproductive: No adnexal masses. Other: Small volume free fluid.  No fluid collection or free air. Musculoskeletal: No acute or abnormal lytic or blastic osseous lesions. Multilevel degenerative changes of the partially imaged thoracic and lumbar spine. Bilateral gluteal subcutaneous granulomas. IMPRESSION: 1. Diffuse small bowel dilation with fecalization of intraluminal contents in the right lower quadrant. While a transition point is not confidently identified on this noncontrast-enhanced examination, given history of prior bowel obstruction and surgery, findings are again suspicious for small-bowel obstruction. 2. Mural thickening of the gastric antrum, which may be seen in the setting of gastritis. 3. Spiculated right lower lobe nodule is decreased in size, measuring 10 x 8 mm, previously 15 x 10 mm. Additional right lower lobe tree-in-bud nodules and subsegmental mucous plugging, likely infectious/inflammatory. 4. Colonic diverticulosis without acute diverticulitis. 5. Single focus of intraluminal gas in the bladder. Recommend correlation with recent instrumentation. 6.  Aortic Atherosclerosis (ICD10-I70.0). Electronically Signed   By:  Agustin Cree M.D.   On: 12/27/2022 18:54    Anti-infectives: Anti-infectives (From admission, onward)    Start     Dose/Rate Route Frequency Ordered Stop   12/28/22 2000  cefTRIAXone (ROCEPHIN) 1 g in sodium chloride 0.9 % 100 mL IVPB        1 g 200 mL/hr over 30 Minutes Intravenous Every 24 hours  12/27/22 2005 01/03/23 1959   12/28/22 0800  doxycycline (VIBRAMYCIN) 100 mg in sodium chloride 0.9 % 250 mL IVPB        100 mg 125 mL/hr over 120 Minutes Intravenous Every 12 hours 12/27/22 2005 01/03/23 0759   12/28/22 0000  cefTRIAXone (ROCEPHIN) 1 g in sodium chloride 0.9 % 100 mL IVPB  Status:  Discontinued        1 g 200 mL/hr over 30 Minutes Intravenous Every 24 hours 12/27/22 2005 12/27/22 2005   12/27/22 2000  doxycycline (VIBRAMYCIN) 100 mg in sodium chloride 0.9 % 250 mL IVPB        100 mg 125 mL/hr over 120 Minutes Intravenous  Once 12/27/22 1932 12/27/22 2258   12/27/22 1945  cefTRIAXone (ROCEPHIN) 1 g in sodium chloride 0.9 % 100 mL IVPB        1 g 200 mL/hr over 30 Minutes Intravenous  Once 12/27/22 1932 12/27/22 2049        Assessment/Plan  Recurrent SBO in the setting of multiple abdominal surgeries and Crohn's disease. Of note, she had active crohn's on her colonoscopy in 10/2022 her ileocolonic anastomosis appeared patent and in tact.  - afebrile, WBC 7.8 - CT 9/25 w/ out contrast showed small bowel dilation with fecalization of small bowel contents and possible transition zone in the RLQ. - SBO protocol >> contrast in colon. She is having flatus and multiple stools. Minimal NG output. Abdominal pain improving. NG tube removed by me. Start CLD. Would consider advancing to FLD later today but would not advance to solids. No emergent surgical needs. We will follow.  - appreciate GI following along, not currently on steroids for crohn's    LOS: 2 days   I reviewed nursing notes, Consultant GI notes, hospitalist notes, last 24 h vitals and pain scores, last 48 h intake and output, last 24 h labs and trends, and last 24 h imaging results.  This care required moderate level of medical decision making.   Hosie Spangle, PA-C [facility] Please see Amion for pager number during day hours 7:00am-4:30pm

## 2022-12-29 NOTE — Progress Notes (Signed)
AVS reviewed w/ pt  who verbalized an understanding - no other questions at this time

## 2022-12-29 NOTE — Plan of Care (Signed)

## 2022-12-29 NOTE — Progress Notes (Signed)
D/C ambulatory w all belongings in stable condtion.

## 2022-12-29 NOTE — Progress Notes (Signed)
Pt 's son will not be available to pick up pt until 2130, he is arranging to try and have an uber pick up pt or have a friend pick her up- pt does not have the keys to her home. The keys are with her son. This RN also offered to have home discharge medications changed to [facility] so [place] would have meds for tonight- pt declined. Pt verbalized an understanding that 2 of the medications were antibiotics and she should have a dose tonight- pt continued to decline. Pt's primary nurse undated on delay

## 2022-12-29 NOTE — Discharge Summary (Signed)
Physician Discharge Summary   Patient: Erica [name] MRN: [id number] DOB: 01-16-1951  Admit date:     12/27/2022  Discharge date: 12/29/22  Discharge Physician: Arnetha Courser   PCP: Gerre Scull, NP   Recommendations at discharge:  Please obtain CBC and BMP on follow-up Please ensure the completion of antibiotics Follow-up with primary care provider within a week  Discharge Diagnoses: Principal Problem:   SBO (small bowel obstruction) (HCC) Active Problems:   Pneumonia due to infectious organism   Acute hypoxic respiratory failure (HCC)   History of Crohn's disease   Acute gastritis without bleeding   Chronic diastolic CHF (congestive heart failure) (HCC)   History of colon cancer   Chronic pain syndrome   Lung nodule   COPD (chronic obstructive pulmonary disease) (HCC)   Hyperlipidemia   GAD (generalized anxiety disorder)   Vitamin D deficiency   History of CAD (coronary artery disease)   Hospital Course: Taken from H&P.  Erica [name] is a 72 y.o. female with medical history significant of stress-induced cardiomyopathy, CAD calcification, grade 1 diastolic heart failure preserved EF 60 to 65%, small bowel obstruction, Crohn's disease (declined therapy in the past), colon cancer, COPD, chronic lung nodule, hyperlipidemia, chronically smoker, vitamin D deficiency, prediabetic and chronic pain syndrome, presented to emergency department with complaining of abdominal pain and nausea.  Not passing any flatus, had a small BM in the morning of 9/25. Patient also having productive cough with some associated shortness of breath for the past 4 to 5 days. Patient reported history of gastric surgery in the past includes right-sided hemicolectomy due for colon cancer.  Of note, patient follow with outpatient GI Dr. Leone Payor, most recent colonoscopy on 7//24 with removal of 2 small polyps, stable and 2 and ileocolonic anastomosis and chronic ileitis with mild inflammation.  On  initial presentation patient was stable vitals but then later became little hypoxic requiring 2 to 3 L liter of oxygen.  Labs pertinent for leukocytosis at 13.4, rest unremarkable.  UA with dipstick hemoglobin positive, trace leukocytes and many bacteria.  CT abdomen and pelvis with diffuse small bowel dilation and fecalization of intraluminal content in the right lower quadrant, no prominent transition point, based on prior history suspicious for SBO.  Spiculated right lower lobe nodule and lung with some decrease in size with some additional right lower lobe tree-in-bud nodules and subsequent mucous plugging, likely infectious/inflammatory.  General surgery was consulted, they ordered NG tube placement with SBO protocol. Patient was also started on ceftriaxone and doxycycline due to concern of pneumonia.  9/26: Vital stable, leukocytosis resolved, preliminary blood culture negative, respiratory viral panel negative, procalcitonin negative.  General surgery initially will try conservative management, SBO protocol imaging ordered.  GI was also consulted but they do not think that this is due to Crohn's exacerbation as there was no bowel wall inflammation or thickening noted.  9/27: Hemodynamically stable, hemoglobin 12.8 on repeat, no obvious bleeding, SBO protocol imaging with contrast reached the colon and rectum and nonspecific air distention of bowel.  NG tube was removed by general surgery and patient was tolerating diet.  Had multiple bowel movements.  Patient is being discharged on 3 more days of Augmentin and doxycycline for concern of right lower lobe pneumonia.  She will continue the rest of her home medications and need to have a close follow-up with her providers for further recommendations.     Assessment and Plan: * SBO (small bowel obstruction) (HCC) History of previous SBO History of bowel  surgery due to colon cancer History of Crohn's disease. S/p NG tube placement.  No bowel  movement or passing flatus at this time. General surgery was consulted and patient underwent SBO protocol imaging with Gastrografin seen throughout the intestine.  Nausea and vomiting resolved and NG tube was discontinued.  Patient was able to tolerate advancement in diet without any difficulty and having bowel movements.  Acute hypoxic respiratory failure (HCC) Currently on 2 L of oxygen, no baseline oxygen use. -Able to wean back to room air.  Pneumonia due to infectious organism Imaging concerning for right lower lobe pneumonia, patient with some leukocytosis but that can be due to SBO.  Procalcitonin and respiratory viral panel negative.  Per patient she do get 4-5 times pneumonia each year.  Currently on 2 L of oxygen with no baseline oxygen use. -Continue ceftriaxone and doxycycline for now -Being discharged on Augmentin and doxycycline to complete a 5-day course.  History of Crohn's disease Patient has a longstanding history of Crohn's disease and has refused treatment for the past many year and remained on observation. GI was consulted but they do not think that there is any concern of exacerbation at this time as CT imaging did not show any colonic wall thickening or sign of inflammation.  They will hold on starting IV steroid for now. -Continue to monitor  Acute gastritis without bleeding Prior EGD done few years ago was normal.  No NSAID use. -GI is recommending continuing Protonix 40 mg IV daily. -Pending H. pylori stool antigen screening test  Chronic diastolic CHF (congestive heart failure) (HCC) History of stress-induced cardiomyopathy History of CAD  Currently appears euvolemic. -Continue home aspirin and Lopressor-once able to take p.o.  History of colon cancer S/p hemicolectomy. GI is on board  Chronic pain syndrome Takes Flexeril and Norco at home. -Currently holding as she is n.p.o.  Lung nodule Patient has history of chronic lung nodule. CT abdomen showed  right lower lobe nodule decreased in size measuring 10 x 8 mm previously from 15/10 mm. Patient needs outpatient follow-up with pulmonology on DC.   COPD (chronic obstructive pulmonary disease) (HCC) No concern of exacerbation. -Continue with bronchodilator  Hyperlipidemia -Holding home statin as patient is n.p.o.  GAD (generalized anxiety disorder) Holding home Atarax as she is n.p.o.  Consultants: General surgery.  Gastroenterology Procedures performed: None Disposition: Home Diet recommendation:  Discharge Diet Orders (From admission, onward)     Start     Ordered   12/29/22 0000  Diet - low sodium heart healthy        12/29/22 1333           Regular diet DISCHARGE MEDICATION: Allergies as of 12/29/2022       Reactions   Iodinated Contrast Media Itching, Other (See Comments)   She had some itching after lumbar epidural injection in ipsilateral lower extremity 12/07/22.  Today, she had itching in her ipsilateral upper extremity after a cervical epidural injection.(12/21/2022)   Morphine Nausea And Vomiting        Medication List     TAKE these medications    albuterol (2.5 MG/3ML) 0.083% nebulizer solution Commonly known as: PROVENTIL Take 3 mLs (2.5 mg total) by nebulization every 6 (six) hours as needed for wheezing or shortness of breath. What changed: Another medication with the same name was changed. Make sure you understand how and when to take each.   albuterol 108 (90 Base) MCG/ACT inhaler Commonly known as: VENTOLIN HFA INHALE 2 PUFFS BY MOUTH  EVERY 6 HOURS AS NEEDED FOR WHEEZING FOR SHORTNESS OF BREATH What changed: See the new instructions.   amoxicillin-clavulanate 875-125 MG tablet Commonly known as: AUGMENTIN Take 1 tablet by mouth 2 (two) times daily for 3 days.   aspirin EC 81 MG tablet Take 81 mg by mouth at bedtime.   cyclobenzaprine 10 MG tablet Commonly known as: FLEXERIL Take 1 tablet by mouth three times daily as needed for muscle  spasm   doxycycline 100 MG tablet Commonly known as: VIBRA-TABS Take 1 tablet (100 mg total) by mouth 2 (two) times daily for 3 days.   esomeprazole 20 MG capsule Commonly known as: NexIUM Take 1 capsule (20 mg total) by mouth daily.   fluticasone 50 MCG/ACT nasal spray Commonly known as: FLONASE Place 2 sprays into both nostrils daily. What changed:  when to take this reasons to take this   HYDROcodone-acetaminophen 5-325 MG tablet Commonly known as: Norco Take 1 tablet by mouth 2 (two) times daily as needed for severe pain. Start taking on: January 29, 2023 What changed: when to take this   hydrOXYzine 10 MG tablet Commonly known as: ATARAX Take 1 tablet (10 mg total) by mouth 3 (three) times daily as needed for anxiety.   metoprolol tartrate 25 MG tablet Commonly known as: LOPRESSOR TAKE 1 TABLET BY MOUTH TWICE DAILY --PLEASE  CALL  OUR  OFFICE  TO  SCHEDULE  AN  APPT  FOR  FUTURE  REFILLS  --2ND  ATTEMPT What changed: See the new instructions.   rosuvastatin 10 MG tablet Commonly known as: CRESTOR Take 1 tablet (10 mg total) by mouth 2 (two) times a week. Patient takes 10mg  on Mondays and 10mg  on Fridays. What changed:  when to take this additional instructions   Vitamin D (Ergocalciferol) 1.25 MG (50000 UNIT) Caps capsule Commonly known as: DRISDOL Take 1 capsule by mouth once a week What changed: when to take this        Follow-up Information     McElwee, [name], NP. Schedule an appointment as soon as possible for a visit in 1 week(s).   Specialty: Internal Medicine Contact information: [address] [place] [address] [phone]                Discharge Exam: There were no vitals filed for this visit. General.  Well-developed lady, in no acute distress. Pulmonary.  Lungs clear bilaterally, normal respiratory effort. CV.  Regular rate and rhythm, no JVD, rub or murmur. Abdomen.  Soft, nontender, nondistended, BS  positive. CNS.  Alert and oriented .  No focal neurologic deficit. Extremities.  No edema, no cyanosis, pulses intact and symmetrical. Psychiatry.  Judgment and insight appears normal.   Condition at discharge: stable  The results of significant diagnostics from this hospitalization (including imaging, microbiology, ancillary and laboratory) are listed below for reference.   Imaging Studies: DG Abd Portable 1V-Small Bowel Obstruction Protocol-initial, 8 hr delay  Result Date: 12/28/2022 CLINICAL DATA:  Small-bowel obstruction 8 hour delay EXAM: PORTABLE ABDOMEN - 1 VIEW COMPARISON:  12/28/2022, CT 12/27/2022 FINDINGS: Enteric tube tip at the proximal stomach. Similar nonspecific air distension of bowel. Radiopaque contrast within the colon and rectum. IMPRESSION: Similar nonspecific air distension of bowel. Contrast material has reached the colon and rectum Electronically Signed   By: Jasmine Pang M.D.   On: 12/28/2022 23:29   DG Abd Portable 1V-Small Bowel Obstruction Protocol-initial, 8 hr delay  Result Date: 12/28/2022 CLINICAL DATA:  72 year old female undergoing evaluation  for possible small bowel obstruction. Post 8 hour oral contrast administration. EXAM: PORTABLE ABDOMEN - 1 VIEW COMPARISON:  12/27/2022 radiographs and CT Abdomen and Pelvis. FINDINGS: AP view at 0516 hours. Enteric tube is looped in the proximal stomach. Oral contrast has mostly emptied from the stomach, but is difficult to identify in the abdomen or pelvis now. Unchanged nonspecific bowel gas pattern when compared to the scout view of the CT yesterday. There is some ascending and descending, rectal gas present. Stable cholecystectomy clips. Over penetrated lung bases. Stable visualized osseous structures. IMPRESSION: Stable enteric tube looped in the stomach. Oral contrast mostly exited the stomach, but now difficult to identify in the abdomen or pelvis. Unchanged nonspecific bowel gas pattern from the scout view of the CT  yesterday. Electronically Signed   By: Odessa Fleming M.D.   On: 12/28/2022 06:18   DG Abd Portable 1V-Small Bowel Protocol-Position Verification  Result Date: 12/27/2022 CLINICAL DATA:  NG tube placement. EXAM: PORTABLE ABDOMEN - 1 VIEW COMPARISON:  12/27/2022. FINDINGS: The bowel gas pattern is normal. An enteric tube terminates in the stomach and contrast is present at the gastric fundus. Surgical clips are noted in the right upper quadrant. There is atherosclerotic calcification of the aorta. No radio-opaque calculi or other acute radiographic abnormality are seen. IMPRESSION: Enteric tube terminates in the stomach and contrast is present at the gastric fundus. Electronically Signed   By: Thornell Sartorius M.D.   On: 12/27/2022 22:52   CT ABDOMEN PELVIS WO CONTRAST  Result Date: 12/27/2022 CLINICAL DATA:  Acute onset left abdominal pain EXAM: CT ABDOMEN AND PELVIS WITHOUT CONTRAST TECHNIQUE: Multidetector CT imaging of the abdomen and pelvis was performed following the standard protocol without IV contrast. RADIATION DOSE REDUCTION: This exam was performed according to the departmental dose-optimization program which includes automated exposure control, adjustment of the mA and/or kV according to patient size and/or use of iterative reconstruction technique. COMPARISON:  CT chest dated 08/08/2022, CT abdomen and pelvis dated 04/24/2018 FINDINGS: Lower chest: Bibasilar subsegmental atelectasis. Spiculated right lower lobe nodule is decreased in size, measuring 10 x 8 mm (5:10), previously 15 x 10 mm. Additional right lower lobe tree-in-bud nodules. Right lower lobe subsegmental mucous plugging. No pleural effusion or pneumothorax demonstrated. Partially imaged heart size is normal. Hepatobiliary: No focal hepatic lesions. No intra or extrahepatic biliary ductal dilation. Cholecystectomy. Pancreas: No focal lesions or main ductal dilation. Spleen: Normal in size without focal abnormality. Adrenals/Urinary Tract:  Unchanged 2.4 cm left adrenal nodule measures 0 HU in keeping with adenoma. No specific follow-up imaging recommended. No right adrenal nodule. No suspicious renal mass, calculi or hydronephrosis. No focal bladder wall thickening. Bladder contains a single focus of intraluminal gas. Stomach/Bowel: Mural thickening of the gastric antrum. Ileocolic anastomosis. Diffuse small bowel dilation the small bowel immediately proximal to the ileocolonic anastomosis appears decompressed. A few loops in the right lower quadrant demonstrate fecalization of intraluminal contents. Colonic diverticulosis without acute diverticulitis. Vascular/Lymphatic: Aortic atherosclerosis. No enlarged abdominal or pelvic lymph nodes. Reproductive: No adnexal masses. Other: Small volume free fluid.  No fluid collection or free air. Musculoskeletal: No acute or abnormal lytic or blastic osseous lesions. Multilevel degenerative changes of the partially imaged thoracic and lumbar spine. Bilateral gluteal subcutaneous granulomas. IMPRESSION: 1. Diffuse small bowel dilation with fecalization of intraluminal contents in the right lower quadrant. While a transition point is not confidently identified on this noncontrast-enhanced examination, given history of prior bowel obstruction and surgery, findings are again suspicious for small-bowel obstruction.  2. Mural thickening of the gastric antrum, which may be seen in the setting of gastritis. 3. Spiculated right lower lobe nodule is decreased in size, measuring 10 x 8 mm, previously 15 x 10 mm. Additional right lower lobe tree-in-bud nodules and subsegmental mucous plugging, likely infectious/inflammatory. 4. Colonic diverticulosis without acute diverticulitis. 5. Single focus of intraluminal gas in the bladder. Recommend correlation with recent instrumentation. 6.  Aortic Atherosclerosis (ICD10-I70.0). Electronically Signed   By: Agustin Cree M.D.   On: 12/27/2022 18:54   DG INJECT DIAG/THERA/INC  NEEDLE/CATH/PLC EPI/CERV/THOR W/IMG  Result Date: 12/21/2022 CLINICAL DATA:  Cervical radiculopathy. Displacement of the cervical disc at C6-7. FLUOROSCOPY: Radiation Exposure Index (as provided by the fluoroscopic device): Dose area product 3.55 uGy*m2 PROCEDURE: CERVICAL EPIDURAL INJECTION An interlaminar approach was performed on the left at C6-7. A 20 gauge epidural needle was advanced using loss-of-resistance technique. DIAGNOSTIC EPIDURAL INJECTION Injection of Isovue-M 300 shows a good epidural pattern with spread above and below the level of needle placement, primarily on the left. No vascular opacification is seen. THERAPEUTIC EPIDURAL INJECTION 1.5 ml of Kenalog 40 mixed with 1 ml of 1% Lidocaine and 2 ml of normal saline were then instilled. The procedure was well-tolerated, and the patient was discharged thirty minutes following the injection in good condition. IMPRESSION: Technically successful first epidural injection on the left at C6-7. Electronically Signed   By: Marin Roberts M.D.   On: 12/21/2022 09:18   [place] LIMITED JOINT SPACE STRUCTURES UP RIGHT(NO LINKED CHARGES)  Result Date: 12/15/2022 Procedure: Real-time Ultrasound Guided Injection of right middle flexor tendon sheath Device: GE Logiq Q7 Ultrasound guided injection is preferred based studies that show increased duration, increased effect, greater accuracy, decreased procedural pain, increased response rate, and decreased cost with ultrasound guided versus blind injection. Verbal informed consent obtained. Time-out conducted. Noted no overlying erythema, induration, or other signs of local infection. Skin prepped in a sterile fashion. Local anesthesia: Topical Ethyl chloride. With sterile technique and under real time ultrasound guidance: With a 25-gauge half inch needle injecting 0.5 cc of 0.5% Marcaine and 0.5 cc of Kenalog 40 mg/mL Completed without difficulty Pain immediately resolved suggesting accurate placement of the  medication. Advised to call if fevers/chills, erythema, induration, drainage, or persistent bleeding. Impression: Technically successful ultrasound guided injection.   DG Epidural/Nerve Root  Result Date: 12/07/2022 CLINICAL DATA:  Lumbar radiculopathy. Displacement of the L4-5 lumbar disc. FLUOROSCOPY: Radiation Exposure Index (as provided by the fluoroscopic device): Dose area product 20.54 uGy*m2 PROCEDURE: SELECTIVE NERVE ROOT AND TRANSFORAMINAL EPIDURAL STEROID INJECTION: A transforaminal approach was performed on the left at the L4-5 foramen. The overlying skin was cleansed and anesthetized. A 22 gauge spinal needle was advanced into the foramen from a lateral oblique approach. Injection of 2cc of Isovue-M 200 outlined the nerve root and extended into the epidural space. No vascular or intrathecal spread is evident. I then injected 40 mg of Depo-Medrol and 1.5 ml of 1% lidocaine. The patient tolerated the procedure without evidence for complication. A transforaminal approach was performed on the right at the L4-5 foramen. The overlying skin was cleansed and anesthetized. A 22 gauge spinal needle was advanced into the foramen from a lateral oblique approach. Injection of 2cc of Isovue-M 200 outlined the nerve root and extended into the epidural space. No vascular or intrathecal spread is evident. I then injected 40 mg of Depo-Medrol and 1.5 ml of 1% lidocaine. The patient tolerated the procedure without evidence for complication. The patient was observed  for 20 minutes prior to discharge in stable neurologic condition. IMPRESSION: Technically successful selective nerve root block and transforaminal epidural steroid injections bilaterally at the L4-5 foramina. Electronically Signed   By: Marin Roberts M.D.   On: 12/07/2022 12:30   DG Epidural/Nerve Root  Result Date: 12/07/2022 CLINICAL DATA:  Lumbar radiculopathy. Displacement of the L4-5 lumbar disc. FLUOROSCOPY: Radiation Exposure Index (as  provided by the fluoroscopic device): Dose area product 20.54 uGy*m2 PROCEDURE: SELECTIVE NERVE ROOT AND TRANSFORAMINAL EPIDURAL STEROID INJECTION: A transforaminal approach was performed on the left at the L4-5 foramen. The overlying skin was cleansed and anesthetized. A 22 gauge spinal needle was advanced into the foramen from a lateral oblique approach. Injection of 2cc of Isovue-M 200 outlined the nerve root and extended into the epidural space. No vascular or intrathecal spread is evident. I then injected 40 mg of Depo-Medrol and 1.5 ml of 1% lidocaine. The patient tolerated the procedure without evidence for complication. A transforaminal approach was performed on the right at the L4-5 foramen. The overlying skin was cleansed and anesthetized. A 22 gauge spinal needle was advanced into the foramen from a lateral oblique approach. Injection of 2cc of Isovue-M 200 outlined the nerve root and extended into the epidural space. No vascular or intrathecal spread is evident. I then injected 40 mg of Depo-Medrol and 1.5 ml of 1% lidocaine. The patient tolerated the procedure without evidence for complication. The patient was observed for 20 minutes prior to discharge in stable neurologic condition. IMPRESSION: Technically successful selective nerve root block and transforaminal epidural steroid injections bilaterally at the L4-5 foramina. Electronically Signed   By: Marin Roberts M.D.   On: 12/07/2022 12:30    Microbiology: Results for orders placed or performed during the hospital encounter of 12/27/22  Culture, blood (Routine X 2) w Reflex to ID Panel     Status: None (Preliminary result)   Collection Time: 12/27/22  8:43 PM   Specimen: BLOOD LEFT ARM  Result Value Ref Range Status   Specimen Description   Final    BLOOD LEFT ARM Performed at [facility], [address] [place], [place] [address]    Special Requests   Final    BOTTLES DRAWN AEROBIC AND ANAEROBIC Blood Culture adequate  volume Performed at [facility], [address], [place], [place] [address]    Culture   Final    NO GROWTH 2 DAYS Performed at [facility], [address] [place], [place] [address]    Report Status PENDING  Incomplete  Respiratory (~20 pathogens) panel by PCR     Status: None   Collection Time: 12/27/22  9:33 PM   Specimen: Nasopharyngeal Swab; Respiratory  Result Value Ref Range Status   Adenovirus NOT DETECTED NOT DETECTED Final   Coronavirus 229E NOT DETECTED NOT DETECTED Final    Comment: (NOTE) The Coronavirus on the Respiratory Panel, DOES NOT test for the novel  Coronavirus (2019 nCoV)    Coronavirus HKU1 NOT DETECTED NOT DETECTED Final   Coronavirus NL63 NOT DETECTED NOT DETECTED Final   Coronavirus OC43 NOT DETECTED NOT DETECTED Final   Metapneumovirus NOT DETECTED NOT DETECTED Final   Rhinovirus / Enterovirus NOT DETECTED NOT DETECTED Final   Influenza A NOT DETECTED NOT DETECTED Final   Influenza B NOT DETECTED NOT DETECTED Final   Parainfluenza Virus 1 NOT DETECTED NOT DETECTED Final   Parainfluenza Virus 2 NOT DETECTED NOT DETECTED Final   Parainfluenza Virus 3 NOT  DETECTED NOT DETECTED Final   Parainfluenza Virus 4 NOT DETECTED NOT DETECTED Final   Respiratory Syncytial Virus NOT DETECTED NOT DETECTED Final   Bordetella pertussis NOT DETECTED NOT DETECTED Final   Bordetella Parapertussis NOT DETECTED NOT DETECTED Final   Chlamydophila pneumoniae NOT DETECTED NOT DETECTED Final   Mycoplasma pneumoniae NOT DETECTED NOT DETECTED Final    Comment: Performed at [facility], [address], [place], [place] [address]  Culture, blood (Routine X 2) w Reflex to ID Panel     Status: None (Preliminary result)   Collection Time: 12/28/22 12:41 AM   Specimen: BLOOD LEFT ARM  Result Value Ref Range Status   Specimen Description   Final    BLOOD LEFT ARM Performed at [facility], [address], [place], [place] [address]     Special Requests   Final    BOTTLES DRAWN AEROBIC AND ANAEROBIC Blood Culture adequate volume Performed at [facility], [address], [place], [place] [address]    Culture   Final    NO GROWTH 1 DAY Performed at [facility], [address], [place], [place] [address]    Report Status PENDING  Incomplete    Labs: CBC: Recent Labs  Lab 12/25/22 1049 12/27/22 1627 12/28/22 0041 12/29/22 0406 12/29/22 0951  WBC 7.7 13.4* 8.2 7.8 11.0*  NEUTROABS 5.2 10.1*  --   --   --   HGB 13.2 14.5 15.8* 10.8* 12.4  HCT 40.8 45.9 50.7* 34.9* 41.0  MCV 95.3 99.4 98.1 99.4 101.2*  PLT 241.0 245 235 177 234   Basic Metabolic Panel: Recent Labs  Lab 12/25/22 1049 12/27/22 1627 12/28/22 0041 12/29/22 0406  NA 143 139 137 143  K 4.9 4.1 4.4 4.4  CL 106 107 106 108  CO2 28 24 21* 26  GLUCOSE 95 97 133* 94  BUN 18 16 18  24*  CREATININE 0.65 0.56 0.51 0.61  CALCIUM 9.9 8.3* 8.9 8.7*   Liver Function Tests: Recent Labs  Lab 12/25/22 1049 12/27/22 1627 12/28/22 0041 12/29/22 0406  AST 14 22 23 18   ALT 14 19 24 15   ALKPHOS 55 49 63 43  BILITOT 0.4 0.8 0.7 0.7  PROT 6.5 6.3* 6.6 5.2*  ALBUMIN 4.3 3.5 3.6 2.9*   CBG: No results for input(s): "GLUCAP" in the last 168 hours.  Discharge time spent: greater than 30 minutes.  This record has been created using Conservation officer, historic buildings. Errors have been sought and corrected,but may not always be located. Such creation errors do not reflect on the standard of care.   Signed: Arnetha Courser, MD [facility] 12/29/2022

## 2022-12-29 NOTE — Telephone Encounter (Signed)
Mrs Erica [name] called and reported she is in the hospital and they have been giving her narcotic medication. She was concerned about violating her contract agreement with our practice. I told her it is ok for her to take this medication while hospitalized, and if they give her a week supply when she leaves then she will not take anything from our provider until that complete and then she would resume treatment with Dr Shearon Stalls. I will let Dr Shearon Stalls know so she will be aware. Mrs Erica [name] was stressed with worry but I informed her she did the right thing by letting us know.

## 2022-12-29 NOTE — Progress Notes (Addendum)
Daily Progress Note  DOA: 12/27/2022 Hospital Day: 3  Chief Complaint: Crohn's and SBO  ASSESSMENT    Brief Narrative:  Erica [name] is a 72 y.o. year old female with a history of  COPD, pulmonary nodule ( under surveillance), Crohn's ileitis , stage I colon cancer in 2010 s/p R  hemicolectomy, grade 1 diastolic heart failure preserved EF 60 to 65%   Crohn's ileitis. She has done pretty well despite electing not to take treatment. Only mild ileitis on latest colonoscopy in July 2024  Recurrent SBO, likely 2/2 to adhesions. Resolving. Abd xray yesterday showed contrast in colon and rectum.  NGT removed this am. Just had 2nd cup of coffee. Abdominal pain improved, getting back to baseline ( has chronic abdominal pain ). No N/V, + flatus and having BMs.   Remote history of colon cancer ion 2010, s/p R hemicolectomy  RLL Pneumonia.  Getting ceftriaxone and doxycycline  RLL pulmonary nodule ( under surveillance)   PLAN   Continue supportive care. NGT just removed this am and so far tolerating clears.     [facility] Attending   I have taken an interval history, reviewed the chart and examined the patient. I agree with the Advanced Practitioner's note, impression and recommendations with the following additions:  She has opened up and is moving her bowels and ready for discharge.  We have no new recommendations.  Iva Boop, MD, [facility] See Loretha Stapler on call - gastroenterology for best contact person 12/29/2022 4:13 PM  Subjective   +flatus + BMs No significant abdominal pain NGT removed this am and tolerating coffee   Objective   Most recent colonoscopy  July 2024 - Hemorrhoids found on perianal exam. - Two diminutive polyps in the rectum, removed with a cold snare. Resected and retrieved. - Diverticulosis in the sigmoid colon. - Patent end-to-side ileo-colonic anastomosis. - Crohn's disease with ileitis. Inflammation was found. This was  mild in severity. - The examination was otherwise normal on direct and retroflexion views.  Recent Labs    12/27/22 1627 12/28/22 0041 12/29/22 0406  WBC 13.4* 8.2 7.8  HGB 14.5 15.8* 10.8*  HCT 45.9 50.7* 34.9*  PLT 245 235 177   BMET Recent Labs    12/27/22 1627 12/28/22 0041 12/29/22 0406  NA 139 137 143  K 4.1 4.4 4.4  CL 107 106 108  CO2 24 21* 26  GLUCOSE 97 133* 94  BUN 16 18 24*  CREATININE 0.56 0.51 0.61  CALCIUM 8.3* 8.9 8.7*   LFT Recent Labs    12/29/22 0406  PROT 5.2*  ALBUMIN 2.9*  AST 18  ALT 15  ALKPHOS 43  BILITOT 0.7   PT/INR No results for input(s): "LABPROT", "INR" in the last 72 hours.   Imaging:  DG Abd Portable 1V-Small Bowel Obstruction Protocol-initial, 8 hr delay CLINICAL DATA:  Small-bowel obstruction 8 hour delay  EXAM: PORTABLE ABDOMEN - 1 VIEW  COMPARISON:  12/28/2022, CT 12/27/2022  FINDINGS: Enteric tube tip at the proximal stomach. Similar nonspecific air distension of bowel. Radiopaque contrast within the colon and rectum.  IMPRESSION: Similar nonspecific air distension of bowel. Contrast material has reached the colon and rectum  Electronically Signed   By: Jasmine Pang M.D.   On: 12/28/2022 23:29 DG Abd Portable 1V-Small Bowel Obstruction Protocol-initial, 8 hr delay CLINICAL DATA:  72 year old female undergoing evaluation for possible small bowel obstruction. Post 8 hour oral contrast administration.  EXAM: PORTABLE ABDOMEN - 1 VIEW  COMPARISON:  12/27/2022 radiographs and CT Abdomen and Pelvis.  FINDINGS: AP view at 0516 hours. Enteric tube is looped in the proximal stomach. Oral contrast has mostly emptied from the stomach, but is difficult to identify in the abdomen or pelvis now. Unchanged nonspecific bowel gas pattern when compared to the scout view of the CT yesterday. There is some ascending and descending, rectal gas present. Stable cholecystectomy clips. Over penetrated lung bases. Stable  visualized osseous structures.  IMPRESSION: Stable enteric tube looped in the stomach. Oral contrast mostly exited the stomach, but now difficult to identify in the abdomen or pelvis. Unchanged nonspecific bowel gas pattern from the scout view of the CT yesterday.  Electronically Signed   By: Odessa Fleming M.D.   On: 12/28/2022 06:18     Scheduled inpatient medications:   guaiFENesin  15 mL Per Tube Q6H   pantoprazole (PROTONIX) IV  40 mg Intravenous Q24H   sodium chloride flush  3 mL Intravenous Q12H   Continuous inpatient infusions:   sodium chloride     cefTRIAXone (ROCEPHIN)  IV Stopped (12/28/22 2252)   doxycycline (VIBRAMYCIN) IV Stopped (12/28/22 2145)   PRN inpatient medications: sodium chloride, acetaminophen **OR** acetaminophen, hydrALAZINE, HYDROmorphone (DILAUDID) injection, ipratropium-albuterol, menthol-cetylpyridinium, ondansetron (ZOFRAN) IV, phenol, sodium chloride flush  Vital signs in last 24 hours: Temp:  [98.1 F (36.7 C)-98.2 F (36.8 C)] 98.1 F (36.7 C) (09/27 0547) Pulse Rate:  [80-103] 80 (09/27 0547) Resp:  [14-18] 18 (09/27 0547) BP: (130-138)/(76-89) 138/76 (09/27 0547) SpO2:  [80 %-97 %] 92 % (09/27 0547) Last BM Date : 12/29/22  Intake/Output Summary (Last 24 hours) at 12/29/2022 0847 Last data filed at 12/29/2022 0327 Gross per 24 hour  Intake 1682.05 ml  Output 100 ml  Net 1582.05 ml    Intake/Output from previous day: 09/26 0701 - 09/27 0700 In: 1682.1 [I.V.:1082; IV Piggyback:600] Out: 100 [Emesis/NG output:100] Intake/Output this shift: No intake/output data recorded.   Physical Exam:  General: Alert female in NAD Heart:  Regular rate and rhythm.  Pulmonary: Normal respiratory effort Abdomen: Soft, mildly distended with a few bowel sounds. Mild generalized tenderness. Normal bowel sounds. Extremities: No lower extremity edema  Neurologic: Alert and oriented Psych: Pleasant. Cooperative. Insight appears normal.    Principal  Problem:   SBO (small bowel obstruction) (HCC) Active Problems:   Hyperlipidemia   History of colon cancer   History of Crohn's disease   Right lower lobe pneumonia   Vitamin D deficiency   COPD (chronic obstructive pulmonary disease) (HCC)   Lung nodule   Chronic pain syndrome   Chronic diastolic CHF (congestive heart failure) (HCC)   Acute hypoxic respiratory failure (HCC)   History of CAD (coronary artery disease)   GAD (generalized anxiety disorder)   Acute gastritis without bleeding     LOS: 2 days   Willette Cluster ,NP 12/29/2022, 8:47 AM

## 2022-12-29 NOTE — Progress Notes (Signed)
Mobility Specialist - Progress Note   12/29/22 0951  Mobility  Activity Ambulated with assistance in hallway  Level of Assistance Modified independent, requires aide device or extra time  Assistive Device Other (Comment) (IV Pole)  Distance Ambulated (ft) 500 ft  Activity Response Tolerated well  Mobility Referral Yes  $Mobility charge 1 Mobility  Mobility Specialist Start Time (ACUTE ONLY) [id number]  Mobility Specialist Stop Time (ACUTE ONLY) 0949  Mobility Specialist Time Calculation (min) (ACUTE ONLY) 13 min   Pt received in bed and agreeable to mobility. No complaints during session. Pt to EOB after session with all needs met.     [facility]

## 2022-12-29 NOTE — Care Management Important Message (Signed)
Important Message  Patient Details  Name: Erica [name] MRN: [id number] Date of Birth: 03-29-1951   Important Message Given:        Mardene Sayer 12/29/2022, 12:01 PM

## 2022-12-29 NOTE — Plan of Care (Signed)
  Problem: Activity: Goal: Risk for activity intolerance will decrease Outcome: Progressing   Problem: Nutrition: Goal: Adequate nutrition will be maintained Outcome: Progressing   Problem: Safety: Goal: Ability to remain free from injury will improve Outcome: Progressing   Problem: Pain Managment: Goal: General experience of comfort will improve Outcome: Progressing

## 2023-01-01 ENCOUNTER — Other Ambulatory Visit: Payer: Self-pay | Admitting: Nurse Practitioner

## 2023-01-01 ENCOUNTER — Telehealth: Payer: Self-pay | Admitting: Nurse Practitioner

## 2023-01-01 ENCOUNTER — Telehealth: Payer: Self-pay | Admitting: Emergency Medicine

## 2023-01-01 LAB — CULTURE, BLOOD (ROUTINE X 2): Culture: NO GROWTH

## 2023-01-01 NOTE — Telephone Encounter (Signed)
I have scheduled pt a hosp f/up by mychart for 01/04/23 with Lauren. She was admitted into [facility].

## 2023-01-01 NOTE — Telephone Encounter (Signed)
I called and spoke with patient and notified her that Lauren would like to see her in the office or have a virtual visit.   Gina scheduled patient for a video visit at 120pm on 01-04-23

## 2023-01-01 NOTE — Transitions of Care (Post Inpatient/ED Visit) (Signed)
01/01/2023  Name: Erica [name] MRN: [id number] DOB: May 22, 1950  Today's TOC FU Call Status: Today's TOC FU Call Status:: Successful TOC FU Call Completed TOC FU Call Complete Date: 01/01/23 Patient's Name and Date of Birth confirmed.  Transition Care Management Follow-up Telephone Call Date of Discharge: 12/29/22 Discharge Facility: [facility]) Type of Discharge: Inpatient Admission Primary Inpatient Discharge Diagnosis:: Small bowel obstruction How have you been since you were released from the hospital?: Better Any questions or concerns?: Yes  Items Reviewed: Did you receive and understand the discharge instructions provided?: Yes Any new allergies since your discharge?: No Do you have support at home?: Yes People in Home: alone, child(ren), adult  Medications Reviewed Today: Medications Reviewed Today     Reviewed by Larey Dresser, RN (Registered Nurse) on 01/01/23 at 1622  Med List Status: <None>   Medication Order Taking? Sig Documenting Provider Last Dose Status Informant  albuterol (PROVENTIL) (2.5 MG/3ML) 0.083% nebulizer solution [id number] No Take 3 mLs (2.5 mg total) by nebulization every 6 (six) hours as needed for wheezing or shortness of breath. McElwee, [name], NP unk Active Self  albuterol (VENTOLIN HFA) 108 (90 Base) MCG/ACT inhaler [id number] No INHALE 2 PUFFS BY MOUTH EVERY 6 HOURS AS NEEDED FOR WHEEZING FOR SHORTNESS OF BREATH  Patient taking differently: Inhale 2 puffs into the lungs every 4 (four) hours as needed for wheezing or shortness of breath.   McElwee, [name], NP 12/27/2022 Active Self  amoxicillin-clavulanate (AUGMENTIN) 875-125 MG tablet [id number]  Take 1 tablet by mouth 2 (two) times daily for 3 days. Arnetha Courser, MD  Active   aspirin EC 81 MG tablet [id number] No Take 81 mg by mouth at bedtime. [provider] 12/26/2022 2000 Active Self  cyclobenzaprine (FLEXERIL) 10 MG tablet [id number] No Take 1 tablet by mouth three  times daily as needed for muscle spasm McElwee, [name], NP unk Active Self  doxycycline (VIBRA-TABS) 100 MG tablet [id number]  Take 1 tablet (100 mg total) by mouth 2 (two) times daily for 3 days. Arnetha Courser, MD  Active   esomeprazole (NEXIUM) 20 MG capsule [id number]  Take 1 capsule (20 mg total) by mouth daily. Arnetha Courser, MD  Active   fluticasone [facility]) 50 MCG/ACT nasal spray [id number] No Place 2 sprays into both nostrils daily.  Patient taking differently: Place 2 sprays into both nostrils daily as needed for allergies or rhinitis.   McElwee, [name], NP unk Active Self  HYDROcodone-acetaminophen (NORCO) 5-325 MG tablet [id number] No Take 1 tablet by mouth 2 (two) times daily as needed for severe pain.  Patient taking differently: Take 1 tablet by mouth in the morning and at bedtime.   Angelina Sheriff, DO 12/27/2022 am Active Self  hydrOXYzine (ATARAX) 10 MG tablet [id number] No Take 1 tablet (10 mg total) by mouth 3 (three) times daily as needed for anxiety. McElwee, [name], NP unk Active Self  metoprolol tartrate (LOPRESSOR) 25 MG tablet [id number] No TAKE 1 TABLET BY MOUTH TWICE DAILY --PLEASE  CALL  OUR  OFFICE  TO  SCHEDULE  AN  APPT  FOR  FUTURE  REFILLS  --2ND  ATTEMPT  Patient taking differently: Take 25 mg by mouth 2 (two) times daily.   Tereso Newcomer T, PA-C 12/27/2022 1000 Active Self  rosuvastatin (CRESTOR) 10 MG tablet [id number] No Take 1 tablet (10 mg total) by mouth 2 (two) times a week. Patient takes 10mg  on Mondays and 10mg  on Fridays.  Patient taking differently: Take  10 mg by mouth See admin instructions. Take 10 mg by mouth once a day on Mondays and Wednesdays only   McElwee, [name], NP 12/27/2022 Active Self  Vitamin D, Ergocalciferol, (DRISDOL) 1.25 MG (50000 UNIT) CAPS capsule [id number] No Take 1 capsule by mouth once a week  Patient taking differently: Take 50,000 Units by mouth every Saturday.   Gerre Scull, NP 12/23/2022 Active Self             Home Care and Equipment/Supplies: Were Home Health Services Ordered?: NA Any new equipment or medical supplies ordered?: NA  Functional Questionnaire: Do you need assistance with bathing/showering or dressing?: No Do you need assistance with meal preparation?: No Do you need assistance with eating?: No Do you have difficulty maintaining continence: No Do you need assistance with getting out of bed/getting out of a chair/moving?: No Do you have difficulty managing or taking your medications?: No  Follow up appointments reviewed: PCP Follow-up appointment confirmed?: Yes Date of PCP follow-up appointment?: 01/04/23 Follow-up Provider: [facility] Follow-up appointment confirmed?: NA Do you need transportation to your follow-up appointment?: No Do you understand care options if your condition(s) worsen?: Yes-patient verbalized understanding    SIGNATURE Arvil Persons, BSN, RN

## 2023-01-01 NOTE — Telephone Encounter (Signed)
01/01/23 - Pt called wanting to speak with her pcp,she said she was admitted on wed, 12/27/22 at [facility]  for abdominal obstruction and was dicharged on Fri, 12/29/22. Scan was carried out on her and she was told she has pneumonia, she has taken all the antibioctics prescribed to her but she still doesn't feel any better. I offered to sch her for H/F but she declined saying she can't drive because she has pain in her stomach. She said her tummy is still sensitive. She wants a call back from nurse at [phone]

## 2023-01-01 NOTE — Telephone Encounter (Signed)
Pt calling in bc she states she get a CT Scan but she's been in the hospital. Pls advise

## 2023-01-02 LAB — CULTURE, BLOOD (ROUTINE X 2)
Culture: NO GROWTH
Special Requests: ADEQUATE

## 2023-01-03 DIAGNOSIS — F99 Mental disorder, not otherwise specified: Secondary | ICD-10-CM | POA: Diagnosis not present

## 2023-01-04 ENCOUNTER — Encounter: Payer: Medicare Other | Admitting: Nurse Practitioner

## 2023-01-04 ENCOUNTER — Encounter: Payer: Self-pay | Admitting: Nurse Practitioner

## 2023-01-04 DIAGNOSIS — I2510 Atherosclerotic heart disease of native coronary artery without angina pectoris: Secondary | ICD-10-CM | POA: Insufficient documentation

## 2023-01-04 MED ORDER — AMOXICILLIN-POT CLAVULANATE 875-125 MG PO TABS: 1.0000 | ORAL_TABLET | Freq: Two times a day (BID) | ORAL | 0 refills | Status: DC

## 2023-01-04 MED ORDER — ALBUTEROL SULFATE HFA 108 (90 BASE) MCG/ACT IN AERS: 1.0000 | INHALATION_SPRAY | Freq: Four times a day (QID) | RESPIRATORY_TRACT | 0 refills | Status: DC | PRN

## 2023-01-04 MED ORDER — AZITHROMYCIN 250 MG PO TABS: ORAL_TABLET | ORAL | 0 refills | Status: DC

## 2023-01-04 NOTE — Progress Notes (Signed)
Cardiology Office Note:    Date:  01/05/2023  ID:  Erica [name], DOB 07/11/1950, MRN [id number] PCP: Gerre Scull, NP  [facility] Providers Cardiologist:  None       Patient Profile:      Coronary artery disease  CAC on CT in 05/2018 Myoview 2017: no ischemia  Hx of Tako-Tsubo CM in 2010 Cath w normal cors at that time  EF returned to normal  TTE 10/17/13: EF 60-65, Gr 1 DD  (HFpEF) heart failure with preserved ejection fraction  Tobacco use Hyperlipidemia Hypertension  Colon CA COPD Crohn's Disease GERD         History of Present Illness:  Discussed the use of AI scribe software for clinical note transcription with the patient, who gave verbal consent to proceed.  Erica [name] is a 72 y.o. female who returns for follow up of CAD, CHF. She was last seen by Dr. Katrinka Blazing in 11/2021. She was recently admitted for small bowel obstruction treated conservatively.   A 72 year old patient with a history of coronary artery calcium, stress-induced cardiomyopathy, heart failure with preserved ejection fraction, hypertension, hyperlipidemia, and COPD presents for a routine follow-up. The patient was recently hospitalized for a small bowel obstruction, which was managed conservatively. The patient reports that the swelling in her feet, particularly the left foot, has been a concern for the past three to four days. The swelling has been so severe that the patient has had difficulty wearing regular shoes and has resorted to wearing Crocs. The patient denies any sudden weight gain or coughing but admits to occasional shortness of breath, especially when climbing the nineteen stairs to her second-floor apartment. The patient uses an inhaler as needed for the shortness of breath. The patient continues to smoke despite her COPD and has not considered quitting.      Review of Systems  Respiratory:  Positive for cough.   Gastrointestinal:  Negative for hematochezia and melena.   Genitourinary:  Negative for hematuria.  See HPI     Studies Reviewed:       LABS LDL: 84 (12/25/2022) HDL: 24.4 (12/25/2022) Total cholesterol: 177 (12/25/2022) Triglycerides: 136 (12/25/2022) Potassium: 4.4 (12/29/2022) Creatinine: 0.61 (12/29/2022) ALT: 15 (12/29/2022) Hemoglobin: 12.4 (12/29/2022) TSH: 0.35 (12/25/2022)   Risk Assessment/Calculations:             Physical Exam:   VS:  BP 126/78   Pulse 75   Ht 5\' 1"  (1.549 m)   Wt 122 lb 12.8 oz (55.7 kg)   LMP  (LMP Unknown)   SpO2 98%   BMI 23.20 kg/m    Wt Readings from Last 3 Encounters:  01/05/23 122 lb 12.8 oz (55.7 kg)  01/04/23 121 lb 9.6 oz (55.2 kg)  12/25/22 120 lb 6.4 oz (54.6 kg)    Constitutional:      Appearance: Healthy appearance. Not in distress.  Neck:     Vascular: No JVR. JVD normal.  Pulmonary:     Breath sounds: Normal breath sounds. No wheezing. No rales.  Cardiovascular:     Normal rate. Regular rhythm.     Murmurs: There is no murmur.  Edema:    Peripheral edema present.    Pretibial: trace edema of the left pretibial area.    Ankle: 1+ edema of the left ankle and trace edema of the right ankle.    Feet: trace edema of the left foot. Abdominal:     Palpations: Abdomen is soft.  Skin:    General: Skin is  warm and dry.        Assessment and Plan:   Assessment & Plan Chronic heart failure with preserved ejection fraction (HCC) Worsening shortness of breath, recent treatment for possible pneumonia. Last echo in 2015 with EF 65-69%.  She is NYHA II-IIb.  She has swelling in her left leg.  Lungs are clear. -Order follow-up echocardiogram to reassess LV function. -Order BNP. If elevated, consider daily diuretics. Coronary artery calcification seen on CT scan CAC by CT in 2020.  No chest pain suggestive of angina. -Continue Aspirin 81mg  daily, rosuvastatin. Essential hypertension Controlled. -Continue Metoprolol Tartrate 25mg  twice daily. Mixed hyperlipidemia Recent LDL  above goal (LDL 84, goal <70). -Increase Rosuvastatin to 10mg  three times a week. -Order follow-up CMET and lipids in six weeks. Tobacco abuse Patient not ready to quit. Leg edema, left Recent hospitalization for small bowel obstruction. Left leg edema greater than right, new onset. No recent weight gain, cough, or hemoptysis. -Order venous duplex to rule out DVT (cannot be done until Monday unless she goes to the emergency room; patient knows to go the ER if she develops pain in her leg or worsening swelling over the weekend) -Prescribe Lasix 20mg  daily as needed for swelling. If taken more than two days consecutively, patient to contact office for follow-up BMP to assess renal function and potassium.         Dispo:  Return in about 6 weeks (around 02/16/2023) for Follow up after testing, w/ Dr. Izora Ribas, or Tereso Newcomer, PA-C.  Signed, Tereso Newcomer, PA-C

## 2023-01-04 NOTE — Progress Notes (Signed)
Patient unable to connect to mychart video visit. She is still experiencing some shortness of breath and cough from the pneumonia. Will refill albuterol inhaler and re-start augmentin BID x 7 days and a zpak. She has appointment with cardiology tomorrow. Will reschedule hospital follow-up appointment to 01/08/23 at 11am.   Gerre Scull, NP

## 2023-01-04 NOTE — Telephone Encounter (Signed)
Per chart notes patient treated for pna while admitted. Nothing further needed at this time.

## 2023-01-05 ENCOUNTER — Encounter: Payer: Self-pay | Admitting: Physician Assistant

## 2023-01-05 ENCOUNTER — Ambulatory Visit: Payer: Medicare Other | Attending: Physician Assistant | Admitting: Physician Assistant

## 2023-01-05 VITALS — BP 126/78 | HR 75 | Ht 61.0 in | Wt 122.8 lb

## 2023-01-05 DIAGNOSIS — Z720 Tobacco use: Secondary | ICD-10-CM | POA: Diagnosis not present

## 2023-01-05 DIAGNOSIS — E782 Mixed hyperlipidemia: Secondary | ICD-10-CM | POA: Insufficient documentation

## 2023-01-05 DIAGNOSIS — I2510 Atherosclerotic heart disease of native coronary artery without angina pectoris: Secondary | ICD-10-CM | POA: Insufficient documentation

## 2023-01-05 DIAGNOSIS — I5032 Chronic diastolic (congestive) heart failure: Secondary | ICD-10-CM | POA: Diagnosis not present

## 2023-01-05 DIAGNOSIS — R609 Edema, unspecified: Secondary | ICD-10-CM | POA: Insufficient documentation

## 2023-01-05 DIAGNOSIS — R600 Localized edema: Secondary | ICD-10-CM | POA: Insufficient documentation

## 2023-01-05 DIAGNOSIS — I10 Essential (primary) hypertension: Secondary | ICD-10-CM | POA: Insufficient documentation

## 2023-01-05 MED ORDER — FUROSEMIDE 20 MG PO TABS: 20.0000 mg | ORAL_TABLET | Freq: Every day | ORAL | 1 refills | Status: DC | PRN

## 2023-01-05 MED ORDER — ROSUVASTATIN CALCIUM 10 MG PO TABS
10.0000 mg | ORAL_TABLET | ORAL | 3 refills | Status: DC
Start: 2023-01-05 — End: 2023-02-06

## 2023-01-05 NOTE — Assessment & Plan Note (Signed)
CAC by CT in 2020.  No chest pain suggestive of angina. -Continue Aspirin 81mg  daily, rosuvastatin.

## 2023-01-05 NOTE — Assessment & Plan Note (Signed)
Worsening shortness of breath, recent treatment for possible pneumonia. Last echo in 2015 with EF 65-69%.  She is NYHA II-IIb.  She has swelling in her left leg.  Lungs are clear. -Order follow-up echocardiogram to reassess LV function. -Order BNP. If elevated, consider daily diuretics.

## 2023-01-05 NOTE — Assessment & Plan Note (Signed)
Patient not ready to quit

## 2023-01-05 NOTE — Assessment & Plan Note (Signed)
Recent hospitalization for small bowel obstruction. Left leg edema greater than right, new onset. No recent weight gain, cough, or hemoptysis. -Order venous duplex to rule out DVT (cannot be done until Monday unless she goes to the emergency room; patient knows to go the ER if she develops pain in her leg or worsening swelling over the weekend) -Prescribe Lasix 20mg  daily as needed for swelling. If taken more than two days consecutively, patient to contact office for follow-up BMP to assess renal function and potassium.

## 2023-01-05 NOTE — Assessment & Plan Note (Signed)
Controlled. -Continue Metoprolol Tartrate 25mg  twice daily.

## 2023-01-05 NOTE — Patient Instructions (Addendum)
Medication Instructions:  Your physician has recommended you make the following change in your medication:   INCREASE the Rosuvastatin to Monday, Wednesday, & Friday's  START Lasix 20 mg taking daily only as needed.. if you have to take one 2 days in a row, please let me know   *If you need a refill on your cardiac medications before your next appointment, please call your pharmacy*   Lab Work: TODAY:  PRO BNP  6 WEEKS FASTING LIPID & CMET  If you have labs (blood work) drawn today and your tests are completely normal, you will receive your results only by: MyChart Message (if you have MyChart) OR A paper copy in the mail If you have any lab test that is abnormal or we need to change your treatment, we will call you to review the results.   Testing/Procedures: Your physician has requested that you have a lower or upper extremity venous duplex TODAY OR MONDAY.  This test is an ultrasound of the veins in the legs or arms. It looks at venous blood flow that carries blood from the heart to the legs or arms. Allow one hour for a Lower Venous exam. Allow thirty minutes for an Upper Venous exam. There are no restrictions or special instructions.  Your physician has requested that you have an echocardiogram. Echocardiography is a painless test that uses sound waves to create images of your heart. It provides your doctor with information about the size and shape of your heart and how well your heart's chambers and valves are working. This procedure takes approximately one hour. There are no restrictions for this procedure. Please do NOT wear cologne, perfume, aftershave, or lotions (deodorant is allowed). Please arrive 15 minutes prior to your appointment time.    Follow-Up: At [facility], you and your health needs are our priority.  As part of our continuing mission to provide you with exceptional heart care, we have created designated Provider Care Teams.  These Care Teams include  your primary Cardiologist (physician) and Advanced Practice Providers (APPs -  Physician Assistants and Nurse Practitioners) who all work together to provide you with the care you need, when you need it.  We recommend signing up for the patient portal called "MyChart".  Sign up information is provided on this After Visit Summary.  MyChart is used to connect with patients for Virtual Visits (Telemedicine).  Patients are able to view lab/test results, encounter notes, upcoming appointments, etc.  Non-urgent messages can be sent to your provider as well.   To learn more about what you can do with MyChart, go to [email or web address].    Your next appointment:   6 week(s)  Provider:   None  or Tereso Newcomer, PA-C         Other Instructions

## 2023-01-05 NOTE — Assessment & Plan Note (Signed)
Recent LDL above goal (LDL 84, goal <70). -Increase Rosuvastatin to 10mg  three times a week. -Order follow-up CMET and lipids in six weeks.

## 2023-01-06 LAB — PRO B NATRIURETIC PEPTIDE: NT-Pro BNP: 251 pg/mL (ref 0–301)

## 2023-01-07 ENCOUNTER — Other Ambulatory Visit: Payer: Self-pay | Admitting: Nurse Practitioner

## 2023-01-08 ENCOUNTER — Encounter: Payer: Self-pay | Admitting: Nurse Practitioner

## 2023-01-08 ENCOUNTER — Ambulatory Visit (INDEPENDENT_AMBULATORY_CARE_PROVIDER_SITE_OTHER): Payer: Medicare Other | Admitting: Nurse Practitioner

## 2023-01-08 VITALS — BP 128/78 | HR 81 | Temp 97.1°F | Ht 61.0 in | Wt 123.0 lb

## 2023-01-08 DIAGNOSIS — Z8719 Personal history of other diseases of the digestive system: Secondary | ICD-10-CM

## 2023-01-08 DIAGNOSIS — J439 Emphysema, unspecified: Secondary | ICD-10-CM

## 2023-01-08 DIAGNOSIS — J189 Pneumonia, unspecified organism: Secondary | ICD-10-CM | POA: Diagnosis not present

## 2023-01-08 DIAGNOSIS — I700 Atherosclerosis of aorta: Secondary | ICD-10-CM

## 2023-01-08 DIAGNOSIS — I5032 Chronic diastolic (congestive) heart failure: Secondary | ICD-10-CM | POA: Diagnosis not present

## 2023-01-08 MED ORDER — PREDNISONE 20 MG PO TABS: 40.0000 mg | ORAL_TABLET | Freq: Every day | ORAL | 0 refills | Status: DC

## 2023-01-08 NOTE — Assessment & Plan Note (Signed)
Noted on CT scan 12/27/22. Continue aspirin 81mg  daily and rosuvastatin Monday, Wednesday, Friday.

## 2023-01-08 NOTE — Assessment & Plan Note (Signed)
She was recently admitted to the hospital from 12/27/22 through 12/29/22. This was noted to be most likely from her history of Crohn's disease. She was treated with a NG tube and bowel rest. Her symptoms have improved. CT scan reviewed. Follow-up with any concerns.

## 2023-01-08 NOTE — Progress Notes (Signed)
Established Patient Office Visit  Subjective   Patient ID: Erica [name], female    DOB: 19-Aug-1950  Age: 72 y.o. MRN: [id number]  Chief Complaint  Patient presents with   Hospitalization Follow-up    Small bowel obstruction    HPI  Erica [name] is here to follow-up for TOC after small bowel obstruction and pneumonia.   She states that her stomach is doing better, she has been having bowel movements. She is however still having some shortness of breath and chest tightness. She denies fevers. She is still smoking and she is coughing. She has finished the azithromycin and still has a few days left of augmentin.   Transition of Care Hospital Follow up.   Hospital/Facility: [facility] D/C Physician: Arnetha Courser, MD D/C Date: 12/29/22  Records Requested: 01/08/23 Records Received: 01/08/23 Records Reviewed: 01/08/23  Diagnoses on Discharge: Small bowel obstruction, pneumonia due to infectious organism, acute hypoxic respiratory failure, history of Crohn's disease, acute gastritis without bleeding, chronic diasolic heart failure, lung nodule  Date of interactive Contact within 48 hours of discharge: 01/01/23 Contact was through: phone  Date of 7 day or 14 day face-to-face visit:    within 14 days  Outpatient Encounter Medications as of 01/08/2023  Medication Sig   albuterol (PROVENTIL) (2.5 MG/3ML) 0.083% nebulizer solution Take 3 mLs (2.5 mg total) by nebulization every 6 (six) hours as needed for wheezing or shortness of breath.   albuterol (VENTOLIN HFA) 108 (90 Base) MCG/ACT inhaler Inhale 1-2 puffs into the lungs every 6 (six) hours as needed for wheezing or shortness of breath.   amoxicillin-clavulanate (AUGMENTIN) 875-125 MG tablet Take 1 tablet by mouth 2 (two) times daily.   aspirin EC 81 MG tablet Take 81 mg by mouth at bedtime.   azithromycin (ZITHROMAX) 250 MG tablet Take 2 tablets today and then 1 tablet daily until gone   cyclobenzaprine (FLEXERIL) 10  MG tablet Take 1 tablet by mouth three times daily as needed for muscle spasm (Patient taking differently: Take 10 mg by mouth as needed. for muscle spams)   esomeprazole (NEXIUM) 20 MG capsule Take 1 capsule (20 mg total) by mouth daily.   fluticasone (FLONASE) 50 MCG/ACT nasal spray Place 2 sprays into both nostrils daily. (Patient taking differently: Place 2 sprays into both nostrils daily as needed for allergies or rhinitis.)   furosemide (LASIX) 20 MG tablet Take 1 tablet (20 mg total) by mouth daily as needed.   [START ON 01/29/2023] HYDROcodone-acetaminophen (NORCO) 5-325 MG tablet Take 1 tablet by mouth 2 (two) times daily as needed for severe pain. (Patient taking differently: Take 1 tablet by mouth in the morning and at bedtime.)   hydrOXYzine (ATARAX) 10 MG tablet Take 1 tablet (10 mg total) by mouth 3 (three) times daily as needed for anxiety. (Patient taking differently: Take 10 mg by mouth as needed for anxiety.)   metoprolol tartrate (LOPRESSOR) 25 MG tablet TAKE 1 TABLET BY MOUTH TWICE DAILY --PLEASE  CALL  OUR  OFFICE  TO  SCHEDULE  AN  APPT  FOR  FUTURE  REFILLS  --2ND  ATTEMPT (Patient taking differently: Take 25 mg by mouth 2 (two) times daily.)   predniSONE (DELTASONE) 20 MG tablet Take 2 tablets (40 mg total) by mouth daily with breakfast.   rosuvastatin (CRESTOR) 10 MG tablet Take 1 tablet (10 mg total) by mouth every Monday, Wednesday, and Friday.   Vitamin D, Ergocalciferol, (DRISDOL) 1.25 MG (50000 UNIT) CAPS capsule Take 1 capsule by  mouth once a week (Patient taking differently: Take 50,000 Units by mouth every Saturday.)   No facility-administered encounter medications on file as of 01/08/2023.    Diagnostic Tests Reviewed/Disposition: Reviewed on chart  Consults: GI, surgery  Discharge Instructions - Follow up with PCP in 1 week - Finish antibiotics - Low sodium heart healthy diet  Disease/illness Education: Discussed with patient  Home Health/Community Services  Discussions/Referrals: N/A  Establishment or re-establishment of referral orders for community resources: N/A  Discussion with other health care providers: Reviewed notes in chart  Assessment and Support of treatment regimen adherence: Discussed with patient  Appointments Coordinated with: N/A  Education for self-management, independent living, and ADLs: Discussed with patient    ROS See pertinent positives and negatives per HPI.    Objective:     BP 128/78 (BP Location: Left Arm, Cuff Size: Normal)   Pulse 81   Temp (!) 97.1 F (36.2 C)   Ht 5\' 1"  (1.549 m)   Wt 123 lb (55.8 kg)   LMP  (LMP Unknown)   SpO2 94%   BMI 23.24 kg/m    Physical Exam Vitals and nursing note reviewed.  Constitutional:      General: She is not in acute distress.    Appearance: Normal appearance.  HENT:     Head: Normocephalic.  Eyes:     Conjunctiva/sclera: Conjunctivae normal.  Cardiovascular:     Rate and Rhythm: Normal rate and regular rhythm.     Pulses: Normal pulses.     Heart sounds: Normal heart sounds.  Pulmonary:     Effort: Pulmonary effort is normal.     Breath sounds: Wheezing present.  Musculoskeletal:     Cervical back: Normal range of motion.  Skin:    General: Skin is warm.  Neurological:     General: No focal deficit present.     Mental Status: She is alert and oriented to person, place, and time.  Psychiatric:        Mood and Affect: Mood normal.        Behavior: Behavior normal.        Thought Content: Thought content normal.        Judgment: Judgment normal.    The 10-year ASCVD risk score (Arnett DK, et al., 2019) is: 22.3%    Assessment & Plan:   Problem List Items Addressed This Visit       Cardiovascular and Mediastinum   (HFpEF) heart failure with preserved ejection fraction (HCC) (Chronic)    Chronic, stable. She is euvolemic on exam. Recent BNP normal. Re-educated her to take the lasix 20mg  once daily as needed. Limit salt in diet. She is  following with cardiology and has a follow-up echocardiogram scheduled at the end of this month.       Aortic atherosclerosis (HCC)    Noted on CT scan 12/27/22. Continue aspirin 81mg  daily and rosuvastatin Monday, Wednesday, Friday.         Respiratory   Pneumonia due to infectious organism - Primary    Noted on CT scan on 12/27/22. She was discharged on 3 days of augmentin. She was still having a lot of shortness of breath and chest tightness, so the augmentin was extended for another 7 days and she was started on a z-pak which she completed. She does have wheezing on exam and still has some chest tightness. Will have her start prednisone 40mg  daily x5 days. Continue mucinex twice daily. If still no improvement, will repeat chest x-ray.  Continue inhalers as prescribed.       COPD (chronic obstructive pulmonary disease) (HCC)    Chronic, stable. Continue albuterol as needed. She has not been able to tolerate long acting inhalers in the past. She needs a new nebulizer machine as hers is old and not working as well. Will fax DME order for nebulizer to the number she provided.       Relevant Medications   predniSONE (DELTASONE) 20 MG tablet   Other Relevant Orders   For home use only DME Nebulizer machine     Other   History of small bowel obstruction    She was recently admitted to the hospital from 12/27/22 through 12/29/22. This was noted to be most likely from her history of Crohn's disease. She was treated with a NG tube and bowel rest. Her symptoms have improved. CT scan reviewed. Follow-up with any concerns.        Return in about 4 weeks (around 02/05/2023) for copd.    Gerre Scull, NP

## 2023-01-08 NOTE — Progress Notes (Signed)
Pt has been made aware of normal result and verbalized understanding.  jw

## 2023-01-08 NOTE — Assessment & Plan Note (Signed)
Chronic, stable. Continue albuterol as needed. She has not been able to tolerate long acting inhalers in the past. She needs a new nebulizer machine as hers is old and not working as well. Will fax DME order for nebulizer to the number she provided.

## 2023-01-08 NOTE — Assessment & Plan Note (Signed)
Noted on CT scan on 12/27/22. She was discharged on 3 days of augmentin. She was still having a lot of shortness of breath and chest tightness, so the augmentin was extended for another 7 days and she was started on a z-pak which she completed. She does have wheezing on exam and still has some chest tightness. Will have her start prednisone 40mg  daily x5 days. Continue mucinex twice daily. If still no improvement, will repeat chest x-ray. Continue inhalers as prescribed.

## 2023-01-08 NOTE — Assessment & Plan Note (Signed)
Chronic, stable. She is euvolemic on exam. Recent BNP normal. Re-educated her to take the lasix 20mg  once daily as needed. Limit salt in diet. She is following with cardiology and has a follow-up echocardiogram scheduled at the end of this month.

## 2023-01-08 NOTE — Patient Instructions (Signed)
It was great to see you!  Start prednisone 2 tablets daily in the morning with food  I will order you a new nebulizer  Stop the nexium when you finish the bottle.   Let's follow-up in 4 weeks, sooner if you have concerns.  If a referral was placed today, you will be contacted for an appointment. Please note that routine referrals can sometimes take up to 3-4 weeks to process. Please call our office if you haven't heard anything after this time frame.  Take care,  Rodman Pickle, NP

## 2023-01-09 ENCOUNTER — Telehealth: Payer: Self-pay | Admitting: Nurse Practitioner

## 2023-01-09 ENCOUNTER — Encounter (HOSPITAL_COMMUNITY): Payer: Medicare Other

## 2023-01-09 DIAGNOSIS — J439 Emphysema, unspecified: Secondary | ICD-10-CM

## 2023-01-09 NOTE — Telephone Encounter (Signed)
Ivor Costa from [facility] called and said you can reach her @ [phone]

## 2023-01-09 NOTE — Telephone Encounter (Signed)
LVM to return call.

## 2023-01-09 NOTE — Telephone Encounter (Signed)
[facility] Ivor Costa [phone]   They need specifics for the supplies to go with the nebulizer that was sent to them.  Like, tubing etc.

## 2023-01-10 DIAGNOSIS — F99 Mental disorder, not otherwise specified: Secondary | ICD-10-CM | POA: Diagnosis not present

## 2023-01-10 NOTE — Telephone Encounter (Signed)
I called and spoke with Erica [name] at [facility] and she said that for Rx of Nebulizer that the Rx needs to be more specific of supplies needed like tubing, if a mask is needed or the hand held nebulizer

## 2023-01-10 NOTE — Telephone Encounter (Signed)
Message documented in previous phone encounter

## 2023-01-10 NOTE — Telephone Encounter (Signed)
Pt called back I read her the message and she definitely she needs the nebulizer machine to please order.

## 2023-01-10 NOTE — Telephone Encounter (Signed)
Requesting: Albuterol Sulfate HFA 108 (90 Base) MCG/ACT Inhalation Aerosol Solution  Last Visit: 12/25/2022 Next Visit: 01/08/2023 Last Refill: 03/23/2022  Please Advise

## 2023-01-10 NOTE — Telephone Encounter (Signed)
LVM for patient to return call. 

## 2023-01-11 NOTE — Telephone Encounter (Signed)
I spoke with patient and she needs the nebulizer machine and 2 kits that she said that should have the tubing that she needs for neb machine.

## 2023-01-12 ENCOUNTER — Other Ambulatory Visit: Payer: Self-pay | Admitting: Physician Assistant

## 2023-01-12 NOTE — Telephone Encounter (Signed)
LVM at [facility] for a return call.

## 2023-01-15 NOTE — Telephone Encounter (Signed)
LVM to return call to [place] at [facility]

## 2023-01-16 NOTE — Telephone Encounter (Signed)
I spoke with Erica [name] at [facility] and she said that supplies needs to be changed to tubing kits on order and that patient's insurance will cover a nebulizer machine ever 5 years.

## 2023-01-17 DIAGNOSIS — F99 Mental disorder, not otherwise specified: Secondary | ICD-10-CM | POA: Diagnosis not present

## 2023-01-17 NOTE — Addendum Note (Signed)
Addended by: Rodman Pickle A on: 01/17/2023 11:11 AM   Modules accepted: Orders

## 2023-01-17 NOTE — Telephone Encounter (Signed)
Order faxed to [facility] at [phone]

## 2023-01-19 ENCOUNTER — Ambulatory Visit (INDEPENDENT_AMBULATORY_CARE_PROVIDER_SITE_OTHER): Payer: Medicare Other

## 2023-01-19 DIAGNOSIS — Z0000 Encounter for general adult medical examination without abnormal findings: Secondary | ICD-10-CM | POA: Diagnosis not present

## 2023-01-19 NOTE — Progress Notes (Signed)
Subjective:   Erica [name] is a 72 y.o. female who presents for Medicare Annual (Subsequent) preventive examination.  Visit Complete: Virtual I connected with  [name] on 01/19/23 by a audio enabled telemedicine application and verified that I am speaking with the correct person using two identifiers.  Patient Location: Home  Provider Location: Office/Clinic  I discussed the limitations of evaluation and management by telemedicine. The patient expressed understanding and agreed to proceed.  Vital Signs: Because this visit was a virtual/telehealth visit, some criteria may be missing or patient reported. Any vitals not documented were not able to be obtained and vitals that have been documented are patient reported.    Cardiac Risk Factors include: advanced age (>57men, >32 women);dyslipidemia;hypertension     Objective:    Today's Vitals   01/19/23 1326  PainSc: 7    There is no height or weight on file to calculate BMI.     01/19/2023    1:39 PM 12/28/2022   12:00 AM 11/14/2021    9:20 AM 01/05/2021    4:09 PM 09/05/2019   10:31 AM 04/24/2018    8:33 PM 04/24/2018    4:39 PM  Advanced Directives  Does Patient Have a Medical Advance Directive? Yes Yes Yes No No No No  Type of Estate agent of [place];Living will Living will Healthcare Power of [place];Living will      Does patient want to make changes to medical advance directive?  No - Patient declined       Copy of Healthcare Power of Attorney in Chart? Yes - validated most recent copy scanned in chart (See row information)  No - copy requested      Would patient like information on creating a medical advance directive?    No - Patient declined No - Patient declined No - Patient declined No - Patient declined    Current Medications (verified) Outpatient Encounter Medications as of 01/19/2023  Medication Sig   albuterol (PROVENTIL) (2.5 MG/3ML) 0.083% nebulizer solution Take 3 mLs (2.5 mg  total) by nebulization every 6 (six) hours as needed for wheezing or shortness of breath.   albuterol (VENTOLIN HFA) 108 (90 Base) MCG/ACT inhaler INHALE 2 PUFFS BY MOUTH EVERY 6 HOURS AS NEEDED FOR WHEEZING FOR SHORTNESS OF BREATH   aspirin EC 81 MG tablet Take 81 mg by mouth at bedtime.   cyclobenzaprine (FLEXERIL) 10 MG tablet Take 1 tablet by mouth three times daily as needed for muscle spasm (Patient taking differently: Take 10 mg by mouth as needed. for muscle spams)   esomeprazole (NEXIUM) 20 MG capsule Take 1 capsule (20 mg total) by mouth daily.   fluticasone (FLONASE) 50 MCG/ACT nasal spray Place 2 sprays into both nostrils daily. (Patient taking differently: Place 2 sprays into both nostrils daily as needed for allergies or rhinitis.)   [START ON 01/29/2023] HYDROcodone-acetaminophen (NORCO) 5-325 MG tablet Take 1 tablet by mouth 2 (two) times daily as needed for severe pain. (Patient taking differently: Take 1 tablet by mouth in the morning and at bedtime.)   hydrOXYzine (ATARAX) 10 MG tablet Take 1 tablet (10 mg total) by mouth 3 (three) times daily as needed for anxiety. (Patient taking differently: Take 10 mg by mouth as needed for anxiety.)   metoprolol tartrate (LOPRESSOR) 25 MG tablet TAKE 1 TABLET BY MOUTH TWICE DAILY **  PLEASE  CALL  OUR  OFFICE  TO  SCHEDULE  AN  APPOINTMENT  FOR  FUTURE  REFILLS.  2ND  ATTEMPT**   rosuvastatin (CRESTOR) 10 MG tablet Take 1 tablet (10 mg total) by mouth every Monday, Wednesday, and Friday.   Vitamin D, Ergocalciferol, (DRISDOL) 1.25 MG (50000 UNIT) CAPS capsule Take 1 capsule by mouth once a week (Patient taking differently: Take 50,000 Units by mouth every Saturday.)   amoxicillin-clavulanate (AUGMENTIN) 875-125 MG tablet Take 1 tablet by mouth 2 (two) times daily. (Patient not taking: Reported on 01/19/2023)   azithromycin (ZITHROMAX) 250 MG tablet Take 2 tablets today and then 1 tablet daily until gone (Patient not taking: Reported on 01/19/2023)    furosemide (LASIX) 20 MG tablet Take 1 tablet (20 mg total) by mouth daily as needed. (Patient not taking: Reported on 01/19/2023)   predniSONE (DELTASONE) 20 MG tablet Take 2 tablets (40 mg total) by mouth daily with breakfast. (Patient not taking: Reported on 01/19/2023)   No facility-administered encounter medications on file as of 01/19/2023.    Allergies (verified) Iodinated contrast media and Morphine   History: Past Medical History:  Diagnosis Date   Acute duodenitis 04/24/2017   Allergy    ANEMIA    Anxiety    BACK PAIN, LUMBAR    Cancer of ascending colon [id number] s/p colectomy 03/05/2009 02/18/2010   Qualifier: Diagnosis of  By: Leone Payor MD, Alfonse Ras E    CHF (congestive heart failure) (HCC)    Complete rotator cuff tear of left shoulder 11/27/2013   Ultrasound guided injection on November 27, 2013    COPD (chronic obstructive pulmonary disease) with emphysema (HCC)    COPD exacerbation (HCC) 11/14/2017   Crohn's 02/2010   ileal ulcers, intol of entercort--refuses treatment   Emphysema of lung (HCC)    GERD    History of blood transfusion    w/ c/s surgery   History of transfusion of whole blood    HYPERLIPIDEMIA    HYPERTENSION    Microscopic hematuria    chronic   Myocardial infarction (HCC) 2010   Narcotic dependence (HCC) 05/25/2017   Obstruction of intestine or colon (HCC)    adhesions   OSTEOARTHRITIS    Rectal fissure    Possible fissure   Rotator cuff tear    Takotsubo syndrome 12/2008   URINARY INCONTINENCE    Past Surgical History:  Procedure Laterality Date   ABDOMINAL HYSTERECTOMY  1994   APPENDECTOMY  1964   BLADDER SUSPENSION     CESAREAN SECTION     x 3   CHOLECYSTECTOMY  1995   COLONOSCOPY  2017   COLONOSCOPY W/ BIOPSIES AND POLYPECTOMY  01/24/2011   (Crohn's)ileitis, internal hemorrhoids   ECTOPIC PREGNANCY SURGERY     ESOPHAGOGASTRODUODENOSCOPY     FACIAL COSMETIC SURGERY     HAND SURGERY Bilateral 1991   x 2   KNEE SURGERY  Right 1981   LYSIS OF ADHESION  2010   ex lap/LOA for SBO Dr Johna Sheriff   ORBITAL FRACTURE SURGERY  1990   RIGHT COLECTOMY  03/2009   Right colectomy for colon CA.  Dr Dwain Sarna   TONSILLECTOMY  1952   TOTAL KNEE ARTHROPLASTY  03/2010   Dr Charlann Boxer.  Depuy   UPPER GASTROINTESTINAL ENDOSCOPY  05/16/2010   normal   UTERINE SUSPENSION     VIDEO BRONCHOSCOPY Bilateral 12/18/2014   Procedure: VIDEO BRONCHOSCOPY WITHOUT FLUORO;  Surgeon: Nyoka Cowden, MD;  Location: [facility];  Service: Cardiopulmonary;  Laterality: Bilateral;   Family History  Problem Relation Age of Onset   Throat cancer Mother    Colon cancer Father 14  Hypertension Father    Heart disease Father    Kidney disease Father    Colon cancer Sister 49   Liver cancer Sister    Other Sister        amyloidosis   Arthritis Other        Parent, other relative   Stomach cancer Neg Hx    Rectal cancer Neg Hx    Breast cancer Neg Hx    Social History   Socioeconomic History   Marital status: Widowed    Spouse name: Not on file   Number of children: 5   Years of education: 14   Highest education level: High school graduate  Occupational History   Occupation: Retired     Associate Professor: UNEMPLOYED  Tobacco Use   Smoking status: Every Day    Current packs/day: 1.00    Average packs/day: 1 pack/day for 53.0 years (53.0 ttl pk-yrs)    Types: Cigarettes    Passive exposure: Never   Smokeless tobacco: Never   Tobacco comments:    Smokes 0.5 packs of cigarettes daily ARJ 08/22/22  Vaping Use   Vaping status: Never Used  Substance and Sexual Activity   Alcohol use: Yes    Comment: occasional mixed drink   Drug use: Yes    Types: Hydrocodone   Sexual activity: Not Currently    Birth control/protection: Surgical    Comment: Hysterectomy  Other Topics Concern   Not on file  Social History Narrative   Patient is a widow.   Lives in an Apt. 1 son lives in [place] the other 4 adult children live in [place].   Continues  to smoke daily, occasional alcohol no drug use   Social Determinants of Health   Financial Resource Strain: Low Risk  (01/19/2023)   Overall Financial Resource Strain (CARDIA)    Difficulty of Paying Living Expenses: Not hard at all  Food Insecurity: No Food Insecurity (01/19/2023)   Hunger Vital Sign    Worried About Running Out of Food in the Last Year: Never true    Ran Out of Food in the Last Year: Never true  Transportation Needs: No Transportation Needs (01/19/2023)   PRAPARE - Administrator, Civil Service (Medical): No    Lack of Transportation (Non-Medical): No  Physical Activity: Inactive (01/19/2023)   Exercise Vital Sign    Days of Exercise per Week: 0 days    Minutes of Exercise per Session: 0 min  Stress: No Stress Concern Present (01/19/2023)   [employer] of Occupational Health - Occupational Stress Questionnaire    Feeling of Stress : Not at all  Social Connections: Moderately Integrated (01/19/2023)   Social Connection and Isolation Panel [NHANES]    Frequency of Communication with Friends and Family: More than three times a week    Frequency of Social Gatherings with Friends and Family: More than three times a week    Attends Religious Services: More than 4 times per year    Active Member of [employer] or Organizations: Yes    Attends Banker Meetings: More than 4 times per year    Marital Status: Widowed    Tobacco Counseling Ready to quit: Not Answered Counseling given: Not Answered Tobacco comments: Smokes 0.5 packs of cigarettes daily ARJ 08/22/22   Clinical Intake:  Pre-visit preparation completed: Yes  Pain : 0-10 Pain Score: 7  Pain Type: Chronic pain Pain Location: Back Pain Orientation: Lower Pain Descriptors / Indicators: Aching Pain Onset: More than a  month ago Pain Frequency: Constant     Nutritional Risks: None Diabetes: No  How often do you need to have someone help you when you read instructions,  pamphlets, or other written materials from your doctor or pharmacy?: 1 - Never  Interpreter Needed?: No  Information entered by :: NAllen LPN   Activities of Daily Living    01/19/2023    1:28 PM 12/28/2022   12:00 AM  In your present state of health, do you have any difficulty performing the following activities:  Hearing? 0 0  Vision? 0 0  Difficulty concentrating or making decisions? 0 0  Walking or climbing stairs? 0   Dressing or bathing? 0   Doing errands, shopping? 0 0  Preparing Food and eating ? N   Using the Toilet? N   In the past six months, have you accidently leaked urine? N   Do you have problems with loss of bowel control? N   Managing your Medications? N   Managing your Finances? N   Housekeeping or managing your Housekeeping? N     Patient Care Team: Gerre Scull, NP as PCP - General (Internal Medicine) Iva Boop, MD as Consulting Physician (Gastroenterology) Durene Romans, MD as Consulting Physician (Orthopedic Surgery) Venita Lick, MD as Consulting Physician (Orthopedic Surgery) Barnett Abu, MD as Consulting Physician (Neurosurgery) Judi Saa, DO as Consulting Physician (Pain Medicine) Pollyann Savoy, MD as Consulting Physician (Rheumatology) Nyoka Cowden, MD as Consulting Physician (Pulmonary Disease) Kathyrn Sheriff, [facility] (Inactive) as Pharmacist (Pharmacist)  Indicate any recent Medical Services you may have received from other than [facility] providers in the past year (date may be approximate).     Assessment:   This is a routine wellness examination for [place].  Hearing/Vision screen Hearing Screening - Comments:: Denies hearing issues Vision Screening - Comments:: regular eye exams, [facility]   Goals Addressed             This Visit's Progress    Patient Stated       01/19/2023, trying to cut down smoking       Depression Screen    01/19/2023    1:41 PM 11/20/2022    2:10 PM 03/23/2022     9:19 PM 11/14/2021    9:21 AM 11/14/2021    9:17 AM 07/26/2021   10:17 AM 05/30/2021   10:10 AM  PHQ 2/9 Scores  PHQ - 2 Score 0 0 0 0 0 0 0  PHQ- 9 Score  1    0     Fall Risk    01/19/2023    1:40 PM 12/25/2022   10:11 AM 11/20/2022    2:19 PM 06/22/2022   10:15 AM 11/14/2021    9:21 AM  Fall Risk   Falls in the past year? 0 0 0 0 0  Number falls in past yr: 0 0  0 0  Injury with Fall? 0 0  0 0  Risk for fall due to : Medication side effect No Fall Risks  No Fall Risks   Follow up Falls prevention discussed;Falls evaluation completed Falls evaluation completed  Falls evaluation completed Falls evaluation completed;Education provided    MEDICARE RISK AT HOME: Medicare Risk at Home Any stairs in or around the home?: Yes If so, are there any without handrails?: No Home free of loose throw rugs in walkways, pet beds, electrical cords, etc?: Yes Adequate lighting in your home to reduce risk of falls?: Yes Life alert?: No Use  of a cane, walker or w/c?: No Grab bars in the bathroom?: No Shower chair or bench in shower?: No Elevated toilet seat or a handicapped toilet?: Yes  TIMED UP AND GO:  Was the test performed?  No    Cognitive Function:        01/19/2023    1:41 PM  6CIT Screen  What Year? 0 points  What month? 0 points  What time? 0 points  Count back from 20 0 points  Months in reverse 0 points  Repeat phrase 0 points  Total Score 0 points    Immunizations Immunization History  Administered Date(s) Administered   Fluad Quad(high Dose 65+) 12/05/2018, 03/23/2022   Fluad Trivalent(High Dose 65+) 12/25/2022   Influenza Split 01/06/2009, 02/27/2011   Influenza Whole 02/18/2010   Influenza, High Dose Seasonal PF 02/20/2017, 02/08/2018   Influenza,inj,Quad PF,6+ Mos 01/22/2013   PFIZER(Purple Top)SARS-COV-2 Vaccination 07/10/2019, 08/04/2019, 03/13/2020, 11/25/2020   Pneumococcal Conjugate-13 03/22/2015   Pneumococcal Polysaccharide-23 04/03/2008, 09/18/2016    Td 04/03/2004   Tdap 09/17/2014   Zoster, Live 11/18/2010    TDAP status: Up to date  Flu Vaccine status: Up to date  Pneumococcal vaccine status: Up to date  Covid-19 vaccine status: Information provided on how to obtain vaccines.   Qualifies for Shingles Vaccine? Yes   Zostavax completed Yes   Shingrix Completed?: No.    Education has been provided regarding the importance of this vaccine. Patient has been advised to call insurance company to determine out of pocket expense if they have not yet received this vaccine. Advised may also receive vaccine at local pharmacy or Health Dept. Verbalized acceptance and understanding.  Screening Tests Health Maintenance  Topic Date Due   Zoster Vaccines- Shingrix (1 of 2) 03/16/1970   COVID-19 Vaccine (5 - 2023-24 season) 12/03/2022   Hepatitis C Screening  06/22/2023 (Originally 03/16/1969)   Lung Cancer Screening  08/08/2023   Medicare Annual Wellness (AWV)  01/19/2024   MAMMOGRAM  08/20/2024   DTaP/Tdap/Td (3 - Td or Tdap) 09/16/2024   Colonoscopy  10/03/2025   Pneumonia Vaccine 11+ Years old  Completed   INFLUENZA VACCINE  Completed   DEXA SCAN  Completed   HPV VACCINES  Aged Out    Health Maintenance  Health Maintenance Due  Topic Date Due   Zoster Vaccines- Shingrix (1 of 2) 03/16/1970   COVID-19 Vaccine (5 - 2023-24 season) 12/03/2022    Colorectal cancer screening: Type of screening: Colonoscopy. Completed 10/04/2022. Repeat every 3 years  Mammogram status: Completed 08/21/2022. Repeat every year  Bone Density status: Completed 12/22/2016.   Lung Cancer Screening: (Low Dose CT Chest recommended if Age 67-80 years, 20 pack-year currently smoking OR have quit w/in 15years.) does qualify.   Lung Cancer Screening Referral: CT scan 08/08/2022  Additional Screening:  Hepatitis C Screening: does not qualify;   Vision Screening: Recommended annual ophthalmology exams for early detection of glaucoma and other disorders of the  eye. Is the patient up to date with their annual eye exam?  Yes  Who is the provider or what is the name of the office in which the patient attends annual eye exams? [facility] If pt is not established with a provider, would they like to be referred to a provider to establish care? No .   Dental Screening: Recommended annual dental exams for proper oral hygiene  Diabetic Foot Exam: n/a  Community Resource Referral / Chronic Care Management: CRR required this visit?  No   CCM required  this visit?  No     Plan:     I have personally reviewed and noted the following in the patient's chart:   Medical and social history Use of alcohol, tobacco or illicit drugs  Current medications and supplements including opioid prescriptions. Patient is currently taking opioid prescriptions. Information provided to patient regarding non-opioid alternatives. Patient advised to discuss non-opioid treatment plan with their provider. Functional ability and status Nutritional status Physical activity Advanced directives List of other physicians Hospitalizations, surgeries, and ER visits in previous 12 months Vitals Screenings to include cognitive, depression, and falls Referrals and appointments  In addition, I have reviewed and discussed with patient certain preventive protocols, quality metrics, and best practice recommendations. A written personalized care plan for preventive services as well as general preventive health recommendations were provided to patient.     Barb Merino, LPN   27/25/3664   After Visit Summary: (Pick Up) Due to this being a telephonic visit, with patients personalized plan was offered to patient and patient has requested to Pick up at office.  Nurse Notes: none

## 2023-01-19 NOTE — Patient Instructions (Signed)
Erica [name] , Thank you for taking time to come for your Medicare Wellness Visit. I appreciate your ongoing commitment to your health goals. Please review the following plan we discussed and let me know if I can assist you in the future.   Referrals/Orders/Follow-Ups/Clinician Recommendations: none  Managing Pain Without Opioids Opioids are strong medicines used to treat moderate to severe pain. For some people, especially those who have long-term (chronic) pain, opioids may not be the best choice for pain management due to: Side effects like nausea, constipation, and sleepiness. The risk of addiction (opioid use disorder). The longer you take opioids, the greater your risk of addiction. Pain that lasts for more than 3 months is called chronic pain. Managing chronic pain usually requires more than one approach and is often provided by a team of health care providers working together (multidisciplinary approach). Pain management may be done at a pain management center or pain clinic. How to manage pain without the use of opioids Use non-opioid medicines Non-opioid medicines for pain may include: Over-the-counter or prescription non-steroidal anti-inflammatory drugs (NSAIDs). These may be the first medicines used for pain. They work well for muscle and bone pain, and they reduce swelling. Acetaminophen. This over-the-counter medicine may work well for milder pain but not swelling. Antidepressants. These may be used to treat chronic pain. A certain type of antidepressant (tricyclics) is often used. These medicines are given in lower doses for pain than when used for depression. Anticonvulsants. These are usually used to treat seizures but may also reduce nerve (neuropathic) pain. Muscle relaxants. These relieve pain caused by sudden muscle tightening (spasms). You may also use a pain medicine that is applied to the skin as a patch, cream, or gel (topical analgesic), such as a numbing medicine. These  may cause fewer side effects than medicines taken by mouth. Do certain therapies as directed Some therapies can help with pain management. They include: Physical therapy. You will do exercises to gain strength and flexibility. A physical therapist may teach you exercises to move and stretch parts of your body that are weak, stiff, or painful. You can learn these exercises at physical therapy visits and practice them at home. Physical therapy may also involve: Massage. Heat wraps or applying heat or cold to affected areas. Electrical signals that interrupt pain signals (transcutaneous electrical nerve stimulation, TENS). Weak lasers that reduce pain and swelling (low-level laser therapy). Signals from your body that help you learn to regulate pain (biofeedback). Occupational therapy. This helps you to learn ways to function at home and work with less pain. Recreational therapy. This involves trying new activities or hobbies, such as a physical activity or drawing. Mental health therapy, including: Cognitive behavioral therapy (CBT). This helps you learn coping skills for dealing with pain. Acceptance and commitment therapy (ACT) to change the way you think and react to pain. Relaxation therapies, including muscle relaxation exercises and mindfulness-based stress reduction. Pain management counseling. This may be individual, family, or group counseling.  Receive medical treatments Medical treatments for pain management include: Nerve block injections. These may include a pain blocker and anti-inflammatory medicines. You may have injections: Near the spine to relieve chronic back or neck pain. Into joints to relieve back or joint pain. Into nerve areas that supply a painful area to relieve body pain. Into muscles (trigger point injections) to relieve some painful muscle conditions. A medical device placed near your spine to help block pain signals and relieve nerve pain or chronic back pain  (spinal  cord stimulation device). Acupuncture. Follow these instructions at home Medicines Take over-the-counter and prescription medicines only as told by your health care provider. If you are taking pain medicine, ask your health care providers about possible side effects to watch out for. Do not drive or use heavy machinery while taking prescription opioid pain medicine. Lifestyle  Do not use drugs or alcohol to reduce pain. If you drink alcohol, limit how much you have to: 0-1 drink a day for women who are not pregnant. 0-2 drinks a day for men. Know how much alcohol is in a drink. In the [place] one drink equals one 12 oz bottle of beer (355 mL), one 5 oz glass of wine (148 mL), or one 1 oz glass of hard liquor (44 mL). Do not use any products that contain nicotine or tobacco. These products include cigarettes, chewing tobacco, and vaping devices, such as e-cigarettes. If you need help quitting, ask your health care provider. Eat a healthy diet and maintain a healthy weight. Poor diet and excess weight may make pain worse. Eat foods that are high in fiber. These include fresh fruits and vegetables, whole grains, and beans. Limit foods that are high in fat and processed sugars, such as fried and sweet foods. Exercise regularly. Exercise lowers stress and may help relieve pain. Ask your health care provider what activities and exercises are safe for you. If your health care provider approves, join an exercise class that combines movement and stress reduction. Examples include yoga and tai chi. Get enough sleep. Lack of sleep may make pain worse. Lower stress as much as possible. Practice stress reduction techniques as told by your therapist. General instructions Work with all your pain management providers to find the treatments that work best for you. You are an important member of your pain management team. There are many things you can do to reduce pain on your own. Consider joining an  online or in-person support group for people who have chronic pain. Keep all follow-up visits. This is important. Where to find more information You can find more information about managing pain without opioids from: American Academy of Pain Medicine: painmed.org Institute for Chronic Pain: instituteforchronicpain.org American Chronic Pain Association: theacpa.org Contact a health care provider if: You have side effects from pain medicine. Your pain gets worse or does not get better with treatments or home therapy. You are struggling with anxiety or depression. Summary Many types of pain can be managed without opioids. Chronic pain may respond better to pain management without opioids. Pain is best managed when you and a team of health care providers work together. Pain management without opioids may include non-opioid medicines, medical treatments, physical therapy, mental health therapy, and lifestyle changes. Tell your health care providers if your pain gets worse or is not being managed well enough. This information is not intended to replace advice given to you by your health care provider. Make sure you discuss any questions you have with your health care provider. Document Revised: 06/30/2020 Document Reviewed: 06/30/2020 Elsevier Patient Education  2024 [employer].   This is a list of the screening recommended for you and due dates:  Health Maintenance  Topic Date Due   Zoster (Shingles) Vaccine (1 of 2) 03/16/1970   COVID-19 Vaccine (5 - 2023-24 season) 12/03/2022   Hepatitis C Screening  06/22/2023*   Screening for Lung Cancer  08/08/2023   Medicare Annual Wellness Visit  01/19/2024   Mammogram  08/20/2024   DTaP/Tdap/Td vaccine (3 -  Td or Tdap) 09/16/2024   Colon Cancer Screening  10/03/2025   Pneumonia Vaccine  Completed   Flu Shot  Completed   DEXA scan (bone density measurement)  Completed   HPV Vaccine  Aged Out  *Topic was postponed. The date shown is not the  original due date.    Advanced directives: (In Chart) A copy of your advanced directives are scanned into your chart should your provider ever need it.  Next Medicare Annual Wellness Visit scheduled for next year: Yes  Insert Preventive Care attachment Insert FALL PREVENTION attachment if needed

## 2023-01-24 DIAGNOSIS — F99 Mental disorder, not otherwise specified: Secondary | ICD-10-CM | POA: Diagnosis not present

## 2023-01-28 DIAGNOSIS — J441 Chronic obstructive pulmonary disease with (acute) exacerbation: Secondary | ICD-10-CM | POA: Diagnosis not present

## 2023-01-31 ENCOUNTER — Other Ambulatory Visit (HOSPITAL_COMMUNITY): Payer: Medicare Other

## 2023-01-31 ENCOUNTER — Other Ambulatory Visit: Payer: Self-pay | Admitting: Nurse Practitioner

## 2023-01-31 DIAGNOSIS — F064 Anxiety disorder due to known physiological condition: Secondary | ICD-10-CM | POA: Diagnosis not present

## 2023-02-02 ENCOUNTER — Telehealth: Payer: Self-pay | Admitting: *Deleted

## 2023-02-02 NOTE — Telephone Encounter (Signed)
Call placed to pt.  Left a message for her to call back.

## 2023-02-02 NOTE — Telephone Encounter (Signed)
-----   Message from [place] sent at 01/31/2023  5:40 PM EDT ----- Echocardiogram was to evaluate worsening shortness of breath. If she is doing ok, would reschedule echocardiogram and then follow up. If she feels she is worse with shortness of breath, would leave appt as is and try to get echocardiogram as soon as possible. [place] ----- Message ----- From: Elliot Cousin, RMA Sent: 01/31/2023   1:17 PM EDT To: Beatrice Lecher, PA-C  Pt cancelled her Echo for today and didn't reschedule.. scheduled to see [place] back 11/15.  Does she need echo 1st?

## 2023-02-05 ENCOUNTER — Telehealth (HOSPITAL_COMMUNITY): Payer: Self-pay | Admitting: Physician Assistant

## 2023-02-05 NOTE — Telephone Encounter (Signed)
I would let her call when she is ready to schedule it. Tereso Newcomer, PA-C    02/05/2023 4:56 PM

## 2023-02-05 NOTE — Telephone Encounter (Signed)
I Called to schedule patients Echocardiogram that she had cancelled. Patient was very upset  and  short with me that she had gotten a 4th phone call about it and does not want to schedule.  I informed her that I had called t follow up on 11/1 and today. I told her that I would take her out of the active echo wq and when she wanted to reschedule she could call us. Thank you

## 2023-02-06 ENCOUNTER — Encounter: Payer: Self-pay | Admitting: Nurse Practitioner

## 2023-02-06 ENCOUNTER — Ambulatory Visit (INDEPENDENT_AMBULATORY_CARE_PROVIDER_SITE_OTHER): Payer: Medicare Other | Admitting: Nurse Practitioner

## 2023-02-06 VITALS — BP 108/80 | HR 74 | Temp 97.6°F | Ht 61.0 in | Wt 119.8 lb

## 2023-02-06 DIAGNOSIS — J439 Emphysema, unspecified: Secondary | ICD-10-CM | POA: Diagnosis not present

## 2023-02-06 MED ORDER — VITAMIN D (ERGOCALCIFEROL) 1.25 MG (50000 UNIT) PO CAPS: 50000.0000 [IU] | ORAL_CAPSULE | ORAL | 0 refills | Status: DC

## 2023-02-06 MED ORDER — TRELEGY ELLIPTA 100-62.5-25 MCG/ACT IN AEPB: 1.0000 | INHALATION_SPRAY | Freq: Every day | RESPIRATORY_TRACT | 11 refills | Status: DC

## 2023-02-06 MED ORDER — ROSUVASTATIN CALCIUM 10 MG PO TABS: 10.0000 mg | ORAL_TABLET | ORAL | 3 refills | Status: DC

## 2023-02-06 MED ORDER — ALBUTEROL SULFATE HFA 108 (90 BASE) MCG/ACT IN AERS: 2.0000 | INHALATION_SPRAY | RESPIRATORY_TRACT | 6 refills | Status: DC | PRN

## 2023-02-06 MED ORDER — LEVOFLOXACIN 500 MG PO TABS: 500.0000 mg | ORAL_TABLET | Freq: Every day | ORAL | 0 refills | Status: AC

## 2023-02-06 NOTE — Assessment & Plan Note (Signed)
Chronic, not controlled. Following a recent urgent care visit where an x-ray confirmed pneumonia, she reports ongoing fatigue and difficulty breathing despite being on prednisone and amoxicillin. We will start Levaquin 500mg  once daily for 7 days, continue weaning off prednisone as previously instructed, and keep using the albuterol inhaler as needed. Will start trelegy inhaler daily with instructions to rinse the mouth after use to prevent thrush. She should check in with lung specialist Dr. Sherene Sires and follow up in 4 weeks or sooner if any issues arise. We encourage her to continue taking Mucinex and drinking plenty of water to aid in mucus clearance. She should contact the office if any side effects or issues arise with the new medication regimen.

## 2023-02-06 NOTE — Patient Instructions (Signed)
It was great to see you!  Start levaquin antibiotic 1 tablet daily  Keep using your albuterol inhaler as needed  Call [facility] about the nebulizer.   Let's follow-up in 4 weeks, sooner if you have concerns.  If a referral was placed today, you will be contacted for an appointment. Please note that routine referrals can sometimes take up to 3-4 weeks to process. Please call our office if you haven't heard anything after this time frame.  Take care,  Rodman Pickle, NP

## 2023-02-06 NOTE — Progress Notes (Signed)
Established Patient Office Visit  Subjective   Patient ID: Erica [name], female    DOB: May 30, 1950  Age: 72 y.o. MRN: [id number]  Chief Complaint  Patient presents with   Pneumonia    Follow up, went to Urgent Care on 01-28-23, Rx refills    HPI  Discussed the use of AI scribe software for clinical note transcription with the patient, who gave verbal consent to proceed.  History of Present Illness   The patient, with a history of COPD and emphysema, presents with ongoing respiratory distress and frequent bouts of pneumonia. She reports feeling fatigued and struggling with her breathing. She has been coughing up phlegm, but it is not green in color. She has been using an albuterol inhaler and taking Mucinex to manage her symptoms. She has also been on antibiotics and prednisone, prescribed by an urgent care doctor during a recent visit. The patient has been proactive in managing her health, but expresses frustration with her cardiologist's office, feeling that she is not receiving direct communication from the cardiologist. She has been prescribed a nebulizer machine but has not received it yet.        ROS See pertinent positives and negatives per HPI.    Objective:     BP 108/80 (BP Location: Left Arm)   Pulse 74   Temp 97.6 F (36.4 C)   Ht 5\' 1"  (1.549 m)   Wt 119 lb 12.8 oz (54.3 kg)   LMP  (LMP Unknown)   SpO2 93%   BMI 22.64 kg/m    Physical Exam Vitals and nursing note reviewed.  Constitutional:      General: She is not in acute distress.    Appearance: Normal appearance.  HENT:     Head: Normocephalic.  Eyes:     Conjunctiva/sclera: Conjunctivae normal.  Cardiovascular:     Rate and Rhythm: Normal rate and regular rhythm.     Pulses: Normal pulses.     Heart sounds: Normal heart sounds.  Pulmonary:     Effort: Pulmonary effort is normal.     Breath sounds: Rhonchi present.  Musculoskeletal:     Cervical back: Normal range of motion.  Skin:     General: Skin is warm.  Neurological:     General: No focal deficit present.     Mental Status: She is alert and oriented to person, place, and time.  Psychiatric:        Mood and Affect: Mood normal.        Behavior: Behavior normal.        Thought Content: Thought content normal.        Judgment: Judgment normal.     The 10-year ASCVD risk score (Arnett DK, et al., 2019) is: 16.4%    Assessment & Plan:   Problem List Items Addressed This Visit       Respiratory   COPD (chronic obstructive pulmonary disease) (HCC) - Primary    Chronic, not controlled. Following a recent urgent care visit where an x-ray confirmed pneumonia, she reports ongoing fatigue and difficulty breathing despite being on prednisone and amoxicillin. We will start Levaquin 500mg  once daily for 7 days, continue weaning off prednisone as previously instructed, and keep using the albuterol inhaler as needed. Will start trelegy inhaler daily with instructions to rinse the mouth after use to prevent thrush. She should check in with lung specialist Dr. Sherene Sires and follow up in 4 weeks or sooner if any issues arise. We encourage her to continue taking  Mucinex and drinking plenty of water to aid in mucus clearance. She should contact the office if any side effects or issues arise with the new medication regimen.            Relevant Medications   predniSONE (DELTASONE) 10 MG tablet   albuterol (VENTOLIN HFA) 108 (90 Base) MCG/ACT inhaler   Fluticasone-Umeclidin-Vilant (TRELEGY ELLIPTA) 100-62.5-25 MCG/ACT AEPB    Return in about 4 weeks (around 03/06/2023) for COPD.    Gerre Scull, NP

## 2023-02-06 NOTE — Telephone Encounter (Signed)
Patient was in office today and said that she has not received nebulizer and supplies. I called [facility] and spoke with [place] and she said that they had called patient several times and left a message to verify address and she hasn't reached back to them. I verified patient's phone number and they will reach back out to patient today.

## 2023-02-07 DIAGNOSIS — F064 Anxiety disorder due to known physiological condition: Secondary | ICD-10-CM | POA: Diagnosis not present

## 2023-02-08 NOTE — Telephone Encounter (Signed)
Pt stated she is battling Pnuemonia and she wasn't going anywhere until she is better.  Cancelled her Echo and her f/u appt and states she will call back to reschedule.

## 2023-02-09 NOTE — Telephone Encounter (Signed)
I spoke with Erica [name] and gave her contact information and phone number to [facility].

## 2023-02-09 NOTE — Telephone Encounter (Signed)
Pt is saying she still does not have the nebulizer and does not want to be charged for it. They have her correct address, but have mailed it to [place]. Please advise pt at   [phone] [facility])

## 2023-02-09 NOTE — Telephone Encounter (Signed)
I called [facility] and spoke with [place] and she said that patient's nebulizer is in the process of shipping and it takes 2 days.  I then called patient and LVM for patient to return call.

## 2023-02-14 DIAGNOSIS — F064 Anxiety disorder due to known physiological condition: Secondary | ICD-10-CM | POA: Diagnosis not present

## 2023-02-15 ENCOUNTER — Ambulatory Visit: Payer: Medicare Other | Admitting: Cardiovascular Disease

## 2023-02-16 ENCOUNTER — Ambulatory Visit: Payer: Medicare Other | Admitting: Physician Assistant

## 2023-02-16 ENCOUNTER — Other Ambulatory Visit: Payer: Medicare Other

## 2023-02-21 ENCOUNTER — Other Ambulatory Visit: Payer: Self-pay

## 2023-02-21 DIAGNOSIS — I5032 Chronic diastolic (congestive) heart failure: Secondary | ICD-10-CM

## 2023-02-21 DIAGNOSIS — R609 Edema, unspecified: Secondary | ICD-10-CM

## 2023-02-21 DIAGNOSIS — Z720 Tobacco use: Secondary | ICD-10-CM

## 2023-02-21 DIAGNOSIS — I10 Essential (primary) hypertension: Secondary | ICD-10-CM

## 2023-02-21 DIAGNOSIS — I2510 Atherosclerotic heart disease of native coronary artery without angina pectoris: Secondary | ICD-10-CM

## 2023-02-21 DIAGNOSIS — E782 Mixed hyperlipidemia: Secondary | ICD-10-CM

## 2023-02-21 DIAGNOSIS — R600 Localized edema: Secondary | ICD-10-CM

## 2023-02-24 ENCOUNTER — Other Ambulatory Visit: Payer: Self-pay | Admitting: Nurse Practitioner

## 2023-02-27 DIAGNOSIS — F064 Anxiety disorder due to known physiological condition: Secondary | ICD-10-CM | POA: Diagnosis not present

## 2023-02-28 ENCOUNTER — Encounter
Payer: Medicare Other | Attending: Physical Medicine and Rehabilitation | Admitting: Physical Medicine and Rehabilitation

## 2023-02-28 ENCOUNTER — Encounter: Payer: Self-pay | Admitting: Physical Medicine and Rehabilitation

## 2023-02-28 ENCOUNTER — Other Ambulatory Visit: Payer: Self-pay | Admitting: Physician Assistant

## 2023-02-28 VITALS — BP 146/82 | HR 87 | Ht 61.0 in | Wt 122.0 lb

## 2023-02-28 DIAGNOSIS — M5010 Cervical disc disorder with radiculopathy, unspecified cervical region: Secondary | ICD-10-CM | POA: Diagnosis not present

## 2023-02-28 DIAGNOSIS — G894 Chronic pain syndrome: Secondary | ICD-10-CM | POA: Insufficient documentation

## 2023-02-28 DIAGNOSIS — G8929 Other chronic pain: Secondary | ICD-10-CM | POA: Diagnosis not present

## 2023-02-28 DIAGNOSIS — M5416 Radiculopathy, lumbar region: Secondary | ICD-10-CM | POA: Diagnosis not present

## 2023-02-28 DIAGNOSIS — M158 Other polyosteoarthritis: Secondary | ICD-10-CM | POA: Insufficient documentation

## 2023-02-28 DIAGNOSIS — Z79899 Other long term (current) drug therapy: Secondary | ICD-10-CM | POA: Diagnosis not present

## 2023-02-28 MED ORDER — HYDROCODONE-ACETAMINOPHEN 5-325 MG PO TABS: 1.0000 | ORAL_TABLET | Freq: Two times a day (BID) | ORAL | 0 refills | Status: AC

## 2023-02-28 MED ORDER — HYDROCODONE-ACETAMINOPHEN 5-325 MG PO TABS: 1.0000 | ORAL_TABLET | Freq: Two times a day (BID) | ORAL | 0 refills | Status: DC

## 2023-02-28 NOTE — Progress Notes (Signed)
Subjective:    Patient ID: Erica [name], female    DOB: 16-Apr-1950, 72 y.o.   MRN: [id number]  HPI  Erica [name] is a 72 y.o. year old female  who  has a past medical history of Acute duodenitis (04/24/2017), Allergy, ANEMIA, Anxiety, BACK PAIN, LUMBAR, Cancer of ascending colon pTispN0 s/p colectomy 03/05/2009 (02/18/2010), CHF (congestive heart failure) (HCC), Complete rotator cuff tear of left shoulder (11/27/2013), COPD (chronic obstructive pulmonary disease) with emphysema (HCC), COPD exacerbation (HCC) (11/14/2017), Crohn's (02/2010), Emphysema of lung (HCC), GERD, History of blood transfusion, History of transfusion of whole blood, HYPERLIPIDEMIA, HYPERTENSION, Microscopic hematuria, Myocardial infarction (HCC) (2010), Narcotic dependence (HCC) (05/25/2017), Obstruction of intestine or colon (HCC), OSTEOARTHRITIS, Rectal fissure, Rotator cuff tear, Takotsubo syndrome (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   They are presenting to PM&R clinic for follow up related to  chronic neck and back pain.  .  Plan from last visit:   Chronic pain syndrome Other osteoarthritis involving multiple joints   Patient referred for trial of Low-Dose naltrexone; patient refuses at this time. Current regimen is rare use of cyclobenzaprine. Has failed gabapentin (? Reason) in the past. Left chronic pain management Dr. Hillard Danker after 10/21/21 due to poor experience with clinic; was on Norco 5 mg TID.    Today, we discussed multiple options for pain control, including: return to physical therapy returning to Dr. Rubye Oaks to try medial branch blocks for cervical arthritis seen on imaging given local nature of current symptoms starting low-dose naltrexone.     While we have not opted to do any of these interventions at this time, I will let Dr. Katrinka Blazing know about our discussion and you can follow-up with him regarding returning to Dr. Rubye Oaks.   Today, we are getting a urine drug screen and filling out  a pain contract.  I will also need you to fill out a records request form from Dr. Frutoso Chase office so we can get your last visit appointment and lab records with her.  I will call you once we have these items together, and discuss whether or not you would be appropriate for long-term opiate pain management.  This is not a guarantee of medication prescription   Continue your home exercises, heat, and Tylenol as needed.  I will not schedule a follow-up with you until we have an opportunity to talk over the phone in approximately 1 week.   Cervical disc disorder with radiculopathy of cervical region Management as above; recommended trial of medial branch blocks    Reviewed MRI C spine 06/06/2020: 1. Prominent reactive marrow edema about the left C2-3 facet due to facet arthritis. Finding could serve as a source for left posterior neck pain. 2. Degenerative spondylosis and facet arthrosis at C3-4 through C5-6 with resultant mild to moderate right C4 through C6 foraminal narrowing. 3. Broad central disc protrusion at C6-7 with resultant mild spinal stenosis.   Lumbar radiculopathy, acute Management as above   Last ESI with Dr. Rubye Oaks 09/26/21 L4/5; with benefit, although did not last long   prior MRI L Spine 07/23/21   L3-L4: Trace, grade 1 anterolisthesis. Moderate spinal canal and bilateral subarticular stenosis. Moderate-severe bilateral neural foraminal stenosis. Potential impingement of the bilateral descending and exiting nerve roots.   L4-L5: Grade 1 anterolisthesis. Moderate bilateral subarticular stenosis. Moderate-severe bilateral neural foraminal stenosis. Potential impingement of the bilateral descending and exiting nerve roots.   L5-S1: Moderate-severe bilateral neural foraminal stenosis, potentially impinging the exiting nerve roots.   Unchanged left adrenal  adenoma measuring up to 3.0 cm.   Interval Hx:  - Therapies: She states "I go up and down 19 stairs" every day  to her apartment; she is independent of ADLs and "I am constantly moving around". She makes sure to hold onto the railing.    - Follow ups: cervical and lumbar epidural steroid injections - had them done shortly after August; she states she had a reaction to the medications in the nerve blocks causing a rash. Next time she will get pre-medicated with benadryll. She thinks they lasted for about a month; the cervical lasted longer than the low back. They are done by Dr. Rubye Oaks.    - Falls: none   - DME: Has a single point cane that she uses occassionally.    - Medications: Norco 5 mg twice daily as needed, last filled 10/29. "It takes the edge off the pain, especially in the morning". She states this is the worst time of day for her. She also takes a dose at nighttime if she has been very active. She states she does consistently take this many and it keeps her functional.   Stopped flexeril, only took once since last visit because "I dont like the feeling, I just use it for spasms".    - Other concerns: Had pneumonia a few times recently.    Pain Inventory Average Pain 7 Pain Right Now 8 My pain is sharp  In the last 24 hours, has pain interfered with the following? General activity 0 Relation with others 0 Enjoyment of life 0 What TIME of day is your pain at its worst? varies Sleep (in general) Fair  Pain is worse with: walking and bending Pain improves with: medication Relief from Meds: 8  Family History  Problem Relation Age of Onset   Throat cancer Mother    Colon cancer Father 29   Hypertension Father    Heart disease Father    Kidney disease Father    Colon cancer Sister 49   Liver cancer Sister    Other Sister        amyloidosis   Arthritis Other        Parent, other relative   Stomach cancer Neg Hx    Rectal cancer Neg Hx    Breast cancer Neg Hx    Social History   Socioeconomic History   Marital status: Widowed    Spouse name: Not on file   Number of  children: 5   Years of education: 14   Highest education level: High school graduate  Occupational History   Occupation: Retired     Associate Professor: UNEMPLOYED  Tobacco Use   Smoking status: Every Day    Current packs/day: 1.00    Average packs/day: 1 pack/day for 53.0 years (53.0 ttl pk-yrs)    Types: Cigarettes    Passive exposure: Never   Smokeless tobacco: Never   Tobacco comments:    Smokes 0.5 packs of cigarettes daily [facility] 08/22/22  Vaping Use   Vaping status: Never Used  Substance and Sexual Activity   Alcohol use: Yes    Comment: occasional mixed drink   Drug use: Yes    Types: Hydrocodone   Sexual activity: Not Currently    Birth control/protection: Surgical    Comment: Hysterectomy  Other Topics Concern   Not on file  Social History Narrative   Patient is a widow.   Lives in an Apt. 1 son lives in [place] the other 4 adult children live in [place].  Continues to smoke daily, occasional alcohol no drug use   Social Determinants of Health   Financial Resource Strain: Low Risk  (01/19/2023)   Overall Financial Resource Strain (CARDIA)    Difficulty of Paying Living Expenses: Not hard at all  Food Insecurity: No Food Insecurity (01/19/2023)   Hunger Vital Sign    Worried About Running Out of Food in the Last Year: Never true    Ran Out of Food in the Last Year: Never true  Transportation Needs: No Transportation Needs (01/19/2023)   PRAPARE - Administrator, Civil Service (Medical): No    Lack of Transportation (Non-Medical): No  Physical Activity: Inactive (01/19/2023)   Exercise Vital Sign    Days of Exercise per Week: 0 days    Minutes of Exercise per Session: 0 min  Stress: No Stress Concern Present (01/19/2023)   [employer] of Occupational Health - Occupational Stress Questionnaire    Feeling of Stress : Not at all  Social Connections: Moderately Integrated (01/19/2023)   Social Connection and Isolation Panel [NHANES]    Frequency of  Communication with Friends and Family: More than three times a week    Frequency of Social Gatherings with Friends and Family: More than three times a week    Attends Religious Services: More than 4 times per year    Active Member of [employer] or Organizations: Yes    Attends Banker Meetings: More than 4 times per year    Marital Status: Widowed   Past Surgical History:  Procedure Laterality Date   ABDOMINAL HYSTERECTOMY  1994   APPENDECTOMY  1964   BLADDER SUSPENSION     CESAREAN SECTION     x 3   CHOLECYSTECTOMY  1995   COLONOSCOPY  2017   COLONOSCOPY W/ BIOPSIES AND POLYPECTOMY  01/24/2011   (Crohn's)ileitis, internal hemorrhoids   ECTOPIC PREGNANCY SURGERY     ESOPHAGOGASTRODUODENOSCOPY     FACIAL COSMETIC SURGERY     HAND SURGERY Bilateral 1991   x 2   KNEE SURGERY Right 1981   LYSIS OF ADHESION  2010   ex lap/LOA for SBO Dr Johna Sheriff   ORBITAL FRACTURE SURGERY  1990   RIGHT COLECTOMY  03/2009   Right colectomy for colon CA.  Dr Dwain Sarna   TONSILLECTOMY  1952   TOTAL KNEE ARTHROPLASTY  03/2010   Dr Charlann Boxer.  Depuy   UPPER GASTROINTESTINAL ENDOSCOPY  05/16/2010   normal   UTERINE SUSPENSION     VIDEO BRONCHOSCOPY Bilateral 12/18/2014   Procedure: VIDEO BRONCHOSCOPY WITHOUT FLUORO;  Surgeon: Nyoka Cowden, MD;  Location: [facility];  Service: Cardiopulmonary;  Laterality: Bilateral;   Past Surgical History:  Procedure Laterality Date   ABDOMINAL HYSTERECTOMY  1994   APPENDECTOMY  1964   BLADDER SUSPENSION     CESAREAN SECTION     x 3   CHOLECYSTECTOMY  1995   COLONOSCOPY  2017   COLONOSCOPY W/ BIOPSIES AND POLYPECTOMY  01/24/2011   (Crohn's)ileitis, internal hemorrhoids   ECTOPIC PREGNANCY SURGERY     ESOPHAGOGASTRODUODENOSCOPY     FACIAL COSMETIC SURGERY     HAND SURGERY Bilateral 1991   x 2   KNEE SURGERY Right 1981   LYSIS OF ADHESION  2010   ex lap/LOA for SBO Dr Johna Sheriff   ORBITAL FRACTURE SURGERY  1990   RIGHT COLECTOMY  03/2009   Right  colectomy for colon CA.  Dr Dwain Sarna   TONSILLECTOMY  1952   TOTAL KNEE ARTHROPLASTY  03/2010   Dr Charlann Boxer.  Depuy   UPPER GASTROINTESTINAL ENDOSCOPY  05/16/2010   normal   UTERINE SUSPENSION     VIDEO BRONCHOSCOPY Bilateral 12/18/2014   Procedure: VIDEO BRONCHOSCOPY WITHOUT FLUORO;  Surgeon: Nyoka Cowden, MD;  Location: [facility];  Service: Cardiopulmonary;  Laterality: Bilateral;   Past Medical History:  Diagnosis Date   Acute duodenitis 04/24/2017   Allergy    ANEMIA    Anxiety    BACK PAIN, LUMBAR    Cancer of ascending colon pTispN0 s/p colectomy 03/05/2009 02/18/2010   Qualifier: Diagnosis of  By: Leone Payor MD, Alfonse Ras E    CHF (congestive heart failure) (HCC)    Complete rotator cuff tear of left shoulder 11/27/2013   Ultrasound guided injection on November 27, 2013    COPD (chronic obstructive pulmonary disease) with emphysema (HCC)    COPD exacerbation (HCC) 11/14/2017   Crohn's 02/2010   ileal ulcers, intol of entercort--refuses treatment   Emphysema of lung (HCC)    GERD    History of blood transfusion    w/ c/s surgery   History of transfusion of whole blood    HYPERLIPIDEMIA    HYPERTENSION    Microscopic hematuria    chronic   Myocardial infarction (HCC) 2010   Narcotic dependence (HCC) 05/25/2017   Obstruction of intestine or colon (HCC)    adhesions   OSTEOARTHRITIS    Rectal fissure    Possible fissure   Rotator cuff tear    Takotsubo syndrome 12/2008   URINARY INCONTINENCE    Ht 5\' 1"  (1.549 m)   Wt 120 lb (54.4 kg)   LMP  (LMP Unknown)   BMI 22.67 kg/m   Opioid Risk Score:   Fall Risk Score:  `1  Depression screen [facility] 2/9     02/28/2023    9:10 AM 01/19/2023    1:41 PM 11/20/2022    2:10 PM 03/23/2022    9:19 PM 11/14/2021    9:21 AM 11/14/2021    9:17 AM 07/26/2021   10:17 AM  Depression screen PHQ 2/9  Decreased Interest 0 0 0 0 0 0 0  Down, Depressed, Hopeless 0 0 0 0 0 0 0  PHQ - 2 Score 0 0 0 0 0 0 0  Altered sleeping   1    0   Tired, decreased energy   0    0  Change in appetite   0    0  Feeling bad or failure about yourself    0    0  Trouble concentrating   0    0  Moving slowly or fidgety/restless   0    0  Suicidal thoughts   0    0  PHQ-9 Score   1    0  Difficult doing work/chores   Not difficult at all    Not difficult at all      Review of Systems  Musculoskeletal:  Positive for back pain and gait problem.  All other systems reviewed and are negative.     Objective:   Physical Exam PE: Constitution: Appropriate appearance for age. No apparent distress   Resp: No respiratory distress. No accessory muscle usage. on RA Cardio: Well perfused appearance. No peripheral edema. Abdomen: Nondistended. Nontender.   Psych: Appropriate mood and affect. Neuro: AAOx4. No apparent cognitive deficits    Neurologic Exam:   Sensory exam: revealed normal sensation in all dermatomal regions in bilateral upper extremities and bilateral lower extremities  Motor exam: strength 5/5 throughout bilateral upper extremities and bilateral lower extremities Coordination: Fine motor coordination was normal.   Gait: Antalgic, stiff L>R, offloading R leg with knee flexed   MSK: Back/neck No apparent deformity, minimal TTP along cervical paraspinals       Assessment & Plan:   Erica [name] is a 72 y.o. year old female  who  has a past medical history of Acute duodenitis (04/24/2017), Allergy, ANEMIA, Anxiety, BACK PAIN, LUMBAR, Cancer of ascending colon pTispN0 s/p colectomy 03/05/2009 (02/18/2010), CHF (congestive heart failure) (HCC), Complete rotator cuff tear of left shoulder (11/27/2013), COPD (chronic obstructive pulmonary disease) with emphysema (HCC), COPD exacerbation (HCC) (11/14/2017), Crohn's (02/2010), Emphysema of lung (HCC), GERD, History of blood transfusion, History of transfusion of whole blood, HYPERLIPIDEMIA, HYPERTENSION, Microscopic hematuria, Myocardial infarction (HCC) (2010), Narcotic dependence  (HCC) (05/25/2017), Obstruction of intestine or colon (HCC), OSTEOARTHRITIS, Rectal fissure, Rotator cuff tear, Takotsubo syndrome (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   hey are presenting to PM&R clinic for follow up related to  chronic neck and back pain.  .   Chronic pain syndrome Encounter for chronic pain management Encounter for medication management  Follow-up with my nurse practitioner Riley Lam every 3 months and me every 6 months.  If not, we will follow-up more frequently.  Feel free to use MyChart between appointments to discuss any acute issues, making usually get back to within 48 hours.   Norco refilled for 3 months today  Other osteoarthritis involving multiple joints Recommend voltaren gel over the counter up to 4 times daily for right knee pain and swelling  Also recommend use of your compression brace to limit edema  States she cannot get injections due to prior R knee replacement   Cervical disc disorder with radiculopathy of cervical region Lumbar radiculopathy, acute Continue injecitons with Dr. Rubye Oaks  Defers therapy at this time  Agree with use to cane to stabilize gait and offload R knee  Other orders -     HYDROcodone-Acetaminophen; Take 1 tablet by mouth in the morning and at bedtime.  Dispense: 60 tablet; Refill: 0 -     HYDROcodone-Acetaminophen; Take 1 tablet by mouth in the morning and at bedtime.  Dispense: 60 tablet; Refill: 0 -     HYDROcodone-Acetaminophen; Take 1 tablet by mouth in the morning and at bedtime.  Dispense: 60 tablet; Refill: 0

## 2023-02-28 NOTE — Patient Instructions (Signed)
  Chronic pain syndrome Encounter for chronic pain management Encounter for medication management  Follow-up with my nurse practitioner Riley Lam every 3 months and me every 6 months.  If not, we will follow-up more frequently.  Feel free to use MyChart between appointments to discuss any acute issues, making usually get back to within 48 hours.   Norco refilled for 3 months today  Other osteoarthritis involving multiple joints Recommend voltaren gel over the counter up to 4 times daily for right knee pain and swelling  Also recommend use of your conpression brace to limit edema  States she cannot get injections due to prior R knee replacement   Cervical disc disorder with radiculopathy of cervical region Lumbar radiculopathy, acute Continue injecitons with Dr. Rubye Oaks  Defers therapy at this time  Agree with use to cane to stabilize gait and offload R knee

## 2023-03-06 ENCOUNTER — Other Ambulatory Visit: Payer: Self-pay | Admitting: Nurse Practitioner

## 2023-03-07 ENCOUNTER — Encounter: Payer: Self-pay | Admitting: Nurse Practitioner

## 2023-03-07 ENCOUNTER — Ambulatory Visit: Payer: Medicare Other | Admitting: Nurse Practitioner

## 2023-03-07 VITALS — BP 154/80 | HR 71 | Temp 97.7°F | Ht 61.0 in | Wt 120.4 lb

## 2023-03-07 DIAGNOSIS — F064 Anxiety disorder due to known physiological condition: Secondary | ICD-10-CM | POA: Diagnosis not present

## 2023-03-07 DIAGNOSIS — J441 Chronic obstructive pulmonary disease with (acute) exacerbation: Secondary | ICD-10-CM | POA: Diagnosis not present

## 2023-03-07 MED ORDER — ALBUTEROL SULFATE HFA 108 (90 BASE) MCG/ACT IN AERS: 2.0000 | INHALATION_SPRAY | RESPIRATORY_TRACT | 3 refills | Status: DC | PRN

## 2023-03-07 MED ORDER — LEVOFLOXACIN 500 MG PO TABS
500.0000 mg | ORAL_TABLET | Freq: Every day | ORAL | 0 refills | Status: AC
Start: 2023-03-07 — End: 2023-03-14

## 2023-03-07 MED ORDER — METHYLPREDNISOLONE SODIUM SUCC 40 MG IJ SOLR
40.0000 mg | Freq: Once | INTRAMUSCULAR | Status: AC
  Administered 2023-03-07: 40 mg via INTRAMUSCULAR

## 2023-03-07 MED ORDER — PREDNISONE 20 MG PO TABS: 40.0000 mg | ORAL_TABLET | Freq: Every day | ORAL | 0 refills | Status: DC

## 2023-03-07 NOTE — Progress Notes (Signed)
Established Patient Office Visit  Subjective   Patient ID: Erica [name], female    DOB: [birth year]  Age: 72 y.o. MRN: [id number]  Chief Complaint  Patient presents with   COPD    Follow up   HPI:  Discussed the use of AI scribe software for clinical note transcription with the patient, who gave verbal consent to proceed.  History of Present Illness   Erica [name], a patient with a history of emphysema, COPD, and pneumonia, presents with ongoing respiratory symptoms, particularly in the mornings. She describes her symptoms as a struggle, and notes that this is a recurring issue, particularly during the winter months. She has been using a nebulizer treatment, which she finds helpful, but has had adverse reactions to certain inhalers and medications, including a Trelegy inhaler which caused soreness and discomfort. She also mentions a high blood pressure, which she believes may be stress-related. She has a history of smoking, but has significantly cut down over the years. She is experiencing shortness of breath with exertion, wheezing, and coughing.        ROS See pertinent positives and negatives per HPI.    Objective:     BP (!) 154/80 (BP Location: Left Arm, Cuff Size: Normal)   Pulse 71   Temp 97.7 F (36.5 C) (Oral)   Ht 5\' 1"  (1.549 m)   Wt 120 lb 6.4 oz (54.6 kg)   LMP  (LMP Unknown)   SpO2 95%   BMI 22.75 kg/m  BP Readings from Last 3 Encounters:  03/07/23 (!) 154/80  02/28/23 (!) 146/82  02/06/23 108/80   Wt Readings from Last 3 Encounters:  03/07/23 120 lb 6.4 oz (54.6 kg)  02/28/23 122 lb (55.3 kg)  02/06/23 119 lb 12.8 oz (54.3 kg)      Physical Exam Vitals and nursing note reviewed.  Constitutional:      General: She is not in acute distress.    Appearance: Normal appearance.  HENT:     Head: Normocephalic.  Eyes:     Conjunctiva/sclera: Conjunctivae normal.  Cardiovascular:     Rate and Rhythm: Normal rate and regular rhythm.     Pulses:  Normal pulses.     Heart sounds: Normal heart sounds.  Pulmonary:     Effort: Pulmonary effort is normal.     Breath sounds: Wheezing and rhonchi present.  Musculoskeletal:     Cervical back: Normal range of motion.  Skin:    General: Skin is warm.  Neurological:     General: No focal deficit present.     Mental Status: She is alert and oriented to person, place, and time.  Psychiatric:        Mood and Affect: Mood normal.        Behavior: Behavior normal.        Thought Content: Thought content normal.        Judgment: Judgment normal.    The 10-year ASCVD risk score (Arnett DK, et al., 2019) is: 30.7%    Assessment & Plan:   Problem List Items Addressed This Visit       Respiratory   COPD exacerbation (HCC) - Primary    She exhibits increased shortness of breath and wheezing, expressing a preference for a nebulizer over the Trelegy inhaler due to adverse effects, following completion of a previous course of antibiotics and prednisone. We will administer solumedrol 40mg  IM today and commence oral prednisone tomorrow, 40mg  daily x5 days. Levaquin 500mg  will be prescribed once daily. The  Trelegy inhaler will be discontinued, reverting to the albuterol inhaler as she prefers. Encouraged her to schedule an appointment with pulmonology.       Relevant Medications   albuterol (VENTOLIN HFA) 108 (90 Base) MCG/ACT inhaler   predniSONE (DELTASONE) 20 MG tablet    Return if symptoms worsen or fail to improve.    Gerre Scull, NP

## 2023-03-07 NOTE — Assessment & Plan Note (Signed)
She exhibits increased shortness of breath and wheezing, expressing a preference for a nebulizer over the Trelegy inhaler due to adverse effects, following completion of a previous course of antibiotics and prednisone. We will administer solumedrol 40mg  IM today and commence oral prednisone tomorrow, 40mg  daily x5 days. Levaquin 500mg  will be prescribed once daily. The Trelegy inhaler will be discontinued, reverting to the albuterol inhaler as she prefers. Encouraged her to schedule an appointment with pulmonology.

## 2023-03-07 NOTE — Patient Instructions (Addendum)
It was great to see you!  Start prednisone 2 tablets daily in the morning with food  Keep using your albuterol inhaler as needed.   Start levaquin antibiotic once a day.   Let's follow-up at your next scheduled appointment.   Take care,  Rodman Pickle, NP

## 2023-03-07 NOTE — Addendum Note (Signed)
Addended by: Rodman Pickle A on: 03/07/2023 04:26 PM   Modules accepted: Orders

## 2023-03-08 ENCOUNTER — Ambulatory Visit: Payer: Medicare Other | Attending: Physician Assistant

## 2023-03-08 DIAGNOSIS — I2510 Atherosclerotic heart disease of native coronary artery without angina pectoris: Secondary | ICD-10-CM | POA: Diagnosis not present

## 2023-03-08 DIAGNOSIS — Z720 Tobacco use: Secondary | ICD-10-CM | POA: Diagnosis not present

## 2023-03-08 DIAGNOSIS — I10 Essential (primary) hypertension: Secondary | ICD-10-CM

## 2023-03-08 DIAGNOSIS — E782 Mixed hyperlipidemia: Secondary | ICD-10-CM

## 2023-03-08 DIAGNOSIS — R600 Localized edema: Secondary | ICD-10-CM

## 2023-03-08 DIAGNOSIS — R609 Edema, unspecified: Secondary | ICD-10-CM

## 2023-03-08 DIAGNOSIS — I5032 Chronic diastolic (congestive) heart failure: Secondary | ICD-10-CM

## 2023-03-08 NOTE — Progress Notes (Signed)
[name] [address] [place] [address] Phone: [phone] Subjective:   Erica [name], am serving as a scribe for Dr. Antoine Primas.  I'm seeing this patient by the request  of:  McElwee, [name], NP  CC: right middle finger and pain all over   [id number]  12/12/2022 Patient given injection and tolerated the procedure well, discussed with patient bracing at night.  Do believe that based on multiple different comorbidities that is contributing including potentially cervical radiculopathy as well.  Patient is seeing another provider now for pain management which I think will be beneficial.  Can help with other injections and some of her other chronic pains when needed.  Follow-up with me again in 3 months after reviewing the chart as well as discussing with patient total time 32 minutes.      Update 03/13/2023 Erica [name] is a 72 y.o. female coming in with complaint of R middle finger, triggering. Patient states her for regular appointment. Fingers and toes numb on and off. Has been good since finger injection. Another doctor is working with back pain. Knee pain is always hurting on R side.  Does have a replacement on the right side.  States that this is chronic.  Would not want to have any type of revision done well.  Does wear the brace occasionally.  Feels like her new physical medicine and rehabilitation physician has been significantly helpful.      Past Medical History:  Diagnosis Date   Acute duodenitis 04/24/2017   Allergy    ANEMIA    Anxiety    BACK PAIN, LUMBAR    Cancer of ascending colon pTispN0 s/p colectomy 03/05/2009 02/18/2010   Qualifier: Diagnosis of  By: Leone Payor MD, Alfonse Ras E    CHF (congestive heart failure) (HCC)    Complete rotator cuff tear of left shoulder 11/27/2013   Ultrasound guided injection on November 27, 2013    COPD (chronic obstructive pulmonary disease) with emphysema (HCC)    COPD exacerbation  (HCC) 11/14/2017   Crohn's 02/2010   ileal ulcers, intol of entercort--refuses treatment   Emphysema of lung (HCC)    GERD    History of blood transfusion    w/ c/s surgery   History of transfusion of whole blood    HYPERLIPIDEMIA    HYPERTENSION    Microscopic hematuria    chronic   Myocardial infarction (HCC) 2010   Narcotic dependence (HCC) 05/25/2017   Obstruction of intestine or colon (HCC)    adhesions   OSTEOARTHRITIS    Rectal fissure    Possible fissure   Rotator cuff tear    Takotsubo syndrome 12/2008   URINARY INCONTINENCE    Past Surgical History:  Procedure Laterality Date   ABDOMINAL HYSTERECTOMY  1994   APPENDECTOMY  1964   BLADDER SUSPENSION     CESAREAN SECTION     x 3   CHOLECYSTECTOMY  1995   COLONOSCOPY  2017   COLONOSCOPY W/ BIOPSIES AND POLYPECTOMY  01/24/2011   (Crohn's)ileitis, internal hemorrhoids   ECTOPIC PREGNANCY SURGERY     ESOPHAGOGASTRODUODENOSCOPY     FACIAL COSMETIC SURGERY     HAND SURGERY Bilateral 1991   x 2   KNEE SURGERY Right 1981   LYSIS OF ADHESION  2010   ex lap/LOA for SBO Dr Johna Sheriff   ORBITAL FRACTURE SURGERY  1990   RIGHT COLECTOMY  03/2009   Right colectomy for colon CA.  Dr Dwain Sarna  TONSILLECTOMY  1952   TOTAL KNEE ARTHROPLASTY  03/2010   Dr Charlann Boxer.  Depuy   UPPER GASTROINTESTINAL ENDOSCOPY  05/16/2010   normal   UTERINE SUSPENSION     VIDEO BRONCHOSCOPY Bilateral 12/18/2014   Procedure: VIDEO BRONCHOSCOPY WITHOUT FLUORO;  Surgeon: Nyoka Cowden, MD;  Location: [facility];  Service: Cardiopulmonary;  Laterality: Bilateral;   Social History   Socioeconomic History   Marital status: Widowed    Spouse name: Not on file   Number of children: 5   Years of education: 14   Highest education level: High school graduate  Occupational History   Occupation: Retired     Associate Professor: UNEMPLOYED  Tobacco Use   Smoking status: Every Day    Current packs/day: 1.00    Average packs/day: 1 pack/day for 53.0 years (53.0  ttl pk-yrs)    Types: Cigarettes    Passive exposure: Never   Smokeless tobacco: Never   Tobacco comments:    Smokes 0.5 packs of cigarettes daily ARJ 08/22/22  Vaping Use   Vaping status: Never Used  Substance and Sexual Activity   Alcohol use: Yes    Comment: occasional mixed drink   Drug use: Yes    Types: Hydrocodone   Sexual activity: Not Currently    Birth control/protection: Surgical    Comment: Hysterectomy  Other Topics Concern   Not on file  Social History Narrative   Patient is a widow.   Lives in an Apt. 1 son lives in [place] the other 4 adult children live in [place].   Continues to smoke daily, occasional alcohol no drug use   Social Determinants of Health   Financial Resource Strain: Low Risk  (01/19/2023)   Overall Financial Resource Strain (CARDIA)    Difficulty of Paying Living Expenses: Not hard at all  Food Insecurity: No Food Insecurity (01/19/2023)   Hunger Vital Sign    Worried About Running Out of Food in the Last Year: Never true    Ran Out of Food in the Last Year: Never true  Transportation Needs: No Transportation Needs (01/19/2023)   PRAPARE - Administrator, Civil Service (Medical): No    Lack of Transportation (Non-Medical): No  Physical Activity: Inactive (01/19/2023)   Exercise Vital Sign    Days of Exercise per Week: 0 days    Minutes of Exercise per Session: 0 min  Stress: No Stress Concern Present (01/19/2023)   [employer] of Occupational Health - Occupational Stress Questionnaire    Feeling of Stress : Not at all  Social Connections: Moderately Integrated (01/19/2023)   Social Connection and Isolation Panel [NHANES]    Frequency of Communication with Friends and Family: More than three times a week    Frequency of Social Gatherings with Friends and Family: More than three times a week    Attends Religious Services: More than 4 times per year    Active Member of [employer] or Organizations: Yes    Attends Tax inspector Meetings: More than 4 times per year    Marital Status: Widowed   Allergies  Allergen Reactions   Iodinated Contrast Media Itching and Other (See Comments)    She had some itching after lumbar epidural injection in ipsilateral lower extremity 12/07/22.  Today, she had itching in her ipsilateral upper extremity after a cervical epidural injection.(12/21/2022)   Morphine Nausea And Vomiting   Family History  Problem Relation Age of Onset   Throat cancer Mother    Colon cancer  Father 74   Hypertension Father    Heart disease Father    Kidney disease Father    Colon cancer Sister 62   Liver cancer Sister    Other Sister        amyloidosis   Arthritis Other        Parent, other relative   Stomach cancer Neg Hx    Rectal cancer Neg Hx    Breast cancer Neg Hx     Current Outpatient Medications (Endocrine & Metabolic):    predniSONE (DELTASONE) 20 MG tablet, Take 2 tablets (40 mg total) by mouth daily with breakfast.  Current Outpatient Medications (Cardiovascular):    furosemide (LASIX) 20 MG tablet, Take 1 tablet (20 mg total) by mouth daily as needed. (Patient not taking: Reported on 03/07/2023)   metoprolol tartrate (LOPRESSOR) 25 MG tablet, TAKE 1 TABLET BY MOUTH TWICE DAILY . APPOINTMENT REQUIRED FOR FUTURE REFILLS   rosuvastatin (CRESTOR) 10 MG tablet, Take 1 tablet (10 mg total) by mouth every Monday, Wednesday, and Friday.  Current Outpatient Medications (Respiratory):    albuterol (PROVENTIL) (2.5 MG/3ML) 0.083% nebulizer solution, Take 3 mLs (2.5 mg total) by nebulization every 6 (six) hours as needed for wheezing or shortness of breath.   albuterol (VENTOLIN HFA) 108 (90 Base) MCG/ACT inhaler, INHALE 2 PUFFS BY MOUTH EVERY 6 HOURS AS NEEDED FOR WHEEZING FOR SHORTNESS OF BREATH   albuterol (VENTOLIN HFA) 108 (90 Base) MCG/ACT inhaler, Inhale 2 puffs into the lungs every 4 (four) hours as needed for wheezing or shortness of breath.   fluticasone (FLONASE) 50 MCG/ACT  nasal spray, Place 2 sprays into both nostrils daily. (Patient taking differently: Place 2 sprays into both nostrils daily as needed for allergies or rhinitis.)  Current Outpatient Medications (Analgesics):    aspirin EC 81 MG tablet, Take 81 mg by mouth at bedtime.   HYDROcodone-acetaminophen (NORCO) 5-325 MG tablet, Take 1 tablet by mouth in the morning and at bedtime.   [START ON 03/30/2023] HYDROcodone-acetaminophen (NORCO) 5-325 MG tablet, Take 1 tablet by mouth in the morning and at bedtime.   [START ON 04/28/2023] HYDROcodone-acetaminophen (NORCO) 5-325 MG tablet, Take 1 tablet by mouth in the morning and at bedtime.   Current Outpatient Medications (Other):    cyclobenzaprine (FLEXERIL) 10 MG tablet, Take 1 tablet by mouth three times daily as needed for muscle spasm (Patient taking differently: Take 10 mg by mouth as needed. for muscle spams)   hydrOXYzine (ATARAX) 10 MG tablet, TAKE 1 TABLET BY MOUTH THREE TIMES DAILY AS NEEDED FOR ANXIETY   levofloxacin (LEVAQUIN) 500 MG tablet, Take 1 tablet (500 mg total) by mouth daily for 7 days.   Vitamin D, Ergocalciferol, (DRISDOL) 1.25 MG (50000 UNIT) CAPS capsule, Take 1 capsule (50,000 Units total) by mouth once a week.   Reviewed prior external information including notes and imaging from  primary care provider As well as notes that were available from care everywhere and other healthcare systems.  Past medical history, social, surgical and family history all reviewed in electronic medical record.  No pertanent information unless stated regarding to the chief complaint.   Review of Systems:  No headache, visual changes, nausea, vomiting, diarrhea, constipation, dizziness, abdominal pain, skin rash, fevers, chills, night sweats, weight loss, swollen lymph nodes, joint swelling, chest pain, , mood changes. POSITIVE muscle aches, mild shortness of breath, body aches  Objective  Blood pressure (!) 134/90, pulse 76, height 5\' 1"  (1.549 m),  weight 121 lb (54.9 kg), SpO2 95%.  General: No apparent distress alert and oriented x3 mood and affect normal, dressed appropriately.  HEENT: Pupils equal, extraocular movements intact  Respiratory: Patient's speak in full sentences and does not appear short of breath  Cardiovascular: No lower extremity edema, non tender, no erythema  Hand exam shows numbness noted.  Loss of lordosis of the neck.  Patient does have tenderness to palpation all over. Patient does have some numbness noted of the hands.  Relatively symmetric grip strength are noted.     Impression and Recommendations:     The above documentation has been reviewed and is accurate and complete Judi Saa, DO

## 2023-03-09 LAB — LIPID PANEL
Chol/HDL Ratio: 2.9 {ratio} (ref 0.0–4.4)
Cholesterol, Total: 176 mg/dL (ref 100–199)
HDL: 61 mg/dL (ref >39)
LDL Chol Calc (NIH): 94 mg/dL (ref 0–99)
Triglycerides: 119 mg/dL (ref 0–149)
VLDL Cholesterol Cal: 21 mg/dL (ref 5–40)

## 2023-03-09 LAB — COMPREHENSIVE METABOLIC PANEL
ALT: 12 [IU]/L (ref 0–32)
AST: 18 [IU]/L (ref 0–40)
Albumin: 4.6 g/dL (ref 3.8–4.8)
Alkaline Phosphatase: 63 [IU]/L (ref 44–121)
BUN/Creatinine Ratio: 14 (ref 12–28)
BUN: 10 mg/dL (ref 8–27)
Bilirubin Total: 0.2 mg/dL (ref 0.0–1.2)
CO2: 29 mmol/L (ref 20–29)
Calcium: 10 mg/dL (ref 8.7–10.3)
Chloride: 104 mmol/L (ref 96–106)
Creatinine, Ser: 0.74 mg/dL (ref 0.57–1.00)
Globulin, Total: 1.9 g/dL (ref 1.5–4.5)
Glucose: 91 mg/dL (ref 70–99)
Potassium: 4.1 mmol/L (ref 3.5–5.2)
Sodium: 143 mmol/L (ref 134–144)
Total Protein: 6.5 g/dL (ref 6.0–8.5)
eGFR: 86 mL/min/{1.73_m2} (ref >59)

## 2023-03-12 NOTE — Progress Notes (Signed)
Pt has been made aware of normal result and verbalized understanding.  jw

## 2023-03-13 ENCOUNTER — Encounter: Payer: Self-pay | Admitting: Family Medicine

## 2023-03-13 ENCOUNTER — Ambulatory Visit: Payer: Medicare Other | Admitting: Family Medicine

## 2023-03-13 VITALS — BP 134/90 | HR 76 | Ht 61.0 in | Wt 121.0 lb

## 2023-03-13 DIAGNOSIS — M5010 Cervical disc disorder with radiculopathy, unspecified cervical region: Secondary | ICD-10-CM | POA: Diagnosis not present

## 2023-03-13 DIAGNOSIS — G8929 Other chronic pain: Secondary | ICD-10-CM | POA: Diagnosis not present

## 2023-03-13 DIAGNOSIS — Z96651 Presence of right artificial knee joint: Secondary | ICD-10-CM | POA: Diagnosis not present

## 2023-03-13 DIAGNOSIS — M25561 Pain in right knee: Secondary | ICD-10-CM | POA: Diagnosis not present

## 2023-03-13 MED ORDER — METHYLPREDNISOLONE ACETATE 40 MG/ML IJ SUSP
40.0000 mg | Freq: Once | INTRAMUSCULAR | Status: AC
  Administered 2023-03-13: 40 mg via INTRAMUSCULAR

## 2023-03-13 MED ORDER — KETOROLAC TROMETHAMINE 30 MG/ML IJ SOLN
30.0000 mg | Freq: Once | INTRAMUSCULAR | Status: AC
  Administered 2023-03-13: 30 mg via INTRAMUSCULAR

## 2023-03-13 NOTE — Assessment & Plan Note (Addendum)
Will check with patient's PMNR doctor to see if she is a potential candidate for genicular block.  Patient will follow-up with them as well.  Will send my note there to see if they would be willing to see her for this problem.  Discussed icing regimen and home exercises.  Increase activity slowly otherwise.  Follow-up with me again in 3 to 6 months

## 2023-03-13 NOTE — Assessment & Plan Note (Addendum)
Arthritic changes noted.  Discussed which activities to do and which ones to avoid.  Increase activity slowly as well.  Follow-up with me again in 6 to 8 weeks.  Follow-up again in 6 to 8 weeks does respond well to Toradol and Depo-Medrol and given injections today.  Total time reviewing patient's other notes discussed with patient not including the Toradol and Depo-Medrol 31 minutes

## 2023-03-13 NOTE — Addendum Note (Signed)
Addended by: Debbe Odea R on: 03/13/2023 11:53 AM   Modules accepted: Orders

## 2023-03-13 NOTE — Patient Instructions (Addendum)
Cocktail injection [facility]a [name] Rep [phone] Erica [name] See you again in 3-6 months

## 2023-03-14 DIAGNOSIS — F064 Anxiety disorder due to known physiological condition: Secondary | ICD-10-CM | POA: Diagnosis not present

## 2023-03-21 DIAGNOSIS — F064 Anxiety disorder due to known physiological condition: Secondary | ICD-10-CM | POA: Diagnosis not present

## 2023-03-23 ENCOUNTER — Ambulatory Visit: Payer: Medicare Other | Admitting: Internal Medicine

## 2023-04-02 DIAGNOSIS — F064 Anxiety disorder due to known physiological condition: Secondary | ICD-10-CM | POA: Diagnosis not present

## 2023-04-11 DIAGNOSIS — F064 Anxiety disorder due to known physiological condition: Secondary | ICD-10-CM | POA: Diagnosis not present

## 2023-04-18 DIAGNOSIS — F064 Anxiety disorder due to known physiological condition: Secondary | ICD-10-CM | POA: Diagnosis not present

## 2023-04-25 DIAGNOSIS — F064 Anxiety disorder due to known physiological condition: Secondary | ICD-10-CM | POA: Diagnosis not present

## 2023-05-02 DIAGNOSIS — F064 Anxiety disorder due to known physiological condition: Secondary | ICD-10-CM | POA: Diagnosis not present

## 2023-05-08 ENCOUNTER — Ambulatory Visit (HOSPITAL_COMMUNITY): Payer: Medicare Other | Attending: Physician Assistant

## 2023-05-08 DIAGNOSIS — I5032 Chronic diastolic (congestive) heart failure: Secondary | ICD-10-CM | POA: Insufficient documentation

## 2023-05-08 DIAGNOSIS — R600 Localized edema: Secondary | ICD-10-CM | POA: Insufficient documentation

## 2023-05-08 DIAGNOSIS — R609 Edema, unspecified: Secondary | ICD-10-CM | POA: Diagnosis not present

## 2023-05-08 DIAGNOSIS — Z720 Tobacco use: Secondary | ICD-10-CM | POA: Insufficient documentation

## 2023-05-08 DIAGNOSIS — I10 Essential (primary) hypertension: Secondary | ICD-10-CM | POA: Diagnosis not present

## 2023-05-08 DIAGNOSIS — I2510 Atherosclerotic heart disease of native coronary artery without angina pectoris: Secondary | ICD-10-CM | POA: Insufficient documentation

## 2023-05-08 DIAGNOSIS — E782 Mixed hyperlipidemia: Secondary | ICD-10-CM | POA: Insufficient documentation

## 2023-05-08 LAB — ECHOCARDIOGRAM COMPLETE
Area-P 1/2: 3.99 cm2
P 1/2 time: 402 ms
S' Lateral: 2.7 cm

## 2023-05-09 DIAGNOSIS — F064 Anxiety disorder due to known physiological condition: Secondary | ICD-10-CM | POA: Diagnosis not present

## 2023-05-09 NOTE — Progress Notes (Signed)
 Pt has been made aware of normal result and verbalized understanding.  jw

## 2023-05-12 ENCOUNTER — Other Ambulatory Visit: Payer: Self-pay | Admitting: Nurse Practitioner

## 2023-05-15 NOTE — Telephone Encounter (Signed)
Requesting: Vitamin D (Ergocalciferol) 1.25 MG (50000 UT) Oral Capsule  Last Visit: 03/07/2023 Next Visit: 06/25/2023 Last Refill: 02/06/23  Please Advise

## 2023-05-16 DIAGNOSIS — F064 Anxiety disorder due to known physiological condition: Secondary | ICD-10-CM | POA: Diagnosis not present

## 2023-05-23 DIAGNOSIS — F064 Anxiety disorder due to known physiological condition: Secondary | ICD-10-CM | POA: Diagnosis not present

## 2023-05-28 ENCOUNTER — Encounter: Payer: Medicare Other | Attending: Physical Medicine and Rehabilitation | Admitting: Registered Nurse

## 2023-05-28 ENCOUNTER — Encounter: Payer: Self-pay | Admitting: Registered Nurse

## 2023-05-28 VITALS — BP 139/88 | HR 76 | Ht 61.0 in | Wt 111.8 lb

## 2023-05-28 DIAGNOSIS — G8929 Other chronic pain: Secondary | ICD-10-CM | POA: Insufficient documentation

## 2023-05-28 DIAGNOSIS — Z79899 Other long term (current) drug therapy: Secondary | ICD-10-CM | POA: Insufficient documentation

## 2023-05-28 DIAGNOSIS — I10 Essential (primary) hypertension: Secondary | ICD-10-CM | POA: Diagnosis not present

## 2023-05-28 DIAGNOSIS — G894 Chronic pain syndrome: Secondary | ICD-10-CM | POA: Diagnosis not present

## 2023-05-28 DIAGNOSIS — M5416 Radiculopathy, lumbar region: Secondary | ICD-10-CM | POA: Diagnosis not present

## 2023-05-28 MED ORDER — HYDROCODONE-ACETAMINOPHEN 5-325 MG PO TABS: 1.0000 | ORAL_TABLET | Freq: Two times a day (BID) | ORAL | 0 refills | Status: AC

## 2023-05-28 MED ORDER — HYDROCODONE-ACETAMINOPHEN 5-325 MG PO TABS: 1.0000 | ORAL_TABLET | Freq: Two times a day (BID) | ORAL | 0 refills | Status: DC

## 2023-05-28 NOTE — Progress Notes (Signed)
 Subjective:    Patient ID: Erica [name], female    DOB: 23-Jan-1951, 73 y.o.   MRN: [id number]  HPI: Erica [name] is a 73 y.o. female who returns for follow up appointment for chronic pain and medication refill. She states her pain is located in her mid- lower back radiating into her bilateral hips. She rates her pain 8. Her current exercise regime is walking and performing stretching exercises.  [name] Morphine equivalent is 10.00 MME.   UDS ordered today.     Pain Inventory Average Pain 8 Pain Right Now 8 My pain is constant and sharp  In the last 24 hours, has pain interfered with the following? General activity 0 Relation with others 0 Enjoyment of life 0 What TIME of day is your pain at its worst? varies Sleep (in general) Poor  Pain is worse with: walking and bending Pain improves with: medication Relief from Meds: 8  Family History  Problem Relation Age of Onset   Throat cancer Mother    Colon cancer Father 106   Hypertension Father    Heart disease Father    Kidney disease Father    Colon cancer Sister 36   Liver cancer Sister    Other Sister        amyloidosis   Arthritis Other        Parent, other relative   Stomach cancer Neg Hx    Rectal cancer Neg Hx    Breast cancer Neg Hx    Social History   Socioeconomic History   Marital status: Widowed    Spouse name: Not on file   Number of children: 5   Years of education: 14   Highest education level: High school graduate  Occupational History   Occupation: Retired     Associate Professor: UNEMPLOYED  Tobacco Use   Smoking status: Every Day    Current packs/day: 1.00    Average packs/day: 1 pack/day for 53.0 years (53.0 ttl pk-yrs)    Types: Cigarettes    Passive exposure: Never   Smokeless tobacco: Never   Tobacco comments:    Smokes 0.5 packs of cigarettes daily ARJ 08/22/22  Vaping Use   Vaping status: Never Used  Substance and Sexual Activity   Alcohol use: Yes    Comment: occasional mixed  drink   Drug use: Yes    Types: Hydrocodone   Sexual activity: Not Currently    Birth control/protection: Surgical    Comment: Hysterectomy  Other Topics Concern   Not on file  Social History Narrative   Patient is a widow.   Lives in an Apt. 1 son lives in [place] the other 4 adult children live in [place].   Continues to smoke daily, occasional alcohol no drug use   Social Drivers of Corporate investment banker Strain: Low Risk  (01/19/2023)   Overall Financial Resource Strain (CARDIA)    Difficulty of Paying Living Expenses: Not hard at all  Food Insecurity: No Food Insecurity (01/19/2023)   Hunger Vital Sign    Worried About Running Out of Food in the Last Year: Never true    Ran Out of Food in the Last Year: Never true  Transportation Needs: No Transportation Needs (01/19/2023)   PRAPARE - Administrator, Civil Service (Medical): No    Lack of Transportation (Non-Medical): No  Physical Activity: Inactive (01/19/2023)   Exercise Vital Sign    Days of Exercise per Week: 0 days    Minutes of Exercise  per Session: 0 min  Stress: No Stress Concern Present (01/19/2023)   [employer] of Occupational Health - Occupational Stress Questionnaire    Feeling of Stress : Not at all  Social Connections: Moderately Integrated (01/19/2023)   Social Connection and Isolation Panel [NHANES]    Frequency of Communication with Friends and Family: More than three times a week    Frequency of Social Gatherings with Friends and Family: More than three times a week    Attends Religious Services: More than 4 times per year    Active Member of [employer] or Organizations: Yes    Attends Banker Meetings: More than 4 times per year    Marital Status: Widowed   Past Surgical History:  Procedure Laterality Date   ABDOMINAL HYSTERECTOMY  1994   APPENDECTOMY  1964   BLADDER SUSPENSION     CESAREAN SECTION     x 3   CHOLECYSTECTOMY  1995   COLONOSCOPY  2017    COLONOSCOPY W/ BIOPSIES AND POLYPECTOMY  01/24/2011   (Crohn's)ileitis, internal hemorrhoids   ECTOPIC PREGNANCY SURGERY     ESOPHAGOGASTRODUODENOSCOPY     FACIAL COSMETIC SURGERY     HAND SURGERY Bilateral 1991   x 2   KNEE SURGERY Right 1981   LYSIS OF ADHESION  2010   ex lap/LOA for SBO Dr Johna Sheriff   ORBITAL FRACTURE SURGERY  1990   RIGHT COLECTOMY  03/2009   Right colectomy for colon CA.  Dr Dwain Sarna   TONSILLECTOMY  1952   TOTAL KNEE ARTHROPLASTY  03/2010   Dr Charlann Boxer.  Depuy   UPPER GASTROINTESTINAL ENDOSCOPY  05/16/2010   normal   UTERINE SUSPENSION     VIDEO BRONCHOSCOPY Bilateral 12/18/2014   Procedure: VIDEO BRONCHOSCOPY WITHOUT FLUORO;  Surgeon: Nyoka Cowden, MD;  Location: [facility];  Service: Cardiopulmonary;  Laterality: Bilateral;   Past Surgical History:  Procedure Laterality Date   ABDOMINAL HYSTERECTOMY  1994   APPENDECTOMY  1964   BLADDER SUSPENSION     CESAREAN SECTION     x 3   CHOLECYSTECTOMY  1995   COLONOSCOPY  2017   COLONOSCOPY W/ BIOPSIES AND POLYPECTOMY  01/24/2011   (Crohn's)ileitis, internal hemorrhoids   ECTOPIC PREGNANCY SURGERY     ESOPHAGOGASTRODUODENOSCOPY     FACIAL COSMETIC SURGERY     HAND SURGERY Bilateral 1991   x 2   KNEE SURGERY Right 1981   LYSIS OF ADHESION  2010   ex lap/LOA for SBO Dr Johna Sheriff   ORBITAL FRACTURE SURGERY  1990   RIGHT COLECTOMY  03/2009   Right colectomy for colon CA.  Dr Dwain Sarna   TONSILLECTOMY  1952   TOTAL KNEE ARTHROPLASTY  03/2010   Dr Charlann Boxer.  Depuy   UPPER GASTROINTESTINAL ENDOSCOPY  05/16/2010   normal   UTERINE SUSPENSION     VIDEO BRONCHOSCOPY Bilateral 12/18/2014   Procedure: VIDEO BRONCHOSCOPY WITHOUT FLUORO;  Surgeon: Nyoka Cowden, MD;  Location: [facility];  Service: Cardiopulmonary;  Laterality: Bilateral;   Past Medical History:  Diagnosis Date   Acute duodenitis 04/24/2017   Allergy    ANEMIA    Anxiety    BACK PAIN, LUMBAR    Cancer of ascending colon pTispN0 s/p colectomy  03/05/2009 02/18/2010   Qualifier: Diagnosis of  By: Leone Payor MD, Charlyne Quale    CHF (congestive heart failure) (HCC)    Complete rotator cuff tear of left shoulder 11/27/2013   Ultrasound guided injection on November 27, 2013  COPD (chronic obstructive pulmonary disease) with emphysema (HCC)    COPD exacerbation (HCC) 11/14/2017   Crohn's 02/2010   ileal ulcers, intol of entercort--refuses treatment   Emphysema of lung (HCC)    GERD    History of blood transfusion    w/ c/s surgery   History of transfusion of whole blood    HYPERLIPIDEMIA    HYPERTENSION    Microscopic hematuria    chronic   Myocardial infarction (HCC) 2010   Narcotic dependence (HCC) 05/25/2017   Obstruction of intestine or colon (HCC)    adhesions   OSTEOARTHRITIS    Rectal fissure    Possible fissure   Rotator cuff tear    Takotsubo syndrome 12/2008   URINARY INCONTINENCE    BP (!) 165/90   Pulse 80   Ht 5\' 1"  (1.549 m)   Wt 111 lb 12.8 oz (50.7 kg)   LMP  (LMP Unknown)   SpO2 97%   BMI 21.12 kg/m   Opioid Risk Score:   Fall Risk Score:  `1  Depression screen [facility] 2/9     05/28/2023    9:11 AM 02/28/2023    9:10 AM 01/19/2023    1:41 PM 11/20/2022    2:10 PM 03/23/2022    9:19 PM 11/14/2021    9:21 AM 11/14/2021    9:17 AM  Depression screen PHQ 2/9  Decreased Interest 0 0 0 0 0 0 0  Down, Depressed, Hopeless 0 0 0 0 0 0 0  PHQ - 2 Score 0 0 0 0 0 0 0  Altered sleeping    1     Tired, decreased energy    0     Change in appetite    0     Feeling bad or failure about yourself     0     Trouble concentrating    0     Moving slowly or fidgety/restless    0     Suicidal thoughts    0     PHQ-9 Score    1     Difficult doing work/chores    Not difficult at all        Review of Systems  Musculoskeletal:  Positive for back pain.  All other systems reviewed and are negative.      Objective:   Physical Exam Vitals and nursing note reviewed.  Constitutional:      Appearance: Normal  appearance.  Cardiovascular:     Rate and Rhythm: Normal rate and regular rhythm.     Pulses: Normal pulses.     Heart sounds: Normal heart sounds.  Pulmonary:     Effort: Pulmonary effort is normal.     Breath sounds: Normal breath sounds.  Musculoskeletal:     Comments: Normal Muscle Bulk and Muscle Testing Reveals:  Upper Extremities: Full ROM and Muscle Strength 5/5  Lumbar Paraspinal Tenderness: L-4- L-5  Lower Extremities: Right: Decreased ROM and Muscle Strength 5/5 Right Lower Extremity Flexion Produces Pain into her Right Patella Left Lower Extremity: Full ROM and Muscle Strength 5/5 Arises From the Table slowly Narrow Based  Gait     Skin:    General: Skin is warm and dry.  Neurological:     Mental Status: She is alert and oriented to person, place, and time.  Psychiatric:        Mood and Affect: Mood normal.        Behavior: Behavior normal.  Assessment & Plan:  Lumbar Radiculitis: Per Dr Shearon Stalls note: Ms. Skeens failed Gabapentin. Continue Current medication regimen. Continue to Monitor.  Essential Hypertension. Blood Pressure was re-checked. Ms. Vint reports she is compliant with her anti-hypertensive medication.PCP following. Continue to Monitor.  Chronic Pain Syndrome. Refilled: Hydrocodone 5/325 mg one tablet twice a day as needed for pain #60.  Following Months Prescriptions sent to pharmacy, to accommodate scheduled appointment. We will continue the opioid monitoring program, this consists of regular clinic visits, examinations, urine drug screen, pill counts as well as use of [place] Controlled Substance Reporting system. A 12 month History has been reviewed on the [place] Controlled Substance Reporting System on 05/28/2023  F/U in 3 months.

## 2023-05-30 DIAGNOSIS — F064 Anxiety disorder due to known physiological condition: Secondary | ICD-10-CM | POA: Diagnosis not present

## 2023-05-31 LAB — TOXASSURE SELECT,+ANTIDEPR,UR

## 2023-06-05 ENCOUNTER — Ambulatory Visit: Payer: Medicare Other | Attending: Internal Medicine | Admitting: Internal Medicine

## 2023-06-05 VITALS — BP 140/78 | HR 78 | Ht 60.0 in | Wt 112.4 lb

## 2023-06-05 DIAGNOSIS — I5181 Takotsubo syndrome: Secondary | ICD-10-CM | POA: Insufficient documentation

## 2023-06-05 DIAGNOSIS — E782 Mixed hyperlipidemia: Secondary | ICD-10-CM | POA: Insufficient documentation

## 2023-06-05 DIAGNOSIS — I10 Essential (primary) hypertension: Secondary | ICD-10-CM | POA: Insufficient documentation

## 2023-06-05 NOTE — Progress Notes (Signed)
 Cardiology Office Note:  .    Date:  06/05/2023  ID:  [name], DOB [birth year], MRN [id number] PCP: Gerre Scull, NP  [facility] Providers Cardiologist:  Christell Constant, MD     CC: Transition to new cardiologist  History of Present Illness: .    Discussed the use of AI scribe software for clinical note transcription with the patient, who gave verbal consent to proceed.  Erica [name] is a 73 year old female with stress induced cardiomyopathy, hypertension, and hyperlipidemia who presents for risk factor modification. She was formerly followed by Doctor Katrinka Blazing for management of her cardiac conditions.  She has a history of stress induced cardiomyopathy, with her heart function having fully recovered after the initial diagnosis. The last imaging in 2015 showed normal heart function, and a recent echocardiogram confirmed normal heart function.  Her hypertension was previously well-controlled, but her blood pressure is currently slightly elevated at 140/78. She uses metoprolol for blood pressure management.  She has hyperlipidemia, with her LDL previously near goal at 76 but now increased to 94. She has had issues with statins in the past, taking them only twice a week due to side effects.  She is a former smoker but still smokes occasionally.  She is prone to pneumonia and often requires prednisone or antibiotics when she catches a cold. She experiences a nonproductive cough. No current chest pain or breathing issues.  She has a history of a spinal cord injury and is on medications related to that condition. She also mentions a past right hemicolectomy due to a misdiagnosis of colon cancer, which was later identified as Crohn's disease.  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 She lives in a two-bedroom apartment and manages daily activities, including climbing 19 steps to her home.   Relevant histories: .   Social: [place], former HS patient  ROS: As per HPI.   Studies Reviewed: .   Cardiac Studies & Procedures   ______________________________________________________________________________________________   STRESS TESTS  NM MYOCAR MULTI W/SPECT W 08/16/2015  Narrative  There was no ST segment deviation noted during stress.  No T wave inversion was noted during stress.  The study is normal.  This is a low risk study.  The left ventricular ejection fraction is normal (55-65%).   ECHOCARDIOGRAM  ECHOCARDIOGRAM COMPLETE 05/08/2023  Narrative ECHOCARDIOGRAM REPORT    Patient Name:   Erica [name] Date of Exam: 05/08/2023 Medical Rec #:  [id number]        Height:       61.0 in Accession #:    [id number]       Weight:       121.0 lb Date of Birth:  September 14, 1950       BSA:          1.526 m Patient Age:    72 years         BP:           166/98 mmHg Patient Gender: F                HR:           71 bpm. Exam Location:  [address]  Procedure: 2D Echo, 3D Echo, Cardiac Doppler and Color Doppler  Indications:    I50.32 CHF  History:        Patient has prior history of Echocardiogram examinations, most recent 10/17/2013. Signs/Symptoms:Edema and Shortness of Breath; Risk Factors:Hypertension, HLD and Current  Smoker.  Sonographer:    Clearence Ped RCS Referring Phys: [id number] SCOTT T WEAVER  IMPRESSIONS   1. Left ventricular ejection fraction, by estimation, is 55 to 60%. Left ventricular ejection fraction by 3D volume is 57 %. The left ventricle has normal function. The left ventricle has no regional wall motion abnormalities. Left ventricular diastolic parameters are consistent with Grade I diastolic dysfunction (impaired relaxation). 2. Right ventricular systolic function is normal. The right ventricular size is normal. 3. The mitral valve is grossly normal. Trivial mitral valve regurgitation. No evidence of mitral stenosis. 4. The aortic valve is tricuspid. Aortic valve  regurgitation is trivial. No aortic stenosis is present.  FINDINGS Left Ventricle: Left ventricular ejection fraction, by estimation, is 55 to 60%. Left ventricular ejection fraction by 3D volume is 57 %. The left ventricle has normal function. The left ventricle has no regional wall motion abnormalities. The left ventricular internal cavity size was normal in size. There is no left ventricular hypertrophy. Left ventricular diastolic parameters are consistent with Grade I diastolic dysfunction (impaired relaxation).  Right Ventricle: The right ventricular size is normal. No increase in right ventricular wall thickness. Right ventricular systolic function is normal.  Left Atrium: Left atrial size was normal in size.  Right Atrium: Right atrial size was normal in size.  Pericardium: There is no evidence of pericardial effusion.  Mitral Valve: The mitral valve is grossly normal. Trivial mitral valve regurgitation. No evidence of mitral valve stenosis.  Tricuspid Valve: The tricuspid valve is grossly normal. Tricuspid valve regurgitation is trivial. No evidence of tricuspid stenosis.  Aortic Valve: The aortic valve is tricuspid. Aortic valve regurgitation is trivial. Aortic regurgitation PHT measures 402 msec. No aortic stenosis is present.  Pulmonic Valve: The pulmonic valve was grossly normal. Pulmonic valve regurgitation is not visualized. No evidence of pulmonic stenosis.  Aorta: The aortic root and ascending aorta are structurally normal, with no evidence of dilitation.  Venous: The inferior vena cava was not well visualized.  IAS/Shunts: The atrial septum is grossly normal.   LEFT VENTRICLE PLAX 2D LVIDd:         3.80 cm         Diastology LVIDs:         2.70 cm         LV e' medial:    6.64 cm/s LV PW:         1.00 cm         LV E/e' medial:  10.0 LV IVS:        0.90 cm         LV e' lateral:   6.96 cm/s LVOT diam:     2.00 cm         LV E/e' lateral: 9.6 LV SV:         51 LV  SV Index:   33 LVOT Area:     3.14 cm        3D Volume EF LV 3D EF:    Left ventricul ar ejection fraction by 3D volume is 57 %.  3D Volume EF: 3D EF:        57 % LV EDV:       75 ml LV ESV:       32 ml LV SV:        43 ml  RIGHT VENTRICLE RV S prime:     10.60 cm/s TAPSE (M-mode): 2.3 cm RVSP:           22.2  mmHg  LEFT ATRIUM             Index        RIGHT ATRIUM           Index LA diam:        2.40 cm 1.57 cm/m   RA Pressure: 3.00 mmHg LA Vol (A2C):   22.2 ml 14.55 ml/m  RA Area:     11.10 cm LA Vol (A4C):   28.0 ml 18.35 ml/m  RA Volume:   26.00 ml  17.04 ml/m LA Biplane Vol: 25.1 ml 16.45 ml/m AORTIC VALVE LVOT Vmax:   74.40 cm/s LVOT Vmean:  46.600 cm/s LVOT VTI:    0.162 m AI PHT:      402 msec  AORTA Ao Root diam: 2.80 cm Ao Asc diam:  2.70 cm  MITRAL VALVE               TRICUSPID VALVE MV Area (PHT):             TR Peak grad:   19.2 mmHg MV Decel Time:             TR Vmax:        219.00 cm/s MV E velocity: 66.60 cm/s  Estimated RAP:  3.00 mmHg MV A velocity: 82.80 cm/s  RVSP:           22.2 mmHg MV E/A ratio:  0.80 SHUNTS Systemic VTI:  0.16 m Systemic Diam: 2.00 cm  Lennie Odor MD Electronically signed by Lennie Odor MD Signature Date/Time: 05/08/2023/2:56:02 PM    Final          ______________________________________________________________________________________________       Physical Exam:    VS:  BP (!) 140/78 (BP Location: Right Arm)   Pulse 78   Ht 5' (1.524 m)   Wt 112 lb 6.4 oz (51 kg)   LMP  (LMP Unknown)   SpO2 99%   BMI 21.95 kg/m    Wt Readings from Last 3 Encounters:  06/05/23 112 lb 6.4 oz (51 kg)  05/28/23 111 lb 12.8 oz (50.7 kg)  03/13/23 121 lb (54.9 kg)    Gen: no distress  smells of smoke Neck: No JVD Cardiac: No Rubs or Gallops, no murmur, RRR +2  radial pulses Respiratory: Clear to auscultation bilaterally, normal effort, normal  respiratory rate GI: Soft, nontender, non-distended  MS: No  edema;  moves all extremities Integument: Skin feels warm Neuro:  At time of evaluation, alert and oriented to person/place/time/situation  Psych: Normal affect, patient feels well   ASSESSMENT AND PLAN: .    Stress-Induced Cardiomyopathy (Takotsubo Cardiomyopathy) Full recovery of heart function as of the latest echocardiogram. No current symptoms of chest pain or dyspnea. Echocardiogram shows normal heart function. - Continue current management - Monitor for new symptoms such as chest pain or dyspnea  Hypertension Blood pressure slightly elevated at 140/78, with home measurements around 138. Discussed maintaining BP under 130/80 to reduce cardiovascular, stroke, and dementia risks. Currently on metoprolol; hesitant to add medications due to spinal cord injury and other medications. Discussed lifestyle modifications and potential additional medication if home readings remain elevated. - Monitor blood pressure at home - Consider additional medication if home readings remain elevated (norvasc)  - Encourage lifestyle modifications, including smoking cessation  Hyperlipidemia LDL increased to 94 from previous goal of 76. Discussed managing cholesterol to prevent arterial plaque buildup, evidenced by calcium in aorta and coronary arteries. Issues with statins; currently taking twice a week.  Discussed increasing statin dosage to three times a week and considering non-statin medications if issues persist. - Increase statin dosage to three times a week - Consider non-statin medications if issues persist  General Health Maintenance Discussed smoking cessation and its impact on overall health, including blood pressure and cardiovascular risk. Prone to pneumonia, often requiring prednisone or antibiotics. - Encourage smoking cessation  Follow-up - Follow-up if experiencing chest pain, dyspnea, or leg swelling - Consider additional blood pressure medication if home readings remain elevated. - One  year  Riley Lam, MD FASE [facility] Cardiologist [facility]  [address] [place], [address] [place], [place] [address] [phone]  10:49 AM

## 2023-06-05 NOTE — Addendum Note (Signed)
 Addended by: Macie Burows on: 06/05/2023 11:03 AM   Modules accepted: Orders

## 2023-06-05 NOTE — Patient Instructions (Addendum)
 Medication Instructions:  Your physician has recommended you make the following change in your medication:  TAKE : rosuvastatin (Crestor) 10 my by mouth once daily on Monday, Wednesday and Friday.  *If you need a refill on your cardiac medications before your next appointment, please call your pharmacy*   Lab Work: JUNE: FLP, ALT at [employer]  If you have labs (blood work) drawn today and your tests are completely normal, you will receive your results only by: MyChart Message (if you have MyChart) OR A paper copy in the mail If you have any lab test that is abnormal or we need to change your treatment, we will call you to review the results.   Testing/Procedures: NONE   Follow-Up: At [facility], you and your health needs are our priority.  As part of our continuing mission to provide you with exceptional heart care, we have created designated Provider Care Teams.  These Care Teams include your primary Cardiologist (physician) and Advanced Practice Providers (APPs -  Physician Assistants and Nurse Practitioners) who all work together to provide you with the care you need, when you need it.  We recommend signing up for the patient portal called "MyChart".  Sign up information is provided on this After Visit Summary.  MyChart is used to connect with patients for Virtual Visits (Telemedicine).  Patients are able to view lab/test results, encounter notes, upcoming appointments, etc.  Non-urgent messages can be sent to your provider as well.   To learn more about what you can do with MyChart, go to [email or web address].    Your next appointment:   1 year(s)  Provider:   Christell Constant, MD

## 2023-06-06 DIAGNOSIS — F064 Anxiety disorder due to known physiological condition: Secondary | ICD-10-CM | POA: Diagnosis not present

## 2023-06-11 NOTE — Progress Notes (Addendum)
 [name] [address] [place] [address] Phone: [phone] Subjective:   [name], am serving as a scribe for Erica. Antoine Primas.  I'm seeing this patient by the request  of:  [name], Erica A, NP  CC: right knee pain   [id number]  03/13/2023 Arthritic changes noted.  Discussed which activities to do and which ones to avoid.  Increase activity slowly as well.  Follow-up with me again in 6 to 8 weeks.  Follow-up again in 6 to 8 weeks does respond well to Toradol and Depo-Medrol and given injections today.  Total time reviewing patient's other notes discussed with patient not including the Toradol and Depo-Medrol 31 minutes     Will check with patient's PMNR doctor to see if she is a potential candidate for genicular block.  Patient will follow-up with them as well.  Will send my note there to see if they would be willing to see her for this problem.  Discussed icing regimen and home exercises.  Increase activity slowly otherwise.  Follow-up with me again in 3 to 6 months     Update 06/12/2023 Erica [name] is a 73 y.o. female coming in with complaint of R knee and neck pain. Patient states hand pain and shoulder on right side. Reached in pantry and got a sharp pain. Being very protective of shoulder, doesn't want to move.     Patient was seen by pain management.  Did get refill of hydrocodone, is on a pain contract.  Past Medical History:  Diagnosis Date   Acute duodenitis 04/24/2017   Allergy    ANEMIA    Anxiety    BACK PAIN, LUMBAR    Cancer of ascending colon [id number] s/p colectomy 03/05/2009 02/18/2010   Qualifier: Diagnosis of  By: Erica [name], Erica [name]    CHF (congestive heart failure) (HCC)    Complete rotator cuff tear of left shoulder 11/27/2013   Ultrasound guided injection on November 27, 2013    COPD (chronic obstructive pulmonary disease) with emphysema (HCC)    COPD exacerbation (HCC) 11/14/2017   Crohn's  02/2010   ileal ulcers, intol of entercort--refuses treatment   Emphysema of lung (HCC)    GERD    History of blood transfusion    w/ c/s surgery   History of transfusion of whole blood    HYPERLIPIDEMIA    HYPERTENSION    Microscopic hematuria    chronic   Myocardial infarction (HCC) 2010   Narcotic dependence (HCC) 05/25/2017   Obstruction of intestine or colon (HCC)    adhesions   OSTEOARTHRITIS    Rectal fissure    Possible fissure   Rotator cuff tear    Takotsubo syndrome 12/2008   URINARY INCONTINENCE    Past Surgical History:  Procedure Laterality Date   ABDOMINAL HYSTERECTOMY  1994   APPENDECTOMY  1964   BLADDER SUSPENSION     CESAREAN SECTION     x 3   CHOLECYSTECTOMY  1995   COLONOSCOPY  2017   COLONOSCOPY W/ BIOPSIES AND POLYPECTOMY  01/24/2011   (Crohn's)ileitis, internal hemorrhoids   ECTOPIC PREGNANCY SURGERY     ESOPHAGOGASTRODUODENOSCOPY     FACIAL COSMETIC SURGERY     HAND SURGERY Bilateral 1991   x 2   KNEE SURGERY Right 1981   LYSIS OF ADHESION  2010   ex lap/LOA for SBO Erica [name]   ORBITAL FRACTURE SURGERY  1990   RIGHT COLECTOMY  03/2009  Right colectomy for colon CA.  Erica [name]   TONSILLECTOMY  1952   TOTAL KNEE ARTHROPLASTY  03/2010   Erica [name].  Depuy   UPPER GASTROINTESTINAL ENDOSCOPY  05/16/2010   normal   UTERINE SUSPENSION     VIDEO BRONCHOSCOPY Bilateral 12/18/2014   Procedure: VIDEO BRONCHOSCOPY WITHOUT FLUORO;  Surgeon: Erica Cowden, [name];  Location: [facility];  Service: Cardiopulmonary;  Laterality: Bilateral;   Social History   Socioeconomic History   Marital status: Widowed    Spouse name: Not on file   Number of children: 5   Years of education: 14   Highest education level: High school graduate  Occupational History   Occupation: Retired     Associate Professor: UNEMPLOYED  Tobacco Use   Smoking status: Every Day    Current packs/day: 1.00    Average packs/day: 1 pack/day for 53.0 years (53.0 ttl pk-yrs)    Types:  Cigarettes    Passive exposure: Never   Smokeless tobacco: Never   Tobacco comments:    Smokes 0.5 packs of cigarettes daily ARJ 08/22/22  Vaping Use   Vaping status: Never Used  Substance and Sexual Activity   Alcohol use: Yes    Comment: occasional mixed drink   Drug use: Yes    Types: Hydrocodone   Sexual activity: Not Currently    Birth control/protection: Surgical    Comment: Hysterectomy  Other Topics Concern   Not on file  Social History Narrative   Patient is a widow.   Lives in an Apt. 1 son lives in [place] the other 4 adult children live in [place].   Continues to smoke daily, occasional alcohol no drug use   Social Drivers of Corporate investment banker Strain: Low Risk  (01/19/2023)   Overall Financial Resource Strain (CARDIA)    Difficulty of Paying Living Expenses: Not hard at all  Food Insecurity: No Food Insecurity (01/19/2023)   Hunger Vital Sign    Worried About Running Out of Food in the Last Year: Never true    Ran Out of Food in the Last Year: Never true  Transportation Needs: No Transportation Needs (01/19/2023)   PRAPARE - Administrator, Civil Service (Medical): No    Lack of Transportation (Non-Medical): No  Physical Activity: Inactive (01/19/2023)   Exercise Vital Sign    Days of Exercise per Week: 0 days    Minutes of Exercise per Session: 0 min  Stress: No Stress Concern Present (01/19/2023)   [employer] of Occupational Health - Occupational Stress Questionnaire    Feeling of Stress : Not at all  Social Connections: Moderately Integrated (01/19/2023)   Social Connection and Isolation Panel [NHANES]    Frequency of Communication with Friends and Family: More than three times a week    Frequency of Social Gatherings with Friends and Family: More than three times a week    Attends Religious Services: More than 4 times per year    Active Member of [employer] or Organizations: Yes    Attends Banker Meetings: More  than 4 times per year    Marital Status: Widowed   Allergies  Allergen Reactions   Iodinated Contrast Media Itching and Other (See Comments)    She had some itching after lumbar epidural injection in ipsilateral lower extremity 12/07/22.  Today, she had itching in her ipsilateral upper extremity after a cervical epidural injection.(12/21/2022)   Morphine Nausea And Vomiting   Family History  Problem Relation Age of Onset  Throat cancer Mother    Colon cancer Father 15   Hypertension Father    Heart disease Father    Kidney disease Father    Colon cancer Sister 17   Liver cancer Sister    Other Sister        amyloidosis   Arthritis Other        Parent, other relative   Stomach cancer Neg Hx    Rectal cancer Neg Hx    Breast cancer Neg Hx      Current Outpatient Medications (Cardiovascular):    metoprolol tartrate (LOPRESSOR) 25 MG tablet, TAKE 1 TABLET BY MOUTH TWICE DAILY . APPOINTMENT REQUIRED FOR FUTURE REFILLS   rosuvastatin (CRESTOR) 10 MG tablet, Take 1 tablet (10 mg total) by mouth every Monday, Wednesday, and Friday. (Patient taking differently: Take 10 mg by mouth 2 (two) times a week. Every M-Th)  Current Outpatient Medications (Respiratory):    albuterol (PROVENTIL) (2.5 MG/3ML) 0.083% nebulizer solution, Take 3 mLs (2.5 mg total) by nebulization every 6 (six) hours as needed for wheezing or shortness of breath.   albuterol (VENTOLIN HFA) 108 (90 Base) MCG/ACT inhaler, Inhale 2 puffs into the lungs every 4 (four) hours as needed for wheezing or shortness of breath.   fluticasone (FLONASE) 50 MCG/ACT nasal spray, Place 2 sprays into both nostrils daily. (Patient taking differently: Place 2 sprays into both nostrils daily as needed for allergies or rhinitis.)  Current Outpatient Medications (Analgesics):    aspirin EC 81 MG tablet, Take 81 mg by mouth at bedtime.   HYDROcodone-acetaminophen (NORCO) 5-325 MG tablet, Take 1 tablet by mouth in the morning and at  bedtime.   Current Outpatient Medications (Other):    cyclobenzaprine (FLEXERIL) 10 MG tablet, Take 1 tablet by mouth three times daily as needed for muscle spasm (Patient taking differently: Take 10 mg by mouth as needed. for muscle spams)   hydrOXYzine (ATARAX) 10 MG tablet, TAKE 1 TABLET BY MOUTH THREE TIMES DAILY AS NEEDED FOR ANXIETY   Vitamin D, Ergocalciferol, (DRISDOL) 1.25 MG (50000 UNIT) CAPS capsule, Take 1 capsule by mouth once a week   Reviewed prior external information including notes and imaging from  primary care provider As well as notes that were available from care everywhere and other healthcare systems.  Past medical history, social, surgical and family history all reviewed in electronic medical record.  No pertanent information unless stated regarding to the chief complaint.   Review of Systems:  No headache, visual changes, nausea, vomiting, diarrhea, constipation, dizziness, abdominal pain, skin rash, fevers, chills, night sweats, weight loss, swollen lymph nodes, body aches, joint swelling, chest pain, shortness of breath, mood changes. POSITIVE muscle aches  Objective  Blood pressure (!) 148/96, pulse 79, height 5' (1.524 m), weight 116 lb (52.6 kg), SpO2 95%.   General: No apparent distress alert and oriented x3 mood and affect normal, dressed appropriately.  HEENT: Pupils equal, extraocular movements intact  Respiratory: Patient's speak in full sentences and does not appear short of breath  Cardiovascular: No lower extremity edema, non tender, no erythema  Knee exam shows arthritic changes but not giving her significant pain today.  Right shoulder exam shows a patient does have some crepitus noted.  Patient is holding the shoulder very tight to herself.  Does have involuntary involuntary guarding noted.  Loss of lordosis of the neck noted. Patient is right hand does have trigger finger noted at the middle finger  After informed written and verbal consent,  patient was  seated on exam table. Right shoulder was prepped with alcohol swab and utilizing posterior approach, patient's right glenohumeral space was injected with 4:1  marcaine 0.5%: Kenalog 40mg /dL. Patient tolerated the procedure well without immediate complications.  After verbal consent patient was prepped with alcohol swab and with a 25-gauge half inch needle injected into the right acromioclavicular joint.  Total of 0.5 cc of 0.5% Marcaine and 0.5 cc of Kenalog 40 mg/mL.  Patient given injection and tolerated the procedure well, no blood loss.  Band-Aid placed.  Postinjection instructions given  After verbal consent patient was prepped with alcohol swab and given a 25-gauge needle injected with 0.5 cc of 0.5% Marcaine and 0.5 cc of Kenalog 40 mg/mL.  This was in the right third flexor tendon at the A2 pulley.  No blood loss Band-Aid placed.  Post injection instructions given   Impression and Recommendations:    The above documentation has been reviewed and is accurate and complete Judi Saa, DO

## 2023-06-12 ENCOUNTER — Ambulatory Visit (INDEPENDENT_AMBULATORY_CARE_PROVIDER_SITE_OTHER)

## 2023-06-12 ENCOUNTER — Ambulatory Visit: Payer: Medicare Other | Admitting: Family Medicine

## 2023-06-12 ENCOUNTER — Encounter: Payer: Self-pay | Admitting: Family Medicine

## 2023-06-12 VITALS — BP 148/96 | HR 79 | Ht 60.0 in | Wt 116.0 lb

## 2023-06-12 DIAGNOSIS — M65331 Trigger finger, right middle finger: Secondary | ICD-10-CM | POA: Diagnosis not present

## 2023-06-12 DIAGNOSIS — M19019 Primary osteoarthritis, unspecified shoulder: Secondary | ICD-10-CM | POA: Insufficient documentation

## 2023-06-12 DIAGNOSIS — M12811 Other specific arthropathies, not elsewhere classified, right shoulder: Secondary | ICD-10-CM

## 2023-06-12 DIAGNOSIS — M65341 Trigger finger, right ring finger: Secondary | ICD-10-CM

## 2023-06-12 DIAGNOSIS — M25511 Pain in right shoulder: Secondary | ICD-10-CM | POA: Diagnosis not present

## 2023-06-12 DIAGNOSIS — M19011 Primary osteoarthritis, right shoulder: Secondary | ICD-10-CM | POA: Diagnosis not present

## 2023-06-12 NOTE — Assessment & Plan Note (Signed)
 Patient given injection noted today.  Discussed icing regimen, home exercises, and topical anti-inflammatories.  Hopeful that will make significant improvement.

## 2023-06-12 NOTE — Assessment & Plan Note (Signed)
 Patient given injection and tolerated the procedure well, discussed icing regimen and home exercises.  Increase activity slowly.  Follow-up again in 8 to 12 weeks.  X-rays did not show any acute fracture.

## 2023-06-12 NOTE — Patient Instructions (Addendum)
 Injection in shoulder and finger today Good to see you! See you again in 3-4 weeks for nurse visit (Cocktail) See you again in 6-8 weeks for [facility] appointment

## 2023-06-13 DIAGNOSIS — F064 Anxiety disorder due to known physiological condition: Secondary | ICD-10-CM | POA: Diagnosis not present

## 2023-06-20 DIAGNOSIS — F064 Anxiety disorder due to known physiological condition: Secondary | ICD-10-CM | POA: Diagnosis not present

## 2023-06-25 ENCOUNTER — Ambulatory Visit (INDEPENDENT_AMBULATORY_CARE_PROVIDER_SITE_OTHER): Payer: Medicare Other | Admitting: Nurse Practitioner

## 2023-06-25 ENCOUNTER — Encounter: Payer: Self-pay | Admitting: Nurse Practitioner

## 2023-06-25 VITALS — BP 138/80 | HR 68 | Temp 98.1°F | Ht 60.0 in | Wt 110.0 lb

## 2023-06-25 DIAGNOSIS — E782 Mixed hyperlipidemia: Secondary | ICD-10-CM | POA: Diagnosis not present

## 2023-06-25 DIAGNOSIS — Z23 Encounter for immunization: Secondary | ICD-10-CM | POA: Diagnosis not present

## 2023-06-25 DIAGNOSIS — I10 Essential (primary) hypertension: Secondary | ICD-10-CM | POA: Diagnosis not present

## 2023-06-25 DIAGNOSIS — I5032 Chronic diastolic (congestive) heart failure: Secondary | ICD-10-CM | POA: Diagnosis not present

## 2023-06-25 DIAGNOSIS — J441 Chronic obstructive pulmonary disease with (acute) exacerbation: Secondary | ICD-10-CM

## 2023-06-25 DIAGNOSIS — K5000 Crohn's disease of small intestine without complications: Secondary | ICD-10-CM | POA: Diagnosis not present

## 2023-06-25 DIAGNOSIS — I700 Atherosclerosis of aorta: Secondary | ICD-10-CM

## 2023-06-25 MED ORDER — LEVOFLOXACIN 500 MG PO TABS: 500.0000 mg | ORAL_TABLET | Freq: Every day | ORAL | 0 refills | Status: AC

## 2023-06-25 MED ORDER — PREDNISONE 20 MG PO TABS: 40.0000 mg | ORAL_TABLET | Freq: Every day | ORAL | 0 refills | Status: DC

## 2023-06-25 NOTE — Assessment & Plan Note (Addendum)
 Chronic, stable.  Continue rosuvastatin Monday and Friday. Recent lipid panel normal.

## 2023-06-25 NOTE — Assessment & Plan Note (Addendum)
 Chronic, stable.  Continue metoprolol 25 mg twice daily.  Recent labs reviewed.

## 2023-06-25 NOTE — Patient Instructions (Signed)
 It was great to see you!  Start levaquin 1 tablet daily for 7 days  Start prednisone 2 tablets daily for 5 days in the morning with food   Let's follow-up in 6 months, sooner if you have concerns.  If a referral was placed today, you will be contacted for an appointment. Please note that routine referrals can sometimes take up to 3-4 weeks to process. Please call our office if you haven't heard anything after this time frame.  Take care,  Rodman Pickle, NP

## 2023-06-25 NOTE — Assessment & Plan Note (Signed)
 Chronic, stable. She is euvolemic on exam. Continue lasix 20mg  once daily as needed. Limit salt in diet. She is following with cardiology. Continue metoprolol 25mg  BID.

## 2023-06-25 NOTE — Assessment & Plan Note (Signed)
Chronic, stable.  She states that she is following with GI, however unable to see any notes.  She is not currently taking any medication for this.  Continue collaboration recommendations from GI. 

## 2023-06-25 NOTE — Assessment & Plan Note (Signed)
 Cough, dyspnea, and wheezing are likely due to COPD exacerbation from recent cold Prescribe Levaquin 500mg  tablet daily for 7 days and prednisone 40mg  daily with food for 5 days. Advise reporting if symptoms do not improve in a week. Continue albuterol inhaler/nebulizer every 6 hours as needed for shortness of breath or wheezing. She had side effects to prior maintenance inhalers and does not want to start another one.

## 2023-06-25 NOTE — Progress Notes (Signed)
 Established Patient Office Visit  Subjective   Patient ID: Erica [name], female    DOB: July 15, 1950  Age: 73 y.o. MRN: [id number]  Chief Complaint  Patient presents with   COPD    Follow up    HPI  Discussed the use of AI scribe software for clinical note transcription with the patient, who gave verbal consent to proceed.  History of Present Illness   Erica [name], a patient with a history of pneumonia and COPD, presents with a cough and difficulty breathing that has been ongoing for over a week. She attributes these symptoms to a recent cold and fluctuating weather conditions. She reports that her symptoms worsen with each cold she catches. Despite her respiratory symptoms, she continues to engage in her hobbies, such as Cytogeneticist and canvas work. She has noticed some wheezing and has been taking her albuterol inhaler regularly.  She is currently on several medications, including Flonase and Albuterol for her respiratory symptoms, and opioids for pain management. She reports taking her medications as prescribed and is diligent about managing her medication schedule.        ROS See pertinent positives and negatives per HPI.    Objective:     BP 138/80 (BP Location: Left Arm, Cuff Size: Normal)   Pulse 68   Temp 98.1 F (36.7 C) (Oral)   Ht 5' (1.524 m)   Wt 110 lb (49.9 kg)   LMP  (LMP Unknown)   SpO2 97%   BMI 21.48 kg/m    Physical Exam Vitals and nursing note reviewed.  Constitutional:      General: She is not in acute distress.    Appearance: Normal appearance.  HENT:     Head: Normocephalic.  Eyes:     Conjunctiva/sclera: Conjunctivae normal.  Cardiovascular:     Rate and Rhythm: Normal rate and regular rhythm.     Pulses: Normal pulses.     Heart sounds: Normal heart sounds.  Pulmonary:     Breath sounds: Normal breath sounds.     Comments: Mild shortness of breath while talking. Able to talk in complete sentences. Cough throughout  visit Musculoskeletal:     Cervical back: Normal range of motion.  Skin:    General: Skin is warm.  Neurological:     General: No focal deficit present.     Mental Status: She is alert and oriented to person, place, and time.  Psychiatric:        Mood and Affect: Mood normal.        Behavior: Behavior normal.        Thought Content: Thought content normal.        Judgment: Judgment normal.      Assessment & Plan:   Problem List Items Addressed This Visit       Cardiovascular and Mediastinum   (HFpEF) heart failure with preserved ejection fraction (HCC) (Chronic)   Chronic, stable. She is euvolemic on exam. Continue lasix 20mg  once daily as needed. Limit salt in diet. She is following with cardiology. Continue metoprolol 25mg  BID.       Primary hypertension   Chronic, stable.  Continue metoprolol 25 mg twice daily.  Recent labs reviewed.       Aortic atherosclerosis (HCC)   Noted on CT scan 12/27/22. Continue aspirin 81mg  daily and rosuvastatin Monday, Wednesday, Friday.         Respiratory   COPD exacerbation (HCC) - Primary   Cough, dyspnea, and wheezing are likely due to COPD  exacerbation from recent cold Prescribe Levaquin 500mg  tablet daily for 7 days and prednisone 40mg  daily with food for 5 days. Advise reporting if symptoms do not improve in a week. Continue albuterol inhaler/nebulizer every 6 hours as needed for shortness of breath or wheezing. She had side effects to prior maintenance inhalers and does not want to start another one.         Relevant Medications   predniSONE (DELTASONE) 20 MG tablet     Other   Hyperlipidemia   Chronic, stable.  Continue rosuvastatin Monday and Friday. Recent lipid panel normal.       Crohn disease (HCC)   Chronic, stable.  She states that she is following with GI, however unable to see any notes.  She is not currently taking any medication for this.  Continue collaboration recommendations from GI.       Return in about  3 months (around 09/25/2023) for chronic disease management .    Gerre Scull, NP

## 2023-06-25 NOTE — Assessment & Plan Note (Signed)
Noted on CT scan 12/27/22. Continue aspirin 81mg  daily and rosuvastatin Monday, Wednesday, Friday.

## 2023-06-27 DIAGNOSIS — F064 Anxiety disorder due to known physiological condition: Secondary | ICD-10-CM | POA: Diagnosis not present

## 2023-07-04 DIAGNOSIS — F064 Anxiety disorder due to known physiological condition: Secondary | ICD-10-CM | POA: Diagnosis not present

## 2023-07-09 ENCOUNTER — Ambulatory Visit

## 2023-07-09 DIAGNOSIS — M25511 Pain in right shoulder: Secondary | ICD-10-CM | POA: Diagnosis not present

## 2023-07-09 DIAGNOSIS — M2550 Pain in unspecified joint: Secondary | ICD-10-CM

## 2023-07-09 MED ORDER — KETOROLAC TROMETHAMINE 30 MG/ML IJ SOLN
30.0000 mg | Freq: Once | INTRAMUSCULAR | Status: AC
  Administered 2023-07-09: 30 mg via INTRAMUSCULAR

## 2023-07-09 MED ORDER — METHYLPREDNISOLONE ACETATE 40 MG/ML IJ SUSP
40.0000 mg | Freq: Once | INTRAMUSCULAR | Status: AC
  Administered 2023-07-09: 40 mg via INTRAMUSCULAR

## 2023-07-09 NOTE — Progress Notes (Signed)
 Patient got half cocktail today. Ketorolac Tromethamine 30mg /ml left buttocks and Methylprednisolone Acetate 40 mg/ml right buttocks. Patient tolerated well.

## 2023-07-18 DIAGNOSIS — F064 Anxiety disorder due to known physiological condition: Secondary | ICD-10-CM | POA: Diagnosis not present

## 2023-07-23 ENCOUNTER — Other Ambulatory Visit: Payer: Self-pay | Admitting: Nurse Practitioner

## 2023-07-24 ENCOUNTER — Other Ambulatory Visit: Payer: Self-pay | Admitting: *Deleted

## 2023-07-24 DIAGNOSIS — Z122 Encounter for screening for malignant neoplasm of respiratory organs: Secondary | ICD-10-CM

## 2023-07-24 DIAGNOSIS — F17210 Nicotine dependence, cigarettes, uncomplicated: Secondary | ICD-10-CM

## 2023-07-24 DIAGNOSIS — Z87891 Personal history of nicotine dependence: Secondary | ICD-10-CM

## 2023-07-24 NOTE — Telephone Encounter (Signed)
 Requesting: Albuterol  Sulfate HFA 108 (90 Base) MCG/ACT Inhalation Aerosol Solution  Last Visit: 06/25/2023 Next Visit: 10/01/2023 Last Refill: 03/07/2023  Please Advise

## 2023-07-25 DIAGNOSIS — F064 Anxiety disorder due to known physiological condition: Secondary | ICD-10-CM | POA: Diagnosis not present

## 2023-08-01 ENCOUNTER — Ambulatory Visit: Admitting: Family Medicine

## 2023-08-01 ENCOUNTER — Encounter: Payer: Self-pay | Admitting: Family Medicine

## 2023-08-01 VITALS — BP 124/80 | HR 69 | Ht 60.0 in

## 2023-08-01 DIAGNOSIS — M19011 Primary osteoarthritis, right shoulder: Secondary | ICD-10-CM

## 2023-08-01 DIAGNOSIS — M65331 Trigger finger, right middle finger: Secondary | ICD-10-CM

## 2023-08-01 DIAGNOSIS — M25511 Pain in right shoulder: Secondary | ICD-10-CM | POA: Diagnosis not present

## 2023-08-01 DIAGNOSIS — G8929 Other chronic pain: Secondary | ICD-10-CM | POA: Diagnosis not present

## 2023-08-01 NOTE — Assessment & Plan Note (Signed)
 Severe arthritis noted as well.  Discussed icing with exercises, discussed which activities to do and which ones to avoid.

## 2023-08-01 NOTE — Progress Notes (Signed)
 [facility] [address] [place] [address] Phone: [phone] Subjective:   Erica [name], am serving as a scribe for Dr. Ronnell Coins.  I'm seeing this patient by the request  of:  McElwee, [name], NP  CC: Neck pain, shoulder pain hand pain  [id number]  Erica [name] is a 73 y.o. female coming in with complaint of R shoulder, neck and R hand, finger pain. Patient states that her R shoulder and hand are still painful. Notes trying to carry purse on other shoulder when she can remember.   Continues to have trigger finger, middle finger, R hand.      Past Medical History:  Diagnosis Date   Acute duodenitis 04/24/2017   Acute hypoxic respiratory failure (HCC) 12/27/2022   Allergy    ANEMIA    Anxiety    BACK PAIN, LUMBAR    Cancer of ascending colon [id number] s/p colectomy 03/05/2009 02/18/2010   Qualifier: Diagnosis of  By: Willy Harvest MD, Kelleen Patee E    CHF (congestive heart failure) (HCC)    Complete rotator cuff tear of left shoulder 11/27/2013   Ultrasound guided injection on November 27, 2013    COPD (chronic obstructive pulmonary disease) with emphysema (HCC)    COPD exacerbation (HCC) 11/14/2017   Crohn's 02/2010   ileal ulcers, intol of entercort--refuses treatment   Emphysema of lung (HCC)    GERD    History of blood transfusion    w/ c/s surgery   History of transfusion of whole blood    HYPERLIPIDEMIA    HYPERTENSION    Microscopic hematuria    chronic   Myocardial infarction (HCC) 2010   Narcotic dependence (HCC) 05/25/2017   Obstruction of intestine or colon (HCC)    adhesions   OSTEOARTHRITIS    Pneumonia due to infectious organism 09/18/2014   Onset of symptoms 07/2014   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  - FOB 12/18/14 > min narrowing > BAL: cytology negative   - f/u cxr 03/22/2015 improved > f/u CT abd 06/28/2015          Rectal fissure    Possible fissure   Rotator cuff tear    SBO (small bowel obstruction)  (HCC) 02/14/2017   Takotsubo syndrome 12/2008   URINARY INCONTINENCE    Past Surgical History:  Procedure Laterality Date   ABDOMINAL HYSTERECTOMY  1994   APPENDECTOMY  1964   BLADDER SUSPENSION     CESAREAN SECTION     x 3   CHOLECYSTECTOMY  1995   COLONOSCOPY  2017   COLONOSCOPY W/ BIOPSIES AND POLYPECTOMY  01/24/2011   (Crohn's)ileitis, internal hemorrhoids   ECTOPIC PREGNANCY SURGERY     ESOPHAGOGASTRODUODENOSCOPY     FACIAL COSMETIC SURGERY     HAND SURGERY Bilateral 1991   x 2   KNEE SURGERY Right 1981   LYSIS OF ADHESION  2010   ex lap/LOA for SBO Dr Alray Askew   ORBITAL FRACTURE SURGERY  1990   RIGHT COLECTOMY  03/2009   Right colectomy for colon CA.  Dr Delane Fear   TONSILLECTOMY  1952   TOTAL KNEE ARTHROPLASTY  03/2010   Dr Bernard Brick.  Depuy   UPPER GASTROINTESTINAL ENDOSCOPY  05/16/2010   normal   UTERINE SUSPENSION     VIDEO BRONCHOSCOPY Bilateral 12/18/2014   Procedure: VIDEO BRONCHOSCOPY WITHOUT FLUORO;  Surgeon: Diamond Formica, MD;  Location: [facility];  Service: Cardiopulmonary;  Laterality: Bilateral;   Social History   Socioeconomic History  Marital status: Widowed    Spouse name: Not on file   Number of children: 5   Years of education: 14   Highest education level: High school graduate  Occupational History   Occupation: Retired     Associate Professor: UNEMPLOYED  Tobacco Use   Smoking status: Every Day    Current packs/day: 1.00    Average packs/day: 1 pack/day for 53.0 years (53.0 ttl pk-yrs)    Types: Cigarettes    Passive exposure: Never   Smokeless tobacco: Never   Tobacco comments:    Smokes 0.5 packs of cigarettes daily ARJ 08/22/22  Vaping Use   Vaping status: Never Used  Substance and Sexual Activity   Alcohol use: Yes    Comment: occasional mixed drink   Drug use: Yes    Types: Hydrocodone    Sexual activity: Not Currently    Birth control/protection: Surgical    Comment: Hysterectomy  Other Topics Concern   Not on file  Social History  Narrative   Patient is a widow.   Lives in an Apt. 1 son lives in [place] the other 4 adult children live in [place] .   Continues to smoke daily, occasional alcohol no drug use   Social Drivers of Corporate investment banker Strain: Low Risk  (01/19/2023)   Overall Financial Resource Strain (CARDIA)    Difficulty of Paying Living Expenses: Not hard at all  Food Insecurity: No Food Insecurity (01/19/2023)   Hunger Vital Sign    Worried About Running Out of Food in the Last Year: Never true    Ran Out of Food in the Last Year: Never true  Transportation Needs: No Transportation Needs (01/19/2023)   PRAPARE - Administrator, Civil Service (Medical): No    Lack of Transportation (Non-Medical): No  Physical Activity: Inactive (01/19/2023)   Exercise Vital Sign    Days of Exercise per Week: 0 days    Minutes of Exercise per Session: 0 min  Stress: No Stress Concern Present (01/19/2023)   [employer] of Occupational Health - Occupational Stress Questionnaire    Feeling of Stress : Not at all  Social Connections: Moderately Integrated (01/19/2023)   Social Connection and Isolation Panel [NHANES]    Frequency of Communication with Friends and Family: More than three times a week    Frequency of Social Gatherings with Friends and Family: More than three times a week    Attends Religious Services: More than 4 times per year    Active Member of [employer] or Organizations: Yes    Attends Banker Meetings: More than 4 times per year    Marital Status: Widowed   Allergies  Allergen Reactions   Iodinated Contrast Media Itching and Other (See Comments)    She had some itching after lumbar epidural injection in ipsilateral lower extremity 12/07/22.  Today, she had itching in her ipsilateral upper extremity after a cervical epidural injection.(12/21/2022)   Morphine Nausea And Vomiting   Family History  Problem Relation Age of Onset   Throat cancer Mother    Colon  cancer Father 48   Hypertension Father    Heart disease Father    Kidney disease Father    Colon cancer Sister 76   Liver cancer Sister    Other Sister        amyloidosis   Arthritis Other        Parent, other relative   Stomach cancer Neg Hx    Rectal cancer Neg Hx  Breast cancer Neg Hx     Current Outpatient Medications (Endocrine & Metabolic):    predniSONE  (DELTASONE ) 20 MG tablet, Take 2 tablets (40 mg total) by mouth daily with breakfast.  Current Outpatient Medications (Cardiovascular):    metoprolol  tartrate (LOPRESSOR ) 25 MG tablet, TAKE 1 TABLET BY MOUTH TWICE DAILY . APPOINTMENT REQUIRED FOR FUTURE REFILLS   rosuvastatin  (CRESTOR ) 10 MG tablet, Take 1 tablet (10 mg total) by mouth every Monday, Wednesday, and Friday. (Patient taking differently: Take 10 mg by mouth 2 (two) times a week. Every M-Th)  Current Outpatient Medications (Respiratory):    albuterol  (PROVENTIL ) (2.5 MG/3ML) 0.083% nebulizer solution, Take 3 mLs (2.5 mg total) by nebulization every 6 (six) hours as needed for wheezing or shortness of breath.   albuterol  (VENTOLIN  HFA) 108 (90 Base) MCG/ACT inhaler, INHALE 2 PUFFS BY MOUTH EVERY 4 HOURS AS NEEDED FOR WHEEZING FOR SHORTNESS OF BREATH   fluticasone  (FLONASE ) 50 MCG/ACT nasal spray, Place 2 sprays into both nostrils daily. (Patient taking differently: Place 2 sprays into both nostrils daily as needed for allergies or rhinitis.)  Current Outpatient Medications (Analgesics):    aspirin  EC 81 MG tablet, Take 81 mg by mouth at bedtime.   Current Outpatient Medications (Other):    cyclobenzaprine  (FLEXERIL ) 10 MG tablet, Take 1 tablet by mouth three times daily as needed for muscle spasm (Patient taking differently: Take 10 mg by mouth as needed. for muscle spams)   hydrOXYzine  (ATARAX ) 10 MG tablet, TAKE 1 TABLET BY MOUTH THREE TIMES DAILY AS NEEDED FOR ANXIETY   Vitamin D , Ergocalciferol , (DRISDOL ) 1.25 MG (50000 UNIT) CAPS capsule, Take 1 capsule by  mouth once a week   Reviewed prior external information including notes and imaging from  primary care provider As well as notes that were available from care everywhere and other healthcare systems.  Past medical history, social, surgical and family history all reviewed in electronic medical record.  No pertanent information unless stated regarding to the chief complaint.   Review of Systems:  No headache, visual changes, nausea, vomiting, diarrhea, constipation, dizziness, abdominal pain, skin rash, fevers, chills, night sweats, weight loss, swollen lymph nodes,chest pain, shortness of breath, mood changes. POSITIVE muscle aches, muscle aches, cough  Objective  There were no vitals taken for this visit.   General: No apparent distress alert and oriented x3 mood and affect normal, dressed appropriately.  Cachectic HEENT: Pupils equal, extraocular movements intact  Respiratory: Patient's speak in full sentences and does not appear short of breath  Cardiovascular: No lower extremity edema, non tender, no erythema  Right shoulder does have some atrophy noted.  Crepitus noted with range of motion.  After informed written and verbal consent, patient was seated on exam table. Right shoulder was prepped with alcohol swab and utilizing posterior approach, patient's right glenohumeral space was injected with 4:1  marcaine 0.5%: Kenalog  40mg /dL. Patient tolerated the procedure well without immediate complications.  After verbal consent patient was prepped with alcohol swab and with a 25-gauge half inch needle injected into the right acromioclavicular joint with a total of 0.5 cc of 0.5% Marcaine and 0.5 cc of Kenalog  40 mg/mL no blood loss, Band-Aid placed.  Postinjection instruction given  After verbal consent patient was prepped with alcohol swab and in the right middle finger flexor tendon sheath at the A2 pulley injected with 0.5 cc of 0.5% Marcaine and 0.5 cc of Kenalog  40 mg/mL    Impression  and Recommendations:    The above documentation has  been reviewed and is accurate and complete Tulio Facundo M Marrianne Sica, DO

## 2023-08-01 NOTE — Patient Instructions (Signed)
 Injection in hand and shoulder Good to see you! See you again in 10-12 weeks

## 2023-08-01 NOTE — Assessment & Plan Note (Signed)
 Injection given again today.  Tolerated the procedure well, has been a little bit of time since we have done one again.  Is going to be traveling and wanted it not to be hurting her as much.  Hopeful that this will make a difference.  Patient wants to avoid all surgical intervention.  Follow-up again 6 to 8 weeks otherwise.

## 2023-08-03 ENCOUNTER — Other Ambulatory Visit: Payer: Self-pay | Admitting: Nurse Practitioner

## 2023-08-03 ENCOUNTER — Other Ambulatory Visit: Payer: Self-pay | Admitting: Physician Assistant

## 2023-08-03 NOTE — Telephone Encounter (Addendum)
 Requesting: Vitamin D  (Ergocalciferol ) 1.25 MG (50000 UT) Oral Capsule  Last Visit: 06/25/2023 Next Visit: 10/01/2023 Last Refill: 05/15/2023  Please Advise    Patient prefers Rx and not take an OTC Vitamin

## 2023-08-08 DIAGNOSIS — F064 Anxiety disorder due to known physiological condition: Secondary | ICD-10-CM | POA: Diagnosis not present

## 2023-08-15 DIAGNOSIS — F064 Anxiety disorder due to known physiological condition: Secondary | ICD-10-CM | POA: Diagnosis not present

## 2023-08-21 ENCOUNTER — Encounter: Payer: Self-pay | Admitting: Registered Nurse

## 2023-08-21 ENCOUNTER — Encounter: Payer: Medicare Other | Attending: Physical Medicine and Rehabilitation | Admitting: Registered Nurse

## 2023-08-21 VITALS — BP 142/89 | HR 86 | Ht 60.0 in | Wt 108.4 lb

## 2023-08-21 DIAGNOSIS — G8929 Other chronic pain: Secondary | ICD-10-CM | POA: Diagnosis not present

## 2023-08-21 DIAGNOSIS — M5416 Radiculopathy, lumbar region: Secondary | ICD-10-CM | POA: Insufficient documentation

## 2023-08-21 DIAGNOSIS — M25561 Pain in right knee: Secondary | ICD-10-CM | POA: Insufficient documentation

## 2023-08-21 DIAGNOSIS — G894 Chronic pain syndrome: Secondary | ICD-10-CM | POA: Diagnosis not present

## 2023-08-21 DIAGNOSIS — Z5181 Encounter for therapeutic drug level monitoring: Secondary | ICD-10-CM | POA: Diagnosis not present

## 2023-08-21 DIAGNOSIS — Z79899 Other long term (current) drug therapy: Secondary | ICD-10-CM | POA: Insufficient documentation

## 2023-08-21 DIAGNOSIS — M25562 Pain in left knee: Secondary | ICD-10-CM | POA: Insufficient documentation

## 2023-08-21 MED ORDER — HYDROCODONE-ACETAMINOPHEN 5-325 MG PO TABS: 1.0000 | ORAL_TABLET | Freq: Two times a day (BID) | ORAL | 0 refills | Status: DC

## 2023-08-21 NOTE — Progress Notes (Signed)
 Subjective:    Patient ID: Erica [name], female    DOB: May 07, 1950, 73 y.o.   MRN: [id number]  HPI: Erica [name] is a 73 y.o. female who returns for follow up appointment for chronic pain and medication refill.She  states her pain is located in her lower back radiating into her left lower extremity, bilateral knees and generalized joint pain.She rates her pain 9. Her current exercise regime is walking and performing stretching exercises.  Erica [name] Morphine equivalent is 10.00 MME.   Last UDS was Performed on 05/28/2023, it was consistent.    Pain Inventory Average Pain 8 Pain Right Now 9 My pain is sharp  In the last 24 hours, has pain interfered with the following? General activity 5 Relation with others 4 Enjoyment of life 2 What TIME of day is your pain at its worst? morning , daytime, and night Sleep (in general) Fair  Pain is worse with: walking Pain improves with: medication Relief from Meds: 8  Family History  Problem Relation Age of Onset   Throat cancer Mother    Colon cancer Father 58   Hypertension Father    Heart disease Father    Kidney disease Father    Colon cancer Sister 25   Liver cancer Sister    Other Sister        amyloidosis   Arthritis Other        Parent, other relative   Stomach cancer Neg Hx    Rectal cancer Neg Hx    Breast cancer Neg Hx    Social History   Socioeconomic History   Marital status: Widowed    Spouse name: Not on file   Number of children: 5   Years of education: 14   Highest education level: High school graduate  Occupational History   Occupation: Retired     Associate Professor: UNEMPLOYED  Tobacco Use   Smoking status: Every Day    Current packs/day: 1.00    Average packs/day: 1 pack/day for 53.0 years (53.0 ttl pk-yrs)    Types: Cigarettes    Passive exposure: Never   Smokeless tobacco: Never   Tobacco comments:    Smokes 0.5 packs of cigarettes daily [facility]J 08/22/22  Vaping Use   Vaping status: Never Used   Substance and Sexual Activity   Alcohol use: Yes    Comment: occasional mixed drink   Drug use: Yes    Types: Hydrocodone    Sexual activity: Not Currently    Birth control/protection: Surgical    Comment: Hysterectomy  Other Topics Concern   Not on file  Social History Narrative   Patient is a widow.   Lives in an Apt. 1 son lives in [place] the other 4 adult children live in [place] .   Continues to smoke daily, occasional alcohol no drug use   Social Drivers of Corporate investment banker Strain: Low Risk  (01/19/2023)   Overall Financial Resource Strain (CARDIA)    Difficulty of Paying Living Expenses: Not hard at all  Food Insecurity: No Food Insecurity (01/19/2023)   Hunger Vital Sign    Worried About Running Out of Food in the Last Year: Never true    Ran Out of Food in the Last Year: Never true  Transportation Needs: No Transportation Needs (01/19/2023)   PRAPARE - Administrator, Civil Service (Medical): No    Lack of Transportation (Non-Medical): No  Physical Activity: Inactive (01/19/2023)   Exercise Vital Sign    Days of Exercise  per Week: 0 days    Minutes of Exercise per Session: 0 min  Stress: No Stress Concern Present (01/19/2023)   [employer] of Occupational Health - Occupational Stress Questionnaire    Feeling of Stress : Not at all  Social Connections: Moderately Integrated (01/19/2023)   Social Connection and Isolation Panel [NHANES]    Frequency of Communication with Friends and Family: More than three times a week    Frequency of Social Gatherings with Friends and Family: More than three times a week    Attends Religious Services: More than 4 times per year    Active Member of [employer] or Organizations: Yes    Attends Banker Meetings: More than 4 times per year    Marital Status: Widowed   Past Surgical History:  Procedure Laterality Date   ABDOMINAL HYSTERECTOMY  1994   APPENDECTOMY  1964   BLADDER SUSPENSION      CESAREAN SECTION     x 3   CHOLECYSTECTOMY  1995   COLONOSCOPY  2017   COLONOSCOPY W/ BIOPSIES AND POLYPECTOMY  01/24/2011   (Crohn's)ileitis, internal hemorrhoids   ECTOPIC PREGNANCY SURGERY     ESOPHAGOGASTRODUODENOSCOPY     FACIAL COSMETIC SURGERY     HAND SURGERY Bilateral 1991   x 2   KNEE SURGERY Right 1981   LYSIS OF ADHESION  2010   ex lap/LOA for SBO Dr Alray Askew   ORBITAL FRACTURE SURGERY  1990   RIGHT COLECTOMY  03/2009   Right colectomy for colon CA.  Dr Delane Fear   TONSILLECTOMY  1952   TOTAL KNEE ARTHROPLASTY  03/2010   Dr Bernard Brick.  Depuy   UPPER GASTROINTESTINAL ENDOSCOPY  05/16/2010   normal   UTERINE SUSPENSION     VIDEO BRONCHOSCOPY Bilateral 12/18/2014   Procedure: VIDEO BRONCHOSCOPY WITHOUT FLUORO;  Surgeon: Diamond Formica, MD;  Location: [facility];  Service: Cardiopulmonary;  Laterality: Bilateral;   Past Surgical History:  Procedure Laterality Date   ABDOMINAL HYSTERECTOMY  1994   APPENDECTOMY  1964   BLADDER SUSPENSION     CESAREAN SECTION     x 3   CHOLECYSTECTOMY  1995   COLONOSCOPY  2017   COLONOSCOPY W/ BIOPSIES AND POLYPECTOMY  01/24/2011   (Crohn's)ileitis, internal hemorrhoids   ECTOPIC PREGNANCY SURGERY     ESOPHAGOGASTRODUODENOSCOPY     FACIAL COSMETIC SURGERY     HAND SURGERY Bilateral 1991   x 2   KNEE SURGERY Right 1981   LYSIS OF ADHESION  2010   ex lap/LOA for SBO Dr Alray Askew   ORBITAL FRACTURE SURGERY  1990   RIGHT COLECTOMY  03/2009   Right colectomy for colon CA.  Dr Delane Fear   TONSILLECTOMY  1952   TOTAL KNEE ARTHROPLASTY  03/2010   Dr Bernard Brick.  Depuy   UPPER GASTROINTESTINAL ENDOSCOPY  05/16/2010   normal   UTERINE SUSPENSION     VIDEO BRONCHOSCOPY Bilateral 12/18/2014   Procedure: VIDEO BRONCHOSCOPY WITHOUT FLUORO;  Surgeon: Diamond Formica, MD;  Location: [facility];  Service: Cardiopulmonary;  Laterality: Bilateral;   Past Medical History:  Diagnosis Date   Acute duodenitis 04/24/2017   Acute hypoxic respiratory  failure (HCC) 12/27/2022   Allergy    ANEMIA    Anxiety    BACK PAIN, LUMBAR    Cancer of ascending colon [id number] s/p colectomy 03/05/2009 02/18/2010   Qualifier: Diagnosis of  By: Willy Harvest MD, Kelleen Patee E    CHF (congestive heart failure) (HCC)    Complete rotator  cuff tear of left shoulder 11/27/2013   Ultrasound guided injection on November 27, 2013    COPD (chronic obstructive pulmonary disease) with emphysema (HCC)    COPD exacerbation (HCC) 11/14/2017   Crohn's 02/2010   ileal ulcers, intol of entercort--refuses treatment   Emphysema of lung (HCC)    GERD    History of blood transfusion    w/ c/s surgery   History of transfusion of whole blood    HYPERLIPIDEMIA    HYPERTENSION    Microscopic hematuria    chronic   Myocardial infarction (HCC) 2010   Narcotic dependence (HCC) 05/25/2017   Obstruction of intestine or colon (HCC)    adhesions   OSTEOARTHRITIS    Pneumonia due to infectious organism 09/18/2014   Onset of symptoms 07/2014   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  - FOB 12/18/14 > min narrowing > BAL: cytology negative   - f/u cxr 03/22/2015 improved > f/u CT abd 06/28/2015          Rectal fissure    Possible fissure   Rotator cuff tear    SBO (small bowel obstruction) (HCC) 02/14/2017   Takotsubo syndrome 12/2008   URINARY INCONTINENCE    BP (!) 142/89 (BP Location: Left Arm, Patient Position: Sitting, Cuff Size: Normal)   Pulse 86   Ht 5' (1.524 m)   Wt 108 lb 6.4 oz (49.2 kg)   LMP  (LMP Unknown)   SpO2 91%   BMI 21.17 kg/m   Opioid Risk Score:   Fall Risk Score:  `1  Depression screen [facility] 2/9     08/21/2023    8:43 AM 08/21/2023    8:41 AM 05/28/2023    9:11 AM 02/28/2023    9:10 AM 01/19/2023    1:41 PM 11/20/2022    2:10 PM 03/23/2022    9:19 PM  Depression screen PHQ 2/9  Decreased Interest  0 0 0 0 0 0  Down, Depressed, Hopeless 0 0 0 0 0 0 0  PHQ - 2 Score 0 0 0 0 0 0 0  Altered sleeping 1 1    1    Tired, decreased energy 1 1    0   Change  in appetite 0 0    0   Feeling bad or failure about yourself  0 0    0   Trouble concentrating 0 0    0   Moving slowly or fidgety/restless 0 0    0   Suicidal thoughts 0 0    0   PHQ-9 Score 2 2    1    Difficult doing work/chores Somewhat difficult Somewhat difficult    Not difficult at all       Review of Systems  Musculoskeletal:  Positive for arthralgias, back pain and gait problem.       Back, pain radiates into legs, walking increases pain, gait is off patient states "it's all due to spinal cord injury"  Neurological:        Back spasms from spinal cord injury per patient  All other systems reviewed and are negative.      Objective:   Physical Exam Vitals and nursing note reviewed.  Constitutional:      Appearance: Normal appearance.  Cardiovascular:     Rate and Rhythm: Normal rate and regular rhythm.     Pulses: Normal pulses.     Heart sounds: Normal heart sounds.  Pulmonary:     Effort: Pulmonary effort is normal.  Breath sounds: Normal breath sounds.  Musculoskeletal:     Comments: Normal Muscle Bulk and Muscle Testing Reveals:  Upper Extremities: Decreased ROM  45 Degrees and Muscle Strength 4/5 Bilateral AC Joint Tenderness  Lumbar Paraspinal Tenderness: L-4-L-5 Lower Extremities: Decreased ROM and Muscle Strength 5/5 Bilateral Lower Extremities Flexion Produces Pain into her Bilateral Patella's  Arises from Table slowly  Narrow Based  Gait     Skin:    General: Skin is warm and dry.  Neurological:     Mental Status: She is alert and oriented to person, place, and time.  Psychiatric:        Mood and Affect: Mood normal.        Behavior: Behavior normal.         Assessment & Plan:  Lumbar Radiculitis: Per Dr Dorn Gaskins note: Ms. Drouillard failed Gabapentin . Continue Current medication regimen. Continue to Monitor. 08/21/2023 Bilateral Chronic Knee Pain: Continue HEP as Tolerated. Continue to Monitor. 08/21/2023 3. Chronic Pain Syndrome. Refilled:  Hydrocodone  5/325 mg one tablet twice a day as needed for pain #60.  Following Months Prescriptions sent to pharmacy, to accommodate scheduled appointment. We will continue the opioid monitoring program, this consists of regular clinic visits, examinations, urine drug screen, pill counts as well as use of [place]  Controlled Substance Reporting system. A 12 month History has been reviewed on the   Controlled Substance Reporting System on 08/21/2023   F/U in 3 months.

## 2023-08-22 DIAGNOSIS — F064 Anxiety disorder due to known physiological condition: Secondary | ICD-10-CM | POA: Diagnosis not present

## 2023-08-29 DIAGNOSIS — F064 Anxiety disorder due to known physiological condition: Secondary | ICD-10-CM | POA: Diagnosis not present

## 2023-09-01 ENCOUNTER — Other Ambulatory Visit: Payer: Self-pay | Admitting: Nurse Practitioner

## 2023-09-03 NOTE — Telephone Encounter (Signed)
 Requesting: Albuterol  Sulfate HFA 108 (90 Base) MCG/ACT Inhalation Aerosol Solution  Last Visit: 06/25/2023 Next Visit: 10/01/2023 Last Refill: 07/24/2023  Please Advise

## 2023-09-05 DIAGNOSIS — E782 Mixed hyperlipidemia: Secondary | ICD-10-CM | POA: Diagnosis not present

## 2023-09-05 LAB — LIPID PANEL

## 2023-09-06 ENCOUNTER — Ambulatory Visit: Payer: Self-pay

## 2023-09-06 LAB — LIPID PANEL
Cholesterol, Total: 144 mg/dL (ref 100–199)
HDL: 61 mg/dL (ref >39)
LDL CALC COMMENT:: 2.4 ratio (ref 0.0–4.4)
LDL Chol Calc (NIH): 61 mg/dL (ref 0–99)
Triglycerides: 127 mg/dL (ref 0–149)
VLDL Cholesterol Cal: 22 mg/dL (ref 5–40)

## 2023-09-06 LAB — ALT: ALT: 12 IU/L (ref 0–32)

## 2023-09-12 DIAGNOSIS — F064 Anxiety disorder due to known physiological condition: Secondary | ICD-10-CM | POA: Diagnosis not present

## 2023-09-24 ENCOUNTER — Other Ambulatory Visit: Payer: Self-pay | Admitting: Nurse Practitioner

## 2023-09-24 NOTE — Telephone Encounter (Signed)
 Requesting: Albuterol  Sulfate HFA 108 (90 Base) MCG/ACT Inhalation Aerosol Solution  Last Visit: 06/25/2023 Next Visit: 10/01/2023 Last Refill: 09/03/2023  Please Advise

## 2023-09-25 ENCOUNTER — Ambulatory Visit: Payer: Self-pay

## 2023-09-25 NOTE — Telephone Encounter (Signed)
 FYI Only or Action Required?: FYI only for provider.  Patient was last seen in primary care on 06/25/2023 by Nedra Tinnie LABOR, NP. Called Nurse Triage reporting Cough. Symptoms began several days ago. Interventions attempted: Prescription medications: see note. Symptoms are: gradually worsening.  Triage Disposition: Go to ED Now (Notify PCP) Pt declined. See note.   Patient/caregiver understands and will follow disposition?: Yes      Copied from CRM [id number]. Topic: Clinical - Red Word Triage >> Sep 25, 2023  3:12 PM Drema MATSU wrote: Red Word that prompted transfer to Nurse Triage: Patient thinks she may have pnemonia again while she was in [place]  and is requesting Prednisone  and antibiotics until she see pcp. She is having shortness of breath (struggling to breathe) and very fatigue. She has tried to self medicate and was unsuccessful. Reason for Disposition  Chest pain  (Exception: MILD central chest pain, present only when coughing.)  Answer Assessment - Initial Assessment Questions Additional Info:  Referred to Emergency Dept, patient declined and reported  I refused to go to ED. I hate going there, but I'll go to an UC if I have to..  Offered to call CAL line to notify PCP, patient declined  no it's okay, you don't have to bother her.  Patient reports she will call her son to bring her to UC, but is requesitng if PCP can send her antibiotics and prednisone .    -------------------------------------------------------------------------------------------   1. ONSET: When did the cough begin?      -----last week    2. SEVERITY: How bad is the cough today?      ------------------- Moderate    3. SPUTUM: Describe the color of your sputum (none, dry cough; clear, white, yellow, green)     ------------------------ Green    4. HEMOPTYSIS: Are you coughing up any blood? If so ask: How much? (flecks, streaks, tablespoons, etc.)     -----------------  Denies   5. DIFFICULTY BREATHING: Are you having difficulty breathing? If Yes, ask: How bad is it? (e.g., mild, moderate, severe)    - MILD: No SOB at rest, mild SOB with walking, speaks normally in sentences, can lie down, no retractions, pulse < 100.    - MODERATE: SOB at rest, SOB with minimal exertion and prefers to sit, cannot lie down flat, speaks in phrases, mild retractions, audible wheezing, pulse 100-120.    - SEVERE: Very SOB at rest, speaks in single words, struggling to breathe, sitting hunched forward, retractions, pulse > 120        -----------------Moderate: Difficulty at rest ( able to articulate needs and can speak in full sentences)    6. FEVER: Do you have a fever? If Yes, ask: What is your temperature, how was it measured, and when did it start?     ---------------------------- Denies fever   8. LUNG HISTORY: Do you have any history of lung disease?  (e.g., pulmonary embolus, asthma, emphysema)     --- COPD. Hx of PNA    10. OTHER SYMPTOMS: Do you have any other symptoms? (e.g., runny nose, wheezing, chest pain)       ----------- Mild chest pain after coughing. Patient used nebulizer today- Brief relief.     12. TRAVEL: Have you traveled out of the country in the last month? (e.g., travel history, exposures)       -- Yes, just came back to [place] .      .  Protocols used: Cough - Acute Productive-A-AH

## 2023-09-25 NOTE — Telephone Encounter (Signed)
 Noted. Patient advised to go tot the ED by nurse triage.

## 2023-09-26 DIAGNOSIS — F064 Anxiety disorder due to known physiological condition: Secondary | ICD-10-CM | POA: Diagnosis not present

## 2023-09-30 DIAGNOSIS — R051 Acute cough: Secondary | ICD-10-CM | POA: Diagnosis not present

## 2023-09-30 DIAGNOSIS — J441 Chronic obstructive pulmonary disease with (acute) exacerbation: Secondary | ICD-10-CM | POA: Diagnosis not present

## 2023-09-30 DIAGNOSIS — J189 Pneumonia, unspecified organism: Secondary | ICD-10-CM | POA: Diagnosis not present

## 2023-10-01 ENCOUNTER — Encounter: Payer: Self-pay | Admitting: Nurse Practitioner

## 2023-10-01 ENCOUNTER — Ambulatory Visit (INDEPENDENT_AMBULATORY_CARE_PROVIDER_SITE_OTHER): Admitting: Nurse Practitioner

## 2023-10-01 VITALS — BP 124/88 | HR 91 | Temp 95.6°F | Ht 60.0 in | Wt 107.6 lb

## 2023-10-01 DIAGNOSIS — J189 Pneumonia, unspecified organism: Secondary | ICD-10-CM

## 2023-10-01 DIAGNOSIS — J439 Emphysema, unspecified: Secondary | ICD-10-CM | POA: Diagnosis not present

## 2023-10-01 NOTE — Progress Notes (Signed)
 Established Patient Office Visit  Subjective   Patient ID: Erica [name], female    DOB: Aug 22, 1950  Age: 73 y.o. MRN: [id number]  Chief Complaint  Patient presents with   Medical Management of Chronic Issues   Cough    Patient states she was seen at an urgent care on [facility] yesterday-had x-rays and told results were abnormal, diagnosed with PNA, follow up in 4 weeks    HPI  Discussed the use of AI scribe software for clinical note transcription with the patient, who gave verbal consent to proceed.  History of Present Illness   Erica [name] is a 73 year old female with emphysema and COPD who presents with pneumonia in the lower left lobe.  She was diagnosed with pneumonia in the lower left lobe following an x-ray at urgent care yesterday and is currently on prednisone  and Levaquin . She experiences persistent coughing and shortness of breath, which have worsened due to the pneumonia. She has no fever or chest pain. She has nasal congestion and uses Flonase . She regularly uses her nebulizer and ensures her rescue inhaler is refilled. She has emphysema and COPD, which typically worsen with colds. She is vigilant about wearing a mask to prevent further illness.        ROS See pertinent positives and negatives per HPI.    Objective:     BP 124/88   Pulse 91   Temp (!) 95.6 F (35.3 C) (Temporal)   Ht 5' (1.524 m)   Wt 107 lb 9.6 oz (48.8 kg)   LMP  (LMP Unknown)   SpO2 98%   BMI 21.01 kg/m    Physical Exam Vitals and nursing note reviewed.  Constitutional:      General: She is not in acute distress.    Appearance: Normal appearance.  HENT:     Head: Normocephalic.     Right Ear: Tympanic membrane, ear canal and external ear normal.     Left Ear: Tympanic membrane, ear canal and external ear normal.   Eyes:     Conjunctiva/sclera: Conjunctivae normal.    Cardiovascular:     Rate and Rhythm: Normal rate and regular rhythm.     Pulses: Normal  pulses.     Heart sounds: Normal heart sounds.  Pulmonary:     Effort: Pulmonary effort is normal.     Comments: Breath sounds diminished bilaterally, no wheezing or ronchii noted  Musculoskeletal:     Cervical back: Normal range of motion and neck supple. No tenderness.  Lymphadenopathy:     Cervical: No cervical adenopathy.   Skin:    General: Skin is warm.   Neurological:     General: No focal deficit present.     Mental Status: She is alert and oriented to person, place, and time.   Psychiatric:        Mood and Affect: Mood normal.        Behavior: Behavior normal.        Thought Content: Thought content normal.        Judgment: Judgment normal.      Assessment & Plan:   Problem List Items Addressed This Visit       Respiratory   Pneumonia of left lower lobe due to infectious organism   Pneumonia in the lower left lobe is confirmed by x-ray, with symptoms of cough, pressure, and shortness of breath. Oxygen saturation 98%. She is adherent to prednisone  and Levaquin . Continue prednisone  and Levaquin  as prescribed. Schedule a  follow-up lung scan at the end of July. Discussed ER precautions. She is up to date on pneumonia vaccines.       Relevant Medications   levofloxacin  (LEVAQUIN ) 500 MG tablet   COPD (chronic obstructive pulmonary disease) (HCC) - Primary   COPD complicates respiratory infections and contributes to chronic shortness of breath. She regularly uses an albuterol  nebulizer and albuterol  rescue inhaler. Ensure the rescue inhaler is refilled and available. Continue using the nebulizer as needed. Follow-up in 3 months.        Return in about 3 months (around 01/01/2024) for COPD.    Tinnie DELENA Harada, NP

## 2023-10-01 NOTE — Assessment & Plan Note (Signed)
 COPD complicates respiratory infections and contributes to chronic shortness of breath. She regularly uses an albuterol  nebulizer and albuterol  rescue inhaler. Ensure the rescue inhaler is refilled and available. Continue using the nebulizer as needed. Follow-up in 3 months.

## 2023-10-01 NOTE — Patient Instructions (Addendum)
 It was great to see you!  Keep taking the prednisone  and levaquin   Schedule your lung cancer screen at the end of July which can also follow-up on the pneumonia [phone]  Let's follow-up in 3 months, sooner if you have concerns.  If a referral was placed today, you will be contacted for an appointment. Please note that routine referrals can sometimes take up to 3-4 weeks to process. Please call our office if you haven't heard anything after this time frame.  Take care,  Tinnie Harada, NP

## 2023-10-01 NOTE — Assessment & Plan Note (Signed)
 Pneumonia in the lower left lobe is confirmed by x-ray, with symptoms of cough, pressure, and shortness of breath. Oxygen saturation 98%. She is adherent to prednisone  and Levaquin . Continue prednisone  and Levaquin  as prescribed. Schedule a follow-up lung scan at the end of July. Discussed ER precautions. She is up to date on pneumonia vaccines.

## 2023-10-03 ENCOUNTER — Other Ambulatory Visit: Payer: Self-pay | Admitting: Nurse Practitioner

## 2023-10-03 DIAGNOSIS — Z1231 Encounter for screening mammogram for malignant neoplasm of breast: Secondary | ICD-10-CM

## 2023-10-03 DIAGNOSIS — F064 Anxiety disorder due to known physiological condition: Secondary | ICD-10-CM | POA: Diagnosis not present

## 2023-10-09 ENCOUNTER — Other Ambulatory Visit

## 2023-10-10 ENCOUNTER — Ambulatory Visit: Admitting: Family Medicine

## 2023-10-10 DIAGNOSIS — F064 Anxiety disorder due to known physiological condition: Secondary | ICD-10-CM | POA: Diagnosis not present

## 2023-10-16 ENCOUNTER — Ambulatory Visit
Admission: RE | Admit: 2023-10-16 | Discharge: 2023-10-16 | Disposition: A | Discharge status: Home / Self Care | Source: Ambulatory Visit | Attending: Acute Care | Admitting: Acute Care

## 2023-10-16 DIAGNOSIS — F064 Anxiety disorder due to known physiological condition: Secondary | ICD-10-CM | POA: Diagnosis not present

## 2023-10-16 DIAGNOSIS — Z122 Encounter for screening for malignant neoplasm of respiratory organs: Secondary | ICD-10-CM | POA: Diagnosis not present

## 2023-10-16 DIAGNOSIS — F17210 Nicotine dependence, cigarettes, uncomplicated: Secondary | ICD-10-CM | POA: Diagnosis not present

## 2023-10-16 DIAGNOSIS — Z87891 Personal history of nicotine dependence: Secondary | ICD-10-CM

## 2023-10-20 ENCOUNTER — Other Ambulatory Visit: Payer: Self-pay | Admitting: Nurse Practitioner

## 2023-10-22 ENCOUNTER — Other Ambulatory Visit: Payer: Self-pay

## 2023-10-22 ENCOUNTER — Telehealth: Payer: Self-pay

## 2023-10-22 DIAGNOSIS — Z122 Encounter for screening for malignant neoplasm of respiratory organs: Secondary | ICD-10-CM

## 2023-10-22 DIAGNOSIS — R911 Solitary pulmonary nodule: Secondary | ICD-10-CM

## 2023-10-22 DIAGNOSIS — Z87891 Personal history of nicotine dependence: Secondary | ICD-10-CM

## 2023-10-22 DIAGNOSIS — F17210 Nicotine dependence, cigarettes, uncomplicated: Secondary | ICD-10-CM

## 2023-10-22 NOTE — Telephone Encounter (Signed)
 Spoke with patient and reviewed recent Lung CT results. She was diagnosed with pneumonia 10/01/2023 and has completed antibiotics. Advises she has a f/u appt with Urgent Care 10/28/2023 to confirm she is doing better from the pneumonia. She will complete a f/u Lung CT on 12/18/2023 to evaluate the nodules. Results and plan to PCP.   IMPRESSION: 1. Waxing and waning areas of nodular consolidation with multiple new lesions in the right middle and right lower lobes. Findings favor an infectious/inflammatory etiology. Lung-RADS 0, incomplete. Recommend follow-up low-dose lung cancer screening CT in 4-6 weeks, appropriate clinical therapy, as malignancy cannot be excluded. 2. Left adrenal adenoma. 3. Aortic atherosclerosis (ICD10-I70.0). Coronary artery calcification. 4.  Emphysema (ICD10-J43.9).

## 2023-10-22 NOTE — Telephone Encounter (Signed)
 I called and spoke with patient and notified her that I will call the radiology reading room to get report read.

## 2023-10-22 NOTE — Telephone Encounter (Signed)
 Copied from CRM [id number]. Topic: Clinical - Lab/Test Results >> Oct 22, 2023  9:59 AM Mercedes MATSU wrote: Reason for CRM: Patient called in requesting a call back to go over her recent CT lung screening results.  Patient can be reached at [phone].

## 2023-10-22 NOTE — Telephone Encounter (Signed)
 I called radiology reading room and spoke with Ray to have CT Chest/Lung image read.

## 2023-10-23 NOTE — Telephone Encounter (Signed)
 Copied from CRM [id number]. Topic: Clinical - Medication Question >> Oct 23, 2023  9:55 AM Revonda D wrote: Reason for CRM: Pt is calling to check the status of her medication refill for the albuterol  (VENTOLIN  HFA) 108 (90 Base) MCG/ACT inhaler. Pt stated that the pharmacy stated they sent over multiple requests but hasn't received a response from the office. Pt would like for the medication to be approved today and would also like to receive a callback with an update.

## 2023-10-23 NOTE — Telephone Encounter (Signed)
 Requesting: Albuterol  Sulfate HFA 108 (90 Base) MCG/ACT Inhalation Aerosol Solution  Last Visit: 10/01/2023 Next Visit: 01/01/2024 Last Refill: 09/24/2023  Please Advise

## 2023-10-23 NOTE — Telephone Encounter (Signed)
 I called and spoke with patient and pharmacy had called her earlier that Rx was ready to be picked up.

## 2023-10-23 NOTE — Telephone Encounter (Signed)
 Noted. Pulmonology called patient.

## 2023-10-24 DIAGNOSIS — F064 Anxiety disorder due to known physiological condition: Secondary | ICD-10-CM | POA: Diagnosis not present

## 2023-10-27 ENCOUNTER — Other Ambulatory Visit: Payer: Self-pay | Admitting: Nurse Practitioner

## 2023-10-28 DIAGNOSIS — R051 Acute cough: Secondary | ICD-10-CM | POA: Diagnosis not present

## 2023-10-28 DIAGNOSIS — J189 Pneumonia, unspecified organism: Secondary | ICD-10-CM | POA: Diagnosis not present

## 2023-10-29 NOTE — Telephone Encounter (Signed)
 Requesting: hydrOXYzine  HCl 10 MG Oral Tablet  Last Visit: 10/01/2023 Next Visit: 01/01/2024 Last Refill: 02/26/2023  Please Advise

## 2023-10-31 ENCOUNTER — Telehealth: Payer: Self-pay

## 2023-10-31 DIAGNOSIS — F064 Anxiety disorder due to known physiological condition: Secondary | ICD-10-CM | POA: Diagnosis not present

## 2023-10-31 MED ORDER — HYDROCODONE-ACETAMINOPHEN 5-325 MG PO TABS: 1.0000 | ORAL_TABLET | Freq: Two times a day (BID) | ORAL | 0 refills | Status: DC

## 2023-10-31 NOTE — Telephone Encounter (Signed)
 Patient calling into the office regarding rx for Hydrocodone .  Patient picked up last refill in June and went to pharmacy to pick up rx for month of July but, pharmacy stated they not received an rx for Hydrocodone  since the one that was picked up on September 25, 2023.  Patient states she took her last pill on 09/27/23 and has been trying to contact our office to get a message to [facility] regarding her refill but, has been unsuccessful.  I did call the pharmacy to confirm if there was a prescription there and they did indicate that they have not rec'd anything from our office for a July rx.  Patient is very upset and concerned because of her pain medication and would like to know what she needs to do in the meantime if we are not going to fill the medication. Please advise on refill and I will notify patient.

## 2023-11-01 NOTE — Progress Notes (Unsigned)
 [name] [address] [place] [address] Phone: [phone] Subjective:   ISusannah Gully, am serving as a scribe for Dr. Arthea Claudene.  I'm seeing this patient by the request  of:  McElwee, [name], NP  CC: Multiple joint complaints  YEP:Dlagzrupcz  08/01/2023 Severe arthritis noted as well.  Discussed icing with exercises, discussed which activities to do and which ones to avoid.     Injection given again today.  Tolerated the procedure well, has been a little bit of time since we have done one again.  Is going to be traveling and wanted it not to be hurting her as much.  Hopeful that this will make a difference.  Patient wants to avoid all surgical intervention.  Follow-up again 6 to 8 weeks otherwise      Update 11/06/2023 Nozomi Mettler is a 73 y.o. female coming in with complaint of R shoulder and R middle finger pain. Patient states injections in hand and shoulder. Having a lot of pain.  Patient is seen in physical medicine and rehabilitation.    Past Medical History:  Diagnosis Date   Acute duodenitis 04/24/2017   Acute hypoxic respiratory failure (HCC) 12/27/2022   Allergy    ANEMIA    Anxiety    BACK PAIN, LUMBAR    Cancer of ascending colon [id number] s/p colectomy 03/05/2009 02/18/2010   Qualifier: Diagnosis of  By: Avram MD, NOLIA Pitts E    CHF (congestive heart failure) (HCC)    Complete rotator cuff tear of left shoulder 11/27/2013   Ultrasound guided injection on November 27, 2013    COPD (chronic obstructive pulmonary disease) with emphysema (HCC)    COPD exacerbation (HCC) 11/14/2017   Crohn's 02/2010   ileal ulcers, intol of entercort--refuses treatment   Emphysema of lung (HCC)    GERD    History of blood transfusion    w/ c/s surgery   History of transfusion of whole blood    HYPERLIPIDEMIA    HYPERTENSION    Microscopic hematuria    chronic   Myocardial infarction (HCC) 2010   Narcotic dependence (HCC)  05/25/2017   Obstruction of intestine or colon (HCC)    adhesions   OSTEOARTHRITIS    Pneumonia due to infectious organism 09/18/2014   Onset of symptoms 07/2014   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  - FOB 12/18/14 > min narrowing > BAL: cytology negative   - f/u cxr 03/22/2015 improved > f/u CT abd 06/28/2015          Rectal fissure    Possible fissure   Rotator cuff tear    SBO (small bowel obstruction) (HCC) 02/14/2017   Takotsubo syndrome 12/2008   URINARY INCONTINENCE    Past Surgical History:  Procedure Laterality Date   ABDOMINAL HYSTERECTOMY  1994   APPENDECTOMY  1964   BLADDER SUSPENSION     CESAREAN SECTION     x 3   CHOLECYSTECTOMY  1995   COLONOSCOPY  2017   COLONOSCOPY W/ BIOPSIES AND POLYPECTOMY  01/24/2011   (Crohn's)ileitis, internal hemorrhoids   ECTOPIC PREGNANCY SURGERY     ESOPHAGOGASTRODUODENOSCOPY     FACIAL COSMETIC SURGERY     HAND SURGERY Bilateral 1991   x 2   KNEE SURGERY Right 1981   LYSIS OF ADHESION  2010   ex lap/LOA for SBO Dr Mikell   ORBITAL FRACTURE SURGERY  1990   RIGHT COLECTOMY  03/2009   Right colectomy for colon  [place].  Dr Ebbie   TONSILLECTOMY  1952   TOTAL KNEE ARTHROPLASTY  03/2010   Dr Ernie.  Depuy   UPPER GASTROINTESTINAL ENDOSCOPY  05/16/2010   normal   UTERINE SUSPENSION     VIDEO BRONCHOSCOPY Bilateral 12/18/2014   Procedure: VIDEO BRONCHOSCOPY WITHOUT FLUORO;  Surgeon: Ozell KATHEE America, MD;  Location: [facility];  Service: Cardiopulmonary;  Laterality: Bilateral;   Social History   Socioeconomic History   Marital status: Widowed    Spouse name: Not on file   Number of children: 5   Years of education: 14   Highest education level: High school graduate  Occupational History   Occupation: Retired     Associate Professor: UNEMPLOYED  Tobacco Use   Smoking status: Every Day    Current packs/day: 1.00    Average packs/day: 1 pack/day for 53.0 years (53.0 ttl pk-yrs)    Types: Cigarettes    Passive exposure: Never   Smokeless  tobacco: Never   Tobacco comments:    Smokes 0.5 packs of cigarettes daily [facility] 08/22/22  Vaping Use   Vaping status: Never Used  Substance and Sexual Activity   Alcohol use: Yes    Comment: occasional mixed drink   Drug use: Yes    Types: Hydrocodone    Sexual activity: Not Currently    Birth control/protection: Surgical    Comment: Hysterectomy  Other Topics Concern   Not on file  Social History Narrative   Patient is a widow.   Lives in an Apt. 1 son lives in [place] the other 4 adult children live in [place] .   Continues to smoke daily, occasional alcohol no drug use   Social Drivers of Corporate investment banker Strain: Low Risk  (01/19/2023)   Overall Financial Resource Strain (CARDIA)    Difficulty of Paying Living Expenses: Not hard at all  Food Insecurity: No Food Insecurity (01/19/2023)   Hunger Vital Sign    Worried About Running Out of Food in the Last Year: Never true    Ran Out of Food in the Last Year: Never true  Transportation Needs: No Transportation Needs (01/19/2023)   PRAPARE - Administrator, Civil Service (Medical): No    Lack of Transportation (Non-Medical): No  Physical Activity: Inactive (01/19/2023)   Exercise Vital Sign    Days of Exercise per Week: 0 days    Minutes of Exercise per Session: 0 min  Stress: No Stress Concern Present (01/19/2023)   [employer] of Occupational Health - Occupational Stress Questionnaire    Feeling of Stress : Not at all  Social Connections: Moderately Integrated (01/19/2023)   Social Connection and Isolation Panel    Frequency of Communication with Friends and Family: More than three times a week    Frequency of Social Gatherings with Friends and Family: More than three times a week    Attends Religious Services: More than 4 times per year    Active Member of [employer] or Organizations: Yes    Attends Banker Meetings: More than 4 times per year    Marital Status: Widowed   Allergies   Allergen Reactions   Iodinated Contrast Media Itching and Other (See Comments)    She had some itching after lumbar epidural injection in ipsilateral lower extremity 12/07/22.  Today, she had itching in her ipsilateral upper extremity after a cervical epidural injection.(12/21/2022)   Morphine Nausea And Vomiting   Family History  Problem Relation Age of Onset   Throat cancer Mother  Colon cancer Father 35   Hypertension Father    Heart disease Father    Kidney disease Father    Colon cancer Sister 47   Liver cancer Sister    Other Sister        amyloidosis   Arthritis Other        Parent, other relative   Stomach cancer Neg Hx    Rectal cancer Neg Hx    Breast cancer Neg Hx     Current Outpatient Medications (Endocrine & Metabolic):    predniSONE  (DELTASONE ) 20 MG tablet, Take 2 tablets (40 mg total) by mouth daily with breakfast.  Current Outpatient Medications (Cardiovascular):    metoprolol  tartrate (LOPRESSOR ) 25 MG tablet, Take 1 tablet (25 mg total) by mouth 2 (two) times daily.   rosuvastatin  (CRESTOR ) 10 MG tablet, Take 1 tablet (10 mg total) by mouth every Monday, Wednesday, and Friday. (Patient taking differently: Take 10 mg by mouth 2 (two) times a week. Every M-Th)  Current Outpatient Medications (Respiratory):    albuterol  (PROVENTIL ) (2.5 MG/3ML) 0.083% nebulizer solution, Take 3 mLs (2.5 mg total) by nebulization every 6 (six) hours as needed for wheezing or shortness of breath.   albuterol  (VENTOLIN  HFA) 108 (90 Base) MCG/ACT inhaler, INHALE 2 PUFFS BY MOUTH EVERY 4 HOURS AS NEEDED FOR WHEEZING FOR SHORTNESS OF BREATH   fluticasone  (FLONASE ) 50 MCG/ACT nasal spray, Place 2 sprays into both nostrils daily. (Patient taking differently: Place 2 sprays into both nostrils daily as needed for allergies or rhinitis.)  Current Outpatient Medications (Analgesics):    aspirin  EC 81 MG tablet, Take 81 mg by mouth at bedtime.   HYDROcodone -acetaminophen  (NORCO/VICODIN)  5-325 MG tablet, Take 1 tablet by mouth in the morning and at bedtime.   Current Outpatient Medications (Other):    cyclobenzaprine  (FLEXERIL ) 10 MG tablet, Take 1 tablet by mouth three times daily as needed for muscle spasm (Patient taking differently: Take 10 mg by mouth as needed. for muscle spams)   hydrOXYzine  (ATARAX ) 10 MG tablet, TAKE 1 TABLET BY MOUTH THREE TIMES DAILY AS NEEDED FOR ANXIETY   levofloxacin  (LEVAQUIN ) 500 MG tablet, Take 500 mg by mouth daily.   Vitamin D , Ergocalciferol , (DRISDOL ) 1.25 MG (50000 UNIT) CAPS capsule, Take 1 capsule by mouth once a week   Reviewed prior external information including notes and imaging from  primary care provider As well as notes that were available from care everywhere and other healthcare systems.  Past medical history, social, surgical and family history all reviewed in electronic medical record.  No pertanent information unless stated regarding to the chief complaint.   Review of Systems:  No headache, visual changes, nausea, vomiting, diarrhea, constipation, dizziness, abdominal pain, skin rash, fevers, chills, night sweats, weight loss, swollen lymph nodes, body aches, joint swelling, chest pain, shortness of breath, mood changes. POSITIVE muscle aches  Objective  Blood pressure (!) 132/92, pulse 79, height 5' (1.524 m), weight 108 lb (49 kg), SpO2 94%.   General: No apparent distress alert and oriented x3 mood and affect normal, dressed appropriately.  HEENT: Pupils equal, extraocular movements intact  Respiratory: Patient's speak in full sentences and does not appear short of breath  Cardiovascular: No lower extremity edema, non tender, no erythema  Multiple different joint complaints noted today.  Patient's shoulder significant tenderness Arthritis of multiple joints.  Patient does have significant atrophy noted of the shoulder girdle right greater than left. Severe swelling noted of the acromioclavicular joint as  well.  Trigger nodule  noted in the A2 pulley of the middle finger on the right hand.  Procedure: Real-time Ultrasound Guided Injection of right glenohumeral joint Device: GE Logiq Q7  Ultrasound guided injection is preferred based studies that show increased duration, increased effect, greater accuracy, decreased procedural pain, increased response rate with ultrasound guided versus blind injection.  Verbal informed consent obtained.  Time-out conducted.  Noted no overlying erythema, induration, or other signs of local infection.  Skin prepped in a sterile fashion.  Local anesthesia: Topical Ethyl chloride.  With sterile technique and under real time ultrasound guidance:  Joint visualized.  23g 1  inch needle inserted posterior approach. Pictures taken for needle placement. Patient did have injection of 2 cc of 1% lidocaine , 2 cc of 0.5% Marcaine, and 1.0 cc of Kenalog  40 mg/dL. Completed without difficulty  Pain immediately resolved suggesting accurate placement of the medication.  Images saved Advised to call if fevers/chills, erythema, induration, drainage, or persistent bleeding.  Impression: Technically successful ultrasound guided injection.  Procedure: Real-time Ultrasound Guided Injection of right acromioclavicular joint Device: GE Logiq Q7 Ultrasound guided injection is preferred based studies that show increased duration, increased effect, greater accuracy, decreased procedural pain, increased response rate, and decreased cost with ultrasound guided versus blind injection.  Verbal informed consent obtained.  Time-out conducted.  Noted no overlying erythema, induration, or other signs of local infection.  Skin prepped in a sterile fashion.  Local anesthesia: Topical Ethyl chloride.  With sterile technique and under real time ultrasound guidance: With a 25-gauge half inch needle injected with 0.5 cc of 0.5% Marcaine and 0.5 cc of Kenalog  40 mg/mL Completed without difficulty   Pain immediately resolved suggesting accurate placement of the medication.  Advised to call if fevers/chills, erythema, induration, drainage, or persistent bleeding.  Impression: Technically successful ultrasound guided injection.  Procedure: Real-time Ultrasound Guided Injection of right middle flexor tendon sheath Device: GE Logiq Q7 Ultrasound guided injection is preferred based studies that show increased duration, increased effect, greater accuracy, decreased procedural pain, increased response rate, and decreased cost with ultrasound guided versus blind injection.  Verbal informed consent obtained.  Time-out conducted.  Noted no overlying erythema, induration, or other signs of local infection.  Skin prepped in a sterile fashion.  Local anesthesia: Topical Ethyl chloride.  With sterile technique and under real time ultrasound guidance: With a 25-gauge half inch needle injected with 0.5 cc of 0.5% Marcaine and 0.5 cc of Kenalog  40 mg/mL into the flexor tendon sheath Completed without difficulty  Pain immediately resolved suggesting accurate placement of the medication.  Advised to call if fevers/chills, erythema, induration, drainage, or persistent bleeding.  Images saved Impression: Technically successful ultrasound guided injection.   Impression and Recommendations:     The above documentation has been reviewed and is accurate and complete Khalon Cansler M Dhruvan Gullion, DO

## 2023-11-06 ENCOUNTER — Other Ambulatory Visit: Payer: Self-pay

## 2023-11-06 ENCOUNTER — Ambulatory Visit (INDEPENDENT_AMBULATORY_CARE_PROVIDER_SITE_OTHER): Admitting: Family Medicine

## 2023-11-06 ENCOUNTER — Encounter: Payer: Self-pay | Admitting: Family Medicine

## 2023-11-06 VITALS — BP 132/92 | HR 79 | Ht 60.0 in | Wt 108.0 lb

## 2023-11-06 DIAGNOSIS — M65331 Trigger finger, right middle finger: Secondary | ICD-10-CM

## 2023-11-06 DIAGNOSIS — M12811 Other specific arthropathies, not elsewhere classified, right shoulder: Secondary | ICD-10-CM

## 2023-11-06 DIAGNOSIS — M19011 Primary osteoarthritis, right shoulder: Secondary | ICD-10-CM | POA: Diagnosis not present

## 2023-11-06 NOTE — Assessment & Plan Note (Signed)
 Repeat injection given today, tolerated the procedure well, discussed icing regimen, discussed avoiding certain activities.  Avoid crossover motions.  Avoid heavy lifting.  No worsening pain can repeat injections every 3 months if needed

## 2023-11-06 NOTE — Patient Instructions (Signed)
 Injections today See me in 3 months

## 2023-11-06 NOTE — Assessment & Plan Note (Signed)
 Discussed with patient about icing regimen of home exercises, which activities to do in which ones to avoid.  Increase activity slowly.  Wants to avoid any surgical intervention and would be relatively high risk anyhow.  Continue to monitor.  Will continue to see how patient does with her tobacco abuse.  Continues to lose weight and is following up with primary care in the near future

## 2023-11-06 NOTE — Assessment & Plan Note (Signed)
 Injection given today and tolerated the procedure well, discussed icing regimen and home exercises, discussed with patient about potential bracing at night.  Still seems to be responding relatively well to the injections.  Patient wants to avoid any surgical intervention.

## 2023-11-07 DIAGNOSIS — F064 Anxiety disorder due to known physiological condition: Secondary | ICD-10-CM | POA: Diagnosis not present

## 2023-11-19 ENCOUNTER — Encounter
Payer: Medicare Other | Attending: Physical Medicine and Rehabilitation | Admitting: Physical Medicine and Rehabilitation

## 2023-11-19 ENCOUNTER — Encounter: Payer: Self-pay | Admitting: Physical Medicine and Rehabilitation

## 2023-11-19 VITALS — BP 158/95 | HR 89 | Ht 60.0 in | Wt 107.0 lb

## 2023-11-19 DIAGNOSIS — M25511 Pain in right shoulder: Secondary | ICD-10-CM | POA: Insufficient documentation

## 2023-11-19 DIAGNOSIS — I10 Essential (primary) hypertension: Secondary | ICD-10-CM | POA: Insufficient documentation

## 2023-11-19 DIAGNOSIS — M25561 Pain in right knee: Secondary | ICD-10-CM | POA: Insufficient documentation

## 2023-11-19 DIAGNOSIS — M25562 Pain in left knee: Secondary | ICD-10-CM | POA: Diagnosis not present

## 2023-11-19 DIAGNOSIS — M5416 Radiculopathy, lumbar region: Secondary | ICD-10-CM | POA: Insufficient documentation

## 2023-11-19 DIAGNOSIS — Z79899 Other long term (current) drug therapy: Secondary | ICD-10-CM | POA: Diagnosis not present

## 2023-11-19 DIAGNOSIS — Z5181 Encounter for therapeutic drug level monitoring: Secondary | ICD-10-CM | POA: Insufficient documentation

## 2023-11-19 DIAGNOSIS — G894 Chronic pain syndrome: Secondary | ICD-10-CM | POA: Diagnosis not present

## 2023-11-19 DIAGNOSIS — M5010 Cervical disc disorder with radiculopathy, unspecified cervical region: Secondary | ICD-10-CM | POA: Insufficient documentation

## 2023-11-19 DIAGNOSIS — G8929 Other chronic pain: Secondary | ICD-10-CM | POA: Diagnosis not present

## 2023-11-19 NOTE — Progress Notes (Signed)
 Subjective:    Patient ID: Erica [name], female    DOB: September 16, 1950, 73 y.o.   MRN: [id number]  HPI  Erica [name] is a 73 y.o. year old female  who  has a past medical history of Acute duodenitis (04/24/2017), Acute hypoxic respiratory failure (HCC) (12/27/2022), Allergy, ANEMIA, Anxiety, BACK PAIN, LUMBAR, Cancer of ascending colon pTispN0 s/p colectomy 03/05/2009 (02/18/2010), CHF (congestive heart failure) (HCC), Complete rotator cuff tear of left shoulder (11/27/2013), COPD (chronic obstructive pulmonary disease) with emphysema (HCC), COPD exacerbation (HCC) (11/14/2017), Crohn's (02/2010), Emphysema of lung (HCC), GERD, History of blood transfusion, History of transfusion of whole blood, HYPERLIPIDEMIA, HYPERTENSION, Microscopic hematuria, Myocardial infarction (HCC) (2010), Narcotic dependence (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   They are presenting to PM&R clinic for follow up related to neck and back pain  .  Plan from last visit: Lumbar Radiculitis: Per Dr Emeline note: Erica [name] failed Gabapentin . Continue Current medication regimen. Continue to Monitor. 08/21/2023 Bilateral Chronic Knee Pain: Continue HEP as Tolerated. Continue to Monitor. 08/21/2023 3. Chronic Pain Syndrome. Refilled: Hydrocodone  5/325 mg one tablet twice a day as needed for pain #60.  Following Months Prescriptions sent to pharmacy, to accommodate scheduled appointment. We will continue the opioid monitoring program, this consists of regular clinic visits, examinations, urine drug screen, pill counts as well as use of [place]  Controlled Substance Reporting system. A 12 month History has been reviewed on the [place]  Controlled Substance Reporting System on 08/21/2023    F/U in 3 months.    Interval Hx:  - Therapies: She is getting out often - she went out school shopping with her grandchildren yesterday. She goes to the store and is fairly independent if she takes her medication.    - Follow ups: Sees Dr. Claudene with Sports Medicine for R shoulder injecitons, R trigger finger, and and L knee (R knee s/p TKA 14 years ago - she does not want surgery repeat yet). Her R RTC is worsening but they are avoiding surgery as long as possible.   She had nerve blocks in her lumbar and cervical spine - these did not work well.    - Falls: none   - DME: none - she has a brace for her left knee but she does not use it.    - Medications:   Norco 5 mg BID last filled 10/31/23 - pain is well controlled on current  Flexeril  10 mg TID PRN - using it occassionally; does find it effective for spasms Prednisone  40 mg daily - was on for pneumonia - off now Uses a cream as well for her knee, which helps   - Other concerns: none  Pain Inventory Average Pain 8 Pain Right Now 7 My pain is sharp and aching  In the last 24 hours, has pain interfered with the following? General activity 6 Relation with others 6 Enjoyment of life 6 What TIME of day is your pain at its worst? morning  and evening Sleep (in general) Poor  Pain is worse with: walking and bending Pain improves with: medication Relief from Meds: 9  Family History  Problem Relation Age of Onset   Throat cancer Mother    Colon cancer Father 47   Hypertension Father    Heart disease Father    Kidney disease Father    Colon cancer  Sister 13   Liver cancer Sister    Other Sister        amyloidosis   Arthritis Other        Parent, other relative   Stomach cancer Neg Hx    Rectal cancer Neg Hx    Breast cancer Neg Hx    Social History   Socioeconomic History   Marital status: Widowed    Spouse name: Not on file   Number of children: 5   Years of education: 14   Highest education level: High school  graduate  Occupational History   Occupation: Retired     Associate Professor: UNEMPLOYED  Tobacco Use   Smoking status: Every Day    Current packs/day: 1.00    Average packs/day: 1 pack/day for 53.0 years (53.0 ttl pk-yrs)    Types: Cigarettes    Passive exposure: Never   Smokeless tobacco: Never   Tobacco comments:    Smokes 0.5 packs of cigarettes daily ARJ 08/22/22  Vaping Use   Vaping status: Never Used  Substance and Sexual Activity   Alcohol use: Yes    Comment: occasional mixed drink   Drug use: Yes    Types: Hydrocodone    Sexual activity: Not Currently    Birth control/protection: Surgical    Comment: Hysterectomy  Other Topics Concern   Not on file  Social History Narrative   Patient is a widow.   Lives in an Apt. 1 son lives in [place] the other 4 adult children live in [place] .   Continues to smoke daily, occasional alcohol no drug use   Social Drivers of Corporate investment banker Strain: Low Risk  (01/19/2023)   Overall Financial Resource Strain (CARDIA)    Difficulty of Paying Living Expenses: Not hard at all  Food Insecurity: No Food Insecurity (01/19/2023)   Hunger Vital Sign    Worried About Running Out of Food in the Last Year: Never true    Ran Out of Food in the Last Year: Never true  Transportation Needs: No Transportation Needs (01/19/2023)   PRAPARE - Administrator, Civil Service (Medical): No    Lack of Transportation (Non-Medical): No  Physical Activity: Inactive (01/19/2023)   Exercise Vital Sign    Days of Exercise per Week: 0 days    Minutes of Exercise per Session: 0 min  Stress: No Stress Concern Present (01/19/2023)   [employer] of Occupational Health - Occupational Stress Questionnaire    Feeling of Stress : Not at all  Social Connections: Moderately Integrated (01/19/2023)   Social Connection and Isolation Panel    Frequency of Communication with Friends and Family: More than three times a week    Frequency of Social  Gatherings with Friends and Family: More than three times a week    Attends Religious Services: More than 4 times per year    Active Member of [employer] or Organizations: Yes    Attends Banker Meetings: More than 4 times per year    Marital Status: Widowed   Past Surgical History:  Procedure Laterality Date   ABDOMINAL HYSTERECTOMY  1994   APPENDECTOMY  1964   BLADDER SUSPENSION     CESAREAN SECTION     x 3   CHOLECYSTECTOMY  1995   COLONOSCOPY  2017   COLONOSCOPY W/ BIOPSIES AND POLYPECTOMY  01/24/2011   (Crohn's)ileitis, internal hemorrhoids   ECTOPIC PREGNANCY SURGERY     ESOPHAGOGASTRODUODENOSCOPY     FACIAL COSMETIC SURGERY  HAND SURGERY Bilateral 1991   x 2   KNEE SURGERY Right 1981   LYSIS OF ADHESION  2010   ex lap/LOA for SBO Dr Mikell   ORBITAL FRACTURE SURGERY  1990   RIGHT COLECTOMY  03/2009   Right colectomy for colon CA.  Dr Ebbie   TONSILLECTOMY  1952   TOTAL KNEE ARTHROPLASTY  03/2010   Dr Ernie.  Depuy   UPPER GASTROINTESTINAL ENDOSCOPY  05/16/2010   normal   UTERINE SUSPENSION     VIDEO BRONCHOSCOPY Bilateral 12/18/2014   Procedure: VIDEO BRONCHOSCOPY WITHOUT FLUORO;  Surgeon: Ozell KATHEE America, MD;  Location: [facility];  Service: Cardiopulmonary;  Laterality: Bilateral;   Past Surgical History:  Procedure Laterality Date   ABDOMINAL HYSTERECTOMY  1994   APPENDECTOMY  1964   BLADDER SUSPENSION     CESAREAN SECTION     x 3   CHOLECYSTECTOMY  1995   COLONOSCOPY  2017   COLONOSCOPY W/ BIOPSIES AND POLYPECTOMY  01/24/2011   (Crohn's)ileitis, internal hemorrhoids   ECTOPIC PREGNANCY SURGERY     ESOPHAGOGASTRODUODENOSCOPY     FACIAL COSMETIC SURGERY     HAND SURGERY Bilateral 1991   x 2   KNEE SURGERY Right 1981   LYSIS OF ADHESION  2010   ex lap/LOA for SBO Dr Mikell   ORBITAL FRACTURE SURGERY  1990   RIGHT COLECTOMY  03/2009   Right colectomy for colon CA.  Dr Ebbie   TONSILLECTOMY  1952   TOTAL KNEE ARTHROPLASTY  03/2010    Dr Ernie.  Depuy   UPPER GASTROINTESTINAL ENDOSCOPY  05/16/2010   normal   UTERINE SUSPENSION     VIDEO BRONCHOSCOPY Bilateral 12/18/2014   Procedure: VIDEO BRONCHOSCOPY WITHOUT FLUORO;  Surgeon: Ozell KATHEE America, MD;  Location: [facility];  Service: Cardiopulmonary;  Laterality: Bilateral;   Past Medical History:  Diagnosis Date   Acute duodenitis 04/24/2017   Acute hypoxic respiratory failure (HCC) 12/27/2022   Allergy    ANEMIA    Anxiety    BACK PAIN, LUMBAR    Cancer of ascending colon pTispN0 s/p colectomy 03/05/2009 02/18/2010   Qualifier: Diagnosis of  By: Avram MD, NOLIA Pitts E    CHF (congestive heart failure) (HCC)    Complete rotator cuff tear of left shoulder 11/27/2013   Ultrasound guided injection on November 27, 2013    COPD (chronic obstructive pulmonary disease) with emphysema (HCC)    COPD exacerbation (HCC) 11/14/2017   Crohn's 02/2010   ileal ulcers, intol of entercort--refuses treatment   Emphysema of lung (HCC)    GERD    History of blood transfusion    w/ c/s surgery   History of transfusion of whole blood    HYPERLIPIDEMIA    HYPERTENSION    Microscopic hematuria    chronic   Myocardial infarction (HCC) 2010   Narcotic dependence (HCC) 05/25/2017   Obstruction of intestine or colon (HCC)    adhesions   OSTEOARTHRITIS    Pneumonia due to infectious organism 09/18/2014   Onset of symptoms 07/2014   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  - FOB 12/18/14 > min narrowing > BAL: cytology negative   - f/u cxr 03/22/2015 improved > f/u CT abd 06/28/2015          Rectal fissure    Possible fissure   Rotator cuff tear    SBO (small bowel obstruction) (HCC) 02/14/2017   Takotsubo syndrome 12/2008   URINARY INCONTINENCE    BP (!) 158/95 (BP  Location: Left Arm, Patient Position: Sitting, Cuff Size: Small) Comment: Second BP reading  Pulse 89   Ht 5' (1.524 m)   Wt 107 lb (48.5 kg)   LMP  (LMP Unknown)   SpO2 93%   BMI 20.90 kg/m   Opioid Risk Score:   Fall  Risk Score:  `1  Depression screen [facility] 2/9     11/19/2023    9:09 AM 08/21/2023    8:43 AM 08/21/2023    8:41 AM 05/28/2023    9:11 AM 02/28/2023    9:10 AM 01/19/2023    1:41 PM 11/20/2022    2:10 PM  Depression screen PHQ 2/9  Decreased Interest 0  0 0 0 0 0  Down, Depressed, Hopeless 0 0 0 0 0 0 0  PHQ - 2 Score 0 0 0 0 0 0 0  Altered sleeping 0 1 1    1   Tired, decreased energy 0 1 1    0  Change in appetite 0 0 0    0  Feeling bad or failure about yourself  0 0 0    0  Trouble concentrating 0 0 0    0  Moving slowly or fidgety/restless 0 0 0    0  Suicidal thoughts 0 0 0    0  PHQ-9 Score 0 2 2    1   Difficult doing work/chores Somewhat difficult Somewhat difficult Somewhat difficult    Not difficult at all      Review of Systems  Musculoskeletal:  Positive for arthralgias, back pain, myalgias and neck pain.       Bilateral shoulder, hip pain, right thigh and inguinal pain  All other systems reviewed and are negative.      Objective:   Physical Exam  PE: Constitution: Appropriate appearance for age. No apparent distress   Resp: No respiratory distress. No accessory muscle usage. on RA Cardio: Well perfused appearance. No peripheral edema. Abdomen: Nondistended. Nontender.   Psych: Appropriate mood and affect. Neuro: AAOx4. No apparent cognitive deficits    Neurologic Exam:   Sensory exam: revealed normal sensation in all dermatomal regions in bilateral upper extremities and bilateral lower extremities Motor exam: strength 5/5 throughout bilateral upper extremities and bilateral lower extremities Coordination: Fine motor coordination was normal.   Gait: Antalgic, stiff  -- stable  MSK: R knee extension limited <5 degrees from neutral Bilateral shoulder ROM limited in abduction + TTP low back, bilateral PSIS, R knee medial and lateral joint lines, bilateral traps      Assessment & Plan:   Erica [name] is a 73 y.o. year old female  who  has a past  medical history of Acute duodenitis (04/24/2017), Acute hypoxic respiratory failure (HCC) (12/27/2022), Allergy, ANEMIA, Anxiety, BACK PAIN, LUMBAR, Cancer of ascending colon pTispN0 s/p colectomy 03/05/2009 (02/18/2010), CHF (congestive heart failure) (HCC), Complete rotator cuff tear of left shoulder (11/27/2013), COPD (chronic obstructive pulmonary disease) with emphysema (HCC), COPD exacerbation (HCC) (11/14/2017), Crohn's (02/2010), Emphysema of lung (HCC), GERD, History of blood transfusion, History of transfusion of whole blood, HYPERLIPIDEMIA, HYPERTENSION, Microscopic hematuria, Myocardial infarction (HCC) (2010), Narcotic dependence (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   They are presenting to PM&R clinic as a follow up for treatment of neck and back pain; increasing R knee pain as well .    Chronic pain syndrome  Encounter for therapeutic drug monitoring Encounter for chronic pain management -     ToxAssure Select,+Antidepr,UR  I will refill Norco 5 mg BID  for 3 months; follow up with Fidela in 3  months Continue flexeril  10 mg PRN  Bilateral chronic knee pain Chronic R shoulder pain Sees Dr. Claudene with Sports Medicine for R shoulder injecitons, R trigger finger, and and L knee (R knee s/p TKA 14 years ago - she does not want surgery repeat yet). Her R RTC is worsening but they are avoiding surgery as long as possible.    Cervical disc disorder with radiculopathy of cervical region Lumbar radiculopathy, acute  She had nerve blocks in her lumbar and cervical spine - ineffective, does not wish to re-trial.  Essential Hypertension  Patient missed AM dose metoprolol , advised her to take it when she gets home

## 2023-11-19 NOTE — Progress Notes (Signed)
   Subjective:    Patient ID: Erica [name], female    DOB: 02/09/1951, 73 y.o.   MRN: [id number]  HPI    Review of Systems     Objective:   Physical Exam        Assessment & Plan:

## 2023-11-19 NOTE — Patient Instructions (Addendum)
 I will refill pain medicaiton for 3 months; follow up with Fidela in 3  months  Patient missed AM dose metoprolol , advised her to take it when she gets home

## 2023-11-21 DIAGNOSIS — F064 Anxiety disorder due to known physiological condition: Secondary | ICD-10-CM | POA: Diagnosis not present

## 2023-11-21 LAB — TOXASSURE SELECT,+ANTIDEPR,UR

## 2023-11-22 ENCOUNTER — Other Ambulatory Visit: Payer: Self-pay | Admitting: Nurse Practitioner

## 2023-11-22 NOTE — Telephone Encounter (Signed)
 Patient prefers Rx and not take an OTC Vitamin

## 2023-11-22 NOTE — Telephone Encounter (Signed)
 Requesting: Vitamin D  (Ergocalciferol ) 1.25 MG (50000 UT) Oral Capsule  Last Visit: 10/01/2023 Next Visit: 01/01/2024 Last Refill: 08/03/2023  Please Advise

## 2023-11-27 ENCOUNTER — Ambulatory Visit

## 2023-11-27 MED ORDER — HYDROCODONE-ACETAMINOPHEN 5-325 MG PO TABS: 1.0000 | ORAL_TABLET | Freq: Two times a day (BID) | ORAL | 0 refills | Status: DC

## 2023-11-27 MED ORDER — HYDROCODONE-ACETAMINOPHEN 5-325 MG PO TABS: 1.0000 | ORAL_TABLET | Freq: Two times a day (BID) | ORAL | 0 refills | Status: AC

## 2023-11-28 DIAGNOSIS — F064 Anxiety disorder due to known physiological condition: Secondary | ICD-10-CM | POA: Diagnosis not present

## 2023-12-02 ENCOUNTER — Other Ambulatory Visit: Payer: Self-pay | Admitting: Nurse Practitioner

## 2023-12-02 DIAGNOSIS — Z23 Encounter for immunization: Secondary | ICD-10-CM | POA: Diagnosis not present

## 2023-12-04 NOTE — Telephone Encounter (Signed)
 Requesting: Albuterol  Sulfate (2.5 MG/3ML) 0.083% Inhalation Nebulization Solution  Last Visit: 10/01/2023 Next Visit: 01/01/2024 Last Refill: 03/23/2022  Please Advise

## 2023-12-05 DIAGNOSIS — F064 Anxiety disorder due to known physiological condition: Secondary | ICD-10-CM | POA: Diagnosis not present

## 2023-12-11 ENCOUNTER — Telehealth: Payer: Self-pay

## 2023-12-11 ENCOUNTER — Other Ambulatory Visit: Payer: Self-pay

## 2023-12-11 DIAGNOSIS — J441 Chronic obstructive pulmonary disease with (acute) exacerbation: Secondary | ICD-10-CM

## 2023-12-11 DIAGNOSIS — R062 Wheezing: Secondary | ICD-10-CM

## 2023-12-11 DIAGNOSIS — J439 Emphysema, unspecified: Secondary | ICD-10-CM

## 2023-12-11 MED ORDER — ALBUTEROL SULFATE (2.5 MG/3ML) 0.083% IN NEBU: 2.5000 mg | INHALATION_SOLUTION | Freq: Four times a day (QID) | RESPIRATORY_TRACT | 3 refills | Status: DC | PRN

## 2023-12-11 NOTE — Telephone Encounter (Signed)
 I called and spoke with patient and notified her that her Rx of albuterol  (PROVENTIL ) (2.5 MG/3ML) 0.083% nebulizer solution was sent back to the pharmacy on 12/04/2023 and she will have to call the pharmacy to fill.

## 2023-12-11 NOTE — Telephone Encounter (Signed)
 RX was refilled and sent with DX code as requested on 12/11/2023

## 2023-12-11 NOTE — Telephone Encounter (Signed)
 Copied from CRM [id number]. Topic: Clinical - Medication Question >> Oct 23, 2023  9:55 AM Revonda D wrote: Reason for CRM: Pt is calling to check the status of her medication refill for the albuterol  (VENTOLIN  HFA) 108 (90 Base) MCG/ACT inhaler. Pt stated that the pharmacy stated they sent over multiple requests but hasn't received a response from the office. Pt would like for the medication to be approved today and would also like to receive a callback with an update. >> Dec 11, 2023  9:36 AM Rosina BIRCH wrote: Diona from [facility] pharmacy called stating they need a diagnosis code and they need a new prescription with a diagnosis code for the albuterol  CB [phone]

## 2023-12-11 NOTE — Telephone Encounter (Signed)
 Copied from CRM [id number]. Topic: Clinical - Prescription Issue >> Dec 11, 2023  1:46 PM Thersia BROCKS wrote: Reason for CRM: [facility] called in regarding prescription   albuterol  (PROVENTIL ) (2.5 MG/3ML) 0.083% nebulizer solution needs to have diagnosis code on the prescription

## 2023-12-12 DIAGNOSIS — F064 Anxiety disorder due to known physiological condition: Secondary | ICD-10-CM | POA: Diagnosis not present

## 2023-12-12 MED ORDER — ALBUTEROL SULFATE (2.5 MG/3ML) 0.083% IN NEBU: 2.5000 mg | INHALATION_SOLUTION | Freq: Four times a day (QID) | RESPIRATORY_TRACT | 3 refills | Status: AC | PRN

## 2023-12-12 NOTE — Telephone Encounter (Signed)
 Patient notified that Dx code submitted with Rx.

## 2023-12-12 NOTE — Telephone Encounter (Signed)
 Copied from CRM [id number]. Topic: Clinical - Prescription Issue >> Dec 11, 2023  1:46 PM Thersia BROCKS wrote: Reason for CRM: [facility] called in regarding prescription   albuterol  (PROVENTIL ) (2.5 MG/3ML) 0.083% nebulizer solution needs to have diagnosis code on the prescription >> Dec 12, 2023 10:18 AM Henretta I wrote: [facility] pharmacy called back in regards to script as it was sent over with a diagnosis code only for wheezing but Medicare only covers for chronic respiratory condition, so for it to be covered it needs to be resent with different diagnosis code

## 2023-12-12 NOTE — Telephone Encounter (Signed)
Dx code added.

## 2023-12-18 ENCOUNTER — Ambulatory Visit
Admission: RE | Admit: 2023-12-18 | Discharge: 2023-12-18 | Disposition: A | Discharge status: Home / Self Care | Source: Ambulatory Visit | Attending: Acute Care | Admitting: Acute Care

## 2023-12-18 DIAGNOSIS — Z122 Encounter for screening for malignant neoplasm of respiratory organs: Secondary | ICD-10-CM

## 2023-12-18 DIAGNOSIS — F17210 Nicotine dependence, cigarettes, uncomplicated: Secondary | ICD-10-CM | POA: Diagnosis not present

## 2023-12-18 DIAGNOSIS — R911 Solitary pulmonary nodule: Secondary | ICD-10-CM

## 2023-12-18 DIAGNOSIS — J432 Centrilobular emphysema: Secondary | ICD-10-CM | POA: Diagnosis not present

## 2023-12-18 DIAGNOSIS — Z87891 Personal history of nicotine dependence: Secondary | ICD-10-CM

## 2023-12-19 DIAGNOSIS — F064 Anxiety disorder due to known physiological condition: Secondary | ICD-10-CM | POA: Diagnosis not present

## 2023-12-25 ENCOUNTER — Telehealth: Payer: Self-pay | Admitting: Acute Care

## 2023-12-25 NOTE — Telephone Encounter (Signed)
 Received a message from Candyce Munroe that patient called and requested someone call her with results.  Patient states she has called multiple times for results and is upset.  Called patient and apologized for any delay in results and explained the process of how results are read and received by us .  The patient states she had been calling the imaging facility regarding her results and today they told her to call the pulmonary office.   Our office received the results at 7pm last night (12/24/23) and our provider has not yet had a chance to review them.  Informed that the reading does show some improvement and we will need to have Lauraine Lites, NP review the results before we can plan for her next CT.  Inquired as to how the patient is feeling and if she has any lingering symptoms of illness.  She explains she had 'pneumonia' prior to the first LDCT in July.  She was treated with antibiotic and prednisone .  She was feeling better at the time of the initial LDCT in July, but symptoms had not completely resolved.   Today, the patient states she is much improved and only coughs up clear phlegm.  She explains she has 'severe COPD' and she always has some congestion in her lungs and states she is feeling much closer to her baseline.  She did report she has a 'head cold' affecting her sinuses and she says she feels it will likely move to her chest but currently she does not have fever or symptoms related to increased cough or shortness of breath.   She reports she would 'rather not' have another CT scan and prefers to follow up with her PCP if she needs additional imaging.  She states she will likely see her PCP for her cold symptoms if this moves into her chest and she would prefer to have a CXR and not continue having CT scans.  She says 'thats not going to happen.'   Advised we would have our provider review the results and give her a call back on her recommendation.  Explained the provider is in clinic today and she  may not hear back until tomorrow. Patient was agreeable and verbalized understanding.   Will route to provider for review and follow up recommendations.SABRA

## 2023-12-26 ENCOUNTER — Other Ambulatory Visit: Payer: Self-pay | Admitting: Nurse Practitioner

## 2023-12-26 DIAGNOSIS — F064 Anxiety disorder due to known physiological condition: Secondary | ICD-10-CM | POA: Diagnosis not present

## 2023-12-26 NOTE — Telephone Encounter (Signed)
 Requesting: Albuterol  Sulfate HFA 108 (90 Base) MCG/ACT Inhalation Aerosol Solution  Last Visit: 10/01/2023 Next Visit: 01/01/2024 Last Refill: 10/23/2023  Please Advise

## 2023-12-26 NOTE — Telephone Encounter (Signed)
 I have called the patient with the results of her low dose Ct Chest. This was read as a LR 2. This is a 2 month follow up of a LR 0. The scan shows resolving infection in the area of concern.There is additional bronchiectatic changes. She has underlying COPD and emphysema.She states she has trouble with frequent upper respiratory infections . I have encouraged her to make an appointment with pulmonary for follow up. She does see cardiology and she is on statin therapy.   Please fax results to PCP and schedule 12 month follow up scan. Thanks so much

## 2023-12-27 ENCOUNTER — Other Ambulatory Visit: Payer: Self-pay

## 2023-12-27 DIAGNOSIS — Z122 Encounter for screening for malignant neoplasm of respiratory organs: Secondary | ICD-10-CM

## 2023-12-27 DIAGNOSIS — Z87891 Personal history of nicotine dependence: Secondary | ICD-10-CM

## 2023-12-27 DIAGNOSIS — F17210 Nicotine dependence, cigarettes, uncomplicated: Secondary | ICD-10-CM

## 2023-12-27 NOTE — Telephone Encounter (Signed)
 Results faxed to PCP and annual order for LDCT placed

## 2024-01-01 ENCOUNTER — Encounter: Payer: Self-pay | Admitting: Nurse Practitioner

## 2024-01-01 ENCOUNTER — Ambulatory Visit: Admitting: Nurse Practitioner

## 2024-01-01 VITALS — BP 138/94 | HR 65 | Temp 97.1°F | Ht 60.0 in | Wt 108.6 lb

## 2024-01-01 DIAGNOSIS — J441 Chronic obstructive pulmonary disease with (acute) exacerbation: Secondary | ICD-10-CM | POA: Diagnosis not present

## 2024-01-01 DIAGNOSIS — R634 Abnormal weight loss: Secondary | ICD-10-CM | POA: Diagnosis not present

## 2024-01-01 DIAGNOSIS — S61012A Laceration without foreign body of left thumb without damage to nail, initial encounter: Secondary | ICD-10-CM

## 2024-01-01 DIAGNOSIS — Z23 Encounter for immunization: Secondary | ICD-10-CM

## 2024-01-01 MED ORDER — FLUTICASONE PROPIONATE 50 MCG/ACT NA SUSP: 2.0000 | Freq: Every day | NASAL | 3 refills | Status: AC

## 2024-01-01 MED ORDER — PREDNISONE 20 MG PO TABS: 40.0000 mg | ORAL_TABLET | Freq: Every day | ORAL | 0 refills | Status: DC

## 2024-01-01 MED ORDER — HYDROXYZINE HCL 10 MG PO TABS: 10.0000 mg | ORAL_TABLET | Freq: Three times a day (TID) | ORAL | 1 refills | Status: DC | PRN

## 2024-01-01 MED ORDER — LEVOFLOXACIN 500 MG PO TABS: 500.0000 mg | ORAL_TABLET | Freq: Every day | ORAL | 0 refills | Status: DC

## 2024-01-01 NOTE — Assessment & Plan Note (Signed)
 Chronic, exacerbated. She recently had a cold which she fears turned into pneumonia. She is having shortness of breath, wheezing, productive cough, and needing to use her inhaler more often. Will have her start levaquin  500mg  daily x7 days and prednisone  40mg  daily x5 days. Continue using albuterol  inhaler and nebulizer as needed. She has not been able to tolerate long acting inhalers in the past. Lung cancer screening is up to date. Recommend tobacco cessation. Follow-up in 3 months or sooner if symptoms worsen.

## 2024-01-01 NOTE — Assessment & Plan Note (Signed)
 BMI 21.2. Most likely from COPD as she is eating regularly. Recommend she start ensure clear or boost breeze to help with intake that is dairy free.

## 2024-01-01 NOTE — Progress Notes (Signed)
 Established Patient Office Visit  Subjective   Patient ID: Erica [name], female    DOB: [birth year]  Age: 73 y.o. MRN: [id number]  Chief Complaint  Patient presents with   COPD    Update tetanus    HPI  Discussed the use of AI scribe software for clinical note transcription with the patient, who gave verbal consent to proceed.  History of Present Illness   Erica [name] is a 73 year old female who presents with respiratory symptoms and concerns about recurrent pneumonia.  She experiences labored breathing, shortness of breath, and a productive cough. She uses a nebulizer twice daily and a rescue inhaler as needed, particularly when climbing stairs. Flonase  is also part of her regimen. She frequently develops colds that progress to pneumonia, approximately five times annually, but does not experience fevers.  She takes allergy medication and anxiety medication due to stress from medical scans and concerns about lung cancer. Despite adequate food intake, she remains very thin and has tried nutritional supplements, which worsen her Crohn's disease symptoms, causing diarrhea.  She has longstanding Crohn's disease and diverticulitis. Her feet are slightly swollen, and her skin is very dry despite adequate hydration. She applies cream to manage the dryness. She recently sustained a minor cut on her finger from a knife while doing an art project, which she cleans with soap and water, and is due for a tetanus booster.        ROS See pertinent positives and negatives per HPI.    Objective:     BP (!) 138/94 (BP Location: Left Arm, Patient Position: Sitting, Cuff Size: Small)   Pulse 65   Temp (!) 97.1 F (36.2 C)   Ht 5' (1.524 m)   Wt 108 lb 9.6 oz (49.3 kg)   LMP  (LMP Unknown)   SpO2 99%   BMI 21.21 kg/m  BP Readings from Last 3 Encounters:  01/01/24 (!) 138/94  11/19/23 (!) 158/95  11/06/23 (!) 132/92   Wt Readings from Last 3 Encounters:  01/01/24 108 lb 9.6 oz  (49.3 kg)  11/19/23 107 lb (48.5 kg)  11/06/23 108 lb (49 kg)      Physical Exam Vitals and nursing note reviewed.  Constitutional:      General: She is not in acute distress.    Appearance: Normal appearance.  HENT:     Head: Normocephalic.  Eyes:     Conjunctiva/sclera: Conjunctivae normal.  Cardiovascular:     Rate and Rhythm: Normal rate and regular rhythm.     Pulses: Normal pulses.     Heart sounds: Normal heart sounds.  Pulmonary:     Effort: Pulmonary effort is normal.     Breath sounds: Wheezing present.     Comments: Shortness of breath while talking at times Musculoskeletal:     Cervical back: Normal range of motion.  Skin:    General: Skin is warm.     Comments: Healing laceration to left thumb  Neurological:     General: No focal deficit present.     Mental Status: She is alert and oriented to person, place, and time.  Psychiatric:        Mood and Affect: Mood normal.        Behavior: Behavior normal.        Thought Content: Thought content normal.        Judgment: Judgment normal.      Assessment & Plan:   Problem List Items Addressed This Visit  Respiratory   COPD exacerbation (HCC) - Primary   Chronic, exacerbated. She recently had a cold which she fears turned into pneumonia. She is having shortness of breath, wheezing, productive cough, and needing to use her inhaler more often. Will have her start levaquin  500mg  daily x7 days and prednisone  40mg  daily x5 days. Continue using albuterol  inhaler and nebulizer as needed. She has not been able to tolerate long acting inhalers in the past. Lung cancer screening is up to date. Recommend tobacco cessation. Follow-up in 3 months or sooner if symptoms worsen.       Relevant Medications   fluticasone  (FLONASE ) 50 MCG/ACT nasal spray   predniSONE  (DELTASONE ) 20 MG tablet     Other   Weight loss   BMI 21.2. Most likely from COPD as she is eating regularly. Recommend she start ensure clear or boost  breeze to help with intake that is dairy free.       Other Visit Diagnoses       Laceration of left thumb without foreign body, nail damage status unspecified, initial encounter       Healing, no signs of infection. Will update Td today.   Relevant Orders   Td vaccine greater than or equal to 7yo preservative free IM (Completed)       Return in about 3 months (around 04/01/2024) for CPE (schedule as office visit).    Erica DELENA Harada, NP

## 2024-01-01 NOTE — Patient Instructions (Signed)
 It was great to see you!  Try ensure clear or boost breeze nutritional supplements to add in calories   Start levaquin  1 tablet daily for 7 days  Start prednisone  2 tablets daily in the morning with food  Let's follow-up in 3 months, sooner if you have concerns.  If a referral was placed today, you will be contacted for an appointment. Please note that routine referrals can sometimes take up to 3-4 weeks to process. Please call our office if you haven't heard anything after this time frame.  Take care,  Tinnie Harada, NP

## 2024-01-07 ENCOUNTER — Telehealth: Payer: Self-pay | Admitting: Nurse Practitioner

## 2024-01-07 NOTE — Telephone Encounter (Signed)
 Copied from CRM [id number]. Topic: General - Other >> Jan 07, 2024  3:01 PM Taleah C wrote: Reason for CRM: pt called in to speak with [place]. She stated it was confidential. Please call & advise.

## 2024-01-07 NOTE — Telephone Encounter (Signed)
I returned patient's call and answered her questions.

## 2024-01-09 DIAGNOSIS — F064 Anxiety disorder due to known physiological condition: Secondary | ICD-10-CM | POA: Diagnosis not present

## 2024-01-16 DIAGNOSIS — F064 Anxiety disorder due to known physiological condition: Secondary | ICD-10-CM | POA: Diagnosis not present

## 2024-01-21 ENCOUNTER — Ambulatory Visit: Payer: Medicare Other

## 2024-01-21 VITALS — BP 138/80 | HR 88 | Temp 98.3°F | Ht 62.0 in | Wt 110.4 lb

## 2024-01-21 DIAGNOSIS — Z0000 Encounter for general adult medical examination without abnormal findings: Secondary | ICD-10-CM | POA: Diagnosis not present

## 2024-01-21 NOTE — Progress Notes (Signed)
 Subjective:   Erica [name] is a 73 y.o. who presents for a Medicare Wellness preventive visit.  As a reminder, Annual Wellness Visits don't include a physical exam, and some assessments may be limited, especially if this visit is performed virtually. We may recommend an in-person follow-up visit with your provider if needed.  Visit Complete: In person    Persons Participating in Visit: Patient.  AWV Questionnaire: No: Patient Medicare AWV questionnaire was not completed prior to this visit.  Cardiac Risk Factors include: advanced age (>33men, >89 women);dyslipidemia;hypertension     Objective:    Today's Vitals   01/21/24 1303 01/21/24 1304 01/21/24 1326  BP: (!) 144/80  138/80  Pulse: 88    Temp: 98.3 F (36.8 C)    TempSrc: Oral    SpO2: 91%    Weight: 110 lb 6.4 oz (50.1 kg)    Height: 5' 2 (1.575 m)    PainSc:  8     Body mass index is 20.19 kg/m.     01/21/2024    1:14 PM 01/19/2023    1:39 PM 12/28/2022   12:00 AM 11/14/2021    9:20 AM 01/05/2021    4:09 PM 09/05/2019   10:31 AM 04/24/2018    8:33 PM  Advanced Directives  Does Patient Have a Medical Advance Directive? Yes Yes Yes Yes No No No   Type of Estate agent of [place];Living will Healthcare Power of [place];Living will Living will Healthcare Power of [place];Living will     Does patient want to make changes to medical advance directive?   No - Patient declined      Copy of Healthcare Power of Attorney in Chart? Yes - validated most recent copy scanned in chart (See row information) Yes - validated most recent copy scanned in chart (See row information)  No - copy requested     Would patient like information on creating a medical advance directive?     No - Patient declined No - Patient declined No - Patient declined      Data saved with a previous flowsheet row definition    Current Medications (verified) Outpatient Encounter Medications as of 01/21/2024  Medication Sig    albuterol  (PROVENTIL ) (2.5 MG/3ML) 0.083% nebulizer solution Take 3 mLs (2.5 mg total) by nebulization every 6 (six) hours as needed for wheezing or shortness of breath.   albuterol  (VENTOLIN  HFA) 108 (90 Base) MCG/ACT inhaler INHALE 2 PUFFS BY MOUTH EVERY 4 HOURS AS NEEDED FOR WHEEZING FOR SHORTNESS OF BREATH   aspirin  EC 81 MG tablet Take 81 mg by mouth at bedtime.   cyclobenzaprine  (FLEXERIL ) 10 MG tablet Take 1 tablet by mouth three times daily as needed for muscle spasm (Patient taking differently: Take 10 mg by mouth as needed. for muscle spams)   fluticasone  (FLONASE ) 50 MCG/ACT nasal spray Place 2 sprays into both nostrils daily.   HYDROcodone -acetaminophen  (NORCO/VICODIN) 5-325 MG tablet Take 1 tablet by mouth in the morning and at bedtime.   hydrOXYzine  (ATARAX ) 10 MG tablet Take 1 tablet (10 mg total) by mouth 3 (three) times daily as needed. for anxiety   metoprolol  tartrate (LOPRESSOR ) 25 MG tablet Take 1 tablet (25 mg total) by mouth 2 (two) times daily.   rosuvastatin  (CRESTOR ) 10 MG tablet Take 1 tablet (10 mg total) by mouth every Monday, Wednesday, and Friday.   Vitamin D , Ergocalciferol , (DRISDOL ) 1.25 MG (50000 UNIT) CAPS capsule Take 1 capsule by mouth once a week   [START ON  01/30/2024] HYDROcodone -acetaminophen  (NORCO/VICODIN) 5-325 MG tablet Take 1 tablet by mouth in the morning and at bedtime. (Patient not taking: Reported on 01/01/2024)   levofloxacin  (LEVAQUIN ) 500 MG tablet Take 1 tablet (500 mg total) by mouth daily. (Patient not taking: Reported on 01/21/2024)   predniSONE  (DELTASONE ) 20 MG tablet Take 2 tablets (40 mg total) by mouth daily with breakfast. (Patient not taking: Reported on 01/21/2024)   No facility-administered encounter medications on file as of 01/21/2024.    Allergies (verified) Iodinated contrast media and Morphine   History: Past Medical History:  Diagnosis Date   Acute duodenitis 04/24/2017   Acute hypoxic respiratory failure (HCC)  12/27/2022   Allergy    ANEMIA    Anxiety    BACK PAIN, LUMBAR    Cancer of ascending colon [id number] s/p colectomy 03/05/2009 02/18/2010   Qualifier: Diagnosis of  By: Avram MD, NOLIA Pitts E    CHF (congestive heart failure) (HCC)    Complete rotator cuff tear of left shoulder 11/27/2013   Ultrasound guided injection on November 27, 2013    COPD (chronic obstructive pulmonary disease) with emphysema (HCC)    COPD exacerbation (HCC) 11/14/2017   Crohn's 02/2010   ileal ulcers, intol of entercort--refuses treatment   Emphysema of lung (HCC)    GERD    History of blood transfusion    w/ c/s surgery   History of transfusion of whole blood    HYPERLIPIDEMIA    HYPERTENSION    Microscopic hematuria    chronic   Myocardial infarction (HCC) 2010   Narcotic dependence (HCC) 05/25/2017   Obstruction of intestine or colon (HCC)    adhesions   OSTEOARTHRITIS    Pneumonia due to infectious organism 09/18/2014   Onset of symptoms 07/2014   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  - FOB 12/18/14 > min narrowing > BAL: cytology negative   - f/u cxr 03/22/2015 improved > f/u CT abd 06/28/2015          Rectal fissure    Possible fissure   Rotator cuff tear    SBO (small bowel obstruction) (HCC) 02/14/2017   Takotsubo syndrome 12/2008   URINARY INCONTINENCE    Past Surgical History:  Procedure Laterality Date   ABDOMINAL HYSTERECTOMY  1994   APPENDECTOMY  1964   BLADDER SUSPENSION     CESAREAN SECTION     x 3   CHOLECYSTECTOMY  1995   COLONOSCOPY  2017   COLONOSCOPY W/ BIOPSIES AND POLYPECTOMY  01/24/2011   (Crohn's)ileitis, internal hemorrhoids   ECTOPIC PREGNANCY SURGERY     ESOPHAGOGASTRODUODENOSCOPY     FACIAL COSMETIC SURGERY     HAND SURGERY Bilateral 1991   x 2   KNEE SURGERY Right 1981   LYSIS OF ADHESION  2010   ex lap/LOA for SBO Dr Mikell   ORBITAL FRACTURE SURGERY  1990   RIGHT COLECTOMY  03/2009   Right colectomy for colon CA.  Dr Ebbie   TONSILLECTOMY  1952   TOTAL  KNEE ARTHROPLASTY  03/2010   Dr Ernie.  Depuy   UPPER GASTROINTESTINAL ENDOSCOPY  05/16/2010   normal   UTERINE SUSPENSION     VIDEO BRONCHOSCOPY Bilateral 12/18/2014   Procedure: VIDEO BRONCHOSCOPY WITHOUT FLUORO;  Surgeon: Ozell KATHEE America, MD;  Location: [facility];  Service: Cardiopulmonary;  Laterality: Bilateral;   Family History  Problem Relation Age of Onset   Throat cancer Mother    Colon cancer Father 2   Hypertension Father  Heart disease Father    Kidney disease Father    Colon cancer Sister 45   Liver cancer Sister    Other Sister        amyloidosis   Arthritis Other        Parent, other relative   Stomach cancer Neg Hx    Rectal cancer Neg Hx    Breast cancer Neg Hx    Social History   Socioeconomic History   Marital status: Widowed    Spouse name: Not on file   Number of children: 5   Years of education: 14   Highest education level: High school graduate  Occupational History   Occupation: Retired     Associate Professor: UNEMPLOYED  Tobacco Use   Smoking status: Every Day    Current packs/day: 1.00    Average packs/day: 1 pack/day for 53.0 years (53.0 ttl pk-yrs)    Types: Cigarettes    Passive exposure: Never   Smokeless tobacco: Never   Tobacco comments:    Smokes 0.5 packs of cigarettes daily ARJ 08/22/22  Vaping Use   Vaping status: Never Used  Substance and Sexual Activity   Alcohol use: Not Currently    Comment: occasional mixed drink   Drug use: Yes    Types: Hydrocodone    Sexual activity: Not Currently    Birth control/protection: Surgical    Comment: Hysterectomy  Other Topics Concern   Not on file  Social History Narrative   Patient is a widow.   Lives in an Apt. 1 son lives in [place] the other 4 adult children live in [place] .   Continues to smoke daily, occasional alcohol no drug use   Social Drivers of Corporate investment banker Strain: Low Risk  (01/21/2024)   Overall Financial Resource Strain (CARDIA)    Difficulty of Paying  Living Expenses: Not hard at all  Food Insecurity: No Food Insecurity (01/21/2024)   Hunger Vital Sign    Worried About Running Out of Food in the Last Year: Never true    Ran Out of Food in the Last Year: Never true  Transportation Needs: No Transportation Needs (01/21/2024)   PRAPARE - Administrator, Civil Service (Medical): No    Lack of Transportation (Non-Medical): No  Physical Activity: Inactive (01/21/2024)   Exercise Vital Sign    Days of Exercise per Week: 0 days    Minutes of Exercise per Session: 0 min  Stress: No Stress Concern Present (01/21/2024)   [employer] of Occupational Health - Occupational Stress Questionnaire    Feeling of Stress: Not at all  Social Connections: Moderately Isolated (01/21/2024)   Social Connection and Isolation Panel    Frequency of Communication with Friends and Family: More than three times a week    Frequency of Social Gatherings with Friends and Family: More than three times a week    Attends Religious Services: More than 4 times per year    Active Member of [employer] or Organizations: No    Attends Banker Meetings: Never    Marital Status: Widowed    Tobacco Counseling Ready to quit: Not Answered Counseling given: Not Answered Tobacco comments: Smokes 0.5 packs of cigarettes daily ARJ 08/22/22    Clinical Intake:  Pre-visit preparation completed: Yes  Pain : 0-10 Pain Score: 8  Pain Type: Chronic pain Pain Location: Back Pain Orientation: Lower Pain Descriptors / Indicators: Aching Pain Onset: More than a month ago Pain Frequency: Constant     Nutritional  Status: BMI of 19-24  Normal Nutritional Risks: None Diabetes: No  Lab Results  Component Value Date   HGBA1C 5.9 12/25/2022   HGBA1C 5.9 06/22/2022   HGBA1C 5.9 08/30/2021     How often do you need to have someone help you when you read instructions, pamphlets, or other written materials from your doctor or pharmacy?: 1 -  Never  Interpreter Needed?: No  Information entered by :: NAllen LPN   Activities of Daily Living     01/21/2024    1:05 PM  In your present state of health, do you have any difficulty performing the following activities:  Hearing? 0  Vision? 0  Difficulty concentrating or making decisions? 0  Walking or climbing stairs? 0  Dressing or bathing? 0  Doing errands, shopping? 0  Preparing Food and eating ? N  Using the Toilet? N  In the past six months, have you accidently leaked urine? N  Do you have problems with loss of bowel control? N  Managing your Medications? N  Managing your Finances? N  Housekeeping or managing your Housekeeping? N    Patient Care Team: Nedra Tinnie LABOR, NP as PCP - General (Internal Medicine) Santo Stanly LABOR, MD as PCP - Cardiology (Cardiology) Avram Lupita BRAVO, MD as Consulting Physician (Gastroenterology) Ernie Cough, MD as Consulting Physician (Orthopedic Surgery) Burnetta Aures, MD as Consulting Physician (Orthopedic Surgery) Colon Shove, MD as Consulting Physician (Neurosurgery) Claudene Arthea HERO, DO as Consulting Physician (Pain Medicine) Dolphus Reiter, MD as Consulting Physician (Rheumatology) Darlean Ozell NOVAK, MD as Consulting Physician (Pulmonary Disease) Fate Morna SAILOR, [facility] (Inactive) as Pharmacist (Pharmacist)  I have updated your Care Teams any recent Medical Services you may have received from other providers in the past year.     Assessment:   This is a routine wellness examination for Shyrl.  Hearing/Vision screen Hearing Screening - Comments:: Denies hearing issues Vision Screening - Comments:: No regular eye exams   Goals Addressed             This Visit's Progress    Patient Stated       01/21/2024, be more careful about getting sick       Depression Screen     01/21/2024    1:16 PM 11/19/2023    9:09 AM 08/21/2023    8:43 AM 08/21/2023    8:41 AM 05/28/2023    9:11 AM 02/28/2023    9:10  AM 01/19/2023    1:41 PM  PHQ 2/9 Scores  PHQ - 2 Score 0 0 0 0 0 0 0  PHQ- 9 Score  0 2 2       Fall Risk     01/21/2024    1:15 PM 11/19/2023    9:09 AM 08/21/2023    8:41 AM 05/28/2023    9:11 AM 02/28/2023    9:10 AM  Fall Risk   Falls in the past year? 0 0 0 0 0  Number falls in past yr: 0 0  0   Injury with Fall? 0 0     Risk for fall due to : Medication side effect      Follow up Falls evaluation completed;Falls prevention discussed        MEDICARE RISK AT HOME:  Medicare Risk at Home Any stairs in or around the home?: Yes If so, are there any without handrails?: No Home free of loose throw rugs in walkways, pet beds, electrical cords, etc?: Yes Adequate lighting in your home to  reduce risk of falls?: Yes Life alert?: No Use of a cane, walker or w/c?: No Grab bars in the bathroom?: Yes Shower chair or bench in shower?: No Elevated toilet seat or a handicapped toilet?: No  TIMED UP AND GO:  Was the test performed?  Yes  Length of time to ambulate 10 feet: 5 sec Gait steady and fast without use of assistive device  Cognitive Function: 6CIT completed        01/21/2024    1:16 PM 01/19/2023    1:41 PM  6CIT Screen  What Year? 0 points 0 points  What month? 0 points 0 points  What time? 0 points 0 points  Count back from 20 0 points 0 points  Months in reverse 0 points 0 points  Repeat phrase 0 points 0 points  Total Score 0 points 0 points    Immunizations Immunization History  Administered Date(s) Administered   Fluad Quad(high Dose 65+) 12/05/2018, 03/23/2022   Fluad Trivalent(High Dose 65+) 12/25/2022   INFLUENZA, HIGH DOSE SEASONAL PF 02/20/2017, 02/08/2018, 12/02/2023   Influenza Split 01/06/2009, 02/27/2011   Influenza Whole 02/18/2010   Influenza,inj,Quad PF,6+ Mos 01/22/2013   PFIZER(Purple Top)SARS-COV-2 Vaccination 07/10/2019, 08/04/2019, 03/13/2020, 11/25/2020   Pneumococcal Conjugate-13 03/22/2015   Pneumococcal Polysaccharide-23  04/03/2008, 09/18/2016   Respiratory Syncytial Virus Vaccine,Recomb Aduvanted(Arexvy) 08/15/2023   Td 04/03/2004, 01/01/2024   Tdap 09/17/2014   Zoster Recombinant(Shingrix) 09/26/2023, 12/02/2023   Zoster, Live 11/18/2010    Screening Tests Health Maintenance  Topic Date Due   Hepatitis C Screening  Never done   COVID-19 Vaccine (5 - 2025-26 season) 12/03/2023   Mammogram  08/20/2024   Lung Cancer Screening  12/17/2024   Medicare Annual Wellness (AWV)  01/20/2025   Colonoscopy  10/03/2025   DTaP/Tdap/Td (4 - Td or Tdap) 12/31/2033   Pneumococcal Vaccine: 50+ Years  Completed   Influenza Vaccine  Completed   DEXA SCAN  Completed   Zoster Vaccines- Shingrix  Completed   Meningococcal B Vaccine  Aged Out    Health Maintenance Items Addressed: Declines covid vaccine. Patient to schedule mammogram.  Additional Screening:  Vision Screening: Recommended annual ophthalmology exams for early detection of glaucoma and other disorders of the eye. Is the patient up to date with their annual eye exam?  No  Who is the provider or what is the name of the office in which the patient attends annual eye exams? none  Dental Screening: Recommended annual dental exams for proper oral hygiene  Community Resource Referral / Chronic Care Management: CRR required this visit?  No   CCM required this visit?  No   Plan:    I have personally reviewed and noted the following in the patient's chart:   Medical and social history Use of alcohol, tobacco or illicit drugs  Current medications and supplements including opioid prescriptions. Patient is currently taking opioid prescriptions. Information provided to patient regarding non-opioid alternatives. Patient advised to discuss non-opioid treatment plan with their provider. Functional ability and status Nutritional status Physical activity Advanced directives List of other physicians Hospitalizations, surgeries, and ER visits in previous 12  months Vitals Screenings to include cognitive, depression, and falls Referrals and appointments  In addition, I have reviewed and discussed with patient certain preventive protocols, quality metrics, and best practice recommendations. A written personalized care plan for preventive services as well as general preventive health recommendations were provided to patient.   Ardella FORBES Dawn, LPN   [id number]   After Visit Summary: (In Person-Printed) AVS  printed and given to the patient  Notes: Nothing significant to report at this time.

## 2024-01-21 NOTE — Patient Instructions (Signed)
 Erica [name],  Thank you for taking the time for your Medicare Wellness Visit. I appreciate your continued commitment to your health goals. Please review the care plan we discussed, and feel free to reach out if I can assist you further.  Medicare recommends these wellness visits once per year to help you and your care team stay ahead of potential health issues. These visits are designed to focus on prevention, allowing your provider to concentrate on managing your acute and chronic conditions during your regular appointments.  Please note that Annual Wellness Visits do not include a physical exam. Some assessments may be limited, especially if the visit was conducted virtually. If needed, we may recommend a separate in-person follow-up with your provider.  Ongoing Care Seeing your primary care provider every 3 to 6 months helps us  monitor your health and provide consistent, personalized care.   Referrals If a referral was made during today's visit and you haven't received any updates within two weeks, please contact the referred provider directly to check on the status.  Recommended Screenings:  Health Maintenance  Topic Date Due   Hepatitis C Screening  Never done   COVID-19 Vaccine (5 - 2025-26 season) 12/03/2023   Breast Cancer Screening  08/20/2024   Screening for Lung Cancer  12/17/2024   Medicare Annual Wellness Visit  01/20/2025   Colon Cancer Screening  10/03/2025   DTaP/Tdap/Td vaccine (4 - Td or Tdap) 12/31/2033   Pneumococcal Vaccine for age over 10  Completed   Flu Shot  Completed   DEXA scan (bone density measurement)  Completed   Zoster (Shingles) Vaccine  Completed   Meningitis B Vaccine  Aged Out       01/21/2024    1:14 PM  Advanced Directives  Does Patient Have a Medical Advance Directive? Yes  Type of Estate agent of [place];Living will  Copy of Healthcare Power of Attorney in Chart? Yes - validated most recent copy scanned in chart (See  row information)   Advance Care Planning is important because it: Ensures you receive medical care that aligns with your values, goals, and preferences. Provides guidance to your family and loved ones, reducing the emotional burden of decision-making during critical moments.  Vision: Annual vision screenings are recommended for early detection of glaucoma, cataracts, and diabetic retinopathy. These exams can also reveal signs of chronic conditions such as diabetes and high blood pressure.  Dental: Annual dental screenings help detect early signs of oral cancer, gum disease, and other conditions linked to overall health, including heart disease and diabetes.  Please see the attached documents for additional preventive care recommendations.

## 2024-01-23 DIAGNOSIS — F064 Anxiety disorder due to known physiological condition: Secondary | ICD-10-CM | POA: Diagnosis not present

## 2024-01-24 ENCOUNTER — Other Ambulatory Visit: Payer: Self-pay | Admitting: Nurse Practitioner

## 2024-01-24 DIAGNOSIS — J441 Chronic obstructive pulmonary disease with (acute) exacerbation: Secondary | ICD-10-CM

## 2024-01-24 NOTE — Telephone Encounter (Signed)
 Pts Ventolin  rx has been sent to the pharmacy. Pts last refill was on 12/26/2023. She is due. Her last OV was on 01/01/2024 and her future OV is scheduled for 04/07/2024.

## 2024-02-05 NOTE — Progress Notes (Addendum)
 [name] [address] [place] [address] Phone: [phone] Subjective:   Erica [name], am serving as a scribe for Dr. Arthea Claudene.  I'm seeing this patient by the request  of:  McElwee, [name], NP  CC: Multiple joint complaints  YEP:Dlagzrupcz  11/06/2023 Discussed with patient about icing regimen of home exercises, which activities to do in which ones to avoid. Increase activity slowly. Wants to avoid any surgical intervention and would be relatively high risk anyhow. Continue to monitor. Will continue to see how patient does with her tobacco abuse. Continues to lose weight and is following up with primary care in the near future   Repeat injection given today, tolerated the procedure well, discussed icing regimen, discussed avoiding certain activities.  Avoid crossover motions.  Avoid heavy lifting.  No worsening pain can repeat injections every 3 months if needed     Injection given today and tolerated the procedure well, discussed icing regimen and home exercises, discussed with patient about potential bracing at night.  Still seems to be responding relatively well to the injections.  Patient wants to avoid any surgical intervention.     Update 02/06/2024 Erica [name] is a 73 y.o. female coming in with complaint of R middle finger and R shoulder pain. Patient states injection do help. Feels like it's time for another round.      Past Medical History:  Diagnosis Date   Acute duodenitis 04/24/2017   Acute hypoxic respiratory failure (HCC) 12/27/2022   Allergy    ANEMIA    Anxiety    BACK PAIN, LUMBAR    Cancer of ascending colon pTispN0 s/p colectomy 03/05/2009 02/18/2010   Qualifier: Diagnosis of  By: Avram MD, NOLIA Pitts E    CHF (congestive heart failure) (HCC)    Complete rotator cuff tear of left shoulder 11/27/2013   Ultrasound guided injection on November 27, 2013    COPD (chronic obstructive pulmonary disease) with  emphysema (HCC)    COPD exacerbation (HCC) 11/14/2017   Crohn's 02/2010   ileal ulcers, intol of entercort--refuses treatment   Emphysema of lung (HCC)    GERD    History of blood transfusion    w/ c/s surgery   History of transfusion of whole blood    HYPERLIPIDEMIA    HYPERTENSION    Microscopic hematuria    chronic   Myocardial infarction (HCC) 2010   Narcotic dependence (HCC) 05/25/2017   Obstruction of intestine or colon (HCC)    adhesions   OSTEOARTHRITIS    Pneumonia due to infectious organism 09/18/2014   Onset of symptoms 07/2014   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  - FOB 12/18/14 > min narrowing > BAL: cytology negative   - f/u cxr 03/22/2015 improved > f/u CT abd 06/28/2015          Rectal fissure    Possible fissure   Rotator cuff tear    SBO (small bowel obstruction) (HCC) 02/14/2017   Takotsubo syndrome 12/2008   URINARY INCONTINENCE    Past Surgical History:  Procedure Laterality Date   ABDOMINAL HYSTERECTOMY  1994   APPENDECTOMY  1964   BLADDER SUSPENSION     CESAREAN SECTION     x 3   CHOLECYSTECTOMY  1995   COLONOSCOPY  2017   COLONOSCOPY W/ BIOPSIES AND POLYPECTOMY  01/24/2011   (Crohn's)ileitis, internal hemorrhoids   ECTOPIC PREGNANCY SURGERY     ESOPHAGOGASTRODUODENOSCOPY     FACIAL COSMETIC SURGERY  HAND SURGERY Bilateral 1991   x 2   KNEE SURGERY Right 1981   LYSIS OF ADHESION  2010   ex lap/LOA for SBO Dr Mikell   ORBITAL FRACTURE SURGERY  1990   RIGHT COLECTOMY  03/2009   Right colectomy for colon CA.  Dr Ebbie   TONSILLECTOMY  1952   TOTAL KNEE ARTHROPLASTY  03/2010   Dr Ernie.  Depuy   UPPER GASTROINTESTINAL ENDOSCOPY  05/16/2010   normal   UTERINE SUSPENSION     VIDEO BRONCHOSCOPY Bilateral 12/18/2014   Procedure: VIDEO BRONCHOSCOPY WITHOUT FLUORO;  Surgeon: Ozell KATHEE America, MD;  Location: [facility];  Service: Cardiopulmonary;  Laterality: Bilateral;   Social History   Socioeconomic History   Marital status: Widowed     Spouse name: Not on file   Number of children: 5   Years of education: 14   Highest education level: High school graduate  Occupational History   Occupation: Retired     Associate Professor: UNEMPLOYED  Tobacco Use   Smoking status: Every Day    Current packs/day: 1.00    Average packs/day: 1 pack/day for 53.0 years (53.0 ttl pk-yrs)    Types: Cigarettes    Passive exposure: Never   Smokeless tobacco: Never   Tobacco comments:    Smokes 0.5 packs of cigarettes daily ARJ 08/22/22  Vaping Use   Vaping status: Never Used  Substance and Sexual Activity   Alcohol use: Not Currently    Comment: occasional mixed drink   Drug use: Yes    Types: Hydrocodone    Sexual activity: Not Currently    Birth control/protection: Surgical    Comment: Hysterectomy  Other Topics Concern   Not on file  Social History Narrative   Patient is a widow.   Lives in an Apt. 1 son lives in [place] the other 4 adult children live in [place] .   Continues to smoke daily, occasional alcohol no drug use   Social Drivers of Corporate Investment Banker Strain: Low Risk  (01/21/2024)   Overall Financial Resource Strain (CARDIA)    Difficulty of Paying Living Expenses: Not hard at all  Food Insecurity: No Food Insecurity (01/21/2024)   Hunger Vital Sign    Worried About Running Out of Food in the Last Year: Never true    Ran Out of Food in the Last Year: Never true  Transportation Needs: No Transportation Needs (01/21/2024)   PRAPARE - Administrator, Civil Service (Medical): No    Lack of Transportation (Non-Medical): No  Physical Activity: Inactive (01/21/2024)   Exercise Vital Sign    Days of Exercise per Week: 0 days    Minutes of Exercise per Session: 0 min  Stress: No Stress Concern Present (01/21/2024)   [employer] of Occupational Health - Occupational Stress Questionnaire    Feeling of Stress: Not at all  Social Connections: Moderately Isolated (01/21/2024)   Social Connection and  Isolation Panel    Frequency of Communication with Friends and Family: More than three times a week    Frequency of Social Gatherings with Friends and Family: More than three times a week    Attends Religious Services: More than 4 times per year    Active Member of [employer] or Organizations: No    Attends Banker Meetings: Never    Marital Status: Widowed   Allergies  Allergen Reactions   Iodinated Contrast Media Itching and Other (See Comments)    She had some itching after lumbar  epidural injection in ipsilateral lower extremity 12/07/22.  Today, she had itching in her ipsilateral upper extremity after a cervical epidural injection.(12/21/2022)   Morphine Nausea And Vomiting   Family History  Problem Relation Age of Onset   Throat cancer Mother    Colon cancer Father 72   Hypertension Father    Heart disease Father    Kidney disease Father    Colon cancer Sister 79   Liver cancer Sister    Other Sister        amyloidosis   Arthritis Other        Parent, other relative   Stomach cancer Neg Hx    Rectal cancer Neg Hx    Breast cancer Neg Hx     Current Outpatient Medications (Endocrine & Metabolic):    predniSONE  (DELTASONE ) 20 MG tablet, Take 2 tablets (40 mg total) by mouth daily with breakfast. (Patient not taking: Reported on 01/21/2024)  Current Outpatient Medications (Cardiovascular):    metoprolol  tartrate (LOPRESSOR ) 25 MG tablet, Take 1 tablet (25 mg total) by mouth 2 (two) times daily.   rosuvastatin  (CRESTOR ) 10 MG tablet, Take 1 tablet (10 mg total) by mouth every Monday, Wednesday, and Friday.  Current Outpatient Medications (Respiratory):    albuterol  (PROVENTIL ) (2.5 MG/3ML) 0.083% nebulizer solution, Take 3 mLs (2.5 mg total) by nebulization every 6 (six) hours as needed for wheezing or shortness of breath.   albuterol  (VENTOLIN  HFA) 108 (90 Base) MCG/ACT inhaler, INHALE 2 PUFFS BY MOUTH EVERY 4 HOURS AS NEEDED FOR WHEEZING FOR SHORTNESS OF BREATH    fluticasone  (FLONASE ) 50 MCG/ACT nasal spray, Place 2 sprays into both nostrils daily.  Current Outpatient Medications (Analgesics):    aspirin  EC 81 MG tablet, Take 81 mg by mouth at bedtime.   HYDROcodone -acetaminophen  (NORCO/VICODIN) 5-325 MG tablet, Take 1 tablet by mouth in the morning and at bedtime. (Patient not taking: Reported on 01/01/2024)   Current Outpatient Medications (Other):    cyclobenzaprine  (FLEXERIL ) 10 MG tablet, Take 1 tablet by mouth three times daily as needed for muscle spasm (Patient taking differently: Take 10 mg by mouth as needed. for muscle spams)   hydrOXYzine  (ATARAX ) 10 MG tablet, Take 1 tablet (10 mg total) by mouth 3 (three) times daily as needed. for anxiety   levofloxacin  (LEVAQUIN ) 500 MG tablet, Take 1 tablet (500 mg total) by mouth daily. (Patient not taking: Reported on 01/21/2024)   Vitamin D , Ergocalciferol , (DRISDOL ) 1.25 MG (50000 UNIT) CAPS capsule, Take 1 capsule by mouth once a week   Reviewed prior external information including notes and imaging from  primary care provider As well as notes that were available from care everywhere and other healthcare systems.  Past medical history, social, surgical and family history all reviewed in electronic medical record.  No pertanent information unless stated regarding to the chief complaint.   Review of Systems:  No headache, visual changes, nausea, vomiting, diarrhea, constipation, dizziness, abdominal pain, skin rash, fevers, chills, night sweats, weight loss, swollen lymph nodes, body aches, joint swelling, chest pain, shortness of breath, mood changes. POSITIVE muscle aches  Objective  Blood pressure (!) 140/96, pulse 86, height 5' 2 (1.575 m), weight 112 lb (50.8 kg), SpO2 91%.   General: No apparent distress alert and oriented x3 mood and affect normal, dressed appropriately.  HEENT: Pupils equal, extraocular movements intact  Respiratory: Patient's speak in full sentences and does not appear  short of breath  Cardiovascular: No lower extremity edema, non tender, no erythema  Hand  exam shows patient does have a trigger nodule noted at the A2 pulley on the right middle finger.  Tender to palpation in this area.  Triggering also noted today.  Shoulder exam shows atrophy of the musculature noted.  Tenderness to palpation diffusely.  Does have weakness of the rotator cuff.  Left knee effusion noted.  Lacks last 5 degrees of flexion.  After informed written and verbal consent, patient was seated on exam table. Left knee was prepped with alcohol swab and utilizing anterolateral approach, patient's left knee space was injected with 4:1  marcaine 0.5%: Kenalog  40mg /dL. Patient tolerated the procedure well without immediate complications.  After informed written and verbal consent, patient was seated on exam table. Right shoulder was prepped with alcohol swab and utilizing posterior approach, patient's right glenohumeral space was injected with 4:1  marcaine 0.5%: Kenalog  40mg /dL. Patient tolerated the procedure well without immediate complications.  After verbal consent patient was prepped with alcohol swab and with a 25-gauge half inch needle injected with 0.5 cc 0.5% Marcaine and 0.5 cc of Kenalog  40 mg/mL into the right acromioclavicular joint.  No blood loss.  Band-Aid placed.  Postinjection instruction given.  After verbal consent patient was prepped with alcohol swab and near the A2 pulley of the right third flexor tendon sheath patient was injected with 0.5 cc of 0.5% Marcaine and 0.5 cc of Kenalog  40 mg/mL.  No blood loss.  Band-Aid placed.  Postinjection instruction given    Impression and Recommendations:    The above documentation has been reviewed and is accurate and complete Ruthanne Mcneish M Jadamarie Butson, DO

## 2024-02-06 ENCOUNTER — Ambulatory Visit (INDEPENDENT_AMBULATORY_CARE_PROVIDER_SITE_OTHER): Admitting: Family Medicine

## 2024-02-06 ENCOUNTER — Encounter: Payer: Self-pay | Admitting: Family Medicine

## 2024-02-06 VITALS — BP 140/96 | HR 86 | Ht 62.0 in | Wt 112.0 lb

## 2024-02-06 DIAGNOSIS — M19011 Primary osteoarthritis, right shoulder: Secondary | ICD-10-CM | POA: Diagnosis not present

## 2024-02-06 DIAGNOSIS — M65331 Trigger finger, right middle finger: Secondary | ICD-10-CM | POA: Diagnosis not present

## 2024-02-06 DIAGNOSIS — M75101 Unspecified rotator cuff tear or rupture of right shoulder, not specified as traumatic: Secondary | ICD-10-CM | POA: Diagnosis not present

## 2024-02-06 DIAGNOSIS — F064 Anxiety disorder due to known physiological condition: Secondary | ICD-10-CM | POA: Diagnosis not present

## 2024-02-06 DIAGNOSIS — M12811 Other specific arthropathies, not elsewhere classified, right shoulder: Secondary | ICD-10-CM

## 2024-02-06 DIAGNOSIS — M1712 Unilateral primary osteoarthritis, left knee: Secondary | ICD-10-CM

## 2024-02-06 NOTE — Assessment & Plan Note (Signed)
 Chronic problem with exacerbation but does respond every 3 months.  Patient still wants to avoid any surgical intervention.  Hopeful of this.  Patient is a relatively high risk surgical patient secondary to comorbidities including pulmonary disease.  Discussed with patient that this is a in office procedure as well if needed.  Follow-up with me again in 3 weeks

## 2024-02-06 NOTE — Patient Instructions (Addendum)
 Injection in shoulder, finger, and knee Good to see you! See you again in 3 months  PCP for son: Emil Schaumann or Mabel Pry

## 2024-02-06 NOTE — Assessment & Plan Note (Signed)
 Chronic problem with exacerbation.  Has responded well to injections in the past.  Hopeful that this will make a difference again.  Allow her to still be independent.  Considering to move in with her son in the relatively near future.  Follow-up with me again in 2 to 3 months.

## 2024-02-13 DIAGNOSIS — F064 Anxiety disorder due to known physiological condition: Secondary | ICD-10-CM | POA: Diagnosis not present

## 2024-02-21 ENCOUNTER — Other Ambulatory Visit: Payer: Self-pay | Admitting: Nurse Practitioner

## 2024-02-21 ENCOUNTER — Encounter: Attending: Physical Medicine and Rehabilitation | Admitting: Registered Nurse

## 2024-02-21 VITALS — BP 198/95 | HR 69 | Ht 62.0 in | Wt 111.0 lb

## 2024-02-21 DIAGNOSIS — Z5181 Encounter for therapeutic drug level monitoring: Secondary | ICD-10-CM | POA: Diagnosis not present

## 2024-02-21 DIAGNOSIS — M25562 Pain in left knee: Secondary | ICD-10-CM | POA: Diagnosis not present

## 2024-02-21 DIAGNOSIS — I10 Essential (primary) hypertension: Secondary | ICD-10-CM | POA: Diagnosis not present

## 2024-02-21 DIAGNOSIS — G8929 Other chronic pain: Secondary | ICD-10-CM | POA: Diagnosis not present

## 2024-02-21 DIAGNOSIS — M5416 Radiculopathy, lumbar region: Secondary | ICD-10-CM | POA: Diagnosis not present

## 2024-02-21 DIAGNOSIS — M25512 Pain in left shoulder: Secondary | ICD-10-CM | POA: Insufficient documentation

## 2024-02-21 DIAGNOSIS — M25511 Pain in right shoulder: Secondary | ICD-10-CM | POA: Diagnosis not present

## 2024-02-21 DIAGNOSIS — M25561 Pain in right knee: Secondary | ICD-10-CM | POA: Insufficient documentation

## 2024-02-21 DIAGNOSIS — G894 Chronic pain syndrome: Secondary | ICD-10-CM | POA: Diagnosis not present

## 2024-02-21 MED ORDER — HYDROCODONE-ACETAMINOPHEN 5-325 MG PO TABS
1.0000 | ORAL_TABLET | Freq: Two times a day (BID) | ORAL | 0 refills | Status: DC
Start: 2024-02-21 — End: 2024-02-21

## 2024-02-21 MED ORDER — HYDROCODONE-ACETAMINOPHEN 5-325 MG PO TABS
1.0000 | ORAL_TABLET | Freq: Two times a day (BID) | ORAL | 0 refills | Status: AC
Start: 2024-02-21 — End: 2024-03-22

## 2024-02-21 NOTE — Progress Notes (Signed)
 Subjective:    Patient ID: Erica [name], female    DOB: 1951/03/28, 73 y.o.   MRN: [id number]  HPI: Erica [name] is a 73 y.o. female who returns for follow up appointment for chronic pain and medication refill. She states her pain is located in her bilateral shoulders R>L, lower back radiating into her bilateral lower extremities and bilateral knee pain. She rates her pain 7. Her current exercise regime is walking and performing stretching exercises.  Erica [name] arrive with uncontrolled hypertension, she reports she didn't take her anti-hypertensive medication today. She was educated on medication compliance, she verbalizes understanding, she refuses ED or Urgent Care evaluation.   Erica [name] Morphine equivalent is 10.00 MME.   Last UDS was Performed on 11/19/2023, it was consistent.     Pain Inventory Average Pain 8 Pain Right Now 7 My pain is sharp  In the last 24 hours, has pain interfered with the following? General activity 7 Relation with others 0 Enjoyment of life 0 What TIME of day is your pain at its worst? morning  and evening Sleep (in general) Fair  Pain is worse with: walking and bending Pain improves with: medication Relief from Meds: 7  Family History  Problem Relation Age of Onset   Throat cancer Mother    Colon cancer Father 5   Hypertension Father    Heart disease Father    Kidney disease Father    Colon cancer Sister 8   Liver cancer Sister    Other Sister        amyloidosis   Arthritis Other        Parent, other relative   Stomach cancer Neg Hx    Rectal cancer Neg Hx    Breast cancer Neg Hx    Social History   Socioeconomic History   Marital status: Widowed    Spouse name: Not on file   Number of children: 5   Years of education: 14   Highest education level: High school graduate  Occupational History   Occupation: Retired     Associate Professor: UNEMPLOYED  Tobacco Use   Smoking status: Every Day    Current packs/day: 1.00     Average packs/day: 1 pack/day for 53.0 years (53.0 ttl pk-yrs)    Types: Cigarettes    Passive exposure: Never   Smokeless tobacco: Never   Tobacco comments:    Smokes 0.5 packs of cigarettes daily ARJ 08/22/22  Vaping Use   Vaping status: Never Used  Substance and Sexual Activity   Alcohol use: Not Currently    Comment: occasional mixed drink   Drug use: Yes    Types: Hydrocodone    Sexual activity: Not Currently    Birth control/protection: Surgical    Comment: Hysterectomy  Other Topics Concern   Not on file  Social History Narrative   Patient is a widow.   Lives in an Apt. 1 son lives in [place] the other 4 adult children live in [place] .   Continues to smoke daily, occasional alcohol no drug use   Social Drivers of Corporate Investment Banker Strain: Low Risk  (01/21/2024)   Overall Financial Resource Strain (CARDIA)    Difficulty of Paying Living Expenses: Not hard at all  Food Insecurity: No Food Insecurity (01/21/2024)   Hunger Vital Sign    Worried About Running Out of Food in the Last Year: Never true    Ran Out of Food in the Last Year: Never true  Transportation Needs: No Transportation Needs (  01/21/2024)   PRAPARE - Administrator, Civil Service (Medical): No    Lack of Transportation (Non-Medical): No  Physical Activity: Inactive (01/21/2024)   Exercise Vital Sign    Days of Exercise per Week: 0 days    Minutes of Exercise per Session: 0 min  Stress: No Stress Concern Present (01/21/2024)   [employer] of Occupational Health - Occupational Stress Questionnaire    Feeling of Stress: Not at all  Social Connections: Moderately Isolated (01/21/2024)   Social Connection and Isolation Panel    Frequency of Communication with Friends and Family: More than three times a week    Frequency of Social Gatherings with Friends and Family: More than three times a week    Attends Religious Services: More than 4 times per year    Active Member of [employer]  or Organizations: No    Attends Banker Meetings: Never    Marital Status: Widowed   Past Surgical History:  Procedure Laterality Date   ABDOMINAL HYSTERECTOMY  1994   APPENDECTOMY  1964   BLADDER SUSPENSION     CESAREAN SECTION     x 3   CHOLECYSTECTOMY  1995   COLONOSCOPY  2017   COLONOSCOPY W/ BIOPSIES AND POLYPECTOMY  01/24/2011   (Crohn's)ileitis, internal hemorrhoids   ECTOPIC PREGNANCY SURGERY     ESOPHAGOGASTRODUODENOSCOPY     FACIAL COSMETIC SURGERY     HAND SURGERY Bilateral 1991   x 2   KNEE SURGERY Right 1981   LYSIS OF ADHESION  2010   ex lap/LOA for SBO Dr Mikell   ORBITAL FRACTURE SURGERY  1990   RIGHT COLECTOMY  03/2009   Right colectomy for colon CA.  Dr Ebbie   TONSILLECTOMY  1952   TOTAL KNEE ARTHROPLASTY  03/2010   Dr Ernie.  Depuy   UPPER GASTROINTESTINAL ENDOSCOPY  05/16/2010   normal   UTERINE SUSPENSION     VIDEO BRONCHOSCOPY Bilateral 12/18/2014   Procedure: VIDEO BRONCHOSCOPY WITHOUT FLUORO;  Surgeon: Ozell KATHEE America, MD;  Location: [facility];  Service: Cardiopulmonary;  Laterality: Bilateral;   Past Surgical History:  Procedure Laterality Date   ABDOMINAL HYSTERECTOMY  1994   APPENDECTOMY  1964   BLADDER SUSPENSION     CESAREAN SECTION     x 3   CHOLECYSTECTOMY  1995   COLONOSCOPY  2017   COLONOSCOPY W/ BIOPSIES AND POLYPECTOMY  01/24/2011   (Crohn's)ileitis, internal hemorrhoids   ECTOPIC PREGNANCY SURGERY     ESOPHAGOGASTRODUODENOSCOPY     FACIAL COSMETIC SURGERY     HAND SURGERY Bilateral 1991   x 2   KNEE SURGERY Right 1981   LYSIS OF ADHESION  2010   ex lap/LOA for SBO Dr Mikell   ORBITAL FRACTURE SURGERY  1990   RIGHT COLECTOMY  03/2009   Right colectomy for colon CA.  Dr Ebbie   TONSILLECTOMY  1952   TOTAL KNEE ARTHROPLASTY  03/2010   Dr Ernie.  Depuy   UPPER GASTROINTESTINAL ENDOSCOPY  05/16/2010   normal   UTERINE SUSPENSION     VIDEO BRONCHOSCOPY Bilateral 12/18/2014   Procedure: VIDEO  BRONCHOSCOPY WITHOUT FLUORO;  Surgeon: Ozell KATHEE America, MD;  Location: [facility];  Service: Cardiopulmonary;  Laterality: Bilateral;   Past Medical History:  Diagnosis Date   Acute duodenitis 04/24/2017   Acute hypoxic respiratory failure (HCC) 12/27/2022   Allergy    ANEMIA    Anxiety    BACK PAIN, LUMBAR    Cancer of ascending  colon pTispN0 s/p colectomy 03/05/2009 02/18/2010   Qualifier: Diagnosis of  By: Avram MD, NOLIA Pitts E    CHF (congestive heart failure) (HCC)    Complete rotator cuff tear of left shoulder 11/27/2013   Ultrasound guided injection on November 27, 2013    COPD (chronic obstructive pulmonary disease) with emphysema (HCC)    COPD exacerbation (HCC) 11/14/2017   Crohn's 02/2010   ileal ulcers, intol of entercort--refuses treatment   Emphysema of lung (HCC)    GERD    History of blood transfusion    w/ c/s surgery   History of transfusion of whole blood    HYPERLIPIDEMIA    HYPERTENSION    Microscopic hematuria    chronic   Myocardial infarction (HCC) 2010   Narcotic dependence (HCC) 05/25/2017   Obstruction of intestine or colon (HCC)    adhesions   OSTEOARTHRITIS    Pneumonia due to infectious organism 09/18/2014   Onset of symptoms 07/2014   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  - FOB 12/18/14 > min narrowing > BAL: cytology negative   - f/u cxr 03/22/2015 improved > f/u CT abd 06/28/2015          Rectal fissure    Possible fissure   Rotator cuff tear    SBO (small bowel obstruction) (HCC) 02/14/2017   Takotsubo syndrome 12/2008   URINARY INCONTINENCE    LMP  (LMP Unknown)   Opioid Risk Score:   Fall Risk Score:  `1  Depression screen PHQ 2/9     01/21/2024    1:16 PM 11/19/2023    9:09 AM 08/21/2023    8:43 AM 08/21/2023    8:41 AM 05/28/2023    9:11 AM 02/28/2023    9:10 AM 01/19/2023    1:41 PM  Depression screen PHQ 2/9  Decreased Interest 0 0  0 0 0 0  Down, Depressed, Hopeless 0 0 0 0 0 0 0  PHQ - 2 Score 0 0 0 0 0 0 0  Altered  sleeping  0 1 1     Tired, decreased energy  0 1 1     Change in appetite  0 0 0     Feeling bad or failure about yourself   0 0 0     Trouble concentrating  0 0 0     Moving slowly or fidgety/restless  0 0 0     Suicidal thoughts  0 0 0     PHQ-9 Score  0  2  2      Difficult doing work/chores  Somewhat difficult Somewhat difficult Somewhat difficult        Data saved with a previous flowsheet row definition     Review of Systems  Musculoskeletal:  Positive for back pain.       B/L shoulder, hip pain  All other systems reviewed and are negative.      Objective:   Physical Exam Vitals and nursing note reviewed.  Cardiovascular:     Rate and Rhythm: Normal rate and regular rhythm.     Pulses: Normal pulses.     Heart sounds: Normal heart sounds.  Pulmonary:     Effort: Pulmonary effort is normal.     Breath sounds: Normal breath sounds.  Musculoskeletal:     Comments: Normal Muscle Bulk and Muscle Testing Reveals:  Upper Extremities: Decreased ROM and Muscle Strength 5/5 Bilateral AC Joint Tenderness: R>L Thoracic and Lumbar Hypersensitivity Lower Extremities: Full ROM and Muscle Strength  5/5    Skin:    General: Skin is warm and dry.  Neurological:     Mental Status: She is oriented to person, place, and time.  Psychiatric:        Mood and Affect: Mood normal.        Behavior: Behavior normal.           Assessment & Plan:  Bilateral Shoulder Pain: R>L: Continue HEP as Tolerated.  Lumbar Radiculitis: Per Dr Emeline note: Erica [name] failed Gabapentin . Continue Current medication regimen. Continue to Monitor. 02/21/2024 Bilateral Chronic Knee Pain: Continue HEP as Tolerated. Continue to Monitor. 02/21/2024 3. Chronic Pain Syndrome. Refilled: Hydrocodone  5/325 mg one tablet twice a day as needed for pain #60.  Following Months Prescriptions sent to pharmacy, to accommodate scheduled appointment. We will continue the opioid monitoring program, this consists of  regular clinic visits, examinations, urine drug screen, pill counts as well as use of [place]  Controlled Substance Reporting system. A 12 month History has been reviewed on the [place]  Controlled Substance Reporting System on 02/21/2024 4. Uncontrolled Hypertension: Blood Pressure was rechecked. She refuses ED or Urgent Care evaluation. She states she didn't take her anti-hypertensive medication today, educated on medication compliance, she verbalizes understanding.  F/U in 3 months.

## 2024-02-23 ENCOUNTER — Other Ambulatory Visit: Payer: Self-pay | Admitting: Nurse Practitioner

## 2024-02-23 DIAGNOSIS — J441 Chronic obstructive pulmonary disease with (acute) exacerbation: Secondary | ICD-10-CM

## 2024-02-25 NOTE — Telephone Encounter (Signed)
 Requesting: Albuterol  Sulfate HFA 108 (90 Base) MCG/ACT Inhalation Aerosol Solution  Last Visit: 01/01/2024 Next Visit: 04/07/2024 Last Refill: 01/24/2024  Please Advise

## 2024-03-01 ENCOUNTER — Other Ambulatory Visit: Payer: Self-pay | Admitting: Nurse Practitioner

## 2024-03-03 NOTE — Telephone Encounter (Signed)
 Requesting: Cyclobenzaprine  HCl 10 MG Oral Tablet  Last Visit: 01/01/2024 Next Visit: 04/07/2024 Last Refill: 11/21/2022  Please Advise

## 2024-03-06 ENCOUNTER — Other Ambulatory Visit (HOSPITAL_COMMUNITY): Payer: Self-pay

## 2024-03-06 ENCOUNTER — Telehealth: Payer: Self-pay

## 2024-03-06 NOTE — Telephone Encounter (Signed)
 Pharmacy Patient Advocate Encounter   Received notification from Onbase that prior authorization for Cyclobenzaprine  HCl 10MG  tablets  is required/requested.   Insurance verification completed.   The patient is insured through [employer].   Per test claim: PA required; PA submitted to above mentioned insurance via Latent Key/confirmation #/EOC [id number] Status is pending

## 2024-03-07 ENCOUNTER — Other Ambulatory Visit (HOSPITAL_COMMUNITY): Payer: Self-pay

## 2024-03-07 NOTE — Telephone Encounter (Signed)
 Pharmacy Patient Advocate Encounter  Received notification from [employer] that Prior Authorization for Cyclobenzaprine  HCl 10MG  tablets has been APPROVED from 04/04/23 to 04/02/25. Ran test claim, Copay is $1.59. This test claim was processed through [facility]- copay amounts may vary at other pharmacies due to pharmacy/plan contracts, or as the patient moves through the different stages of their insurance plan.   PA #/Case ID/Reference #: [id number]

## 2024-03-07 NOTE — Telephone Encounter (Signed)
 Left message that prior auth of Cyclobenzaprine  was approved.

## 2024-03-08 ENCOUNTER — Other Ambulatory Visit: Payer: Self-pay | Admitting: Nurse Practitioner

## 2024-03-08 DIAGNOSIS — J441 Chronic obstructive pulmonary disease with (acute) exacerbation: Secondary | ICD-10-CM

## 2024-03-10 NOTE — Telephone Encounter (Signed)
 Requesting: Albuterol  Sulfate HFA 108 (90 Base) MCG/ACT Inhalation Aerosol Solution  Last Visit: 01/01/2024 Next Visit: 04/07/2024 Last Refill: 02/25/2024  Please Advise

## 2024-03-12 DIAGNOSIS — F064 Anxiety disorder due to known physiological condition: Secondary | ICD-10-CM | POA: Diagnosis not present

## 2024-03-24 ENCOUNTER — Other Ambulatory Visit: Payer: Self-pay | Admitting: Nurse Practitioner

## 2024-03-25 NOTE — Telephone Encounter (Signed)
 Requesting: Vitamin D  (Ergocalciferol ) 1.25 MG (50000 UT) Oral Capsule  Last Visit: 01/01/2024 Next Visit: 04/07/2024 Last Refill: 11/22/2023  Please Advise    Per note from 11/22/2023. Patient prefers Rx and not take an OTC Vitamin

## 2024-04-07 ENCOUNTER — Ambulatory Visit: Admitting: Nurse Practitioner

## 2024-04-25 ENCOUNTER — Other Ambulatory Visit: Payer: Self-pay | Admitting: Nurse Practitioner

## 2024-04-25 NOTE — Telephone Encounter (Signed)
 Requesting: hydrOXYzine  HCl 10 MG Oral Tablet  Last Visit: 01/01/2024 Next Visit: Visit date not found Last Refill: 01/01/2024  Please Advise

## 2024-05-06 NOTE — Progress Notes (Unsigned)
 " Erica [name] [facility] [address] [place] [address] Phone: [phone] Subjective:   Erica [name], am serving as a scribe for Dr. Arthea [name].  I'm seeing this patient by the request  of:  McElwee, [name], NP  CC: right middle finger, knee pain, shoulder pain  YEP:Dlagzrupcz  02/06/2024 Chronic problem with exacerbation.  Has responded well to injections in the past.  Hopeful that this will make a difference again.  Allow her to still be independent.  Considering to move in with her son in the relatively near future.  Follow-up with me again in 2 to 3 months.     Chronic problem with exacerbation but does respond every 3 months.  Patient still wants to avoid any surgical intervention.  Hopeful of this.  Patient is a relatively high risk surgical patient secondary to comorbidities including pulmonary disease.  Discussed with patient that this is a in office procedure as well if needed.  Follow-up with me again in 3 weeks     Update 05/08/2024 Erica [name] is a 74 y.o. female coming in with complaint of R middle finger, L knee and R AC joint pain. Patient states shoulder is not doing the best. Fingers are also hurting her. Has fallen on L knee twice since last appointment.   Patient has been seen by pain management and given refill for hydrocodone .    Past Medical History:  Diagnosis Date   Acute duodenitis 04/24/2017   Acute hypoxic respiratory failure (HCC) 12/27/2022   Allergy    ANEMIA    Anxiety    BACK PAIN, LUMBAR    Cancer of ascending colon [id number] s/p colectomy 03/05/2009 02/18/2010   Qualifier: Diagnosis of  By: Avram MD, NOLIA Pitts E    CHF (congestive heart failure) (HCC)    Complete rotator cuff tear of left shoulder 11/27/2013   Ultrasound guided injection on November 27, 2013    COPD (chronic obstructive pulmonary disease) with emphysema (HCC)    COPD exacerbation (HCC) 11/14/2017   Crohn's 02/2010   ileal ulcers, intol of  entercort--refuses treatment   Emphysema of lung (HCC)    GERD    History of blood transfusion    w/ c/s surgery   History of transfusion of whole blood    HYPERLIPIDEMIA    HYPERTENSION    Microscopic hematuria    chronic   Myocardial infarction (HCC) 2010   Narcotic dependence (HCC) 05/25/2017   Obstruction of intestine or colon (HCC)    adhesions   OSTEOARTHRITIS    Pneumonia due to infectious organism 09/18/2014   Onset of symptoms 07/2014   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  - FOB 12/18/14 > min narrowing > BAL: cytology negative   - f/u cxr 03/22/2015 improved > f/u CT abd 06/28/2015          Rectal fissure    Possible fissure   Rotator cuff tear    SBO (small bowel obstruction) (HCC) 02/14/2017   Takotsubo syndrome 12/2008   URINARY INCONTINENCE    Past Surgical History:  Procedure Laterality Date   ABDOMINAL HYSTERECTOMY  1994   APPENDECTOMY  1964   BLADDER SUSPENSION     CESAREAN SECTION     x 3   CHOLECYSTECTOMY  1995   COLONOSCOPY  2017   COLONOSCOPY W/ BIOPSIES AND POLYPECTOMY  01/24/2011   (Crohn's)ileitis, internal hemorrhoids   ECTOPIC PREGNANCY SURGERY     ESOPHAGOGASTRODUODENOSCOPY     FACIAL COSMETIC  SURGERY     HAND SURGERY Bilateral 1991   x 2   KNEE SURGERY Right 1981   LYSIS OF ADHESION  2010   ex lap/LOA for SBO Dr Mikell   ORBITAL FRACTURE SURGERY  1990   RIGHT COLECTOMY  03/2009   Right colectomy for colon CA.  Dr Ebbie   TONSILLECTOMY  1952   TOTAL KNEE ARTHROPLASTY  03/2010   Dr Ernie.  Depuy   UPPER GASTROINTESTINAL ENDOSCOPY  05/16/2010   normal   UTERINE SUSPENSION     VIDEO BRONCHOSCOPY Bilateral 12/18/2014   Procedure: VIDEO BRONCHOSCOPY WITHOUT FLUORO;  Surgeon: Ozell KATHEE America, MD;  Location: [facility];  Service: Cardiopulmonary;  Laterality: Bilateral;   Social History   Socioeconomic History   Marital status: Widowed    Spouse name: Not on file   Number of children: 5   Years of education: 14   Highest education  level: High school graduate  Occupational History   Occupation: Retired     Associate Professor: UNEMPLOYED  Tobacco Use   Smoking status: Every Day    Current packs/day: 1.00    Average packs/day: 1 pack/day for 53.0 years (53.0 ttl pk-yrs)    Types: Cigarettes    Passive exposure: Never   Smokeless tobacco: Never   Tobacco comments:    Smokes 0.5 packs of cigarettes daily ARJ 08/22/22  Vaping Use   Vaping status: Never Used  Substance and Sexual Activity   Alcohol use: Not Currently    Comment: occasional mixed drink   Drug use: Yes    Types: Hydrocodone    Sexual activity: Not Currently    Birth control/protection: Surgical    Comment: Hysterectomy  Other Topics Concern   Not on file  Social History Narrative   Patient is a widow.   Lives in an Apt. 1 son lives in [place] the other 4 adult children live in [place] .   Continues to smoke daily, occasional alcohol no drug use   Social Drivers of Health   Tobacco Use: High Risk (02/06/2024)   Patient History    Smoking Tobacco Use: Every Day    Smokeless Tobacco Use: Never    Passive Exposure: Never  Financial Resource Strain: Low Risk (01/21/2024)   Overall Financial Resource Strain (CARDIA)    Difficulty of Paying Living Expenses: Not hard at all  Food Insecurity: No Food Insecurity (01/21/2024)   Epic    Worried About Programme Researcher, Broadcasting/film/video in the Last Year: Never true    Ran Out of Food in the Last Year: Never true  Transportation Needs: No Transportation Needs (01/21/2024)   Epic    Lack of Transportation (Medical): No    Lack of Transportation (Non-Medical): No  Physical Activity: Inactive (01/21/2024)   Exercise Vital Sign    Days of Exercise per Week: 0 days    Minutes of Exercise per Session: 0 min  Stress: No Stress Concern Present (01/21/2024)   [employer] of Occupational Health - Occupational Stress Questionnaire    Feeling of Stress: Not at all  Social Connections: Moderately Isolated (01/21/2024)   Social  Connection and Isolation Panel    Frequency of Communication with Friends and Family: More than three times a week    Frequency of Social Gatherings with Friends and Family: More than three times a week    Attends Religious Services: More than 4 times per year    Active Member of [employer] or Organizations: No    Attends Banker Meetings: Never  Marital Status: Widowed  Depression (PHQ2-9): Low Risk (01/21/2024)   Depression (PHQ2-9)    PHQ-2 Score: 0  Alcohol Screen: Low Risk (01/21/2024)   Alcohol Screen    Last Alcohol Screening Score (AUDIT): 0  Housing: Unknown (01/21/2024)   Epic    Unable to Pay for Housing in the Last Year: No    Number of Times Moved in the Last Year: Not on file    Homeless in the Last Year: No  Utilities: Not At Risk (01/21/2024)   Epic    Threatened with loss of utilities: No  Health Literacy: Adequate Health Literacy (01/21/2024)   B1300 Health Literacy    Frequency of need for help with medical instructions: Never   Allergies[1] Family History  Problem Relation Age of Onset   Throat cancer Mother    Colon cancer Father 35   Hypertension Father    Heart disease Father    Kidney disease Father    Colon cancer Sister 57   Liver cancer Sister    Other Sister        amyloidosis   Arthritis Other        Parent, other relative   Stomach cancer Neg Hx    Rectal cancer Neg Hx    Breast cancer Neg Hx     Current Outpatient Medications (Cardiovascular):    metoprolol  tartrate (LOPRESSOR ) 25 MG tablet, Take 1 tablet (25 mg total) by mouth 2 (two) times daily.   rosuvastatin  (CRESTOR ) 10 MG tablet, TAKE 1 TABLET BY MOUTH ON MONDAY, WEDNESDAY AND FRIDAY  Current Outpatient Medications (Respiratory):    albuterol  (PROVENTIL ) (2.5 MG/3ML) 0.083% nebulizer solution, Take 3 mLs (2.5 mg total) by nebulization every 6 (six) hours as needed for wheezing or shortness of breath.   albuterol  (VENTOLIN  HFA) 108 (90 Base) MCG/ACT inhaler, INHALE 2  PUFFS BY MOUTH EVERY 4 HOURS AS NEEDED FOR WHEEZING OR SHORTNESS OF BREATH   fluticasone  (FLONASE ) 50 MCG/ACT nasal spray, Place 2 sprays into both nostrils daily.  Current Outpatient Medications (Analgesics):    aspirin  EC 81 MG tablet, Take 81 mg by mouth at bedtime.  Current Outpatient Medications (Other):    cyclobenzaprine  (FLEXERIL ) 10 MG tablet, Take 1 tablet by mouth three times daily as needed for muscle spasm   hydrOXYzine  (ATARAX ) 10 MG tablet, TAKE 1 TABLET BY MOUTH THREE TIMES DAILY AS NEEDED FOR ANXIETY   Vitamin D , Ergocalciferol , (DRISDOL ) 1.25 MG (50000 UNIT) CAPS capsule, Take 1 capsule by mouth once a week   Reviewed prior external information including notes and imaging from  primary care provider As well as notes that were available from care everywhere and other healthcare systems.  Past medical history, social, surgical and family history all reviewed in electronic medical record.  No pertanent information unless stated regarding to the chief complaint.   Review of Systems:  No headache, visual changes, nausea, vomiting, diarrhea, constipation, dizziness, abdominal pain, skin rash, fevers, chills, night sweats, weight loss, swollen lymph nodes, body aches, joint swelling, chest pain, shortness of breath, mood changes. POSITIVE muscle aches  Objective  Blood pressure (!) 148/90, pulse 73, height 5' 2 (1.575 m), weight 110 lb (49.9 kg), SpO2 93%.   General: No apparent distress alert and oriented x3 mood and affect normal, dressed appropriately.  HEENT: Pupils equal, extraocular movements intact  Respiratory: Patient's speak in full sentences and does not appear short of breath  Cardiovascular: No lower extremity edema, non tender, no erythema  Left knee no significant contusion  noted.  Patient has good range of motion.  Patient is relatively significant VMO wasting noted. Right shoulder does have atrophy noted.  Patient does have some limited range of motion noted.   Patient's weakness of the rotator cuff 3 out of 5 strength.  Procedure: Real-time Ultrasound Guided Injection of right glenohumeral joint Device: GE Logiq Q7  Ultrasound guided injection is preferred based studies that show increased duration, increased effect, greater accuracy, decreased procedural pain, increased response rate with ultrasound guided versus blind injection.  Verbal informed consent obtained.  Time-out conducted.  Noted no overlying erythema, induration, or other signs of local infection.  Skin prepped in a sterile fashion.  Local anesthesia: Topical Ethyl chloride.  With sterile technique and under real time ultrasound guidance:  Joint visualized.  23g 1  inch needle inserted posterior approach. Pictures taken for needle placement. Patient did have injection of 2 cc of 1% lidocaine , 2 cc of 0.5% Marcaine, and 1.0 cc of Kenalog  40 mg/dL. Completed without difficulty  Pain immediately resolved suggesting accurate placement of the medication.  Advised to call if fevers/chills, erythema, induration, drainage, or persistent bleeding.  Impression: Technically successful ultrasound guided injection.  Procedure: Real-time Ultrasound Guided Injection of right acromioclavicular joint Device: GE Logiq Q7 Ultrasound guided injection is preferred based studies that show increased duration, increased effect, greater accuracy, decreased procedural pain, increased response rate, and decreased cost with ultrasound guided versus blind injection.  Verbal informed consent obtained.  Time-out conducted.  Noted no overlying erythema, induration, or other signs of local infection.  Skin prepped in a sterile fashion.  Local anesthesia: Topical Ethyl chloride.  With sterile technique and under real time ultrasound guidance: With a 25-gauge half inch needle injected with 0.5 cc of 0.5% Marcaine and 0.5 cc of Kenalog  40 mg/mL Completed without difficulty  Pain immediately resolved suggesting  accurate placement of the medication.  Advised to call if fevers/chills, erythema, induration, drainage, or persistent bleeding.  Impression: Technically successful ultrasound guided injection.   Impression and Recommendations:     The above documentation has been reviewed and is accurate and complete Arthea CHRISTELLA Sharps, DO       [1]  Allergies Allergen Reactions   Iodinated Contrast Media Itching and Other (See Comments)    She had some itching after lumbar epidural injection in ipsilateral lower extremity 12/07/22.  Today, she had itching in her ipsilateral upper extremity after a cervical epidural injection.(12/21/2022)   Morphine Nausea And Vomiting   "

## 2024-05-08 ENCOUNTER — Ambulatory Visit: Admitting: Family Medicine

## 2024-05-08 ENCOUNTER — Ambulatory Visit

## 2024-05-08 ENCOUNTER — Other Ambulatory Visit: Payer: Self-pay

## 2024-05-08 VITALS — BP 148/90 | HR 73 | Ht 62.0 in | Wt 110.0 lb

## 2024-05-08 DIAGNOSIS — M25562 Pain in left knee: Secondary | ICD-10-CM

## 2024-05-08 DIAGNOSIS — M19011 Primary osteoarthritis, right shoulder: Secondary | ICD-10-CM

## 2024-05-08 DIAGNOSIS — M65331 Trigger finger, right middle finger: Secondary | ICD-10-CM

## 2024-05-08 DIAGNOSIS — M150 Primary generalized (osteo)arthritis: Secondary | ICD-10-CM

## 2024-05-08 DIAGNOSIS — G8929 Other chronic pain: Secondary | ICD-10-CM

## 2024-05-08 DIAGNOSIS — M12811 Other specific arthropathies, not elsewhere classified, right shoulder: Secondary | ICD-10-CM

## 2024-05-08 DIAGNOSIS — M5010 Cervical disc disorder with radiculopathy, unspecified cervical region: Secondary | ICD-10-CM

## 2024-05-08 DIAGNOSIS — G894 Chronic pain syndrome: Secondary | ICD-10-CM

## 2024-05-08 DIAGNOSIS — M25511 Pain in right shoulder: Secondary | ICD-10-CM

## 2024-05-08 MED ORDER — METHYLPREDNISOLONE ACETATE 80 MG/ML IJ SUSP
80.0000 mg | Freq: Once | INTRAMUSCULAR | Status: AC
  Administered 2024-05-08: 80 mg via INTRAMUSCULAR

## 2024-05-08 MED ORDER — KETOROLAC TROMETHAMINE 60 MG/2ML IM SOLN
60.0000 mg | Freq: Once | INTRAMUSCULAR | Status: AC
  Administered 2024-05-08: 60 mg via INTRAMUSCULAR

## 2024-05-08 NOTE — Assessment & Plan Note (Signed)
 Patient given injection today and tolerated the procedure well, discussed icing regimen and home exercises, discussed which activities to do and which ones to avoid.  Increase activity slowly.  Patient does not want any surgical intervention.  Can repeat injection every 3 months if needed.  Would like to reevaluate in 3 months Toradol  and Depo-Medrol  injection given for her other pain.

## 2024-05-08 NOTE — Assessment & Plan Note (Signed)
 Chronic problem with severe arthritis.  Discussed icing regimen and home exercises, discussed which activities to do and which ones to avoid.  Again wants to avoid any type of surgical intervention.  Increase activity as tolerated.  Follow-up with me again in 6 to 8 weeks.

## 2024-05-08 NOTE — Patient Instructions (Addendum)
 Good to see you. Full cocktail injection in backside today. No NSAIDs for 24 hours. Injected the shoulder today Resources through [facility] See me again in  2-3 months

## 2024-05-08 NOTE — Assessment & Plan Note (Signed)
 Chronic problem with exacerbation.  Has responded well to epidurals previously and will need to monitor.  With significant arthritic changes of the right shoulder that I do think contribute to some of the discomfort as well.  Increase activity slowly.  Discussed icing regimen.  Follow-up again in 6 to 12 weeks

## 2024-05-08 NOTE — Assessment & Plan Note (Signed)
 Toradol  and Depo-Medrol  injections given today as well.

## 2024-05-08 NOTE — Assessment & Plan Note (Signed)
 Will see if there is any community resources to help her with some daily activities around the house.

## 2024-05-21 ENCOUNTER — Encounter: Admitting: Physical Medicine and Rehabilitation

## 2024-08-05 ENCOUNTER — Ambulatory Visit: Admitting: Family Medicine

## 2025-01-26 ENCOUNTER — Ambulatory Visit
